# Patient Record
Sex: Female | Born: 1940 | Race: White | Hispanic: No | Smoking: Former smoker
Health system: Southern US, Community
[De-identification: ages and names within clinical notes are randomized; demographics above are authoritative.]

## PROBLEM LIST (undated history)

## (undated) DIAGNOSIS — H8113 Benign paroxysmal vertigo, bilateral: Secondary | ICD-10-CM

## (undated) DIAGNOSIS — R519 Headache, unspecified: Secondary | ICD-10-CM

## (undated) DIAGNOSIS — F32A Depression, unspecified: Secondary | ICD-10-CM

## (undated) DIAGNOSIS — I639 Cerebral infarction, unspecified: Secondary | ICD-10-CM

## (undated) DIAGNOSIS — J45909 Unspecified asthma, uncomplicated: Secondary | ICD-10-CM

## (undated) DIAGNOSIS — E039 Hypothyroidism, unspecified: Secondary | ICD-10-CM

## (undated) DIAGNOSIS — N189 Chronic kidney disease, unspecified: Secondary | ICD-10-CM

## (undated) DIAGNOSIS — G4700 Insomnia, unspecified: Secondary | ICD-10-CM

## (undated) DIAGNOSIS — F329 Major depressive disorder, single episode, unspecified: Secondary | ICD-10-CM

## (undated) DIAGNOSIS — M1990 Unspecified osteoarthritis, unspecified site: Secondary | ICD-10-CM

## (undated) DIAGNOSIS — K259 Gastric ulcer, unspecified as acute or chronic, without hemorrhage or perforation: Secondary | ICD-10-CM

## (undated) DIAGNOSIS — M722 Plantar fascial fibromatosis: Secondary | ICD-10-CM

## (undated) DIAGNOSIS — M549 Dorsalgia, unspecified: Secondary | ICD-10-CM

## (undated) DIAGNOSIS — E559 Vitamin D deficiency, unspecified: Secondary | ICD-10-CM

## (undated) HISTORY — DX: Insomnia, unspecified: G47.00

## (undated) HISTORY — PX: BACK SURGERY: SHX140

## (undated) HISTORY — DX: Major depressive disorder, single episode, unspecified: F32.9

## (undated) HISTORY — DX: Plantar fascial fibromatosis: M72.2

## (undated) HISTORY — DX: Hypothyroidism, unspecified: E03.9

## (undated) HISTORY — DX: Depression, unspecified: F32.A

## (undated) HISTORY — DX: Cerebral infarction, unspecified: I63.9

## (undated) HISTORY — DX: Vitamin D deficiency, unspecified: E55.9

## (undated) HISTORY — PX: APPENDECTOMY: SHX54

---
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
## 1972-12-10 HISTORY — PX: ABDOMINAL HYSTERECTOMY: SHX81

## 1999-01-23 ENCOUNTER — Other Ambulatory Visit: Admission: RE | Admit: 1999-01-23 | Discharge: 1999-01-23 | Payer: Self-pay | Admitting: *Deleted

## 1999-12-16 ENCOUNTER — Encounter (INDEPENDENT_AMBULATORY_CARE_PROVIDER_SITE_OTHER): Payer: Self-pay | Admitting: *Deleted

## 1999-12-17 ENCOUNTER — Inpatient Hospital Stay (HOSPITAL_COMMUNITY): Admission: EM | Admit: 1999-12-17 | Discharge: 1999-12-20 | Payer: Self-pay | Admitting: Podiatry

## 2000-06-25 ENCOUNTER — Other Ambulatory Visit: Admission: RE | Admit: 2000-06-25 | Discharge: 2000-06-25 | Payer: Self-pay | Admitting: *Deleted

## 2001-12-10 HISTORY — PX: HAND SURGERY: SHX662

## 2005-06-11 ENCOUNTER — Inpatient Hospital Stay (HOSPITAL_COMMUNITY): Admission: EM | Admit: 2005-06-11 | Discharge: 2005-06-13 | Payer: Self-pay | Admitting: Emergency Medicine

## 2005-12-10 HISTORY — PX: SHOULDER ARTHROSCOPY: SHX128

## 2006-11-13 ENCOUNTER — Emergency Department (HOSPITAL_COMMUNITY): Admission: EM | Admit: 2006-11-13 | Discharge: 2006-11-13 | Payer: Self-pay | Admitting: Emergency Medicine

## 2007-10-20 ENCOUNTER — Other Ambulatory Visit: Admission: RE | Admit: 2007-10-20 | Discharge: 2007-10-20 | Payer: Self-pay | Admitting: *Deleted

## 2007-12-11 HISTORY — PX: PLANTAR FASCIA SURGERY: SHX746

## 2008-07-29 ENCOUNTER — Encounter: Admission: RE | Admit: 2008-07-29 | Discharge: 2008-07-29 | Payer: Self-pay | Admitting: Orthopedic Surgery

## 2010-12-17 ENCOUNTER — Emergency Department (HOSPITAL_COMMUNITY)
Admission: EM | Admit: 2010-12-17 | Discharge: 2010-12-18 | Payer: Self-pay | Source: Home / Self Care | Admitting: Emergency Medicine

## 2011-02-06 ENCOUNTER — Encounter (HOSPITAL_BASED_OUTPATIENT_CLINIC_OR_DEPARTMENT_OTHER)
Admission: RE | Admit: 2011-02-06 | Discharge: 2011-02-06 | Disposition: A | Discharge status: Home / Self Care | Payer: Medicare Other | Source: Ambulatory Visit | Attending: Orthopedic Surgery | Admitting: Orthopedic Surgery

## 2011-02-07 ENCOUNTER — Ambulatory Visit (HOSPITAL_BASED_OUTPATIENT_CLINIC_OR_DEPARTMENT_OTHER)
Admission: RE | Admit: 2011-02-07 | Discharge: 2011-02-07 | Disposition: A | Discharge status: Home / Self Care | Payer: Medicare Other | Source: Ambulatory Visit | Attending: Orthopedic Surgery | Admitting: Orthopedic Surgery

## 2011-02-07 DIAGNOSIS — Z472 Encounter for removal of internal fixation device: Secondary | ICD-10-CM | POA: Insufficient documentation

## 2011-02-07 DIAGNOSIS — Z01812 Encounter for preprocedural laboratory examination: Secondary | ICD-10-CM | POA: Insufficient documentation

## 2011-02-07 DIAGNOSIS — Z01818 Encounter for other preprocedural examination: Secondary | ICD-10-CM | POA: Insufficient documentation

## 2011-02-07 LAB — POCT HEMOGLOBIN-HEMACUE: Hemoglobin: 13.5 g/dL (ref 12.0–15.0)

## 2011-03-07 NOTE — Op Note (Signed)
  Leslie [name], STOHR             ACCOUNT NO.:  [id number]  MEDICAL RECORD NO.:  [id number]          PATIENT TYPE:  LOCATION:                                 FACILITY:  PHYSICIAN:  Artist Pais. Fortunata [name], M.D.DATE OF BIRTH:  1941-11-02  DATE OF PROCEDURE:  02/07/2011 DATE OF DISCHARGE:                              OPERATIVE REPORT   PREOPERATIVE DIAGNOSIS:  Chronic pain, status post fall with retained hardware left fifth metacarpal base.  POSTOPERATIVE DIAGNOSIS:  Chronic pain, status post fall with retained hardware left fifth metacarpal base.  PROCEDURE:  Hardware removal left fifth metacarpal.  SURGEON:  Tanisa Lagace A. Mina [name], MD  ASSISTANT:  None.  ANESTHESIA:  General.  TOURNIQUET TIME:  36 minutes.  No complications.  No drains.  The patient was taken to the operating suite after induction of adequate general anesthesia, left upper extremity was prepped and draped in usual sterile fashion.  An Esmarch was used to exsanguinate the limb. Tourniquet was then inflated to 250 mmHg.  At this point time, incision was made longitudinally over the fifth metacarpal base of the [facility] joint left side, the skin was incised.  The fifth dorsal compartment tendons were carefully retracted with a small leather sheath incised. Dissection was carried down to the base of fifth metacarpal. Fluoroscopy was used to identify the insertion point of the IM rod, it was removed.  The wound was irrigated and loosely closed in layers of 4- 0 Vicryl for the retinaculum over the fifth dorsal compartment and the capsule, and 5-0 nylon suture for the skin.  Xeroform, 4x4s, and compressive dressing were applied.  The patient tolerated the procedure well and went to recovery room in stable condition.     Artist Pais Mina [name], [name]     MAW/MEDQ  D:  02/07/2011  T:  02/07/2011  Job:  [id number]  Electronically Signed by Dairl Ponder [name] on 03/07/2011 11:27:37 AM

## 2011-04-27 NOTE — Op Note (Signed)
NAMEAYLENE, ACOFF             ACCOUNT NO.:  [id number]   MEDICAL RECORD NO.:  [id number]          PATIENT TYPE:  INP   LOCATION:  5025                         FACILITY:  [facility]   PHYSICIAN:  Artist Pais. Weingold, M.D.DATE OF BIRTH:  07-17-1941   DATE OF PROCEDURE:  06/11/2005  DATE OF DISCHARGE:  06/13/2005                                 OPERATIVE REPORT   PREOPERATIVE DIAGNOSIS:  Left hand fifth metacarpal open fracture.   POSTOPERATIVE DIAGNOSIS:  Left hand fifth metacarpal open fracture.   PROCEDURE:  I&D and IM rodding above with 1.6 mm IM rod.   SURGEON:  Artist Pais. Mina [name], MD.   ASSISTANT:  None.   ANESTHESIA:  General.   TOURNIQUET TIME:  20 minutes.   CULTURES:  Cultures x2 were sent.   BLOOD LOSS:  None.   DESCRIPTION OF PROCEDURE:  Patient was taken to the operating room.  After  the induction of adequate general anesthesia, the left upper extremity was  prepped and draped in the usual sterile fashion.  An Esmarch was used to  exsanguinate the limb.  The tourniquet was inflated to 250 mmHg.  At this  point in time, the left metacarpophalangeal joint area was irrigated and  debrided secondary to a dog bite and open fracture.  Cultures were taken.  The wound was thoroughly irrigated.  A second incision was made at the base  of the fifth metacarpal.  A small cortical window was opened.  The IM rod  was then driven from proximal to distal across the fracture site under  direct and fluoroscopic guidance.  The wire was cut below the skin and bent  upon itself.  Both wound were thoroughly irrigated and then loosely closed  with 5-0 nylon.  A sterile dressing of Xeroform, 4x4s, and an ulnar-volar  splint.  The patient tolerated the procedure well.      Artist Pais Mina [name], [name]  Electronically Signed     MAW/MEDQ  D:  11/20/2005  T:  11/20/2005  Job:  [id number]

## 2011-04-27 NOTE — Discharge Summary (Signed)
[place]  Patient:    Leslie [name]                   MRN: [id number] Adm. Date:  [id number] Disc. Date: [id number] Attending:  Anastasio Auerbach Dictator:   Anastasio Auerbach, [name] CC:         Roosvelt Harps, [name]             Miguel Aschoff, [name]                           Discharge Summary  DATE OF BIRTH:  1941/03/26  DISCHARGE DIAGNOSES: 1.  Prepyloric ulcer. 2.  Acute GI bleed secondary to #1. 3.  Anemia secondary to #1.     a.  Discharge hemoglobin 9.3. 4.  Hypothyroidism. 5.  Depression. 6.  Postmenopausal.     a.  Hysterectomy secondary to endometriosis. 7.  Status post appendectomy. 8.  History of transfusion with back surgeries. 9.  History of fall from a ladder with back surgeries and fusion. 10. History of CVA/TIA, 1997.     a.  Residual right hand clumsy, ? 11. Macular degeneration. 12. Contact dermatitis secondary to tape. 13. Allergy:  Ultram (disorientation).  DISCHARGE MEDICATIONS: 1.  Protinex 40 mg q.d. x 30 days. 2.  Phenergan 12.5 mg 1/2 tablet q.6h. p.r.n. nausea; dispensed #5 with no     refill. 3.  Continue old medications as follows:     a.  Synthroid 0.088 mg qd     b.  Premarin 0.625 mg q.d.     c.  Zoloft 50 mg q.d.     d.  Tylenol as-needed for pain.  * Do not use aspirin or ibuprofen like products as they can irritate the ulcer.  DISCHARGE CONDITION:  Stable.  Orthostatics lying 123/56 94, sitting 116/84 95,  standing 116/53 96 (asymptomatic).  ACTIVITY:  Recommended activity as tolerated, rest when tired.  DIET:  Recommended diet as tolerated.  Soft foods for the next couple of days.  SPECIAL INSTRUCTIONS:  The patient is to contact her physicians if she has any similar symptoms to this presentation.  That included severe nausea, dizziness ith standing, and black tarry stools.  DISPOSITION:   The patient has an appointment to see Dr. Miguel Aschoff on Monday, December 25, 1999, at 2:00 p.m.  At that visit  she will need her hemoglobin rechecked.  She will also need to be started on iron therapy.  Her stroke prophylaxis medication can be addressed as she will not be allowed to take aspirin at this time.  CONSULTANTS:  Roosvelt Harps, [name]  PROCEDURES: 1.  EGD (December 17, 1999):  Non-bleeding prepyloric ulcer.  HOSPITAL COURSE: #1 - GI BLEED:  This 70 year old Caucasian woman presents with a two to three week history of fatigue and a one day history of nausea and vomiting and associated orthostasis.  She subsequently developed black tarry stools.  She presented to he emergency department and indeed was noted to have guaiac positive black stools.  Her hemoglobin on presentation was 11.3.  She denied any significant abdominal pain.  Gastroenterology was consulted.  Endoscopy was performed, which demonstrated a non-bleeding prepyloric ulcer.  Her aspirin was discontinued and she was started on IV Pepcid, which was subsequently changed to Protinex.  Her course was somewhat rocky in that she remained significantly orthostatic and did have her hemoglobin drop to a low of 8.9.  On the  day of discharge it had stabilized at 9.3.  She did not require transfusion this admission.  A clo test was done which showed no evidence of urea and pathology did not demonstrate any Helicobacter pylori. She will contact Dr. Luther Parody if she has any signs of further bleeding.  He instructed her to avoid aspirin.  #2 - HISTORY OF STROKE VERSUS TIA:  This occurred four years ago.  She apparently had some right hand clumsiness.  At this point she cannot be anticoagulated with aspirin.  I do not know the details of her - imaging that was done.  I will defer further investigation into this to her primary care physician, Dr. Miguel Aschoff. DD:  12/20/99 TD:  12/20/99 Job: [id number]

## 2011-04-27 NOTE — Consult Note (Signed)
NAMENAILEA, WHITEHORN             ACCOUNT NO.:  [id number]   MEDICAL RECORD NO.:  [id number]          PATIENT TYPE:  INP   LOCATION:  1828                         FACILITY:  [facility]   PHYSICIAN:  Artist Pais. Weingold, M.D.DATE OF BIRTH:  1941-10-30   DATE OF CONSULTATION:  06/11/2005  DATE OF DISCHARGE:                                   CONSULTATION   REASON FOR CONSULTATION:  Ms. Jacqulyne Gladue is a 70 year old right hand  dominant female who was involved in breaking up her dogs in a dog fight on  this past Friday and presents today with pain and swelling.  An x-ray of  this shows a fracture of the head and neck junction of the fifth metacarpal  drainage and despite being on Augmentin for the past 24 hours.  She is an,  otherwise, healthy 70 year old female with an allergy to AcipHex as well as  having had surgery in the past x 4 on her back and foot surgery as a child.  She also has an allergy to Ultram.  She is currently on Synthroid, Premarin,  and Zoloft.  No significant past medical or surgical history is, otherwise,  noted or family history, etc.   PHYSICAL EXAMINATION:  Well developed, well nourished female, pleasant,  alert and oriented x 3.  Examination of her hand on the left shows pain and  swelling over the metacarpal phalangeal joint of the index, long, ring, and  small fingers, and mostly over the little finger where she has an entrance  wound with some serous type drainage.  She can make a fist.  She does have  pain and swelling which has gotten worse over the 24 hours despite  Augmentin.  She denies numbness and tingling.  Right hand is normal by  comparison.   X-rays show a fracture of the fifth metacarpal head and neck junction with  apex dorsal angulation.   ASSESSMENT:  70 year old female with a dog bite wound to her left hand with  an open fracture of the fifth metacarpal head and neck junction now with  increased swelling and pain despite p.o. antibiotics.   RECOMMENDATIONS:  We had a thorough discussion regarding treatment options.  I recommended we take her to the operating room and we do an incision and  drainage, irrigate the fracture site out completely, affix it with an IM rod  from proximal to distal, and admit her for IV antibiotics for the next 24-48  hours.  The patient understands the risks and benefits of the procedure and  wishes to proceed and will be taken to the operating room emergently for I&D  and IM rodding of fifth metacarpal fracture, left hand.       MAW/MEDQ  D:  06/11/2005  T:  06/11/2005  Job:  [id number]

## 2011-04-27 NOTE — Procedures (Signed)
[place]  Patient:    Leslie [name]                   MRN: [id number] Proc. Date: 12/17/99 Adm. Date:  [id number] Attending:  Lanell Persons CC:         Vikki Ports, [name]                           Procedure Report  PROCEDURE:  Video upper endoscopy.  INDICATIONS:  A 70 year old female with melena and vomiting and vague epigastric discomfort.  PREPARATION:  She was n.p.o.  PREPROCEDURE SEDATION:  She received a total of 40 mg of Demerol and 6 mg of Versed intravenously.  [name], she was on 2 L of nasal cannula O2, and her throat was anesthetized with Hurricaine spray.  DESCRIPTION OF PROCEDURE:  The Olympus video upper endoscope was inserted via the mouth and through the upper esophageal sphincter with ease.  Intubation was then carried out well into the descending duodenum.  On withdrawal, the mucosa was carefully evaluated.  The duodenum and bulb appeared normal.  Within the prepyloric area was a 1 cm non-bleeding ulcer with surrounding gastritis.  The area was biopsied for CLO and histology.  The remainder of the stomach appeared normal.  Retroflexed view of the GE junction was unremarkable, and the distal esophagus appeared normal.  The patient tolerated the procedure well.  Pulse, blood pressure, and oximetry testing were stable throughout.  She was observed in recovery for 5 minutes and returned to the emergency room.  IMPRESSION:  Currently non-bleeding prepyloric gastric ulcer.  PLAN:  Check the CLO test and histology and treat accordingly.  I am placing her on p.o. Protonix.  She may start clear liquids.  Serial hemoglobins will be checked, and she should be observed in the hospital until stable. DD:  12/17/99 TD:  12/17/99 Job: [id number]

## 2011-04-27 NOTE — Consult Note (Signed)
[place]  Patient:    Leslie [name]                   MRN: [id number] Proc. Date: 12/17/99 Adm. Date:  [id number] Attending:  Lanell Persons Dictator:   Roosvelt Harps, [name] CC:         Vikki Ports, [name]                          Consultation Report  HISTORY OF PRESENT ILLNESS:  [name] is a 70 year old female in generally  good health who has been feeling somewhat run down in the last few weeks. Yesterday on December 16, 1999 she began having nausea and vomiting with sensation of extreme diaphoresis, nausea, and weakness on sitting or standing upright. She subsequently had a few episodes of melena.  She has had some vague epigastric discomfort but has not been paying much attention to it.  She has never previously had any gastrointestinal bleeding or history of ulcer disease.  She takes one aspirin a day, generally a baby aspirin but recently an adult aspirin as prophylaxis for a TIA that she had a few years ago.  [name], when she has sinus problems she takes an adult aspirin, which she has been doing recently. he does not drink any alcohol or use any other non-steroidals.  She denies heartburn, dysphagia, weight loss, or lower abdominal pain.  PAST MEDICAL HISTORY:  Pertinent for an appendectomy and a hysterectomy for endometriosis.  She also has a history of a fall from a ladder in the 1980s resulting in fractured spine and four spinal fusions.  As noted, she had a TIA r small CVA with minimal right hand clumsiness resulting.  She also has a diagnosis of macular degeneration.  CURRENT MEDICATIONS:  Include Premarin, Zoloft, Synthroid, and aspirin.  ALLERGIES:  Ultram effects her cognitive abilities.  FAMILY HISTORY:  Negative for ulcers, gallstones, inflammatory bowel disease, or gastrointestinal neoplasia.  She says that one brother has had pancreatic problems and a sister has had irritable  bowel.  SOCIAL HISTORY:  She is married.  She does not drink alcohol or smoke.  She is n disability secondary to her spinal problem.  REVIEW OF SYSTEMS:  GENERAL:  No weight loss or night sweats.  ENDOCRINE:  Positive for hypothyroidism.  No history of diabetes.  SKIN:  No rash or pruritus.  EYES:  No icterus or change in vision.  ENT:  No aphthous ulcers or chronic sore throat.  RESPIRATORY:  No shortness of breath, cough, or wheezing.  CARDIAC:  No chest pain, palpitations, or history of valvular heart disease.  GI:  As above.  GU:  Reported past history of microscopic hematuria.  The remainder of the Review Of Systems is negative.  PHYSICAL EXAMINATION:  GENERAL:  She is a well-developed, well-nourished, mildly overweight adult female, in no acute distress.  VITAL SIGNS:  Afebrile, with a blood pressure of 126/80.  Pulse is 80.  SKIN:  Normal.  HEENT:  Eyes anicteric.  Oropharynx unremarkable without aphthous ulcerations.  NECK:  Supple without thyromegaly.  No cervical or inguinal adenopathy.  CHEST:  Clear to auscultation and percussion.  HEART:  Heart sounds are regular rate and rhythm without murmurs, rubs, or gallops.  ABDOMEN:  Mildly obese without mass, tenderness, or organomegaly.  Normal bowel  sounds.  No rebound or hernia.  RECTAL:  Not performed but reported by the ER physician  as showing melena.  EXTREMITIES:  Without edema.  Dorsalis pedis pulses are 2+ bilaterally.  LABORATORY DATA:  Current hemoglobin 10.7, dropping from 11.3 with hydration. BC 14.1, platelet count 352,000.  PT normal at 13.8.  BUN mildly elevated at 33 with a creatinine of 0.9.  Liver function tests are normal.  IMPRESSION: 23.  A 70 year old female with acute mild upper GI bleed, possibly related to ulcer     disease induced by aspirin or a Mallory-Weiss tear from vomiting.  PLAN:  Upper endoscopy is in order and will be performed later this morning. Further  recommendations will follow the conclusion of that procedure. DD:  12/17/99 TD:  12/17/99 Job: [id number]

## 2012-10-15 DIAGNOSIS — Z23 Encounter for immunization: Secondary | ICD-10-CM | POA: Diagnosis not present

## 2012-11-03 DIAGNOSIS — J45909 Unspecified asthma, uncomplicated: Secondary | ICD-10-CM | POA: Diagnosis not present

## 2012-11-25 DIAGNOSIS — E039 Hypothyroidism, unspecified: Secondary | ICD-10-CM | POA: Diagnosis not present

## 2012-11-25 DIAGNOSIS — Z13820 Encounter for screening for osteoporosis: Secondary | ICD-10-CM | POA: Diagnosis not present

## 2012-11-25 DIAGNOSIS — Z1331 Encounter for screening for depression: Secondary | ICD-10-CM | POA: Diagnosis not present

## 2012-11-25 DIAGNOSIS — Z79899 Other long term (current) drug therapy: Secondary | ICD-10-CM | POA: Diagnosis not present

## 2012-11-25 DIAGNOSIS — Z23 Encounter for immunization: Secondary | ICD-10-CM | POA: Diagnosis not present

## 2012-12-18 DIAGNOSIS — Z13820 Encounter for screening for osteoporosis: Secondary | ICD-10-CM | POA: Diagnosis not present

## 2012-12-19 DIAGNOSIS — J45909 Unspecified asthma, uncomplicated: Secondary | ICD-10-CM | POA: Diagnosis not present

## 2013-01-12 DIAGNOSIS — J069 Acute upper respiratory infection, unspecified: Secondary | ICD-10-CM | POA: Diagnosis not present

## 2013-09-21 DIAGNOSIS — M7750 Other enthesopathy of unspecified foot: Secondary | ICD-10-CM | POA: Diagnosis not present

## 2013-09-29 DIAGNOSIS — M7750 Other enthesopathy of unspecified foot: Secondary | ICD-10-CM | POA: Diagnosis not present

## 2013-10-01 DIAGNOSIS — M7750 Other enthesopathy of unspecified foot: Secondary | ICD-10-CM | POA: Diagnosis not present

## 2013-10-06 DIAGNOSIS — M7750 Other enthesopathy of unspecified foot: Secondary | ICD-10-CM | POA: Diagnosis not present

## 2013-10-08 DIAGNOSIS — M7750 Other enthesopathy of unspecified foot: Secondary | ICD-10-CM | POA: Diagnosis not present

## 2013-10-09 DIAGNOSIS — M7750 Other enthesopathy of unspecified foot: Secondary | ICD-10-CM | POA: Diagnosis not present

## 2013-10-12 DIAGNOSIS — M7750 Other enthesopathy of unspecified foot: Secondary | ICD-10-CM | POA: Diagnosis not present

## 2013-10-13 DIAGNOSIS — Z23 Encounter for immunization: Secondary | ICD-10-CM | POA: Diagnosis not present

## 2013-10-14 DIAGNOSIS — M7750 Other enthesopathy of unspecified foot: Secondary | ICD-10-CM | POA: Diagnosis not present

## 2013-10-16 DIAGNOSIS — M7750 Other enthesopathy of unspecified foot: Secondary | ICD-10-CM | POA: Diagnosis not present

## 2013-11-09 DIAGNOSIS — Z1331 Encounter for screening for depression: Secondary | ICD-10-CM | POA: Diagnosis not present

## 2013-11-09 DIAGNOSIS — F329 Major depressive disorder, single episode, unspecified: Secondary | ICD-10-CM | POA: Diagnosis not present

## 2013-11-09 DIAGNOSIS — E039 Hypothyroidism, unspecified: Secondary | ICD-10-CM | POA: Diagnosis not present

## 2013-11-09 DIAGNOSIS — Z0000 Encounter for general adult medical examination without abnormal findings: Secondary | ICD-10-CM | POA: Diagnosis not present

## 2013-11-09 DIAGNOSIS — Z79899 Other long term (current) drug therapy: Secondary | ICD-10-CM | POA: Diagnosis not present

## 2013-11-09 DIAGNOSIS — F3289 Other specified depressive episodes: Secondary | ICD-10-CM | POA: Diagnosis not present

## 2013-11-09 DIAGNOSIS — E785 Hyperlipidemia, unspecified: Secondary | ICD-10-CM | POA: Diagnosis not present

## 2013-11-09 DIAGNOSIS — G479 Sleep disorder, unspecified: Secondary | ICD-10-CM | POA: Diagnosis not present

## 2013-11-11 DIAGNOSIS — J309 Allergic rhinitis, unspecified: Secondary | ICD-10-CM | POA: Diagnosis not present

## 2013-11-11 DIAGNOSIS — J45909 Unspecified asthma, uncomplicated: Secondary | ICD-10-CM | POA: Diagnosis not present

## 2013-12-10 HISTORY — PX: CATARACT EXTRACTION, BILATERAL: SHX1313

## 2013-12-10 HISTORY — PX: OTHER SURGICAL HISTORY: SHX169

## 2013-12-25 DIAGNOSIS — F329 Major depressive disorder, single episode, unspecified: Secondary | ICD-10-CM | POA: Diagnosis not present

## 2013-12-25 DIAGNOSIS — G479 Sleep disorder, unspecified: Secondary | ICD-10-CM | POA: Diagnosis not present

## 2013-12-25 DIAGNOSIS — F3289 Other specified depressive episodes: Secondary | ICD-10-CM | POA: Diagnosis not present

## 2014-01-03 DIAGNOSIS — J209 Acute bronchitis, unspecified: Secondary | ICD-10-CM | POA: Diagnosis not present

## 2014-01-18 DIAGNOSIS — H25019 Cortical age-related cataract, unspecified eye: Secondary | ICD-10-CM | POA: Diagnosis not present

## 2014-01-18 DIAGNOSIS — H2510 Age-related nuclear cataract, unspecified eye: Secondary | ICD-10-CM | POA: Diagnosis not present

## 2014-01-18 DIAGNOSIS — H25049 Posterior subcapsular polar age-related cataract, unspecified eye: Secondary | ICD-10-CM | POA: Diagnosis not present

## 2014-01-18 DIAGNOSIS — H18419 Arcus senilis, unspecified eye: Secondary | ICD-10-CM | POA: Diagnosis not present

## 2014-01-18 DIAGNOSIS — H00029 Hordeolum internum unspecified eye, unspecified eyelid: Secondary | ICD-10-CM | POA: Diagnosis not present

## 2014-01-29 DIAGNOSIS — M7750 Other enthesopathy of unspecified foot: Secondary | ICD-10-CM | POA: Diagnosis not present

## 2014-02-05 DIAGNOSIS — M7750 Other enthesopathy of unspecified foot: Secondary | ICD-10-CM | POA: Diagnosis not present

## 2014-02-24 DIAGNOSIS — S86819A Strain of other muscle(s) and tendon(s) at lower leg level, unspecified leg, initial encounter: Secondary | ICD-10-CM | POA: Diagnosis not present

## 2014-02-24 DIAGNOSIS — M7750 Other enthesopathy of unspecified foot: Secondary | ICD-10-CM | POA: Diagnosis not present

## 2014-02-24 DIAGNOSIS — S838X9A Sprain of other specified parts of unspecified knee, initial encounter: Secondary | ICD-10-CM | POA: Diagnosis not present

## 2014-03-02 DIAGNOSIS — H269 Unspecified cataract: Secondary | ICD-10-CM | POA: Diagnosis not present

## 2014-03-02 DIAGNOSIS — Z961 Presence of intraocular lens: Secondary | ICD-10-CM | POA: Diagnosis not present

## 2014-03-02 DIAGNOSIS — H2510 Age-related nuclear cataract, unspecified eye: Secondary | ICD-10-CM | POA: Diagnosis not present

## 2014-03-09 DIAGNOSIS — H269 Unspecified cataract: Secondary | ICD-10-CM | POA: Diagnosis not present

## 2014-03-09 DIAGNOSIS — H2510 Age-related nuclear cataract, unspecified eye: Secondary | ICD-10-CM | POA: Diagnosis not present

## 2014-03-24 DIAGNOSIS — Z1231 Encounter for screening mammogram for malignant neoplasm of breast: Secondary | ICD-10-CM | POA: Diagnosis not present

## 2014-03-26 DIAGNOSIS — Z1231 Encounter for screening mammogram for malignant neoplasm of breast: Secondary | ICD-10-CM | POA: Diagnosis not present

## 2014-03-26 DIAGNOSIS — N6009 Solitary cyst of unspecified breast: Secondary | ICD-10-CM | POA: Diagnosis not present

## 2014-04-20 DIAGNOSIS — S96819A Strain of other specified muscles and tendons at ankle and foot level, unspecified foot, initial encounter: Secondary | ICD-10-CM | POA: Diagnosis not present

## 2014-04-20 DIAGNOSIS — M66879 Spontaneous rupture of other tendons, unspecified ankle and foot: Secondary | ICD-10-CM | POA: Diagnosis not present

## 2014-04-20 DIAGNOSIS — S93499A Sprain of other ligament of unspecified ankle, initial encounter: Secondary | ICD-10-CM | POA: Diagnosis not present

## 2014-04-20 DIAGNOSIS — M948X9 Other specified disorders of cartilage, unspecified sites: Secondary | ICD-10-CM | POA: Diagnosis not present

## 2014-04-20 DIAGNOSIS — M659 Synovitis and tenosynovitis, unspecified: Secondary | ICD-10-CM | POA: Diagnosis not present

## 2014-06-15 DIAGNOSIS — M25579 Pain in unspecified ankle and joints of unspecified foot: Secondary | ICD-10-CM | POA: Diagnosis not present

## 2014-06-17 DIAGNOSIS — M25579 Pain in unspecified ankle and joints of unspecified foot: Secondary | ICD-10-CM | POA: Diagnosis not present

## 2014-06-22 DIAGNOSIS — M25579 Pain in unspecified ankle and joints of unspecified foot: Secondary | ICD-10-CM | POA: Diagnosis not present

## 2014-06-28 DIAGNOSIS — M7150 Other bursitis, not elsewhere classified, unspecified site: Secondary | ICD-10-CM | POA: Diagnosis not present

## 2014-06-29 DIAGNOSIS — M25579 Pain in unspecified ankle and joints of unspecified foot: Secondary | ICD-10-CM | POA: Diagnosis not present

## 2014-07-01 DIAGNOSIS — M25579 Pain in unspecified ankle and joints of unspecified foot: Secondary | ICD-10-CM | POA: Diagnosis not present

## 2014-07-06 DIAGNOSIS — M25579 Pain in unspecified ankle and joints of unspecified foot: Secondary | ICD-10-CM | POA: Diagnosis not present

## 2014-07-08 DIAGNOSIS — M25579 Pain in unspecified ankle and joints of unspecified foot: Secondary | ICD-10-CM | POA: Diagnosis not present

## 2014-07-13 DIAGNOSIS — M25579 Pain in unspecified ankle and joints of unspecified foot: Secondary | ICD-10-CM | POA: Diagnosis not present

## 2014-07-15 DIAGNOSIS — M25579 Pain in unspecified ankle and joints of unspecified foot: Secondary | ICD-10-CM | POA: Diagnosis not present

## 2014-07-20 DIAGNOSIS — M25579 Pain in unspecified ankle and joints of unspecified foot: Secondary | ICD-10-CM | POA: Diagnosis not present

## 2014-07-22 DIAGNOSIS — M25579 Pain in unspecified ankle and joints of unspecified foot: Secondary | ICD-10-CM | POA: Diagnosis not present

## 2014-11-10 DIAGNOSIS — R5383 Other fatigue: Secondary | ICD-10-CM | POA: Diagnosis not present

## 2014-11-10 DIAGNOSIS — E039 Hypothyroidism, unspecified: Secondary | ICD-10-CM | POA: Diagnosis not present

## 2014-11-10 DIAGNOSIS — G4700 Insomnia, unspecified: Secondary | ICD-10-CM | POA: Diagnosis not present

## 2014-11-10 DIAGNOSIS — E785 Hyperlipidemia, unspecified: Secondary | ICD-10-CM | POA: Diagnosis not present

## 2014-11-10 DIAGNOSIS — R55 Syncope and collapse: Secondary | ICD-10-CM | POA: Diagnosis not present

## 2014-11-10 DIAGNOSIS — Z0000 Encounter for general adult medical examination without abnormal findings: Secondary | ICD-10-CM | POA: Diagnosis not present

## 2014-11-10 DIAGNOSIS — Z23 Encounter for immunization: Secondary | ICD-10-CM | POA: Diagnosis not present

## 2014-11-10 DIAGNOSIS — F411 Generalized anxiety disorder: Secondary | ICD-10-CM | POA: Diagnosis not present

## 2014-11-10 DIAGNOSIS — M8580 Other specified disorders of bone density and structure, unspecified site: Secondary | ICD-10-CM | POA: Diagnosis not present

## 2015-01-03 DIAGNOSIS — D124 Benign neoplasm of descending colon: Secondary | ICD-10-CM | POA: Diagnosis not present

## 2015-01-03 DIAGNOSIS — D126 Benign neoplasm of colon, unspecified: Secondary | ICD-10-CM | POA: Diagnosis not present

## 2015-01-03 DIAGNOSIS — K648 Other hemorrhoids: Secondary | ICD-10-CM | POA: Diagnosis not present

## 2015-01-03 DIAGNOSIS — Z1211 Encounter for screening for malignant neoplasm of colon: Secondary | ICD-10-CM | POA: Diagnosis not present

## 2015-01-03 DIAGNOSIS — D122 Benign neoplasm of ascending colon: Secondary | ICD-10-CM | POA: Diagnosis not present

## 2015-01-20 DIAGNOSIS — M8580 Other specified disorders of bone density and structure, unspecified site: Secondary | ICD-10-CM | POA: Diagnosis not present

## 2015-02-07 DIAGNOSIS — J4540 Moderate persistent asthma, uncomplicated: Secondary | ICD-10-CM | POA: Diagnosis not present

## 2015-02-07 DIAGNOSIS — J309 Allergic rhinitis, unspecified: Secondary | ICD-10-CM | POA: Diagnosis not present

## 2015-02-07 DIAGNOSIS — E559 Vitamin D deficiency, unspecified: Secondary | ICD-10-CM | POA: Diagnosis not present

## 2015-03-11 DIAGNOSIS — Z23 Encounter for immunization: Secondary | ICD-10-CM | POA: Diagnosis not present

## 2015-03-11 DIAGNOSIS — M722 Plantar fascial fibromatosis: Secondary | ICD-10-CM | POA: Diagnosis not present

## 2015-04-25 DIAGNOSIS — M76811 Anterior tibial syndrome, right leg: Secondary | ICD-10-CM | POA: Diagnosis not present

## 2015-04-25 DIAGNOSIS — M722 Plantar fascial fibromatosis: Secondary | ICD-10-CM | POA: Diagnosis not present

## 2015-04-27 DIAGNOSIS — Z1231 Encounter for screening mammogram for malignant neoplasm of breast: Secondary | ICD-10-CM | POA: Diagnosis not present

## 2015-09-19 DIAGNOSIS — H524 Presbyopia: Secondary | ICD-10-CM | POA: Diagnosis not present

## 2015-09-19 DIAGNOSIS — Z961 Presence of intraocular lens: Secondary | ICD-10-CM | POA: Diagnosis not present

## 2015-09-19 DIAGNOSIS — H5211 Myopia, right eye: Secondary | ICD-10-CM | POA: Diagnosis not present

## 2015-09-19 DIAGNOSIS — H04123 Dry eye syndrome of bilateral lacrimal glands: Secondary | ICD-10-CM | POA: Diagnosis not present

## 2015-09-19 DIAGNOSIS — H52223 Regular astigmatism, bilateral: Secondary | ICD-10-CM | POA: Diagnosis not present

## 2015-09-19 DIAGNOSIS — H179 Unspecified corneal scar and opacity: Secondary | ICD-10-CM | POA: Diagnosis not present

## 2015-11-14 DIAGNOSIS — J309 Allergic rhinitis, unspecified: Secondary | ICD-10-CM | POA: Diagnosis not present

## 2015-11-14 DIAGNOSIS — Z0000 Encounter for general adult medical examination without abnormal findings: Secondary | ICD-10-CM | POA: Diagnosis not present

## 2015-11-14 DIAGNOSIS — R42 Dizziness and giddiness: Secondary | ICD-10-CM | POA: Diagnosis not present

## 2015-11-14 DIAGNOSIS — Z23 Encounter for immunization: Secondary | ICD-10-CM | POA: Diagnosis not present

## 2015-11-14 DIAGNOSIS — E559 Vitamin D deficiency, unspecified: Secondary | ICD-10-CM | POA: Diagnosis not present

## 2015-11-14 DIAGNOSIS — F411 Generalized anxiety disorder: Secondary | ICD-10-CM | POA: Diagnosis not present

## 2015-11-14 DIAGNOSIS — Z131 Encounter for screening for diabetes mellitus: Secondary | ICD-10-CM | POA: Diagnosis not present

## 2015-11-14 DIAGNOSIS — E785 Hyperlipidemia, unspecified: Secondary | ICD-10-CM | POA: Diagnosis not present

## 2015-11-14 DIAGNOSIS — J45909 Unspecified asthma, uncomplicated: Secondary | ICD-10-CM | POA: Diagnosis not present

## 2015-11-14 DIAGNOSIS — E039 Hypothyroidism, unspecified: Secondary | ICD-10-CM | POA: Diagnosis not present

## 2015-12-23 DIAGNOSIS — E039 Hypothyroidism, unspecified: Secondary | ICD-10-CM | POA: Diagnosis not present

## 2015-12-23 DIAGNOSIS — G459 Transient cerebral ischemic attack, unspecified: Secondary | ICD-10-CM | POA: Diagnosis not present

## 2015-12-28 ENCOUNTER — Encounter: Payer: Self-pay | Admitting: Neurology

## 2015-12-28 ENCOUNTER — Ambulatory Visit (INDEPENDENT_AMBULATORY_CARE_PROVIDER_SITE_OTHER): Payer: Medicare Other | Admitting: Neurology

## 2015-12-28 VITALS — BP 152/80 | HR 70 | Resp 16 | Ht 67.5 in | Wt 205.0 lb

## 2015-12-28 DIAGNOSIS — R002 Palpitations: Secondary | ICD-10-CM | POA: Diagnosis not present

## 2015-12-28 DIAGNOSIS — R4702 Dysphasia: Secondary | ICD-10-CM | POA: Diagnosis not present

## 2015-12-28 DIAGNOSIS — R079 Chest pain, unspecified: Secondary | ICD-10-CM

## 2015-12-28 DIAGNOSIS — Z8673 Personal history of transient ischemic attack (TIA), and cerebral infarction without residual deficits: Secondary | ICD-10-CM | POA: Diagnosis not present

## 2015-12-28 NOTE — Progress Notes (Signed)
Subjective:    Leslie [name] ID: Leslie Leslie [name] is a 75 y.o. female.  HPI     Leslie Age, MD, PhD [facility] [address], [address] [place], [place] [address]  Dear Leslie Leslie [name] and Dr. Megan [name],   I saw your Leslie [name], Leslie Leslie [name], upon your kind request in my neurologic clinic today for initial consultation of a concern for TIA. Leslie Leslie [name] is unaccompanied today. As you know, Leslie Leslie [name] is a 75 year old right-handed woman with an underlying medical history of asthma, allergies, hypothyroidism, prior TIA in 2001, macular degeneration, vitamin D deficiency, obesity, neuropathy, plantar fasciitis, and reflux disease, who reports an episode of dizziness and slurring of speech on 12/22/2015. Leslie Leslie [name] was at Leslie grocery store and also had some confusion.  Her speech difficulties are described as knowing what Leslie Leslie [name] wants to say but it would not come out. This lasted about 40 minutes. Leslie Leslie [name] did not seek immediate medical attention by going to Leslie emergency room. Leslie Leslie [name] had some left arm pain [name] and some left arm weakness. Leslie Leslie [name] was seen in your office Leslie next day on 12/23/2015. I reviewed your office note. Leslie Leslie [name] had blood work, including CBC with differential, CMP, PT, PTT, TSH, and EKG. You mentioned ordering carotid Doppler studies as well as a brain scan. Leslie Leslie [name] is currently on generic Zoloft 50 mg daily, generic Flonase twice daily, Zantac 150 mg once daily, Proventil, meclizine as needed twice a day, Astepro, levothyroxine 100 g daily, vitamin D 2000 units once a day. Leslie Leslie [name] is currently not on any aspirin or cholesterol medication. Leslie Leslie [name] had a bleeding ulcer and was told not to take ASA. Her lipids are  Usually borderline Leslie Leslie [name] says.  Leslie Leslie [name] has had some reflux symptoms but had an allergic reaction to PPIs.  Leslie Leslie [name] tried smoking many years ago but quit. Leslie Leslie [name] does not drink alcohol. Leslie Leslie [name] tries to hydrate well. Leslie Leslie [name] has heel pain bilaterally. Leslie Leslie [name] had tendinitis and also some surgeries to  both feet. This limits her exercise abilities. Leslie Leslie [name] tried swimming but Leslie Leslie [name] still felt that her foot pain exacerbated with swimming. Leslie Leslie [name] has had prior multiple back surgeries. This also limits her exercise ability and range of motion. Her vascular risk factors are prior TIA in 2001 and obesity and Leslie Leslie [name] was at [facility] [name], weakness on Leslie RUE. Leslie Leslie [name] was at home [name], husband called EMS. He died in 03-12-23.  I reviewed your office note from 12/23/2015, which you kindly included. Leslie Leslie [name] has had intermittent chest pains and some palpitations at times.  Her Past Medical History Is Significant For: Past Medical History  Diagnosis Date  . Stroke ([place])   . Plantar fasciitis   . Hypothyroidism   . Depression   . Insomnia   . Macular degeneration   . Vitamin D deficiency     Her Past Surgical History Is Significant For: Past Surgical History  Procedure Laterality Date  . L heel surgery for torn tendon    . Plantar fascia surgery Right   . Hysterotomy    . Hand surgery Left     x2  . Shoulder arthroscopy Right   . Appendectomy    . Back surgery      x4    Her Family History Is Significant For: Family History  Problem Relation [name] of Onset  . Hypertension Mother   . Lung cancer Mother   . Lung cancer Father   . Diabetes Sister   . Hypertension Sister   .  Congestive Heart Failure Sister   . Arthritis Brother     Her Social History Is Significant For: Social History   Social History  . Marital Status: Widowed    Spouse Name: N/A  . Number of Children: 1  . Years of Education: HS+   Occupational History  . Retired     Social History Main Topics  . Smoking status: Former Research scientist (life sciences)  . Smokeless tobacco: Not on file     Comment: Only smoked a few years  . Alcohol Use: No  . Drug Use: No  . Sexual Activity: Not on file   Other Topics Concern  . Not on file   Social History Narrative   Drinks about 3 cups of coffee a day     Her Allergies Are:  Allergies   Allergen Reactions  . Voltaren [Diclofenac Sodium] Shortness Of Breath  . Aspirin Other (See Comments)    ulcers  . Gabapentin Itching  . Ultram [Tramadol Hcl] Other (See Comments)    Effects cognitive abilities   :   Her Current Medications Are:  Outpatient Encounter Prescriptions as of 12/28/2015  Medication Sig  . albuterol (PROVENTIL HFA;VENTOLIN HFA) 108 (90 Base) MCG/ACT inhaler Inhale into Leslie lungs every 6 (six) hours as needed for wheezing or shortness of breath.  . Cholecalciferol (VITAMIN D) 2000 units CAPS Take by mouth. Reported on 12/28/2015  . fluticasone (FLONASE) 50 MCG/ACT nasal spray   . levothyroxine (SYNTHROID, LEVOTHROID) 100 MCG tablet Take 112 mcg by mouth daily before breakfast.   . meclizine (ANTIVERT) 25 MG tablet Take 25 mg by mouth 3 (three) times daily as needed for dizziness.  . ranitidine (ZANTAC) 150 MG capsule Take 150 mg by mouth 2 (two) times daily. Reported on 12/28/2015  . sertraline (ZOLOFT) 50 MG tablet Take 50 mg by mouth daily.   No facility-administered encounter medications on file as of 12/28/2015.  :   Review of Systems:  Out of a complete 14 point review of systems, all are reviewed and negative with Leslie exception of these symptoms as listed below:   Review of Systems  Neurological:       Leslie [name] had TIA in 2001. States that this past Thursday had a headache. States that Leslie Leslie [name] tried to talk but could not get Leslie words out.     Objective:  Neurologic Exam  Physical Exam Physical Examination:   Filed Vitals:   12/28/15 1428  BP: 152/80  Pulse: 70  Resp: 16    General Examination: Leslie Leslie [name] is a very pleasant 75 y.o. female in no acute distress. Leslie Leslie [name] appears well-developed and well-nourished and very well groomed.   HEENT: Normocephalic, atraumatic, pupils are equal, round and reactive to light and accommodation. Funduscopic exam is normal with sharp disc margins noted.  Leslie Leslie [name] is status post bilateral cataract repairs. Extraocular  tracking is good without limitation to gaze excursion or nystagmus noted. Normal smooth pursuit is noted. Hearing is grossly intact. Tympanic membranes are clear bilaterally. Face is symmetric with normal facial animation and normal facial sensation. Speech is clear with no dysarthria noted. There is no hypophonia. There is no lip, neck/head, jaw or voice tremor. Neck is supple with full range of passive and active motion. There are no carotid bruits on auscultation. Oropharynx exam reveals: mild mouth dryness, adequate dental hygiene and mild airway crowding. Mallampati is class II. Tongue protrudes centrally and palate elevates symmetrically.  Chest: Clear to auscultation without wheezing, rhonchi or crackles noted.  Heart: S1+S2+0, regular  and normal without murmurs, rubs or gallops noted.   Abdomen: Soft, non-tender and non-distended with normal bowel sounds appreciated on auscultation.  Extremities: There is no pitting edema in Leslie distal lower extremities bilaterally. Pedal pulses are intact.  Skin: Warm and dry without trophic changes noted. There are  mild varicose veins , right more than left.  Musculoskeletal: exam reveals no obvious joint deformities, tenderness or joint swelling or erythema.   Neurologically:  Mental status: Leslie Leslie [name] is awake, alert and oriented in all 4 spheres. Her immediate and remote memory, attention, language skills and fund of knowledge are appropriate. There is no evidence of aphasia, agnosia, apraxia or anomia. Speech is clear with normal prosody and enunciation. Thought process is linear. Mood is normal and affect is normal.  Cranial nerves II - XII are as described above under HEENT exam. [name]: shoulder shrug is normal with equal shoulder height noted. Motor exam: Normal bulk, strength and tone is noted. There is no drift, tremor or rebound. Romberg is negative. Reflexes are 2+ throughout. Babinski: Toes are flexor bilaterally. Fine motor skills and  coordination: intact with normal finger taps, normal hand movements, normal rapid alternating patting, normal foot taps and normal foot agility.  Cerebellar testing: No dysmetria or intention tremor on finger to nose testing. Heel to shin is unremarkable bilaterally. There is no truncal or gait ataxia.  Sensory exam: intact to light touch, pinprick, vibration, temperature sense in Leslie upper and lower extremities , but Leslie Leslie [name] is sensitive to touch in her right heel.  Gait, station and balance: Leslie Leslie [name] stands easily. No veering to one side is noted. No leaning to one side is noted. Posture is [name]-appropriate and stance is narrow based. Gait shows normal stride length and normal pace. No problems turning are noted. Leslie Leslie [name] turns en bloc. Tandem walk is unremarkable.   Assessment and Plan:   In summary, Leslie Leslie [name] is a very pleasant 75 y.o.-year old female with an underlying medical history of asthma, allergies, hypothyroidism, prior TIA in 2001, macular degeneration, vitamin D deficiency, obesity, neuropathy, plantar fasciitis, and reflux disease,  Status post foot surgeries and several back surgeries, who had an episode of speech difficulty , described as expressive dysphasia. Differential diagnosis does include TIA. Leslie Leslie [name] has not very many risk factors for vascular disease. Her exam is nonfocal and Leslie Leslie [name] is reassured in that regard. Nevertheless, we talked about secondary stroke prevention and TIA prevention at length today. I would like to proceed with a brain MRI without contrast, carotid duplex studies to both carotid arteries, and echocardiogram. We will call her with her test results. Unfortunately, Leslie Leslie [name] cannot take aspirin. Leslie Leslie [name] may not have had a recent lipid profile. Please add a lipid profile to her blood tests for next week. Leslie Leslie [name] had a recent blood tests which showed an elevated TSH and her Synthroid was increased to 112 g. This was at 100 g for quite some time. I will see her back routinely for a checkup  in about 3 months, sooner if Leslie need arises. Again, we will keep her posted as far as her test results in Leslie interim via phone call. I answered all her questions today and Leslie Leslie [name] was in agreement.  Thank you very much for allowing me to participate in Leslie care of this nice Leslie [name]. If I can be of any further assistance to you please do not hesitate to call me at [phone].  Sincerely,   Leslie Age, MD, PhD

## 2015-12-28 NOTE — Patient Instructions (Addendum)
Continue exercising regularly and take your medications as directed. As discussed, secondary prevention is key after a possible TIA or after a stroke. This means: taking care of blood sugar values or diabetes management, good blood pressure (hypertension) control and optimizing cholesterol management, exercising daily or regularly within your own mobility limitations of course, and overall cardiovascular risk factor reduction, which includes screening for and treatment of obstructive sleep apnea (OSA) and weight management.   Please make sure you have your cholesterol checked with your primary care physician, goal for your LDL ("bad cholesterol") is less than 70.   We will do a brain scan, called MRI and call you with the test results. We will have to schedule you for this on a separate date. This test requires authorization from your insurance, and we will take care of the insurance process.  We will arrange for an ultrasound neck arteries and of your heart.   Please drink more water.   Your exam looks good.

## 2015-12-30 ENCOUNTER — Ambulatory Visit
Admission: RE | Admit: 2015-12-30 | Discharge: 2015-12-30 | Disposition: A | Discharge status: Home / Self Care | Payer: Medicare Other | Source: Ambulatory Visit | Attending: Neurology | Admitting: Neurology

## 2015-12-30 DIAGNOSIS — Z8673 Personal history of transient ischemic attack (TIA), and cerebral infarction without residual deficits: Secondary | ICD-10-CM

## 2015-12-30 DIAGNOSIS — R4702 Dysphasia: Secondary | ICD-10-CM | POA: Diagnosis not present

## 2015-12-30 DIAGNOSIS — R799 Abnormal finding of blood chemistry, unspecified: Secondary | ICD-10-CM | POA: Diagnosis not present

## 2015-12-30 DIAGNOSIS — R002 Palpitations: Secondary | ICD-10-CM | POA: Diagnosis not present

## 2015-12-30 DIAGNOSIS — R079 Chest pain, unspecified: Secondary | ICD-10-CM

## 2015-12-30 DIAGNOSIS — E039 Hypothyroidism, unspecified: Secondary | ICD-10-CM | POA: Diagnosis not present

## 2016-01-02 ENCOUNTER — Telehealth: Payer: Self-pay

## 2016-01-02 NOTE — Telephone Encounter (Signed)
-----   Message from Star Age, MD sent at 01/02/2016  9:06 AM EST ----- Please call patient regarding the recent brain MRI: The brain scan showed a normal structure of the brain and moderate volume loss which we call atrophy. There were changes in the deeper structures of the brain, which we call white matter changes or microvascular changes. These were reported as severe in Her case. These are tiny white spots, that occur with time and are seen in a variety of conditions, including with normal aging, chronic hypertension, chronic headaches, especially migraine HAs, chronic diabetes, chronic hyperlipidemia. These are not strokes and no mass or lesion were seen which is reassuring. Again, there were no acute findings, such as a stroke, or mass or blood products. No further action is required on this test at this time, other than re-enforcing the HIGH importance of good blood pressure control, good cholesterol control, good blood sugar control, and weight management - all of these are overseen by her PCP. Please remind patient to keep any upcoming appointments or tests and to call us with any interim questions, concerns, problems or updates. Thanks,  Star Age, MD, PhD

## 2016-01-02 NOTE — Progress Notes (Signed)
Quick Note:  Please call patient regarding the recent brain MRI: The brain scan showed a normal structure of the brain and moderate volume loss which we call atrophy. There were changes in the deeper structures of the brain, which we call white matter changes or microvascular changes. These were reported as severe in Her case. These are tiny white spots, that occur with time and are seen in a variety of conditions, including with normal aging, chronic hypertension, chronic headaches, especially migraine HAs, chronic diabetes, chronic hyperlipidemia. These are not strokes and no mass or lesion were seen which is reassuring. Again, there were no acute findings, such as a stroke, or mass or blood products. No further action is required on this test at this time, other than re-enforcing the HIGH importance of good blood pressure control, good cholesterol control, good blood sugar control, and weight management - all of these are overseen by her PCP. Please remind patient to keep any upcoming appointments or tests and to call us with any interim questions, concerns, problems or updates. Thanks,  Star Age, MD, PhD    ______

## 2016-01-02 NOTE — Telephone Encounter (Signed)
I spoke to patient and she is aware of results and recommendations. She voiced understanding.  

## 2016-01-04 ENCOUNTER — Ambulatory Visit (HOSPITAL_COMMUNITY)
Admission: RE | Admit: 2016-01-04 | Discharge: 2016-01-04 | Disposition: A | Discharge status: Home / Self Care | Payer: Medicare Other | Source: Ambulatory Visit | Attending: Family Medicine | Admitting: Family Medicine

## 2016-01-04 ENCOUNTER — Other Ambulatory Visit: Payer: Self-pay | Admitting: Neurology

## 2016-01-04 DIAGNOSIS — G459 Transient cerebral ischemic attack, unspecified: Secondary | ICD-10-CM

## 2016-01-04 DIAGNOSIS — Z8673 Personal history of transient ischemic attack (TIA), and cerebral infarction without residual deficits: Secondary | ICD-10-CM | POA: Insufficient documentation

## 2016-01-04 NOTE — Progress Notes (Signed)
VASCULAR LAB PRELIMINARY  PRELIMINARY  PRELIMINARY  PRELIMINARY  Carotid duplex completed.    Preliminary report:  Bilateral - No evidence of ICA stenosis. Vertebral artery flow is antegrade.  Salia Cangemi, RVS 01/04/2016, 3:49 PM

## 2016-01-09 NOTE — Progress Notes (Signed)
Quick Note:  Please advise patient that the recent carotid Doppler study which is the ultrasound of the main neck arteries, was negative for any significant stenosis, that is tightness of the arteries. No further action is required at this time of this test. Star Age, MD, PhD [facility] ([facility])    ______

## 2016-01-10 ENCOUNTER — Telehealth: Payer: Self-pay

## 2016-01-10 ENCOUNTER — Other Ambulatory Visit: Payer: Self-pay

## 2016-01-10 ENCOUNTER — Ambulatory Visit (HOSPITAL_COMMUNITY): Payer: Medicare Other | Attending: Cardiology

## 2016-01-10 DIAGNOSIS — R4702 Dysphasia: Secondary | ICD-10-CM | POA: Insufficient documentation

## 2016-01-10 DIAGNOSIS — R079 Chest pain, unspecified: Secondary | ICD-10-CM | POA: Diagnosis not present

## 2016-01-10 DIAGNOSIS — I517 Cardiomegaly: Secondary | ICD-10-CM | POA: Diagnosis not present

## 2016-01-10 DIAGNOSIS — I340 Nonrheumatic mitral (valve) insufficiency: Secondary | ICD-10-CM | POA: Diagnosis not present

## 2016-01-10 DIAGNOSIS — Z8673 Personal history of transient ischemic attack (TIA), and cerebral infarction without residual deficits: Secondary | ICD-10-CM | POA: Diagnosis not present

## 2016-01-10 DIAGNOSIS — R002 Palpitations: Secondary | ICD-10-CM | POA: Insufficient documentation

## 2016-01-10 DIAGNOSIS — G459 Transient cerebral ischemic attack, unspecified: Secondary | ICD-10-CM | POA: Diagnosis present

## 2016-01-10 NOTE — Telephone Encounter (Signed)
I spoke to patient and she is aware of results and recommendations. Voiced understanding.  

## 2016-01-10 NOTE — Telephone Encounter (Signed)
-----   Message from Star Age, MD sent at 01/09/2016  6:07 PM EST ----- Please advise patient that the recent carotid Doppler study which is the ultrasound of the main neck arteries, was negative for any significant stenosis, that is tightness of the arteries. No further action is required at this time of this test. Star Age, MD, PhD [facility] ([place])

## 2016-01-11 ENCOUNTER — Telehealth: Payer: Self-pay

## 2016-01-11 NOTE — Progress Notes (Signed)
Quick Note:  Echocardiogram results were reviewed; overall good pump function and no sinister findings, reassuring, some thickening of the heart muscle overall, usually seen with high blood pressure, no further action required, please call patient to notify.  Star Age, MD, PhD [facility] ([facility])   ______

## 2016-01-11 NOTE — Telephone Encounter (Signed)
I spoke to patient and she is aware of results and recommendations.  

## 2016-01-11 NOTE — Telephone Encounter (Signed)
-----   Message from Star Age, MD sent at 01/11/2016  7:40 AM EST ----- Echocardiogram results were reviewed; overall good pump function and no sinister findings, reassuring, some thickening of the heart muscle overall, usually seen with high blood pressure, no further action required, please call patient to notify.  Star Age, MD, PhD [facility])

## 2016-01-12 DIAGNOSIS — M25519 Pain in unspecified shoulder: Secondary | ICD-10-CM | POA: Diagnosis not present

## 2016-01-12 DIAGNOSIS — R1012 Left upper quadrant pain: Secondary | ICD-10-CM | POA: Diagnosis not present

## 2016-01-12 DIAGNOSIS — Z8711 Personal history of peptic ulcer disease: Secondary | ICD-10-CM | POA: Diagnosis not present

## 2016-01-12 DIAGNOSIS — N183 Chronic kidney disease, stage 3 (moderate): Secondary | ICD-10-CM | POA: Diagnosis not present

## 2016-03-27 ENCOUNTER — Ambulatory Visit (INDEPENDENT_AMBULATORY_CARE_PROVIDER_SITE_OTHER): Payer: Medicare Other | Admitting: Neurology

## 2016-03-27 ENCOUNTER — Encounter: Payer: Self-pay | Admitting: Neurology

## 2016-03-27 VITALS — BP 148/88 | HR 80 | Resp 16 | Ht 67.5 in | Wt 204.0 lb

## 2016-03-27 DIAGNOSIS — Z8673 Personal history of transient ischemic attack (TIA), and cerebral infarction without residual deficits: Secondary | ICD-10-CM

## 2016-03-27 DIAGNOSIS — R4702 Dysphasia: Secondary | ICD-10-CM | POA: Diagnosis not present

## 2016-03-27 NOTE — Patient Instructions (Addendum)
Please try taking a baby aspirin, 81 mg, enteric coated, once daily if tolerated. If not, at least every other day.  Please make sure your thyroid function and your cholesterol values are good, and your LDL is less than 100.  Continue exercising regularly and take your medications as directed. As discussed, secondary prevention is key after a TIA or stroke. This means: taking care of blood sugar values or diabetes management, good blood pressure (hypertension) control and optimizing cholesterol management, exercising daily or regularly within your own mobility limitations of course, and overall cardiovascular risk factor reduction, which includes screening for and treatment of obstructive sleep apnea (OSA) and weight management.   Your echocardiogram was good, and carotid doppler test was good too, brain MRI showed changes in keeping with hardening of the arteries.

## 2016-03-27 NOTE — Progress Notes (Signed)
Subjective:    Patient ID: Leslie [name] is a 75 y.o. female.  HPI     Interim history:   Leslie [name] is a 75 year old right-handed woman with an underlying medical history of asthma, allergies, hypothyroidism, prior TIA in 2001, macular degeneration, vitamin D deficiency, obesity, neuropathy, plantar fasciitis, and reflux disease, who presents for follow-up consultation of an episode of dizziness and slurring of speech that occurred in January 2017. The patient is unaccompanied today. I first met her on 12/28/2015 at the request of her primary care physician, at which time she reported an episode of dizziness and slurring of speech which occurred on 12/22/2015 at a grocery store accompanied by some confusion, symptoms lasting for 40 minutes altogether. She had laboratory workup at her primary care physician's office. She had an adjustment in her thyroid medication. We talked about secondary TIA and stroke prevention at the time. I suggested further workup with a brain MRI without contrast, echocardiogram, and carotid Doppler studies.   She had a brain MRI without contrast on 12/30/2015: IMPRESSION:  This noncontrasted MRI of the brain shows the following: 1.   Moderate overall cortical atrophy that is more pronounced in the mesial temporal lobes. 2.   Severe white matter changes most consistent with advanced chronic microvascular ischemic change. None of the foci appear acute.  [name], I personally reviewed the images through the PACS syst we called her with her test results. She had a carotid Doppler test on 01/04/2016 which showed no significant ICA stenosis bilaterally. We called her with her test results.   She had an echocardiogram on 01/10/2016 which showed no significant cardiac issues. We called her with her test results.   Today, 03/27/2016: She reports having issues with allergies, has been seeing an allergy specialist for years, takes flonase, but had nosebleed. She then  tried Afrin but is aware that she is not supposed to take this chronically. She had no other episode of speech impairment. She does have intermittent headaches which she attributes to sinus issues. She tries to stay hydrated. She is limited in her physical exercise secondary to foot pain and back pain.  Previously:   12/28/2015: She reports an episode of dizziness and slurring of speech on 12/22/2015. She was at the grocery store and also had some confusion.  Her speech difficulties are described as knowing what she wants to say but it would not come out. This lasted about 40 minutes. She did not seek immediate medical attention by going to the emergency room. She had some left arm pain at the time and some left arm weakness. She was seen in your office the next day on 12/23/2015. I reviewed your office note. She had blood work, including CBC with differential, CMP, PT, PTT, TSH, and EKG. You mentioned ordering carotid Doppler studies as well as a brain scan. She is currently on generic Zoloft 50 mg daily, generic Flonase twice daily, Zantac 150 mg once daily, Proventil, meclizine as needed twice a day, Astepro, levothyroxine 100 g daily, vitamin D 2000 units once a day. She is currently not on any aspirin or cholesterol medication. She had a bleeding ulcer and was told not to take ASA. Her lipids are  Usually borderline she says.  She has had some reflux symptoms but had an allergic reaction to PPIs.  she tried smoking many years ago but quit. She does not drink alcohol. She tries to hydrate well. She has heel pain bilaterally. She had tendinitis and also some  surgeries to both feet. This limits her exercise abilities. She tried swimming but she still felt that her foot pain exacerbated with swimming. She has had prior multiple back surgeries. This also limits her exercise ability and range of motion. Her vascular risk factors are prior TIA in 2001 and obesity and she was at [facility] at the time,  weakness on the RUE. She was at home at the time, husband called EMS. He died in 03-20-2023.   I reviewed your office note from 12/23/2015, which you kindly included. She has had intermittent chest pains and some palpitations at times.  Her Past Medical History Is Significant For: Past Medical History  Diagnosis Date  . Stroke ([place])   . Plantar fasciitis   . Hypothyroidism   . Depression   . Insomnia   . Macular degeneration   . Vitamin D deficiency     Her Past Surgical History Is Significant For: Past Surgical History  Procedure Laterality Date  . L heel surgery for torn tendon    . Plantar fascia surgery Right   . Hysterotomy    . Hand surgery Left     x2  . Shoulder arthroscopy Right   . Appendectomy    . Back surgery      x4    Her Family History Is Significant For: Family History  Problem Relation Age of Onset  . Hypertension Mother   . Lung cancer Mother   . Lung cancer Father   . Diabetes Sister   . Hypertension Sister   . Congestive Heart Failure Sister   . Arthritis Brother     Her Social History Is Significant For: Social History   Social History  . Marital Status: Widowed    Spouse Name: N/A  . Number of Children: 1  . Years of Education: HS+   Occupational History  . Retired     Social History Main Topics  . Smoking status: Former Research scientist (life sciences)  . Smokeless tobacco: None     Comment: Only smoked a few years  . Alcohol Use: No  . Drug Use: No  . Sexual Activity: Not Asked   Other Topics Concern  . None   Social History Narrative   Drinks about 3 cups of coffee a day     Her Allergies Are:  Allergies  Allergen Reactions  . Voltaren [Diclofenac Sodium] Shortness Of Breath  . Aspirin Other (See Comments)    ulcers  . Gabapentin Itching  . Ultram [Tramadol Hcl] Other (See Comments)    Effects cognitive abilities   :   Her Current Medications Are:  Outpatient Encounter Prescriptions as of 03/27/2016  Medication Sig  . albuterol (PROVENTIL  HFA;VENTOLIN HFA) 108 (90 Base) MCG/ACT inhaler Inhale into the lungs every 6 (six) hours as needed for wheezing or shortness of breath.  . Cholecalciferol (VITAMIN D) 2000 units CAPS Take by mouth. Reported on 12/28/2015  . fluticasone (FLOVENT HFA) 110 MCG/ACT inhaler Inhale into the lungs 2 (two) times daily.  Marland Kitchen levothyroxine (SYNTHROID, LEVOTHROID) 112 MCG tablet   . meclizine (ANTIVERT) 25 MG tablet Take 25 mg by mouth 3 (three) times daily as needed for dizziness.  . ranitidine (ZANTAC) 150 MG capsule Take 150 mg by mouth 2 (two) times daily. Reported on 12/28/2015  . sertraline (ZOLOFT) 50 MG tablet Take 50 mg by mouth daily.  . [DISCONTINUED] fluticasone (FLONASE) 50 MCG/ACT nasal spray   . [DISCONTINUED] levothyroxine (SYNTHROID, LEVOTHROID) 100 MCG tablet Take 112 mcg by mouth daily  before breakfast.    No facility-administered encounter medications on file as of 03/27/2016.  :  Review of Systems:  Out of a complete 14 point review of systems, all are reviewed and negative with the exception of these symptoms as listed below:  Review of Systems  Neurological:       No new concerns per patient.     Objective:  Neurologic Exam  Physical Exam Physical Examination:   Filed Vitals:   03/27/16 1408  BP: 148/88  Pulse: 80  Resp: 16   General Examination: The patient is a very pleasant 75 y.o. female in no acute distress. She appears well-developed and well-nourished and very well groomed. She is in good spirits today.  HEENT: Normocephalic, atraumatic, pupils are equal, round and reactive to light and accommodation.  She is status post bilateral cataract repairs. Extraocular tracking is good without limitation to gaze excursion or nystagmus noted. Normal smooth pursuit is noted. Hearing is grossly intact. Face is symmetric with normal facial animation and normal facial sensation. Speech is clear with no dysarthria noted. There is no hypophonia. There is no lip, neck/head, jaw or  voice tremor. Neck is supple with full range of passive and active motion. There are no carotid bruits on auscultation. Oropharynx exam reveals: mild mouth dryness, adequate dental hygiene and mild airway crowding. Mallampati is class II. Tongue protrudes centrally and palate elevates symmetrically.  Chest: Clear to auscultation without wheezing, rhonchi or crackles noted.  Heart: S1+S2+0, regular and normal without murmurs, rubs or gallops noted.   Abdomen: Soft, non-tender and non-distended with normal bowel sounds appreciated on auscultation.  Extremities: There is no pitting edema in the distal lower extremities bilaterally. Pedal pulses are intact.  Skin: Warm and dry without trophic changes noted. There are  mild varicose veins , right more than left.  Musculoskeletal: exam reveals no obvious joint deformities, tenderness or joint swelling or erythema, with the exception of foot pain.   Neurologically:  Mental status: The patient is awake, alert and oriented in all 4 spheres. Her immediate and remote memory, attention, language skills and fund of knowledge are appropriate. There is no evidence of aphasia, agnosia, apraxia or anomia. Speech is clear with normal prosody and enunciation. Thought process is linear. Mood is normal and affect is normal.  Cranial nerves II - XII are as described above under HEENT exam. [name]: shoulder shrug is normal with equal shoulder height noted. Motor exam: Normal bulk, strength and tone is noted. There is no drift, tremor or rebound. Romberg is negative. Reflexes are 2+ throughout. Fine motor skills and coordination: intact with normal finger taps, normal hand movements, normal rapid alternating patting, normal foot taps and normal foot agility. and Cerebellar testing: No dysmetria or intention tremor on finger to nose testing. Heel to shin is unremarkable bilaterally. There is no truncal or gait ataxia.  Sensory exam: intact to light touch in the upper  and lower extremities, but she is sensitive to touch in her right heel.  Gait, station and balance: She stands easily. No veering to one side is noted. No leaning to one side is noted. Posture is age-appropriate and stance is narrow based. Gait shows normal stride length and normal pace. No problems turning are noted. Tandem walk is not possible.  Assessment and Plan:   In summary, Shailynn Fong is a very pleasant 75 year old female with an underlying medical history of asthma, allergies, hypothyroidism, prior TIA in 2001, macular degeneration, vitamin D deficiency, obesity,  neuropathy, plantar fasciitis, and reflux disease, status post foot surgeries and several back surgeries, who had an episode of speech difficulty, expressive dysphasia type. Differential diagnosis included TIA. She has not very many risk factors for vascular disease, but did have a prior TIA. Her exam continues to be nonfocal and she is reassured in that regard. Nevertheless, we talked about secondary stroke prevention and TIA prevention again today. We talked about her workup which included carotid Doppler studies, echocardiogram and brain MRI results. Her echocardiogram and carotid Doppler studies were unremarkable, brain MRI showed findings in keeping with white matter changes and atrophy. We will continue to monitor her exam.she is advised to make sure her blood pressure, her cholesterol values and her thyroid function are checked regularly, at least once a year basis, through primary care physician. Unfortunately, she cannot take larger dose of aspirin, but may be able to get away with once daily or qod baby ASA, coated, she is encouraged to try this for prevention. I will see her back routinely for a checkup in about 6 months, sooner if the need arises. I answered all her questions today and the patient was in agreement.  I spent 25 minutes in total face-to-face time with the patient, more than 50% of which was spent in  counseling and coordination of care, reviewing test results, reviewing medication and discussing or reviewing the diagnosis of TIA, its prognosis and treatment options.

## 2016-04-03 DIAGNOSIS — E039 Hypothyroidism, unspecified: Secondary | ICD-10-CM | POA: Diagnosis not present

## 2016-04-03 DIAGNOSIS — R799 Abnormal finding of blood chemistry, unspecified: Secondary | ICD-10-CM | POA: Diagnosis not present

## 2016-04-03 DIAGNOSIS — E785 Hyperlipidemia, unspecified: Secondary | ICD-10-CM | POA: Diagnosis not present

## 2016-04-13 DIAGNOSIS — M79671 Pain in right foot: Secondary | ICD-10-CM | POA: Diagnosis not present

## 2016-04-13 DIAGNOSIS — M722 Plantar fascial fibromatosis: Secondary | ICD-10-CM | POA: Diagnosis not present

## 2016-04-26 DIAGNOSIS — M722 Plantar fascial fibromatosis: Secondary | ICD-10-CM | POA: Diagnosis not present

## 2016-05-04 DIAGNOSIS — M722 Plantar fascial fibromatosis: Secondary | ICD-10-CM | POA: Diagnosis not present

## 2016-05-04 DIAGNOSIS — M6701 Short Achilles tendon (acquired), right ankle: Secondary | ICD-10-CM | POA: Diagnosis not present

## 2016-05-04 DIAGNOSIS — M19071 Primary osteoarthritis, right ankle and foot: Secondary | ICD-10-CM | POA: Diagnosis not present

## 2016-05-17 DIAGNOSIS — Z1231 Encounter for screening mammogram for malignant neoplasm of breast: Secondary | ICD-10-CM | POA: Diagnosis not present

## 2016-05-21 DIAGNOSIS — Z01818 Encounter for other preprocedural examination: Secondary | ICD-10-CM | POA: Diagnosis not present

## 2016-05-21 DIAGNOSIS — M722 Plantar fascial fibromatosis: Secondary | ICD-10-CM | POA: Diagnosis not present

## 2016-05-23 DIAGNOSIS — D72829 Elevated white blood cell count, unspecified: Secondary | ICD-10-CM | POA: Diagnosis not present

## 2016-05-30 ENCOUNTER — Other Ambulatory Visit: Payer: Self-pay | Admitting: Orthopedic Surgery

## 2016-06-04 ENCOUNTER — Encounter (HOSPITAL_BASED_OUTPATIENT_CLINIC_OR_DEPARTMENT_OTHER): Payer: Self-pay | Admitting: *Deleted

## 2016-06-04 NOTE — Pre-Procedure Instructions (Signed)
Pt states she had labs and EKG done at Dr. Raliegh Ip Little's office. Requested info from [facility].

## 2016-06-07 ENCOUNTER — Ambulatory Visit (HOSPITAL_BASED_OUTPATIENT_CLINIC_OR_DEPARTMENT_OTHER)
Admission: RE | Admit: 2016-06-07 | Discharge: 2016-06-07 | Disposition: A | Discharge status: Home / Self Care | Payer: Medicare Other | Source: Ambulatory Visit | Attending: Orthopedic Surgery | Admitting: Orthopedic Surgery

## 2016-06-07 ENCOUNTER — Ambulatory Visit (HOSPITAL_BASED_OUTPATIENT_CLINIC_OR_DEPARTMENT_OTHER): Payer: Medicare Other | Admitting: Anesthesiology

## 2016-06-07 ENCOUNTER — Encounter (HOSPITAL_BASED_OUTPATIENT_CLINIC_OR_DEPARTMENT_OTHER)
Admission: RE | Disposition: A | Discharge status: Home / Self Care | Payer: Self-pay | Source: Ambulatory Visit | Attending: Orthopedic Surgery

## 2016-06-07 ENCOUNTER — Encounter (HOSPITAL_BASED_OUTPATIENT_CLINIC_OR_DEPARTMENT_OTHER): Payer: Self-pay | Admitting: *Deleted

## 2016-06-07 DIAGNOSIS — M13871 Other specified arthritis, right ankle and foot: Secondary | ICD-10-CM | POA: Diagnosis not present

## 2016-06-07 DIAGNOSIS — M79661 Pain in right lower leg: Secondary | ICD-10-CM | POA: Diagnosis not present

## 2016-06-07 DIAGNOSIS — Z7982 Long term (current) use of aspirin: Secondary | ICD-10-CM | POA: Insufficient documentation

## 2016-06-07 DIAGNOSIS — Z87891 Personal history of nicotine dependence: Secondary | ICD-10-CM | POA: Diagnosis not present

## 2016-06-07 DIAGNOSIS — N183 Chronic kidney disease, stage 3 (moderate): Secondary | ICD-10-CM | POA: Insufficient documentation

## 2016-06-07 DIAGNOSIS — E039 Hypothyroidism, unspecified: Secondary | ICD-10-CM | POA: Diagnosis not present

## 2016-06-07 DIAGNOSIS — M19071 Primary osteoarthritis, right ankle and foot: Secondary | ICD-10-CM | POA: Diagnosis not present

## 2016-06-07 DIAGNOSIS — Z8673 Personal history of transient ischemic attack (TIA), and cerebral infarction without residual deficits: Secondary | ICD-10-CM | POA: Insufficient documentation

## 2016-06-07 DIAGNOSIS — J45909 Unspecified asthma, uncomplicated: Secondary | ICD-10-CM | POA: Diagnosis not present

## 2016-06-07 DIAGNOSIS — G8918 Other acute postprocedural pain: Secondary | ICD-10-CM | POA: Diagnosis not present

## 2016-06-07 DIAGNOSIS — M6701 Short Achilles tendon (acquired), right ankle: Secondary | ICD-10-CM | POA: Diagnosis not present

## 2016-06-07 HISTORY — DX: Unspecified asthma, uncomplicated: J45.909

## 2016-06-07 HISTORY — DX: Chronic kidney disease, unspecified: N18.9

## 2016-06-07 HISTORY — PX: GASTROCNEMIUS RECESSION: SHX863

## 2016-06-07 HISTORY — DX: Gastric ulcer, unspecified as acute or chronic, without hemorrhage or perforation: K25.9

## 2016-06-07 HISTORY — PX: ANKLE FUSION: SHX881

## 2016-06-07 SURGERY — ARTHRODESIS ANKLE
Anesthesia: General | Site: Leg Lower | Laterality: Right

## 2016-06-07 MED ORDER — BUPIVACAINE-EPINEPHRINE (PF) 0.5% -1:200000 IJ SOLN
INTRAMUSCULAR | Status: DC | PRN
  Administered 2016-06-07 (×2): 10 mL via PERINEURAL

## 2016-06-07 MED ORDER — PROPOFOL 10 MG/ML IV BOLUS
INTRAVENOUS | Status: DC | PRN
  Administered 2016-06-07: 150 mg via INTRAVENOUS

## 2016-06-07 MED ORDER — LACTATED RINGERS IV SOLN
INTRAVENOUS | Status: DC
  Administered 2016-06-07 (×2): via INTRAVENOUS

## 2016-06-07 MED ORDER — ONDANSETRON HCL 4 MG/2ML IJ SOLN
INTRAMUSCULAR | Status: DC | PRN
  Administered 2016-06-07 (×2): 4 mg via INTRAVENOUS

## 2016-06-07 MED ORDER — ROPIVACAINE HCL 5 MG/ML IJ SOLN
INTRAMUSCULAR | Status: DC | PRN
  Administered 2016-06-07 (×2): 10 mL via PERINEURAL

## 2016-06-07 MED ORDER — MIDAZOLAM HCL 2 MG/2ML IJ SOLN
INTRAMUSCULAR | Status: AC
  Filled 2016-06-07: qty 2

## 2016-06-07 MED ORDER — LIDOCAINE 2% (20 MG/ML) 5 ML SYRINGE
INTRAMUSCULAR | Status: DC | PRN
  Administered 2016-06-07: 60 mg via INTRAVENOUS

## 2016-06-07 MED ORDER — DEXAMETHASONE SODIUM PHOSPHATE 10 MG/ML IJ SOLN
INTRAMUSCULAR | Status: DC | PRN
  Administered 2016-06-07: 10 mg via INTRAVENOUS

## 2016-06-07 MED ORDER — CEFAZOLIN SODIUM-DEXTROSE 2-4 GM/100ML-% IV SOLN
2.0000 g | INTRAVENOUS | Status: AC
  Administered 2016-06-07: 2 g via INTRAVENOUS

## 2016-06-07 MED ORDER — FENTANYL CITRATE (PF) 100 MCG/2ML IJ SOLN
INTRAMUSCULAR | Status: AC
  Filled 2016-06-07: qty 2

## 2016-06-07 MED ORDER — CHLORHEXIDINE GLUCONATE 4 % EX LIQD: 60.0000 mL | Freq: Once | CUTANEOUS | Status: DC

## 2016-06-07 MED ORDER — OXYCODONE HCL 5 MG PO TABS: 5.0000 mg | ORAL_TABLET | ORAL | Status: DC | PRN

## 2016-06-07 MED ORDER — FENTANYL CITRATE (PF) 100 MCG/2ML IJ SOLN
50.0000 ug | INTRAMUSCULAR | Status: DC | PRN
  Administered 2016-06-07: 100 ug via INTRAVENOUS

## 2016-06-07 MED ORDER — 0.9 % SODIUM CHLORIDE (POUR BTL) OPTIME
TOPICAL | Status: DC | PRN
  Administered 2016-06-07: 250 mL

## 2016-06-07 MED ORDER — CEFAZOLIN SODIUM-DEXTROSE 2-4 GM/100ML-% IV SOLN
INTRAVENOUS | Status: AC
  Filled 2016-06-07: qty 100

## 2016-06-07 MED ORDER — GLYCOPYRROLATE 0.2 MG/ML IJ SOLN: 0.2000 mg | Freq: Once | INTRAMUSCULAR | Status: DC | PRN

## 2016-06-07 MED ORDER — MIDAZOLAM HCL 2 MG/2ML IJ SOLN
1.0000 mg | INTRAMUSCULAR | Status: DC | PRN
  Administered 2016-06-07: 2 mg via INTRAVENOUS

## 2016-06-07 MED ORDER — SODIUM CHLORIDE 0.9 % IV SOLN: INTRAVENOUS | Status: DC

## 2016-06-07 SURGICAL SUPPLY — 82 items
BANDAGE ESMARK 6X9 LF (GAUZE/BANDAGES/DRESSINGS) ×2 IMPLANT
BIT DRILL 2.5X2.75 QC CALB (BIT) ×3 IMPLANT
BLADE AVERAGE 25X9 (BLADE) IMPLANT
BLADE MICRO SAGITTAL (BLADE) IMPLANT
BLADE SURG 15 STRL LF DISP TIS (BLADE) ×6 IMPLANT
BLADE SURG 15 STRL SS (BLADE) ×3
BNDG COHESIVE 4X5 TAN STRL (GAUZE/BANDAGES/DRESSINGS) ×3 IMPLANT
BNDG COHESIVE 6X5 TAN STRL LF (GAUZE/BANDAGES/DRESSINGS) ×3 IMPLANT
BNDG ESMARK 6X9 LF (GAUZE/BANDAGES/DRESSINGS) ×3
BONE GRAFT AUGMENT 3CC (Orthopedic Implant) ×3 IMPLANT
BOOT STEPPER DURA LG (SOFTGOODS) IMPLANT
BOOT STEPPER DURA MED (SOFTGOODS) IMPLANT
BOOT STEPPER DURA XLG (SOFTGOODS) IMPLANT
CHLORAPREP W/TINT 26ML (MISCELLANEOUS) ×3 IMPLANT
COVER BACK TABLE 60X90IN (DRAPES) ×3 IMPLANT
CUFF TOURNIQUET SINGLE 34IN LL (TOURNIQUET CUFF) ×3 IMPLANT
DECANTER SPIKE VIAL GLASS SM (MISCELLANEOUS) IMPLANT
DRAPE EXTREMITY T 121X128X90 (DRAPE) ×3 IMPLANT
DRAPE OEC MINIVIEW 54X84 (DRAPES) ×3 IMPLANT
DRAPE U-SHAPE 47X51 STRL (DRAPES) ×3 IMPLANT
DRAPE UTILITY XL STRL (DRAPES) IMPLANT
DRSG MEPITEL 4X7.2 (GAUZE/BANDAGES/DRESSINGS) ×3 IMPLANT
DRSG PAD ABDOMINAL 8X10 ST (GAUZE/BANDAGES/DRESSINGS) ×6 IMPLANT
ELECT REM PT RETURN 9FT ADLT (ELECTROSURGICAL) ×3
ELECTRODE REM PT RTRN 9FT ADLT (ELECTROSURGICAL) ×2 IMPLANT
GAUZE SPONGE 4X4 12PLY STRL (GAUZE/BANDAGES/DRESSINGS) ×3 IMPLANT
GLOVE BIO SURGEON STRL SZ8 (GLOVE) ×3 IMPLANT
GLOVE BIOGEL PI IND STRL 7.0 (GLOVE) ×6 IMPLANT
GLOVE BIOGEL PI IND STRL 8 (GLOVE) ×4 IMPLANT
GLOVE BIOGEL PI INDICATOR 7.0 (GLOVE) ×3
GLOVE BIOGEL PI INDICATOR 8 (GLOVE) ×2
GLOVE ECLIPSE 6.5 STRL STRAW (GLOVE) ×6 IMPLANT
GLOVE ECLIPSE 7.5 STRL STRAW (GLOVE) ×3 IMPLANT
GLOVE EXAM NITRILE MD LF STRL (GLOVE) IMPLANT
GOWN STRL REUS W/ TWL LRG LVL3 (GOWN DISPOSABLE) ×4 IMPLANT
GOWN STRL REUS W/ TWL XL LVL3 (GOWN DISPOSABLE) ×4 IMPLANT
GOWN STRL REUS W/TWL LRG LVL3 (GOWN DISPOSABLE) ×2
GOWN STRL REUS W/TWL XL LVL3 (GOWN DISPOSABLE) ×2
K-WIRE 1.8 (WIRE) ×2
K-WIRE FX200X1.8XTROC TIP (WIRE) ×4
KWIRE FX200X1.8XTROC TIP (WIRE) ×4 IMPLANT
NEEDLE HYPO 22GX1.5 SAFETY (NEEDLE) IMPLANT
NEEDLE HYPO 25X1 1.5 SAFETY (NEEDLE) IMPLANT
NS IRRIG 1000ML POUR BTL (IV SOLUTION) ×3 IMPLANT
PACK BASIN DAY SURGERY FS (CUSTOM PROCEDURE TRAY) ×3 IMPLANT
PAD CAST 4YDX4 CTTN HI CHSV (CAST SUPPLIES) ×2 IMPLANT
PADDING CAST ABS 4INX4YD NS (CAST SUPPLIES)
PADDING CAST ABS COTTON 4X4 ST (CAST SUPPLIES) IMPLANT
PADDING CAST COTTON 4X4 STRL (CAST SUPPLIES) ×1
PADDING CAST COTTON 6X4 STRL (CAST SUPPLIES) ×3 IMPLANT
PENCIL BUTTON HOLSTER BLD 10FT (ELECTRODE) ×3 IMPLANT
PLATE LOCK LG 3.5 (Plate) ×3 IMPLANT
SANITIZER HAND PURELL 535ML FO (MISCELLANEOUS) ×3 IMPLANT
SCREW CANN 5.0X40MM (Screw) ×3 IMPLANT
SCREW CANNULATED 5.0X44MM (Screw) ×3 IMPLANT
SCREW CORT 3.5X32 (Screw) ×1 IMPLANT
SCREW CORT T15 30X3.5XST LCK (Screw) ×2 IMPLANT
SCREW CORT T15 32X3.5XST LCK (Screw) ×2 IMPLANT
SCREW CORTICAL 3.5X30MM (Screw) ×1 IMPLANT
SCREW LOCK CORT STAR 3.5X30 (Screw) ×6 IMPLANT
SHEET MEDIUM DRAPE 40X70 STRL (DRAPES) ×6 IMPLANT
SLEEVE SCD COMPRESS KNEE MED (MISCELLANEOUS) ×3 IMPLANT
SPLINT FAST PLASTER 5X30 (CAST SUPPLIES) ×20
SPLINT PLASTER CAST FAST 5X30 (CAST SUPPLIES) ×40 IMPLANT
SPONGE LAP 18X18 X RAY DECT (DISPOSABLE) ×3 IMPLANT
SPONGE SURGIFOAM ABS GEL 12-7 (HEMOSTASIS) IMPLANT
STOCKINETTE 6  STRL (DRAPES) ×1
STOCKINETTE 6 STRL (DRAPES) ×2 IMPLANT
STRIP CLOSURE SKIN 1/2X4 (GAUZE/BANDAGES/DRESSINGS) IMPLANT
SUCTION FRAZIER HANDLE 10FR (MISCELLANEOUS) ×1
SUCTION TUBE FRAZIER 10FR DISP (MISCELLANEOUS) ×2 IMPLANT
SUT ETHILON 3 0 PS 1 (SUTURE) ×6 IMPLANT
SUT MNCRL AB 3-0 PS2 18 (SUTURE) ×3 IMPLANT
SUT VIC AB 0 SH 27 (SUTURE) IMPLANT
SUT VIC AB 2-0 SH 27 (SUTURE) ×1
SUT VIC AB 2-0 SH 27XBRD (SUTURE) ×2 IMPLANT
SYR BULB 3OZ (MISCELLANEOUS) ×3 IMPLANT
SYR CONTROL 10ML LL (SYRINGE) IMPLANT
TOWEL OR 17X24 6PK STRL BLUE (TOWEL DISPOSABLE) ×6 IMPLANT
TUBE CONNECTING 20X1/4 (TUBING) ×3 IMPLANT
UNDERPAD 30X30 (UNDERPADS AND DIAPERS) ×3 IMPLANT
YANKAUER SUCT BULB TIP NO VENT (SUCTIONS) IMPLANT

## 2016-06-07 NOTE — Anesthesia Preprocedure Evaluation (Addendum)
Anesthesia Evaluation  Patient identified by MRN, date of birth, ID band Patient awake    Reviewed: Allergy & Precautions, NPO status , Patient's Chart, lab work & pertinent test results  Airway Mallampati: I  TM Distance: >3 FB Neck ROM: Full    Dental  (+) Teeth Intact, Dental Advisory Given   Pulmonary asthma , former smoker,    breath sounds clear to auscultation       Cardiovascular  Rhythm:Regular Rate:Normal     Neuro/Psych    GI/Hepatic PUD,   Endo/Other  Hypothyroidism   Renal/GU      Musculoskeletal   Abdominal   Peds  Hematology   Anesthesia Other Findings   Reproductive/Obstetrics                            Anesthesia Physical Anesthesia Plan  ASA: II  Anesthesia Plan: General   Post-op Pain Management: GA combined w/ Regional for post-op pain   Induction: Intravenous  Airway Management Planned: LMA  Additional Equipment:   Intra-op Plan:   Post-operative Plan: Extubation in OR  Informed Consent: I have reviewed the patients History and Physical, chart, labs and discussed the procedure including the risks, benefits and alternatives for the proposed anesthesia with the patient or authorized representative who has indicated his/her understanding and acceptance.   Dental advisory given  Plan Discussed with: CRNA, Anesthesiologist and Surgeon  Anesthesia Plan Comments:         Anesthesia Quick Evaluation

## 2016-06-07 NOTE — Discharge Instructions (Addendum)
Leslie Simmer, MD [place]  Please read the following information regarding your care after surgery.  Medications  You only need a prescription for the narcotic pain medicine (ex. oxycodone, Percocet, Norco).  All of the other medicines listed below are available over the counter. X acetominophen (Tylenol) 650 mg every 4-6 hours as you need for minor pain X oxycodone as prescribed for moderate to severe pain ?   Narcotic pain medicine (ex. oxycodone, Percocet, Vicodin) will cause constipation.  To prevent this problem, take the following medicines while you are taking any pain medicine. X docusate sodium (Colace) 100 mg twice a day X senna (Senokot) 2 tablets twice a day  X To help prevent blood clots, take a baby aspirin (81 mg) twice a day for two weeks after surgery.  You should also get up every hour while you are awake to move around.    Weight Bearing ? Bear weight when you are able on your operated leg or foot. ? Bear weight only on the heel of your operated foot in the post-op shoe. X Do not bear any weight on the operated leg or foot.  Cast / Splint / Dressing X Keep your splint or cast clean and dry.  Dont put anything (coat hanger, pencil, etc) down inside of it.  If it gets damp, use a hair dryer on the cool setting to dry it.  If it gets soaked, call the office to schedule an appointment for a cast change. ? Remove your dressing 3 days after surgery and cover the incisions with dry dressings.    After your dressing, cast or splint is removed; you may shower, but do not soak or scrub the wound.  Allow the water to run over it, and then gently pat it dry.  Swelling It is normal for you to have swelling where you had surgery.  To reduce swelling and pain, keep your toes above your nose for at least 3 days after surgery.  It may be necessary to keep your foot or leg elevated for several weeks.  If it hurts, it should be elevated.  Follow Up Call my office at  [phone] when you are discharged from the hospital or surgery center to schedule an appointment to be seen two weeks after surgery.  Call my office at [phone] if you develop a fever >101.5 F, nausea, vomiting, bleeding from the surgical site or severe pain.    Regional Anesthesia Blocks  1. Numbness or the inability to move the "blocked" extremity may last from 3-48 hours after placement. The length of time depends on the medication injected and your individual response to the medication. If the numbness is not going away after 48 hours, call your surgeon.  2. The extremity that is blocked will need to be protected until the numbness is gone and the  Strength has returned. Because you cannot feel it, you will need to take extra care to avoid injury. Because it may be weak, you may have difficulty moving it or using it. You may not know what position it is in without looking at it while the block is in effect.  3. For blocks in the legs and feet, returning to weight bearing and walking needs to be done carefully. You will need to wait until the numbness is entirely gone and the strength has returned. You should be able to move your leg and foot normally before you try and bear weight or walk. You will need someone to be  with you when you first try to ensure you do not fall and possibly risk injury.  4. Bruising and tenderness at the needle site are common side effects and will resolve in a few days.  5. Persistent numbness or new problems with movement should be communicated to the surgeon or the [place] [phone] [place] [phone]).  Post Anesthesia Home Care Instructions  Activity: Get plenty of rest for the remainder of the day. A responsible adult should stay with you for 24 hours following the procedure.  For the next 24 hours, DO NOT: -Drive a car -Paediatric nurse -Drink alcoholic beverages -Take any medication unless instructed by  your physician -Make any legal decisions or sign important papers.  Meals: Start with liquid foods such as gelatin or soup. Progress to regular foods as tolerated. Avoid greasy, spicy, heavy foods. If nausea and/or vomiting occur, drink only clear liquids until the nausea and/or vomiting subsides. Call your physician if vomiting continues.  Special Instructions/Symptoms: Your throat may feel dry or sore from the anesthesia or the breathing tube placed in your throat during surgery. If this causes discomfort, gargle with warm salt water. The discomfort should disappear within 24 hours.  If you had a scopolamine patch placed behind your ear for the management of post- operative nausea and/or vomiting:  1. The medication in the patch is effective for 72 hours, after which it should be removed.  Wrap patch in a tissue and discard in the trash. Wash hands thoroughly with soap and water. 2. You may remove the patch earlier than 72 hours if you experience unpleasant side effects which may include dry mouth, dizziness or visual disturbances. 3. Avoid touching the patch. Wash your hands with soap and water after contact with the patch.

## 2016-06-07 NOTE — Transfer of Care (Signed)
Immediate Anesthesia Transfer of Care Note  Patient: Leslie [name]  Procedure(s) Performed: Procedure(s): RIGHT TALONAVICULAR ARTHRODESIS  (Right) RIGHT GASTROC RECESSION  (Right)  Patient Location: PACU  Anesthesia Type:GA combined with regional for post-op pain  Level of Consciousness: sedated  Airway & Oxygen Therapy: Patient Spontanous Breathing and Patient connected to face mask oxygen  Post-op Assessment: Report given to RN and Post -op Vital signs reviewed and stable  Post vital signs: Reviewed and stable  Last Vitals:  Filed Vitals:   06/07/16 0945 06/07/16 0950  BP: 119/54 127/60  Pulse: 69 70  Temp:    Resp: 12 13    Last Pain: There were no vitals filed for this visit.       Complications: No apparent anesthesia complications

## 2016-06-07 NOTE — Progress Notes (Signed)
Assisted Dr. Crews with right, ultrasound guided, popliteal/saphenous block. Side rails up, monitors on throughout procedure. See vital signs in flow sheet. Tolerated Procedure well. 

## 2016-06-07 NOTE — Anesthesia Postprocedure Evaluation (Signed)
Anesthesia Post Note  Patient: Rosaly Merkley  Procedure(s) Performed: Procedure(s) (LRB): RIGHT TALONAVICULAR ARTHRODESIS  (Right) RIGHT GASTROC RECESSION  (Right)  Patient location during evaluation: PACU Anesthesia Type: General Level of consciousness: awake and alert Pain management: pain level controlled Vital Signs Assessment: post-procedure vital signs reviewed and stable Respiratory status: spontaneous breathing, nonlabored ventilation and respiratory function stable Cardiovascular status: blood pressure returned to baseline and stable Postop Assessment: no signs of nausea or vomiting Anesthetic complications: no    Last Vitals:  Filed Vitals:   06/07/16 1318 06/07/16 1319  BP:  119/69  Pulse: 69 80  Temp:  36.9 C  Resp:  19    Last Pain:  Filed Vitals:   06/07/16 1328  PainSc: 0-No pain        RLE Motor Response: Non-purposeful movement (06/07/16 1319) RLE Sensation: No sensation (absent) (06/07/16 1319)      Crew Goren A

## 2016-06-07 NOTE — Anesthesia Procedure Notes (Addendum)
Anesthesia Regional Block:  Popliteal block  Pre-Anesthetic Checklist: ,, timeout performed, Correct Patient, Correct Site, Correct Laterality, Correct Procedure, Correct Position, site marked, Risks and benefits discussed,  Surgical consent,  Pre-op evaluation,  At surgeon's request and post-op pain management  Laterality: Lower and Right  Prep: chloraprep       Needles:  Injection technique: Single-shot  Needle Type: Echogenic Needle     Needle Length: 9cm 9 cm Needle Gauge: 21 and 21 G    Additional Needles:  Procedures: ultrasound guided (picture in chart) Popliteal block Narrative:  Start time: 06/07/2016 9:19 AM End time: 06/07/2016 9:24 AM Injection made incrementally with aspirations every 5 mL.  Performed by: Personally  Anesthesiologist: CREWS, DAVID   Anesthesia Regional Block:  Adductor canal block  Pre-Anesthetic Checklist: ,, timeout performed, Correct Patient, Correct Site, Correct Laterality, Correct Procedure, Correct Position, site marked, Risks and benefits discussed,  Surgical consent,  Pre-op evaluation,  At surgeon's request and post-op pain management  Laterality: Right and Lower  Prep: chloraprep       Needles:  Injection technique: Single-shot  Needle Type: Echogenic Needle     Needle Length: 9cm 9 cm Needle Gauge: 21 and 21 G    Additional Needles:  Procedures: ultrasound guided (picture in chart) Adductor canal block Narrative:  Start time: 06/07/2016 9:25 AM End time: 06/07/2016 9:29 AM Injection made incrementally with aspirations every 5 mL.  Performed by: Personally  Anesthesiologist: CREWS, DAVID   Procedure Name: LMA Insertion Date/Time: 06/07/2016 11:49 AM Performed by: Maryella Shivers Pre-anesthesia Checklist: Patient identified, Emergency Drugs available, Suction available and Patient being monitored Patient Re-evaluated:Patient Re-evaluated prior to inductionOxygen Delivery Method: Circle system  utilized Preoxygenation: Pre-oxygenation with 100% oxygen Intubation Type: IV induction Ventilation: Mask ventilation without difficulty LMA: LMA inserted LMA Size: 4.0 Number of attempts: 1 Airway Equipment and Method: Bite block Placement Confirmation: positive ETCO2 Tube secured with: Tape Dental Injury: Teeth and Oropharynx as per pre-operative assessment      Right saphenous block image      Right Popliteal block image

## 2016-06-07 NOTE — H&P (Signed)
Leslie [name] is an 75 y.o. female.   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  She presents today for right talonavicular arthrodesis.  Past Medical History  Diagnosis Date  . Plantar fasciitis   . Hypothyroidism   . Depression   . Insomnia   . Vitamin D deficiency   . Stroke [facility])     TIA X2   2001, jan 2017  . Asthma   . Stomach ulcer ~2000  . Chronic kidney disease     "stage 3 kidney failure" improving per pt    Past Surgical History  Procedure Laterality Date  . L heel surgery for torn tendon Left 2015  . Plantar fascia surgery Right 2009    surgery at [facility]  . Hand surgery Left 2003    x2  . Shoulder arthroscopy Right 2007  . Appendectomy    . Back surgery      x4  fusion T12 thru sacrum; U rod in sacrum  . Abdominal hysterectomy  1974  . Cataract extraction, bilateral Bilateral 2015  . Tendon repair Right 1949    foot- lacerated tendon in arch of foot    Family History  Problem Relation Age of Onset  . Hypertension Mother   . Lung cancer Mother   . Lung cancer Father   . Diabetes Sister   . Hypertension Sister   . Congestive Heart Failure Sister   . Arthritis Brother    Social History:  reports that she quit smoking about 30 years ago. She has never used smokeless tobacco. She reports that she does not drink alcohol or use illicit drugs.  Allergies:  Allergies  Allergen Reactions  . Voltaren [Diclofenac Sodium] Shortness Of Breath  . Aspirin Other (See Comments)    ulcers  . Gabapentin Itching  . Ultram [Tramadol Hcl] Other (See Comments)    Effects cognitive abilities   . Proton Pump Inhibitors Rash    Itching, rash    Medications Prior to Admission  Medication Sig Dispense Refill  . aspirin EC 81 MG tablet Take 81 mg by mouth daily.    Marland Kitchen azelastine (ASTELIN) 0.1 % nasal spray Place 1 spray into both nostrils 2 (two) times daily. Use in each nostril as directed     . Cholecalciferol (VITAMIN D) 2000 units CAPS Take by mouth. Reported on 12/28/2015    . fluticasone (FLOVENT HFA) 110 MCG/ACT inhaler Inhale into the lungs 2 (two) times daily.    Marland Kitchen levothyroxine (SYNTHROID, LEVOTHROID) 112 MCG tablet     . ranitidine (ZANTAC) 150 MG capsule Take 150 mg by mouth 2 (two) times daily. Reported on 12/28/2015    . sertraline (ZOLOFT) 50 MG tablet Take 50 mg by mouth daily.    Marland Kitchen albuterol (PROVENTIL HFA;VENTOLIN HFA) 108 (90 Base) MCG/ACT inhaler Inhale into the lungs every 6 (six) hours as needed for wheezing or shortness of breath.    . meclizine (ANTIVERT) 25 MG tablet Take 25 mg by mouth 3 (three) times daily as needed for dizziness.      No results found for this or any previous visit (from the past 48 hour(s)). No results found.  ROS  No recent f/c/n/v/wt loss  Blood pressure 127/60, pulse 70, temperature 97.8 F (36.6 C), temperature source Oral, resp. rate 13, height 5' 7.5" (1.715 m), weight 87.998 kg (194 lb), SpO2 100 %. Physical Exam  wn wd woman in nad.  A and  o x 4.  Mood and affect normal.  EOMI.  Resp unlabored.  R foot with heatlh yskin.  No lymphdaenopthy.  5/5 strength in PF and DF of the toes.  sens to LT intact at the foot.  Assessment/Plan R talonavicular arthritis and tight heelcord.  To OR for gastroc recession and TN arthrodesis.  The risks and benefits of the alternative treatment options have been discussed in detail.  The patient wishes to proceed with surgery and specifically understands risks of bleeding, infection, nerve damage, blood clots, need for additional surgery, amputation and death.   Wylene Simmer, MD 06-09-16, 11:20 AM

## 2016-06-07 NOTE — Brief Op Note (Signed)
06/07/2016  1:33 PM  PATIENT:  Leslie [name]  75 y.o. female  PRE-OPERATIVE DIAGNOSIS:  right talonavicular arthritis; tight right heel cord   POST-OPERATIVE DIAGNOSIS:  right talonavicular arthritis; tight right heel cord   Procedure(s): RIGHT TALONAVICULAR ARTHRODESIS  RIGHT GASTROC RECESSION (separate incision) AP and latearl xrays of the right foot  SURGEON:  Wylene Simmer, MD  ASSISTANT:  Mechele Claude, PA-C  ANESTHESIA:   General, regional  EBL:  minimal   TOURNIQUET:   Total Tourniquet Time Documented: Thigh (Right) - 66 minutes Total: Thigh (Right) - 66 minutes  COMPLICATIONS:  None apparent  DISPOSITION:  Extubated, awake and stable to recovery.  DICTATION ID:  [id number]

## 2016-06-08 ENCOUNTER — Encounter (HOSPITAL_BASED_OUTPATIENT_CLINIC_OR_DEPARTMENT_OTHER): Payer: Self-pay | Admitting: Orthopedic Surgery

## 2016-06-14 DIAGNOSIS — L97519 Non-pressure chronic ulcer of other part of right foot with unspecified severity: Secondary | ICD-10-CM | POA: Diagnosis not present

## 2016-06-14 NOTE — Op Note (Signed)
NAMELAQUAYA, KIRSCHBAUM             ACCOUNT NO.:  [id number]  MEDICAL RECORD NO.:  [id number]  LOCATION:                                FACILITY:  [facility]  PHYSICIAN:  Wylene Simmer, MD        DATE OF BIRTH:  February 16, 1941  DATE OF PROCEDURE:  06/07/2016 DATE OF DISCHARGE:                              OPERATIVE REPORT   PREOPERATIVE DIAGNOSES: 1. Right talonavicular joint arthritis. 2. Right tight heel cord.  POSTOPERATIVE DIAGNOSES: 1. Right talonavicular joint arthritis. 2. Right tight heel cord.  PROCEDURE: 1. Right talonavicular joint arthrodesis. 2. Right gastrocnemius recession through a separate incision. 3. AP and lateral radiographs of the right foot.  SURGEON:  Wylene Simmer, MD  ASSISTANT:  Mechele Claude, PA-C  ANESTHESIA:  General, regional.  ESTIMATED BLOOD LOSS:  Minimal.  TOURNIQUET TIME:  66 minutes at 250 mmHg.  COMPLICATIONS:  None apparent.  DISPOSITION:  Extubated, awake, and stable to recovery.  INDICATIONS FOR PROCEDURE:  The patient is a 75 year old woman with a long history of right foot pain.  Radiographs revealed end-stage arthritis of the talonavicular joint of the right foot.  She also has a tight right heel cord.  She has failed nonoperative treatment to date including activity modification, oral anti-inflammatories, custom orthotics, steroid injections, and immobilization.  She presents now for operative treatment of this painful condition.  She understands the risks and benefits, the alternative treatment options, and elects surgical treatment.  She specifically understands risks of bleeding, infection, nerve damage, blood clots, need for additional surgery, continued pain, nonunion, amputation, and death.  PROCEDURE IN DETAIL:  After preoperative consent was obtained and the correct operative site was identified, the patient was brought to the operating room and placed supine on the operating table.  General anesthesia was induced.   Preoperative antibiotics were administered. Surgical time-out was taken.  The right lower extremity was prepped and draped in a standard sterile fashion with tourniquet around the thigh. The extremity was exsanguinated, and tourniquet was inflated to 250 mmHg.  A longitudinal incision was then made over the medial calf. Sharp dissection was carried down through the skin and subcutaneous tissue.  The gastrocnemius tendon was identified.  The plantaris tendon was divided under direct vision.  The gastrocnemius was then divided from medial to lateral under direct vision.  Care was taken to protect the sural nerve.  The wound was irrigated and closed with Monocryl and nylon.  Attention was then turned to the dorsum of the hindfoot where a longitudinal incision was made.  The interval between the extensor hallucis longus and tibialis anterior tendon was then developed.  The neurovascular bundle was protected.  The talonavicular joint was identified.  The joint capsule was elevated medially and laterally. Severe arthritic changes were noted within the joint.  The remaining articular cartilage was removed.  The wound was irrigated copiously. Subchondral bone was then perforated with a 3.5-mm drill bit on both sides of the joint leaving the resultant bone graft in place.  The subchondral bone was further broken up with a quarter-inch curved osteotome.  Approximately, 2 mL of augment graft from [facility] was then packed into the joint, and the  joint was reduced.  The joint was then fixed with 5 mm, partially threaded, cannulated screws from the [employer] screw set.  The lateral half of the joint was secured with an in- line fusion plate from the [employer] ALPS set.  This was secured distally with a locking and nonlocking screw and proximally with a compression screw and a locking screw.  Final AP and lateral radiographs confirmed appropriate reduction of the talonavicular joint and  appropriate position and length of all hardware.  The wound was irrigated copiously. The rest of bone graft was packed on the top of the joint.  The joint capsule was repaired with 2-0 Vicryl simple sutures.  The extensor retinaculum was repaired with 2-0 Vicryl simple sutures.  Subcutaneous tissues were approximated with 3-0 Monocryl, and the skin incision was closed with a running 3-0 nylon.  Sterile dressings were applied followed by a well-padded short-leg cast.  Tourniquet was released after application of the dressings at 66 minutes.  The patient was awakened from anesthesia and transported to the recovery room in stable condition.  FOLLOWUP PLAN:  The patient will be nonweightbearing on the right lower extremity.  She will follow up with me in the office in 2 weeks for suture removal.  RADIOGRAPHS:  AP and lateral radiographs of the right foot were obtained intraoperatively.  These show interval arthrodesis of the talonavicular joint and appropriately positioned hardware.  Mechele Claude, PA-C was present and scrubbed for the duration of the case.  His assistance was essential in positioning the patient, prepping and draping, gaining and maintaining exposure, performing the operation, closing and dressing the wounds.     Wylene Simmer, MD   ______________________________ Wylene Simmer, MD    JH/MEDQ  D:  06/07/2016  T:  06/08/2016  Job:  [id number]

## 2016-06-21 DIAGNOSIS — M19071 Primary osteoarthritis, right ankle and foot: Secondary | ICD-10-CM | POA: Diagnosis not present

## 2016-06-21 DIAGNOSIS — Z4789 Encounter for other orthopedic aftercare: Secondary | ICD-10-CM | POA: Diagnosis not present

## 2016-07-20 DIAGNOSIS — Z4789 Encounter for other orthopedic aftercare: Secondary | ICD-10-CM | POA: Diagnosis not present

## 2016-08-14 DIAGNOSIS — J309 Allergic rhinitis, unspecified: Secondary | ICD-10-CM | POA: Diagnosis not present

## 2016-08-14 DIAGNOSIS — M79671 Pain in right foot: Secondary | ICD-10-CM | POA: Diagnosis not present

## 2016-08-20 DIAGNOSIS — Z4789 Encounter for other orthopedic aftercare: Secondary | ICD-10-CM | POA: Diagnosis not present

## 2016-08-20 DIAGNOSIS — M6701 Short Achilles tendon (acquired), right ankle: Secondary | ICD-10-CM | POA: Diagnosis not present

## 2016-08-30 DIAGNOSIS — M19071 Primary osteoarthritis, right ankle and foot: Secondary | ICD-10-CM | POA: Diagnosis not present

## 2016-09-03 DIAGNOSIS — M19071 Primary osteoarthritis, right ankle and foot: Secondary | ICD-10-CM | POA: Diagnosis not present

## 2016-09-06 DIAGNOSIS — M19071 Primary osteoarthritis, right ankle and foot: Secondary | ICD-10-CM | POA: Diagnosis not present

## 2016-09-10 DIAGNOSIS — M19071 Primary osteoarthritis, right ankle and foot: Secondary | ICD-10-CM | POA: Diagnosis not present

## 2016-09-13 DIAGNOSIS — M19071 Primary osteoarthritis, right ankle and foot: Secondary | ICD-10-CM | POA: Diagnosis not present

## 2016-09-17 DIAGNOSIS — M19071 Primary osteoarthritis, right ankle and foot: Secondary | ICD-10-CM | POA: Diagnosis not present

## 2016-09-17 DIAGNOSIS — Z4789 Encounter for other orthopedic aftercare: Secondary | ICD-10-CM | POA: Diagnosis not present

## 2016-09-18 DIAGNOSIS — M19071 Primary osteoarthritis, right ankle and foot: Secondary | ICD-10-CM | POA: Diagnosis not present

## 2016-09-20 DIAGNOSIS — M19071 Primary osteoarthritis, right ankle and foot: Secondary | ICD-10-CM | POA: Diagnosis not present

## 2016-09-24 DIAGNOSIS — M19071 Primary osteoarthritis, right ankle and foot: Secondary | ICD-10-CM | POA: Diagnosis not present

## 2016-09-26 ENCOUNTER — Ambulatory Visit (INDEPENDENT_AMBULATORY_CARE_PROVIDER_SITE_OTHER): Payer: Medicare Other | Admitting: Neurology

## 2016-09-26 ENCOUNTER — Encounter: Payer: Self-pay | Admitting: Neurology

## 2016-09-26 VITALS — BP 130/68 | HR 74 | Ht 67.5 in | Wt 193.0 lb

## 2016-09-26 DIAGNOSIS — Z8673 Personal history of transient ischemic attack (TIA), and cerebral infarction without residual deficits: Secondary | ICD-10-CM

## 2016-09-26 NOTE — Patient Instructions (Addendum)
You look well, we can see you back in one year!  Keep up the good work with your weight loss and exercise.

## 2016-09-26 NOTE — Progress Notes (Signed)
Subjective:    [name] is a 75 y.o. female.  HPI     Interim history:   Leslie [name] is a 75 year old right-handed woman with an underlying medical history of asthma, allergies, hypothyroidism, prior TIA in 2001, macular degeneration, vitamin D deficiency, obesity, neuropathy, plantar fasciitis, and reflux disease, who presents for follow-up consultation of Leslie [name] TIA, manifested as an episode of dizziness and slurring of speech that occurred in January 2017. Leslie [name] is unaccompanied today. I last saw Leslie [name] on 03/27/2016, at which time she reported issues with allergies. She was seeing an allergy specialist and tried Flonase but had nosebleeds from it. From Leslie neuro standpoint she had no new symptoms or recurrence of symptoms. She had some problems with intermittent headaches but attributed these to sinus issues. She was trying to stay well-hydrated. We talked about secondary stroke prevention again [name]. She was advised to take a coated baby aspirin every day if tolerated, or at least every other day if possible. We talked about Leslie [name] test results including echocardiogram, carotid Doppler test results and brain MRI results. Leslie [name] exam was stable and nonfocal.  Today, 09/26/2016: She reports doing well, no recent neurological symptoms. Had foot surgery, ankle fusion in June 2017, still in physical therapy, making progress but had some difficulty with pain medication and a reaction to Ultram. No new speech issues, no other TIA type symptoms. Is able to tolerate low-dose aspirin.   Previously:   I first met Leslie [name] on 12/28/2015 at Leslie request of Leslie [name] primary care physician, at which time she reported an episode of dizziness and slurring of speech which occurred on 12/22/2015 at a grocery store accompanied by some confusion, symptoms lasting for 40 minutes altogether. She had laboratory workup at Leslie [name] primary care physician's office. She had an adjustment in Leslie [name] thyroid  medication. We talked about secondary TIA and stroke prevention [name]. I suggested further workup with a brain MRI without contrast, echocardiogram, and carotid Doppler studies.    She had a brain MRI without contrast on 12/30/2015: IMPRESSION:  This noncontrasted MRI of Leslie brain shows Leslie following: 1.   Moderate overall cortical atrophy that is more pronounced in Leslie mesial temporal lobes. 2.   Severe white matter changes most consistent with advanced chronic microvascular ischemic change. None of Leslie foci appear acute.   [name], [name] She had a carotid Doppler test on 01/04/2016 which showed no significant ICA stenosis bilaterally. [name] with Leslie [name] test results.    She had an echocardiogram on 01/10/2016 which showed no significant cardiac issues. [name] with Leslie [name] test results.    12/28/2015: She reports an episode of dizziness and slurring of speech on 12/22/2015. [name] and also had some confusion.  Leslie [name] speech difficulties are described as knowing what she wants to say but it would not come out. This lasted about 40 minutes. She did not seek immediate medical attention by going to Leslie emergency room. She had some left arm pain [name] and some left arm weakness. She was seen in your office Leslie next day on 12/23/2015. I reviewed your office note. She had blood work, including CBC with differential, CMP, PT, PTT, TSH, and EKG. You mentioned ordering carotid Doppler studies as well as a brain scan. She is currently on generic Zoloft 50 mg daily, generic Flonase twice  daily, Zantac 150 mg once daily, Proventil, meclizine as needed twice a day, Astepro, levothyroxine 100 g daily, vitamin D 2000 units once a day. She is currently not on any aspirin or cholesterol medication. She had a bleeding ulcer and was told not to take ASA. Leslie [name] lipids are  Usually borderline she  says.  She has had some reflux symptoms but had an allergic reaction to PPIs.  she tried smoking many years ago but quit. She does not drink alcohol. She tries to hydrate well. She has heel pain bilaterally. She had tendinitis and also some surgeries to both feet. This limits Leslie [name] exercise abilities. She tried swimming but she still felt that Leslie [name] foot pain exacerbated with swimming. She has had prior multiple back surgeries. This also limits Leslie [name] exercise ability and range of motion. Leslie [name] vascular risk factors are prior TIA in 2001 and obesity and she was at [facility] [name], weakness on Leslie RUE. She was at home [name], husband called EMS. He died in 03/31/2023.   I reviewed your office note from 12/23/2015, which you kindly included. She has had intermittent chest pains and some palpitations at times.   Leslie [name] Past Medical History Is Significant For: Past Medical History:  Diagnosis Date  . Asthma   . Chronic kidney disease    "stage 3 kidney failure" improving per pt  . Depression   . Hypothyroidism   . Insomnia   . Plantar fasciitis   . Stomach ulcer ~2000  . Stroke [facility])    TIA X2   2001, jan 2017  . Vitamin D deficiency     Leslie [name] Past Surgical History Is Significant For: Past Surgical History:  Procedure Laterality Date  . ABDOMINAL HYSTERECTOMY  1974  . ANKLE FUSION Right 06/07/2016   Procedure: RIGHT TALONAVICULAR ARTHRODESIS ;  Surgeon: Wylene Simmer, MD;  Location: [place];  Service: Orthopedics;  Laterality: Right;  . APPENDECTOMY    . BACK SURGERY     x4  fusion T12 thru sacrum; U rod in sacrum  . CATARACT EXTRACTION, BILATERAL Bilateral 2015  . GASTROCNEMIUS RECESSION Right 06/07/2016   Procedure: RIGHT GASTROC RECESSION ;  Surgeon: Wylene Simmer, MD;  Location: [place];  Service: Orthopedics;  Laterality: Right;  . HAND SURGERY Left 2003   x2  . L heel surgery for torn tendon Left 2015  . PLANTAR FASCIA SURGERY Right 2009   surgery at  [facility]  . SHOULDER ARTHROSCOPY Right 2007  . TENDON REPAIR Right 1949   foot- lacerated tendon in arch of foot    Leslie [name] Family History Is Significant For: Family History  Problem Relation Age of Onset  . Hypertension Mother   . Lung cancer Mother   . Lung cancer Father   . Diabetes Sister   . Hypertension Sister   . Congestive Heart Failure Sister   . Arthritis Brother     Leslie [name] Social History Is Significant For: Social History   Social History  . Marital status: Widowed    Spouse name: N/A  . Number of children: 1  . Years of education: HS+   Occupational History  . Retired     Social History Main Topics  . Smoking status: Former Smoker    Quit date: 06/04/1986  . Smokeless tobacco: Never Used     Comment: Only smoked a few years  . Alcohol use No  . Drug use: No  . Sexual activity: Not on file  Other Topics Concern  . Not on file   Social History Narrative   Drinks about 3 cups of coffee a day     Leslie [name] Allergies Are:  Allergies  Allergen Reactions  . Voltaren [Diclofenac Sodium] Shortness Of Breath  . Aspirin Other (See Comments)    ulcers  . Gabapentin Itching  . Ultram [Tramadol Hcl] Other (See Comments)    Effects cognitive abilities   . Proton Pump Inhibitors Rash    Itching, rash  :   Leslie [name] Current Medications Are:  Outpatient Encounter Prescriptions as of 09/26/2016  Medication Sig  . aspirin EC 81 MG tablet Take 81 mg by mouth daily.  Marland Kitchen azelastine (ASTELIN) 0.1 % nasal spray Place 1 spray into both nostrils 2 (two) times daily. Use in each nostril as directed  . Calcium Carbonate-Vit D-Min (CALCIUM 1200 PO) Take by mouth.  . Cholecalciferol (VITAMIN D) 2000 units CAPS Take by mouth. Reported on 12/28/2015  . levothyroxine (SYNTHROID, LEVOTHROID) 100 MCG tablet   . meclizine (ANTIVERT) 25 MG tablet Take 25 mg by mouth 3 (three) times daily as needed for dizziness.  . ranitidine (ZANTAC) 150 MG capsule Take 150 mg by mouth 2 (two) times daily. Reported  on 12/28/2015  . sertraline (ZOLOFT) 100 MG tablet   . albuterol (PROVENTIL HFA;VENTOLIN HFA) 108 (90 Base) MCG/ACT inhaler Inhale into Leslie lungs every 6 (six) hours as needed for wheezing or shortness of breath.  . [DISCONTINUED] fluticasone (FLOVENT HFA) 110 MCG/ACT inhaler Inhale into Leslie lungs 2 (two) times daily.  . [DISCONTINUED] levothyroxine (SYNTHROID, LEVOTHROID) 112 MCG tablet   . [DISCONTINUED] oxyCODONE (ROXICODONE) 5 MG immediate release tablet Take 1-2 tablets (5-10 mg total) by mouth every 4 (four) hours as needed for moderate pain or severe pain.  . [DISCONTINUED] sertraline (ZOLOFT) 50 MG tablet Take 50 mg by mouth daily.   No facility-administered encounter medications on file as of 09/26/2016.   :  Review of Systems:  Out of a complete 14 point review of systems, all are reviewed and negative with Leslie exception of these symptoms as listed below:  Review of Systems  Neurological:       [name] is taking ASA 28m everyday. No new complaints.  Had ankle fusion in June.     Objective:  Neurologic Exam  Physical Exam Physical Examination:   Vitals:   09/26/16 1514  BP: 130/68  Pulse: 74   General Examination: Leslie [name] is a very pleasant 75y.o. female in no acute distress. She appears well-developed and well-nourished and very well groomed. She is in good spirits today.  HEENT: Normocephalic, atraumatic, pupils are equal, round and reactive to light and accommodation.  She is status post bilateral cataract repairs. Extraocular tracking is good without limitation to gaze excursion or nystagmus noted. Normal smooth pursuit is noted. Hearing is grossly intact. Face is symmetric with normal facial animation and normal facial sensation. Speech is clear with no dysarthria noted, no language difficulty. There is no hypophonia. There is no lip, neck/head, jaw or voice tremor. Neck is supple with full range of passive and active motion. There are no carotid bruits on  auscultation. Oropharynx exam reveals: mild mouth dryness, adequate dental hygiene and mild airway crowding. Mallampati is class II. Tongue protrudes centrally and palate elevates symmetrically.  Chest: Clear to auscultation without wheezing, rhonchi or crackles noted.  Heart: S1+S2+0, regular and normal without murmurs, rubs or gallops noted.   Abdomen: Soft, non-tender and non-distended with normal bowel sounds appreciated  on auscultation.  Extremities: There is no pitting edema in Leslie distal lower extremities bilaterally. Pedal pulses are intact.  Skin: Warm and dry without trophic changes noted. There are  mild varicose veins, right more than left, stable.  Musculoskeletal: exam reveals no obvious joint deformities, tenderness or joint swelling or erythema, with Leslie exception of foot pain on Leslie R, s/p ankle surgery.   Neurologically:  Mental status: Leslie [name] is awake, alert and oriented in all 4 spheres. Leslie [name] immediate and remote memory, attention, language skills and fund of knowledge are appropriate. There is no evidence of aphasia, agnosia, apraxia or anomia. Speech is clear with normal prosody and enunciation. Thought process is linear. Mood is normal and affect is normal.  Cranial nerves II - XII are as described above under HEENT exam. [name]: shoulder shrug is normal with equal shoulder height noted. Motor exam: Normal bulk, strength and tone is noted. There is no drift, tremor or rebound. Romberg is negative. Reflexes are 2+ throughout, ankle on Leslie R not tested. Fine motor skills and coordination: intact with normal finger taps, normal hand movements, normal rapid alternating patting, normal foot taps and normal foot agility. and Cerebellar testing: No dysmetria or intention tremor on finger to nose testing. Heel to shin is unremarkable bilaterally. Sensory exam: intact to light touch in Leslie upper and lower extremities, still sensitive to touch in Leslie [name] right heel.  Gait,  station and balance: She stands easily. No veering to one side is noted. No leaning to one side is noted. Posture is age-appropriate and stance is narrow based. Gait shows normal stride length and normal pace. No problems turning are noted. Tandem walk is not possible.  Assessment and Plan:   In summary, Kaytelynn Scripter is a very pleasant 75 year old female with an underlying medical history of asthma, allergies, hypothyroidism, prior TIA in 2001, macular degeneration, vitamin D deficiency, obesity, neuropathy, plantar fasciitis, and reflux disease, status multiple surgeries including foot surgeries and several back surgeries, whoPresents for follow-up consultation of an episode of speech difficulty, in keeping with expressive dysphasia. Differential diagnosis included TIA. She has done rather well, no recurrence of speech difficulty, no new neurological symptoms. She has a nonfocal neurological exam. Had ankle surgery in June 2017 with residual pain reported, she continues to be in outpatient physical therapy and is making slow progress. She was not able to tolerate pain medication very well. She had a reaction to tramadol which caused Leslie [name] to go to Leslie emergency room even. She does have a history of multiple medication intolerances and reactions. Thankfully, she is able to tolerate low-dose coated aspirin on a daily basis. She has been able to lose weight, nearly 10 pounds since I last saw Leslie [name]. She is commended for this. She knows to hydrate well and is encouraged to continue to work on weight loss. She has few risk factors for vascular disease, but did have a prior TIA. Leslie [name] exam continues to be nonfocal and she is reassured in that regard. We again talked about secondary stroke prevention and TIA prevention again today. We talked about Leslie [name] workup which included carotid Doppler studies, echocardiogram and brain MRI results. Leslie [name] echocardiogram and carotid Doppler studies were unremarkable, brain MRI showed  findings in keeping with white matter changes and atrophy. I will see Leslie [name] back routinely for a checkup in one year, sooner if needed. I answered all Leslie [name] questions today and Leslie [name] was in agreement.  I spent 25 minutes in total face-to-face  time with Leslie [name], more than 50% of which was spent in counseling and coordination of care, reviewing test results, reviewing medication and discussing or reviewing Leslie diagnosis of TIA, its prognosis and treatment options.

## 2016-09-27 DIAGNOSIS — M19071 Primary osteoarthritis, right ankle and foot: Secondary | ICD-10-CM | POA: Diagnosis not present

## 2016-10-02 DIAGNOSIS — M19071 Primary osteoarthritis, right ankle and foot: Secondary | ICD-10-CM | POA: Diagnosis not present

## 2016-10-04 DIAGNOSIS — M19071 Primary osteoarthritis, right ankle and foot: Secondary | ICD-10-CM | POA: Diagnosis not present

## 2016-10-09 DIAGNOSIS — M19071 Primary osteoarthritis, right ankle and foot: Secondary | ICD-10-CM | POA: Diagnosis not present

## 2016-10-11 DIAGNOSIS — M19071 Primary osteoarthritis, right ankle and foot: Secondary | ICD-10-CM | POA: Diagnosis not present

## 2016-10-29 DIAGNOSIS — M19071 Primary osteoarthritis, right ankle and foot: Secondary | ICD-10-CM | POA: Diagnosis not present

## 2016-10-29 DIAGNOSIS — Z4789 Encounter for other orthopedic aftercare: Secondary | ICD-10-CM | POA: Diagnosis not present

## 2016-11-15 DIAGNOSIS — E785 Hyperlipidemia, unspecified: Secondary | ICD-10-CM | POA: Diagnosis not present

## 2016-11-15 DIAGNOSIS — J309 Allergic rhinitis, unspecified: Secondary | ICD-10-CM | POA: Diagnosis not present

## 2016-11-15 DIAGNOSIS — Z0000 Encounter for general adult medical examination without abnormal findings: Secondary | ICD-10-CM | POA: Diagnosis not present

## 2016-11-15 DIAGNOSIS — E559 Vitamin D deficiency, unspecified: Secondary | ICD-10-CM | POA: Diagnosis not present

## 2016-11-15 DIAGNOSIS — Z23 Encounter for immunization: Secondary | ICD-10-CM | POA: Diagnosis not present

## 2016-11-15 DIAGNOSIS — M8580 Other specified disorders of bone density and structure, unspecified site: Secondary | ICD-10-CM | POA: Diagnosis not present

## 2016-11-15 DIAGNOSIS — J45909 Unspecified asthma, uncomplicated: Secondary | ICD-10-CM | POA: Diagnosis not present

## 2016-11-15 DIAGNOSIS — F411 Generalized anxiety disorder: Secondary | ICD-10-CM | POA: Diagnosis not present

## 2016-11-15 DIAGNOSIS — N183 Chronic kidney disease, stage 3 (moderate): Secondary | ICD-10-CM | POA: Diagnosis not present

## 2016-11-15 DIAGNOSIS — E039 Hypothyroidism, unspecified: Secondary | ICD-10-CM | POA: Diagnosis not present

## 2017-01-23 DIAGNOSIS — J209 Acute bronchitis, unspecified: Secondary | ICD-10-CM | POA: Diagnosis not present

## 2017-10-01 ENCOUNTER — Ambulatory Visit (INDEPENDENT_AMBULATORY_CARE_PROVIDER_SITE_OTHER): Payer: Medicare Other | Admitting: Neurology

## 2017-10-01 ENCOUNTER — Encounter: Payer: Self-pay | Admitting: Neurology

## 2017-10-01 VITALS — BP 147/80 | HR 83 | Ht 67.0 in | Wt 201.0 lb

## 2017-10-01 DIAGNOSIS — Z8673 Personal history of transient ischemic attack (TIA), and cerebral infarction without residual deficits: Secondary | ICD-10-CM | POA: Diagnosis not present

## 2017-10-01 NOTE — Progress Notes (Signed)
Subjective:    Patient ID: Leslie [name] is a 76 y.o. female.  HPI     Interim history:   Leslie [name] is a 76 year old right-handed woman with an underlying medical history of asthma, allergies, hypothyroidism, prior TIA in 2001, macular degeneration, vitamin D deficiency, obesity, neuropathy, plantar fasciitis, and reflux disease, who presents for follow-up consultation of her TIA, manifested as an episode of dizziness and slurring of speech that occurred in January 2017. The patient is unaccompanied today. I last saw her on 09/26/2016, at which time she reported doing well, had no recent neurological symptoms, was in physical therapy after ankle fusion in June 2017. I suggested a one-year recheck, she was able to tolerate low-dose aspirin. She was encouraged to monitor her weight and exercise regularly. She had lost some weight.   Today, 10/01/2017: She reports doing okay, no new Sx, but has some stress, feels stable from the neuro standpoint. She had R foot surgery in June 2017. Cannot take tramadol. She on a smaller dose of Zoloft, 50 mg nightly. She feels like she has mild memory loss, no recent falls, some issues with balance secondary to not being able to roll her right foot. She also has joint pain and aching.  The patient's allergies, current medications, family history, past medical history, past social history, past surgical history and problem list were reviewed and updated as appropriate.   Previously (copied from previous notes for reference):   I saw her on 03/27/2016, at which time she reported issues with allergies. She was seeing an allergy specialist and tried Flonase but had nosebleeds from it. From the neuro standpoint she had no new symptoms or recurrence of symptoms. She had some problems with intermittent headaches but attributed these to sinus issues. She was trying to stay well-hydrated. We talked about secondary stroke prevention again at the time. She was advised  to take a coated baby aspirin every day if tolerated, or at least every other day if possible. We talked about her test results including echocardiogram, carotid Doppler test results and brain MRI results. Her exam was stable and nonfocal.   I first met her on 12/28/2015 at the request of her primary care physician, at which time she reported an episode of dizziness and slurring of speech which occurred on 12/22/2015 at a grocery store accompanied by some confusion, symptoms lasting for 40 minutes altogether. She had laboratory workup at her primary care physician's office. She had an adjustment in her thyroid medication. We talked about secondary TIA and stroke prevention at the time. I suggested further workup with a brain MRI without contrast, echocardiogram, and carotid Doppler studies.    She had a brain MRI without contrast on 12/30/2015: IMPRESSION:  This noncontrasted MRI of the brain shows the following: 1.   Moderate overall cortical atrophy that is more pronounced in the mesial temporal lobes. 2.   Severe white matter changes most consistent with advanced chronic microvascular ischemic change. None of the foci appear acute.   [name], I personally reviewed the images through the PACS syst we called her with her test results. She had a carotid Doppler test on 01/04/2016 which showed no significant ICA stenosis bilaterally. We called her with her test results.    She had an echocardiogram on 01/10/2016 which showed no significant cardiac issues. We called her with her test results.    12/28/2015: She reports an episode of dizziness and slurring of speech on 12/22/2015. She was at the grocery store  and also had some confusion.  Her speech difficulties are described as knowing what she wants to say but it would not come out. This lasted about 40 minutes. She did not seek immediate medical attention by going to the emergency room. She had some left arm pain at the time and some left arm  weakness. She was seen in your office the next day on 12/23/2015. I reviewed your office note. She had blood work, including CBC with differential, CMP, PT, PTT, TSH, and EKG. You mentioned ordering carotid Doppler studies as well as a brain scan. She is currently on generic Zoloft 50 mg daily, generic Flonase twice daily, Zantac 150 mg once daily, Proventil, meclizine as needed twice a day, Astepro, levothyroxine 100 g daily, vitamin D 2000 units once a day. She is currently not on any aspirin or cholesterol medication. She had a bleeding ulcer and was told not to take ASA. Her lipids are  Usually borderline she says.  She has had some reflux symptoms but had an allergic reaction to PPIs.  she tried smoking many years ago but quit. She does not drink alcohol. She tries to hydrate well. She has heel pain bilaterally. She had tendinitis and also some surgeries to both feet. This limits her exercise abilities. She tried swimming but she still felt that her foot pain exacerbated with swimming. She has had prior multiple back surgeries. This also limits her exercise ability and range of motion. Her vascular risk factors are prior TIA in 2001 and obesity and she was at [facility] at the time, weakness on the RUE. She was at home at the time, husband called EMS. He died in March 13, 2023.   I reviewed your office note from 12/23/2015, which you kindly included. She has had intermittent chest pains and some palpitations at times.  Her Past Medical History Is Significant For: Past Medical History:  Diagnosis Date  . Asthma   . Chronic kidney disease    "stage 3 kidney failure" improving per pt  . Depression   . Hypothyroidism   . Insomnia   . Plantar fasciitis   . Stomach ulcer ~2000  . Stroke [facility])    TIA X2   2001, jan 2017  . Vitamin D deficiency     Her Past Surgical History Is Significant For: Past Surgical History:  Procedure Laterality Date  . ABDOMINAL HYSTERECTOMY  1974  . ANKLE FUSION Right  06/07/2016   Procedure: RIGHT TALONAVICULAR ARTHRODESIS ;  Surgeon: Wylene Simmer, MD;  Location: [place];  Service: Orthopedics;  Laterality: Right;  . APPENDECTOMY    . BACK SURGERY     x4  fusion T12 thru sacrum; U rod in sacrum  . CATARACT EXTRACTION, BILATERAL Bilateral 2015  . GASTROCNEMIUS RECESSION Right 06/07/2016   Procedure: RIGHT GASTROC RECESSION ;  Surgeon: Wylene Simmer, MD;  Location: [place];  Service: Orthopedics;  Laterality: Right;  . HAND SURGERY Left 2003   x2  . L heel surgery for torn tendon Left 2015  . PLANTAR FASCIA SURGERY Right 2009   surgery at [facility]  . SHOULDER ARTHROSCOPY Right 2007  . TENDON REPAIR Right 1949   foot- lacerated tendon in arch of foot    Her Family History Is Significant For: Family History  Problem Relation Age of Onset  . Hypertension Mother   . Lung cancer Mother   . Lung cancer Father   . Diabetes Sister   . Hypertension Sister   . Congestive  Heart Failure Sister   . Arthritis Brother     Her Social History Is Significant For: Social History   Social History  . Marital status: Widowed    Spouse name: N/A  . Number of children: 1  . Years of education: HS+   Occupational History  . Retired     Social History Main Topics  . Smoking status: Former Smoker    Quit date: 06/04/1986  . Smokeless tobacco: Never Used     Comment: Only smoked a few years  . Alcohol use No  . Drug use: No  . Sexual activity: Not Asked   Other Topics Concern  . None   Social History Narrative   Drinks about 3 cups of coffee a day     Her Allergies Are:  Allergies  Allergen Reactions  . Voltaren [Diclofenac Sodium] Shortness Of Breath  . Aspirin Other (See Comments)    ulcers  . Gabapentin Itching  . Ultram [Tramadol Hcl] Other (See Comments)    Effects cognitive abilities   . Proton Pump Inhibitors Rash    Itching, rash  :   Her Current Medications Are:  Outpatient Encounter Prescriptions as of  10/01/2017  Medication Sig  . albuterol (PROVENTIL HFA;VENTOLIN HFA) 108 (90 Base) MCG/ACT inhaler Inhale into the lungs every 6 (six) hours as needed for wheezing or shortness of breath.  Marland Kitchen aspirin EC 81 MG tablet Take 81 mg by mouth daily.  Marland Kitchen azelastine (ASTELIN) 0.1 % nasal spray Place 1 spray into both nostrils 2 (two) times daily. Use in each nostril as directed  . Calcium Carbonate-Vit D-Min (CALCIUM 1200 PO) Take by mouth.  . Cholecalciferol (VITAMIN D3) 3000 units TABS Take 3,000 Units by mouth.  . levothyroxine (SYNTHROID, LEVOTHROID) 100 MCG tablet   . meclizine (ANTIVERT) 25 MG tablet Take 25 mg by mouth 3 (three) times daily as needed for dizziness.  . ranitidine (ZANTAC) 150 MG capsule Take 150 mg by mouth 2 (two) times daily. Reported on 12/28/2015  . sertraline (ZOLOFT) 50 MG tablet Take 50 mg by mouth daily.  . [DISCONTINUED] Cholecalciferol (VITAMIN D) 2000 units CAPS Take by mouth. Reported on 12/28/2015  . [DISCONTINUED] sertraline (ZOLOFT) 100 MG tablet    No facility-administered encounter medications on file as of 10/01/2017.   : Review of Systems:  Out of a complete 14 point review of systems, all are reviewed and negative with the exception of these symptoms as listed below:  Review of Systems  Neurological:       Pt presents today to follow up on her TIAs. Pt reports that things are going well.    Objective:  Neurological Exam  Physical Exam Physical Examination:   Vitals:   10/01/17 1500  BP: (!) 147/80  Pulse: 83    General Examination: The patient is a very pleasant 76 y.o. female in no acute distress. She appears well-developed and well-nourished and well groomed. Good spirits.   HEENT: Normocephalic, atraumatic, pupils are equal, round and reactive to light and accommodation.  She is status post bilateral cataract repairs. Extraocular tracking is good without limitation to gaze excursion or nystagmus noted. Normal smooth pursuit is noted. Hearing is  grossly intact. Face is symmetric with normal facial animation and normal facial sensation. Speech is clear with no dysarthria noted, no language difficulty. There is no hypophonia. There is no lip, neck/head, jaw or voice tremor. Neck is supple with full range of passive and active motion. There are no carotid bruits on  auscultation. Oropharynx exam reveals: mild to moderate mouth dryness, adequate dental hygiene. Tongue protrudes centrally and palate elevates symmetrically.  Chest: Clear to auscultation without wheezing, rhonchi or crackles noted.  Heart: S1+S2+0, regular and normal without murmurs, rubs or gallops noted.   Abdomen: Soft, non-tender and non-distended with normal bowel sounds appreciated on auscultation.  Extremities: There is no pitting edema in the distal lower extremities bilaterally. Pedal pulses are intact.  Skin: Warm and dry without trophic changes noted. There are mild varicose veins, right more than left, stable.  Musculoskeletal: exam reveals no obvious joint deformities, tenderness or joint swelling or erythema, with the exception of foot pain on the R, s/p ankle surgery.   Neurologically:  Mental status: The patient is awake, alert and oriented in all 4 spheres. Her immediate and remote memory, attention, language skills and fund of knowledge are appropriate. There is no evidence of aphasia, agnosia, apraxia or anomia. Speech is clear with normal prosody and enunciation. Thought process is linear. Mood is normal and affect is normal.  Cranial nerves II - XII are as described above under HEENT exam. [name]: shoulder shrug is normal with equal shoulder height noted. Motor exam: Normal bulk, strength and tone is noted. There is no drift, tremor or rebound. Romberg is negative. Reflexes are 1-2+ throughout, ankle on the R not tested. Fine motor skills and coordination: intact with normal finger taps, normal hand movements, normal rapid alternating patting,  normal foot taps and normal foot agility.  Cerebellar testing: No dysmetria or intention tremor. Heel to shin is unremarkable bilaterally. Sensory exam: intact to light touch in the upper and lower extremities, not as  sensitive to touch in her right heel.  Gait, station and balance: She stands easily. No veering to one side is noted. No leaning to one side is noted. Posture is age-appropriate and stance is narrow based. Gait shows normal stride length and normal pace. No problems turning are noted. Tandem walk is not possible.  Assessment and Plan:   In summary, Romayne Ticas is a very pleasant 76 year old female with a complex medical history of asthma, allergies, hypothyroidism, prior TIA in 2001, macular degeneration, vitamin D deficiency, obesity, neuropathy, plantar fasciitis, and reflux disease, fall with injury in the distant past, status multiple surgeries including foot surgeries and several back surgeries, who presents for follow-up consultation of an episode of speech difficulty, in keeping with expressive dysphasia with differential diagnosis including TIA. She continues to do well, no recurrence of speech difficulty, no new neurological symptoms. She has a non-focal neurological exam. She had ankle surgery in June 2017 with residual pain, but improved with time. She had a reaction to tramadol which caused her to go to the emergency room. She is able to tolerate baby aspirin. We talked about her most recent brain MRI and I explained the finding of the arachnoid cyst to her as well. I shared some images on the computer with her. She knows to hydrate well and is encouraged to continue to eat healthy and exercise regularly. She has few risk factors for vascular disease, but did have a prior TIA. Her exam continues to be nonfocal and she is reassured in that regard. We again talked about secondary stroke prevention and TIA prevention again today. We previously talked about her workup which  included carotid Doppler studies, echocardiogram and brain MRI. Her echocardiogram and carotid Doppler studies were unremarkable, brain MRI showed findings in keeping with white matter changes and atrophy. At this  juncture, I can see her back as needed. I answered all her questions today and the patient was in agreement.  I spent 25 minutes in total face-to-face time with the patient, more than 50% of which was spent in counseling and coordination of care, reviewing test results, reviewing medication and discussing or reviewing the diagnosis of TIA, its prognosis and treatment options. Pertinent laboratory and imaging test results that were available during this visit with the patient were reviewed by me and considered in my medical decision making (see chart for details).

## 2017-10-01 NOTE — Patient Instructions (Addendum)
You look well today!  Your exam is stable from my standpoint.  Try to exercise on a regular basis.  Continue to maintain good nutrition and good hydration.

## 2017-10-29 DIAGNOSIS — Z23 Encounter for immunization: Secondary | ICD-10-CM | POA: Diagnosis not present

## 2017-11-14 DIAGNOSIS — Z1231 Encounter for screening mammogram for malignant neoplasm of breast: Secondary | ICD-10-CM | POA: Diagnosis not present

## 2018-08-29 DIAGNOSIS — J309 Allergic rhinitis, unspecified: Secondary | ICD-10-CM | POA: Diagnosis not present

## 2018-08-29 DIAGNOSIS — R05 Cough: Secondary | ICD-10-CM | POA: Diagnosis not present

## 2018-08-29 DIAGNOSIS — J3089 Other allergic rhinitis: Secondary | ICD-10-CM | POA: Diagnosis not present

## 2018-09-06 DIAGNOSIS — I10 Essential (primary) hypertension: Secondary | ICD-10-CM | POA: Diagnosis not present

## 2018-09-06 DIAGNOSIS — E669 Obesity, unspecified: Secondary | ICD-10-CM | POA: Diagnosis not present

## 2018-09-06 DIAGNOSIS — M79671 Pain in right foot: Secondary | ICD-10-CM | POA: Diagnosis not present

## 2018-09-06 DIAGNOSIS — J309 Allergic rhinitis, unspecified: Secondary | ICD-10-CM | POA: Diagnosis not present

## 2018-09-06 DIAGNOSIS — M549 Dorsalgia, unspecified: Secondary | ICD-10-CM | POA: Diagnosis not present

## 2018-09-06 DIAGNOSIS — E559 Vitamin D deficiency, unspecified: Secondary | ICD-10-CM | POA: Diagnosis not present

## 2018-09-06 DIAGNOSIS — E039 Hypothyroidism, unspecified: Secondary | ICD-10-CM | POA: Diagnosis not present

## 2018-09-06 DIAGNOSIS — E785 Hyperlipidemia, unspecified: Secondary | ICD-10-CM | POA: Diagnosis not present

## 2018-09-06 DIAGNOSIS — F3340 Major depressive disorder, recurrent, in remission, unspecified: Secondary | ICD-10-CM | POA: Diagnosis not present

## 2018-10-09 DIAGNOSIS — M25511 Pain in right shoulder: Secondary | ICD-10-CM | POA: Diagnosis not present

## 2018-10-29 DIAGNOSIS — M25511 Pain in right shoulder: Secondary | ICD-10-CM | POA: Diagnosis not present

## 2018-12-30 DIAGNOSIS — M25511 Pain in right shoulder: Secondary | ICD-10-CM | POA: Diagnosis not present

## 2019-02-03 DIAGNOSIS — F411 Generalized anxiety disorder: Secondary | ICD-10-CM | POA: Diagnosis not present

## 2019-02-03 DIAGNOSIS — M545 Low back pain: Secondary | ICD-10-CM | POA: Diagnosis not present

## 2019-02-03 DIAGNOSIS — E039 Hypothyroidism, unspecified: Secondary | ICD-10-CM | POA: Diagnosis not present

## 2019-02-03 DIAGNOSIS — E785 Hyperlipidemia, unspecified: Secondary | ICD-10-CM | POA: Diagnosis not present

## 2019-02-03 DIAGNOSIS — R42 Dizziness and giddiness: Secondary | ICD-10-CM | POA: Diagnosis not present

## 2019-02-03 DIAGNOSIS — N183 Chronic kidney disease, stage 3 (moderate): Secondary | ICD-10-CM | POA: Diagnosis not present

## 2019-02-03 DIAGNOSIS — G4700 Insomnia, unspecified: Secondary | ICD-10-CM | POA: Diagnosis not present

## 2019-02-03 DIAGNOSIS — E559 Vitamin D deficiency, unspecified: Secondary | ICD-10-CM | POA: Diagnosis not present

## 2019-04-21 ENCOUNTER — Other Ambulatory Visit: Payer: Self-pay | Admitting: Family Medicine

## 2019-04-21 ENCOUNTER — Ambulatory Visit
Admission: RE | Admit: 2019-04-21 | Discharge: 2019-04-21 | Disposition: A | Discharge status: Home / Self Care | Payer: Medicare Other | Source: Ambulatory Visit | Attending: Family Medicine | Admitting: Family Medicine

## 2019-04-21 ENCOUNTER — Other Ambulatory Visit: Payer: Self-pay

## 2019-04-21 DIAGNOSIS — W19XXXA Unspecified fall, initial encounter: Secondary | ICD-10-CM | POA: Diagnosis not present

## 2019-04-21 DIAGNOSIS — S66191A Other injury of flexor muscle, fascia and tendon of left index finger at wrist and hand level, initial encounter: Secondary | ICD-10-CM | POA: Diagnosis not present

## 2019-04-21 DIAGNOSIS — S6991XA Unspecified injury of right wrist, hand and finger(s), initial encounter: Secondary | ICD-10-CM | POA: Diagnosis not present

## 2019-05-18 DIAGNOSIS — R42 Dizziness and giddiness: Secondary | ICD-10-CM | POA: Diagnosis not present

## 2019-05-18 DIAGNOSIS — H8110 Benign paroxysmal vertigo, unspecified ear: Secondary | ICD-10-CM | POA: Diagnosis not present

## 2019-05-26 DIAGNOSIS — H903 Sensorineural hearing loss, bilateral: Secondary | ICD-10-CM | POA: Diagnosis not present

## 2019-05-26 DIAGNOSIS — R42 Dizziness and giddiness: Secondary | ICD-10-CM | POA: Diagnosis not present

## 2019-06-08 DIAGNOSIS — H8113 Benign paroxysmal vertigo, bilateral: Secondary | ICD-10-CM | POA: Diagnosis not present

## 2019-06-15 DIAGNOSIS — H8113 Benign paroxysmal vertigo, bilateral: Secondary | ICD-10-CM | POA: Diagnosis not present

## 2019-06-22 DIAGNOSIS — H8113 Benign paroxysmal vertigo, bilateral: Secondary | ICD-10-CM | POA: Diagnosis not present

## 2020-09-29 ENCOUNTER — Other Ambulatory Visit: Payer: Self-pay | Admitting: Gastroenterology

## 2020-09-29 DIAGNOSIS — K5792 Diverticulitis of intestine, part unspecified, without perforation or abscess without bleeding: Secondary | ICD-10-CM

## 2020-09-29 DIAGNOSIS — Z86010 Personal history of colonic polyps: Secondary | ICD-10-CM

## 2020-10-31 ENCOUNTER — Ambulatory Visit: Payer: Medicare Other

## 2020-11-23 ENCOUNTER — Ambulatory Visit: Payer: Medicare Other

## 2021-04-17 ENCOUNTER — Other Ambulatory Visit: Payer: Self-pay | Admitting: Family Medicine

## 2021-04-17 ENCOUNTER — Ambulatory Visit
Admission: RE | Admit: 2021-04-17 | Discharge: 2021-04-17 | Disposition: A | Discharge status: Home / Self Care | Payer: Medicare HMO | Source: Ambulatory Visit | Attending: Family Medicine | Admitting: Family Medicine

## 2021-04-17 DIAGNOSIS — M25552 Pain in left hip: Secondary | ICD-10-CM

## 2021-04-17 DIAGNOSIS — M62830 Muscle spasm of back: Secondary | ICD-10-CM

## 2021-05-29 ENCOUNTER — Other Ambulatory Visit: Payer: Self-pay | Admitting: Family

## 2021-05-29 DIAGNOSIS — M7989 Other specified soft tissue disorders: Secondary | ICD-10-CM

## 2021-05-30 ENCOUNTER — Ambulatory Visit
Admission: RE | Admit: 2021-05-30 | Discharge: 2021-05-30 | Disposition: A | Discharge status: Home / Self Care | Payer: Medicare HMO | Source: Ambulatory Visit | Attending: Family | Admitting: Family

## 2021-05-30 DIAGNOSIS — M7989 Other specified soft tissue disorders: Secondary | ICD-10-CM

## 2021-06-01 ENCOUNTER — Other Ambulatory Visit: Payer: Medicare HMO

## 2021-08-07 ENCOUNTER — Other Ambulatory Visit: Payer: Self-pay | Admitting: Student

## 2021-08-07 DIAGNOSIS — M25552 Pain in left hip: Secondary | ICD-10-CM

## 2021-08-11 ENCOUNTER — Other Ambulatory Visit: Payer: Self-pay

## 2021-08-11 ENCOUNTER — Ambulatory Visit
Admission: RE | Admit: 2021-08-11 | Discharge: 2021-08-11 | Disposition: A | Discharge status: Home / Self Care | Payer: Medicare HMO | Source: Ambulatory Visit | Attending: Student | Admitting: Student

## 2021-08-11 DIAGNOSIS — M25552 Pain in left hip: Secondary | ICD-10-CM

## 2021-09-05 ENCOUNTER — Ambulatory Visit: Payer: Self-pay | Admitting: Student

## 2021-09-19 ENCOUNTER — Ambulatory Visit: Payer: Self-pay | Admitting: Student

## 2021-09-19 NOTE — H&P (Signed)
TOTAL HIP ADMISSION H&P  Patient is admitted for left total hip arthroplasty.  Subjective:  Chief Complaint: left hip pain  HPI: Leslie [name], 80 y.o. female, has a history of pain and functional disability in the left hip(s) due to arthritis and patient has failed non-surgical conservative treatments for greater than 12 weeks to include corticosteriod injections and activity modification.  Onset of symptoms was gradual starting 1 years ago with gradually worsening course since that time.The patient noted no past surgery on the left hip(s).  Patient currently rates pain in the left hip at 9 out of 10 with activity. Patient has worsening of pain with activity and weight bearing, pain that interfers with activities of daily living, and pain with passive range of motion. Patient has evidence of subchondral cysts, subchondral sclerosis, and joint space narrowing by imaging studies. This condition presents safety issues increasing the risk of falls. There is no current active infection.  There are no problems to display for this patient.  Past Medical History:  Diagnosis Date   Asthma    Chronic kidney disease    "stage 3 kidney failure" improving per pt   Depression    Hypothyroidism    Insomnia    Plantar fasciitis    Stomach ulcer ~2000   Stroke [facility])    TIA X2   2001, jan 2017   Vitamin D deficiency     Past Surgical History:  Procedure Laterality Date   ABDOMINAL HYSTERECTOMY  1974   ANKLE FUSION Right 06/07/2016   Procedure: RIGHT TALONAVICULAR ARTHRODESIS ;  Surgeon: Wylene Simmer, MD;  Location: [place];  Service: Orthopedics;  Laterality: Right;   APPENDECTOMY     BACK SURGERY     x4  fusion T12 thru sacrum; U rod in sacrum   CATARACT EXTRACTION, BILATERAL Bilateral 2015   GASTROCNEMIUS RECESSION Right 06/07/2016   Procedure: RIGHT GASTROC RECESSION ;  Surgeon: Wylene Simmer, MD;  Location: [place];  Service: Orthopedics;  Laterality:  Right;   HAND SURGERY Left 2003   x2   L heel surgery for torn tendon Left 2015   PLANTAR FASCIA SURGERY Right 2009   surgery at [place] Right 2007   TENDON REPAIR Right 1949   foot- lacerated tendon in arch of foot    Current Outpatient Medications  Medication Sig Dispense Refill Last Dose   albuterol (PROVENTIL HFA;VENTOLIN HFA) 108 (90 Base) MCG/ACT inhaler Inhale into the lungs every 6 (six) hours as needed for wheezing or shortness of breath.      aspirin EC 81 MG tablet Take 81 mg by mouth daily.      azelastine (ASTELIN) 0.1 % nasal spray Place 1 spray into both nostrils 2 (two) times daily. Use in each nostril as directed      Calcium Carbonate-Vit D-Min (CALCIUM 1200 PO) Take by mouth.      Cholecalciferol (VITAMIN D3) 3000 units TABS Take 3,000 Units by mouth.      levothyroxine (SYNTHROID, LEVOTHROID) 100 MCG tablet       meclizine (ANTIVERT) 25 MG tablet Take 25 mg by mouth 3 (three) times daily as needed for dizziness.      ranitidine (ZANTAC) 150 MG capsule Take 150 mg by mouth 2 (two) times daily. Reported on 12/28/2015      sertraline (ZOLOFT) 50 MG tablet Take 50 mg by mouth daily.      No current facility-administered medications for this visit.   Allergies  Allergen Reactions   Voltaren [Diclofenac Sodium] Shortness Of Breath   Aspirin Other (See Comments)    ulcers   Gabapentin Itching   Ultram [Tramadol Hcl] Other (See Comments)    Effects cognitive abilities    Proton Pump Inhibitors Rash    Itching, rash    Social History   Tobacco Use   Smoking status: Former    Types: Cigarettes    Quit date: 06/04/1986    Years since quitting: 35.3   Smokeless tobacco: Never   Tobacco comments:    Only smoked a few years  Substance Use Topics   Alcohol use: No    Alcohol/week: 0.0 standard drinks    Family History  Problem Relation Age of Onset   Hypertension Mother    Lung cancer Mother    Lung cancer Father    Diabetes Sister     Hypertension Sister    Congestive Heart Failure Sister    Arthritis Brother      Review of Systems  Musculoskeletal:  Positive for arthralgias and back pain.  Neurological:        Intermittent bouts of vertigo  All other systems reviewed and are negative.  Objective:  Physical Exam HENT:     Head: Normocephalic.  Eyes:     Pupils: Pupils are equal, round, and reactive to light.  Cardiovascular:     Rate and Rhythm: Normal rate.     Pulses: Normal pulses.  Pulmonary:     Effort: Pulmonary effort is normal.  Abdominal:     Palpations: Abdomen is soft.  Genitourinary:    Comments: Deferred Musculoskeletal:     Cervical back: Neck supple.     Comments: Painful ROM left hip  Skin:    General: Skin is warm and dry.  Neurological:     Mental Status: She is alert and oriented to person, place, and time.  Psychiatric:        Behavior: Behavior normal.    Vital signs in last 24 hours: @VSRANGES @  Labs:   Estimated body mass index is 31.48 kg/m as calculated from the following:   Height as of 10/01/17: 5\' 7"  (1.702 m).   Weight as of 10/01/17: 91.2 kg.   Imaging Review Plain radiographs demonstrate severe degenerative joint disease of the left hip(s). The bone quality appears to be adequate for age and reported activity level.      Assessment/Plan:  End stage arthritis, left hip(s)  The patient history, physical examination, clinical judgement of the provider and imaging studies are consistent with end stage degenerative joint disease of the left hip(s) and total hip arthroplasty is deemed medically necessary. The treatment options including medical management, injection therapy, arthroscopy and arthroplasty were discussed at length. The risks and benefits of total hip arthroplasty were presented and reviewed. The risks due to aseptic loosening, infection, stiffness, dislocation/subluxation,  thromboembolic complications and other imponderables were discussed.  The  patient acknowledged the explanation, agreed to proceed with the plan and consent was signed. Patient is being admitted for inpatient treatment for surgery, pain control, PT, OT, prophylactic antibiotics, VTE prophylaxis, progressive ambulation and ADL's and discharge planning.The patient is planning to be discharged  home after overnight observation

## 2021-09-19 NOTE — H&P (View-Only) (Signed)
TOTAL HIP ADMISSION H&P  Patient is admitted for left total hip arthroplasty.  Subjective:  Chief Complaint: left hip pain  HPI: Leslie [name], 80 y.o. female, has a history of pain and functional disability in the left hip(s) due to arthritis and patient has failed non-surgical conservative treatments for greater than 12 weeks to include corticosteriod injections and activity modification.  Onset of symptoms was gradual starting 1 years ago with gradually worsening course since that time.The patient noted no past surgery on the left hip(s).  Patient currently rates pain in the left hip at 9 out of 10 with activity. Patient has worsening of pain with activity and weight bearing, pain that interfers with activities of daily living, and pain with passive range of motion. Patient has evidence of subchondral cysts, subchondral sclerosis, and joint space narrowing by imaging studies. This condition presents safety issues increasing the risk of falls. There is no current active infection.  There are no problems to display for this patient.  Past Medical History:  Diagnosis Date   Asthma    Chronic kidney disease    "stage 3 kidney failure" improving per pt   Depression    Hypothyroidism    Insomnia    Plantar fasciitis    Stomach ulcer ~2000   Stroke [facility])    TIA X2   2001, jan 2017   Vitamin D deficiency     Past Surgical History:  Procedure Laterality Date   ABDOMINAL HYSTERECTOMY  1974   ANKLE FUSION Right 06/07/2016   Procedure: RIGHT TALONAVICULAR ARTHRODESIS ;  Surgeon: Wylene Simmer, MD;  Location: [place];  Service: Orthopedics;  Laterality: Right;   APPENDECTOMY     BACK SURGERY     x4  fusion T12 thru sacrum; U rod in sacrum   CATARACT EXTRACTION, BILATERAL Bilateral 2015   GASTROCNEMIUS RECESSION Right 06/07/2016   Procedure: RIGHT GASTROC RECESSION ;  Surgeon: Wylene Simmer, MD;  Location: [place];  Service: Orthopedics;  Laterality:  Right;   HAND SURGERY Left 2003   x2   L heel surgery for torn tendon Left 2015   PLANTAR FASCIA SURGERY Right 2009   surgery at [facility] Right 2007   TENDON REPAIR Right 1949   foot- lacerated tendon in arch of foot    Current Outpatient Medications  Medication Sig Dispense Refill Last Dose   albuterol (PROVENTIL HFA;VENTOLIN HFA) 108 (90 Base) MCG/ACT inhaler Inhale into the lungs every 6 (six) hours as needed for wheezing or shortness of breath.      aspirin EC 81 MG tablet Take 81 mg by mouth daily.      azelastine (ASTELIN) 0.1 % nasal spray Place 1 spray into both nostrils 2 (two) times daily. Use in each nostril as directed      Calcium Carbonate-Vit D-Min (CALCIUM 1200 PO) Take by mouth.      Cholecalciferol (VITAMIN D3) 3000 units TABS Take 3,000 Units by mouth.      levothyroxine (SYNTHROID, LEVOTHROID) 100 MCG tablet       meclizine (ANTIVERT) 25 MG tablet Take 25 mg by mouth 3 (three) times daily as needed for dizziness.      ranitidine (ZANTAC) 150 MG capsule Take 150 mg by mouth 2 (two) times daily. Reported on 12/28/2015      sertraline (ZOLOFT) 50 MG tablet Take 50 mg by mouth daily.      No current facility-administered medications for this visit.   Allergies  Allergen Reactions   Voltaren [Diclofenac Sodium] Shortness Of Breath   Aspirin Other (See Comments)    ulcers   Gabapentin Itching   Ultram [Tramadol Hcl] Other (See Comments)    Effects cognitive abilities    Proton Pump Inhibitors Rash    Itching, rash    Social History   Tobacco Use   Smoking status: Former    Types: Cigarettes    Quit date: 06/04/1986    Years since quitting: 35.3   Smokeless tobacco: Never   Tobacco comments:    Only smoked a few years  Substance Use Topics   Alcohol use: No    Alcohol/week: 0.0 standard drinks    Family History  Problem Relation Age of Onset   Hypertension Mother    Lung cancer Mother    Lung cancer Father    Diabetes Sister     Hypertension Sister    Congestive Heart Failure Sister    Arthritis Brother      Review of Systems  Musculoskeletal:  Positive for arthralgias and back pain.  Neurological:        Intermittent bouts of vertigo  All other systems reviewed and are negative.  Objective:  Physical Exam HENT:     Head: Normocephalic.  Eyes:     Pupils: Pupils are equal, round, and reactive to light.  Cardiovascular:     Rate and Rhythm: Normal rate.     Pulses: Normal pulses.  Pulmonary:     Effort: Pulmonary effort is normal.  Abdominal:     Palpations: Abdomen is soft.  Genitourinary:    Comments: Deferred Musculoskeletal:     Cervical back: Neck supple.     Comments: Painful ROM left hip  Skin:    General: Skin is warm and dry.  Neurological:     Mental Status: She is alert and oriented to person, place, and time.  Psychiatric:        Behavior: Behavior normal.    Vital signs in last 24 hours: @VSRANGES @  Labs:   Estimated body mass index is 31.48 kg/m as calculated from the following:   Height as of 10/01/17: 5\' 7"  (1.702 m).   Weight as of 10/01/17: 91.2 kg.   Imaging Review Plain radiographs demonstrate severe degenerative joint disease of the left hip(s). The bone quality appears to be adequate for age and reported activity level.      Assessment/Plan:  End stage arthritis, left hip(s)  The patient history, physical examination, clinical judgement of the provider and imaging studies are consistent with end stage degenerative joint disease of the left hip(s) and total hip arthroplasty is deemed medically necessary. The treatment options including medical management, injection therapy, arthroscopy and arthroplasty were discussed at length. The risks and benefits of total hip arthroplasty were presented and reviewed. The risks due to aseptic loosening, infection, stiffness, dislocation/subluxation,  thromboembolic complications and other imponderables were discussed.  The  patient acknowledged the explanation, agreed to proceed with the plan and consent was signed. Patient is being admitted for inpatient treatment for surgery, pain control, PT, OT, prophylactic antibiotics, VTE prophylaxis, progressive ambulation and ADL's and discharge planning.The patient is planning to be discharged  home after overnight observation

## 2021-09-21 NOTE — Progress Notes (Signed)
DUE TO COVID-19 ONLY ONE VISITOR IS ALLOWED TO COME WITH YOU AND STAY IN THE WAITING ROOM ONLY DURING PRE OP AND PROCEDURE DAY OF SURGERY.  2 VISITOR  MAY VISIT WITH YOU AFTER SURGERY IN YOUR PRIVATE ROOM DURING VISITING HOURS ONLY!  YOU NEED TO HAVE A COVID 19 TEST ON__10/24/22 @_  @_from  8am-3pm _____, THIS TEST MUST BE DONE BEFORE SURGERY,  Covid test is done at [place], [place] [address].  This is a drive thru.  No appt required. Please see map.                 Your procedure is scheduled on:  10/05/2021   Report to [facility] Main  Entrance   Report to admitting at    [phone]     Call this number if you have problems the morning of surgery [phone]    REMEMBER: NO  SOLID FOOD CANDY OR GUM AFTER MIDNIGHT. CLEAR LIQUIDS UNTIL   0730am         . NOTHING BY MOUTH EXCEPT CLEAR LIQUIDS UNTIL 0730am    . PLEASE FINISH ENSURE DRINK PER SURGEON ORDER  WHICH NEEDS TO BE COMPLETED AT    0730am   .      CLEAR LIQUID DIET   Foods Allowed                                                                    Coffee and tea, regular and decaf                            Fruit ices (not with fruit pulp)                                      Iced Popsicles                                    Carbonated beverages, regular and diet                                    Cranberry, grape and apple juices Sports drinks like Gatorade Lightly seasoned clear broth or consume(fat free) Sugar, honey syrup ___________________________________________________________________      BRUSH YOUR TEETH MORNING OF SURGERY AND RINSE YOUR MOUTH OUT, NO CHEWING GUM CANDY OR MINTS.     Take these medicines the morning of surgery with A SIP OF WATER:   synthroid, pepcid   DO NOT TAKE ANY DIABETIC MEDICATIONS DAY OF YOUR SURGERY                               You may not have any metal on your body including hair pins and              piercings  Do not wear jewelry, make-up, lotions,  powders or perfumes, deodorant  Do not wear nail polish on your fingernails.  Do not shave  48 hours prior to surgery.              Men may shave face and neck.   Do not bring valuables to the hospital. [place].  Contacts, dentures or bridgework may not be worn into surgery.  Leave suitcase in the car. After surgery it may be brought to your room.     Patients discharged the day of surgery will not be allowed to drive home. IF YOU ARE HAVING SURGERY AND GOING HOME THE SAME DAY, YOU MUST HAVE AN ADULT TO DRIVE YOU HOME AND BE WITH YOU FOR 24 HOURS. YOU MAY GO HOME BY TAXI OR UBER OR ORTHERWISE, BUT AN ADULT MUST ACCOMPANY YOU HOME AND STAY WITH YOU FOR 24 HOURS.  Name and phone number of your driver:  Special Instructions: N/A              Please read over the following fact sheets you were given: _____________________________________________________________________  [facility] - Preparing for Surgery Before surgery, you can play an important role.  Because skin is not sterile, your skin needs to be as free of germs as possible.  You can reduce the number of germs on your skin by washing with CHG (chlorahexidine gluconate) soap before surgery.  CHG is an antiseptic cleaner which kills germs and bonds with the skin to continue killing germs even after washing. Please DO NOT use if you have an allergy to CHG or antibacterial soaps.  If your skin becomes reddened/irritated stop using the CHG and inform your nurse when you arrive at Short Stay. Do not shave (including legs and underarms) for at least 48 hours prior to the first CHG shower.  You may shave your face/neck. Please follow these instructions carefully:  1.  Shower with CHG Soap the night before surgery and the  morning of Surgery.  2.  If you choose to wash your hair, wash your hair first as usual with your  normal  shampoo.  3.  After you shampoo, rinse your hair and body  thoroughly to remove the  shampoo.                           4.  Use CHG as you would any other liquid soap.  You can apply chg directly  to the skin and wash                       Gently with a scrungie or clean washcloth.  5.  Apply the CHG Soap to your body ONLY FROM THE NECK DOWN.   Do not use on face/ open                           Wound or open sores. Avoid contact with eyes, ears mouth and genitals (private parts).                       Wash face,  Genitals (private parts) with your normal soap.             6.  Wash thoroughly, paying special attention to the area where your surgery  will be performed.  7.  Thoroughly rinse your body with  warm water from the neck down.  8.  DO NOT shower/wash with your normal soap after using and rinsing off  the CHG Soap.                9.  Pat yourself dry with a clean towel.            10.  Wear clean pajamas.            11.  Place clean sheets on your bed the night of your first shower and do not  sleep with pets. Day of Surgery : Do not apply any lotions/deodorants the morning of surgery.  Please wear clean clothes to the hospital/surgery center.  FAILURE TO FOLLOW THESE INSTRUCTIONS MAY RESULT IN THE CANCELLATION OF YOUR SURGERY PATIENT SIGNATURE_________________________________  NURSE SIGNATURE__________________________________  ________________________________________________________________________

## 2021-09-21 NOTE — Progress Notes (Addendum)
Anesthesia Review:  PCP: DR Dustin Folks at [place] dated 08/28/21 on chart along with OV note .  Cardiologist : Chest x-ray : EKG : 08/28/2021 on chart  Echo : Stress test: Cardiac Cath :  Activity level: can do a flight of stairs without difficulty  Sleep Study/ CPAP : none  Fasting Blood Sugar :      / Checks Blood Sugar -- times a day:   Blood Thinner/ Instructions /Last Dose: ASA / Instructions/ Last Dose :   Covid test- 10/02/21

## 2021-09-25 ENCOUNTER — Encounter (HOSPITAL_COMMUNITY): Payer: Self-pay

## 2021-09-25 ENCOUNTER — Encounter (HOSPITAL_COMMUNITY)
Admission: RE | Admit: 2021-09-25 | Discharge: 2021-09-25 | Disposition: A | Discharge status: Home / Self Care | Payer: Medicare HMO | Source: Ambulatory Visit | Attending: Orthopedic Surgery | Admitting: Orthopedic Surgery

## 2021-09-25 ENCOUNTER — Other Ambulatory Visit: Payer: Self-pay

## 2021-09-25 DIAGNOSIS — Z79899 Other long term (current) drug therapy: Secondary | ICD-10-CM | POA: Insufficient documentation

## 2021-09-25 DIAGNOSIS — Z01812 Encounter for preprocedural laboratory examination: Secondary | ICD-10-CM | POA: Insufficient documentation

## 2021-09-25 DIAGNOSIS — Z79890 Hormone replacement therapy (postmenopausal): Secondary | ICD-10-CM | POA: Insufficient documentation

## 2021-09-25 DIAGNOSIS — E039 Hypothyroidism, unspecified: Secondary | ICD-10-CM | POA: Diagnosis not present

## 2021-09-25 DIAGNOSIS — M1612 Unilateral primary osteoarthritis, left hip: Secondary | ICD-10-CM | POA: Diagnosis not present

## 2021-09-25 DIAGNOSIS — Z8673 Personal history of transient ischemic attack (TIA), and cerebral infarction without residual deficits: Secondary | ICD-10-CM | POA: Diagnosis not present

## 2021-09-25 DIAGNOSIS — N183 Chronic kidney disease, stage 3 unspecified: Secondary | ICD-10-CM | POA: Diagnosis not present

## 2021-09-25 DIAGNOSIS — Z87891 Personal history of nicotine dependence: Secondary | ICD-10-CM | POA: Diagnosis not present

## 2021-09-25 HISTORY — DX: Unspecified osteoarthritis, unspecified site: M19.90

## 2021-09-25 HISTORY — DX: Headache, unspecified: R51.9

## 2021-09-25 LAB — SURGICAL PCR SCREEN
MRSA, PCR: NEGATIVE
Staphylococcus aureus: POSITIVE — AB

## 2021-09-25 LAB — CBC
HCT: 41.2 % (ref 36.0–46.0)
Hemoglobin: 13 g/dL (ref 12.0–15.0)
MCH: 28.6 pg (ref 26.0–34.0)
MCHC: 31.6 g/dL (ref 30.0–36.0)
MCV: 90.5 fL (ref 80.0–100.0)
Platelets: 329 10*3/uL (ref 150–400)
RBC: 4.55 MIL/uL (ref 3.87–5.11)
RDW: 14.1 % (ref 11.5–15.5)
WBC: 9.6 10*3/uL (ref 4.0–10.5)
nRBC: 0 % (ref 0.0–0.2)

## 2021-09-25 LAB — COMPREHENSIVE METABOLIC PANEL
ALT: 15 U/L (ref 0–44)
AST: 20 U/L (ref 15–41)
Albumin: 4.1 g/dL (ref 3.5–5.0)
Alkaline Phosphatase: 73 U/L (ref 38–126)
Anion gap: 7 (ref 5–15)
BUN: 18 mg/dL (ref 8–23)
CO2: 27 mmol/L (ref 22–32)
Calcium: 9.8 mg/dL (ref 8.9–10.3)
Chloride: 107 mmol/L (ref 98–111)
Creatinine, Ser: 0.9 mg/dL (ref 0.44–1.00)
GFR, Estimated: >60 mL/min (ref >60)
Glucose, Bld: 104 mg/dL — ABNORMAL HIGH (ref 70–99)
Potassium: 3.8 mmol/L (ref 3.5–5.1)
Sodium: 141 mmol/L (ref 135–145)
Total Bilirubin: 0.8 mg/dL (ref 0.3–1.2)
Total Protein: 7.9 g/dL (ref 6.5–8.1)

## 2021-09-25 LAB — PROTIME-INR
INR: 1 (ref 0.8–1.2)
Prothrombin Time: 12.8 seconds (ref 11.4–15.2)

## 2021-09-26 NOTE — Progress Notes (Signed)
Anesthesia Chart Review   Case: [id number] Date/Time: 10/05/21 1015   Procedure: TOTAL HIP ARTHROPLASTY ANTERIOR APPROACH (Left: Hip)   Anesthesia type: Spinal   Pre-op diagnosis: Left hip osteoarthritis   [facility]: [facility] ROOM 08 / [facility]   Surgeons: Rod Can, MD       DISCUSSION:80 y.o. former smoker with h/o CKD Stage III, hypothyroidism, left hip OA scheduled for above procedure 10/05/2021 with Dr. Rod Can.   Clearance received from PCP which states pt is low risk for planned procedure.   Anticipate pt can proceed with planned procedure barring acute status change.   VS: BP 126/84   Pulse 90   Temp 36.7 C (Oral)   Resp 16   Ht 5\' 7"  (1.702 m)   Wt 78 kg   SpO2 99%   BMI 26.94 kg/m   PROVIDERS: Sonia Side., FNP is PCP    LABS: Labs reviewed: Acceptable for surgery. (all labs ordered are listed, but only abnormal results are displayed)  Labs Reviewed  SURGICAL PCR SCREEN - Abnormal; Notable for the following components:      Result Value   Staphylococcus aureus POSITIVE (*)    All other components within normal limits  COMPREHENSIVE METABOLIC PANEL - Abnormal; Notable for the following components:   Glucose, Bld 104 (*)    All other components within normal limits  CBC  PROTIME-INR  TYPE AND SCREEN     IMAGES:   EKG:   CV: Echo 01/10/2016 - Left ventricle: The cavity size was normal. Wall thickness was    increased in a pattern of moderate LVH. The estimated ejection    fraction was 55%. Wall motion was normal; there were no regional    wall motion abnormalities. Doppler parameters are consistent with    abnormal left ventricular relaxation (grade 1 diastolic    dysfunction).  - Aortic valve: There was no stenosis.  - Mitral valve: There was trivial regurgitation.  - Right ventricle: The cavity size was normal. Systolic function    was normal.  - Tricuspid valve: Peak RV-RA gradient (S): 24 mm Hg.  - Pulmonary arteries: PA peak  pressure: 27 mm Hg (S).  - Inferior vena cava: The vessel was normal in size. The    respirophasic diameter changes were in the normal range (>= 50%),    consistent with normal central venous pressure.  Past Medical History:  Diagnosis Date   Arthritis    Chronic kidney disease    "stage 3 kidney failure" improving per pt   Headache    Hypothyroidism    Insomnia    Plantar fasciitis    Stomach ulcer ~2000   Stroke [facility])    TIA X2   2001, jan 2017   Vitamin D deficiency     Past Surgical History:  Procedure Laterality Date   ABDOMINAL HYSTERECTOMY  1974   ANKLE FUSION Right 06/07/2016   Procedure: RIGHT TALONAVICULAR ARTHRODESIS ;  Surgeon: Wylene Simmer, MD;  Location: [place];  Service: Orthopedics;  Laterality: Right;   APPENDECTOMY     BACK SURGERY     x4  fusion T12 thru sacrum; U rod in sacrum   CATARACT EXTRACTION, BILATERAL Bilateral 2015   GASTROCNEMIUS RECESSION Right 06/07/2016   Procedure: RIGHT GASTROC RECESSION ;  Surgeon: Wylene Simmer, MD;  Location: [place];  Service: Orthopedics;  Laterality: Right;   HAND SURGERY Left 2003   x2   L heel surgery for torn tendon Left  2015   PLANTAR FASCIA SURGERY Right 2009   surgery at [place] Right 2007   TENDON REPAIR Right 1949   foot- lacerated tendon in arch of foot    MEDICATIONS:  acetaminophen (TYLENOL) 500 MG tablet   alendronate (FOSAMAX) 70 MG tablet   Cholecalciferol (VITAMIN D) 50 MCG (2000 UT) tablet   COLLAGEN PO   famotidine (PEPCID) 20 MG tablet   Humidifier/Vaporizer Supplies (VICKS VAPORIZER SCENT) PADS   levothyroxine (SYNTHROID) 88 MCG tablet   Polyethyl Glycol-Propyl Glycol (SYSTANE OP)   No current facility-administered medications for this encounter.     Konrad Felix Ward, PA-C [facility] Pre-Surgical Testing [phone]

## 2021-10-02 ENCOUNTER — Other Ambulatory Visit: Payer: Self-pay | Admitting: Orthopedic Surgery

## 2021-10-02 LAB — SARS CORONAVIRUS 2 (TAT 6-24 HRS): SARS Coronavirus 2: NEGATIVE

## 2021-10-05 ENCOUNTER — Encounter (HOSPITAL_COMMUNITY)
Admission: RE | Disposition: A | Discharge status: Home / Self Care | Payer: Self-pay | Source: Ambulatory Visit | Attending: Orthopedic Surgery

## 2021-10-05 ENCOUNTER — Encounter (HOSPITAL_COMMUNITY): Payer: Self-pay | Admitting: Orthopedic Surgery

## 2021-10-05 ENCOUNTER — Ambulatory Visit (HOSPITAL_COMMUNITY): Payer: Medicare HMO | Admitting: Physician Assistant

## 2021-10-05 ENCOUNTER — Other Ambulatory Visit: Payer: Self-pay

## 2021-10-05 ENCOUNTER — Ambulatory Visit (HOSPITAL_COMMUNITY): Payer: Medicare HMO

## 2021-10-05 ENCOUNTER — Ambulatory Visit (HOSPITAL_COMMUNITY): Payer: Medicare HMO | Admitting: Certified Registered Nurse Anesthetist

## 2021-10-05 ENCOUNTER — Ambulatory Visit (HOSPITAL_COMMUNITY)
Admission: RE | Admit: 2021-10-05 | Discharge: 2021-10-06 | Disposition: A | Discharge status: Home / Self Care | Payer: Medicare HMO | Source: Ambulatory Visit | Attending: Orthopedic Surgery | Admitting: Orthopedic Surgery

## 2021-10-05 DIAGNOSIS — Z886 Allergy status to analgesic agent status: Secondary | ICD-10-CM | POA: Insufficient documentation

## 2021-10-05 DIAGNOSIS — Z79899 Other long term (current) drug therapy: Secondary | ICD-10-CM | POA: Diagnosis not present

## 2021-10-05 DIAGNOSIS — M1612 Unilateral primary osteoarthritis, left hip: Secondary | ICD-10-CM | POA: Insufficient documentation

## 2021-10-05 DIAGNOSIS — Z419 Encounter for procedure for purposes other than remedying health state, unspecified: Secondary | ICD-10-CM

## 2021-10-05 DIAGNOSIS — Z888 Allergy status to other drugs, medicaments and biological substances status: Secondary | ICD-10-CM | POA: Diagnosis not present

## 2021-10-05 DIAGNOSIS — Z8261 Family history of arthritis: Secondary | ICD-10-CM | POA: Insufficient documentation

## 2021-10-05 DIAGNOSIS — Z8249 Family history of ischemic heart disease and other diseases of the circulatory system: Secondary | ICD-10-CM | POA: Diagnosis not present

## 2021-10-05 DIAGNOSIS — Z7901 Long term (current) use of anticoagulants: Secondary | ICD-10-CM | POA: Diagnosis not present

## 2021-10-05 DIAGNOSIS — Z87891 Personal history of nicotine dependence: Secondary | ICD-10-CM | POA: Insufficient documentation

## 2021-10-05 DIAGNOSIS — Z801 Family history of malignant neoplasm of trachea, bronchus and lung: Secondary | ICD-10-CM | POA: Insufficient documentation

## 2021-10-05 DIAGNOSIS — Z09 Encounter for follow-up examination after completed treatment for conditions other than malignant neoplasm: Secondary | ICD-10-CM

## 2021-10-05 DIAGNOSIS — Z7982 Long term (current) use of aspirin: Secondary | ICD-10-CM | POA: Insufficient documentation

## 2021-10-05 DIAGNOSIS — Z833 Family history of diabetes mellitus: Secondary | ICD-10-CM | POA: Diagnosis not present

## 2021-10-05 DIAGNOSIS — N183 Chronic kidney disease, stage 3 unspecified: Secondary | ICD-10-CM | POA: Diagnosis not present

## 2021-10-05 HISTORY — PX: TOTAL HIP ARTHROPLASTY: SHX124

## 2021-10-05 LAB — ABO/RH: ABO/RH(D): O NEG

## 2021-10-05 LAB — TYPE AND SCREEN
ABO/RH(D): O NEG
Antibody Screen: NEGATIVE

## 2021-10-05 SURGERY — ARTHROPLASTY, HIP, TOTAL, ANTERIOR APPROACH
Anesthesia: Monitor Anesthesia Care | Site: Hip | Laterality: Left

## 2021-10-05 MED ORDER — MORPHINE SULFATE (PF) 2 MG/ML IV SOLN: 0.5000 mg | INTRAVENOUS | Status: DC | PRN

## 2021-10-05 MED ORDER — FAMOTIDINE 20 MG PO TABS
20.0000 mg | ORAL_TABLET | Freq: Two times a day (BID) | ORAL | Status: DC
  Administered 2021-10-05 – 2021-10-06 (×2): 20 mg via ORAL
  Filled 2021-10-05 (×2): qty 1

## 2021-10-05 MED ORDER — FENTANYL CITRATE (PF) 100 MCG/2ML IJ SOLN
INTRAMUSCULAR | Status: AC
  Filled 2021-10-05: qty 2

## 2021-10-05 MED ORDER — DEXAMETHASONE SODIUM PHOSPHATE 10 MG/ML IJ SOLN
10.0000 mg | Freq: Once | INTRAMUSCULAR | Status: AC
  Administered 2021-10-06: 10 mg via INTRAVENOUS
  Filled 2021-10-05: qty 1

## 2021-10-05 MED ORDER — ONDANSETRON HCL 4 MG/2ML IJ SOLN
INTRAMUSCULAR | Status: DC | PRN
  Administered 2021-10-05: 4 mg via INTRAVENOUS

## 2021-10-05 MED ORDER — ACETAMINOPHEN 10 MG/ML IV SOLN: 1000.0000 mg | Freq: Once | INTRAVENOUS | Status: DC | PRN

## 2021-10-05 MED ORDER — VICKS VAPORIZER SCENT PADS: 1.0000 | MEDICATED_PAD | Freq: Every day | Status: DC | PRN

## 2021-10-05 MED ORDER — METHOCARBAMOL 500 MG IVPB - SIMPLE MED
500.0000 mg | Freq: Four times a day (QID) | INTRAVENOUS | Status: DC | PRN
  Filled 2021-10-05: qty 50

## 2021-10-05 MED ORDER — CEFAZOLIN SODIUM-DEXTROSE 2-4 GM/100ML-% IV SOLN
2.0000 g | Freq: Four times a day (QID) | INTRAVENOUS | Status: AC
  Administered 2021-10-05 (×2): 2 g via INTRAVENOUS
  Filled 2021-10-05 (×2): qty 100

## 2021-10-05 MED ORDER — HYDROMORPHONE HCL 1 MG/ML IJ SOLN
INTRAMUSCULAR | Status: DC | PRN
  Administered 2021-10-05 (×2): .5 mg via INTRAVENOUS

## 2021-10-05 MED ORDER — TRANEXAMIC ACID-NACL 1000-0.7 MG/100ML-% IV SOLN
1000.0000 mg | INTRAVENOUS | Status: AC
  Administered 2021-10-05: 1000 mg via INTRAVENOUS
  Filled 2021-10-05: qty 100

## 2021-10-05 MED ORDER — DEXAMETHASONE SODIUM PHOSPHATE 10 MG/ML IJ SOLN
INTRAMUSCULAR | Status: DC | PRN
  Administered 2021-10-05: 10 mg via INTRAVENOUS

## 2021-10-05 MED ORDER — FLEET ENEMA 7-19 GM/118ML RE ENEM: 1.0000 | ENEMA | Freq: Once | RECTAL | Status: DC | PRN

## 2021-10-05 MED ORDER — PHENYLEPHRINE HCL (PRESSORS) 10 MG/ML IV SOLN
INTRAVENOUS | Status: DC | PRN
  Administered 2021-10-05: 120 ug via INTRAVENOUS
  Administered 2021-10-05: 160 ug via INTRAVENOUS

## 2021-10-05 MED ORDER — CEFAZOLIN SODIUM-DEXTROSE 2-4 GM/100ML-% IV SOLN
2.0000 g | INTRAVENOUS | Status: AC
  Administered 2021-10-05: 2 g via INTRAVENOUS
  Filled 2021-10-05: qty 100

## 2021-10-05 MED ORDER — SODIUM CHLORIDE 0.9 % IR SOLN
Status: DC | PRN
  Administered 2021-10-05: 1000 mL

## 2021-10-05 MED ORDER — BUPIVACAINE-EPINEPHRINE (PF) 0.25% -1:200000 IJ SOLN
INTRAMUSCULAR | Status: AC
  Filled 2021-10-05: qty 30

## 2021-10-05 MED ORDER — METOCLOPRAMIDE HCL 5 MG/ML IJ SOLN
5.0000 mg | Freq: Three times a day (TID) | INTRAMUSCULAR | Status: DC | PRN
Start: 2021-10-05 — End: 2021-10-06
  Administered 2021-10-05: 10 mg via INTRAVENOUS
  Filled 2021-10-05: qty 2

## 2021-10-05 MED ORDER — ONDANSETRON HCL 4 MG PO TABS: 4.0000 mg | ORAL_TABLET | Freq: Four times a day (QID) | ORAL | Status: DC | PRN

## 2021-10-05 MED ORDER — POLYETHYLENE GLYCOL 3350 17 G PO PACK: 17.0000 g | PACK | Freq: Every day | ORAL | Status: DC | PRN

## 2021-10-05 MED ORDER — SUGAMMADEX SODIUM 200 MG/2ML IV SOLN
INTRAVENOUS | Status: DC | PRN
  Administered 2021-10-05: 200 mg via INTRAVENOUS

## 2021-10-05 MED ORDER — SODIUM CHLORIDE 0.9 % IV SOLN: INTRAVENOUS | Status: DC

## 2021-10-05 MED ORDER — POLYETHYL GLYCOL-PROPYL GLYCOL 0.4-0.3 % OP GEL
Freq: Every day | OPHTHALMIC | Status: DC | PRN
  Filled 2021-10-05: qty 10

## 2021-10-05 MED ORDER — LEVOTHYROXINE SODIUM 88 MCG PO TABS
88.0000 ug | ORAL_TABLET | Freq: Every day | ORAL | Status: DC
  Administered 2021-10-06: 88 ug via ORAL
  Filled 2021-10-05: qty 1

## 2021-10-05 MED ORDER — GLYCOPYRROLATE PF 0.2 MG/ML IJ SOSY
PREFILLED_SYRINGE | INTRAMUSCULAR | Status: DC | PRN
  Administered 2021-10-05: .2 mg via INTRAVENOUS

## 2021-10-05 MED ORDER — ISOPROPYL ALCOHOL 70 % SOLN
Status: DC | PRN
  Administered 2021-10-05: 1 via TOPICAL

## 2021-10-05 MED ORDER — ACETAMINOPHEN 10 MG/ML IV SOLN
1000.0000 mg | Freq: Once | INTRAVENOUS | Status: AC
  Administered 2021-10-05: 1000 mg via INTRAVENOUS
  Filled 2021-10-05: qty 100

## 2021-10-05 MED ORDER — SORBITOL 70 % SOLN
30.0000 mL | Freq: Every day | Status: DC | PRN
  Filled 2021-10-05: qty 30

## 2021-10-05 MED ORDER — HYDROCODONE-ACETAMINOPHEN 5-325 MG PO TABS
1.0000 | ORAL_TABLET | ORAL | Status: DC | PRN
  Administered 2021-10-06: 1 via ORAL
  Administered 2021-10-06 (×2): 2 via ORAL
  Filled 2021-10-05 (×2): qty 2
  Filled 2021-10-05: qty 1

## 2021-10-05 MED ORDER — VITAMIN D3 25 MCG (1000 UNIT) PO TABS
2000.0000 [IU] | ORAL_TABLET | Freq: Every day | ORAL | Status: DC
  Administered 2021-10-06: 2000 [IU] via ORAL
  Filled 2021-10-05 (×2): qty 2

## 2021-10-05 MED ORDER — POVIDONE-IODINE 10 % EX SWAB
2.0000 "application " | Freq: Once | CUTANEOUS | Status: AC
  Administered 2021-10-05: 2 via TOPICAL

## 2021-10-05 MED ORDER — WATER FOR IRRIGATION, STERILE IR SOLN
Status: DC | PRN
  Administered 2021-10-05: 2000 mL

## 2021-10-05 MED ORDER — CHLORHEXIDINE GLUCONATE 0.12 % MT SOLN
15.0000 mL | Freq: Once | OROMUCOSAL | Status: AC
  Administered 2021-10-05: 15 mL via OROMUCOSAL

## 2021-10-05 MED ORDER — ACETAMINOPHEN 325 MG PO TABS: 325.0000 mg | ORAL_TABLET | Freq: Four times a day (QID) | ORAL | Status: DC | PRN

## 2021-10-05 MED ORDER — SODIUM CHLORIDE (PF) 0.9 % IJ SOLN
INTRAMUSCULAR | Status: DC | PRN
  Administered 2021-10-05: 30 mL

## 2021-10-05 MED ORDER — HYDROMORPHONE HCL 2 MG/ML IJ SOLN
INTRAMUSCULAR | Status: AC
  Filled 2021-10-05: qty 1

## 2021-10-05 MED ORDER — TRANEXAMIC ACID-NACL 1000-0.7 MG/100ML-% IV SOLN
1000.0000 mg | Freq: Once | INTRAVENOUS | Status: AC
  Administered 2021-10-05: 1000 mg via INTRAVENOUS
  Filled 2021-10-05: qty 100

## 2021-10-05 MED ORDER — FENTANYL CITRATE PF 50 MCG/ML IJ SOSY
PREFILLED_SYRINGE | INTRAMUSCULAR | Status: AC
  Filled 2021-10-05: qty 1

## 2021-10-05 MED ORDER — LACTATED RINGERS IV SOLN
INTRAVENOUS | Status: DC
Start: 2021-10-05 — End: 2021-10-05

## 2021-10-05 MED ORDER — METOCLOPRAMIDE HCL 5 MG PO TABS: 5.0000 mg | ORAL_TABLET | Freq: Three times a day (TID) | ORAL | Status: DC | PRN

## 2021-10-05 MED ORDER — DOCUSATE SODIUM 100 MG PO CAPS
100.0000 mg | ORAL_CAPSULE | Freq: Two times a day (BID) | ORAL | Status: DC
  Administered 2021-10-05 – 2021-10-06 (×2): 100 mg via ORAL
  Filled 2021-10-05 (×2): qty 1

## 2021-10-05 MED ORDER — APIXABAN 2.5 MG PO TABS
2.5000 mg | ORAL_TABLET | Freq: Two times a day (BID) | ORAL | Status: DC
  Administered 2021-10-06: 2.5 mg via ORAL
  Filled 2021-10-05: qty 1

## 2021-10-05 MED ORDER — ONDANSETRON HCL 4 MG/2ML IJ SOLN
4.0000 mg | Freq: Four times a day (QID) | INTRAMUSCULAR | Status: DC | PRN
  Administered 2021-10-05 – 2021-10-06 (×2): 4 mg via INTRAVENOUS
  Filled 2021-10-05 (×2): qty 2

## 2021-10-05 MED ORDER — FENTANYL CITRATE PF 50 MCG/ML IJ SOSY
25.0000 ug | PREFILLED_SYRINGE | INTRAMUSCULAR | Status: DC | PRN
  Administered 2021-10-05: 25 ug via INTRAVENOUS

## 2021-10-05 MED ORDER — METHOCARBAMOL 500 MG PO TABS
500.0000 mg | ORAL_TABLET | Freq: Four times a day (QID) | ORAL | Status: DC | PRN
  Administered 2021-10-06: 500 mg via ORAL
  Filled 2021-10-05: qty 1

## 2021-10-05 MED ORDER — ALUM & MAG HYDROXIDE-SIMETH 200-200-20 MG/5ML PO SUSP: 30.0000 mL | ORAL | Status: DC | PRN

## 2021-10-05 MED ORDER — ROCURONIUM BROMIDE 100 MG/10ML IV SOLN
INTRAVENOUS | Status: DC | PRN
  Administered 2021-10-05: 50 mg via INTRAVENOUS

## 2021-10-05 MED ORDER — PROPOFOL 10 MG/ML IV BOLUS
INTRAVENOUS | Status: DC | PRN
  Administered 2021-10-05: 30 mg via INTRAVENOUS
  Administered 2021-10-05: 120 mg via INTRAVENOUS
  Administered 2021-10-05: 20 mg via INTRAVENOUS

## 2021-10-05 MED ORDER — POVIDONE-IODINE 10 % EX SWAB: 2.0000 "application " | Freq: Once | CUTANEOUS | Status: AC

## 2021-10-05 MED ORDER — PHENOL 1.4 % MT LIQD: 1.0000 | OROMUCOSAL | Status: DC | PRN

## 2021-10-05 MED ORDER — MENTHOL 3 MG MT LOZG: 1.0000 | LOZENGE | OROMUCOSAL | Status: DC | PRN

## 2021-10-05 MED ORDER — HYDROCODONE-ACETAMINOPHEN 7.5-325 MG PO TABS: 1.0000 | ORAL_TABLET | ORAL | Status: DC | PRN

## 2021-10-05 MED ORDER — BUPIVACAINE-EPINEPHRINE 0.25% -1:200000 IJ SOLN
INTRAMUSCULAR | Status: DC | PRN
  Administered 2021-10-05: 30 mL

## 2021-10-05 MED ORDER — FENTANYL CITRATE (PF) 100 MCG/2ML IJ SOLN
INTRAMUSCULAR | Status: DC | PRN
  Administered 2021-10-05 (×4): 50 ug via INTRAVENOUS

## 2021-10-05 MED ORDER — ORAL CARE MOUTH RINSE: 15.0000 mL | Freq: Once | OROMUCOSAL | Status: AC

## 2021-10-05 MED ORDER — KETOROLAC TROMETHAMINE 30 MG/ML IJ SOLN
INTRAMUSCULAR | Status: AC
  Filled 2021-10-05: qty 1

## 2021-10-05 MED ORDER — DIPHENHYDRAMINE HCL 12.5 MG/5ML PO ELIX: 12.5000 mg | ORAL_SOLUTION | ORAL | Status: DC | PRN

## 2021-10-05 MED ORDER — SENNA 8.6 MG PO TABS
1.0000 | ORAL_TABLET | Freq: Two times a day (BID) | ORAL | Status: DC
  Administered 2021-10-05 – 2021-10-06 (×2): 8.6 mg via ORAL
  Filled 2021-10-05 (×2): qty 1

## 2021-10-05 SURGICAL SUPPLY — 58 items
ACETAB CUP W/GRIPTION 54 (Plate) ×2 IMPLANT
BAG COUNTER SPONGE SURGICOUNT (BAG) IMPLANT
BAG DECANTER FOR FLEXI CONT (MISCELLANEOUS) IMPLANT
BAG ZIPLOCK 12X15 (MISCELLANEOUS) IMPLANT
CHLORAPREP W/TINT 26 (MISCELLANEOUS) ×2 IMPLANT
COVER PERINEAL POST (MISCELLANEOUS) ×2 IMPLANT
COVER SURGICAL LIGHT HANDLE (MISCELLANEOUS) ×2 IMPLANT
CUP ACETAB W/GRIPTION 54 (Plate) ×1 IMPLANT
DECANTER SPIKE VIAL GLASS SM (MISCELLANEOUS) ×2 IMPLANT
DERMABOND ADVANCED (GAUZE/BANDAGES/DRESSINGS) ×1
DERMABOND ADVANCED .7 DNX12 (GAUZE/BANDAGES/DRESSINGS) ×1 IMPLANT
DRAPE IMP U-DRAPE 54X76 (DRAPES) ×2 IMPLANT
DRAPE SHEET LG 3/4 BI-LAMINATE (DRAPES) ×6 IMPLANT
DRAPE STERI IOBAN 125X83 (DRAPES) ×2 IMPLANT
DRAPE U-SHAPE 47X51 STRL (DRAPES) ×4 IMPLANT
DRSG AQUACEL AG ADV 3.5X10 (GAUZE/BANDAGES/DRESSINGS) ×2 IMPLANT
ELECT REM PT RETURN 15FT ADLT (MISCELLANEOUS) ×2 IMPLANT
GAUZE SPONGE 4X4 12PLY STRL (GAUZE/BANDAGES/DRESSINGS) ×2 IMPLANT
GLOVE SRG 8 PF TXTR STRL LF DI (GLOVE) ×1 IMPLANT
GLOVE SURG ENC MOIS LTX SZ8.5 (GLOVE) ×4 IMPLANT
GLOVE SURG ENC TEXT LTX SZ7.5 (GLOVE) ×4 IMPLANT
GLOVE SURG UNDER POLY LF SZ8 (GLOVE) ×2
GLOVE SURG UNDER POLY LF SZ8.5 (GLOVE) ×2 IMPLANT
GOWN SPEC L3 XXLG W/TWL (GOWN DISPOSABLE) ×2 IMPLANT
GOWN STRL REUS W/TWL XL LVL3 (GOWN DISPOSABLE) ×2 IMPLANT
HANDPIECE INTERPULSE COAX TIP (DISPOSABLE) ×2
HEAD M SROM 36MM PLUS 1.5 (Hips) ×1 IMPLANT
HOLDER FOLEY CATH W/STRAP (MISCELLANEOUS) ×2 IMPLANT
HOOD PEEL AWAY FLYTE STAYCOOL (MISCELLANEOUS) ×8 IMPLANT
JET LAVAGE IRRISEPT WOUND (IRRIGATION / IRRIGATOR) ×2
KIT TURNOVER KIT A (KITS) IMPLANT
LAVAGE JET IRRISEPT WOUND (IRRIGATION / IRRIGATOR) ×1 IMPLANT
LINER NEUTRAL 54X36MM PLUS 4 (Hips) ×2 IMPLANT
MANIFOLD NEPTUNE II (INSTRUMENTS) ×2 IMPLANT
MARKER SKIN DUAL TIP RULER LAB (MISCELLANEOUS) ×2 IMPLANT
NDL SAFETY ECLIPSE 18X1.5 (NEEDLE) ×1 IMPLANT
NEEDLE HYPO 18GX1.5 SHARP (NEEDLE) ×2
NEEDLE SPNL 18GX3.5 QUINCKE PK (NEEDLE) ×2 IMPLANT
PACK ANTERIOR HIP CUSTOM (KITS) ×2 IMPLANT
PENCIL SMOKE EVACUATOR (MISCELLANEOUS) IMPLANT
SAW OSC TIP CART 19.5X105X1.3 (SAW) ×2 IMPLANT
SEALER BIPOLAR AQUA 6.0 (INSTRUMENTS) ×2 IMPLANT
SET HNDPC FAN SPRY TIP SCT (DISPOSABLE) ×1 IMPLANT
SROM M HEAD 36MM PLUS 1.5 (Hips) ×2 IMPLANT
STEM TRI LOC BPS GRIP SZ9 OFFS ×1 IMPLANT
SUT MNCRL AB 3-0 PS2 18 (SUTURE) ×2 IMPLANT
SUT MNCRL AB 4-0 PS2 18 (SUTURE) ×2 IMPLANT
SUT MON AB 2-0 CT1 36 (SUTURE) ×4 IMPLANT
SUT STRATAFIX PDO 1 14 VIOLET (SUTURE) ×2
SUT STRATFX PDO 1 14 VIOLET (SUTURE) ×1
SUT VIC AB 2-0 CT1 27 (SUTURE) ×2
SUT VIC AB 2-0 CT1 TAPERPNT 27 (SUTURE) ×1 IMPLANT
SUTURE STRATFX PDO 1 14 VIOLET (SUTURE) ×1 IMPLANT
SYR 3ML LL SCALE MARK (SYRINGE) ×2 IMPLANT
TRAY FOLEY MTR SLVR 16FR STAT (SET/KITS/TRAYS/PACK) IMPLANT
TRI LOC BPS W/GRIP SZ 9 OFFS ×2 IMPLANT
TUBE SUCTION HIGH CAP CLEAR NV (SUCTIONS) ×2 IMPLANT
WATER STERILE IRR 1000ML POUR (IV SOLUTION) ×2 IMPLANT

## 2021-10-05 NOTE — Anesthesia Procedure Notes (Addendum)
Spinal  Patient location during procedure: OR Start time: 10/05/2021 10:42 AM End time: 10/05/2021 11:00 AM Staffing Performed: anesthesiologist  Anesthesiologist: Darral Dash, DO Preanesthetic Checklist Completed: patient identified, IV checked, site marked, risks and benefits discussed, surgical consent, monitors and equipment checked, pre-op evaluation and timeout performed Spinal Block Patient position: sitting Prep: DuraPrep Patient monitoring: heart rate, cardiac monitor, continuous pulse ox and blood pressure Approach: midline Location: L3-4 Injection technique: single-shot Needle Needle type: Pencan  Needle gauge: 24 G Needle length: 10 cm Assessment Events: other event and second provider Additional Notes - Unsuccessful attempts by CRNA and MDA team given extensive prior back surgery with scar tissue and hardware.   Patient identified. Risks/Benefits/Options discussed with patient including but not limited to bleeding, infection, nerve damage, paralysis, failed block, incomplete pain control, headache, blood pressure changes, nausea, vomiting, reactions to medications, itching and postpartum back pain. Confirmed with bedside nurse the patient's most recent platelet count. Confirmed with patient that they are not currently taking any anticoagulation, have any bleeding history or any family history of bleeding disorders. Patient expressed understanding and wished to proceed. All questions were answered. Sterile technique was used throughout the entire procedure. Please see nursing notes for vital signs. Warning signs of high block given to the patient including shortness of breath, tingling/numbness in hands, complete motor block, or any concerning symptoms with instructions to call for help. Patient was given instructions on fall risk and not to get out of bed. All questions and concerns addressed with instructions to call with any issues or inadequate analgesia.

## 2021-10-05 NOTE — Anesthesia Postprocedure Evaluation (Signed)
Anesthesia Post Note  Patient: Leslie [name]  Procedure(s) Performed: TOTAL HIP ARTHROPLASTY ANTERIOR APPROACH (Left: Hip)     Patient location during evaluation: PACU Level of consciousness: awake and alert Pain management: pain level controlled Vital Signs Assessment: post-procedure vital signs reviewed and stable Respiratory status: spontaneous breathing, nonlabored ventilation, respiratory function stable and patient connected to nasal cannula oxygen Cardiovascular status: blood pressure returned to baseline and stable Postop Assessment: no apparent nausea or vomiting Anesthetic complications: no Comments: Unable to place spinal, converted to GA   No notable events documented.  Last Vitals:  Vitals:   10/05/21 1400 10/05/21 1439  BP: (!) 145/89 (!) 158/86  Pulse: 80 77  Resp: 15 16  Temp:    SpO2: 98% 100%    Last Pain:  Vitals:   10/05/21 1400  TempSrc:   PainSc: 0-No pain                 Belenda Cruise P Milos Milligan

## 2021-10-05 NOTE — Anesthesia Procedure Notes (Addendum)
Procedure Name: Intubation Date/Time: 10/05/2021 11:02 AM Performed by: Darral Dash, DO Pre-anesthesia Checklist: Patient identified, Emergency Drugs available, Suction available and Patient being monitored Patient Re-evaluated:Patient Re-evaluated prior to induction Oxygen Delivery Method: Circle system utilized Preoxygenation: Pre-oxygenation with 100% oxygen Induction Type: IV induction Ventilation: Mask ventilation without difficulty Laryngoscope Size: Miller and 2 Tube type: Oral Tube size: 7.5 mm Number of attempts: 1 Airway Equipment and Method: Stylet and Oral airway Placement Confirmation: ETT inserted through vocal cords under direct vision, positive ETCO2 and breath sounds checked- equal and bilateral Secured at: 22 cm Tube secured with: Tape Dental Injury: Teeth and Oropharynx as per pre-operative assessment

## 2021-10-05 NOTE — Anesthesia Preprocedure Evaluation (Addendum)
Anesthesia Evaluation  Patient identified by MRN, date of birth, ID band Patient awake    Reviewed: Allergy & Precautions, NPO status , Patient's Chart, lab work & pertinent test results  Airway Mallampati: II  TM Distance: >3 FB Neck ROM: Full    Dental  (+) Teeth Intact   Pulmonary neg pulmonary ROS, former smoker,    Pulmonary exam normal        Cardiovascular negative cardio ROS   Rhythm:Regular Rate:Normal     Neuro/Psych  Headaches, CVA (2001, 2017) negative psych ROS   GI/Hepatic Neg liver ROS, PUD,   Endo/Other  Hypothyroidism   Renal/GU CRFRenal disease  negative genitourinary   Musculoskeletal  (+) Arthritis , Osteoarthritis,  Multiple back surgeries 1980s, 1990s with hardware   Abdominal (+)  Abdomen: soft.    Peds  Hematology negative hematology ROS (+)   Anesthesia Other Findings   Reproductive/Obstetrics                            Anesthesia Physical Anesthesia Plan  ASA: 3  Anesthesia Plan: MAC and Spinal   Post-op Pain Management:    Induction:   PONV Risk Score and Plan: 2 and Ondansetron, Dexamethasone and Treatment may vary due to age or medical condition  Airway Management Planned: Simple Face Mask, Natural Airway and Nasal Cannula  Additional Equipment: None  Intra-op Plan:   Post-operative Plan:   Informed Consent: I have reviewed the patients History and Physical, chart, labs and discussed the procedure including the risks, benefits and alternatives for the proposed anesthesia with the patient or authorized representative who has indicated his/her understanding and acceptance.     Dental advisory given  Plan Discussed with:   Anesthesia Plan Comments: (Lab Results      Component                Value               Date                      WBC                      9.6                 09/25/2021                HGB                      13.0                 09/25/2021                HCT                      41.2                09/25/2021                MCV                      90.5                09/25/2021                PLT  329                 09/25/2021           Lab Results      Component                Value               Date                      NA                       141                 09/25/2021                K                        3.8                 09/25/2021                CO2                      27                  09/25/2021                GLUCOSE                  104 (H)             09/25/2021                BUN                      18                  09/25/2021                CREATININE               0.90                09/25/2021                CALCIUM                  9.8                 09/25/2021                GFRNONAA                 >60                 09/25/2021          )        Anesthesia Quick Evaluation

## 2021-10-05 NOTE — Plan of Care (Signed)
Plan of care reviewed and discussed with the patient. 

## 2021-10-05 NOTE — Discharge Instructions (Addendum)
 Dr. Brian Swinteck Joint Replacement Specialist [facility] [address] [place], [place] [address] [phone]   TOTAL HIP REPLACEMENT POSTOPERATIVE DIRECTIONS    Hip Rehabilitation, Guidelines Following Surgery   WEIGHT BEARING Weight bearing as tolerated with assist device (walker, cane, etc) as directed, use it as long as suggested by your surgeon or therapist, typically at least 4-6 weeks.  The results of a hip operation are greatly improved after range of motion and muscle strengthening exercises. Follow all safety measures which are given to protect your hip. If any of these exercises cause increased pain or swelling in your joint, decrease the amount until you are comfortable again. Then slowly increase the exercises. Call your caregiver if you have problems or questions.   HOME CARE INSTRUCTIONS  Most of the following instructions are designed to prevent the dislocation of your new hip.  Remove items at home which could result in a fall. This includes throw rugs or furniture in walking pathways.  Continue medications as instructed at time of discharge. You may have some home medications which will be placed on hold until you complete the course of blood thinner medication. You may start showering once you are discharged home. Do not remove your dressing. Do not put on socks or shoes without following the instructions of your caregivers.   Sit on chairs with arms. Use the chair arms to help push yourself up when arising.  Arrange for the use of a toilet seat elevator so you are not sitting low.  Walk with walker as instructed.  You may resume a sexual relationship in one month or when given the OK by your caregiver.  Use walker as long as suggested by your caregivers.  You may put full weight on your legs and walk as much as is comfortable. Avoid periods of inactivity such as sitting longer than an hour when not asleep. This helps prevent blood  clots.  You may return to work once you are cleared by your surgeon.  Do not drive a car for 6 weeks or until released by your surgeon.  Do not drive while taking narcotics.  Wear elastic stockings for two weeks following surgery during the day but you may remove then at night.  Make sure you keep all of your appointments after your operation with all of your doctors and caregivers. You should call the office at the above phone number and make an appointment for approximately two weeks after the date of your surgery. Please pick up a stool softener and laxative for home use as long as you are requiring pain medications. ICE to the affected hip every three hours for 30 minutes at a time and then as needed for pain and swelling. Continue to use ice on the hip for pain and swelling from surgery. You may notice swelling that will progress down to the foot and ankle.  This is normal after surgery.  Elevate the leg when you are not up walking on it.   It is important for you to complete the blood thinner medication as prescribed by your doctor. Continue to use the breathing machine which will help keep your temperature down.  It is common for your temperature to cycle up and down following surgery, especially at night when you are not up moving around and exerting yourself.  The breathing machine keeps your lungs expanded and your temperature down.  RANGE OF MOTION AND STRENGTHENING EXERCISES  These exercises are designed to help you   keep full movement of your hip joint. Follow your caregiver's or physical therapist's instructions. Perform all exercises about fifteen times, three times per day or as directed. Exercise both hips, even if you have had only one joint replacement. These exercises can be done on a training (exercise) mat, on the floor, on a table or on a bed. Use whatever works the best and is most comfortable for you. Use music or television while you are exercising so that the exercises are a  pleasant break in your day. This will make your life better with the exercises acting as a break in routine you can look forward to.  ?Lying on your back, slowly slide your foot toward your buttocks, raising your knee up off the floor. Then slowly slide your foot back down until your leg is straight again.  ?Lying on your back spread your legs as far apart as you can without causing discomfort.  ?Lying on your side, raise your upper leg and foot straight up from the floor as far as is comfortable. Slowly lower the leg and repeat.  ?Lying on your back, tighten up the muscle in the front of your thigh (quadriceps muscles). You can do this by keeping your leg straight and trying to raise your heel off the floor. This helps strengthen the largest muscle supporting your knee.  ?Lying on your back, tighten up the muscles of your buttocks both with the legs straight and with the knee bent at a comfortable angle while keeping your heel on the floor.  ? ?SKILLED REHAB INSTRUCTIONS: ?If the patient is transferred to a skilled rehab facility following release from the hospital, a list of the current medications will be sent to the facility for the patient to continue.  When discharged from the skilled rehab facility, please have the facility set up the patient's Home Health Physical Therapy prior to being released. Also, the skilled facility will be responsible for providing the patient with their medications at time of release from the facility to include their pain medication and their blood thinner medication. If the patient is still at the rehab facility at time of the two week follow up appointment, the skilled rehab facility will also need to assist the patient in arranging follow up appointment in our office and any transportation needs. ? ?POST-OPERATIVE OPIOID TAPER INSTRUCTIONS: ?It is important to wean off of your opioid medication as soon as possible. If you do not need pain medication after your surgery it is ok  to stop day one. ?Opioids include: ?Codeine, Hydrocodone(Norco, Vicodin), Oxycodone(Percocet, oxycontin) and hydromorphone amongst others.  ?Long term and even short term use of opiods can cause: ?Increased pain response ?Dependence ?Constipation ?Depression ?Respiratory depression ?And more.  ?Withdrawal symptoms can include ?Flu like symptoms ?Nausea, vomiting ?And more ?Techniques to manage these symptoms ?Hydrate well ?Eat regular healthy meals ?Stay active ?Use relaxation techniques(deep breathing, meditating, yoga) ?Do Not substitute Alcohol to help with tapering ?If you have been on opioids for less than two weeks and do not have pain than it is ok to stop all together.  ?Plan to wean off of opioids ?This plan should start within one week post op of your joint replacement. ?Maintain the same interval or time between taking each dose and first decrease the dose.  ?Cut the total daily intake of opioids by one tablet each day ?Next start to increase the time between doses. ?The last dose that should be eliminated is the evening dose.  ? ? ?MAKE   SURE YOU:  Understand these instructions.  Will watch your condition.  Will get help right away if you are not doing well or get worse.  Pick up stool softner and laxative for home use following surgery while on pain medications. Do not remove your dressing. The dressing is waterproof--it is OK to take showers. Continue to use ice for pain and swelling after surgery. Do not use any lotions or creams on the incision until instructed by your surgeon. Total Hip Protocol.   Information on my medicine - ELIQUIS (apixaban)  Why was Eliquis prescribed for you? Eliquis was prescribed for you to reduce the risk of blood clots forming after orthopedic surgery.    What do You need to know about Eliquis? Take your Eliquis TWICE DAILY - one tablet in the morning and one tablet in the evening with or without food.  It would be best to take the dose about the  same time each day.  If you have difficulty swallowing the tablet whole please discuss with your pharmacist how to take the medication safely.  Take Eliquis exactly as prescribed by your doctor and DO NOT stop taking Eliquis without talking to the doctor who prescribed the medication.  Stopping without other medication to take the place of Eliquis may increase your risk of developing a clot.  After discharge, you should have regular check-up appointments with your healthcare provider that is prescribing your Eliquis.  What do you do if you miss a dose? If a dose of ELIQUIS is not taken at the scheduled time, take it as soon as possible on the same day and twice-daily administration should be resumed.  The dose should not be doubled to make up for a missed dose.  Do not take more than one tablet of ELIQUIS at the same time.  Important Safety Information A possible side effect of Eliquis is bleeding. You should call your healthcare provider right away if you experience any of the following: Bleeding from an injury or your nose that does not stop. Unusual colored urine (red or dark brown) or unusual colored stools (red or black). Unusual bruising for unknown reasons. A serious fall or if you hit your head (even if there is no bleeding).  Some medicines may interact with Eliquis and might increase your risk of bleeding or clotting while on Eliquis. To help avoid this, consult your healthcare provider or pharmacist prior to using any new prescription or non-prescription medications, including herbals, vitamins, non-steroidal anti-inflammatory drugs (NSAIDs) and supplements.  This website has more information on Eliquis (apixaban): http://www.eliquis.com/eliquis/home   

## 2021-10-05 NOTE — Interval H&P Note (Signed)
History and Physical Interval Note:  10/05/2021 9:32 AM  Leslie [name]  has presented today for surgery, with the diagnosis of Left hip osteoarthritis.  The various methods of treatment have been discussed with the patient and family. After consideration of risks, benefits and other options for treatment, the patient has consented to  Procedure(s): TOTAL HIP ARTHROPLASTY ANTERIOR APPROACH (Left) as a surgical intervention.  The patient's history has been reviewed, patient examined, no change in status, stable for surgery.  I have reviewed the patient's chart and labs.  Questions were answered to the patient's satisfaction.     Hilton Cork Shada Nienaber

## 2021-10-05 NOTE — Transfer of Care (Signed)
Immediate Anesthesia Transfer of Care Note  Patient: Patti Shorb  Procedure(s) Performed: TOTAL HIP ARTHROPLASTY ANTERIOR APPROACH (Left: Hip)  Patient Location: PACU  Anesthesia Type:General  Level of Consciousness: awake, alert  and oriented  Airway & Oxygen Therapy: Patient Spontanous Breathing and Patient connected to face mask  Post-op Assessment: Report given to RN and Post -op Vital signs reviewed and stable  Post vital signs: Reviewed and stable  Last Vitals:  Vitals Value Taken Time  BP 102/58 10/05/21 1245  Temp    Pulse 81 10/05/21 1249  Resp    SpO2 100 % 10/05/21 1249  Vitals shown include unvalidated device data.  Last Pain:  Vitals:   10/05/21 0800  TempSrc:   PainSc: 3       Patients Stated Pain Goal: 3 (15/83/09 4076)  Complications: No notable events documented.

## 2021-10-05 NOTE — Op Note (Signed)
OPERATIVE REPORT  SURGEON: Rod Can, MD   ASSISTANT: Cherlynn June, PA-C.  PREOPERATIVE DIAGNOSIS: Left hip arthritis.   POSTOPERATIVE DIAGNOSIS: Left hip arthritis.   PROCEDURE: Left total hip arthroplasty, anterior approach.   IMPLANTS: DePuy Tri Lock stem, size 9, hi offset. DePuy Pinnacle Cup, size 54 mm. DePuy Altrx liner, size 36 by 54 mm, +4 neutral. DePuy Biolox ceramic head ball, size 36 + 1.5 mm.  ANESTHESIA:  General  ESTIMATED BLOOD LOSS:-300 mL    ANTIBIOTICS: 2g Ancef.  DRAINS: None.  COMPLICATIONS: None.   CONDITION: PACU - hemodynamically stable.   BRIEF CLINICAL NOTE: Leslie [name] is a 81 y.o. female with a long-standing history of Left hip arthritis. After failing conservative management, the patient was indicated for total hip arthroplasty. The risks, benefits, and alternatives to the procedure were explained, and the patient elected to proceed.  PROCEDURE IN DETAIL: Surgical site was marked by myself in the pre-op holding area. Once inside the operating room, spinal anesthesia was attempted unsuccessfully. General anesthesia was obtained, and a foley catheter was inserted. The patient was then positioned on the Hana table.  All bony prominences were well padded.  The hip was prepped and draped in the normal sterile surgical fashion.  A time-out was called verifying side and site of surgery. The patient received IV antibiotics within 60 minutes of beginning the procedure.   Bikini incision was created three finger breadths distal to the ASIS, taking care to stay lateral to the medial border of the ASIS. The direct anterior approach to the hip was performed through the Hueter interval.  Lateral femoral circumflex vessels were treated with the Auqumantys. The anterior capsule was exposed and an inverted T capsulotomy was made. The femoral neck cut was made to the level of the templated cut.  A corkscrew was placed into the head and the head was removed.   The femoral head was found to have eburnated bone. The head was passed to the back table and was measured. Pubofemoral ligament was released off of the calcar, taking care to stay on bone. Superior capsule was released from the greater trochanter, taking care to stay lateral to the posterior border of the femoral neck in order to preserve the short external rotators.   Acetabular exposure was achieved, and the pulvinar and labrum were excised. Sequential reaming of the acetabulum was then performed up to a size 53 mm reamer under direct visulization. A 54 mm cup was then opened and impacted into place at approximately 40 degrees of abduction and 20 degrees of anteversion. The final polyethylene liner was impacted into place and acetabular osteophytes were removed.    I then gained femoral exposure taking care to protect the abductors and greater trochanter.  This was performed using standard external rotation, extension, and adduction.  A cookie cutter was used to enter the femoral canal, and then the femoral canal finder was placed.  Sequential broaching was performed up to a size 9.  Calcar planer was used on the femoral neck remnant.  I placed a hi offset neck and a trial head ball.  The hip was reduced.  Leg lengths and offset were checked fluoroscopically.  The hip was dislocated and trial components were removed.  The final implants were placed, and the hip was reduced.  Fluoroscopy was used to confirm component position and leg lengths.  At 90 degrees of external rotation and full extension, the hip was stable to an anterior directed force.   The wound  was copiously irrigated with Irrisept solution and normal saline using pule lavage.  Marcaine solution was injected into the periarticular soft tissue.  The wound was closed in layers using #1 Stratafix for the fascia, 2-0 Vicryl for the subcutaneous fat, 2-0 Monocryl for the deep dermal layer, 3-0 running Monocryl subcuticular stitch, and Dermabond for  the skin.  Once the glue was fully dried, an Aquacell Ag dressing was applied.  The patient was transported to the recovery room in stable condition.  Sponge, needle, and instrument counts were correct at the end of the case x2.  The patient tolerated the procedure well and there were no known complications.  Please note that a surgical assistant was a medical necessity for this procedure to perform it in a safe and expeditious manner. Assistant was necessary to provide appropriate retraction of vital neurovascular structures, to prevent femoral fracture, and to allow for anatomic placement of the prosthesis.

## 2021-10-05 NOTE — Evaluation (Signed)
Physical Therapy Evaluation Patient Details Name: Leslie [name] MRN: [id number] DOB: 1941-02-07 Today's Date: 10/05/2021  History of Present Illness  Patient is 80 y.o. female s/p Lt THA anterior approach on 10/05/21 with PMH significant for OA, CKD, hypothyroidism, TIAx2, back surgery T12-Sacrum fusion and U-rod in sacrum.    Clinical Impression  Leslie [name] is a 80 y.o. female POD 0 s/p Lt THA. Patient reports independence with mobility at baseline. Patient is now limited by functional impairments (see PT problem list below) and requires min assist for bed mobility and transfers with RW. Patient was able to take small steps bed>chair with RW and min assist. Patient instructed in exercise to facilitate circulation to manage edema and reduce DVT. Mobility limited today due to dizziness, pt has history of vertigo. Patient will benefit from continued skilled PT interventions to address impairments and progress towards PLOF. Acute PT will follow to progress mobility and stair training in preparation for safe discharge home.        Recommendations for follow up therapy are one component of a multi-disciplinary discharge planning process, led by the attending physician.  Recommendations may be updated based on patient status, additional functional criteria and insurance authorization.  Follow Up Recommendations Follow physician's recommendations for discharge plan and follow up therapies    Assistance Recommended at Discharge Frequent or constant Supervision/Assistance  Functional Status Assessment Patient has had a recent decline in their functional status and demonstrates the ability to make significant improvements in function in a reasonable and predictable amount of time.  Equipment Recommendations  Rolling walker (2 wheels)    Recommendations for Other Services       Precautions / Restrictions Precautions Precautions: Fall Restrictions Weight Bearing Restrictions:  No Other Position/Activity Restrictions: WBAT      Mobility  Bed Mobility Overal bed mobility: Needs Assistance Bed Mobility: Supine to Sit;Rolling Rolling: Min guard   Supine to sit: Min assist;HOB elevated     General bed mobility comments: cues to initiate log roll technique to maintain neutral spine as pt reports ongoing trouble with back. min assist to fully raise trunk up from EOB. pt limited by vertigo.    Transfers Overall transfer level: Needs assistance Equipment used: Rolling walker (2 wheels) Transfers: Sit to/from [facility] Sit to Stand: Min assist Stand pivot transfers: Min assist         General transfer comment: cues for hand placement with power up and assist to rise from EOB, min assist to manage walker and turn to step to recliner. pt limited by nausea to stand step transfer. BP 140/82 mmHg.    Ambulation/Gait                Stairs            Wheelchair Mobility    Modified Rankin (Stroke Patients Only)       Balance Overall balance assessment: Needs assistance Sitting-balance support: Feet supported Sitting balance-Leahy Scale: Good     Standing balance support: Reliant on assistive device for balance;Bilateral upper extremity supported;During functional activity Standing balance-Leahy Scale: Poor                               Pertinent Vitals/Pain Pain Assessment: 0-10    Home Living Family/patient expects to be discharged to:: Private residence Living Arrangements: Alone Available Help at Discharge: Family Type of Home: House Home Access: Stairs to enter Entrance Stairs-Rails:  (has a  rail) Technical brewer of Steps: 2   Home Layout: Two level;Full bath on main level;Able to live on main level with bedroom/bathroom        Prior Function Prior Level of Function : Independent/Modified Independent                     Hand Dominance        Extremity/Trunk Assessment    Upper Extremity Assessment Upper Extremity Assessment: Overall WFL for tasks assessed    Lower Extremity Assessment Lower Extremity Assessment: Generalized weakness    Cervical / Trunk Assessment Cervical / Trunk Assessment: Back Surgery  Communication   Communication: No difficulties  Cognition Arousal/Alertness: Awake/alert Behavior During Therapy: WFL for tasks assessed/performed Overall Cognitive Status: Within Functional Limits for tasks assessed                                          General Comments      Exercises Total Joint Exercises Ankle Circles/Pumps: AROM;Both;20 reps;Seated   Assessment/Plan    PT Assessment Patient needs continued PT services  PT Problem List Decreased strength;Decreased range of motion;Decreased activity tolerance;Decreased balance;Decreased mobility;Decreased knowledge of use of DME;Decreased knowledge of precautions       PT Treatment Interventions DME instruction;Gait training;Stair training;Functional mobility training;Therapeutic activities;Therapeutic exercise;Balance training;Patient/family education    PT Goals (Current goals can be found in the Care Plan section)  Acute Rehab PT Goals Patient Stated Goal: get recovered, stop being dizzy PT Goal Formulation: With patient Time For Goal Achievement: 10/12/21 Potential to Achieve Goals: Good    Frequency 7X/week   Barriers to discharge        Co-evaluation               AM-PAC PT "6 Clicks" Mobility  Outcome Measure Help needed turning from your back to your side while in a flat bed without using bedrails?: A Little Help needed moving from lying on your back to sitting on the side of a flat bed without using bedrails?: A Little Help needed moving to and from a bed to a chair (including a wheelchair)?: A Little Help needed standing up from a chair using your arms (e.g., wheelchair or bedside chair)?: A Little Help needed to walk in hospital room?: A  Little Help needed climbing 3-5 steps with a railing? : A Lot 6 Click Score: 17    End of Session Equipment Utilized During Treatment: Gait belt Activity Tolerance: Patient tolerated treatment well;Treatment limited secondary to medical complications (Comment) (nausea) Patient left: in chair;with call bell/phone within reach;with chair alarm set Nurse Communication: Mobility status PT Visit Diagnosis: Muscle weakness (generalized) (M62.81);Unsteadiness on feet (R26.81);Difficulty in walking, not elsewhere classified (R26.2)    Time: 9741-6384 PT Time Calculation (min) (ACUTE ONLY): 24 min   Charges:   PT Evaluation $PT Eval Low Complexity: 1 Low PT Treatments $Therapeutic Activity: 8-22 mins        Verner Mould, DPT Acute Rehabilitation Services Office [phone] Pager [phone]   Jacques Navy 10/05/2021, 4:26 PM

## 2021-10-06 ENCOUNTER — Encounter (HOSPITAL_COMMUNITY): Payer: Self-pay | Admitting: Orthopedic Surgery

## 2021-10-06 DIAGNOSIS — M1612 Unilateral primary osteoarthritis, left hip: Secondary | ICD-10-CM | POA: Diagnosis not present

## 2021-10-06 LAB — BASIC METABOLIC PANEL
Anion gap: 8 (ref 5–15)
BUN: 20 mg/dL (ref 8–23)
CO2: 26 mmol/L (ref 22–32)
Calcium: 9.2 mg/dL (ref 8.9–10.3)
Chloride: 107 mmol/L (ref 98–111)
Creatinine, Ser: 0.8 mg/dL (ref 0.44–1.00)
GFR, Estimated: >60 mL/min (ref >60)
Glucose, Bld: 126 mg/dL — ABNORMAL HIGH (ref 70–99)
Potassium: 4.7 mmol/L (ref 3.5–5.1)
Sodium: 141 mmol/L (ref 135–145)

## 2021-10-06 LAB — CBC
HCT: 33.9 % — ABNORMAL LOW (ref 36.0–46.0)
Hemoglobin: 10.7 g/dL — ABNORMAL LOW (ref 12.0–15.0)
MCH: 28.8 pg (ref 26.0–34.0)
MCHC: 31.6 g/dL (ref 30.0–36.0)
MCV: 91.1 fL (ref 80.0–100.0)
Platelets: 250 10*3/uL (ref 150–400)
RBC: 3.72 MIL/uL — ABNORMAL LOW (ref 3.87–5.11)
RDW: 14 % (ref 11.5–15.5)
WBC: 16.7 10*3/uL — ABNORMAL HIGH (ref 4.0–10.5)
nRBC: 0 % (ref 0.0–0.2)

## 2021-10-06 MED ORDER — ACETAMINOPHEN 325 MG PO TABS: 325.0000 mg | ORAL_TABLET | Freq: Four times a day (QID) | ORAL | Status: DC | PRN

## 2021-10-06 MED ORDER — ONDANSETRON HCL 4 MG PO TABS: 4.0000 mg | ORAL_TABLET | Freq: Four times a day (QID) | ORAL | 0 refills | Status: DC | PRN

## 2021-10-06 MED ORDER — APIXABAN 2.5 MG PO TABS: 2.5000 mg | ORAL_TABLET | Freq: Two times a day (BID) | ORAL | 0 refills | Status: DC

## 2021-10-06 MED ORDER — SENNA 8.6 MG PO TABS: 1.0000 | ORAL_TABLET | Freq: Two times a day (BID) | ORAL | 0 refills | Status: DC

## 2021-10-06 MED ORDER — DOCUSATE SODIUM 100 MG PO CAPS: 100.0000 mg | ORAL_CAPSULE | Freq: Two times a day (BID) | ORAL | 0 refills | Status: DC

## 2021-10-06 MED ORDER — HYDROCODONE-ACETAMINOPHEN 7.5-325 MG PO TABS: 1.0000 | ORAL_TABLET | ORAL | 0 refills | Status: DC | PRN

## 2021-10-06 NOTE — Progress Notes (Signed)
Physical Therapy Treatment Patient Details Name: Leslie [name] MRN: [id number] DOB: Jun 22, 1941 Today's Date: 10/06/2021   History of Present Illness Patient is 80 y.o. female s/p Lt THA anterior approach on 10/05/21 with PMH significant for OA, CKD, hypothyroidism, TIAx2, back surgery T12-Sacrum fusion and U-rod in sacrum.    PT Comments    Pt reports she's feeling better, less nausea/vertigo. She tolerated increased activity level, including ambulating 90', completing stair training, and demonstrates good understanding of HEP. She is ready to DC home from a PT standpoint.     Recommendations for follow up therapy are one component of a multi-disciplinary discharge planning process, led by the attending physician.  Recommendations may be updated based on patient status, additional functional criteria and insurance authorization.  Follow Up Recommendations  Follow physician's recommendations for discharge plan and follow up therapies     Assistance Recommended at Discharge Frequent or constant Supervision/Assistance  Equipment Recommendations  Rolling walker (2 wheels)    Recommendations for Other Services       Precautions / Restrictions Precautions Precautions: Fall Restrictions Weight Bearing Restrictions: No Other Position/Activity Restrictions: WBAT     Mobility  Bed Mobility   Bed Mobility: Sit to Supine       Sit to supine: Supervision        Transfers Overall transfer level: Needs assistance Equipment used: Rolling walker (2 wheels) Transfers: Sit to/from Stand Sit to Stand: Supervision           General transfer comment: cues for hand placement with power up    Ambulation/Gait Ambulation/Gait assistance: Supervision Gait Distance (Feet): 90 Feet Assistive device: Rolling walker (2 wheels) Gait Pattern/deviations: Step-to pattern;Decreased step length - right;Decreased step length - left Gait velocity: decr   General Gait Details: VCs  sequencing, steady with no loss of balance   Stairs Stairs: Yes Stairs assistance: Min guard Stair Management: Two rails;Step to pattern;Forwards Number of Stairs: 2 General stair comments: VCs sequencing.   Wheelchair Mobility    Modified Rankin (Stroke Patients Only)       Balance Overall balance assessment: Needs assistance Sitting-balance support: Feet supported Sitting balance-Leahy Scale: Good     Standing balance support: Bilateral upper extremity supported;During functional activity Standing balance-Leahy Scale: Fair                              Cognition Arousal/Alertness: Awake/alert Behavior During Therapy: WFL for tasks assessed/performed Overall Cognitive Status: Within Functional Limits for tasks assessed                                          Exercises Total Joint Exercises Ankle Circles/Pumps: AROM;Both;15 reps;Supine Quad Sets: AROM;Both;5 reps;Supine Short Arc Quad: AROM;Left;5 reps;Supine Heel Slides: AAROM;Left;Supine;15 reps Hip ABduction/ADduction: AAROM;Left;Supine;10 reps Long Arc Quad: AROM;Left;Seated;5 reps    General Comments        Pertinent Vitals/Pain Pain Score: 6  Pain Location: L hip Pain Descriptors / Indicators: Operative site guarding;Sore Pain Intervention(s): Limited activity within patient's tolerance;Monitored during session;Ice applied;Repositioned    Home Living                          Prior Function            PT Goals (current goals can now be found in the care plan section) Acute  Rehab PT Goals Patient Stated Goal: get recovered, stop being dizzy PT Goal Formulation: With patient Time For Goal Achievement: 10/12/21 Potential to Achieve Goals: Good Progress towards PT goals: Progressing toward goals    Frequency    7X/week      PT Plan Current plan remains appropriate    Co-evaluation              AM-PAC PT "6 Clicks" Mobility   Outcome  Measure  Help needed turning from your back to your side while in a flat bed without using bedrails?: A Little Help needed moving from lying on your back to sitting on the side of a flat bed without using bedrails?: A Little Help needed moving to and from a bed to a chair (including a wheelchair)?: None Help needed standing up from a chair using your arms (e.g., wheelchair or bedside chair)?: A Little Help needed to walk in hospital room?: A Little Help needed climbing 3-5 steps with a railing? : A Little 6 Click Score: 19    End of Session Equipment Utilized During Treatment: Gait belt Activity Tolerance: Patient tolerated treatment well (vertigo) Patient left: with call bell/phone within reach;in bed;with bed alarm set Nurse Communication: Mobility status PT Visit Diagnosis: Muscle weakness (generalized) (M62.81);Unsteadiness on feet (R26.81);Difficulty in walking, not elsewhere classified (R26.2)     Time: 8546-2703 PT Time Calculation (min) (ACUTE ONLY): 34 min  Charges:  $Gait Training: 8-22 mins $Therapeutic Exercise: 8-22 mins                     Blondell Reveal Kistler PT 10/06/2021  Acute Rehabilitation Services Pager [phone] Office [phone]

## 2021-10-06 NOTE — Progress Notes (Signed)
    Subjective:  Patient reports pain as mild to moderate.  Denies N/V/CP/SOB.   Objective:   VITALS:   Vitals:   10/05/21 1808 10/05/21 2104 10/06/21 0128 10/06/21 0543  BP: 132/87 (!) 155/90 131/74 (!) 94/54  Pulse: 67 73 85 94  Resp: 16 16 15 16   Temp: 97.7 F (36.5 C) 98 F (36.7 C) 98.6 F (37 C) 98.9 F (37.2 C)  TempSrc:  Oral Oral Oral  SpO2: 100% 99% 100% 96%  Weight:      Height:        NAD ABD soft Neurovascular intact Sensation intact distally Intact pulses distally Dorsiflexion/Plantar flexion intact Incision: dressing C/D/I   Lab Results  Component Value Date   WBC 16.7 (H) 10/06/2021   HGB 10.7 (L) 10/06/2021   HCT 33.9 (L) 10/06/2021   MCV 91.1 10/06/2021   PLT 250 10/06/2021   BMET    Component Value Date/Time   NA 141 10/06/2021 0307   K 4.7 10/06/2021 0307   CL 107 10/06/2021 0307   CO2 26 10/06/2021 0307   GLUCOSE 126 (H) 10/06/2021 0307   BUN 20 10/06/2021 0307   CREATININE 0.80 10/06/2021 0307   CALCIUM 9.2 10/06/2021 0307   GFRNONAA >60 10/06/2021 8676     Assessment/Plan: 1 Day Post-Op   Principal Problem:   Osteoarthritis of left hip   WBAT with walker DVT ppx:  Apixaban , SCDs, TEDS PO pain control PT/OT Dispo: D/C home once cleared by therapy     Leslie [name] 10/06/2021, 7:48 AM  [facility] is now [address], [place], [place] [address] Phone: [phone] [email or web address] Facebook  [employer]

## 2021-10-06 NOTE — Progress Notes (Signed)
Physical Therapy Treatment Patient Details Name: Leslie [name] MRN: [id number] DOB: Nov 25, 1941 Today's Date: 10/06/2021   History of Present Illness Patient is 80 y.o. female s/p Lt THA anterior approach on 10/05/21 with PMH significant for OA, CKD, hypothyroidism, TIAx2, back surgery T12-Sacrum fusion and U-rod in sacrum.    PT Comments    Min A for log roll to R, Min A to raise trunk. Pt reported vertigo in sitting and standing. She took a few pivotal steps to recliner with RW, tolerance limited by pain and vertigo. Instructed pt in HEP. Will plan to see her again this afternoon to attempt a longer walk.     Recommendations for follow up therapy are one component of a multi-disciplinary discharge planning process, led by the attending physician.  Recommendations may be updated based on patient status, additional functional criteria and insurance authorization.  Follow Up Recommendations  Follow physician's recommendations for discharge plan and follow up therapies     Assistance Recommended at Discharge Frequent or constant Supervision/Assistance  Equipment Recommendations  Rolling walker (2 wheels)    Recommendations for Other Services       Precautions / Restrictions Precautions Precautions: Fall Restrictions Weight Bearing Restrictions: No Other Position/Activity Restrictions: WBAT     Mobility  Bed Mobility Overal bed mobility: Needs Assistance Bed Mobility: Supine to Sit;Rolling;Sidelying to Sit Rolling: Min assist Sidelying to sit: Min assist       General bed mobility comments: cues to initiate log roll technique to maintain neutral spine as pt reports ongoing trouble with back. min assist to fully raise trunk up from EOB. pt limited by vertigo. H/o extensive back surgery.    Transfers Overall transfer level: Needs assistance Equipment used: Rolling walker (2 wheels) Transfers: Sit to/from [facility] Sit to Stand: Min assist;From  elevated surface Stand pivot transfers: Min assist         General transfer comment: cues for hand placement with power up and assist to rise from EOB, min assist to manage walker and turn to step to recliner. pt limited by vertigo and pain in L hip    Ambulation/Gait Ambulation/Gait assistance: Min guard Gait Distance (Feet): 3 Feet Assistive device: Rolling walker (2 wheels) Gait Pattern/deviations: Step-to pattern;Decreased step length - right;Decreased step length - left Gait velocity: decr   General Gait Details: VCs sequencing, pt took a few pivotal steps to recliner, distance limited by pain and vertigo/nausea   Stairs             Wheelchair Mobility    Modified Rankin (Stroke Patients Only)       Balance Overall balance assessment: Needs assistance Sitting-balance support: Feet supported Sitting balance-Leahy Scale: Good     Standing balance support: Reliant on assistive device for balance;Bilateral upper extremity supported;During functional activity Standing balance-Leahy Scale: Poor                              Cognition Arousal/Alertness: Awake/alert Behavior During Therapy: WFL for tasks assessed/performed Overall Cognitive Status: Within Functional Limits for tasks assessed                                          Exercises Total Joint Exercises Ankle Circles/Pumps: AROM;Both;15 reps;Supine Quad Sets: AROM;Both;5 reps;Supine Short Arc Quad: AROM;Left;5 reps;Supine Heel Slides: AAROM;Left;5 reps;Supine Hip ABduction/ADduction: AAROM;Left;5 reps;Supine  General Comments        Pertinent Vitals/Pain Pain Assessment: 0-10 Pain Score: 8  Pain Location: L hip Pain Descriptors / Indicators: Operative site guarding;Sore Pain Intervention(s): Limited activity within patient's tolerance;Monitored during session;Premedicated before session;Ice applied    Home Living                          Prior  Function            PT Goals (current goals can now be found in the care plan section) Acute Rehab PT Goals Patient Stated Goal: get recovered, stop being dizzy PT Goal Formulation: With patient Time For Goal Achievement: 10/12/21 Potential to Achieve Goals: Good Progress towards PT goals: Progressing toward goals    Frequency    7X/week      PT Plan Current plan remains appropriate    Co-evaluation              AM-PAC PT "6 Clicks" Mobility   Outcome Measure  Help needed turning from your back to your side while in a flat bed without using bedrails?: A Little Help needed moving from lying on your back to sitting on the side of a flat bed without using bedrails?: A Little Help needed moving to and from a bed to a chair (including a wheelchair)?: A Little Help needed standing up from a chair using your arms (e.g., wheelchair or bedside chair)?: A Little Help needed to walk in hospital room?: A Lot Help needed climbing 3-5 steps with a railing? : A Lot 6 Click Score: 16    End of Session Equipment Utilized During Treatment: Gait belt Activity Tolerance: Treatment limited secondary to medical complications (Comment);Patient limited by pain (vertigo) Patient left: in chair;with call bell/phone within reach;with chair alarm set Nurse Communication: Mobility status PT Visit Diagnosis: Muscle weakness (generalized) (M62.81);Unsteadiness on feet (R26.81);Difficulty in walking, not elsewhere classified (R26.2)     Time: 8421-0312 PT Time Calculation (min) (ACUTE ONLY): 37 min  Charges:  $Gait Training: 8-22 mins $Therapeutic Exercise: 8-22 mins                    Blondell Reveal Kistler PT 10/06/2021  Acute Rehabilitation Services Pager [phone] Office [phone]

## 2021-10-06 NOTE — Discharge Summary (Signed)
Physician Discharge Summary  Patient ID: Leslie [name] MRN: [id number] DOB/AGE: 05/29/1941 80 y.o.  Admit date: 10/05/2021 Discharge date: 10/06/2021  Admission Diagnoses:  Osteoarthritis of left hip  Discharge Diagnoses:  Principal Problem:   Osteoarthritis of left hip   Past Medical History:  Diagnosis Date   Arthritis    Chronic kidney disease    "stage 3 kidney failure" improving per pt   Headache    Hypothyroidism    Insomnia    Plantar fasciitis    Stomach ulcer ~2000   Stroke [facility])    TIA X2   2001, jan 2017   Vitamin D deficiency     Surgeries: Procedure(s): TOTAL HIP ARTHROPLASTY ANTERIOR APPROACH on 10/05/2021   Consultants (if any):   Discharged Condition: Improved  Hospital Course: Leslie [name] is an 80 y.o. female who was admitted 10/05/2021 with a diagnosis of Osteoarthritis of left hip and went to the operating room on 10/05/2021 and underwent the above named procedures.    She was given perioperative antibiotics:  Anti-infectives (From admission, onward)    Start     Dose/Rate Route Frequency Ordered Stop   10/05/21 1600  ceFAZolin (ANCEF) IVPB 2g/100 mL premix        2 g 200 mL/hr over 30 Minutes Intravenous Every 6 hours 10/05/21 1434 10/05/21 2245   10/05/21 0800  ceFAZolin (ANCEF) IVPB 2g/100 mL premix        2 g 200 mL/hr over 30 Minutes Intravenous On call to O.R. 10/05/21 3151 10/05/21 1035     .  She was given sequential compression devices, early ambulation, and apixaban for DVT prophylaxis.  She benefited maximally from the hospital stay and there were no complications.    Recent vital signs:  Vitals:   10/06/21 0128 10/06/21 0543  BP: 131/74 (!) 94/54  Pulse: 85 94  Resp: 15 16  Temp: 98.6 F (37 C) 98.9 F (37.2 C)  SpO2: 100% 96%    Recent laboratory studies:  Lab Results  Component Value Date   HGB 10.7 (L) 10/06/2021   HGB 13.0 09/25/2021   HGB 13.5 02/07/2011   Lab Results  Component Value Date    WBC 16.7 (H) 10/06/2021   PLT 250 10/06/2021   Lab Results  Component Value Date   INR 1.0 09/25/2021   Lab Results  Component Value Date   NA 141 10/06/2021   K 4.7 10/06/2021   CL 107 10/06/2021   CO2 26 10/06/2021   BUN 20 10/06/2021   CREATININE 0.80 10/06/2021   GLUCOSE 126 (H) 10/06/2021     WEIGHT BEARING   Weight bearing as tolerated with assist device (walker, cane, etc) as directed, use it as long as suggested by your surgeon or therapist, typically at least 4-6 weeks.   EXERCISES  Results after joint replacement surgery are often greatly improved when you follow the exercise, range of motion and muscle strengthening exercises prescribed by your doctor. Safety measures are also important to protect the joint from further injury. Any time any of these exercises cause you to have increased pain or swelling, decrease what you are doing until you are comfortable again and then slowly increase them. If you have problems or questions, call your caregiver or physical therapist for advice.   Rehabilitation is important following a joint replacement. After just a few days of immobilization, the muscles of the leg can become weakened and shrink (atrophy).  These exercises are designed to build up the tone and  strength of the thigh and leg muscles and to improve motion. Often times heat used for twenty to thirty minutes before working out will loosen up your tissues and help with improving the range of motion but do not use heat for the first two weeks following surgery (sometimes heat can increase post-operative swelling).   These exercises can be done on a training (exercise) mat, on the floor, on a table or on a bed. Use whatever works the best and is most comfortable for you.    Use music or television while you are exercising so that the exercises are a pleasant break in your day. This will make your life better with the exercises acting as a break in your routine that you can  look forward to.   Perform all exercises about fifteen times, three times per day or as directed.  You should exercise both the operative leg and the other leg as well.  Exercises include:   Quad Sets - Tighten up the muscle on the front of the thigh (Quad) and hold for 5-10 seconds.   Straight Leg Raises - With your knee straight (if you were given a brace, keep it on), lift the leg to 60 degrees, hold for 3 seconds, and slowly lower the leg.  Perform this exercise against resistance later as your leg gets stronger.  Leg Slides: Lying on your back, slowly slide your foot toward your buttocks, bending your knee up off the floor (only go as far as is comfortable). Then slowly slide your foot back down until your leg is flat on the floor again.  Angel Wings: Lying on your back spread your legs to the side as far apart as you can without causing discomfort.  Hamstring Strength:  Lying on your back, push your heel against the floor with your leg straight by tightening up the muscles of your buttocks.  Repeat, but this time bend your knee to a comfortable angle, and push your heel against the floor.  You may put a pillow under the heel to make it more comfortable if necessary.   A rehabilitation program following joint replacement surgery can speed recovery and prevent re-injury in the future due to weakened muscles. Contact your doctor or a physical therapist for more information on knee rehabilitation.    CONSTIPATION  Constipation is defined medically as fewer than three stools per week and severe constipation as less than one stool per week.  Even if you have a regular bowel pattern at home, your normal regimen is likely to be disrupted due to multiple reasons following surgery.  Combination of anesthesia, postoperative narcotics, change in appetite and fluid intake all can affect your bowels.   YOU MUST use at least one of the following options; they are listed in order of increasing strength to get  the job done.  They are all available over the counter, and you may need to use some, POSSIBLY even all of these options:    Drink plenty of fluids (prune juice may be helpful) and high fiber foods Colace 100 mg by mouth twice a day  Senokot for constipation as directed and as needed Dulcolax (bisacodyl), take with full glass of water  Miralax (polyethylene glycol) once or twice a day as needed.  If you have tried all these things and are unable to have a bowel movement in the first 3-4 days after surgery call either your surgeon or your primary doctor.    If you experience loose stools or  diarrhea, hold the medications until you stool forms back up.  If your symptoms do not get better within 1 week or if they get worse, check with your doctor.  If you experience "the worst abdominal pain ever" or develop nausea or vomiting, please contact the office immediately for further recommendations for treatment.  Dental Antibiotics:  In most cases prophylactic antibiotics for Dental procdeures after total joint surgery are not necessary.  Exceptions are as follows:  1. History of prior total joint infection  2. Severely immunocompromised (Organ Transplant, cancer chemotherapy, Rheumatoid biologic meds such as [place])  3. Poorly controlled diabetes (A1C &gt; 8.0, blood glucose over 200)  If you have one of these conditions, contact your surgeon for an antibiotic prescription, prior to your dental procedure.   POST-OPERATIVE OPIOID TAPER INSTRUCTIONS: It is important to wean off of your opioid medication as soon as possible. If you do not need pain medication after your surgery it is ok to stop day one. Opioids include: Codeine, Hydrocodone(Norco, Vicodin), Oxycodone(Percocet, oxycontin) and hydromorphone amongst others.  Long term and even short term use of opiods can cause: Increased pain response Dependence Constipation Depression Respiratory depression And more.  Withdrawal symptoms  can include Flu like symptoms Nausea, vomiting And more Techniques to manage these symptoms Hydrate well Eat regular healthy meals Stay active Use relaxation techniques(deep breathing, meditating, yoga) Do Not substitute Alcohol to help with tapering If you have been on opioids for less than two weeks and do not have pain than it is ok to stop all together.  Plan to wean off of opioids This plan should start within one week post op of your joint replacement. Maintain the same interval or time between taking each dose and first decrease the dose.  Cut the total daily intake of opioids by one tablet each day Next start to increase the time between doses. The last dose that should be eliminated is the evening dose.   ITCHING:  If you experience itching with your medications, try taking only a single pain pill, or even half a pain pill at a time.  You can also use Benadryl over the counter for itching or also to help with sleep.   TED HOSE STOCKINGS:  Use stockings on both legs until for at least 2 weeks or as directed by physician office. They may be removed at night for sleeping.  MEDICATIONS:  See your medication summary on the "After Visit Summary" that nursing will review with you.  You may have some home medications which will be placed on hold until you complete the course of blood thinner medication.  It is important for you to complete the blood thinner medication as prescribed.  PRECAUTIONS:  If you experience chest pain or shortness of breath - call 911 immediately for transfer to the hospital emergency department.   If you develop a fever greater that 101 F, purulent drainage from wound, increased redness or drainage from wound, foul odor from the wound/dressing, or calf pain - CONTACT YOUR SURGEON.                                                   FOLLOW-UP APPOINTMENTS:  If you do not already have a post-op appointment, please call the office for an appointment to be seen by your  surgeon.  Guidelines for how soon to  be seen are listed in your "After Visit Summary", but are typically between 1-4 weeks after surgery.  OTHER INSTRUCTIONS:   Knee Replacement:  Do not place pillow under knee, focus on keeping the knee straight while resting. CPM instructions: 0-90 degrees, 2 hours in the morning, 2 hours in the afternoon, and 2 hours in the evening. Place foam block, curve side up under heel at all times except when in CPM or when walking.  DO NOT modify, tear, cut, or change the foam block in any way.   MAKE SURE YOU:  Understand these instructions.  Get help right away if you are not doing well or get worse.    Thank you for letting us be a part of your medical care team.  It is a privilege we respect greatly.  We hope these instructions will help you stay on track for a fast and full recovery!   Diagnostic Studies: DG Pelvis Portable  Result Date: 10/05/2021 CLINICAL DATA:  Left total hip replacement EXAM: PORTABLE PELVIS 1-2 VIEWS COMPARISON:  None. FINDINGS: There is a left hip arthroplasty in normal alignment evidence of loosening or periprosthetic fracture. Expected soft tissue changes. Unchanged postoperative change of the lower lumbar spine, partially visualized. Mild right hip degenerative change. Mild bilateral SI joint and pubic symphysis degenerative change. IMPRESSION: Left hip arthroplasty without evidence of immediate hardware complication. Electronically Signed   By: Maurine Simmering [name]   On: 10/05/2021 14:24   DG C-Arm 1-60 Min-No Report  Result Date: 10/05/2021 Fluoroscopy was utilized by the requesting physician.  No radiographic interpretation.   DG HIP OPERATIVE UNILAT W OR W/O PELVIS LEFT  Result Date: 10/05/2021 CLINICAL DATA:  Anterior approach left total hip arthroplasty EXAM: OPERATIVE LEFT HIP WITH PELVIS COMPARISON:  04/17/2021 left hip radiographs FLUOROSCOPY TIME:  Fluoroscopy Time:  0 minutes 12 seconds Number of Acquired Spot Images: 1  FINDINGS: Nondiagnostic spot fluoroscopic intraoperative radiograph demonstrates partially visualized postsurgical changes from left total hip arthroplasty with no evidence of hip dislocation on this frontal view. IMPRESSION: Intraoperative fluoroscopic guidance for left total hip arthroplasty. Electronically Signed   By: Ilona Sorrel [name]   On: 10/05/2021 14:17    Disposition: Discharge disposition: 01-Home or Self Care       Discharge Instructions     Call MD / Call 911   Complete by: As directed    If you experience chest pain or shortness of breath, CALL 911 and be transported to the hospital emergency room.  If you develope a fever above 101 F, pus (white drainage) or increased drainage or redness at the wound, or calf pain, call your surgeon's office.   Constipation Prevention   Complete by: As directed    Drink plenty of fluids.  Prune juice may be helpful.  You may use a stool softener, such as Colace (over the counter) 100 mg twice a day.  Use MiraLax (over the counter) for constipation as needed.   Diet - low sodium heart healthy   Complete by: As directed    Driving restrictions   Complete by: As directed    No driving for 6 weeks   Increase activity slowly as tolerated   Complete by: As directed    Lifting restrictions   Complete by: As directed    No lifting for 6 weeks   Post-operative opioid taper instructions:   Complete by: As directed    POST-OPERATIVE OPIOID TAPER INSTRUCTIONS: It is important to wean off of your opioid medication  as soon as possible. If you do not need pain medication after your surgery it is ok to stop day one. Opioids include: Codeine, Hydrocodone(Norco, Vicodin), Oxycodone(Percocet, oxycontin) and hydromorphone amongst others.  Long term and even short term use of opiods can cause: Increased pain response Dependence Constipation Depression Respiratory depression And more.  Withdrawal symptoms can include Flu like symptoms Nausea,  vomiting And more Techniques to manage these symptoms Hydrate well Eat regular healthy meals Stay active Use relaxation techniques(deep breathing, meditating, yoga) Do Not substitute Alcohol to help with tapering If you have been on opioids for less than two weeks and do not have pain than it is ok to stop all together.  Plan to wean off of opioids This plan should start within one week post op of your joint replacement. Maintain the same interval or time between taking each dose and first decrease the dose.  Cut the total daily intake of opioids by one tablet each day Next start to increase the time between doses. The last dose that should be eliminated is the evening dose.      TED hose   Complete by: As directed    Use stockings (TED hose) for 2 weeks on both leg(s).  You may remove them at night for sleeping.        Follow-up Information     Swinteck, Aaron Edelman, MD. Schedule an appointment as soon as possible for a visit in 2 week(s).   Specialty: Orthopedic Surgery Why: For wound re-check Contact information: [address] [place] [address] [phone]                  Signed: Dorothyann Peng 10/06/2021, 9:36 AM

## 2021-10-06 NOTE — TOC Transition Note (Signed)
Transition of Care [facility]) - CM/SW Discharge Note   Patient Details  Name: Leslie [name] MRN: [id number] Date of Birth: 24-Feb-1941  [facility] CM/SW Contact:  Lennart Pall, LCSW Phone Number: 10/06/2021, 1:29 PM   Clinical Narrative:    Met with pt and confirming she has received rw to room.  Plan for HEP.  No further TOC needs.   Final next level of care: Home/Self Care Barriers to Discharge: No Barriers Identified   Patient Goals and CMS Choice Patient states their goals for this hospitalization and ongoing recovery are:: return home      Discharge Placement                       Discharge Plan and Services                DME Arranged: Walker rolling DME Agency: [employer]     Representative spoke with at DME Agency: [facility] (SDOH) Interventions     Readmission Risk Interventions No flowsheet data found.

## 2021-10-12 ENCOUNTER — Other Ambulatory Visit: Payer: Self-pay

## 2021-10-12 ENCOUNTER — Ambulatory Visit (HOSPITAL_COMMUNITY)
Admission: RE | Admit: 2021-10-12 | Discharge: 2021-10-12 | Disposition: A | Discharge status: Home / Self Care | Payer: Medicare HMO | Source: Ambulatory Visit | Attending: Cardiovascular Disease | Admitting: Cardiovascular Disease

## 2021-10-12 ENCOUNTER — Other Ambulatory Visit (HOSPITAL_COMMUNITY): Payer: Self-pay | Admitting: Physician Assistant

## 2021-10-12 DIAGNOSIS — M79605 Pain in left leg: Secondary | ICD-10-CM | POA: Diagnosis not present

## 2022-03-05 ENCOUNTER — Other Ambulatory Visit: Payer: Self-pay

## 2022-03-05 ENCOUNTER — Other Ambulatory Visit: Payer: Self-pay | Admitting: Family

## 2022-03-05 ENCOUNTER — Ambulatory Visit
Admission: RE | Admit: 2022-03-05 | Discharge: 2022-03-05 | Disposition: A | Discharge status: Home / Self Care | Payer: Medicare HMO | Source: Ambulatory Visit | Attending: Family | Admitting: Family

## 2022-03-05 DIAGNOSIS — M25552 Pain in left hip: Secondary | ICD-10-CM

## 2022-03-05 DIAGNOSIS — R2242 Localized swelling, mass and lump, left lower limb: Secondary | ICD-10-CM

## 2022-10-05 ENCOUNTER — Ambulatory Visit
Admission: RE | Admit: 2022-10-05 | Discharge: 2022-10-05 | Disposition: A | Discharge status: Home / Self Care | Payer: Medicare HMO | Source: Ambulatory Visit | Attending: Family | Admitting: Family

## 2022-10-05 ENCOUNTER — Other Ambulatory Visit: Payer: Self-pay | Admitting: Family

## 2022-10-05 DIAGNOSIS — J219 Acute bronchiolitis, unspecified: Secondary | ICD-10-CM

## 2022-10-23 ENCOUNTER — Ambulatory Visit
Admission: RE | Admit: 2022-10-23 | Discharge: 2022-10-23 | Disposition: A | Discharge status: Home / Self Care | Payer: Medicare HMO | Source: Ambulatory Visit | Attending: Family | Admitting: Family

## 2022-10-23 ENCOUNTER — Other Ambulatory Visit: Payer: Self-pay | Admitting: Family

## 2022-10-23 DIAGNOSIS — Z8701 Personal history of pneumonia (recurrent): Secondary | ICD-10-CM

## 2022-10-29 ENCOUNTER — Encounter (HOSPITAL_COMMUNITY): Payer: Self-pay

## 2022-10-29 ENCOUNTER — Other Ambulatory Visit: Payer: Self-pay

## 2022-10-29 ENCOUNTER — Emergency Department (HOSPITAL_BASED_OUTPATIENT_CLINIC_OR_DEPARTMENT_OTHER): Payer: Medicare HMO

## 2022-10-29 ENCOUNTER — Inpatient Hospital Stay (HOSPITAL_BASED_OUTPATIENT_CLINIC_OR_DEPARTMENT_OTHER)
Admission: EM | Admit: 2022-10-29 | Discharge: 2022-11-08 | DRG: 252 | Disposition: A | Discharge status: Rehabilitation Facility | Payer: Medicare HMO | Attending: Internal Medicine | Admitting: Internal Medicine

## 2022-10-29 ENCOUNTER — Encounter (HOSPITAL_BASED_OUTPATIENT_CLINIC_OR_DEPARTMENT_OTHER): Payer: Self-pay

## 2022-10-29 DIAGNOSIS — N179 Acute kidney failure, unspecified: Secondary | ICD-10-CM | POA: Diagnosis not present

## 2022-10-29 DIAGNOSIS — Z886 Allergy status to analgesic agent status: Secondary | ICD-10-CM

## 2022-10-29 DIAGNOSIS — I340 Nonrheumatic mitral (valve) insufficiency: Secondary | ICD-10-CM | POA: Diagnosis present

## 2022-10-29 DIAGNOSIS — J439 Emphysema, unspecified: Secondary | ICD-10-CM | POA: Diagnosis present

## 2022-10-29 DIAGNOSIS — R29719 NIHSS score 19: Secondary | ICD-10-CM | POA: Diagnosis not present

## 2022-10-29 DIAGNOSIS — K219 Gastro-esophageal reflux disease without esophagitis: Secondary | ICD-10-CM | POA: Diagnosis present

## 2022-10-29 DIAGNOSIS — J440 Chronic obstructive pulmonary disease with acute lower respiratory infection: Secondary | ICD-10-CM | POA: Diagnosis present

## 2022-10-29 DIAGNOSIS — D6869 Other thrombophilia: Secondary | ICD-10-CM | POA: Diagnosis not present

## 2022-10-29 DIAGNOSIS — Z79899 Other long term (current) drug therapy: Secondary | ICD-10-CM

## 2022-10-29 DIAGNOSIS — J449 Chronic obstructive pulmonary disease, unspecified: Secondary | ICD-10-CM | POA: Diagnosis present

## 2022-10-29 DIAGNOSIS — G8194 Hemiplegia, unspecified affecting left nondominant side: Secondary | ICD-10-CM | POA: Diagnosis not present

## 2022-10-29 DIAGNOSIS — I6601 Occlusion and stenosis of right middle cerebral artery: Secondary | ICD-10-CM | POA: Diagnosis present

## 2022-10-29 DIAGNOSIS — Z96642 Presence of left artificial hip joint: Secondary | ICD-10-CM | POA: Diagnosis present

## 2022-10-29 DIAGNOSIS — I4819 Other persistent atrial fibrillation: Secondary | ICD-10-CM | POA: Diagnosis not present

## 2022-10-29 DIAGNOSIS — Z8261 Family history of arthritis: Secondary | ICD-10-CM

## 2022-10-29 DIAGNOSIS — I5023 Acute on chronic systolic (congestive) heart failure: Secondary | ICD-10-CM | POA: Diagnosis not present

## 2022-10-29 DIAGNOSIS — Z8673 Personal history of transient ischemic attack (TIA), and cerebral infarction without residual deficits: Secondary | ICD-10-CM

## 2022-10-29 DIAGNOSIS — E1122 Type 2 diabetes mellitus with diabetic chronic kidney disease: Secondary | ICD-10-CM | POA: Diagnosis present

## 2022-10-29 DIAGNOSIS — J90 Pleural effusion, not elsewhere classified: Secondary | ICD-10-CM

## 2022-10-29 DIAGNOSIS — R471 Dysarthria and anarthria: Secondary | ICD-10-CM | POA: Diagnosis present

## 2022-10-29 DIAGNOSIS — Z888 Allergy status to other drugs, medicaments and biological substances status: Secondary | ICD-10-CM

## 2022-10-29 DIAGNOSIS — I700 Atherosclerosis of aorta: Secondary | ICD-10-CM | POA: Diagnosis present

## 2022-10-29 DIAGNOSIS — E785 Hyperlipidemia, unspecified: Secondary | ICD-10-CM | POA: Diagnosis present

## 2022-10-29 DIAGNOSIS — I2510 Atherosclerotic heart disease of native coronary artery without angina pectoris: Secondary | ICD-10-CM | POA: Diagnosis present

## 2022-10-29 DIAGNOSIS — J189 Pneumonia, unspecified organism: Secondary | ICD-10-CM | POA: Diagnosis present

## 2022-10-29 DIAGNOSIS — I4891 Unspecified atrial fibrillation: Secondary | ICD-10-CM | POA: Diagnosis not present

## 2022-10-29 DIAGNOSIS — I130 Hypertensive heart and chronic kidney disease with heart failure and stage 1 through stage 4 chronic kidney disease, or unspecified chronic kidney disease: Secondary | ICD-10-CM | POA: Diagnosis present

## 2022-10-29 DIAGNOSIS — D631 Anemia in chronic kidney disease: Secondary | ICD-10-CM | POA: Diagnosis present

## 2022-10-29 DIAGNOSIS — Z833 Family history of diabetes mellitus: Secondary | ICD-10-CM

## 2022-10-29 DIAGNOSIS — Z1152 Encounter for screening for COVID-19: Secondary | ICD-10-CM

## 2022-10-29 DIAGNOSIS — I4892 Unspecified atrial flutter: Secondary | ICD-10-CM | POA: Diagnosis not present

## 2022-10-29 DIAGNOSIS — E559 Vitamin D deficiency, unspecified: Secondary | ICD-10-CM | POA: Diagnosis present

## 2022-10-29 DIAGNOSIS — Z8249 Family history of ischemic heart disease and other diseases of the circulatory system: Secondary | ICD-10-CM

## 2022-10-29 DIAGNOSIS — M1990 Unspecified osteoarthritis, unspecified site: Secondary | ICD-10-CM | POA: Diagnosis present

## 2022-10-29 DIAGNOSIS — N182 Chronic kidney disease, stage 2 (mild): Secondary | ICD-10-CM | POA: Diagnosis present

## 2022-10-29 DIAGNOSIS — Z87891 Personal history of nicotine dependence: Secondary | ICD-10-CM

## 2022-10-29 DIAGNOSIS — Z79890 Hormone replacement therapy (postmenopausal): Secondary | ICD-10-CM

## 2022-10-29 DIAGNOSIS — Z7901 Long term (current) use of anticoagulants: Secondary | ICD-10-CM

## 2022-10-29 DIAGNOSIS — Z801 Family history of malignant neoplasm of trachea, bronchus and lung: Secondary | ICD-10-CM

## 2022-10-29 DIAGNOSIS — I63411 Cerebral infarction due to embolism of right middle cerebral artery: Secondary | ICD-10-CM | POA: Diagnosis not present

## 2022-10-29 DIAGNOSIS — E039 Hypothyroidism, unspecified: Secondary | ICD-10-CM | POA: Diagnosis present

## 2022-10-29 HISTORY — DX: Dorsalgia, unspecified: M54.9

## 2022-10-29 HISTORY — DX: Benign paroxysmal vertigo, bilateral: H81.13

## 2022-10-29 LAB — BASIC METABOLIC PANEL
Anion gap: 11 (ref 5–15)
BUN: 24 mg/dL — ABNORMAL HIGH (ref 8–23)
CO2: 20 mmol/L — ABNORMAL LOW (ref 22–32)
Calcium: 9.1 mg/dL (ref 8.9–10.3)
Chloride: 110 mmol/L (ref 98–111)
Creatinine, Ser: 1.03 mg/dL — ABNORMAL HIGH (ref 0.44–1.00)
GFR, Estimated: 55 mL/min — ABNORMAL LOW (ref >60)
Glucose, Bld: 123 mg/dL — ABNORMAL HIGH (ref 70–99)
Potassium: 4 mmol/L (ref 3.5–5.1)
Sodium: 141 mmol/L (ref 135–145)

## 2022-10-29 LAB — CBC WITH DIFFERENTIAL/PLATELET
Abs Immature Granulocytes: 0.06 10*3/uL (ref 0.00–0.07)
Basophils Absolute: 0.1 10*3/uL (ref 0.0–0.1)
Basophils Relative: 1 %
Eosinophils Absolute: 0 10*3/uL (ref 0.0–0.5)
Eosinophils Relative: 0 %
HCT: 33.9 % — ABNORMAL LOW (ref 36.0–46.0)
Hemoglobin: 10.5 g/dL — ABNORMAL LOW (ref 12.0–15.0)
Immature Granulocytes: 1 %
Lymphocytes Relative: 25 %
Lymphs Abs: 3.3 10*3/uL (ref 0.7–4.0)
MCH: 27.9 pg (ref 26.0–34.0)
MCHC: 31 g/dL (ref 30.0–36.0)
MCV: 89.9 fL (ref 80.0–100.0)
Monocytes Absolute: 1.1 10*3/uL — ABNORMAL HIGH (ref 0.1–1.0)
Monocytes Relative: 8 %
Neutro Abs: 8.8 10*3/uL — ABNORMAL HIGH (ref 1.7–7.7)
Neutrophils Relative %: 65 %
Platelets: 333 10*3/uL (ref 150–400)
RBC: 3.77 MIL/uL — ABNORMAL LOW (ref 3.87–5.11)
RDW: 15.2 % (ref 11.5–15.5)
WBC: 13.3 10*3/uL — ABNORMAL HIGH (ref 4.0–10.5)
nRBC: 0 % (ref 0.0–0.2)

## 2022-10-29 LAB — D-DIMER, QUANTITATIVE: D-Dimer, Quant: 2.37 ug/mL-FEU — ABNORMAL HIGH (ref 0.00–0.50)

## 2022-10-29 LAB — RESP PANEL BY RT-PCR (FLU A&B, COVID) ARPGX2
Influenza A by PCR: NEGATIVE
Influenza B by PCR: NEGATIVE
SARS Coronavirus 2 by RT PCR: NEGATIVE

## 2022-10-29 LAB — T4, FREE: Free T4: 1.02 ng/dL (ref 0.61–1.12)

## 2022-10-29 LAB — TSH: TSH: 2.462 u[IU]/mL (ref 0.350–4.500)

## 2022-10-29 LAB — TROPONIN I (HIGH SENSITIVITY)
Troponin I (High Sensitivity): 13 ng/L (ref <18)
Troponin I (High Sensitivity): 11 ng/L (ref <18)

## 2022-10-29 LAB — MAGNESIUM: Magnesium: 2.2 mg/dL (ref 1.7–2.4)

## 2022-10-29 MED ORDER — DILTIAZEM HCL 25 MG/5ML IV SOLN
10.0000 mg | Freq: Once | INTRAVENOUS | Status: AC
  Administered 2022-10-29: 10 mg via INTRAVENOUS
  Filled 2022-10-29: qty 5

## 2022-10-29 MED ORDER — SODIUM CHLORIDE 0.9 % IV SOLN
1.0000 g | Freq: Once | INTRAVENOUS | Status: AC
  Administered 2022-10-29: 1 g via INTRAVENOUS
  Filled 2022-10-29: qty 10

## 2022-10-29 MED ORDER — HEPARIN (PORCINE) 25000 UT/250ML-% IV SOLN
1000.0000 [IU]/h | INTRAVENOUS | Status: DC
  Administered 2022-10-29 – 2022-10-31 (×3): 1000 [IU]/h via INTRAVENOUS
  Filled 2022-10-29 (×2): qty 250

## 2022-10-29 MED ORDER — SODIUM CHLORIDE 0.9 % IV SOLN
500.0000 mg | Freq: Once | INTRAVENOUS | Status: AC
  Administered 2022-10-29: 500 mg via INTRAVENOUS
  Filled 2022-10-29: qty 5

## 2022-10-29 MED ORDER — ONDANSETRON HCL 4 MG/2ML IJ SOLN
4.0000 mg | Freq: Once | INTRAMUSCULAR | Status: AC
  Administered 2022-10-29: 4 mg via INTRAVENOUS
  Filled 2022-10-29: qty 2

## 2022-10-29 MED ORDER — HEPARIN BOLUS VIA INFUSION
3800.0000 [IU] | Freq: Once | INTRAVENOUS | Status: AC
  Administered 2022-10-29: 3800 [IU] via INTRAVENOUS

## 2022-10-29 MED ORDER — IOHEXOL 350 MG/ML SOLN
100.0000 mL | Freq: Once | INTRAVENOUS | Status: AC | PRN
  Administered 2022-10-29: 75 mL via INTRAVENOUS

## 2022-10-29 MED ORDER — METOPROLOL TARTRATE 5 MG/5ML IV SOLN
5.0000 mg | Freq: Once | INTRAVENOUS | Status: AC
  Administered 2022-10-29: 5 mg via INTRAVENOUS
  Filled 2022-10-29: qty 5

## 2022-10-29 MED ORDER — DILTIAZEM HCL-DEXTROSE 125-5 MG/125ML-% IV SOLN (PREMIX)
5.0000 mg/h | INTRAVENOUS | Status: DC
  Administered 2022-10-29: 5 mg/h via INTRAVENOUS
  Administered 2022-10-30 (×3): 15 mg/h via INTRAVENOUS
  Filled 2022-10-29 (×3): qty 125

## 2022-10-29 NOTE — Progress Notes (Signed)
ANTICOAGULATION CONSULT NOTE - Initial Consult  Pharmacy Consult for heparin infusion  Indication: atrial fibrillation  Allergies  Allergen Reactions   Duloxetine Nausea Only   Voltaren [Diclofenac Sodium] Shortness Of Breath and Palpitations   Aspirin Other (See Comments)    ulcers   Gabapentin Itching   Ultram [Tramadol Hcl] Other (See Comments)    Effects cognitive abilities    Proton Pump Inhibitors Rash    Itching, rash    Patient Measurements: Height: '5\' 7"'$  (170.2 cm) Weight: 78 kg (172 lb) IBW/kg (Calculated) : 61.6 Heparin Dosing Weight: 77.3kg   Vital Signs: Temp: 97.8 F (36.6 C) (11/20 1542) Temp Source: Tympanic (11/20 1542) BP: 136/98 (11/20 1630) Pulse Rate: 114 (11/20 1630)  Labs: Recent Labs    10/29/22 1552  HGB 10.5*  HCT 33.9*  PLT 333    CrCl cannot be calculated (Patient's most recent lab result is older than the maximum 21 days allowed.).   Medical History: Past Medical History:  Diagnosis Date   Arthritis    Chronic kidney disease    "stage 3 kidney failure" improving per pt   Headache    Hypothyroidism    Insomnia    Plantar fasciitis    Stomach ulcer ~2000   Stroke [facility])    TIA X2   2001, jan 2017   Vitamin D deficiency     Assessment: 81 yo F with new afib with RVR  -  no anticoagulants on recent fills. Was previously on eliquis 2.5 mg BID in 2022.   Hgb 10.5/Plt 333 - stable  Goal of Therapy:  Heparin level 0.3-0.7 units/ml Monitor platelets by anticoagulation protocol: Yes   Plan:  Heparin 3800 units IV x1 followed by heparin 1000 units/hr  8hr HL at 0100 Daily HL, CBC F/u s/sx bleeding F/u transition to enteral therapies as able/appropriate   Wilson Singer, PharmD Clinical Pharmacist 10/29/2022 4:42 PM

## 2022-10-29 NOTE — Plan of Care (Signed)
Leslie [name] date of birth 05/06/1941 being [facility] at the request of Dr. Regan Lemming for new onset A-fib with RVR.  Patient was recently being treated for a pneumonia in the outpatient setting and was prescribed Zithromax and cefdinir as well as steroids.  Completed steroids and course however has been having palpitations for last 3 weeks.  Not been on any anticoagulation and was found to be in new onset A-fib with RVR and sent to the ED via PCP.  Was started on a heparin drip as well as diltiazem drip with improvement of her rates.  Repeat chest x-ray today shows improvement of her right lung consolidation however she has a new left-sided infiltrate so she has been initiated on antibiotics again.  Cardiology is aware and will consult once patient gets here but will need to be notified.  She has been accepted to a cardiac telemetry floor and will need to follow-up on her COVID testing as this was still pending.

## 2022-10-29 NOTE — ED Notes (Signed)
Pt to and from CT dept via stretcher on continuous cardiac monitoring with Hep Drip and Cardizem drip infusing; pt escorted by this nurse who remains with patient during CT and for transport.  Upon return back to room [facility] staff standing by ready for transport to [facility]

## 2022-10-29 NOTE — ED Provider Notes (Incomplete Revision)
[place] EMERGENCY DEPT Provider Note   CSN: [id number] Arrival date & time: 10/29/22  1527     History  Chief Complaint  Patient presents with   new onset atrial fib    Rosey Eide is a 81 y.o. female.  HPI   81 year old female with medical history significant for hypothyroidism, vitamin D deficiency reported history of TIA in 2001 in 2017, CKD which has since resolved on recheck, chronic arthritis who presents to the emergency department with concern for palpitations, found at her PCP to be in atrial fibrillation with RVR that is new onset.  The patient states that she was treated for pneumonia with Zithromax, cefdinir and steroids 1 month ago.  She states that she is finishing out a course of steroids currently.  She has had palpitations for the past 2 to 3 weeks which she thinks.  She denies any chest pain or shortness of breath.  She denies any other symptoms.  Home Medications Prior to Admission medications   Medication Sig Start Date End Date Taking? Authorizing Provider  acetaminophen (TYLENOL) 325 MG tablet Take 1-2 tablets (325-650 mg total) by mouth every 6 (six) hours as needed for mild pain (pain score 1-3 or temp > 100.5). 10/06/21   Dorothyann Peng, PA-C  apixaban (ELIQUIS) 2.5 MG TABS tablet Take 1 tablet (2.5 mg total) by mouth 2 (two) times daily. 10/06/21 11/05/21  Dorothyann Peng, PA-C  Cholecalciferol (VITAMIN D) 50 MCG (2000 UT) tablet Take 2,000 Units by mouth daily.    [provider]  COLLAGEN PO Take 12 g by mouth daily.    [provider]  docusate sodium (COLACE) 100 MG capsule Take 1 capsule (100 mg total) by mouth 2 (two) times daily. 10/06/21   Dorothyann Peng, PA-C  famotidine (PEPCID) 20 MG tablet Take 20 mg by mouth 2 (two) times daily.    [provider]  Humidifier/Vaporizer Supplies [facility] VAPORIZER SCENT) PADS Inhale 1 Dose into the lungs daily as needed (congestion).    [provider]  HYDROcodone-acetaminophen (NORCO) 7.5-325 MG tablet Take 1-2 tablets by mouth every 4 (four) hours as needed for severe pain (pain score 7-10). 10/06/21   Dorothyann Peng, PA-C  levothyroxine (SYNTHROID) 88 MCG tablet Take 88 mcg by mouth daily before breakfast.    [provider]  ondansetron (ZOFRAN) 4 MG tablet Take 1 tablet (4 mg total) by mouth every 6 (six) hours as needed for nausea. 10/06/21   Dorothyann Peng, PA-C  Polyethyl Glycol-Propyl Glycol (SYSTANE OP) Place 1 drop into both eyes daily as needed (dry eyes).    [provider]  senna (SENOKOT) 8.6 MG TABS tablet Take 1 tablet (8.6 mg total) by mouth 2 (two) times daily. 10/06/21   Dorothyann Peng, PA-C      Allergies    Duloxetine, Voltaren [diclofenac sodium], Aspirin, Gabapentin, Ultram [tramadol hcl], and Proton pump inhibitors    Review of Systems   Review of Systems  All other systems reviewed and are negative.   Physical Exam Updated Vital Signs BP 123/80   Pulse (!) 135   Temp 97.8 F (36.6 C) (Tympanic)   Resp 15   Ht '5\' 7"'$  (1.702 m)   Wt 78 kg   SpO2 94%   BMI 26.94 kg/m  Physical Exam Vitals and nursing note reviewed.  Constitutional:      General: She is not in acute distress.    Appearance: She is well-developed.  HENT:  Head: Normocephalic and atraumatic.  Eyes:     Conjunctiva/sclera: Conjunctivae normal.  Cardiovascular:     Rate and Rhythm: Tachycardia present. Rhythm irregular.  Pulmonary:     Effort: Pulmonary effort is normal. No respiratory distress.     Breath sounds: Normal breath sounds.  Abdominal:     Palpations: Abdomen is soft.     Tenderness: There is no abdominal tenderness.  Musculoskeletal:        General: No swelling.     Cervical back: Neck supple.     Right lower leg: No edema.     Left lower leg: No edema.  Skin:    General: Skin is warm and dry.     Capillary Refill: Capillary refill takes less than 2 seconds.   Neurological:     General: No focal deficit present.     Mental Status: She is alert and oriented to person, place, and time. Mental status is at baseline.  Psychiatric:        Mood and Affect: Mood normal.     ED Results / Procedures / Treatments   Labs (all labs ordered are listed, but only abnormal results are displayed) Labs Reviewed  CBC WITH DIFFERENTIAL/PLATELET - Abnormal; Notable for the following components:      Result Value   WBC 13.3 (*)    RBC 3.77 (*)    Hemoglobin 10.5 (*)    HCT 33.9 (*)    Neutro Abs 8.8 (*)    Monocytes Absolute 1.1 (*)    All other components within normal limits  BASIC METABOLIC PANEL - Abnormal; Notable for the following components:   CO2 20 (*)    Glucose, Bld 123 (*)    BUN 24 (*)    Creatinine, Ser 1.03 (*)    GFR, Estimated 55 (*)    All other components within normal limits  RESP PANEL BY RT-PCR (FLU A&B, COVID) ARPGX2  CULTURE, BLOOD (ROUTINE X 2)  CULTURE, BLOOD (ROUTINE X 2)  MAGNESIUM  TSH  T4, FREE  HEPARIN LEVEL (UNFRACTIONATED)  HEPARIN LEVEL (UNFRACTIONATED)  CBC  TROPONIN I (HIGH SENSITIVITY)  TROPONIN I (HIGH SENSITIVITY)    EKG EKG Interpretation  Date/Time:  Monday October 29 2022 15:49:49 EST Ventricular Rate:  153 PR Interval:    QRS Duration: 64 QT Interval:  248 QTC Calculation: 395 R Axis:   46 Text Interpretation: Atrial fibrillation with rapid ventricular response Low voltage QRS Abnormal ECG When compared with ECG of 06-Feb-2011 16:00, Atrial fibrillation has replaced Sinus rhythm Vent. rate has increased BY  77 BPM Confirmed by Regan Lemming ([id number]) on 10/29/2022 3:53:12 PM  Radiology DG Chest Portable 1 View  Result Date: 10/29/2022 CLINICAL DATA:  Atrial fibrillation, palpitations EXAM: PORTABLE CHEST 1 VIEW COMPARISON:  10/23/2022 FINDINGS: Transverse diameter of heart is slightly increased. There are no signs of alveolar pulmonary edema. There is interval decrease in patchy infiltrates in  right parahilar region and right lower lung field. New small patchy infiltrate is seen in the lateral aspect of left lower lung field. There is no pleural effusion or pneumothorax. IMPRESSION: There is interval decrease in patchy infiltrates in right mid and right lower lung fields suggesting resolving atelectasis/pneumonia. There is new small patchy infiltrate in left lower lung fields suggesting multifocal pneumonia. Electronically Signed   By: Elmer Picker [name]   On: 10/29/2022 16:37    Procedures .Critical Care  Performed by: Regan Lemming, MD Authorized by: Regan Lemming, MD   Critical care provider statement:  Critical care time (minutes):  30   Critical care was time spent personally by me on the following activities:  Development of treatment plan with patient or surrogate, discussions with consultants, evaluation of patient's response to treatment, examination of patient, ordering and review of laboratory studies, ordering and review of radiographic studies, ordering and performing treatments and interventions, pulse oximetry, re-evaluation of patient's condition and review of old charts   Care discussed with: admitting provider       Medications Ordered in ED Medications  diltiazem (CARDIZEM) 125 mg in dextrose 5% 125 mL (1 mg/mL) infusion (5 mg/hr Intravenous New Bag/Given 10/29/22 1705)  heparin ADULT infusion 100 units/mL (25000 units/245m) (has no administration in time range)  heparin bolus via infusion 3,800 Units (has no administration in time range)  cefTRIAXone (ROCEPHIN) 1 g in sodium chloride 0.9 % 100 mL IVPB (has no administration in time range)  azithromycin (ZITHROMAX) 500 mg in sodium chloride 0.9 % 250 mL IVPB (has no administration in time range)  diltiazem (CARDIZEM) injection 10 mg (10 mg Intravenous Given 10/29/22 1619)    ED Course/ Medical Decision Making/ A&P                           Medical Decision Making Amount and/or Complexity of Data  Reviewed Labs: ordered. Radiology: ordered.  Risk Prescription drug management. Decision regarding hospitalization.   81year old female with medical history significant for hypothyroidism, vitamin D deficiency reported history of TIA in 2001 in 2017, CKD which has since resolved on recheck, chronic arthritis who presents to the emergency department with concern for palpitations, found at her PCP to be in atrial fibrillation with RVR that is new onset.  The patient states that she was treated for pneumonia with Zithromax, cefdinir and steroids 1 month ago.  She states that she is finishing out a course of steroids currently.  She has had palpitations for the past 2 to 3 weeks which she thinks.  She denies any chest pain or shortness of breath.  She denies any other symptoms.  Arrival, the patient was afebrile, with an irregular irregular rhythm noted on cardiac telemetry, noted to be in atrial fibrillation with RVR with a rate of 154, normotensive to hypertensive BP 138/110, saturating 97% on room air.  Patient presenting with palpitations for the last 2 to 3 weeks, concern for new onset atrial fibrillation with RVR.  She has no history of heart failure on chart review.  IV access was obtained and the patient was administered an IV 10 mg diltiazem bolus with subsequent improvement.   Initial EKG revealed atrial fibrillation with RVR, ventricular rate 153, normal intervals, no evidence of STEMI.  A chest x-ray was performed which revealed an interval decrease in patchy infiltrates in the right mid and lower lung suggesting a resolving atelectasis/pneumonia with a new small patchy infiltrate in the left lower lung field suggesting a multifocal pneumonia.  Laboratory evaluation significant for leukocytosis 13.3, anemia to 10.5, magnesium 2.2, BMP with mildly elevated BUN to 24, normal potassium, normal renal function, troponin initial at 13, TSH and T4 pending, COVID-19 and influenza PCR testing  pending, blood cultures x2 collected and pending.  The patient was administered IV Rocephin and azithromycin as she was tachycardic, with a leukocytosis and findings of pneumonia on x-ray.  Cardiology was consulted due to new onset atrial fibrillation with RVR.  The patient was started on a diltiazem infusion and a heparin infusion.  Hospitalist  medicine was consulted for admission. Dr. Sallyanne Kuster was consulted and will see the patient in admission. Dr. Alfredia Ferguson accepted the patient to the hospitalist service in admission.  Note: Dimer collected and resulted positive. Will obtain CTA PE.   Final Clinical Impression(s) / ED Diagnoses Final diagnoses:  Atrial fibrillation with rapid ventricular response ([place])  Community acquired pneumonia of left lower lobe of lung    Rx / DC Orders ED Discharge Orders     None         Regan Lemming, MD 10/29/22 1724

## 2022-10-29 NOTE — ED Notes (Signed)
Only able to get blue top on 2nd set of cultures. 2 Rn's 3 Ivs accessed, multiple sticks

## 2022-10-29 NOTE — ED Provider Notes (Addendum)
[facility] EMERGENCY DEPT Provider Note   CSN: [id number] Arrival date & time: 10/29/22  1527     History  Chief Complaint  Patient presents with   new onset atrial fib    Leslie [name] is a 81 y.o. female.  HPI   81 year old female with medical history significant for hypothyroidism, vitamin D deficiency reported history of TIA in 2001 in 2017, CKD which has since resolved on recheck, chronic arthritis who presents to the emergency department with concern for palpitations, found at her PCP to be in atrial fibrillation with RVR that is new onset.  The patient states that she was treated for pneumonia with Zithromax, cefdinir and steroids 1 month ago.  She states that she is finishing out a course of steroids currently.  She has had palpitations for the past 2 to 3 weeks which she thinks.  She denies any chest pain or shortness of breath.  She denies any other symptoms.  Home Medications Prior to Admission medications   Medication Sig Start Date End Date Taking? Authorizing Provider  acetaminophen (TYLENOL) 325 MG tablet Take 1-2 tablets (325-650 mg total) by mouth every 6 (six) hours as needed for mild pain (pain score 1-3 or temp > 100.5). 10/06/21   Darrick Grinder, PA-C  apixaban (ELIQUIS) 2.5 MG TABS tablet Take 1 tablet (2.5 mg total) by mouth 2 (two) times daily. 10/06/21 11/05/21  Darrick Grinder, PA-C  Cholecalciferol (VITAMIN D) 50 MCG (2000 UT) tablet Take 2,000 Units by mouth daily.    [provider]  COLLAGEN PO Take 12 g by mouth daily.    [provider]  docusate sodium (COLACE) 100 MG capsule Take 1 capsule (100 mg total) by mouth 2 (two) times daily. 10/06/21   Darrick Grinder, PA-C  famotidine (PEPCID) 20 MG tablet Take 20 mg by mouth 2 (two) times daily.    [provider]  Humidifier/Vaporizer Supplies [facility] VAPORIZER SCENT) PADS Inhale 1 Dose into the lungs daily as needed (congestion).    [provider]  HYDROcodone-acetaminophen (NORCO) 7.5-325 MG tablet Take 1-2 tablets by mouth every 4 (four) hours as needed for severe pain (pain score 7-10). 10/06/21   Darrick Grinder, PA-C  levothyroxine (SYNTHROID) 88 MCG tablet Take 88 mcg by mouth daily before breakfast.    [provider]  ondansetron (ZOFRAN) 4 MG tablet Take 1 tablet (4 mg total) by mouth every 6 (six) hours as needed for nausea. 10/06/21   Darrick Grinder, PA-C  Polyethyl Glycol-Propyl Glycol (SYSTANE OP) Place 1 drop into both eyes daily as needed (dry eyes).    [provider]  senna (SENOKOT) 8.6 MG TABS tablet Take 1 tablet (8.6 mg total) by mouth 2 (two) times daily. 10/06/21   Darrick Grinder, PA-C      Allergies    Duloxetine, Voltaren [diclofenac sodium], Aspirin, Gabapentin, Ultram [tramadol hcl], and Proton pump inhibitors    Review of Systems   Review of Systems  All other systems reviewed and are negative.   Physical Exam Updated Vital Signs BP 123/80   Pulse (!) 135   Temp 97.8 F (36.6 C) (Tympanic)   Resp 15   Ht 5\' 7"  (1.702 m)   Wt 78 kg   SpO2 94%   BMI 26.94 kg/m  Physical Exam Vitals and nursing note reviewed.  Constitutional:      General: She is not in acute distress.    Appearance: She is well-developed.  HENT:  Head: Normocephalic and atraumatic.  Eyes:     Conjunctiva/sclera: Conjunctivae normal.  Cardiovascular:     Rate and Rhythm: Tachycardia present. Rhythm irregular.  Pulmonary:     Effort: Pulmonary effort is normal. No respiratory distress.     Breath sounds: Normal breath sounds.  Abdominal:     Palpations: Abdomen is soft.     Tenderness: There is no abdominal tenderness.  Musculoskeletal:        General: No swelling.     Cervical back: Neck supple.     Right lower leg: No edema.     Left lower leg: No edema.  Skin:    General: Skin is warm and dry.     Capillary Refill: Capillary refill takes less than 2 seconds.   Neurological:     General: No focal deficit present.     Mental Status: She is alert and oriented to person, place, and time. Mental status is at baseline.  Psychiatric:        Mood and Affect: Mood normal.     ED Results / Procedures / Treatments   Labs (all labs ordered are listed, but only abnormal results are displayed) Labs Reviewed  CBC WITH DIFFERENTIAL/PLATELET - Abnormal; Notable for the following components:      Result Value   WBC 13.3 (*)    RBC 3.77 (*)    Hemoglobin 10.5 (*)    HCT 33.9 (*)    Neutro Abs 8.8 (*)    Monocytes Absolute 1.1 (*)    All other components within normal limits  BASIC METABOLIC PANEL - Abnormal; Notable for the following components:   CO2 20 (*)    Glucose, Bld 123 (*)    BUN 24 (*)    Creatinine, Ser 1.03 (*)    GFR, Estimated 55 (*)    All other components within normal limits  RESP PANEL BY RT-PCR (FLU A&B, COVID) ARPGX2  CULTURE, BLOOD (ROUTINE X 2)  CULTURE, BLOOD (ROUTINE X 2)  MAGNESIUM  TSH  T4, FREE  HEPARIN LEVEL (UNFRACTIONATED)  HEPARIN LEVEL (UNFRACTIONATED)  CBC  TROPONIN I (HIGH SENSITIVITY)  TROPONIN I (HIGH SENSITIVITY)    EKG EKG Interpretation  Date/Time:  Monday October 29 2022 15:49:49 EST Ventricular Rate:  153 PR Interval:    QRS Duration: 64 QT Interval:  248 QTC Calculation: 395 R Axis:   46 Text Interpretation: Atrial fibrillation with rapid ventricular response Low voltage QRS Abnormal ECG When compared with ECG of 06-Feb-2011 16:00, Atrial fibrillation has replaced Sinus rhythm Vent. rate has increased BY  77 BPM Confirmed by Ernie Avena ([id number]) on 10/29/2022 3:53:12 PM  Radiology DG Chest Portable 1 View  Result Date: 10/29/2022 CLINICAL DATA:  Atrial fibrillation, palpitations EXAM: PORTABLE CHEST 1 VIEW COMPARISON:  10/23/2022 FINDINGS: Transverse diameter of heart is slightly increased. There are no signs of alveolar pulmonary edema. There is interval decrease in patchy infiltrates in  right parahilar region and right lower lung field. New small patchy infiltrate is seen in the lateral aspect of left lower lung field. There is no pleural effusion or pneumothorax. IMPRESSION: There is interval decrease in patchy infiltrates in right mid and right lower lung fields suggesting resolving atelectasis/pneumonia. There is new small patchy infiltrate in left lower lung fields suggesting multifocal pneumonia. Electronically Signed   By: Ernie Avena [name]   On: 10/29/2022 16:37    Procedures .Critical Care  Performed by: Ernie Avena, MD Authorized by: Ernie Avena, MD   Critical care provider statement:  Critical care time (minutes):  30   Critical care was time spent personally by me on the following activities:  Development of treatment plan with patient or surrogate, discussions with consultants, evaluation of patient's response to treatment, examination of patient, ordering and review of laboratory studies, ordering and review of radiographic studies, ordering and performing treatments and interventions, pulse oximetry, re-evaluation of patient's condition and review of old charts   Care discussed with: admitting provider       Medications Ordered in ED Medications  diltiazem (CARDIZEM) 125 mg in dextrose 5% 125 mL (1 mg/mL) infusion (5 mg/hr Intravenous New Bag/Given 10/29/22 1705)  heparin ADULT infusion 100 units/mL (25000 units/262mL) (has no administration in time range)  heparin bolus via infusion 3,800 Units (has no administration in time range)  cefTRIAXone (ROCEPHIN) 1 g in sodium chloride 0.9 % 100 mL IVPB (has no administration in time range)  azithromycin (ZITHROMAX) 500 mg in sodium chloride 0.9 % 250 mL IVPB (has no administration in time range)  diltiazem (CARDIZEM) injection 10 mg (10 mg Intravenous Given 10/29/22 1619)    ED Course/ Medical Decision Making/ A&P                           Medical Decision Making Amount and/or Complexity of Data  Reviewed Labs: ordered. Radiology: ordered.  Risk Prescription drug management. Decision regarding hospitalization.   81 year old female with medical history significant for hypothyroidism, vitamin D deficiency reported history of TIA in 2001 in 2017, CKD which has since resolved on recheck, chronic arthritis who presents to the emergency department with concern for palpitations, found at her PCP to be in atrial fibrillation with RVR that is new onset.  The patient states that she was treated for pneumonia with Zithromax, cefdinir and steroids 1 month ago.  She states that she is finishing out a course of steroids currently.  She has had palpitations for the past 2 to 3 weeks which she thinks.  She denies any chest pain or shortness of breath.  She denies any other symptoms.  Arrival, the patient was afebrile, with an irregular irregular rhythm noted on cardiac telemetry, noted to be in atrial fibrillation with RVR with a rate of 154, normotensive to hypertensive BP 138/110, saturating 97% on room air.  Patient presenting with palpitations for the last 2 to 3 weeks, concern for new onset atrial fibrillation with RVR.  She has no history of heart failure on chart review.  IV access was obtained and the patient was administered an IV 10 mg diltiazem bolus with subsequent improvement.   Initial EKG revealed atrial fibrillation with RVR, ventricular rate 153, normal intervals, no evidence of STEMI.  A chest x-ray was performed which revealed an interval decrease in patchy infiltrates in the right mid and lower lung suggesting a resolving atelectasis/pneumonia with a new small patchy infiltrate in the left lower lung field suggesting a multifocal pneumonia.  Laboratory evaluation significant for leukocytosis 13.3, anemia to 10.5, magnesium 2.2, BMP with mildly elevated BUN to 24, normal potassium, normal renal function, troponin initial at 13, TSH and T4 pending, COVID-19 and influenza PCR testing  pending, blood cultures x2 collected and pending.  The patient was administered IV Rocephin and azithromycin as she was tachycardic, with a leukocytosis and findings of pneumonia on x-ray.  Cardiology was consulted due to new onset atrial fibrillation with RVR.  The patient was started on a diltiazem infusion and a heparin infusion.  Hospitalist  medicine was consulted for admission. Dr. Royann Shivers was consulted and will see the patient in admission. Dr. Marland Mcalpine accepted the patient to the hospitalist service in admission.  Note: Dimer collected and resulted positive. Will obtain CTA PE.  CTA Resulted: IMPRESSION:  Right lower lobe consolidation with moderate right pleural effusion.  Tree-in-bud nodular densities in the right upper lobe. Findings  compatible with pneumonia.    No evidence of pulmonary embolus.    Coronary artery disease.    Aortic Atherosclerosis (ICD10-I70.0).   Pt already covered with ABX, stable at time of admission.   Final Clinical Impression(s) / ED Diagnoses Final diagnoses:  Atrial fibrillation with rapid ventricular response ([name])  Community acquired pneumonia of left lower lobe of lung    Rx / DC Orders ED Discharge Orders     None         Ernie Avena, MD 10/29/22 1724    Ernie Avena, MD 10/30/22 0022

## 2022-10-29 NOTE — ED Triage Notes (Signed)
Pt was just seen by PCP and dx with new onset of Atrial fibrillation with RVR. Also states she has pneumonia has been treated with Zithromax, Cefdinir and steroids.  PCP sent here for further evaluation.   States she has had palpitations

## 2022-10-29 NOTE — ED Notes (Addendum)
This nurse was called to bedside d/t pt c/o new onset sob -- RT also to bedside.  RT reports lung sounds CTA.  Pt reports sudden onset sob while self-positioning to R lateral side.  Found pt slumped down slightly in bed on her back upon entering; pt then repositioned to high fowlers.  O2 sats 93-96% on RA.  Provider, Dr. Armandina Gemma, notified via secure chat and aware of  elevated D-Dimer 2.37 ; CTA PE protocol now pending.  Pt remains on cardiac monitoring; controlled A-Fib high HR 85 at this time.  Cardizem drip continues to infuse '@15mg'$ /hr to posterior R FA 20G and hep drip infusing '@10ml'$ /hr to 20G L AC

## 2022-10-29 NOTE — ED Notes (Signed)
Pt reported some pressure in chest. This tech took EKG. Notified nurse. When pt was repositioned and said that the pressure was not really there anymore.

## 2022-10-30 ENCOUNTER — Encounter (HOSPITAL_COMMUNITY)
Admission: EM | Disposition: A | Discharge status: Rehabilitation Facility | Payer: Self-pay | Source: Home / Self Care | Attending: Family Medicine

## 2022-10-30 ENCOUNTER — Inpatient Hospital Stay (HOSPITAL_COMMUNITY): Payer: Medicare HMO

## 2022-10-30 ENCOUNTER — Encounter (HOSPITAL_COMMUNITY): Payer: Self-pay | Admitting: Family Medicine

## 2022-10-30 DIAGNOSIS — I5023 Acute on chronic systolic (congestive) heart failure: Secondary | ICD-10-CM | POA: Diagnosis not present

## 2022-10-30 DIAGNOSIS — Z79899 Other long term (current) drug therapy: Secondary | ICD-10-CM | POA: Diagnosis not present

## 2022-10-30 DIAGNOSIS — N183 Chronic kidney disease, stage 3 unspecified: Secondary | ICD-10-CM | POA: Diagnosis not present

## 2022-10-30 DIAGNOSIS — J189 Pneumonia, unspecified organism: Secondary | ICD-10-CM

## 2022-10-30 DIAGNOSIS — I700 Atherosclerosis of aorta: Secondary | ICD-10-CM | POA: Diagnosis present

## 2022-10-30 DIAGNOSIS — I4819 Other persistent atrial fibrillation: Secondary | ICD-10-CM | POA: Diagnosis present

## 2022-10-30 DIAGNOSIS — R0609 Other forms of dyspnea: Secondary | ICD-10-CM

## 2022-10-30 DIAGNOSIS — I6601 Occlusion and stenosis of right middle cerebral artery: Secondary | ICD-10-CM | POA: Diagnosis not present

## 2022-10-30 DIAGNOSIS — I340 Nonrheumatic mitral (valve) insufficiency: Secondary | ICD-10-CM | POA: Diagnosis present

## 2022-10-30 DIAGNOSIS — I4891 Unspecified atrial fibrillation: Secondary | ICD-10-CM | POA: Diagnosis present

## 2022-10-30 DIAGNOSIS — I63511 Cerebral infarction due to unspecified occlusion or stenosis of right middle cerebral artery: Secondary | ICD-10-CM | POA: Diagnosis not present

## 2022-10-30 DIAGNOSIS — I4892 Unspecified atrial flutter: Secondary | ICD-10-CM | POA: Diagnosis not present

## 2022-10-30 DIAGNOSIS — R471 Dysarthria and anarthria: Secondary | ICD-10-CM | POA: Diagnosis present

## 2022-10-30 DIAGNOSIS — Z1152 Encounter for screening for COVID-19: Secondary | ICD-10-CM | POA: Diagnosis not present

## 2022-10-30 DIAGNOSIS — N179 Acute kidney failure, unspecified: Secondary | ICD-10-CM | POA: Diagnosis not present

## 2022-10-30 DIAGNOSIS — K219 Gastro-esophageal reflux disease without esophagitis: Secondary | ICD-10-CM | POA: Diagnosis present

## 2022-10-30 DIAGNOSIS — R29719 NIHSS score 19: Secondary | ICD-10-CM | POA: Diagnosis not present

## 2022-10-30 DIAGNOSIS — I130 Hypertensive heart and chronic kidney disease with heart failure and stage 1 through stage 4 chronic kidney disease, or unspecified chronic kidney disease: Secondary | ICD-10-CM | POA: Diagnosis present

## 2022-10-30 DIAGNOSIS — E785 Hyperlipidemia, unspecified: Secondary | ICD-10-CM | POA: Diagnosis present

## 2022-10-30 DIAGNOSIS — M1990 Unspecified osteoarthritis, unspecified site: Secondary | ICD-10-CM | POA: Diagnosis present

## 2022-10-30 DIAGNOSIS — I951 Orthostatic hypotension: Secondary | ICD-10-CM | POA: Diagnosis not present

## 2022-10-30 DIAGNOSIS — G8194 Hemiplegia, unspecified affecting left nondominant side: Secondary | ICD-10-CM | POA: Diagnosis not present

## 2022-10-30 DIAGNOSIS — I69354 Hemiplegia and hemiparesis following cerebral infarction affecting left non-dominant side: Secondary | ICD-10-CM | POA: Diagnosis not present

## 2022-10-30 DIAGNOSIS — J440 Chronic obstructive pulmonary disease with acute lower respiratory infection: Secondary | ICD-10-CM | POA: Diagnosis present

## 2022-10-30 DIAGNOSIS — Z87891 Personal history of nicotine dependence: Secondary | ICD-10-CM | POA: Diagnosis not present

## 2022-10-30 DIAGNOSIS — E43 Unspecified severe protein-calorie malnutrition: Secondary | ICD-10-CM | POA: Diagnosis not present

## 2022-10-30 DIAGNOSIS — E039 Hypothyroidism, unspecified: Secondary | ICD-10-CM | POA: Diagnosis present

## 2022-10-30 DIAGNOSIS — I5030 Unspecified diastolic (congestive) heart failure: Secondary | ICD-10-CM | POA: Diagnosis not present

## 2022-10-30 DIAGNOSIS — I63411 Cerebral infarction due to embolism of right middle cerebral artery: Secondary | ICD-10-CM | POA: Diagnosis not present

## 2022-10-30 DIAGNOSIS — J439 Emphysema, unspecified: Secondary | ICD-10-CM | POA: Diagnosis present

## 2022-10-30 DIAGNOSIS — I2510 Atherosclerotic heart disease of native coronary artery without angina pectoris: Secondary | ICD-10-CM | POA: Diagnosis present

## 2022-10-30 DIAGNOSIS — D6869 Other thrombophilia: Secondary | ICD-10-CM | POA: Diagnosis not present

## 2022-10-30 DIAGNOSIS — J90 Pleural effusion, not elsewhere classified: Secondary | ICD-10-CM

## 2022-10-30 DIAGNOSIS — J9811 Atelectasis: Secondary | ICD-10-CM | POA: Diagnosis not present

## 2022-10-30 DIAGNOSIS — R414 Neurologic neglect syndrome: Secondary | ICD-10-CM | POA: Diagnosis not present

## 2022-10-30 DIAGNOSIS — D631 Anemia in chronic kidney disease: Secondary | ICD-10-CM | POA: Diagnosis present

## 2022-10-30 DIAGNOSIS — I69311 Memory deficit following cerebral infarction: Secondary | ICD-10-CM | POA: Diagnosis not present

## 2022-10-30 DIAGNOSIS — I5022 Chronic systolic (congestive) heart failure: Secondary | ICD-10-CM | POA: Diagnosis not present

## 2022-10-30 DIAGNOSIS — E1122 Type 2 diabetes mellitus with diabetic chronic kidney disease: Secondary | ICD-10-CM | POA: Diagnosis present

## 2022-10-30 HISTORY — PX: THORACENTESIS: SHX235

## 2022-10-30 LAB — ECHOCARDIOGRAM COMPLETE
Height: 67.5 in
MV M vel: 4.6 m/s
MV Peak grad: 84.6 mmHg
Radius: 0.8 cm
S' Lateral: 3.5 cm
Weight: 2744.29 oz

## 2022-10-30 LAB — LACTATE DEHYDROGENASE: LDH: 185 U/L (ref 98–192)

## 2022-10-30 LAB — HEPARIN LEVEL (UNFRACTIONATED): Heparin Unfractionated: 0.39 IU/mL (ref 0.30–0.70)

## 2022-10-30 LAB — BASIC METABOLIC PANEL
Anion gap: 11 (ref 5–15)
BUN: 22 mg/dL (ref 8–23)
CO2: 18 mmol/L — ABNORMAL LOW (ref 22–32)
Calcium: 9 mg/dL (ref 8.9–10.3)
Chloride: 111 mmol/L (ref 98–111)
Creatinine, Ser: 0.98 mg/dL (ref 0.44–1.00)
GFR, Estimated: 58 mL/min — ABNORMAL LOW (ref >60)
Glucose, Bld: 179 mg/dL — ABNORMAL HIGH (ref 70–99)
Potassium: 5.1 mmol/L (ref 3.5–5.1)
Sodium: 140 mmol/L (ref 135–145)

## 2022-10-30 LAB — HIV ANTIBODY (ROUTINE TESTING W REFLEX): HIV Screen 4th Generation wRfx: NONREACTIVE

## 2022-10-30 LAB — CBC
HCT: 33 % — ABNORMAL LOW (ref 36.0–46.0)
Hemoglobin: 10.6 g/dL — ABNORMAL LOW (ref 12.0–15.0)
MCH: 28.2 pg (ref 26.0–34.0)
MCHC: 32.1 g/dL (ref 30.0–36.0)
MCV: 87.8 fL (ref 80.0–100.0)
Platelets: 298 10*3/uL (ref 150–400)
RBC: 3.76 MIL/uL — ABNORMAL LOW (ref 3.87–5.11)
RDW: 15.1 % (ref 11.5–15.5)
WBC: 10.8 10*3/uL — ABNORMAL HIGH (ref 4.0–10.5)
nRBC: 0 % (ref 0.0–0.2)

## 2022-10-30 LAB — BODY FLUID CELL COUNT WITH DIFFERENTIAL
Eos, Fluid: 0 %
Lymphs, Fluid: 81 %
Monocyte-Macrophage-Serous Fluid: 17 % — ABNORMAL LOW (ref 50–90)
Neutrophil Count, Fluid: 2 % (ref 0–25)
Total Nucleated Cell Count, Fluid: 279 cu mm (ref 0–1000)

## 2022-10-30 LAB — GLUCOSE, PLEURAL OR PERITONEAL FLUID: Glucose, Fluid: 175 mg/dL

## 2022-10-30 LAB — HEMOGLOBIN A1C
Hgb A1c MFr Bld: 6.2 % — ABNORMAL HIGH (ref 4.8–5.6)
Mean Plasma Glucose: 131.24 mg/dL

## 2022-10-30 LAB — LACTATE DEHYDROGENASE, PLEURAL OR PERITONEAL FLUID: LD, Fluid: 55 U/L — ABNORMAL HIGH (ref 3–23)

## 2022-10-30 LAB — STREP PNEUMONIAE URINARY ANTIGEN: Strep Pneumo Urinary Antigen: NEGATIVE

## 2022-10-30 LAB — PROTEIN, PLEURAL OR PERITONEAL FLUID: Total protein, fluid: <3 g/dL

## 2022-10-30 LAB — MRSA NEXT GEN BY PCR, NASAL: MRSA by PCR Next Gen: NOT DETECTED

## 2022-10-30 SURGERY — THORACENTESIS
Anesthesia: LOCAL

## 2022-10-30 MED ORDER — SODIUM CHLORIDE 0.9 % IV SOLN
2.0000 g | INTRAVENOUS | Status: AC
  Administered 2022-10-30 – 2022-11-05 (×7): 2 g via INTRAVENOUS
  Filled 2022-10-30 (×7): qty 20

## 2022-10-30 MED ORDER — GUAIFENESIN 100 MG/5ML PO LIQD: 5.0000 mL | ORAL | Status: DC | PRN

## 2022-10-30 MED ORDER — ONDANSETRON HCL 4 MG PO TABS
4.0000 mg | ORAL_TABLET | Freq: Three times a day (TID) | ORAL | Status: DC | PRN
  Administered 2022-11-02 – 2022-11-06 (×3): 4 mg via ORAL
  Filled 2022-10-30 (×3): qty 1

## 2022-10-30 MED ORDER — METOPROLOL TARTRATE 50 MG PO TABS
50.0000 mg | ORAL_TABLET | Freq: Two times a day (BID) | ORAL | Status: DC
  Administered 2022-10-30 (×2): 50 mg via ORAL
  Filled 2022-10-30 (×2): qty 1

## 2022-10-30 MED ORDER — IPRATROPIUM-ALBUTEROL 0.5-2.5 (3) MG/3ML IN SOLN: 3.0000 mL | RESPIRATORY_TRACT | Status: DC | PRN

## 2022-10-30 MED ORDER — AZITHROMYCIN 250 MG PO TABS
500.0000 mg | ORAL_TABLET | Freq: Every day | ORAL | Status: AC
  Administered 2022-10-30 – 2022-11-03 (×5): 500 mg via ORAL
  Filled 2022-10-30 (×5): qty 2

## 2022-10-30 NOTE — H&P (Signed)
PCP:   Sonia Side., FNP   Chief Complaint: Atrial fibrillation with RVR  HPI: This is a 81 year old female with past medical history of hypothyroidism and GERD.  In October, the patient had a series of unfortunate events.  Her bathroom blocked up, [employer] came and tore her floors up.  The smells irritates her respiratory passages and caused nausea and vomiting.  She went on to develop a hacking nonproductive cough.  She saw her PCP, he started on 2 antibiotics and p.o. steroids.  She states she did not get better.  Additionally, she developed palpitations that were worse at night.  She was short of breath, and had a mild wheeze.  She denies fever, chills.  She endorse some lightheadedness and dizziness and was very weak.  She had no appetite..  She returned to her PCP today where EKG was done revealing A-fib with RVR.  The patient was directed to the ER.  Patient has no prior history of atrial fibrillation.  Patient transferred from freestanding ER.  History provided by the patient  Review of Systems:  The patient denies anorexia, fever, weight loss,, vision loss, decreased hearing, hoarseness, chest pain, syncope, dyspnea on exertion, peripheral edema, balance deficits, hemoptysis, abdominal pain, melena, hematochezia, severe indigestion/heartburn, hematuria, incontinence, genital sores, muscle weakness, suspicious skin lesions, transient blindness, difficulty walking, depression, unusual weight change, abnormal bleeding, enlarged lymph nodes, angioedema, and breast masses. Positive: Cough, palpitation, shortness of breath, lightheadedness, dizziness, nausea, vomiting, wheeze, weakness  Past Medical History: Past Medical History:  Diagnosis Date   Arthritis    Chronic kidney disease    "stage 3 kidney failure" improving per pt   Headache    Hypothyroidism    Insomnia    Plantar fasciitis    Stomach ulcer ~2000   Stroke [facility])    TIA X2   2001, jan 2017   Vitamin D deficiency     Past Surgical History:  Procedure Laterality Date   ABDOMINAL HYSTERECTOMY  1974   ANKLE FUSION Right 06/07/2016   Procedure: RIGHT TALONAVICULAR ARTHRODESIS ;  Surgeon: Wylene Simmer, MD;  Location: [place];  Service: Orthopedics;  Laterality: Right;   APPENDECTOMY     BACK SURGERY     x4  fusion T12 thru sacrum; U rod in sacrum   CATARACT EXTRACTION, BILATERAL Bilateral 2015   GASTROCNEMIUS RECESSION Right 06/07/2016   Procedure: RIGHT GASTROC RECESSION ;  Surgeon: Wylene Simmer, MD;  Location: [place];  Service: Orthopedics;  Laterality: Right;   HAND SURGERY Left 2003   x2   L heel surgery for torn tendon Left 2015   PLANTAR FASCIA SURGERY Right 2009   surgery at [place] Right 2007   TENDON REPAIR Right 1949   foot- lacerated tendon in arch of foot   TOTAL HIP ARTHROPLASTY Left 10/05/2021   Procedure: TOTAL HIP ARTHROPLASTY ANTERIOR APPROACH;  Surgeon: Rod Can, MD;  Location: [facility];  Service: Orthopedics;  Laterality: Left;    Medications: Prior to Admission medications   Medication Sig Start Date End Date Taking? Authorizing Provider  acetaminophen (TYLENOL) 325 MG tablet Take 1-2 tablets (325-650 mg total) by mouth every 6 (six) hours as needed for mild pain (pain score 1-3 or temp > 100.5). 10/06/21   Dorothyann Peng, PA-C  apixaban (ELIQUIS) 2.5 MG TABS tablet Take 1 tablet (2.5 mg total) by mouth 2 (two) times daily. 10/06/21 11/05/21  Dorothyann Peng, PA-C  Cholecalciferol (VITAMIN D)  50 MCG (2000 UT) tablet Take 2,000 Units by mouth daily.    [provider]  COLLAGEN PO Take 12 g by mouth daily.    [provider]  docusate sodium (COLACE) 100 MG capsule Take 1 capsule (100 mg total) by mouth 2 (two) times daily. 10/06/21   Dorothyann Peng, PA-C  famotidine (PEPCID) 20 MG tablet Take 20 mg by mouth 2 (two) times daily.    [provider]  Humidifier/Vaporizer Supplies [facility]  VAPORIZER SCENT) PADS Inhale 1 Dose into the lungs daily as needed (congestion).    [provider]  HYDROcodone-acetaminophen (NORCO) 7.5-325 MG tablet Take 1-2 tablets by mouth every 4 (four) hours as needed for severe pain (pain score 7-10). 10/06/21   Dorothyann Peng, PA-C  levothyroxine (SYNTHROID) 88 MCG tablet Take 88 mcg by mouth daily before breakfast.    [provider]  ondansetron (ZOFRAN) 4 MG tablet Take 1 tablet (4 mg total) by mouth every 6 (six) hours as needed for nausea. 10/06/21   Dorothyann Peng, PA-C  Polyethyl Glycol-Propyl Glycol (SYSTANE OP) Place 1 drop into both eyes daily as needed (dry eyes).    [provider]  senna (SENOKOT) 8.6 MG TABS tablet Take 1 tablet (8.6 mg total) by mouth 2 (two) times daily. 10/06/21   Dorothyann Peng, PA-C    Allergies:   Allergies  Allergen Reactions   Duloxetine Nausea Only   Voltaren [Diclofenac Sodium] Shortness Of Breath and Palpitations   Aspirin Other (See Comments)    ulcers   Gabapentin Itching   Ultram [Tramadol Hcl] Other (See Comments)    Effects cognitive abilities    Proton Pump Inhibitors Rash    Itching, rash    Social History:  reports that she quit smoking about 36 years ago. Her smoking use included cigarettes. She has never used smokeless tobacco. She reports that she does not drink alcohol and does not use drugs.  Family History: Family History  Problem Relation Age of Onset   Hypertension Mother    Lung cancer Mother    Lung cancer Father    Diabetes Sister    Hypertension Sister    Congestive Heart Failure Sister    Arthritis Brother     Physical Exam: Vitals:   10/29/22 2245 10/29/22 2300 10/30/22 0016 10/30/22 0028  BP: (!) 124/90 (!) 128/101  (!) 120/90  Pulse: (!) 106 82  73  Resp: '18 19  20  '$ Temp:    (!) 97.5 F (36.4 C)  TempSrc:    Oral  SpO2: 94% 95%  96%  Weight:   77.8 kg   Height:   5' 7.5" (1.715 m)     General:  Alert and oriented times  three, well developed and nourished, no acute distress Eyes: PERRLA, pink conjunctiva, no scleral icterus ENT: Moist oral mucosa, neck supple, no thyromegaly Lungs: clear to ascultation, no wheeze, no crackles, no use of accessory muscles Cardiovascular: Tachycardia, irregular rate and rhythm, no regurgitation, no gallops, no murmurs. No carotid bruits, no JVD Abdomen: soft, positive BS, non-tender, non-distended, no organomegaly, not an acute abdomen GU: not examined Neuro: CN II - XII grossly intact, sensation intact Musculoskeletal: strength 5/5 all extremities, no clubbing, cyanosis or edema Skin: no rash, no subcutaneous crepitation, no decubitus Psych: appropriate patient   Labs on Admission:  Recent Labs    10/29/22 1552  NA 141  K 4.0  CL 110  CO2 20*  GLUCOSE 123*  BUN 24*  CREATININE 1.03*  CALCIUM 9.1  MG 2.2    Recent Labs    10/29/22 1552 10/30/22 0036  WBC 13.3* 10.8*  NEUTROABS 8.8*  --   HGB 10.5* 10.6*  HCT 33.9* 33.0*  MCV 89.9 87.8  PLT 333 298    Recent Labs    10/29/22 2057  DDIMER 2.37*    Recent Labs    10/29/22 1552  TSH 2.462    Micro Results: Recent Results (from the past 240 hour(s))  Resp Panel by RT-PCR (Flu A&B, Covid) Anterior Nasal Swab     Status: None   Collection Time: 10/29/22  4:36 PM   Specimen: Anterior Nasal Swab  Result Value Ref Range Status   SARS Coronavirus 2 by RT PCR NEGATIVE NEGATIVE Final    Comment: (NOTE) SARS-CoV-2 target nucleic acids are NOT DETECTED.  The SARS-CoV-2 RNA is generally detectable in upper respiratory specimens during the acute phase of infection. The lowest concentration of SARS-CoV-2 viral copies this assay can detect is 138 copies/mL. A negative result does not preclude SARS-Cov-2 infection and should not be used as the sole basis for treatment or other patient management decisions. A negative result may occur with  improper specimen collection/handling, submission of specimen  other than nasopharyngeal swab, presence of viral mutation(s) within the areas targeted by this assay, and inadequate number of viral copies(<138 copies/mL). A negative result must be combined with clinical observations, patient history, and epidemiological information. The expected result is Negative.  Fact Sheet for Patients:  EntrepreneurPulse.com.au  Fact Sheet for Healthcare Providers:  IncredibleEmployment.be  This test is no t yet approved or cleared by the [place] FDA and  has been authorized for detection and/or diagnosis of SARS-CoV-2 by FDA under an Emergency Use Authorization (EUA). This EUA will remain  in effect (meaning this test can be used) for the duration of the COVID-19 declaration under Section 564(b)(1) of the Act, 21 U.S.C.section 360bbb-3(b)(1), unless the authorization is terminated  or revoked sooner.       Influenza A by PCR NEGATIVE NEGATIVE Final   Influenza B by PCR NEGATIVE NEGATIVE Final    Comment: (NOTE) The Xpert Xpress SARS-CoV-2/FLU/RSV plus assay is intended as an aid in the diagnosis of influenza from Nasopharyngeal swab specimens and should not be used as a sole basis for treatment. Nasal washings and aspirates are unacceptable for Xpert Xpress SARS-CoV-2/FLU/RSV testing.  Fact Sheet for Patients: EntrepreneurPulse.com.au  Fact Sheet for Healthcare Providers: IncredibleEmployment.be  This test is not yet approved or cleared by the [place] FDA and has been authorized for detection and/or diagnosis of SARS-CoV-2 by FDA under an Emergency Use Authorization (EUA). This EUA will remain in effect (meaning this test can be used) for the duration of the COVID-19 declaration under Section 564(b)(1) of the Act, 21 U.S.C. section 360bbb-3(b)(1), unless the authorization is terminated or revoked.  Performed at [facility], [address], [place], [place] [address]      Radiological Exams on Admission: CT Angio Chest PE W and/or Wo Contrast  Result Date: 10/29/2022 CLINICAL DATA:  Pulmonary embolism (PE) suspected, low to intermediate prob, positive D-dimer EXAM: CT ANGIOGRAPHY CHEST WITH CONTRAST TECHNIQUE: Multidetector CT imaging of the chest was performed using the standard protocol during bolus administration of intravenous contrast. Multiplanar CT image reconstructions and MIPs were obtained to evaluate the vascular anatomy. RADIATION DOSE REDUCTION: This exam was performed according to the departmental dose-optimization program which includes automated exposure control, adjustment of the mA and/or  kV according to patient size and/or use of iterative reconstruction technique. CONTRAST:  61m OMNIPAQUE IOHEXOL 350 MG/ML SOLN COMPARISON:  None Available. FINDINGS: Cardiovascular: Heart is normal size. Aorta is normal caliber. Scattered coronary artery and aortic calcifications. No filling defects in the pulmonary arteries to suggest pulmonary emboli. Mediastinum/Nodes: Mildly prominent mediastinal and bilateral hilar lymph nodes. No axillary adenopathy. Trachea and esophagus are unremarkable. Lungs/Pleura: Moderate right pleural effusion. Airspace disease in the right lower lobe compatible with pneumonia. Clustered tree-in-bud nodular densities in the right upper lobe also likely infectious. No confluent opacity on the left. Upper Abdomen: No acute findings Musculoskeletal: Chest wall soft tissues are unremarkable. No acute bony abnormality. Review of the MIP images confirms the above findings. IMPRESSION: Right lower lobe consolidation with moderate right pleural effusion. Tree-in-bud nodular densities in the right upper lobe. Findings compatible with pneumonia. No evidence of pulmonary embolus. Coronary artery disease. Aortic Atherosclerosis (ICD10-I70.0). Electronically Signed   By: KRolm BaptiseM.D.   On: 10/29/2022 23:43   DG  Chest Portable 1 View  Result Date: 10/29/2022 CLINICAL DATA:  Atrial fibrillation, palpitations EXAM: PORTABLE CHEST 1 VIEW COMPARISON:  10/23/2022 FINDINGS: Transverse diameter of heart is slightly increased. There are no signs of alveolar pulmonary edema. There is interval decrease in patchy infiltrates in right parahilar region and right lower lung field. New small patchy infiltrate is seen in the lateral aspect of left lower lung field. There is no pleural effusion or pneumothorax. IMPRESSION: There is interval decrease in patchy infiltrates in right mid and right lower lung fields suggesting resolving atelectasis/pneumonia. There is new small patchy infiltrate in left lower lung fields suggesting multifocal pneumonia. Electronically Signed   By: PElmer PickerM.D.   On: 10/29/2022 16:37    Assessment/Plan Present on Admission:  Atrial fibrillation with RVR (HNulato -Admit to med telemetry -CHA2DS2-VASc 3, patient placed on IV heparin, continued -Patient is on IV Cardizem, continued -2D echo ordered, TSH ordered -Cardiology consult placed  Community-acquired pneumonia -Pneumonia order set initiated -Urine Legionella and Streptococcus ordered -IV Rocephin and azithromycin ordered -Robitussin as needed -Oxygen if needed -Nebulizers as needed  Hypothyroidism -Synthroid resumed  GERD -   Dehlia Kilner 10/30/2022, 2:23 AM

## 2022-10-30 NOTE — Progress Notes (Signed)
Echocardiogram 2D Echocardiogram has been performed.  Oneal Deputy Eliane Hammersmith RDCS 10/30/2022, 10:04 AM

## 2022-10-30 NOTE — Progress Notes (Addendum)
PROGRESS NOTE    Leslie [name]  JEH:[id number] DOB: 1941-06-02 DOA: 10/29/2022 PCP: Sonia Side., FNP  Chief Complaint  Patient presents with   new onset atrial fib    Brief Narrative:  This is Leslie [name] 81 year old female with past medical history of hypothyroidism and GERD.  In October, the patient had Maurice Fotheringham series of unfortunate events.  Her bathroom blocked up, [employer] came and tore her floors up.  The smells irritates her respiratory passages and caused nausea and vomiting.  She went on to develop Dorsel Flinn hacking nonproductive cough.  She saw her PCP, he started on 2 antibiotics and p.o. steroids.  She states she did not get better.  Additionally, she developed palpitations that were worse at night.  She was short of breath, and had Annie Roseboom mild wheeze.  She denies fever, chills.  She endorse some lightheadedness and dizziness and was very weak.  She had no appetite..  She returned to her PCP today where EKG was done revealing Pierrette Scheu-fib with RVR.  The patient was directed to the ER.  Patient has no prior history of atrial fibrillation.   Patient transferred from freestanding ER.  History provided by the patient  Assessment & Plan:   Principal Problem:   Atrial fibrillation with RVR ([name])  Atrial fibrillation with RVR New onset in the setting of pneumonia Chadsvasc 4 Currently on heparin gtt metoprolol per cards TSH wnl Echo with mildly decreased EF 40-45%, moderate asymmetric LVH, moderate to severe MVR Appreciate cardiology assistance  Community Acquired Pneumonia  Right Sided Pleural Effusion She was treated outpatient with courses of cefdinir and azithromycin CT showing R lower lobe consolidation with Meleah Demeyer moderate right pleural effusion, tree in bud nodular densities in the right upper lobe Will consult pulmonology to eval for thoracentesis Currently on azithromycin and ceftriaxone Follow MRSA PCR, sputum cx if able, urine strep, urine legionella Blood cultures pending  HFmrEF   Mitral Valve Regurgitation Appears euvolemic Appreciate cardiology recommendations  Prediabetes Outpatient follow up  Hypothyroidism Synthroid  GERD Continue pepcid    DVT prophylaxis: heparin gtt Code Status: full Family Communication: none Disposition:   Status is: Inpatient Remains inpatient appropriate because: awaiting cardiology eval, pulmonology c/s for thora   Consultants:  Cardiology pulmonology  Procedures:  Echo IMPRESSIONS     1. Left ventricular ejection fraction, by estimation, is 40 to 45%. The  left ventricle has mildly decreased function. The left ventricle  demonstrates global hypokinesis. There is moderate asymmetric left  ventricular hypertrophy of the basal-septal  segment. Left ventricular diastolic parameters are indeterminate.   2. Right ventricular systolic function is normal. The right ventricular  size is normal. There is normal pulmonary artery systolic pressure.   3. Left atrial size was mildly dilated.   4. The mitral valve is abnormal. Moderate to severe mitral valve  regurgitation. No evidence of mitral stenosis.   5. The aortic valve was not well visualized. Aortic valve regurgitation  is trivial. No aortic stenosis is present.   6. The inferior vena cava is dilated in size with >50% respiratory  variability, suggesting right atrial pressure of 8 mmHg.   Comparison(s): MR is worse from prior report.   Antimicrobials:  Anti-infectives (From admission, onward)    Start     Dose/Rate Route Frequency Ordered Stop   10/30/22 1000  cefTRIAXone (ROCEPHIN) 2 g in sodium chloride 0.9 % 100 mL IVPB        2 g 200 mL/hr over 30 Minutes Intravenous Every 24  hours 10/30/22 0222 11/04/22 0959   10/30/22 1000  azithromycin (ZITHROMAX) tablet 500 mg        500 mg Oral Daily 10/30/22 0222 11/04/22 0959   10/29/22 1715  cefTRIAXone (ROCEPHIN) 1 g in sodium chloride 0.9 % 100 mL IVPB        1 g 200 mL/hr over 30 Minutes Intravenous  Once  10/29/22 1705 10/29/22 1910   10/29/22 1715  azithromycin (ZITHROMAX) 500 mg in sodium chloride 0.9 % 250 mL IVPB        500 mg 250 mL/hr over 60 Minutes Intravenous  Once 10/29/22 1705 10/29/22 2022       Subjective: No new complaints  Objective: Vitals:   10/30/22 0028 10/30/22 0505 10/30/22 0735 10/30/22 1156  BP: (!) 120/90 110/78 (!) 119/92 (!) 142/99  Pulse: 73 99 98 (!) 105  Resp: 20 (!) '21 15 18  '$ Temp: (!) 97.5 F (36.4 C) 97.6 F (36.4 C) 97.8 F (36.6 C)   TempSrc: Oral Oral Oral   SpO2: 96% 96% 97% 97%  Weight:      Height:        Intake/Output Summary (Last 24 hours) at 10/30/2022 1400 Last data filed at 10/30/2022 1300 Gross per 24 hour  Intake 120 ml  Output --  Net 120 ml   Filed Weights   10/29/22 1547 10/30/22 0016  Weight: 78 kg 77.8 kg    Examination:  General exam: Appears calm and comfortable  Respiratory system: diminished to R base, unlabored Cardiovascular system: irregularly irregular, tachycardic Gastrointestinal system: Abdomen is nondistended, soft and nontender Central nervous system: Alert and oriented. No focal neurological deficits. Extremities: no LEE   Data Reviewed: I have personally reviewed following labs and imaging studies  CBC: Recent Labs  Lab 10/29/22 1552 10/30/22 0036  WBC 13.3* 10.8*  NEUTROABS 8.8*  --   HGB 10.5* 10.6*  HCT 33.9* 33.0*  MCV 89.9 87.8  PLT 333 884    Basic Metabolic Panel: Recent Labs  Lab 10/29/22 1552 10/30/22 0354  NA 141 140  K 4.0 5.1  CL 110 111  CO2 20* 18*  GLUCOSE 123* 179*  BUN 24* 22  CREATININE 1.03* 0.98  CALCIUM 9.1 9.0  MG 2.2  --     GFR: Estimated Creatinine Clearance: 49.7 mL/min (by C-G formula based on SCr of 0.98 mg/dL).  Liver Function Tests: No results for input(s): "AST", "ALT", "ALKPHOS", "BILITOT", "PROT", "ALBUMIN" in the last 168 hours.  CBG: No results for input(s): "GLUCAP" in the last 168 hours.   Recent Results (from the past 240  hour(s))  Resp Panel by RT-PCR (Flu Paisleigh Maroney&B, Covid) Anterior Nasal Swab     Status: None   Collection Time: 10/29/22  4:36 PM   Specimen: Anterior Nasal Swab  Result Value Ref Range Status   SARS Coronavirus 2 by RT PCR NEGATIVE NEGATIVE Final    Comment: (NOTE) SARS-CoV-2 target nucleic acids are NOT DETECTED.  The SARS-CoV-2 RNA is generally detectable in upper respiratory specimens during the acute phase of infection. The lowest concentration of SARS-CoV-2 viral copies this assay can detect is 138 copies/mL. Ashwini Jago negative result does not preclude SARS-Cov-2 infection and should not be used as the sole basis for treatment or other patient management decisions. Kaynen Minner negative result may occur with  improper specimen collection/handling, submission of specimen other than nasopharyngeal swab, presence of viral mutation(s) within the areas targeted by this assay, and inadequate number of viral copies(<138 copies/mL). Bridie Colquhoun negative result must  be combined with clinical observations, patient history, and epidemiological information. The expected result is Negative.  Fact Sheet for Patients:  EntrepreneurPulse.com.au  Fact Sheet for Healthcare Providers:  IncredibleEmployment.be  This test is no t yet approved or cleared by the [place] FDA and  has been authorized for detection and/or diagnosis of SARS-CoV-2 by FDA under an Emergency Use Authorization (EUA). This EUA will remain  in effect (meaning this test can be used) for the duration of the COVID-19 declaration under Section 564(b)(1) of the Act, 21 U.S.C.section 360bbb-3(b)(1), unless the authorization is terminated  or revoked sooner.       Influenza Abel Ra by PCR NEGATIVE NEGATIVE Final   Influenza B by PCR NEGATIVE NEGATIVE Final    Comment: (NOTE) The Xpert Xpress SARS-CoV-2/FLU/RSV plus assay is intended as an aid in the diagnosis of influenza from Nasopharyngeal swab specimens and should not be  used as Nickalus Thornsberry sole basis for treatment. Nasal washings and aspirates are unacceptable for Xpert Xpress SARS-CoV-2/FLU/RSV testing.  Fact Sheet for Patients: EntrepreneurPulse.com.au  Fact Sheet for Healthcare Providers: IncredibleEmployment.be  This test is not yet approved or cleared by the [place] FDA and has been authorized for detection and/or diagnosis of SARS-CoV-2 by FDA under an Emergency Use Authorization (EUA). This EUA will remain in effect (meaning this test can be used) for the duration of the COVID-19 declaration under Section 564(b)(1) of the Act, 21 U.S.C. section 360bbb-3(b)(1), unless the authorization is terminated or revoked.  Performed at [facility], [address], [place], [place] [address]   Blood culture (routine x 2)     Status: None (Preliminary result)   Collection Time: 10/29/22  5:30 PM   Specimen: BLOOD  Result Value Ref Range Status   Specimen Description   Final    BLOOD RIGHT ANTECUBITAL Performed at [facility], [address] [place], [place] [address]    Special Requests   Final    Blood Culture adequate volume BOTTLES DRAWN AEROBIC AND ANAEROBIC Performed at [facility], [address] [place], [place] [address]    Culture   Final    NO GROWTH < 12 HOURS Performed at [facility], [place] [address], [place], [place] [address]    Report Status PENDING  Incomplete  Blood culture (routine x 2)     Status: None (Preliminary result)   Collection Time: 10/29/22  6:10 PM   Specimen: BLOOD  Result Value Ref Range Status   Specimen Description   Final    BLOOD BLOOD RIGHT FOREARM Performed at [facility], [address], [place], [place] [address]    Special Requests   Final    Blood Culture adequate volume BOTTLES DRAWN AEROBIC ONLY Performed at [facility], [address], [place], [place]  [address]    Culture   Final    NO GROWTH < 12 HOURS Performed at [facility] [address] [place], [place] [address]    Report Status PENDING  Incomplete         Radiology Studies: ECHOCARDIOGRAM COMPLETE  Result Date: 10/30/2022    ECHOCARDIOGRAM REPORT   Patient Name:   [name] Date of Exam: 10/30/2022 Medical Rec #:  [id number]           Height:       67.5 in Accession #:    [id number]          Weight:       171.5 lb  Date of Birth:  [birth year]           BSA:          1.905 m Patient Age:    27 years            BP:           119/92 mmHg Patient Gender: F                   HR:           103 bpm. Exam Location:  Inpatient Procedure: 2D Echo, Color Doppler and Cardiac Doppler Indications:    I48.91* Unspecified atrial fibrillation  History:        Patient has prior history of Echocardiogram examinations, most                 recent 01/10/2016. Arrythmias:Atrial Fibrillation.  Sonographer:    Raquel Sarna Senior RDCS Referring Phys: [phone] Jhonatan Lomeli CALDWELL [place]  1. Left ventricular ejection fraction, by estimation, is 40 to 45%. The left ventricle has mildly decreased function. The left ventricle demonstrates global hypokinesis. There is moderate asymmetric left ventricular hypertrophy of the basal-septal segment. Left ventricular diastolic parameters are indeterminate.  2. Right ventricular systolic function is normal. The right ventricular size is normal. There is normal pulmonary artery systolic pressure.  3. Left atrial size was mildly dilated.  4. The mitral valve is abnormal. 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 No evidence of mitral stenosis.  5. The aortic valve was not well visualized. Aortic valve regurgitation is trivial. No aortic stenosis is present.  6. The inferior vena cava is dilated in size with >50% respiratory variability, suggesting right atrial pressure of 8 mmHg. Comparison(s): MR is worse from prior report. FINDINGS  Left Ventricle: Left  ventricular ejection fraction, by estimation, is 40 to 45%. The left ventricle has mildly decreased function. The left ventricle demonstrates global hypokinesis. The left ventricular internal cavity size was normal in size. There is  moderate asymmetric left ventricular hypertrophy of the basal-septal segment. Left ventricular diastolic parameters are indeterminate. Right Ventricle: The right ventricular size is normal. No increase in right ventricular wall thickness. Right ventricular systolic function is normal. There is normal pulmonary artery systolic pressure. The tricuspid regurgitant velocity is 2.72 m/s, and  with an assumed right atrial pressure of 3 mmHg, the estimated right ventricular systolic pressure is 38.2 mmHg. Left Atrium: Left atrial size was mildly dilated. Right Atrium: Right atrial size was normal in size. Pericardium: The pericardium was not well visualized. Mitral Valve: MR volume 52. The mitral valve is abnormal. Mild mitral annular calcification. 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 No evidence of mitral valve stenosis. Tricuspid Valve: The tricuspid valve is not well visualized. Tricuspid valve regurgitation is not demonstrated. No evidence of tricuspid stenosis. Aortic Valve: The aortic valve was not well visualized. Aortic valve regurgitation is trivial. No aortic stenosis is present. Pulmonic Valve: The pulmonic valve was not well visualized. Pulmonic valve regurgitation is not visualized. No evidence of pulmonic stenosis. Aorta: The aortic root, ascending aorta and aortic arch are all structurally normal, with no evidence of dilitation or obstruction. Venous: The inferior vena cava is dilated in size with greater than 50% respiratory variability, suggesting right atrial pressure of 8 mmHg. IAS/Shunts: No atrial level shunt detected by color flow Doppler.  LEFT VENTRICLE PLAX 2D LVIDd:         4.20 cm LVIDs:         3.50 cm  LV PW:         0.90 cm LV IVS:        1.25 cm LVOT  diam:     2.10 cm LV SV:         40 LV SV Index:   21 LVOT Area:     3.46 cm  RIGHT VENTRICLE RV S prime:     10.30 cm/s TAPSE (M-mode): 1.3 cm LEFT ATRIUM             Index        RIGHT ATRIUM           Index LA diam:        3.80 cm 1.99 cm/m   RA Area:     13.50 cm LA Vol (A2C):   65.3 ml 34.28 ml/m  RA Volume:   31.30 ml  16.43 ml/m LA Vol (A4C):   76.4 ml 40.11 ml/m LA Biplane Vol: 75.7 ml 39.74 ml/m  AORTIC VALVE LVOT Vmax:   74.00 cm/s LVOT Vmean:  48.700 cm/s LVOT VTI:    0.116 m  AORTA Ao Root diam: 3.30 cm Ao Asc diam:  3.10 cm MR Peak grad:    84.6 mmHg    TRICUSPID VALVE MR Mean grad:    60.0 mmHg    TR Peak grad:   29.6 mmHg MR Vmax:         460.00 cm/s  TR Vmax:        272.00 cm/s MR Vmean:        370.5 cm/s MR PISA:         4.02 cm     SHUNTS MR PISA Eff ROA: 28 mm       Systemic VTI:  0.12 m MR PISA Radius:  0.80 cm      Systemic Diam: 2.10 cm Rudean Haskell MD Electronically signed by Rudean Haskell MD Signature Date/Time: 10/30/2022/10:54:26 AM    Final    CT Angio Chest PE W and/or Wo Contrast  Result Date: 10/29/2022 CLINICAL DATA:  Pulmonary embolism (PE) suspected, low to intermediate prob, positive D-dimer EXAM: CT ANGIOGRAPHY CHEST WITH CONTRAST TECHNIQUE: Multidetector CT imaging of the chest was performed using the standard protocol during bolus administration of intravenous contrast. Multiplanar CT image reconstructions and MIPs were obtained to evaluate the vascular anatomy. RADIATION DOSE REDUCTION: This exam was performed according to the departmental dose-optimization program which includes automated exposure control, adjustment of the mA and/or kV according to patient size and/or use of iterative reconstruction technique. CONTRAST:  3m OMNIPAQUE IOHEXOL 350 MG/ML SOLN COMPARISON:  None Available. FINDINGS: Cardiovascular: Heart is normal size. Aorta is normal caliber. Scattered coronary artery and aortic calcifications. No filling defects in the pulmonary  arteries to suggest pulmonary emboli. Mediastinum/Nodes: Mildly prominent mediastinal and bilateral hilar lymph nodes. No axillary adenopathy. Trachea and esophagus are unremarkable. Lungs/Pleura: Moderate right pleural effusion. Airspace disease in the right lower lobe compatible with pneumonia. Clustered tree-in-bud nodular densities in the right upper lobe also likely infectious. No confluent opacity on the left. Upper Abdomen: No acute findings Musculoskeletal: Chest wall soft tissues are unremarkable. No acute bony abnormality. Review of the MIP images confirms the above findings. IMPRESSION: Right lower lobe consolidation with moderate right pleural effusion. Tree-in-bud nodular densities in the right upper lobe. Findings compatible with pneumonia. No evidence of pulmonary embolus. Coronary artery disease. Aortic Atherosclerosis (ICD10-I70.0). Electronically Signed   By: KRolm BaptiseM.D.   On: 10/29/2022 23:43   DG Chest Portable 1  View  Result Date: 10/29/2022 CLINICAL DATA:  Atrial fibrillation, palpitations EXAM: PORTABLE CHEST 1 VIEW COMPARISON:  10/23/2022 FINDINGS: Transverse diameter of heart is slightly increased. There are no signs of alveolar pulmonary edema. There is interval decrease in patchy infiltrates in right parahilar region and right lower lung field. New small patchy infiltrate is seen in the lateral aspect of left lower lung field. There is no pleural effusion or pneumothorax. IMPRESSION: There is interval decrease in patchy infiltrates in right mid and right lower lung fields suggesting resolving atelectasis/pneumonia. There is new small patchy infiltrate in left lower lung fields suggesting multifocal pneumonia. Electronically Signed   By: Elmer Picker [name]   On: 10/29/2022 16:37        Scheduled Meds:  azithromycin  500 mg Oral Daily   Continuous Infusions:  cefTRIAXone (ROCEPHIN)  IV 2 g (10/30/22 0904)   diltiazem (CARDIZEM) infusion 15 mg/hr (10/30/22 1038)    heparin Stopped (10/30/22 1144)     LOS: 0 days    Time spent: over 30 min    Fayrene Helper, MD [facility]   To contact the attending provider between 7A-7P or the covering provider during after hours 7P-7A, please log into the web site [email or web address] and access using universal [place] password for that web site. If you do not have the password, please call the hospital operator.  10/30/2022, 2:00 PM

## 2022-10-30 NOTE — Consult Note (Signed)
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  She noted she had vomiting following the sewage backup approximately 3 days after the blockage occurred in late October 2023.  She also noted night sweats.  She was evaluated by her primary care physician found to have a right-sided pneumonia was treated with oral antibiotics.  She went back to her primary care and was found to be in atrial fibrillation ventricular response and was admitted to the hospital for heparin and diltiazem drip.  CT scan of chest revealed right pleural effusion pulmonary critical care was asked to evaluate.  On ultrasound evaluation she was found to have a free-flowing right pleural effusion.  Plan is for thoracentesis today.  Pertinent  Medical History   Past Medical History:  Diagnosis Date   Arthritis    Chronic kidney disease    "stage 3 kidney failure" improving per pt   Headache    Hypothyroidism    Insomnia    Plantar fasciitis    Stomach ulcer ~2000   Stroke [facility])    TIA X2   2001, jan 2017   Vitamin D deficiency      Significant Hospital Events: Including procedures, antibiotic start and stop dates in addition to other pertinent events    Plan thoracentesis 3 PM 10/30/2022  Interim History / Subjective:  81 year old female with refractory pneumonia now with right-sided pleural effusion.   Objective   Blood pressure (!) 119/92, pulse 98, temperature 97.8 F (36.6 C), temperature source Oral, resp. rate 15, height 5' 7.5" (1.715 m), weight 77.8 kg, SpO2 97 %.       No intake or output data in the 24 hours ending 10/30/22 1159 Filed Weights   10/29/22 1547 10/30/22 0016  Weight: 78 kg 77.8 kg    Examination: General: Awake  alert very bright 81 year old HENT: No JVD or lymphadenopathy is appreciated Lungs: Decreased breath sounds on percussion right side Cardiovascular: Heart sounds are irregular with controlled ventricular rate of 90 Abdomen: Soft nontender positive bowel sounds Extremities: Warm and dry Neuro: Grossly intact without focal defect GU: Voids    Resolved Hospital Problem list     Assessment & Plan:  Refractory pneumonia with right pleural effusion free-flowing. Plan for thoracentesis 3 PM 10/30/2022 Heparin drip was stopped at 1130 10/30/2022 Endoscopy aware Continue antimicrobial therapy May need expanded coverage due to refractory nature of pneumonia Cytology of pleural fluid Culture of pleural pleural fluid  New onset atrial fibrillation ventricular response Currently on heparin and diltiazem the heparin was stopped at 11:30 AM Per primary  Best Practice (right click and "Reselect all SmartList Selections" daily)   Diet/type: Regular consistency (see orders) DVT prophylaxis: systemic heparin GI prophylaxis: PPI Lines: N/A Foley:  N/A Code Status:  full code Last date of multidisciplinary goals of care discussion [tbd]  Labs   CBC: Recent Labs  Lab 10/29/22 1552 10/30/22 0036  WBC 13.3* 10.8*  NEUTROABS 8.8*  --   HGB 10.5* 10.6*  HCT 33.9* 33.0*  MCV 89.9 87.8  PLT 333 962    Basic Metabolic Panel: Recent Labs  Lab 10/29/22 1552 10/30/22 0354  NA 141 140  K 4.0 5.1  CL 110 111  CO2 20* 18*  GLUCOSE 123* 179*  BUN  24* 22  CREATININE 1.03* 0.98  CALCIUM 9.1 9.0  MG 2.2  --    GFR: Estimated Creatinine Clearance: 49.7 mL/min (by C-G formula based on SCr of 0.98 mg/dL). Recent Labs  Lab 10/29/22 1552 10/30/22 0036  WBC 13.3* 10.8*    Liver Function Tests: No results for input(s): "AST", "ALT", "ALKPHOS", "BILITOT", "PROT", "ALBUMIN" in the last 168 hours. No results for input(s): "LIPASE", "AMYLASE" in the last 168 hours. No results for  input(s): "AMMONIA" in the last 168 hours.  ABG No results found for: "PHART", "PCO2ART", "PO2ART", "HCO3", "TCO2", "ACIDBASEDEF", "O2SAT"   Coagulation Profile: No results for input(s): "INR", "PROTIME" in the last 168 hours.  Cardiac Enzymes: No results for input(s): "CKTOTAL", "CKMB", "CKMBINDEX", "TROPONINI" in the last 168 hours.  HbA1C: Hgb A1c MFr Bld  Date/Time Value Ref Range Status  10/30/2022 12:36 AM 6.2 (H) 4.8 - 5.6 % Final    Comment:    (NOTE) Pre diabetes:          5.7%-6.4%  Diabetes:              >6.4%  Glycemic control for   <7.0% adults with diabetes     CBG: No results for input(s): "GLUCAP" in the last 168 hours.  Review of Systems:   10 point review of system taken, please see HPI for positives and negatives. Positive for night sweats Positive for allergic reactions to mold and mildew  Past Medical History:  She,  has a past medical history of Arthritis, Chronic kidney disease, Headache, Hypothyroidism, Insomnia, Plantar fasciitis, Stomach ulcer (~2000), Stroke ([place]), and Vitamin D deficiency.   Surgical History:   Past Surgical History:  Procedure Laterality Date   ABDOMINAL HYSTERECTOMY  1974   ANKLE FUSION Right 06/07/2016   Procedure: RIGHT TALONAVICULAR ARTHRODESIS ;  Surgeon: Wylene Simmer, MD;  Location: [place];  Service: Orthopedics;  Laterality: Right;   APPENDECTOMY     BACK SURGERY     x4  fusion T12 thru sacrum; U rod in sacrum   CATARACT EXTRACTION, BILATERAL Bilateral 2015   GASTROCNEMIUS RECESSION Right 06/07/2016   Procedure: RIGHT GASTROC RECESSION ;  Surgeon: Wylene Simmer, MD;  Location: [place];  Service: Orthopedics;  Laterality: Right;   HAND SURGERY Left 2003   x2   L heel surgery for torn tendon Left 2015   PLANTAR FASCIA SURGERY Right 2009   surgery at [place] Right 2007   TENDON REPAIR Right 1949   foot- lacerated tendon in arch of foot   TOTAL HIP  ARTHROPLASTY Left 10/05/2021   Procedure: TOTAL HIP ARTHROPLASTY ANTERIOR APPROACH;  Surgeon: Rod Can, MD;  Location: [facility];  Service: Orthopedics;  Laterality: Left;     Social History:   reports that she quit smoking about 36 years ago. Her smoking use included cigarettes. She has never used smokeless tobacco. She reports that she does not drink alcohol and does not use drugs.   Family History:  Her family history includes Arthritis in her brother; Congestive Heart Failure in her sister; Diabetes in her sister; Hypertension in her mother and sister; Lung cancer in her father and mother.   Allergies Allergies  Allergen Reactions   Duloxetine Nausea Only   Voltaren [Diclofenac Sodium] Shortness Of Breath and Palpitations   Aspirin Other (See Comments)    ulcers   Gabapentin Itching   Ultram [Tramadol Hcl] Other (See Comments)    Effects cognitive abilities  Proton Pump Inhibitors Rash    Itching, rash     Home Medications  Prior to Admission medications   Medication Sig Start Date End Date Taking? Authorizing Provider  acetaminophen (TYLENOL) 325 MG tablet Take 1-2 tablets (325-650 mg total) by mouth every 6 (six) hours as needed for mild pain (pain score 1-3 or temp > 100.5). 10/06/21   Dorothyann Peng, PA-C  apixaban (ELIQUIS) 2.5 MG TABS tablet Take 1 tablet (2.5 mg total) by mouth 2 (two) times daily. 10/06/21 11/05/21  Dorothyann Peng, PA-C  Cholecalciferol (VITAMIN D) 50 MCG (2000 UT) tablet Take 2,000 Units by mouth daily.    [provider]  COLLAGEN PO Take 12 g by mouth daily.    [provider]  docusate sodium (COLACE) 100 MG capsule Take 1 capsule (100 mg total) by mouth 2 (two) times daily. 10/06/21   Dorothyann Peng, PA-C  famotidine (PEPCID) 20 MG tablet Take 20 mg by mouth 2 (two) times daily.    [provider]  Humidifier/Vaporizer Supplies [facility] VAPORIZER SCENT) PADS Inhale 1 Dose into the lungs daily as needed  (congestion).    [provider]  HYDROcodone-acetaminophen (NORCO) 7.5-325 MG tablet Take 1-2 tablets by mouth every 4 (four) hours as needed for severe pain (pain score 7-10). 10/06/21   Dorothyann Peng, PA-C  levothyroxine (SYNTHROID) 88 MCG tablet Take 88 mcg by mouth daily before breakfast.    [provider]  ondansetron (ZOFRAN) 4 MG tablet Take 1 tablet (4 mg total) by mouth every 6 (six) hours as needed for nausea. 10/06/21   Dorothyann Peng, PA-C  Polyethyl Glycol-Propyl Glycol (SYSTANE OP) Place 1 drop into both eyes daily as needed (dry eyes).    [provider]  senna (SENOKOT) 8.6 MG TABS tablet Take 1 tablet (8.6 mg total) by mouth 2 (two) times daily. 10/06/21   Dorothyann Peng, PA-C     Critical care time: Ferol Luz Shonice Wrisley ACNP Acute Care Nurse Practitioner [facility] Please consult Amion 10/30/2022, 11:59 AM

## 2022-10-30 NOTE — Progress Notes (Signed)
ANTICOAGULATION CONSULT NOTE  Pharmacy Consult for heparin infusion  Indication: atrial fibrillation  Allergies  Allergen Reactions   Duloxetine Nausea Only   Voltaren [Diclofenac Sodium] Shortness Of Breath and Palpitations   Aspirin Other (See Comments)    ulcers   Gabapentin Itching   Ultram [Tramadol Hcl] Other (See Comments)    Effects cognitive abilities    Proton Pump Inhibitors Rash    Itching, rash    Patient Measurements: Height: 5' 7.5" (171.5 cm) Weight: 77.8 kg (171 lb 8.3 oz) IBW/kg (Calculated) : 62.75 Heparin Dosing Weight: 77.3kg   Vital Signs: Temp: 97.5 F (36.4 C) (11/21 0028) Temp Source: Oral (11/21 0028) BP: 120/90 (11/21 0028) Pulse Rate: 73 (11/21 0028)  Labs: Recent Labs    10/29/22 1552 10/29/22 2018 10/30/22 0036  HGB 10.5*  --  10.6*  HCT 33.9*  --  33.0*  PLT 333  --  298  HEPARINUNFRC  --   --  0.39  CREATININE 1.03*  --   --   TROPONINIHS 13 11  --      Estimated Creatinine Clearance: 47.3 mL/min (A) (by C-G formula based on SCr of 1.03 mg/dL (H)).   Medical History: Past Medical History:  Diagnosis Date   Arthritis    Chronic kidney disease    "stage 3 kidney failure" improving per pt   Headache    Hypothyroidism    Insomnia    Plantar fasciitis    Stomach ulcer ~2000   Stroke [facility])    TIA X2   2001, jan 2017   Vitamin D deficiency     Assessment: 81 yo F with new afib with RVR  -  no anticoagulants on recent fills. Was previously on eliquis 2.5 mg BID in 2022.   Hgb 10.5/Plt 333 - stable   11/21 AM update:  Heparin level therapeutic   Goal of Therapy:  Heparin level 0.3-0.7 units/ml Monitor platelets by anticoagulation protocol: Yes   Plan:  Cont heparin 1000 units/hr 1000 heparin level  Narda Bonds, PharmD, BCPS Clinical Pharmacist Phone: [phone]

## 2022-10-30 NOTE — Plan of Care (Signed)

## 2022-10-30 NOTE — Procedures (Signed)
Thoracentesis  Procedure Note  Nahomy Limburg  [id number]  May 09, 1941  Date:10/30/22  Time:3:29 PM   Provider Performing:Shantale Holtmeyer Naomie Dean   Procedure: Thoracentesis with imaging guidance ([id number])  Indication(s) Pleural Effusion  Consent Risks of the procedure as well as the alternatives and risks of each were explained to the patient and/or caregiver.  Consent for the procedure was obtained and is signed in the bedside chart  Anesthesia Topical only with 1% lidocaine    Time Out Verified patient identification, verified procedure, site/side was marked, verified correct patient position, special equipment/implants available, medications/allergies/relevant history reviewed, required imaging and test results available.   Sterile Technique Maximal sterile technique including full sterile barrier drape, hand hygiene, sterile gown, sterile gloves, mask, hair covering, sterile ultrasound probe cover (if used).  Procedure Description Ultrasound was used to identify appropriate pleural anatomy for placement and overlying skin marked.  Area of drainage cleaned and draped in sterile fashion. Lidocaine was used to anesthetize the skin and subcutaneous tissue.  500 cc's of yellow clear appearing fluid was drained from the right pleural space. Catheter then removed and bandaid applied to site.   Complications/Tolerance None; patient tolerated the procedure well. Chest X-ray is ordered to confirm no post-procedural complication.   EBL Minimal   Specimen(s) Pleural fluid- cyto, culture, cell count with diff, protein, LDH, glucose   Julian Hy, DO 10/30/22 3:30 PM [facility]

## 2022-10-30 NOTE — Consult Note (Signed)
Cardiology Consultation   Patient ID: Leslie [name] MRN: [id number]; DOB: 01-17-1941  Admit date: 10/29/2022 Date of Consult: 10/30/2022  PCP:  Sonia Side[facility] [place] Providers Cardiologist:  [facility] (Dr. Martinique)  Patient Profile:   Leslie [name] is a 81 y.o. female with a hx of hypothyroidism, GERD, TIA who is being seen 10/30/2022 for the evaluation of new onset afib at the request of Dr. Florene Glen.  History of Present Illness:   Leslie [name] is an 81 year old female with past medical history noted above.  She has never been evaluated by cardiology in the past.  Reports having a back up in her septic system and water damage in her house several weeks ago. Developed a cough with nausea/vomiting shortly after that time. She was seen in her PCP office with complaints of cough, found to have PNA and treated with Zithromax, cefdinir and steroids.  Unfortunately did not have significant improvement and return to her PCP office on 11/20 and found to have new onset atrial fibrillation with RVR and directed to the ED for further evaluation.  In the ED her labs showed sodium 141, potassium 4, creatinine 1.03, high-sensitivity troponin 13, WBC 13.3, hemoglobin 10.5.  Chest x-ray with right mid and lower lung field infiltrate suggestive of atelectasis versus pneumonia with patchy infiltrates in left lower lung field suggestive of multifocal pneumonia.  EKG showed atrial fibrillation with RVR, 153 bpm.  She was admitted to internal medicine service and cardiology asked to evaluate.  Placed on IV Rocephin as well as azithromycin.  Also started on diltiazem drip as well as heparin infusion.  [facility]red to [facility].  D-dimer was noted to be elevated at 2.37.  CT angio was negative for PE but revealed right lower lobe consolidation with moderate right pleural effusion compatible with pneumonia.  The CCM was asked to evaluate.  On bedside  ultrasound she was found to have a free-flowing right pleural effusion with plan for thoracentesis.  Echocardiogram 11/21 showed LVEF of 40 to 45%, global hypokinesis, moderate asymmetric LVH of the basal-septal segment, normal RV size and function, mildly dilated left atrium, moderate to severe MR.   Past Medical History:  Diagnosis Date   Arthritis    Chronic kidney disease    "stage 3 kidney failure" improving per pt   Headache    Hypothyroidism    Insomnia    Plantar fasciitis    Stomach ulcer ~2000   Stroke [facility])    TIA X2   2001, jan 2017   Vitamin D deficiency     Past Surgical History:  Procedure Laterality Date   ABDOMINAL HYSTERECTOMY  1974   ANKLE FUSION Right 06/07/2016   Procedure: RIGHT TALONAVICULAR ARTHRODESIS ;  Surgeon: Wylene Simmer, MD;  Location: [place];  Service: Orthopedics;  Laterality: Right;   APPENDECTOMY     BACK SURGERY     x4  fusion T12 thru sacrum; U rod in sacrum   CATARACT EXTRACTION, BILATERAL Bilateral 2015   GASTROCNEMIUS RECESSION Right 06/07/2016   Procedure: RIGHT GASTROC RECESSION ;  Surgeon: Wylene Simmer, MD;  Location: [place];  Service: Orthopedics;  Laterality: Right;   HAND SURGERY Left 2003   x2   L heel surgery for torn tendon Left 2015   PLANTAR FASCIA SURGERY Right 2009   surgery at [place] Right 2007   [place] Right 1949   foot-  lacerated tendon in arch of foot   TOTAL HIP ARTHROPLASTY Left 10/05/2021   Procedure: TOTAL HIP ARTHROPLASTY ANTERIOR APPROACH;  Surgeon: Rod Can, MD;  Location: [facility];  Service: Orthopedics;  Laterality: Left;     Home Medications:  Prior to Admission medications   Medication Sig Start Date End Date Taking? Authorizing Provider  acetaminophen (TYLENOL) 325 MG tablet Take 1-2 tablets (325-650 mg total) by mouth every 6 (six) hours as needed for mild pain (pain score 1-3 or temp > 100.5). 10/06/21   Dorothyann Peng, PA-C   apixaban (ELIQUIS) 2.5 MG TABS tablet Take 1 tablet (2.5 mg total) by mouth 2 (two) times daily. 10/06/21 11/05/21  Dorothyann Peng, PA-C  Cholecalciferol (VITAMIN D) 50 MCG (2000 UT) tablet Take 2,000 Units by mouth daily.    [provider]  COLLAGEN PO Take 12 g by mouth daily.    [provider]  docusate sodium (COLACE) 100 MG capsule Take 1 capsule (100 mg total) by mouth 2 (two) times daily. 10/06/21   Dorothyann Peng, PA-C  famotidine (PEPCID) 20 MG tablet Take 20 mg by mouth 2 (two) times daily.    [provider]  Humidifier/Vaporizer Supplies [facility] VAPORIZER SCENT) PADS Inhale 1 Dose into the lungs daily as needed (congestion).    [provider]  HYDROcodone-acetaminophen (NORCO) 7.5-325 MG tablet Take 1-2 tablets by mouth every 4 (four) hours as needed for severe pain (pain score 7-10). 10/06/21   Dorothyann Peng, PA-C  levothyroxine (SYNTHROID) 88 MCG tablet Take 88 mcg by mouth daily before breakfast.    [provider]  ondansetron (ZOFRAN) 4 MG tablet Take 1 tablet (4 mg total) by mouth every 6 (six) hours as needed for nausea. 10/06/21   Dorothyann Peng, PA-C  Polyethyl Glycol-Propyl Glycol (SYSTANE OP) Place 1 drop into both eyes daily as needed (dry eyes).    [provider]  senna (SENOKOT) 8.6 MG TABS tablet Take 1 tablet (8.6 mg total) by mouth 2 (two) times daily. 10/06/21   Dorothyann Peng, PA-C    Inpatient Medications: Scheduled Meds:  azithromycin  500 mg Oral Daily   Continuous Infusions:  cefTRIAXone (ROCEPHIN)  IV 2 g (10/30/22 0904)   diltiazem (CARDIZEM) infusion 15 mg/hr (10/30/22 1038)   heparin Stopped (10/30/22 1144)   PRN Meds: guaiFENesin, ipratropium-albuterol  Allergies:    Allergies  Allergen Reactions   Duloxetine Nausea Only   Voltaren [Diclofenac Sodium] Shortness Of Breath and Palpitations   Aspirin Other (See Comments)    ulcers   Gabapentin Itching   Ultram [Tramadol  Hcl] Other (See Comments)    Effects cognitive abilities    Proton Pump Inhibitors Rash    Itching, rash    Social History:   Social History   Socioeconomic History   Marital status: Widowed    Spouse name: Not on file   Number of children: 1   Years of education: HS+   Highest education level: Not on file  Occupational History   Occupation: Retired   Tobacco Use   Smoking status: Former    Types: Cigarettes    Quit date: 06/04/1986    Years since quitting: 36.4   Smokeless tobacco: Never   Tobacco comments:    Only smoked a few years  Vaping Use   Vaping Use: Never used  Substance and Sexual Activity   Alcohol use: No    Alcohol/week: 0.0 standard drinks of alcohol   Drug use: No  Sexual activity: Not on file  Other Topics Concern   Not on file  Social History Narrative   Drinks about 3 cups of coffee a day    Social Determinants of Health   Financial Resource Strain: Not on file  Food Insecurity: Not on file  Transportation Needs: Not on file  Physical Activity: Not on file  Stress: Not on file  Social Connections: Not on file  Intimate Partner Violence: Not on file    Family History:    Family History  Problem Relation Age of Onset   Hypertension Mother    Lung cancer Mother    Lung cancer Father    Diabetes Sister    Hypertension Sister    Congestive Heart Failure Sister    Arthritis Brother      ROS:  Please see the history of present illness.   All other ROS reviewed and negative.     Physical Exam/Data:   Vitals:   10/30/22 0028 10/30/22 0505 10/30/22 0735 10/30/22 1156  BP: (!) 120/90 110/78 (!) 119/92 (!) 142/99  Pulse: 73 99 98 (!) 105  Resp: 20 (!) '21 15 18  '$ Temp: (!) 97.5 F (36.4 C) 97.6 F (36.4 C) 97.8 F (36.6 C)   TempSrc: Oral Oral Oral   SpO2: 96% 96% 97% 97%  Weight:      Height:        Intake/Output Summary (Last 24 hours) at 10/30/2022 1413 Last data filed at 10/30/2022 1300 Gross per 24 hour  Intake 120 ml   Output --  Net 120 ml      10/30/2022   12:16 AM 10/29/2022    3:47 PM 10/05/2021    8:00 AM  Last 3 Weights  Weight (lbs) 171 lb 8.3 oz 172 lb 172 lb  Weight (kg) 77.8 kg 78.019 kg 78.019 kg     Body mass index is 26.47 kg/m.  General:  Well nourished, well developed, in no acute distress HEENT: normal Neck: no JVD Vascular: No carotid bruits; Distal pulses 2+ bilaterally Cardiac:  normal S1, S2; Irreg Irreg; + systolic murmur  Lungs:  Diminished in RLL Abd: soft, nontender, no hepatomegaly  Ext: no edema Musculoskeletal:  No deformities, BUE and BLE strength normal and equal Skin: warm and dry  Neuro:  CNs 2-12 intact, no focal abnormalities noted Psych:  Normal affect   EKG:  The EKG was personally reviewed and demonstrates:  Atrial fibrillation with RVR, 153 bpm  Relevant CV Studies:  Echo: 10/30/22  IMPRESSIONS     1. Left ventricular ejection fraction, by estimation, is 40 to 45%. The  left ventricle has mildly decreased function. The left ventricle  demonstrates global hypokinesis. There is moderate asymmetric left  ventricular hypertrophy of the basal-septal  segment. Left ventricular diastolic parameters are indeterminate.   2. Right ventricular systolic function is normal. The right ventricular  size is normal. There is normal pulmonary artery systolic pressure.   3. Left atrial size was mildly dilated.   4. The mitral valve is abnormal. Moderate to severe mitral valve  regurgitation. No evidence of mitral stenosis.   5. The aortic valve was not well visualized. Aortic valve regurgitation  is trivial. No aortic stenosis is present.   6. The inferior vena cava is dilated in size with >50% respiratory  variability, suggesting right atrial pressure of 8 mmHg.   Comparison(s): MR is worse from prior report.   FINDINGS   Left Ventricle: Left ventricular ejection fraction, by estimation, is 40  to 45%. The left ventricle has mildly decreased function. The  left  ventricle demonstrates global hypokinesis. The left ventricular internal  cavity size was normal in size. There is   moderate asymmetric left ventricular hypertrophy of the basal-septal  segment. Left ventricular diastolic parameters are indeterminate.   Right Ventricle: The right ventricular size is normal. No increase in  right ventricular wall thickness. Right ventricular systolic function is  normal. There is normal pulmonary artery systolic pressure. The tricuspid  regurgitant velocity is 2.72 m/s, and   with an assumed right atrial pressure of 3 mmHg, the estimated right  ventricular systolic pressure is 58.5 mmHg.   Left Atrium: Left atrial size was mildly dilated.   Right Atrium: Right atrial size was normal in size.   Pericardium: The pericardium was not well visualized.   Mitral Valve: MR volume 52. The mitral valve is abnormal. Mild mitral  annular calcification. 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 No  evidence of mitral valve stenosis.   Tricuspid Valve: The tricuspid valve is not well visualized. Tricuspid  valve regurgitation is not demonstrated. No evidence of tricuspid  stenosis.   Aortic Valve: The aortic valve was not well visualized. Aortic valve  regurgitation is trivial. No aortic stenosis is present.   Pulmonic Valve: The pulmonic valve was not well visualized. Pulmonic valve  regurgitation is not visualized. No evidence of pulmonic stenosis.   Aorta: The aortic root, ascending aorta and aortic arch are all  structurally normal, with no evidence of dilitation or obstruction.   Venous: The inferior vena cava is dilated in size with greater than 50%  respiratory variability, suggesting right atrial pressure of 8 mmHg.   IAS/Shunts: No atrial level shunt detected by color flow Doppler.    Laboratory Data:  High Sensitivity Troponin:   Recent Labs  Lab 10/29/22 1552 10/29/22 2018  TROPONINIHS 13 11     Chemistry Recent Labs  Lab  10/29/22 1552 10/30/22 0354  NA 141 140  K 4.0 5.1  CL 110 111  CO2 20* 18*  GLUCOSE 123* 179*  BUN 24* 22  CREATININE 1.03* 0.98  CALCIUM 9.1 9.0  MG 2.2  --   GFRNONAA 55* 58*  ANIONGAP 11 11    No results for input(s): "PROT", "ALBUMIN", "AST", "ALT", "ALKPHOS", "BILITOT" in the last 168 hours. Lipids No results for input(s): "CHOL", "TRIG", "HDL", "LABVLDL", "LDLCALC", "CHOLHDL" in the last 168 hours.  Hematology Recent Labs  Lab 10/29/22 1552 10/30/22 0036  WBC 13.3* 10.8*  RBC 3.77* 3.76*  HGB 10.5* 10.6*  HCT 33.9* 33.0*  MCV 89.9 87.8  MCH 27.9 28.2  MCHC 31.0 32.1  RDW 15.2 15.1  PLT 333 298   Thyroid  Recent Labs  Lab 10/29/22 1552  TSH 2.462  FREET4 1.02    BNPNo results for input(s): "BNP", "PROBNP" in the last 168 hours.  DDimer  Recent Labs  Lab 10/29/22 2057  DDIMER 2.37*     Radiology/Studies:  ECHOCARDIOGRAM COMPLETE  Result Date: 10/30/2022    ECHOCARDIOGRAM REPORT   Patient Name:   Leslie [name] Date of Exam: 10/30/2022 Medical Rec #:  [id number]           Height:       67.5 in Accession #:    [id number]          Weight:       171.5 lb Date of Birth:  06/19/1941           BSA:  1.905 m Patient Age:    61 years            BP:           119/92 mmHg Patient Gender: F                   HR:           103 bpm. Exam Location:  Inpatient Procedure: 2D Echo, Color Doppler and Cardiac Doppler Indications:    I48.91* Unspecified atrial fibrillation  History:        Patient has prior history of Echocardiogram examinations, most                 recent 01/10/2016. Arrythmias:Atrial Fibrillation.  Sonographer:    Raquel Sarna Senior RDCS Referring Phys: [phone] A CALDWELL [place]  1. Left ventricular ejection fraction, by estimation, is 40 to 45%. The left ventricle has mildly decreased function. The left ventricle demonstrates global hypokinesis. There is moderate asymmetric left ventricular hypertrophy of the basal-septal segment. Left  ventricular diastolic parameters are indeterminate.  2. Right ventricular systolic function is normal. The right ventricular size is normal. There is normal pulmonary artery systolic pressure.  3. Left atrial size was mildly dilated.  4. The mitral valve is abnormal. 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 No evidence of mitral stenosis.  5. The aortic valve was not well visualized. Aortic valve regurgitation is trivial. No aortic stenosis is present.  6. The inferior vena cava is dilated in size with >50% respiratory variability, suggesting right atrial pressure of 8 mmHg. Comparison(s): MR is worse from prior report. FINDINGS  Left Ventricle: Left ventricular ejection fraction, by estimation, is 40 to 45%. The left ventricle has mildly decreased function. The left ventricle demonstrates global hypokinesis. The left ventricular internal cavity size was normal in size. There is  moderate asymmetric left ventricular hypertrophy of the basal-septal segment. Left ventricular diastolic parameters are indeterminate. Right Ventricle: The right ventricular size is normal. No increase in right ventricular wall thickness. Right ventricular systolic function is normal. There is normal pulmonary artery systolic pressure. The tricuspid regurgitant velocity is 2.72 m/s, and  with an assumed right atrial pressure of 3 mmHg, the estimated right ventricular systolic pressure is 35.4 mmHg. Left Atrium: Left atrial size was mildly dilated. Right Atrium: Right atrial size was normal in size. Pericardium: The pericardium was not well visualized. Mitral Valve: MR volume 52. The mitral valve is abnormal. Mild mitral annular calcification. 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 No evidence of mitral valve stenosis. Tricuspid Valve: The tricuspid valve is not well visualized. Tricuspid valve regurgitation is not demonstrated. No evidence of tricuspid stenosis. Aortic Valve: The aortic valve was not well visualized. Aortic  valve regurgitation is trivial. No aortic stenosis is present. Pulmonic Valve: The pulmonic valve was not well visualized. Pulmonic valve regurgitation is not visualized. No evidence of pulmonic stenosis. Aorta: The aortic root, ascending aorta and aortic arch are all structurally normal, with no evidence of dilitation or obstruction. Venous: The inferior vena cava is dilated in size with greater than 50% respiratory variability, suggesting right atrial pressure of 8 mmHg. IAS/Shunts: No atrial level shunt detected by color flow Doppler.  LEFT VENTRICLE PLAX 2D LVIDd:         4.20 cm LVIDs:         3.50 cm LV PW:         0.90 cm LV IVS:        1.25 cm LVOT  diam:     2.10 cm LV SV:         40 LV SV Index:   21 LVOT Area:     3.46 cm  RIGHT VENTRICLE RV S prime:     10.30 cm/s TAPSE (M-mode): 1.3 cm LEFT ATRIUM             Index        RIGHT ATRIUM           Index LA diam:        3.80 cm 1.99 cm/m   RA Area:     13.50 cm LA Vol (A2C):   65.3 ml 34.28 ml/m  RA Volume:   31.30 ml  16.43 ml/m LA Vol (A4C):   76.4 ml 40.11 ml/m LA Biplane Vol: 75.7 ml 39.74 ml/m  AORTIC VALVE LVOT Vmax:   74.00 cm/s LVOT Vmean:  48.700 cm/s LVOT VTI:    0.116 m  AORTA Ao Root diam: 3.30 cm Ao Asc diam:  3.10 cm MR Peak grad:    84.6 mmHg    TRICUSPID VALVE MR Mean grad:    60.0 mmHg    TR Peak grad:   29.6 mmHg MR Vmax:         460.00 cm/s  TR Vmax:        272.00 cm/s MR Vmean:        370.5 cm/s MR PISA:         4.02 cm     SHUNTS MR PISA Eff ROA: 28 mm       Systemic VTI:  0.12 m MR PISA Radius:  0.80 cm      Systemic Diam: 2.10 cm Rudean Haskell MD Electronically signed by Rudean Haskell MD Signature Date/Time: 10/30/2022/10:54:26 AM    Final    CT Angio Chest PE W and/or Wo Contrast  Result Date: 10/29/2022 CLINICAL DATA:  Pulmonary embolism (PE) suspected, low to intermediate prob, positive D-dimer EXAM: CT ANGIOGRAPHY CHEST WITH CONTRAST TECHNIQUE: Multidetector CT imaging of the chest was performed using  the standard protocol during bolus administration of intravenous contrast. Multiplanar CT image reconstructions and MIPs were obtained to evaluate the vascular anatomy. RADIATION DOSE REDUCTION: This exam was performed according to the departmental dose-optimization program which includes automated exposure control, adjustment of the mA and/or kV according to patient size and/or use of iterative reconstruction technique. CONTRAST:  57m OMNIPAQUE IOHEXOL 350 MG/ML SOLN COMPARISON:  None Available. FINDINGS: Cardiovascular: Heart is normal size. Aorta is normal caliber. Scattered coronary artery and aortic calcifications. No filling defects in the pulmonary arteries to suggest pulmonary emboli. Mediastinum/Nodes: Mildly prominent mediastinal and bilateral hilar lymph nodes. No axillary adenopathy. Trachea and esophagus are unremarkable. Lungs/Pleura: Moderate right pleural effusion. Airspace disease in the right lower lobe compatible with pneumonia. Clustered tree-in-bud nodular densities in the right upper lobe also likely infectious. No confluent opacity on the left. Upper Abdomen: No acute findings Musculoskeletal: Chest wall soft tissues are unremarkable. No acute bony abnormality. Review of the MIP images confirms the above findings. IMPRESSION: Right lower lobe consolidation with moderate right pleural effusion. Tree-in-bud nodular densities in the right upper lobe. Findings compatible with pneumonia. No evidence of pulmonary embolus. Coronary artery disease. Aortic Atherosclerosis (ICD10-I70.0). Electronically Signed   By: KRolm BaptiseM.D.   On: 10/29/2022 23:43   DG Chest Portable 1 View  Result Date: 10/29/2022 CLINICAL DATA:  Atrial fibrillation, palpitations EXAM: PORTABLE CHEST 1 VIEW COMPARISON:  10/23/2022 FINDINGS: Transverse diameter of heart is  slightly increased. There are no signs of alveolar pulmonary edema. There is interval decrease in patchy infiltrates in right parahilar region and right  lower lung field. New small patchy infiltrate is seen in the lateral aspect of left lower lung field. There is no pleural effusion or pneumothorax. IMPRESSION: There is interval decrease in patchy infiltrates in right mid and right lower lung fields suggesting resolving atelectasis/pneumonia. There is new small patchy infiltrate in left lower lung fields suggesting multifocal pneumonia. Electronically Signed   By: Elmer Picker [name]   On: 10/29/2022 16:37     Assessment and Plan:   Leslie [name] is a 81 y.o. female with a hx of hypothyroidism, GERD who is being seen 10/30/2022 for the evaluation of new onset afib at the request of Dr. Florene Glen.  New onset Afib RVR -- denies any palpitations prior to admission, has been short of breath with recent PNA dx -- started on IV Diltiazem with improved rates, currently '15mg'$ /hr with stable blood pressures -- on IV heparin, planned for thoracentesis today -- discussed the need for [facility] once invasive procedures completed. Reports having been on Eliquis briefly in the past after hip replacement. Cost check. -- Echo reads LVEF of 40-45% with global hypokinesis, will transition from IV Diltiazem to metoprolol '50mg'$  BID  PNA Right pleural effusion -- has been on outpatient antibiotics per PCP -- CT angio with right sided pleural effusion/consolidation  -- planned for thoracentesis today -- antibiotics per primary  Moderate to Severe MR -- new finding on echo this admission, does have + systolic murmur on exam though not very prominent.  -- given her going afib RVR, would ideally repeat echo once back in [place] to determine true degree of MR along with LV function.    Hypothyroidism -- TSH 2.462, T4 1.02 -- PTA synthroid  Pre DM -- Hgb A1c 6.2 -- check lipids  Risk Assessment/Risk Scores:   CHA2DS2-VASc Score = 5  This indicates a 7.2% annual risk of stroke. The patient's score is based upon: CHF History: 0 HTN History: 0 Diabetes History:  0 Stroke History: 2 Vascular Disease History: 0 Age Score: 2 Gender Score: 1   For questions or updates, please contact [facility] Please consult [email or web address] for contact info under    Signed, Reino Bellis, NP  10/30/2022 2:13 PM

## 2022-10-30 NOTE — Evaluation (Signed)
Physical Therapy Evaluation Patient Details Name: Leslie [name] MRN: [id number] DOB: 01-Jul-1941 Today's Date: 10/30/2022  History of Present Illness  Pt is an 81 y.o. female admitted 10/29/22 after being directed by PCP to ED for afib with RVR; workup also revealed CAP. PMH includes CKD, stroke, arthritis, insomnia, plantar fasciitis, back sx, L THA (2022).   Clinical Impression  Pt presents with an overall decrease in functional mobility secondary to above. PTA, pt mod indep with intermittent use of SPC, lives alone, enjoys staying as active as her health will let her. Today, pt moving well with intermittent guard for balance, asymptomatic; pt happy to get to be OOB. Pt's daughter present and supportive. Pt would benefit from continued acute PT services to maximize functional mobility and independence prior to d/c home.    HR 80s-120s    Recommendations for follow up therapy are one component of a multi-disciplinary discharge planning process, led by the attending physician.  Recommendations may be updated based on patient status, additional functional criteria and insurance authorization.  Follow Up Recommendations No PT follow up      Assistance Recommended at Discharge Intermittent Supervision/Assistance  Patient can return home with the following  Assist for transportation    Equipment Recommendations None recommended by PT  Recommendations for Other Services   Mobility Specialist  Functional Status Assessment Patient has had a recent decline in their functional status and demonstrates the ability to make significant improvements in function in a reasonable and predictable amount of time.     Precautions / Restrictions Precautions Precautions: Fall Restrictions Weight Bearing Restrictions: No      Mobility  Bed Mobility Overal bed mobility: Modified Independent                  Transfers Overall transfer level: Needs assistance Equipment used:  None Transfers: Sit to/from Stand Sit to Stand: Min guard           General transfer comment: min guard for balance; 1x bout self-corrected instability    Ambulation/Gait Ambulation/Gait assistance: Min guard Gait Distance (Feet): 200 Feet Assistive device: None Gait Pattern/deviations: Step-through pattern, Decreased stride length Gait velocity: Decreased     General Gait Details: slow, guarded gait without DME, min guard for balance, 1x self-corrected LOB; stability improving with distances  Stairs            Wheelchair Mobility    Modified Rankin (Stroke Patients Only)       Balance Overall balance assessment: Needs assistance   Sitting balance-Leahy Scale: Good Sitting balance - Comments: indep to adjust bilateral socks sitting EOB; indep with pericare   Standing balance support: No upper extremity supported, During functional activity Standing balance-Leahy Scale: Fair Standing balance comment: guarded static standing and gait without UE support                             Pertinent Vitals/Pain Pain Assessment Pain Assessment: (P) No/denies pain    Home Living Family/patient expects to be discharged to:: Private residence Living Arrangements: Alone Available Help at Discharge: Neighbor;Available PRN/intermittently Type of Home: House Home Access: Stairs to enter Entrance Stairs-Rails: Right;Left Entrance Stairs-Number of Steps: 2 Alternate Level Stairs-Number of Steps: 4 steps down to main living room w/ rail Home Layout: Two level;Able to live on main level with bedroom/bathroom Home Equipment: Rolling Walker (2 wheels);Cane - single point;Shower seat Additional Comments: "I can call my neighbor if I just have to have  help with something." Daughter lives in [place]    Prior Function Prior Level of Function : Independent/Modified Independent;Driving             Mobility Comments: Mod indep with intermittent use of SPC ADLs  Comments: stands in walk-in shower     Hand Dominance        Extremity/Trunk Assessment   Upper Extremity Assessment Upper Extremity Assessment: Overall WFL for tasks assessed    Lower Extremity Assessment Lower Extremity Assessment: Overall WFL for tasks assessed       Communication   Communication: No difficulties  Cognition Arousal/Alertness: Awake/alert Behavior During Therapy: WFL for tasks assessed/performed Overall Cognitive Status: Within Functional Limits for tasks assessed                                 General Comments: WFL for simple tasks, verbose at times requiring some redirection, very friendly and aware of deficits; suspect baseline cognition        General Comments General comments (skin integrity, edema, etc.): pt's daughter present and supportive. HR up to 120s with mobility; pt denies SOB or dizziness    Exercises     Assessment/Plan    PT Assessment Patient needs continued PT services  PT Problem List Decreased activity tolerance;Decreased balance;Decreased mobility;Cardiopulmonary status limiting activity       PT Treatment Interventions DME instruction;Gait training;Stair training;Functional mobility training;Therapeutic activities;Therapeutic exercise;Patient/family education;Balance training    PT Goals (Current goals can be found in the Care Plan section)  Acute Rehab PT Goals Patient Stated Goal: "I'm happy to be moving" PT Goal Formulation: With patient Time For Goal Achievement: 11/13/22 Potential to Achieve Goals: Good    Frequency Min 3X/week     Co-evaluation               AM-PAC PT "6 Clicks" Mobility  Outcome Measure Help needed turning from your back to your side while in a flat bed without using bedrails?: None Help needed moving from lying on your back to sitting on the side of a flat bed without using bedrails?: None Help needed moving to and from a bed to a chair (including a wheelchair)?: A  Little Help needed standing up from a chair using your arms (e.g., wheelchair or bedside chair)?: A Little Help needed to walk in hospital room?: A Little Help needed climbing 3-5 steps with a railing? : A Little 6 Click Score: 20    End of Session Equipment Utilized During Treatment: Gait belt Activity Tolerance: Patient tolerated treatment well Patient left: in chair;with call bell/phone within reach;with family/visitor present Nurse Communication: Mobility status PT Visit Diagnosis: Other abnormalities of gait and mobility (R26.89)    Time: 3785-8850 PT Time Calculation (min) (ACUTE ONLY): 21 min   Charges:   PT Evaluation $PT Eval Moderate Complexity: Parsons, PT, DPT Acute Rehabilitation Services  Personal: [facility] Office: [place] 10/30/2022, 1:27 PM

## 2022-10-30 NOTE — TOC Progression Note (Addendum)
Transition of Care [facility]) - Progression Note    Patient Details  Name: Leslie [name] MRN: [id number] Date of Birth: 04/16/1941  [facility] CM/SW Contact  Zenon Mayo, RN Phone Number: 10/30/2022, 11:08 AM  Clinical Narrative:    from home, indep, afib RVR, PNA  hep and cardizem drip, iv abx, cx pending.   Benefit check for eliquis is in process.TOC following.        Expected Discharge Plan and Services                                                 Social Determinants of Health (SDOH) Interventions    Readmission Risk Interventions     No data to display

## 2022-10-30 NOTE — Progress Notes (Signed)
Seen and examined in pre-op. CT reviewed. Needs thora for fluid analysis, drainage to relieve dyspnea.  Discussed procedure including indications, risks, benefits. She consents to proceed.   Julian Hy, DO 10/30/22 3:01 PM [facility]

## 2022-10-30 NOTE — Plan of Care (Signed)
PT alert and oriented, able to make needs known. Heparin drip stopped when Thoracentesis ordered. Diltiazem drip stopped when PT returned from procedure and Metoprolol was given. PT's heart rate is very sensitive to activity and PT reports some nausea  when up too long also noted to be in controled Afib with occasional sinus beats and appears to be skipping beats and having occasional PVCs. Dr. Marcelline Deist notified via secure chat, awaiting response.  VSS otherwise stable. Aspiration site on right back covered with Band-Aid with small amount of shadowing.  PT denies pain. Able to make needs known.  Daughter at bedside. Call bell in reach.  Problem: Education: Goal: Knowledge of General Education information will improve Description: Including pain rating scale, medication(s)/side effects and non-pharmacologic comfort measures Outcome: Progressing   Problem: Health Behavior/Discharge Planning: Goal: Ability to manage health-related needs will improve Outcome: Progressing   Problem: Clinical Measurements: Goal: Ability to maintain clinical measurements within normal limits will improve Outcome: Progressing Goal: Will remain free from infection Outcome: Progressing Goal: Diagnostic test results will improve Outcome: Progressing Goal: Respiratory complications will improve Outcome: Progressing Goal: Cardiovascular complication will be avoided Outcome: Progressing   Problem: Activity: Goal: Risk for activity intolerance will decrease Outcome: Progressing   Problem: Nutrition: Goal: Adequate nutrition will be maintained Outcome: Progressing   Problem: Coping: Goal: Level of anxiety will decrease Outcome: Progressing   Problem: Elimination: Goal: Will not experience complications related to bowel motility Outcome: Progressing Goal: Will not experience complications related to urinary retention Outcome: Progressing   Problem: Pain Managment: Goal: General experience of comfort will  improve Outcome: Progressing   Problem: Safety: Goal: Ability to remain free from injury will improve Outcome: Progressing   Problem: Skin Integrity: Goal: Risk for impaired skin integrity will decrease Outcome: Progressing

## 2022-10-31 ENCOUNTER — Inpatient Hospital Stay (HOSPITAL_COMMUNITY): Payer: Medicare HMO

## 2022-10-31 ENCOUNTER — Other Ambulatory Visit (HOSPITAL_COMMUNITY): Payer: Self-pay

## 2022-10-31 DIAGNOSIS — J90 Pleural effusion, not elsewhere classified: Secondary | ICD-10-CM | POA: Diagnosis not present

## 2022-10-31 DIAGNOSIS — I4891 Unspecified atrial fibrillation: Secondary | ICD-10-CM | POA: Diagnosis not present

## 2022-10-31 DIAGNOSIS — J189 Pneumonia, unspecified organism: Secondary | ICD-10-CM | POA: Diagnosis not present

## 2022-10-31 LAB — CBC WITH DIFFERENTIAL/PLATELET
Abs Immature Granulocytes: 0.08 10*3/uL — ABNORMAL HIGH (ref 0.00–0.07)
Basophils Absolute: 0 10*3/uL (ref 0.0–0.1)
Basophils Relative: 0 %
Eosinophils Absolute: 0 10*3/uL (ref 0.0–0.5)
Eosinophils Relative: 0 %
HCT: 32.9 % — ABNORMAL LOW (ref 36.0–46.0)
Hemoglobin: 9.9 g/dL — ABNORMAL LOW (ref 12.0–15.0)
Immature Granulocytes: 1 %
Lymphocytes Relative: 14 %
Lymphs Abs: 2 10*3/uL (ref 0.7–4.0)
MCH: 27.5 pg (ref 26.0–34.0)
MCHC: 30.1 g/dL (ref 30.0–36.0)
MCV: 91.4 fL (ref 80.0–100.0)
Monocytes Absolute: 0.9 10*3/uL (ref 0.1–1.0)
Monocytes Relative: 7 %
Neutro Abs: 11.4 10*3/uL — ABNORMAL HIGH (ref 1.7–7.7)
Neutrophils Relative %: 78 %
Platelets: 327 10*3/uL (ref 150–400)
RBC: 3.6 MIL/uL — ABNORMAL LOW (ref 3.87–5.11)
RDW: 15.6 % — ABNORMAL HIGH (ref 11.5–15.5)
WBC: 14.4 10*3/uL — ABNORMAL HIGH (ref 4.0–10.5)
nRBC: 0 % (ref 0.0–0.2)

## 2022-10-31 LAB — LIPID PANEL
Cholesterol: 151 mg/dL (ref 0–200)
HDL: 53 mg/dL (ref >40)
LDL Cholesterol: 86 mg/dL (ref 0–99)
Total CHOL/HDL Ratio: 2.8 RATIO
Triglycerides: 61 mg/dL (ref <150)
VLDL: 12 mg/dL (ref 0–40)

## 2022-10-31 LAB — COMPREHENSIVE METABOLIC PANEL
ALT: 64 U/L — ABNORMAL HIGH (ref 0–44)
AST: 47 U/L — ABNORMAL HIGH (ref 15–41)
Albumin: 3.1 g/dL — ABNORMAL LOW (ref 3.5–5.0)
Alkaline Phosphatase: 89 U/L (ref 38–126)
Anion gap: 13 (ref 5–15)
BUN: 29 mg/dL — ABNORMAL HIGH (ref 8–23)
CO2: 17 mmol/L — ABNORMAL LOW (ref 22–32)
Calcium: 9 mg/dL (ref 8.9–10.3)
Chloride: 109 mmol/L (ref 98–111)
Creatinine, Ser: 1.07 mg/dL — ABNORMAL HIGH (ref 0.44–1.00)
GFR, Estimated: 53 mL/min — ABNORMAL LOW (ref >60)
Glucose, Bld: 134 mg/dL — ABNORMAL HIGH (ref 70–99)
Potassium: 4.3 mmol/L (ref 3.5–5.1)
Sodium: 139 mmol/L (ref 135–145)
Total Bilirubin: 0.4 mg/dL (ref 0.3–1.2)
Total Protein: 6.6 g/dL (ref 6.5–8.1)

## 2022-10-31 LAB — VITAMIN B12: Vitamin B-12: 257 pg/mL (ref 180–914)

## 2022-10-31 LAB — IRON AND TIBC
Iron: 45 ug/dL (ref 28–170)
Saturation Ratios: 13 % (ref 10.4–31.8)
TIBC: 350 ug/dL (ref 250–450)
UIBC: 305 ug/dL

## 2022-10-31 LAB — HEPARIN LEVEL (UNFRACTIONATED): Heparin Unfractionated: <0.1 IU/mL — ABNORMAL LOW (ref 0.30–0.70)

## 2022-10-31 LAB — BRAIN NATRIURETIC PEPTIDE: B Natriuretic Peptide: 733.7 pg/mL — ABNORMAL HIGH (ref 0.0–100.0)

## 2022-10-31 LAB — FOLATE: Folate: 10.9 ng/mL (ref >5.9)

## 2022-10-31 LAB — MAGNESIUM: Magnesium: 2.2 mg/dL (ref 1.7–2.4)

## 2022-10-31 LAB — TROPONIN I (HIGH SENSITIVITY)
Troponin I (High Sensitivity): 12 ng/L (ref <18)
Troponin I (High Sensitivity): 14 ng/L (ref <18)

## 2022-10-31 LAB — PHOSPHORUS: Phosphorus: 3.3 mg/dL (ref 2.5–4.6)

## 2022-10-31 LAB — FERRITIN: Ferritin: 78 ng/mL (ref 11–307)

## 2022-10-31 MED ORDER — FUROSEMIDE 10 MG/ML IJ SOLN: 40.0000 mg | Freq: Once | INTRAMUSCULAR | Status: DC

## 2022-10-31 MED ORDER — METOPROLOL TARTRATE 50 MG PO TABS
75.0000 mg | ORAL_TABLET | Freq: Two times a day (BID) | ORAL | Status: DC
  Administered 2022-10-31: 75 mg via ORAL
  Filled 2022-10-31: qty 1

## 2022-10-31 MED ORDER — MELATONIN 5 MG PO TABS
5.0000 mg | ORAL_TABLET | Freq: Every evening | ORAL | Status: DC | PRN
  Administered 2022-10-31 – 2022-11-06 (×3): 5 mg via ORAL
  Filled 2022-10-31 (×3): qty 1

## 2022-10-31 MED ORDER — METOPROLOL TARTRATE 5 MG/5ML IV SOLN
5.0000 mg | INTRAVENOUS | Status: AC | PRN
  Administered 2022-10-31 (×3): 5 mg via INTRAVENOUS
  Filled 2022-10-31 (×3): qty 5

## 2022-10-31 MED ORDER — APIXABAN 5 MG PO TABS
5.0000 mg | ORAL_TABLET | Freq: Two times a day (BID) | ORAL | Status: DC
  Administered 2022-10-31 – 2022-11-03 (×7): 5 mg via ORAL
  Filled 2022-10-31 (×7): qty 1

## 2022-10-31 MED ORDER — FUROSEMIDE 10 MG/ML IJ SOLN
40.0000 mg | Freq: Every day | INTRAMUSCULAR | Status: DC
  Administered 2022-11-01 – 2022-11-03 (×3): 40 mg via INTRAVENOUS
  Filled 2022-10-31 (×3): qty 4

## 2022-10-31 MED ORDER — FAMOTIDINE 20 MG PO TABS
20.0000 mg | ORAL_TABLET | Freq: Every day | ORAL | Status: DC | PRN
  Administered 2022-10-31 – 2022-11-02 (×3): 20 mg via ORAL
  Filled 2022-10-31 (×3): qty 1

## 2022-10-31 MED ORDER — METOPROLOL TARTRATE 100 MG PO TABS
100.0000 mg | ORAL_TABLET | Freq: Two times a day (BID) | ORAL | Status: DC
  Administered 2022-10-31: 100 mg via ORAL
  Filled 2022-10-31: qty 1

## 2022-10-31 MED ORDER — LEVOTHYROXINE SODIUM 88 MCG PO TABS
88.0000 ug | ORAL_TABLET | Freq: Every day | ORAL | Status: DC
  Administered 2022-11-01 – 2022-11-08 (×8): 88 ug via ORAL
  Filled 2022-10-31 (×8): qty 1

## 2022-10-31 MED ORDER — AMIODARONE HCL IN DEXTROSE 360-4.14 MG/200ML-% IV SOLN
30.0000 mg/h | INTRAVENOUS | Status: DC
  Administered 2022-11-01: 30 mg/h via INTRAVENOUS
  Filled 2022-10-31: qty 200

## 2022-10-31 MED ORDER — AMIODARONE HCL IN DEXTROSE 360-4.14 MG/200ML-% IV SOLN
60.0000 mg/h | INTRAVENOUS | Status: DC
  Administered 2022-10-31 (×2): 60 mg/h via INTRAVENOUS
  Filled 2022-10-31 (×2): qty 200

## 2022-10-31 MED ORDER — FUROSEMIDE 10 MG/ML IJ SOLN
40.0000 mg | INTRAMUSCULAR | Status: AC
  Administered 2022-10-31: 40 mg via INTRAVENOUS
  Filled 2022-10-31: qty 4

## 2022-10-31 MED ORDER — VITAMIN B-12 1000 MCG PO TABS
1000.0000 ug | ORAL_TABLET | Freq: Every day | ORAL | Status: DC
  Administered 2022-10-31 – 2022-11-08 (×9): 1000 ug via ORAL
  Filled 2022-10-31 (×9): qty 1

## 2022-10-31 MED ORDER — AMIODARONE LOAD VIA INFUSION
150.0000 mg | Freq: Once | INTRAVENOUS | Status: AC
  Administered 2022-10-31: 150 mg via INTRAVENOUS
  Filled 2022-10-31: qty 83.34

## 2022-10-31 NOTE — Progress Notes (Addendum)
PROGRESS NOTE    Leslie [name]  HKV:[id number] DOB: 03-Oct-1941 DOA: 10/29/2022 PCP: Sonia Side., FNP  Chief Complaint  Patient presents with   new onset atrial fib    Brief Narrative:  This is Leslie [name] 81 year old female with past medical history of hypothyroidism and GERD.  In October, the patient had Leslie [name] series of unfortunate events.  Her bathroom blocked up, [employer] came and tore her floors up.  The smells irritates her respiratory passages and caused nausea and vomiting.  She went on to develop Leslie [name] hacking nonproductive cough.  She saw her PCP, he started on 2 antibiotics and p.o. steroids.  She states she did not get better.  Additionally, she developed palpitations that were worse at night.  She was short of breath, and had Leslie [name] mild wheeze.  She denies fever, chills.  She endorse some lightheadedness and dizziness and was very weak.  She had no appetite..  She returned to her PCP today where EKG was done revealing Leslie [name] with RVR.  The patient was directed to the ER.  Patient has no prior history of atrial fibrillation.   Patient transferred from freestanding ER.  History provided by the patient  Assessment & Plan:   Principal Problem:   Atrial fibrillation with RVR ([place]) Active Problems:   Community acquired pneumonia of left lower lobe of lung   Pleural effusion  Addendum Called for SOB, assessed patient at bedside, she appears more uncomfortable at this point in time, c/o SOB.  Denies any CP.  Feels more SOB lying flat.  Satting in the high 90's on 2 L, rate irregular, between 110's-130's on telemetry, normotensive with SBP 110's when I was in room  Appears uncomfortable, lying still, appears to be avoiding too much movement Irregularly irregular, tachycardic Diminished breath sounds, but no clear adventitious sounds on exam No LEE, only trace dependent edema   Leslie [name]/P Shortness of breath Suspect related to RVR.  Getting metoprolol 5 mg q5 min x3.  If she doesn't respond  with that, will plan to start amiodarone (avoiding diltiazem given decreased EF).  Additional pending w/u for SOB with CXR, EKG (afib with RVR), troponins, BNP.  Given orthopnea, ? Need for some lasix though not impressive exam, will follow CXR, correct rate and follow from there.  Transfer to progressive.  40 min critical care time.  Atrial fibrillation with RVR New onset in the setting of pneumonia Chadsvasc 23 (age, sex, HF, hx TIA) Currently on heparin gtt -> can transition to doac now that thora is complete, will confirm with pulm metoprolol per cards - uptitrate as needed for RVR TSH wnl Echo with mildly decreased EF 40-45%, moderate asymmetric LVH, moderate to severe MVR Appreciate cardiology assistance  Community Acquired Pneumonia  Right Sided Pleural Effusion She was treated outpatient with courses of cefdinir and azithromycin CT showing R lower lobe consolidation with Hurschel Paynter moderate right pleural effusion, tree in bud nodular densities in the right upper lobe CXR 11/22 with bibasilar atelectasis and probable right basilar infiltrate Currently on azithromycin and ceftriaxone, will continue Follow MRSA PCR (negative), sputum cx if able, urine strep (negative), urine legionella (pending) Blood cultures NGTD x2 S/p thoracentesis on 11/21 -> post procedure had questionable apical pneumothorax, resolved on 11/22 CXR Effusion transudative per lights criteria -> gram stain without WBC or organisms, follow culture.  Follow cytology.  HFmrEF  Mitral Valve Regurgitation Appears euvolemic Appreciate cardiology recommendations  Emphysema Needs outpatient PFTs if residual SOB  Bronchomalacia Outpatient follow up if  she develops chronic pulm symptoms  Normocytic Anemia Labs suggestive of AOCD, low normal B12 Follow methylmalonic acid, start b12 supplementation  Prediabetes Outpatient follow up  Hypothyroidism Synthroid  GERD Continue pepcid    DVT prophylaxis: heparin gtt Code  Status: full Family Communication: none Disposition:   Status is: Inpatient Remains inpatient appropriate because: awaiting cardiology eval, pulmonology c/s for thora   Consultants:  Cardiology pulmonology  Procedures:  Echo IMPRESSIONS     1. Left ventricular ejection fraction, by estimation, is 40 to 45%. The  left ventricle has mildly decreased function. The left ventricle  demonstrates global hypokinesis. There is moderate asymmetric left  ventricular hypertrophy of the basal-septal  segment. Left ventricular diastolic parameters are indeterminate.   2. Right ventricular systolic function is normal. The right ventricular  size is normal. There is normal pulmonary artery systolic pressure.   3. Left atrial size was mildly dilated.   4. The mitral valve is abnormal. Moderate to severe mitral valve  regurgitation. No evidence of mitral stenosis.   5. The aortic valve was not well visualized. Aortic valve regurgitation  is trivial. No aortic stenosis is present.   6. The inferior vena cava is dilated in size with >50% respiratory  variability, suggesting right atrial pressure of 8 mmHg.   Comparison(s): MR is worse from prior report.   Antimicrobials:  Anti-infectives (From admission, onward)    Start     Dose/Rate Route Frequency Ordered Stop   10/30/22 1000  cefTRIAXone (ROCEPHIN) 2 g in sodium chloride 0.9 % 100 mL IVPB        2 g 200 mL/hr over 30 Minutes Intravenous Every 24 hours 10/30/22 0222 11/04/22 0959   10/30/22 1000  azithromycin (ZITHROMAX) tablet 500 mg        500 mg Oral Daily 10/30/22 0222 11/04/22 0959   10/29/22 1715  cefTRIAXone (ROCEPHIN) 1 g in sodium chloride 0.9 % 100 mL IVPB        1 g 200 mL/hr over 30 Minutes Intravenous  Once 10/29/22 1705 10/29/22 1910   10/29/22 1715  azithromycin (ZITHROMAX) 500 mg in sodium chloride 0.9 % 250 mL IVPB        500 mg 250 mL/hr over 60 Minutes Intravenous  Once 10/29/22 1705 10/29/22 2022        Subjective: Note issues sleeping overnight Otherwise no CP, SOB, palpitations  Objective: Vitals:   10/31/22 0100 10/31/22 0200 10/31/22 0300 10/31/22 0756  BP: 127/73   (!) 119/97  Pulse: 98 100 (!) 152 96  Resp: '18 16 19 18  '$ Temp: (!) 97.4 F (36.3 C)     TempSrc: Oral     SpO2: 97% 99% 98% 97%  Weight:      Height:        Intake/Output Summary (Last 24 hours) at 10/31/2022 0834 Last data filed at 10/31/2022 0831 Gross per 24 hour  Intake 1210.05 ml  Output 200 ml  Net 1010.05 ml   Filed Weights   10/29/22 1547 10/30/22 0016  Weight: 78 kg 77.8 kg    Examination:  General: No acute distress. Cardiovascular: irregularly irregular, tachycardic Lungs: CTAB, unlabored, comfortable Abdomen: Soft, nontender, nondistended Neurological: Alert and oriented 3. Moves all extremities 4 with equal strength. Cranial nerves II through XII grossly intact. Extremities: No clubbing or cyanosis. No edema.   Data Reviewed: I have personally reviewed following labs and imaging studies  CBC: Recent Labs  Lab 10/29/22 1552 10/30/22 0036 10/31/22 0108  WBC 13.3* 10.8* 14.4*  NEUTROABS 8.8*  --  11.4*  HGB 10.5* 10.6* 9.9*  HCT 33.9* 33.0* 32.9*  MCV 89.9 87.8 91.4  PLT 333 298 102    Basic Metabolic Panel: Recent Labs  Lab 10/29/22 1552 10/30/22 0354 10/31/22 0108  NA 141 140 139  K 4.0 5.1 4.3  CL 110 111 109  CO2 20* 18* 17*  GLUCOSE 123* 179* 134*  BUN 24* 22 29*  CREATININE 1.03* 0.98 1.07*  CALCIUM 9.1 9.0 9.0  MG 2.2  --  2.2  PHOS  --   --  3.3    GFR: Estimated Creatinine Clearance: 45.5 mL/min (Declin Rajan) (by C-G formula based on SCr of 1.07 mg/dL (H)).  Liver Function Tests: Recent Labs  Lab 10/31/22 0108  AST 47*  ALT 64*  ALKPHOS 89  BILITOT 0.4  PROT 6.6  ALBUMIN 3.1*    CBG: No results for input(s): "GLUCAP" in the last 168 hours.   Recent Results (from the past 240 hour(s))  Resp Panel by RT-PCR (Flu Dalante Minus&B, Covid) Anterior Nasal Swab      Status: None   Collection Time: 10/29/22  4:36 PM   Specimen: Anterior Nasal Swab  Result Value Ref Range Status   SARS Coronavirus 2 by RT PCR NEGATIVE NEGATIVE Final    Comment: (NOTE) SARS-CoV-2 target nucleic acids are NOT DETECTED.  The SARS-CoV-2 RNA is generally detectable in upper respiratory specimens during the acute phase of infection. The lowest concentration of SARS-CoV-2 viral copies this assay can detect is 138 copies/mL. Terrisha Lopata negative result does not preclude SARS-Cov-2 infection and should not be used as the sole basis for treatment or other patient management decisions. Reznor Ferrando negative result may occur with  improper specimen collection/handling, submission of specimen other than nasopharyngeal swab, presence of viral mutation(s) within the areas targeted by this assay, and inadequate number of viral copies(<138 copies/mL). Myrle Dues negative result must be combined with clinical observations, patient history, and epidemiological information. The expected result is Negative.  Fact Sheet for Patients:  EntrepreneurPulse.com.au  Fact Sheet for Healthcare Providers:  IncredibleEmployment.be  This test is no t yet approved or cleared by the [place] FDA and  has been authorized for detection and/or diagnosis of SARS-CoV-2 by FDA under an Emergency Use Authorization (EUA). This EUA will remain  in effect (meaning this test can be used) for the duration of the COVID-19 declaration under Section 564(b)(1) of the Act, 21 U.S.C.section 360bbb-3(b)(1), unless the authorization is terminated  or revoked sooner.       Influenza Maria Gallicchio by PCR NEGATIVE NEGATIVE Final   Influenza B by PCR NEGATIVE NEGATIVE Final    Comment: (NOTE) The Xpert Xpress SARS-CoV-2/FLU/RSV plus assay is intended as an aid in the diagnosis of influenza from Nasopharyngeal swab specimens and should not be used as Alec Mcphee sole basis for treatment. Nasal washings and aspirates are  unacceptable for Xpert Xpress SARS-CoV-2/FLU/RSV testing.  Fact Sheet for Patients: EntrepreneurPulse.com.au  Fact Sheet for Healthcare Providers: IncredibleEmployment.be  This test is not yet approved or cleared by the [place] FDA and has been authorized for detection and/or diagnosis of SARS-CoV-2 by FDA under an Emergency Use Authorization (EUA). This EUA will remain in effect (meaning this test can be used) for the duration of the COVID-19 declaration under Section 564(b)(1) of the Act, 21 U.S.C. section 360bbb-3(b)(1), unless the authorization is terminated or revoked.  Performed at [facility], [address] [place], [place] [address]   Blood culture (routine x 2)  Status: None (Preliminary result)   Collection Time: 10/29/22  5:30 PM   Specimen: BLOOD  Result Value Ref Range Status   Specimen Description   Final    BLOOD RIGHT ANTECUBITAL Performed at [facility], [address] [place], [place] [address]    Special Requests   Final    Blood Culture adequate volume BOTTLES DRAWN AEROBIC AND ANAEROBIC Performed at [facility], [address], [place], [place] [address]    Culture   Final    NO GROWTH 2 DAYS Performed at [facility], [place] [address], [place], [place] [address]    Report Status PENDING  Incomplete  Blood culture (routine x 2)     Status: None (Preliminary result)   Collection Time: 10/29/22  6:10 PM   Specimen: BLOOD  Result Value Ref Range Status   Specimen Description   Final    BLOOD BLOOD RIGHT FOREARM Performed at [facility], [address] [place], [place] [address]    Special Requests   Final    Blood Culture adequate volume BOTTLES DRAWN AEROBIC ONLY Performed at [facility], [address] [place], [place] [address]    Culture   Final    NO GROWTH 2 DAYS Performed at [facility], [place] [address], [place], [place] [address]    Report Status PENDING  Incomplete  MRSA Next Gen by PCR, Nasal     Status: None   Collection Time: 10/30/22  7:40 AM   Specimen: Nasal Mucosa; Nasal Swab  Result Value Ref Range Status   MRSA by PCR Next Gen NOT DETECTED NOT DETECTED Final    Comment: (NOTE) The GeneXpert MRSA Assay (FDA approved for NASAL specimens only), is one component of Roman Sandall comprehensive MRSA colonization surveillance program. It is not intended to diagnose MRSA infection nor to guide or monitor treatment for MRSA infections. Test performance is not FDA approved in patients less than 35 years old. Performed at [facility], [place] [address], [place], [place] [address]   Body fluid culture w Gram Stain     Status: None (Preliminary result)   Collection Time: 10/30/22  3:51 PM   Specimen: Pleural Fluid  Result Value Ref Range Status   Specimen Description PLEURAL  Final   Special Requests RIGHT LUNG  Final   Gram Stain   Final    NO WBC SEEN NO ORGANISMS SEEN CYTOSPIN SMEAR Performed at [facility], [place] [address], [place],  [address]    Culture PENDING  Incomplete   Report Status PENDING  Incomplete         Radiology Studies: DG CHEST PORT 1 VIEW  Result Date: 10/31/2022 CLINICAL DATA:  Post thoracentesis yesterday, shortness of breath, atrial fibrillation EXAM: PORTABLE CHEST 1 VIEW COMPARISON:  Portable exam 0606 hours compared to 10/30/2022 FINDINGS: Upper normal heart size. Mediastinal contours and pulmonary vascularity normal. Bibasilar atelectasis. Patchy opacity RIGHT base question infiltrate, little changed. Upper lungs clear. No pleural effusion or pneumothorax. 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 IMPRESSION: Bibasilar atelectasis and probable RIGHT basilar infiltrate. Electronically Signed   By: Lavonia Dana [name]   On: 10/31/2022 08:30   DG CHEST PORT 1 VIEW  Result Date:  10/30/2022 CLINICAL DATA:  Congestive heart failure status post thoracentesis EXAM: PORTABLE CHEST 1 VIEW COMPARISON:  Chest radiograph dated 10/29/2022, CTA chest dated 10/29/2022 FINDINGS: Normal lung volumes. Persistent confluence right lower lung opacity. Patchy left  basilar opacity. No definite pleural effusion questionable trace right apical pneumothorax. The heart size and mediastinal contours are within normal limits. The visualized skeletal structures are unremarkable. IMPRESSION: 1. Persistent confluence right lower lung opacity, suspicious for pneumonia. 2. Patchy left basilar opacity, likely atelectasis. 3. Questionable trace right apical pneumothorax. No definite pleural effusion. Recommend attention on follow-up. Electronically Signed   By: Darrin Nipper [name]   On: 10/30/2022 15:58   ECHOCARDIOGRAM COMPLETE  Result Date: 10/30/2022    ECHOCARDIOGRAM REPORT   Patient Name:   [name] Date of Exam: 10/30/2022 Medical Rec #:  [id number]           Height:       67.5 in Accession #:    [id number]          Weight:       171.5 lb Date of Birth:  1941-05-28           BSA:          1.905 m Patient Age:    50 years            BP:           119/92 mmHg Patient Gender: F                   HR:           103 bpm. Exam Location:  Inpatient Procedure: 2D Echo, Color Doppler and Cardiac Doppler Indications:    I48.91* Unspecified atrial fibrillation  History:        Patient has prior history of Echocardiogram examinations, most                 recent 01/10/2016. Arrythmias:Atrial Fibrillation.  Sonographer:    Raquel Sarna Senior RDCS Referring Phys: [phone] Omer Monter CALDWELL [place]  1. Left ventricular ejection fraction, by estimation, is 40 to 45%. The left ventricle has mildly decreased function. The left ventricle demonstrates global hypokinesis. There is moderate asymmetric left ventricular hypertrophy of the basal-septal segment. Left ventricular diastolic parameters are indeterminate.  2. Right  ventricular systolic function is normal. The right ventricular size is normal. There is normal pulmonary artery systolic pressure.  3. Left atrial size was mildly dilated.  4. The mitral valve is abnormal. 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 No evidence of mitral stenosis.  5. The aortic valve was not well visualized. Aortic valve regurgitation is trivial. No aortic stenosis is present.  6. The inferior vena cava is dilated in size with >50% respiratory variability, suggesting right atrial pressure of 8 mmHg. Comparison(s): MR is worse from prior report. FINDINGS  Left Ventricle: Left ventricular ejection fraction, by estimation, is 40 to 45%. The left ventricle has mildly decreased function. The left ventricle demonstrates global hypokinesis. The left ventricular internal cavity size was normal in size. There is  moderate asymmetric left ventricular hypertrophy of the basal-septal segment. Left ventricular diastolic parameters are indeterminate. Right Ventricle: The right ventricular size is normal. No increase in right ventricular wall thickness. Right ventricular systolic function is normal. There is normal pulmonary artery systolic pressure. The tricuspid regurgitant velocity is 2.72 m/s, and  with an assumed right atrial pressure of 3 mmHg, the estimated right ventricular systolic pressure is 00.1 mmHg. Left Atrium: Left atrial size was mildly dilated. Right Atrium: Right atrial size was normal in size. Pericardium: The pericardium was not well visualized. Mitral Valve: MR volume 52. The mitral valve is abnormal. Mild mitral annular calcification. Moderate to severe  mitral valve regurgitation. No evidence of mitral valve stenosis. Tricuspid Valve: The tricuspid valve is not well visualized. Tricuspid valve regurgitation is not demonstrated. No evidence of tricuspid stenosis. Aortic Valve: The aortic valve was not well visualized. Aortic valve regurgitation is trivial. No aortic stenosis is present.  Pulmonic Valve: The pulmonic valve was not well visualized. Pulmonic valve regurgitation is not visualized. No evidence of pulmonic stenosis. Aorta: The aortic root, ascending aorta and aortic arch are all structurally normal, with no evidence of dilitation or obstruction. Venous: The inferior vena cava is dilated in size with greater than 50% respiratory variability, suggesting right atrial pressure of 8 mmHg. IAS/Shunts: No atrial level shunt detected by color flow Doppler.  LEFT VENTRICLE PLAX 2D LVIDd:         4.20 cm LVIDs:         3.50 cm LV PW:         0.90 cm LV IVS:        1.25 cm LVOT diam:     2.10 cm LV SV:         40 LV SV Index:   21 LVOT Area:     3.46 cm  RIGHT VENTRICLE RV S prime:     10.30 cm/s TAPSE (M-mode): 1.3 cm LEFT ATRIUM             Index        RIGHT ATRIUM           Index LA diam:        3.80 cm 1.99 cm/m   RA Area:     13.50 cm LA Vol (A2C):   65.3 ml 34.28 ml/m  RA Volume:   31.30 ml  16.43 ml/m LA Vol (A4C):   76.4 ml 40.11 ml/m LA Biplane Vol: 75.7 ml 39.74 ml/m  AORTIC VALVE LVOT Vmax:   74.00 cm/s LVOT Vmean:  48.700 cm/s LVOT VTI:    0.116 m  AORTA Ao Root diam: 3.30 cm Ao Asc diam:  3.10 cm MR Peak grad:    84.6 mmHg    TRICUSPID VALVE MR Mean grad:    60.0 mmHg    TR Peak grad:   29.6 mmHg MR Vmax:         460.00 cm/s  TR Vmax:        272.00 cm/s MR Vmean:        370.5 cm/s MR PISA:         4.02 cm     SHUNTS MR PISA Eff ROA: 28 mm       Systemic VTI:  0.12 m MR PISA Radius:  0.80 cm      Systemic Diam: 2.10 cm Rudean Haskell MD Electronically signed by Rudean Haskell MD Signature Date/Time: 10/30/2022/10:54:26 AM    Final    CT Angio Chest PE W and/or Wo Contrast  Result Date: 10/29/2022 CLINICAL DATA:  Pulmonary embolism (PE) suspected, low to intermediate prob, positive D-dimer EXAM: CT ANGIOGRAPHY CHEST WITH CONTRAST TECHNIQUE: Multidetector CT imaging of the chest was performed using the standard protocol during bolus administration of intravenous  contrast. Multiplanar CT image reconstructions and MIPs were obtained to evaluate the vascular anatomy. RADIATION DOSE REDUCTION: This exam was performed according to the departmental dose-optimization program which includes automated exposure control, adjustment of the mA and/or kV according to patient size and/or use of iterative reconstruction technique. CONTRAST:  35m OMNIPAQUE IOHEXOL 350 MG/ML SOLN COMPARISON:  None Available. FINDINGS: Cardiovascular: Heart is normal size. Aorta is  normal caliber. Scattered coronary artery and aortic calcifications. No filling defects in the pulmonary arteries to suggest pulmonary emboli. Mediastinum/Nodes: Mildly prominent mediastinal and bilateral hilar lymph nodes. No axillary adenopathy. Trachea and esophagus are unremarkable. Lungs/Pleura: Moderate right pleural effusion. Airspace disease in the right lower lobe compatible with pneumonia. Clustered tree-in-bud nodular densities in the right upper lobe also likely infectious. No confluent opacity on the left. Upper Abdomen: No acute findings Musculoskeletal: Chest wall soft tissues are unremarkable. No acute bony abnormality. Review of the MIP images confirms the above findings. IMPRESSION: Right lower lobe consolidation with moderate right pleural effusion. Tree-in-bud nodular densities in the right upper lobe. Findings compatible with pneumonia. No evidence of pulmonary embolus. Coronary artery disease. Aortic Atherosclerosis (ICD10-I70.0). Electronically Signed   By: Rolm Baptise [name]   On: 10/29/2022 23:43   DG Chest Portable 1 View  Result Date: 10/29/2022 CLINICAL DATA:  Atrial fibrillation, palpitations EXAM: PORTABLE CHEST 1 VIEW COMPARISON:  10/23/2022 FINDINGS: Transverse diameter of heart is slightly increased. There are no signs of alveolar pulmonary edema. There is interval decrease in patchy infiltrates in right parahilar region and right lower lung field. New small patchy infiltrate is seen in the  lateral aspect of left lower lung field. There is no pleural effusion or pneumothorax. IMPRESSION: There is interval decrease in patchy infiltrates in right mid and right lower lung fields suggesting resolving atelectasis/pneumonia. There is new small patchy infiltrate in left lower lung fields suggesting multifocal pneumonia. Electronically Signed   By: Elmer Picker [name]   On: 10/29/2022 16:37        Scheduled Meds:  azithromycin  500 mg Oral Daily   [START ON 11/01/2022] levothyroxine  88 mcg Oral Q0600   metoprolol tartrate  50 mg Oral BID   Continuous Infusions:  cefTRIAXone (ROCEPHIN)  IV Stopped (10/30/22 0934)   heparin 1,000 Units/hr (10/31/22 0317)     LOS: 1 day    Time spent: over 30 min    Fayrene Helper, MD [facility]   To contact the attending provider between 7A-7P or the covering provider during after hours 7P-7A, please log into the web site [email or web address] and access using universal [place] password for that web site. If you do not have the password, please call the hospital operator.  10/31/2022, 8:34 AM

## 2022-10-31 NOTE — TOC Benefit Eligibility Note (Signed)
Patient Teacher, English as a foreign language completed.    The patient is currently admitted and upon discharge could be taking Eliquis.  The current 30 day co-pay is $45.00.   The patient is insured through [employer]

## 2022-10-31 NOTE — Progress Notes (Signed)
PT Cancellation Note  Patient Details Name: Leslie [name] MRN: [id number] DOB: 1941/03/01   Cancelled Treatment:    Reason Eval/Treat Not Completed: Medical issues which prohibited therapy - pt c/o significant nausea (which she attributes to vertigo), noted HR jumping from 118-136 at rest. Will follow up for PT treatment as schedule permits.  Mabeline Caras, PT, DPT Acute Rehabilitation Services  Personal: [facility] Office: [place] 10/31/2022, 1:08 PM

## 2022-10-31 NOTE — Progress Notes (Signed)
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  She noted she had vomiting following the sewage backup approximately 3 days after the blockage occurred in late October 2023.  She also noted night sweats.  She was evaluated by her primary care physician found to have a right-sided pneumonia was treated with oral antibiotics.  She went back to her primary care and was found to be in atrial fibrillation ventricular response and was admitted to the hospital for heparin and diltiazem drip.  CT scan of chest revealed right pleural effusion pulmonary critical care was asked to evaluate.  On ultrasound evaluation she was found to have a free-flowing right pleural effusion.  Plan is for thoracentesis today.  Pertinent  Medical History   Past Medical History:  Diagnosis Date   Arthritis    Chronic kidney disease    "stage 3 kidney failure" improving per pt   Headache    Hypothyroidism    Insomnia    Plantar fasciitis    Stomach ulcer ~2000   Stroke [facility])    TIA X2   2001, jan 2017   Vitamin D deficiency      Significant Hospital Events: Including procedures, antibiotic start and stop dates in addition to other pertinent events     thoracentesis 3 PM 10/30/2022 500 cc of yellow fluid  Interim History / Subjective:  No acute distress thoracentesis  Objective   Blood pressure 103/71, pulse (!) 117, temperature (!) 97.4 F (36.3 C), temperature source Oral, resp. rate 18, height 5' 7.5" (1.715 m), weight 77.8 kg, SpO2 97 %.        Intake/Output Summary (Last 24 hours) at 10/31/2022 8127 Last data filed at 10/31/2022 0831 Gross per 24 hour  Intake 1210.05 ml  Output 200 ml  Net 1010.05 ml   Filed Weights   10/29/22  1547 10/30/22 0016  Weight: 78 kg 77.8 kg    Examination: General: Well-nourished well-developed female no acute distress somewhat depressed today HEENT: MM pink/moist no JVD  Neuro: Grossly intact focal defect CV: Atrial fibrillation with continued rapid trickle response in the 120s PULM: Diminished in the bases GI: soft, bsx4 active  GU: Extremities: warm/dry, voids 1+ edema  Skin: no rashes or lesions Chest x-ray 10/31/2022 no significant change  Latest Reference Range & Units Most Recent  Color, Fluid YELLOW  YELLOW 10/30/22 15:25  Total Nucleated Cell Count, Fluid 0 - 1,000 cu mm 279 10/30/22 15:25  Fluid Type-FCT  PLEURAL R 10/30/22 15:25  Lymphs, Fluid % 81 10/30/22 15:25  Eos, Fluid % 0 10/30/22 15:25  Appearance, Fluid CLEAR  HAZY ! 10/30/22 15:25  Other Cells, Fluid % OTHER CELLS IDENTIFIED AS MESOTHELIAL CELLS 10/30/22 15:25  Neutrophil Count, Fluid 0 - 25 % 2 10/30/22 15:25  Monocyte-Macrophage-Serous Fluid 50 - 90 % 17 (L) 10/30/22 15:25   Resolved Hospital Problem list     Assessment & Plan:  Refractory pneumonia with right pleural effusion free-flowing. 10/30/2022 thoracentesis performed with 500 cc of yellow fluid obtained. Monitor fluid culture and pathology results Heparin drip was remedicated transition to oral agent Continue antimicrobial therapy Repeat chest x-ray 10/31/2022 no significant change Consider repeat CT scan   New onset atrial fibrillation ventricular response Currently on heparin and transition to  oral agent per primary Best Practice (right click and "Reselect all SmartList Selections" daily)   Diet/type: Regular consistency (see orders) DVT prophylaxis: systemic heparin GI prophylaxis: PPI Lines: N/A Foley:  N/A Code Status:  full code Last date of multidisciplinary goals of care discussion [tbd]  Labs   CBC: Recent Labs  Lab 10/29/22 1552 10/30/22 0036 10/31/22 0108  WBC 13.3* 10.8* 14.4*  NEUTROABS 8.8*  --   11.4*  HGB 10.5* 10.6* 9.9*  HCT 33.9* 33.0* 32.9*  MCV 89.9 87.8 91.4  PLT 333 298 427    Basic Metabolic Panel: Recent Labs  Lab 10/29/22 1552 10/30/22 0354 10/31/22 0108  NA 141 140 139  K 4.0 5.1 4.3  CL 110 111 109  CO2 20* 18* 17*  GLUCOSE 123* 179* 134*  BUN 24* 22 29*  CREATININE 1.03* 0.98 1.07*  CALCIUM 9.1 9.0 9.0  MG 2.2  --  2.2  PHOS  --   --  3.3   GFR: Estimated Creatinine Clearance: 45.5 mL/min (A) (by C-G formula based on SCr of 1.07 mg/dL (H)). Recent Labs  Lab 10/29/22 1552 10/30/22 0036 10/31/22 0108  WBC 13.3* 10.8* 14.4*    Liver Function Tests: Recent Labs  Lab 10/31/22 0108  AST 47*  ALT 64*  ALKPHOS 89  BILITOT 0.4  PROT 6.6  ALBUMIN 3.1*   No results for input(s): "LIPASE", "AMYLASE" in the last 168 hours. No results for input(s): "AMMONIA" in the last 168 hours.  ABG No results found for: "PHART", "PCO2ART", "PO2ART", "HCO3", "TCO2", "ACIDBASEDEF", "O2SAT"   Coagulation Profile: No results for input(s): "INR", "PROTIME" in the last 168 hours.  Cardiac Enzymes: No results for input(s): "CKTOTAL", "CKMB", "CKMBINDEX", "TROPONINI" in the last 168 hours.  HbA1C: Hgb A1c MFr Bld  Date/Time Value Ref Range Status  10/30/2022 12:36 AM 6.2 (H) 4.8 - 5.6 % Final    Comment:    (NOTE) Pre diabetes:          5.7%-6.4%  Diabetes:              >6.4%  Glycemic control for   <7.0% adults with diabetes     CBG: No results for input(s): "GLUCAP" in the last 168 hours.   Critical care time: Ferol Luz Keeva Reisen ACNP Acute Care Nurse Practitioner [place] Please consult Amion 10/31/2022, 9:18 AM

## 2022-10-31 NOTE — Discharge Instructions (Addendum)

## 2022-10-31 NOTE — Progress Notes (Signed)
Rounding Note    Patient Name: Leslie [name] Date of Encounter: 10/31/2022  [place] Cardiologist: None   Subjective   Sitting up in bed eating breakfast, family at bedside. Breathing is improved.   Inpatient Medications    Scheduled Meds:  azithromycin  500 mg Oral Daily   vitamin B-12  1,000 mcg Oral Daily   [START ON 11/01/2022] levothyroxine  88 mcg Oral Q0600   metoprolol tartrate  75 mg Oral BID   Continuous Infusions:  cefTRIAXone (ROCEPHIN)  IV Stopped (10/30/22 0934)   heparin 1,000 Units/hr (10/31/22 0317)   PRN Meds: famotidine, guaiFENesin, ipratropium-albuterol, metoprolol tartrate, ondansetron   Vital Signs    Vitals:   10/31/22 0100 10/31/22 0200 10/31/22 0300 10/31/22 0756  BP: 127/73   (!) 119/97  Pulse: 98 100 (!) 152 96  Resp: '18 16 19 18  '$ Temp: (!) 97.4 F (36.3 C)     TempSrc: Oral     SpO2: 97% 99% 98% 97%  Weight:      Height:        Intake/Output Summary (Last 24 hours) at 10/31/2022 0851 Last data filed at 10/31/2022 0831 Gross per 24 hour  Intake 1210.05 ml  Output 200 ml  Net 1010.05 ml      10/30/2022   12:16 AM 10/29/2022    3:47 PM 10/05/2021    8:00 AM  Last 3 Weights  Weight (lbs) 171 lb 8.3 oz 172 lb 172 lb  Weight (kg) 77.8 kg 78.019 kg 78.019 kg      Telemetry    Afib rates in the 100-120s range, some spikes in the 140s - Personally Reviewed  ECG    No new tracing this morning  Physical Exam   GEN: No acute distress.   Neck: No JVD Cardiac: Irreg Irreg, soft systolic murmur LLSB,  no rubs, or gallops.  Respiratory: Diminished RLL GI: Soft, nontender, non-distended  MS: No edema; No deformity. Neuro:  Nonfocal  Psych: Normal affect   Labs    High Sensitivity Troponin:   Recent Labs  Lab 10/29/22 1552 10/29/22 2018  TROPONINIHS 13 11     Chemistry Recent Labs  Lab 10/29/22 1552 10/30/22 0354 10/31/22 0108  NA 141 140 139  K 4.0 5.1 4.3  CL 110 111 109  CO2 20* 18*  17*  GLUCOSE 123* 179* 134*  BUN 24* 22 29*  CREATININE 1.03* 0.98 1.07*  CALCIUM 9.1 9.0 9.0  MG 2.2  --  2.2  PROT  --   --  6.6  ALBUMIN  --   --  3.1*  AST  --   --  47*  ALT  --   --  64*  ALKPHOS  --   --  89  BILITOT  --   --  0.4  GFRNONAA 55* 58* 53*  ANIONGAP '11 11 13    '$ Lipids  Recent Labs  Lab 10/31/22 0108  CHOL 151  TRIG 61  HDL 53  LDLCALC 86  CHOLHDL 2.8    Hematology Recent Labs  Lab 10/29/22 1552 10/30/22 0036 10/31/22 0108  WBC 13.3* 10.8* 14.4*  RBC 3.77* 3.76* 3.60*  HGB 10.5* 10.6* 9.9*  HCT 33.9* 33.0* 32.9*  MCV 89.9 87.8 91.4  MCH 27.9 28.2 27.5  MCHC 31.0 32.1 30.1  RDW 15.2 15.1 15.6*  PLT 333 298 327   Thyroid  Recent Labs  Lab 10/29/22 1552  TSH 2.462  FREET4 1.02    BNPNo results for  input(s): "BNP", "PROBNP" in the last 168 hours.  DDimer  Recent Labs  Lab 10/29/22 2057  DDIMER 2.37*     Radiology    DG CHEST PORT 1 VIEW  Result Date: 10/31/2022 CLINICAL DATA:  Post thoracentesis yesterday, shortness of breath, atrial fibrillation EXAM: PORTABLE CHEST 1 VIEW COMPARISON:  Portable exam 0606 hours compared to 10/30/2022 FINDINGS: Upper normal heart size. Mediastinal contours and pulmonary vascularity normal. Bibasilar atelectasis. Patchy opacity RIGHT base question infiltrate, little changed. Upper lungs clear. No pleural effusion or pneumothorax. 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 IMPRESSION: Bibasilar atelectasis and probable RIGHT basilar infiltrate. Electronically Signed   By: Lavonia Dana [name]   On: 10/31/2022 08:30   DG CHEST PORT 1 VIEW  Result Date: 10/30/2022 CLINICAL DATA:  Congestive heart failure status post thoracentesis EXAM: PORTABLE CHEST 1 VIEW COMPARISON:  Chest radiograph dated 10/29/2022, CTA chest dated 10/29/2022 FINDINGS: Normal lung volumes. Persistent confluence right lower lung opacity. Patchy left basilar opacity. No definite pleural effusion questionable trace  right apical pneumothorax. The heart size and mediastinal contours are within normal limits. The visualized skeletal structures are unremarkable. IMPRESSION: 1. Persistent confluence right lower lung opacity, suspicious for pneumonia. 2. Patchy left basilar opacity, likely atelectasis. 3. Questionable trace right apical pneumothorax. No definite pleural effusion. Recommend attention on follow-up. Electronically Signed   By: Darrin Nipper [name]   On: 10/30/2022 15:58   ECHOCARDIOGRAM COMPLETE  Result Date: 10/30/2022    ECHOCARDIOGRAM REPORT   Patient Name:   Leslie [name] Date of Exam: 10/30/2022 Medical Rec #:  [id number]           Height:       67.5 in Accession #:    [id number]          Weight:       171.5 lb Date of Birth:  October 23, 1941           BSA:          1.905 m Patient Age:    12 years            BP:           119/92 mmHg Patient Gender: F                   HR:           103 bpm. Exam Location:  Inpatient Procedure: 2D Echo, Color Doppler and Cardiac Doppler Indications:    I48.91* Unspecified atrial fibrillation  History:        Patient has prior history of Echocardiogram examinations, most                 recent 01/10/2016. Arrythmias:Atrial Fibrillation.  Sonographer:    Raquel Sarna Senior RDCS Referring Phys: [phone] A CALDWELL [place]  1. Left ventricular ejection fraction, by estimation, is 40 to 45%. The left ventricle has mildly decreased function. The left ventricle demonstrates global hypokinesis. There is moderate asymmetric left ventricular hypertrophy of the basal-septal segment. Left ventricular diastolic parameters are indeterminate.  2. Right ventricular systolic function is normal. The right ventricular size is normal. There is normal pulmonary artery systolic pressure.  3. Left atrial size was mildly dilated.  4. The mitral valve is abnormal. 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 No evidence of mitral stenosis.  5. The aortic valve was not well visualized. Aortic valve  regurgitation is trivial. No aortic stenosis is present.  6. The inferior vena cava is dilated in  size with >50% respiratory variability, suggesting right atrial pressure of 8 mmHg. Comparison(s): MR is worse from prior report. FINDINGS  Left Ventricle: Left ventricular ejection fraction, by estimation, is 40 to 45%. The left ventricle has mildly decreased function. The left ventricle demonstrates global hypokinesis. The left ventricular internal cavity size was normal in size. There is  moderate asymmetric left ventricular hypertrophy of the basal-septal segment. Left ventricular diastolic parameters are indeterminate. Right Ventricle: The right ventricular size is normal. No increase in right ventricular wall thickness. Right ventricular systolic function is normal. There is normal pulmonary artery systolic pressure. The tricuspid regurgitant velocity is 2.72 m/s, and  with an assumed right atrial pressure of 3 mmHg, the estimated right ventricular systolic pressure is 46.5 mmHg. Left Atrium: Left atrial size was mildly dilated. Right Atrium: Right atrial size was normal in size. Pericardium: The pericardium was not well visualized. Mitral Valve: MR volume 52. The mitral valve is abnormal. Mild mitral annular calcification. 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 No evidence of mitral valve stenosis. Tricuspid Valve: The tricuspid valve is not well visualized. Tricuspid valve regurgitation is not demonstrated. No evidence of tricuspid stenosis. Aortic Valve: The aortic valve was not well visualized. Aortic valve regurgitation is trivial. No aortic stenosis is present. Pulmonic Valve: The pulmonic valve was not well visualized. Pulmonic valve regurgitation is not visualized. No evidence of pulmonic stenosis. Aorta: The aortic root, ascending aorta and aortic arch are all structurally normal, with no evidence of dilitation or obstruction. Venous: The inferior vena cava is dilated in size with greater than 50%  respiratory variability, suggesting right atrial pressure of 8 mmHg. IAS/Shunts: No atrial level shunt detected by color flow Doppler.  LEFT VENTRICLE PLAX 2D LVIDd:         4.20 cm LVIDs:         3.50 cm LV PW:         0.90 cm LV IVS:        1.25 cm LVOT diam:     2.10 cm LV SV:         40 LV SV Index:   21 LVOT Area:     3.46 cm  RIGHT VENTRICLE RV S prime:     10.30 cm/s TAPSE (M-mode): 1.3 cm LEFT ATRIUM             Index        RIGHT ATRIUM           Index LA diam:        3.80 cm 1.99 cm/m   RA Area:     13.50 cm LA Vol (A2C):   65.3 ml 34.28 ml/m  RA Volume:   31.30 ml  16.43 ml/m LA Vol (A4C):   76.4 ml 40.11 ml/m LA Biplane Vol: 75.7 ml 39.74 ml/m  AORTIC VALVE LVOT Vmax:   74.00 cm/s LVOT Vmean:  48.700 cm/s LVOT VTI:    0.116 m  AORTA Ao Root diam: 3.30 cm Ao Asc diam:  3.10 cm MR Peak grad:    84.6 mmHg    TRICUSPID VALVE MR Mean grad:    60.0 mmHg    TR Peak grad:   29.6 mmHg MR Vmax:         460.00 cm/s  TR Vmax:        272.00 cm/s MR Vmean:        370.5 cm/s MR PISA:         4.02 cm     SHUNTS  MR PISA Eff ROA: 28 mm       Systemic VTI:  0.12 m MR PISA Radius:  0.80 cm      Systemic Diam: 2.10 cm Rudean Haskell MD Electronically signed by Rudean Haskell MD Signature Date/Time: 10/30/2022/10:54:26 AM    Final    CT Angio Chest PE W and/or Wo Contrast  Result Date: 10/29/2022 CLINICAL DATA:  Pulmonary embolism (PE) suspected, low to intermediate prob, positive D-dimer EXAM: CT ANGIOGRAPHY CHEST WITH CONTRAST TECHNIQUE: Multidetector CT imaging of the chest was performed using the standard protocol during bolus administration of intravenous contrast. Multiplanar CT image reconstructions and MIPs were obtained to evaluate the vascular anatomy. RADIATION DOSE REDUCTION: This exam was performed according to the departmental dose-optimization program which includes automated exposure control, adjustment of the mA and/or kV according to patient size and/or use of iterative  reconstruction technique. CONTRAST:  41m OMNIPAQUE IOHEXOL 350 MG/ML SOLN COMPARISON:  None Available. FINDINGS: Cardiovascular: Heart is normal size. Aorta is normal caliber. Scattered coronary artery and aortic calcifications. No filling defects in the pulmonary arteries to suggest pulmonary emboli. Mediastinum/Nodes: Mildly prominent mediastinal and bilateral hilar lymph nodes. No axillary adenopathy. Trachea and esophagus are unremarkable. Lungs/Pleura: Moderate right pleural effusion. Airspace disease in the right lower lobe compatible with pneumonia. Clustered tree-in-bud nodular densities in the right upper lobe also likely infectious. No confluent opacity on the left. Upper Abdomen: No acute findings Musculoskeletal: Chest wall soft tissues are unremarkable. No acute bony abnormality. Review of the MIP images confirms the above findings. IMPRESSION: Right lower lobe consolidation with moderate right pleural effusion. Tree-in-bud nodular densities in the right upper lobe. Findings compatible with pneumonia. No evidence of pulmonary embolus. Coronary artery disease. Aortic Atherosclerosis (ICD10-I70.0). Electronically Signed   By: KRolm BaptiseM.D.   On: 10/29/2022 23:43   DG Chest Portable 1 View  Result Date: 10/29/2022 CLINICAL DATA:  Atrial fibrillation, palpitations EXAM: PORTABLE CHEST 1 VIEW COMPARISON:  10/23/2022 FINDINGS: Transverse diameter of heart is slightly increased. There are no signs of alveolar pulmonary edema. There is interval decrease in patchy infiltrates in right parahilar region and right lower lung field. New small patchy infiltrate is seen in the lateral aspect of left lower lung field. There is no pleural effusion or pneumothorax. IMPRESSION: There is interval decrease in patchy infiltrates in right mid and right lower lung fields suggesting resolving atelectasis/pneumonia. There is new small patchy infiltrate in left lower lung fields suggesting multifocal pneumonia.  Electronically Signed   By: PElmer PickerM.D.   On: 10/29/2022 16:37    Cardiac Studies   Echo: 10/30/22   IMPRESSIONS     1. Left ventricular ejection fraction, by estimation, is 40 to 45%. The  left ventricle has mildly decreased function. The left ventricle  demonstrates global hypokinesis. There is moderate asymmetric left  ventricular hypertrophy of the basal-septal  segment. Left ventricular diastolic parameters are indeterminate.   2. Right ventricular systolic function is normal. The right ventricular  size is normal. There is normal pulmonary artery systolic pressure.   3. Left atrial size was mildly dilated.   4. The mitral valve is abnormal. Moderate to severe mitral valve  regurgitation. No evidence of mitral stenosis.   5. The aortic valve was not well visualized. Aortic valve regurgitation  is trivial. No aortic stenosis is present.   6. The inferior vena cava is dilated in size with >50% respiratory  variability, suggesting right atrial pressure of 8 mmHg.   Comparison(s): MR is  worse from prior report.   FINDINGS   Left Ventricle: Left ventricular ejection fraction, by estimation, is 40  to 45%. The left ventricle has mildly decreased function. The left  ventricle demonstrates global hypokinesis. The left ventricular internal  cavity size was normal in size. There is   moderate asymmetric left ventricular hypertrophy of the basal-septal  segment. Left ventricular diastolic parameters are indeterminate.   Right Ventricle: The right ventricular size is normal. No increase in  right ventricular wall thickness. Right ventricular systolic function is  normal. There is normal pulmonary artery systolic pressure. The tricuspid  regurgitant velocity is 2.72 m/s, and   with an assumed right atrial pressure of 3 mmHg, the estimated right  ventricular systolic pressure is 01.7 mmHg.   Left Atrium: Left atrial size was mildly dilated.   Right Atrium: Right atrial  size was normal in size.   Pericardium: The pericardium was not well visualized.   Mitral Valve: MR volume 52. The mitral valve is abnormal. Mild mitral  annular calcification. 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 No  evidence of mitral valve stenosis.   Tricuspid Valve: The tricuspid valve is not well visualized. Tricuspid  valve regurgitation is not demonstrated. No evidence of tricuspid  stenosis.   Aortic Valve: The aortic valve was not well visualized. Aortic valve  regurgitation is trivial. No aortic stenosis is present.   Pulmonic Valve: The pulmonic valve was not well visualized. Pulmonic valve  regurgitation is not visualized. No evidence of pulmonic stenosis.   Aorta: The aortic root, ascending aorta and aortic arch are all  structurally normal, with no evidence of dilitation or obstruction.   Venous: The inferior vena cava is dilated in size with greater than 50%  respiratory variability, suggesting right atrial pressure of 8 mmHg.   IAS/Shunts: No atrial level shunt detected by color flow Doppler.     Patient Profile     81 y.o. female with a hx of hypothyroidism, GERD, TIA who is being seen 10/30/2022 for the evaluation of new onset afib at the request of Dr. Florene Glen.   Assessment & Plan    New onset Afib RVR -- denies any palpitations prior to admission, has been short of breath with recent PNA dx -- started on IV Diltiazem with improved rates, transitioned to metoprolol '50mg'$  BID. Rates still elevated, metoprolol further increased '75mg'$  BID  -- on IV heparin, if no further invasive procedures planned would transition to Eliquis '5mg'$  BID   PNA Right pleural effusion -- has been on outpatient antibiotics per PCP -- CT angio with right sided pleural effusion/consolidation  -- s/p thoracentesis, 500 cc's out. 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 LVEF reads at 40-45% -- given her going afib RVR, would ideally repeat echo once back in SR to determine true degree of MR along with LV function.     Hypothyroidism -- TSH 2.462, T4 1.02 -- PTA synthroid  Elevated LFTs -- AST 47, ALT 64   Pre DM -- Hgb A1c 6.2   For questions or updates, please contact [place] Please consult [email or web address] for contact info under        Signed, Reino Bellis, NP  10/31/2022, 8:51 AM

## 2022-10-31 NOTE — Progress Notes (Signed)
ANTICOAGULATION CONSULT NOTE  Pharmacy Consult for heparin infusion  Indication: atrial fibrillation  Allergies  Allergen Reactions   Duloxetine Nausea Only   Voltaren [Diclofenac Sodium] Shortness Of Breath and Palpitations   Lisinopril Other (See Comments)   Aspirin Other (See Comments)    ulcers   Diphenhydramine Other (See Comments)   Gabapentin Itching   Ultram [Tramadol Hcl] Other (See Comments)    Effects cognitive abilities    Proton Pump Inhibitors Rash    Itching, rash    Patient Measurements: Height: 5' 7.5" (171.5 cm) Weight: 77.8 kg (171 lb 8.3 oz) IBW/kg (Calculated) : 62.75 Heparin Dosing Weight: 77.3kg   Vital Signs: Temp: 97.4 F (36.3 C) (11/22 0100) Temp Source: Oral (11/22 0100) BP: 127/73 (11/22 0100) Pulse Rate: 98 (11/22 0100)  Labs: Recent Labs    10/29/22 1552 10/29/22 2018 10/30/22 0036 10/30/22 0354 10/31/22 0108  HGB 10.5*  --  10.6*  --  9.9*  HCT 33.9*  --  33.0*  --  32.9*  PLT 333  --  298  --  327  HEPARINUNFRC  --   --  0.39  --  <0.10*  CREATININE 1.03*  --   --  0.98 1.07*  TROPONINIHS 13 11  --   --   --      Estimated Creatinine Clearance: 45.5 mL/min (A) (by C-G formula based on SCr of 1.07 mg/dL (H)).   Medical History: Past Medical History:  Diagnosis Date   Arthritis    Chronic kidney disease    "stage 3 kidney failure" improving per pt   Headache    Hypothyroidism    Insomnia    Plantar fasciitis    Stomach ulcer ~2000   Stroke [facility])    TIA X2   2001, jan 2017   Vitamin D deficiency     Assessment: 81 yo F with new afib with RVR  -  no anticoagulants on recent fills. Was previously on eliquis 2.5 mg BID in 2022.   Hgb 10.5/Plt 333 - stable   11/22 AM update:  Heparin was held yesterday for thoracentesis Ok to re-start now per Dr. Oletta Darter  Goal of Therapy:  Heparin level 0.3-0.7 units/ml Monitor platelets by anticoagulation protocol: Yes   Plan:  Re-start heparin drip at 1000 units/hr 1200  heparin level  Narda Bonds, PharmD, BCPS Clinical Pharmacist Phone: [phone]

## 2022-11-01 DIAGNOSIS — I5022 Chronic systolic (congestive) heart failure: Secondary | ICD-10-CM

## 2022-11-01 DIAGNOSIS — I4891 Unspecified atrial fibrillation: Secondary | ICD-10-CM | POA: Diagnosis not present

## 2022-11-01 DIAGNOSIS — I340 Nonrheumatic mitral (valve) insufficiency: Secondary | ICD-10-CM | POA: Diagnosis not present

## 2022-11-01 LAB — BRAIN NATRIURETIC PEPTIDE: B Natriuretic Peptide: 791.8 pg/mL — ABNORMAL HIGH (ref 0.0–100.0)

## 2022-11-01 LAB — COMPREHENSIVE METABOLIC PANEL
ALT: 63 U/L — ABNORMAL HIGH (ref 0–44)
AST: 36 U/L (ref 15–41)
Albumin: 3.1 g/dL — ABNORMAL LOW (ref 3.5–5.0)
Alkaline Phosphatase: 84 U/L (ref 38–126)
Anion gap: 11 (ref 5–15)
BUN: 32 mg/dL — ABNORMAL HIGH (ref 8–23)
CO2: 25 mmol/L (ref 22–32)
Calcium: 8.8 mg/dL — ABNORMAL LOW (ref 8.9–10.3)
Chloride: 101 mmol/L (ref 98–111)
Creatinine, Ser: 1.26 mg/dL — ABNORMAL HIGH (ref 0.44–1.00)
GFR, Estimated: 43 mL/min — ABNORMAL LOW (ref >60)
Glucose, Bld: 151 mg/dL — ABNORMAL HIGH (ref 70–99)
Potassium: 3.7 mmol/L (ref 3.5–5.1)
Sodium: 137 mmol/L (ref 135–145)
Total Bilirubin: 0.4 mg/dL (ref 0.3–1.2)
Total Protein: 6.7 g/dL (ref 6.5–8.1)

## 2022-11-01 LAB — CBC WITH DIFFERENTIAL/PLATELET
Abs Immature Granulocytes: 0.07 10*3/uL (ref 0.00–0.07)
Basophils Absolute: 0 10*3/uL (ref 0.0–0.1)
Basophils Relative: 0 %
Eosinophils Absolute: 0.1 10*3/uL (ref 0.0–0.5)
Eosinophils Relative: 1 %
HCT: 36.2 % (ref 36.0–46.0)
Hemoglobin: 11.3 g/dL — ABNORMAL LOW (ref 12.0–15.0)
Immature Granulocytes: 0 %
Lymphocytes Relative: 22 %
Lymphs Abs: 3.7 10*3/uL (ref 0.7–4.0)
MCH: 27.7 pg (ref 26.0–34.0)
MCHC: 31.2 g/dL (ref 30.0–36.0)
MCV: 88.7 fL (ref 80.0–100.0)
Monocytes Absolute: 1.5 10*3/uL — ABNORMAL HIGH (ref 0.1–1.0)
Monocytes Relative: 9 %
Neutro Abs: 11.7 10*3/uL — ABNORMAL HIGH (ref 1.7–7.7)
Neutrophils Relative %: 68 %
Platelets: 328 10*3/uL (ref 150–400)
RBC: 4.08 MIL/uL (ref 3.87–5.11)
RDW: 15.6 % — ABNORMAL HIGH (ref 11.5–15.5)
WBC: 17 10*3/uL — ABNORMAL HIGH (ref 4.0–10.5)
nRBC: 0 % (ref 0.0–0.2)

## 2022-11-01 LAB — MAGNESIUM: Magnesium: 2.3 mg/dL (ref 1.7–2.4)

## 2022-11-01 LAB — PHOSPHORUS: Phosphorus: 3.5 mg/dL (ref 2.5–4.6)

## 2022-11-01 LAB — PATHOLOGIST SMEAR REVIEW

## 2022-11-01 MED ORDER — DILTIAZEM HCL 60 MG PO TABS
60.0000 mg | ORAL_TABLET | Freq: Four times a day (QID) | ORAL | Status: DC
  Administered 2022-11-01 – 2022-11-02 (×4): 60 mg via ORAL
  Filled 2022-11-01 (×4): qty 1

## 2022-11-01 NOTE — Progress Notes (Signed)
PROGRESS NOTE    Leslie [name]  QQI:[id number] DOB: 1941-08-27 DOA: 10/29/2022 PCP: Sonia Side., FNP  Chief Complaint  Patient presents with   new onset atrial fib    Brief Narrative:  This is Leslie [name] 81 year old female with past medical history of hypothyroidism and GERD.  In October, the patient had [name] series of unfortunate events.  Leslie [name] bathroom blocked up, [employer] came and tore Leslie [name] floors up.  The smells irritates Leslie [name] respiratory passages and caused nausea and vomiting.  She went on to develop [name] hacking nonproductive cough.  She saw Leslie [name] PCP, he started on 2 antibiotics and p.o. steroids.  She states she did not get better.  Additionally, she developed palpitations that were worse at night.  She was short of breath, and had Leslie [name] mild wheeze.  She denies fever, chills.  She endorse some lightheadedness and dizziness and was very weak.  She had no appetite..  She returned to Leslie [name] PCP today where EKG was done revealing Leslie [name]-fib with RVR.  The patient was directed to the ER.  Patient has no prior history of atrial fibrillation.   Patient transferred from freestanding ER.  History provided by the patient  Assessment & Plan:   Principal Problem:   Atrial fibrillation with rapid ventricular response ([name]) Active Problems:   Community acquired pneumonia of left lower lobe of lung   Pleural effusion  Acute Pulmonary Edema  HFmrEF with Exacberbation  Mitral Valve Regurgitation Suspect related to RVR Clinically doesn't look that overloaded, but does have bibasilar crackles.  CXR 11/22 with increased bilateral interstitial opacities, new bilateral effusions.  Stable R basilar opacity. Had acute SOB 11/22 and orthopnea related to this, improved with lasix Continue lasix 40 mg daily IV Strict I/O, daily weights  Appreciate cardiology recommendations  Atrial fibrillation with RVR New onset in the setting of pneumonia Continued RVR today Chadsvasc 6 (age, sex, HF, hx TIA) eliquis TSH  wnl Echo with mildly decreased EF 40-45%, moderate asymmetric LVH, moderate to severe MVR Appreciate cardiology assistance -> they're stopping amiodarone and metoprolol, planning to transition to diltiazem PO.  Consider outpatient cardioversion.  Community Acquired Pneumonia  Right Sided Pleural Effusion  Leukocytosis She was treated outpatient with courses of cefdinir and azithromycin WBC count rising today, clinically improving, follow closely CT showing R lower lobe consolidation with Leslie [name] moderate right pleural effusion, tree in bud nodular densities in the right upper lobe CXR 11/22 with bibasilar atelectasis and probable right basilar infiltrate Currently on azithromycin and ceftriaxone, will continue Follow MRSA PCR (negative), sputum cx if able, urine strep (negative), urine legionella (pending) Blood cultures NGTD x2 S/p thoracentesis on 11/21 -> post procedure had questionable apical pneumothorax, resolved on 11/22 CXR Effusion transudative per lights criteria -> gram stain without WBC or organisms, follow culture.  Follow cytology.  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 Left ventricular ejection fraction, by estimation, is 40 to 45%. The  left ventricle has mildly decreased function. The left ventricle  demonstrates global hypokinesis. There is moderate asymmetric left  ventricular  hypertrophy of the basal-septal  segment. Left ventricular diastolic parameters are indeterminate.   2. Right ventricular systolic function is normal. The right ventricular  size is normal. There is normal pulmonary artery systolic pressure.   3. Left atrial size was mildly dilated.   4. The mitral valve is abnormal. Moderate to severe mitral valve  regurgitation. No evidence of mitral stenosis.   5. The aortic valve was not well visualized. Aortic valve regurgitation  is trivial. No aortic stenosis is present.   6. The inferior vena cava is dilated in size with >50% respiratory  variability, suggesting right atrial pressure of 8 mmHg.   Comparison(s): MR is worse from prior report.   Antimicrobials:  Anti-infectives (From admission, onward)    Start     Dose/Rate Route Frequency Ordered Stop   10/30/22 1000  cefTRIAXone (ROCEPHIN) 2 g in sodium chloride 0.9 % 100 mL IVPB        2 g 200 mL/hr over 30 Minutes Intravenous Every 24 hours 10/30/22 0222 11/04/22 0959   10/30/22 1000  azithromycin (ZITHROMAX) tablet 500 mg        500 mg Oral Daily 10/30/22 0222 11/04/22 0959   10/29/22 1715  cefTRIAXone (ROCEPHIN) 1 g in sodium chloride 0.9 % 100 mL IVPB        1 g 200 mL/hr over 30 Minutes Intravenous  Once 10/29/22 1705 10/29/22 1910   10/29/22 1715  azithromycin (ZITHROMAX) 500 mg in sodium chloride 0.9 % 250 mL IVPB        500 mg 250 mL/hr over 60 Minutes Intravenous  Once 10/29/22 1705 10/29/22 2022       Subjective: Feels better this morning No new complaints  Objective: Vitals:   10/31/22 2000 10/31/22 2132 11/01/22 0050 11/01/22 0749  BP: 99/71 111/85 109/84 (!) 116/94  Pulse:  (!) 111 99 80  Resp: '20  19 20  '$ Temp: 98 F (36.7 C)  98 F (36.7 C) (!) 97.4 F (36.3 C)  TempSrc: Oral  Oral Oral  SpO2: 100%  100% 97%  Weight:   75.6 kg   Height:        Intake/Output Summary (Last 24 hours) at 11/01/2022 0842 Last data filed at 11/01/2022 0058 Gross per 24 hour   Intake 100 ml  Output 2650 ml  Net -2550 ml   Filed Weights   10/29/22 1547 10/30/22 0016 11/01/22 0050  Weight: 78 kg 77.8 kg 75.6 kg    Examination:  General: No acute distress. Cardiovascular: irregularly irregular, tachycardic Lungs: bibasilar crackles Abdomen: Soft, nontender, nondistended Neurological: Alert and oriented 3. Moves all extremities 4 with equal strength. Cranial nerves II through XII grossly intact. Extremities: no LEE  Data Reviewed: I have personally reviewed following labs and imaging studies  CBC: Recent Labs  Lab 10/29/22 1552 10/30/22 0036 10/31/22 0108 11/01/22 0038  WBC 13.3* 10.8* 14.4* 17.0*  NEUTROABS 8.8*  --  11.4* 11.7*  HGB 10.5* 10.6* 9.9* 11.3*  HCT 33.9* 33.0* 32.9* 36.2  MCV 89.9 87.8 91.4 88.7  PLT 333 298 327 176    Basic Metabolic Panel: Recent Labs  Lab 10/29/22 1552 10/30/22 0354 10/31/22 0108 11/01/22 0038  NA 141 140 139 137  K 4.0 5.1 4.3 3.7  CL 110 111 109 101  CO2 20* 18* 17* 25  GLUCOSE 123* 179* 134* 151*  BUN 24* 22 29* 32*  CREATININE 1.03* 0.98 1.07* 1.26*  CALCIUM 9.1  9.0 9.0 8.8*  MG 2.2  --  2.2 2.3  PHOS  --   --  3.3 3.5    GFR: Estimated Creatinine Clearance: 38.2 mL/min (Gianlucca Szymborski) (by C-G formula based on SCr of 1.26 mg/dL (H)).  Liver Function Tests: Recent Labs  Lab 10/31/22 0108 11/01/22 0038  AST 47* 36  ALT 64* 63*  ALKPHOS 89 84  BILITOT 0.4 0.4  PROT 6.6 6.7  ALBUMIN 3.1* 3.1*    CBG: No results for input(s): "GLUCAP" in the last 168 hours.   Recent Results (from the past 240 hour(s))  Resp Panel by RT-PCR (Flu Darryle Dennie&B, Covid) Anterior Nasal Swab     Status: None   Collection Time: 10/29/22  4:36 PM   Specimen: Anterior Nasal Swab  Result Value Ref Range Status   SARS Coronavirus 2 by RT PCR NEGATIVE NEGATIVE Final    Comment: (NOTE) SARS-CoV-2 target nucleic acids are NOT DETECTED.  The SARS-CoV-2 RNA is generally detectable in upper respiratory specimens during the acute  phase of infection. The lowest concentration of SARS-CoV-2 viral copies this assay can detect is 138 copies/mL. Tiffay Pinette negative result does not preclude SARS-Cov-2 infection and should not be used as the sole basis for treatment or other patient management decisions. Stasia Somero negative result may occur with  improper specimen collection/handling, submission of specimen other than nasopharyngeal swab, presence of viral mutation(s) within the areas targeted by this assay, and inadequate number of viral copies(<138 copies/mL). Shantara Goosby negative result must be combined with clinical observations, patient history, and epidemiological information. The expected result is Negative.  Fact Sheet for Patients:  EntrepreneurPulse.com.au  Fact Sheet for Healthcare Providers:  IncredibleEmployment.be  This test is no t yet approved or cleared by the [place] FDA and  has been authorized for detection and/or diagnosis of SARS-CoV-2 by FDA under an Emergency Use Authorization (EUA). This EUA will remain  in effect (meaning this test can be used) for the duration of the COVID-19 declaration under Section 564(b)(1) of the Act, 21 U.S.C.section 360bbb-3(b)(1), unless the authorization is terminated  or revoked sooner.       Influenza Nallely Yost by PCR NEGATIVE NEGATIVE Final   Influenza B by PCR NEGATIVE NEGATIVE Final    Comment: (NOTE) The Xpert Xpress SARS-CoV-2/FLU/RSV plus assay is intended as an aid in the diagnosis of influenza from Nasopharyngeal swab specimens and should not be used as Malcolm Quast sole basis for treatment. Nasal washings and aspirates are unacceptable for Xpert Xpress SARS-CoV-2/FLU/RSV testing.  Fact Sheet for Patients: EntrepreneurPulse.com.au  Fact Sheet for Healthcare Providers: IncredibleEmployment.be  This test is not yet approved or cleared by the [place] FDA and has been authorized for detection and/or diagnosis of  SARS-CoV-2 by FDA under an Emergency Use Authorization (EUA). This EUA will remain in effect (meaning this test can be used) for the duration of the COVID-19 declaration under Section 564(b)(1) of the Act, 21 U.S.C. section 360bbb-3(b)(1), unless the authorization is terminated or revoked.  Performed at [facility], [address] [place], [place] [address]   Blood culture (routine x 2)     Status: None (Preliminary result)   Collection Time: 10/29/22  5:30 PM   Specimen: BLOOD  Result Value Ref Range Status   Specimen Description   Final    BLOOD RIGHT ANTECUBITAL Performed at [facility], [address] [place], [place] [address]    Special Requests   Final    Blood Culture adequate volume BOTTLES DRAWN AEROBIC AND ANAEROBIC Performed  at [facility], [address] [place], [place] [address]    Culture   Final    NO GROWTH 2 DAYS Performed at [facility], [place] [address], [place], [place] [address]    Report Status PENDING  Incomplete  Blood culture (routine x 2)     Status: None (Preliminary result)   Collection Time: 10/29/22  6:10 PM   Specimen: BLOOD  Result Value Ref Range Status   Specimen Description   Final    BLOOD BLOOD RIGHT FOREARM Performed at [facility], [address] [place], [place] [address]    Special Requests   Final    Blood Culture adequate volume BOTTLES DRAWN AEROBIC ONLY Performed at [facility], [address] [place], [place] [address]    Culture   Final    NO GROWTH 2 DAYS Performed at [facility], [place] [address], [place], [place] [address]    Report Status PENDING  Incomplete  MRSA Next Gen by PCR, Nasal     Status: None   Collection Time: 10/30/22  7:40 AM   Specimen: Nasal Mucosa; Nasal Swab  Result Value Ref Range Status   MRSA by PCR Next Gen NOT DETECTED NOT DETECTED Final    Comment: (NOTE) The GeneXpert MRSA  Assay (FDA approved for NASAL specimens only), is one component of [facility] [place] comprehensive MRSA colonization surveillance program. It is not intended to diagnose MRSA infection nor to guide or monitor treatment for MRSA infections. Test performance is not FDA approved in patients less than 29 years old. Performed at [facility], [place] [address] [place], [place] [address]   Body fluid culture w Gram Stain     Status: None (Preliminary result)   Collection Time: 10/30/22  3:51 PM   Specimen: Pleural Fluid  Result Value Ref Range Status   Specimen Description PLEURAL  Final   Special Requests RIGHT LUNG  Final   Gram Stain NO WBC SEEN NO ORGANISMS SEEN CYTOSPIN SMEAR   Final   Culture   Final    NO GROWTH < 24 HOURS Performed at [facility], [place] [address] [place], [place] [address]    Report Status PENDING  Incomplete         Radiology Studies: DG CHEST PORT 1 VIEW  Result Date: 10/31/2022 CLINICAL DATA:  Shortness of breath EXAM: PORTABLE CHEST 1 VIEW COMPARISON:  Chest x-ray dated October 31, 2022 FINDINGS: Cardiac and mediastinal contours are unchanged. Increased bilateral interstitial opacities. Similar right basilar opacity. Probable new small bilateral pleural effusions. No evidence of pneumothorax IMPRESSION: 1. Slightly increased bilateral interstitial opacities, likely due to worsened pulmonary edema. 2. Similar right basilar opacity which may be due to infection or aspiration. 3. Probable new small bilateral pleural effusions. Electronically Signed   By: Yetta Glassman [name]   On: 10/31/2022 15:57   DG CHEST PORT 1 VIEW  Result Date: 10/31/2022 CLINICAL DATA:  Post thoracentesis yesterday, shortness of breath, atrial fibrillation EXAM: PORTABLE CHEST 1 VIEW COMPARISON:  Portable exam 0606 hours compared to 10/30/2022 FINDINGS: Upper normal heart size. Mediastinal contours and pulmonary vascularity normal. Bibasilar atelectasis. Patchy opacity RIGHT base question  infiltrate, little changed. Upper lungs clear. No pleural effusion or pneumothorax. 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 IMPRESSION: Bibasilar atelectasis and probable RIGHT basilar infiltrate. Electronically Signed   By: Lavonia Dana [name]   On: 10/31/2022 08:30   DG CHEST PORT 1 VIEW  Result Date: 10/30/2022 CLINICAL DATA:  Congestive heart failure status post thoracentesis EXAM: PORTABLE CHEST 1 VIEW COMPARISON:  Chest radiograph dated 10/29/2022, CTA chest dated 10/29/2022 FINDINGS: Normal lung volumes. Persistent confluence right lower lung opacity. Patchy left basilar opacity. No definite pleural effusion questionable trace right apical pneumothorax. The heart size and mediastinal contours are within normal limits. The visualized skeletal structures are unremarkable. IMPRESSION: 1. Persistent confluence right lower lung opacity, suspicious for pneumonia. 2. Patchy left basilar opacity, likely atelectasis. 3. Questionable trace right apical pneumothorax. No definite pleural effusion. Recommend attention on follow-up. Electronically Signed   By: Darrin Nipper [name]   On: 10/30/2022 15:58   ECHOCARDIOGRAM COMPLETE  Result Date: 10/30/2022    ECHOCARDIOGRAM REPORT   Patient Name:   [name] Date of Exam: 10/30/2022 Medical Rec #:  [id number]           Height:       67.5 in Accession #:    [id number]          Weight:       171.5 lb Date of Birth:  Jan 22, 1941           BSA:          1.905 m Patient Age:    69 years            BP:           119/92 mmHg Patient Gender: F                   HR:           103 bpm. Exam Location:  Inpatient Procedure: 2D Echo, Color Doppler and Cardiac Doppler Indications:    I48.91* Unspecified atrial fibrillation  History:        Patient has prior history of Echocardiogram examinations, most                 recent 01/10/2016. Arrythmias:Atrial Fibrillation.  Sonographer:    Raquel Sarna Senior RDCS Referring Phys: [phone] Meridee Branum CALDWELL [place]  1. Left ventricular ejection fraction, by estimation, is 40 to 45%. The left ventricle has mildly decreased function. The left ventricle demonstrates global hypokinesis. There is moderate asymmetric left ventricular hypertrophy of the basal-septal segment. Left ventricular diastolic parameters are indeterminate.  2. Right ventricular systolic function is normal. The right ventricular size is normal. There is normal pulmonary artery systolic pressure.  3. Left atrial size was mildly dilated.  4. The mitral valve is abnormal. 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 No evidence of mitral stenosis.  5. The aortic valve was not well visualized. Aortic valve regurgitation is trivial. No aortic stenosis is present.  6. The inferior vena cava is dilated in size with >50% respiratory variability, suggesting right atrial pressure of 8 mmHg. Comparison(s): MR is worse from prior report. FINDINGS  Left Ventricle: Left ventricular ejection fraction, by estimation, is 40 to 45%. The left ventricle has mildly decreased function. The left ventricle demonstrates global hypokinesis. The left ventricular internal cavity size was normal in size. There is  moderate asymmetric left ventricular hypertrophy of the basal-septal segment. Left ventricular diastolic parameters are indeterminate. Right Ventricle: The right ventricular size is normal. No increase in right ventricular wall thickness. Right ventricular systolic function is normal. There is normal pulmonary artery systolic pressure. The tricuspid regurgitant velocity is 2.72 m/s, and  with an assumed right atrial pressure of 3 mmHg, the estimated right ventricular systolic pressure is 07.6 mmHg. Left Atrium:  Left atrial size was mildly dilated. Right Atrium: Right atrial size was normal in size. Pericardium: The pericardium was not well visualized. Mitral Valve: MR volume 52. The mitral valve is abnormal. Mild mitral annular calcification. Moderate to severe  mitral valve regurgitation. No evidence of mitral valve stenosis. Tricuspid Valve: The tricuspid valve is not well visualized. Tricuspid valve regurgitation is not demonstrated. No evidence of tricuspid stenosis. Aortic Valve: The aortic valve was not well visualized. Aortic valve regurgitation is trivial. No aortic stenosis is present. Pulmonic Valve: The pulmonic valve was not well visualized. Pulmonic valve regurgitation is not visualized. No evidence of pulmonic stenosis. Aorta: The aortic root, ascending aorta and aortic arch are all structurally normal, with no evidence of dilitation or obstruction. Venous: The inferior vena cava is dilated in size with greater than 50% respiratory variability, suggesting right atrial pressure of 8 mmHg. IAS/Shunts: No atrial level shunt detected by color flow Doppler.  LEFT VENTRICLE PLAX 2D LVIDd:         4.20 cm LVIDs:         3.50 cm LV PW:         0.90 cm LV IVS:        1.25 cm LVOT diam:     2.10 cm LV SV:         40 LV SV Index:   21 LVOT Area:     3.46 cm  RIGHT VENTRICLE RV S prime:     10.30 cm/s TAPSE (M-mode): 1.3 cm LEFT ATRIUM             Index        RIGHT ATRIUM           Index LA diam:        3.80 cm 1.99 cm/m   RA Area:     13.50 cm LA Vol (A2C):   65.3 ml 34.28 ml/m  RA Volume:   31.30 ml  16.43 ml/m LA Vol (A4C):   76.4 ml 40.11 ml/m LA Biplane Vol: 75.7 ml 39.74 ml/m  AORTIC VALVE LVOT Vmax:   74.00 cm/s LVOT Vmean:  48.700 cm/s LVOT VTI:    0.116 m  AORTA Ao Root diam: 3.30 cm Ao Asc diam:  3.10 cm MR Peak grad:    84.6 mmHg    TRICUSPID VALVE MR Mean grad:    60.0 mmHg    TR Peak grad:   29.6 mmHg MR Vmax:         460.00 cm/s  TR Vmax:        272.00 cm/s MR Vmean:        370.5 cm/s MR PISA:         4.02 cm     SHUNTS MR PISA Eff ROA: 28 mm       Systemic VTI:  0.12 m MR PISA Radius:  0.80 cm      Systemic Diam: 2.10 cm Rudean Haskell MD Electronically signed by Rudean Haskell MD Signature Date/Time: 10/30/2022/10:54:26 AM    Final          Scheduled Meds:  apixaban  5 mg Oral BID   azithromycin  500 mg Oral Daily   vitamin B-12  1,000 mcg Oral Daily   diltiazem  60 mg Oral Q6H   furosemide  40 mg Intravenous Daily   levothyroxine  88 mcg Oral Q0600   Continuous Infusions:  cefTRIAXone (ROCEPHIN)  IV Stopped (10/31/22 0949)     LOS: 2 days    Time  spent: over 30 min    Fayrene Helper, MD [facility]   To contact the attending provider between 7A-7P or the covering provider during after hours 7P-7A, please log into the web site [email or web address] and access using universal [place] password for that web site. If you do not have the password, please call the hospital operator.  11/01/2022, 8:42 AM

## 2022-11-01 NOTE — Progress Notes (Signed)
Progress Note  Patient Name: Leslie [name] Date of Encounter: 11/01/2022  Primary Cardiologist: Peter Martinique, MD  Subjective   Patient reports less sense of palpitations, but has had intermittent shortness of breath and is concerned about possible side effects from metoprolol she relates some of her symptoms to an hour after taking the dose.  No chest pain.  Inpatient Medications    Scheduled Meds:  apixaban  5 mg Oral BID   azithromycin  500 mg Oral Daily   vitamin B-12  1,000 mcg Oral Daily   diltiazem  60 mg Oral Q6H   furosemide  40 mg Intravenous Daily   levothyroxine  88 mcg Oral Q0600   Continuous Infusions:  cefTRIAXone (ROCEPHIN)  IV Stopped (10/31/22 0949)   PRN Meds: famotidine, guaiFENesin, ipratropium-albuterol, melatonin, ondansetron   Vital Signs    Vitals:   10/31/22 2000 10/31/22 2132 11/01/22 0050 11/01/22 0749  BP: 99/71 111/85 109/84 (!) 116/94  Pulse:  (!) 111 99 80  Resp: '20  19 20  '$ Temp: 98 F (36.7 C)  98 F (36.7 C) (!) 97.4 F (36.3 C)  TempSrc: Oral  Oral Oral  SpO2: 100%  100% 97%  Weight:   75.6 kg   Height:        Intake/Output Summary (Last 24 hours) at 11/01/2022 0807 Last data filed at 11/01/2022 0058 Gross per 24 hour  Intake 220 ml  Output 2650 ml  Net -2430 ml   Filed Weights   10/29/22 1547 10/30/22 0016 11/01/22 0050  Weight: 78 kg 77.8 kg 75.6 kg    Telemetry    Atrial fibrillation at 110 bpm.  Personally reviewed.  ECG    An ECG dated 10/29/2022 was personally reviewed today and demonstrated:  Atrial fibrillation with RVR, low voltage.  Physical Exam   GEN: No acute distress.   Neck: No JVD. Cardiac: Irregularly irregular, soft apical systolic murmur, no gallop.  Respiratory: Nonlabored.  Decreased breath sounds at bases. GI: Soft, nontender, bowel sounds present. MS: No edema; No deformity. Neuro:  Nonfocal. Psych: Alert and oriented x 3. Normal affect.  Labs    Chemistry Recent Labs   Lab 10/30/22 0354 10/31/22 0108 11/01/22 0038  NA 140 139 137  K 5.1 4.3 3.7  CL 111 109 101  CO2 18* 17* 25  GLUCOSE 179* 134* 151*  BUN 22 29* 32*  CREATININE 0.98 1.07* 1.26*  CALCIUM 9.0 9.0 8.8*  PROT  --  6.6 6.7  ALBUMIN  --  3.1* 3.1*  AST  --  47* 36  ALT  --  64* 63*  ALKPHOS  --  89 84  BILITOT  --  0.4 0.4  GFRNONAA 58* 53* 43*  ANIONGAP '11 13 11     '$ Hematology Recent Labs  Lab 10/30/22 0036 10/31/22 0108 11/01/22 0038  WBC 10.8* 14.4* 17.0*  RBC 3.76* 3.60* 4.08  HGB 10.6* 9.9* 11.3*  HCT 33.0* 32.9* 36.2  MCV 87.8 91.4 88.7  MCH 28.2 27.5 27.7  MCHC 32.1 30.1 31.2  RDW 15.1 15.6* 15.6*  PLT 298 327 328    Cardiac Enzymes Recent Labs  Lab 10/29/22 1552 10/29/22 2018 10/31/22 1540 10/31/22 1736  TROPONINIHS '13 11 12 14    '$ BNP Recent Labs  Lab 10/31/22 1540 11/01/22 0038  BNP 733.7* 791.8*     DDimer Recent Labs  Lab 10/29/22 2057  DDIMER 2.37*     Radiology    DG CHEST PORT 1 VIEW  Result Date:  10/31/2022 CLINICAL DATA:  Shortness of breath EXAM: PORTABLE CHEST 1 VIEW COMPARISON:  Chest x-ray dated October 31, 2022 FINDINGS: Cardiac and mediastinal contours are unchanged. Increased bilateral interstitial opacities. Similar right basilar opacity. Probable new small bilateral pleural effusions. No evidence of pneumothorax IMPRESSION: 1. Slightly increased bilateral interstitial opacities, likely due to worsened pulmonary edema. 2. Similar right basilar opacity which may be due to infection or aspiration. 3. Probable new small bilateral pleural effusions. Electronically Signed   By: Yetta Glassman [name]   On: 10/31/2022 15:57   DG CHEST PORT 1 VIEW  Result Date: 10/31/2022 CLINICAL DATA:  Post thoracentesis yesterday, shortness of breath, atrial fibrillation EXAM: PORTABLE CHEST 1 VIEW COMPARISON:  Portable exam 0606 hours compared to 10/30/2022 FINDINGS: Upper normal heart size. Mediastinal contours and pulmonary vascularity  normal. Bibasilar atelectasis. Patchy opacity RIGHT base question infiltrate, little changed. Upper lungs clear. No pleural effusion or pneumothorax. 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 IMPRESSION: Bibasilar atelectasis and probable RIGHT basilar infiltrate. Electronically Signed   By: Lavonia Dana [name]   On: 10/31/2022 08:30   DG CHEST PORT 1 VIEW  Result Date: 10/30/2022 CLINICAL DATA:  Congestive heart failure status post thoracentesis EXAM: PORTABLE CHEST 1 VIEW COMPARISON:  Chest radiograph dated 10/29/2022, CTA chest dated 10/29/2022 FINDINGS: Normal lung volumes. Persistent confluence right lower lung opacity. Patchy left basilar opacity. No definite pleural effusion questionable trace right apical pneumothorax. The heart size and mediastinal contours are within normal limits. The visualized skeletal structures are unremarkable. IMPRESSION: 1. Persistent confluence right lower lung opacity, suspicious for pneumonia. 2. Patchy left basilar opacity, likely atelectasis. 3. Questionable trace right apical pneumothorax. No definite pleural effusion. Recommend attention on follow-up. Electronically Signed   By: Darrin Nipper [name]   On: 10/30/2022 15:58   ECHOCARDIOGRAM COMPLETE  Result Date: 10/30/2022    ECHOCARDIOGRAM REPORT   Patient Name:   Leslie [name] Date of Exam: 10/30/2022 Medical Rec #:  [id number]           Height:       67.5 in Accession #:    [id number]          Weight:       171.5 lb Date of Birth:  [birth year]           BSA:          1.905 m Patient Age:    81 years            BP:           119/92 mmHg Patient Gender: F                   HR:           103 bpm. Exam Location:  Inpatient Procedure: 2D Echo, Color Doppler and Cardiac Doppler Indications:    I48.91* Unspecified atrial fibrillation  History:        Patient has prior history of Echocardiogram examinations, most                 recent 01/10/2016. Arrythmias:Atrial Fibrillation.  Sonographer:    Raquel Sarna  Senior RDCS Referring Phys: [phone] A CALDWELL Everson  1. Left ventricular ejection fraction, by estimation, is 40 to 45%. The left ventricle has mildly decreased function. The left ventricle demonstrates global hypokinesis. There is moderate asymmetric left ventricular hypertrophy of the basal-septal segment. Left ventricular diastolic parameters are indeterminate.  2. Right ventricular systolic function is normal. The  right ventricular size is normal. There is normal pulmonary artery systolic pressure.  3. Left atrial size was mildly dilated.  4. The mitral valve is abnormal. 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 No evidence of mitral stenosis.  5. The aortic valve was not well visualized. Aortic valve regurgitation is trivial. No aortic stenosis is present.  6. The inferior vena cava is dilated in size with >50% respiratory variability, suggesting right atrial pressure of 8 mmHg. Comparison(s): MR is worse from prior report. FINDINGS  Left Ventricle: Left ventricular ejection fraction, by estimation, is 40 to 45%. The left ventricle has mildly decreased function. The left ventricle demonstrates global hypokinesis. The left ventricular internal cavity size was normal in size. There is  moderate asymmetric left ventricular hypertrophy of the basal-septal segment. Left ventricular diastolic parameters are indeterminate. Right Ventricle: The right ventricular size is normal. No increase in right ventricular wall thickness. Right ventricular systolic function is normal. There is normal pulmonary artery systolic pressure. The tricuspid regurgitant velocity is 2.72 m/s, and  with an assumed right atrial pressure of 3 mmHg, the estimated right ventricular systolic pressure is 02.5 mmHg. Left Atrium: Left atrial size was mildly dilated. Right Atrium: Right atrial size was normal in size. Pericardium: The pericardium was not well visualized. Mitral Valve: MR volume 52. The mitral valve is abnormal.  Mild mitral annular calcification. 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 No evidence of mitral valve stenosis. Tricuspid Valve: The tricuspid valve is not well visualized. Tricuspid valve regurgitation is not demonstrated. No evidence of tricuspid stenosis. Aortic Valve: The aortic valve was not well visualized. Aortic valve regurgitation is trivial. No aortic stenosis is present. Pulmonic Valve: The pulmonic valve was not well visualized. Pulmonic valve regurgitation is not visualized. No evidence of pulmonic stenosis. Aorta: The aortic root, ascending aorta and aortic arch are all structurally normal, with no evidence of dilitation or obstruction. Venous: The inferior vena cava is dilated in size with greater than 50% respiratory variability, suggesting right atrial pressure of 8 mmHg. IAS/Shunts: No atrial level shunt detected by color flow Doppler.  LEFT VENTRICLE PLAX 2D LVIDd:         4.20 cm LVIDs:         3.50 cm LV PW:         0.90 cm LV IVS:        1.25 cm LVOT diam:     2.10 cm LV SV:         40 LV SV Index:   21 LVOT Area:     3.46 cm  RIGHT VENTRICLE RV S prime:     10.30 cm/s TAPSE (M-mode): 1.3 cm LEFT ATRIUM             Index        RIGHT ATRIUM           Index LA diam:        3.80 cm 1.99 cm/m   RA Area:     13.50 cm LA Vol (A2C):   65.3 ml 34.28 ml/m  RA Volume:   31.30 ml  16.43 ml/m LA Vol (A4C):   76.4 ml 40.11 ml/m LA Biplane Vol: 75.7 ml 39.74 ml/m  AORTIC VALVE LVOT Vmax:   74.00 cm/s LVOT Vmean:  48.700 cm/s LVOT VTI:    0.116 m  AORTA Ao Root diam: 3.30 cm Ao Asc diam:  3.10 cm MR Peak grad:    84.6 mmHg    TRICUSPID VALVE MR Mean grad:  60.0 mmHg    TR Peak grad:   29.6 mmHg MR Vmax:         460.00 cm/s  TR Vmax:        272.00 cm/s MR Vmean:        370.5 cm/s MR PISA:         4.02 cm     SHUNTS MR PISA Eff ROA: 28 mm       Systemic VTI:  0.12 m MR PISA Radius:  0.80 cm      Systemic Diam: 2.10 cm Rudean Haskell MD Electronically signed by Rudean Haskell MD  Signature Date/Time: 10/30/2022/10:54:26 AM    Final      Assessment & Plan    1.  Persistent atrial fibrillation, newly documented but of uncertain duration.  CHA2DS2-VASc score is 5.  Heart rate control still not optimal and she has concerns about possible side effects related to metoprolol.  She was placed on IV amiodarone in the interim presumably for heart rate control.  Currently on Eliquis and was to receive metoprolol 100 mg twice daily today.  2.  Moderate to severe mitral regurgitation.  Left atrium mildly dilated.  Leaflet motion overall preserved, suspect Carpentier class I although atrial functional mitral regurgitation would be less likely unless arrhythmia has been more chronic.  Plan is to reassess after getting her back in sinus rhythm.  3.  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  4.  Hypothyroidism on Synthroid, TSH 2.462.  5.  Community ommunity-acquired pneumonia with right pleural effusion status postthoracentesis and on antibiotics.  Chart reviewed.  Case discussed with patient and daughter.  Plan to stop metoprolol and transition to Cardizem 60 mg p.o. every 6 hours, if she tolerates this can switch to Cardizem CD 240 mg daily tomorrow presuming heart rate is better controlled.  Stopping IV amiodarone.  Continue Eliquis for stroke prophylaxis.  Ultimately, outpatient cardioversion can be considered in 3 weeks presuming interval heart rate control is obtained.  LVEF and degree of mitral regurgitation can then be reassessed in sinus rhythm.  Signed, Rozann Lesches, MD  11/01/2022, 8:07 AM

## 2022-11-02 ENCOUNTER — Other Ambulatory Visit (HOSPITAL_COMMUNITY): Payer: Self-pay

## 2022-11-02 ENCOUNTER — Encounter (HOSPITAL_COMMUNITY): Payer: Self-pay | Admitting: Critical Care Medicine

## 2022-11-02 DIAGNOSIS — J189 Pneumonia, unspecified organism: Secondary | ICD-10-CM | POA: Diagnosis not present

## 2022-11-02 DIAGNOSIS — I4891 Unspecified atrial fibrillation: Secondary | ICD-10-CM | POA: Diagnosis not present

## 2022-11-02 LAB — CBC
HCT: 37.3 % (ref 36.0–46.0)
Hemoglobin: 12.1 g/dL (ref 12.0–15.0)
MCH: 28.3 pg (ref 26.0–34.0)
MCHC: 32.4 g/dL (ref 30.0–36.0)
MCV: 87.4 fL (ref 80.0–100.0)
Platelets: 282 10*3/uL (ref 150–400)
RBC: 4.27 MIL/uL (ref 3.87–5.11)
RDW: 15.7 % — ABNORMAL HIGH (ref 11.5–15.5)
WBC: 13.9 10*3/uL — ABNORMAL HIGH (ref 4.0–10.5)
nRBC: 0 % (ref 0.0–0.2)

## 2022-11-02 LAB — BASIC METABOLIC PANEL
Anion gap: 12 (ref 5–15)
BUN: 27 mg/dL — ABNORMAL HIGH (ref 8–23)
CO2: 29 mmol/L (ref 22–32)
Calcium: 9 mg/dL (ref 8.9–10.3)
Chloride: 101 mmol/L (ref 98–111)
Creatinine, Ser: 1.25 mg/dL — ABNORMAL HIGH (ref 0.44–1.00)
GFR, Estimated: 44 mL/min — ABNORMAL LOW (ref >60)
Glucose, Bld: 109 mg/dL — ABNORMAL HIGH (ref 70–99)
Potassium: 3.2 mmol/L — ABNORMAL LOW (ref 3.5–5.1)
Sodium: 142 mmol/L (ref 135–145)

## 2022-11-02 LAB — BODY FLUID CULTURE W GRAM STAIN
Culture: NO GROWTH
Gram Stain: NONE SEEN

## 2022-11-02 LAB — LEGIONELLA PNEUMOPHILA SEROGP 1 UR AG: L. pneumophila Serogp 1 Ur Ag: NEGATIVE

## 2022-11-02 LAB — MAGNESIUM: Magnesium: 2.2 mg/dL (ref 1.7–2.4)

## 2022-11-02 LAB — PHOSPHORUS: Phosphorus: 3.9 mg/dL (ref 2.5–4.6)

## 2022-11-02 LAB — CYTOLOGY - NON PAP

## 2022-11-02 MED ORDER — POTASSIUM CHLORIDE CRYS ER 20 MEQ PO TBCR
40.0000 meq | EXTENDED_RELEASE_TABLET | ORAL | Status: AC
  Administered 2022-11-02 (×2): 40 meq via ORAL
  Filled 2022-11-02 (×2): qty 2

## 2022-11-02 MED ORDER — DILTIAZEM HCL 30 MG PO TABS: 30.0000 mg | ORAL_TABLET | Freq: Once | ORAL | Status: DC

## 2022-11-02 MED ORDER — DILTIAZEM HCL 60 MG PO TABS: 90.0000 mg | ORAL_TABLET | Freq: Four times a day (QID) | ORAL | Status: DC

## 2022-11-02 MED ORDER — DILTIAZEM HCL ER COATED BEADS 180 MG PO CP24
180.0000 mg | ORAL_CAPSULE | Freq: Every day | ORAL | Status: DC
  Administered 2022-11-02 – 2022-11-03 (×2): 180 mg via ORAL
  Filled 2022-11-02 (×2): qty 1

## 2022-11-02 MED ORDER — METOPROLOL TARTRATE 5 MG/5ML IV SOLN
5.0000 mg | INTRAVENOUS | Status: AC
  Administered 2022-11-02: 5 mg via INTRAVENOUS
  Filled 2022-11-02: qty 5

## 2022-11-02 NOTE — Progress Notes (Signed)
PT Cancellation Note  Patient Details Name: Leslie [name] MRN: [id number] DOB: Feb 23, 1941   Cancelled Treatment:    Reason Eval/Treat Not Completed: Medical issues which prohibited therapy remain this afternoon. Pt with HR ranging from 105-141bpm at rest, and RN reports that with transfer to [facility] earlier in the day, HR elevated to 160s and SBP dropped to 80s. PT will continue to follow and progress mobility as appropriate.   West Carbo, PT, DPT   Acute Rehabilitation Department   Sandra Cockayne 11/02/2022, 2:42 PM

## 2022-11-02 NOTE — Progress Notes (Signed)
PROGRESS NOTE    Leslie [name]  PYP:[id number] DOB: [birth year] DOA: 10/29/2022 PCP: Sonia Side., FNP  Chief Complaint  Patient presents with   new onset atrial fib    Brief Narrative:  This is Leslie [name] 81 year old female with past medical history of hypothyroidism and GERD.  In October, the patient had Leslie [name] series of unfortunate events.  Her bathroom blocked up, [employer] came and tore her floors up.  The smells irritates her respiratory passages and caused nausea and vomiting.  She went on to develop Leslie [name] hacking nonproductive cough.  She saw her PCP, he started on 2 antibiotics and p.o. steroids.  She states she did not get better.  Additionally, she developed palpitations that were worse at night.  She was short of breath, and had Leslie [name] mild wheeze.  She denies fever, chills.  She endorse some lightheadedness and dizziness and was very weak.  She had no appetite..  She returned to her PCP today where EKG was done revealing Leslie [name]-fib with RVR.  The patient was directed to the ER.  Patient has no prior history of atrial fibrillation.   Patient transferred from freestanding ER.  History provided by the patient  Assessment & Plan:   Principal Problem:   Atrial fibrillation with rapid ventricular response ([name]) Active Problems:   Community acquired pneumonia of left lower lobe of lung   Pleural effusion  Acute Pulmonary Edema  HFmrEF with Exacberbation  Mitral Valve Regurgitation Suspect related to RVR Clinically doesn't look that overloaded, but does have bibasilar crackles.  CXR 11/22 with increased bilateral interstitial opacities, new bilateral effusions.  Stable R basilar opacity. Had acute SOB 11/22 and orthopnea related to this, improved with lasix Continue lasix 40 mg daily IV Strict I/O, daily weights  Appreciate cardiology recommendations  Atrial fibrillation with RVR New onset in the setting of pneumonia Continued RVR today - uptitrate dilt as tolerated Chadsvasc 6 (age, sex,  HF, hx TIA) eliquis TSH wnl Echo with mildly decreased EF 40-45%, moderate asymmetric LVH, moderate to severe MVR Appreciate cardiology assistance -> they're stopping amiodarone and metoprolol, planning to transition to diltiazem PO.  Awaiting recs from 11/24. Consider outpatient cardioversion.  Community Acquired Pneumonia  Right Sided Pleural Effusion  Leukocytosis She was treated outpatient with courses of cefdinir and azithromycin clinically improving, WBC improved today - follow closely CT showing R lower lobe consolidation with Leslie [name] moderate right pleural effusion, tree in bud nodular densities in the right upper lobe CXR 11/22 with bibasilar atelectasis and probable right basilar infiltrate Currently on azithromycin and ceftriaxone, will continue for 7 day course Follow MRSA PCR (negative), sputum cx if able, urine strep (negative), urine legionella (pending) Blood cultures NGTD x2 S/p thoracentesis on 11/21 -> post procedure had questionable apical pneumothorax, resolved on 11/22 CXR Effusion transudative per lights criteria -> gram stain without WBC or organisms, follow culture (Ngx2).  Follow cytology (pending).  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 Left ventricular ejection fraction, by estimation, is  40 to 45%. The  left ventricle has mildly decreased function. The left ventricle   demonstrates global hypokinesis. There is moderate asymmetric left  ventricular hypertrophy of the basal-septal  segment. Left ventricular diastolic parameters are indeterminate.   2. Right ventricular systolic function is normal. The right ventricular  size is normal. There is normal pulmonary artery systolic pressure.   3. Left atrial size was mildly dilated.   4. The mitral valve is abnormal. Moderate to severe mitral valve  regurgitation. No evidence of mitral stenosis.   5. The aortic valve was not well visualized. Aortic valve regurgitation  is trivial. No aortic stenosis is present.   6. The inferior vena cava is dilated in size with >50% respiratory  variability, suggesting right atrial pressure of 8 mmHg.   Comparison(s): MR is worse from prior report.   Antimicrobials:  Anti-infectives (From admission, onward)    Start     Dose/Rate Route Frequency Ordered Stop   10/30/22 1000  cefTRIAXone (ROCEPHIN) 2 g in sodium chloride 0.9 % 100 mL IVPB        2 g 200 mL/hr over 30 Minutes Intravenous Every 24 hours 10/30/22 0222 11/04/22 0959   10/30/22 1000  azithromycin (ZITHROMAX) tablet 500 mg        500 mg Oral Daily 10/30/22 0222 11/04/22 0959   10/29/22 1715  cefTRIAXone (ROCEPHIN) 1 g in sodium chloride 0.9 % 100 mL IVPB        1 g 200 mL/hr over 30 Minutes Intravenous  Once 10/29/22 1705 10/29/22 1910   10/29/22 1715  azithromycin (ZITHROMAX) 500 mg in sodium chloride 0.9 % 250 mL IVPB        500 mg 250 mL/hr over 60 Minutes Intravenous  Once 10/29/22 1705 10/29/22 2022       Subjective: No complaints, feeling better  Objective: Vitals:   11/01/22 1600 11/01/22 1635 11/01/22 1958 11/01/22 2351  BP:  (!) 115/94 103/71 119/68  Pulse:   89 80  Resp: '17 15 17 17  '$ Temp:  (!) 97.4 F (36.3 C) 98 F (36.7 C) 98 F (36.7 C)  TempSrc:  Oral Oral Oral  SpO2:   94% 93%  Weight:    75.5 kg  Height:        Intake/Output Summary (Last 24 hours) at 11/02/2022 0903 Last  data filed at 11/01/2022 2354 Gross per 24 hour  Intake 1028.23 ml  Output 2750 ml  Net -1721.77 ml   Filed Weights   10/30/22 0016 11/01/22 0050 11/01/22 2351  Weight: 77.8 kg 75.6 kg 75.5 kg    Examination:  General: No acute distress. Cardiovascular: irregularly irregular, rates range from 100's-110's mostly into the 120's and 130's intermittently on tele during our conversation Lungs: fine bibasilar crackles Abdomen: Soft, nontender, nondistended Neurological: Alert and oriented 3. Moves all extremities 4. Cranial nerves II through XII grossly intact. Skin: Warm and dry. No rashes or lesions. Extremities: no LEE  Data Reviewed: I have personally reviewed following labs and imaging studies  CBC: Recent Labs  Lab 10/29/22 1552 10/30/22 0036 10/31/22 0108 11/01/22 0038 11/02/22 0030  WBC 13.3* 10.8* 14.4* 17.0* 13.9*  NEUTROABS 8.8*  --  11.4* 11.7*  --   HGB 10.5* 10.6* 9.9* 11.3* 12.1  HCT 33.9* 33.0* 32.9* 36.2 37.3  MCV 89.9 87.8 91.4 88.7 87.4  PLT 333 298 327 328 810    Basic Metabolic Panel: Recent Labs  Lab 10/29/22 1552 10/30/22 0354 10/31/22 0108 11/01/22 0038 11/02/22 0030  NA 141 140 139 137 142  K 4.0 5.1 4.3 3.7 3.2*  CL 110 111 109 101 101  CO2 20* 18* 17* 25 29  GLUCOSE 123* 179* 134* 151* 109*  BUN 24* 22 29* 32* 27*  CREATININE 1.03* 0.98 1.07* 1.26* 1.25*  CALCIUM 9.1 9.0 9.0 8.8* 9.0  MG 2.2  --  2.2 2.3 2.2  PHOS  --   --  3.3 3.5 3.9    GFR: Estimated Creatinine Clearance: 38.5 mL/min (Talib Headley) (by C-G formula based on SCr of 1.25 mg/dL (H)).  Liver Function Tests: Recent Labs  Lab 10/31/22 0108 11/01/22 0038  AST 47* 36  ALT 64* 63*  ALKPHOS 89 84  BILITOT 0.4 0.4  PROT 6.6 6.7  ALBUMIN 3.1* 3.1*    CBG: No results for input(s): "GLUCAP" in the last 168 hours.   Recent Results (from the past 240 hour(s))  Resp Panel by RT-PCR (Flu Tiawana Forgy&B, Covid) Anterior Nasal Swab     Status: None   Collection Time: 10/29/22  4:36 PM    Specimen: Anterior Nasal Swab  Result Value Ref Range Status   SARS Coronavirus 2 by RT PCR NEGATIVE NEGATIVE Final    Comment: (NOTE) SARS-CoV-2 target nucleic acids are NOT DETECTED.  The SARS-CoV-2 RNA is generally detectable in upper respiratory specimens during the acute phase of infection. The lowest concentration of SARS-CoV-2 viral copies this assay can detect is 138 copies/mL. Alexxis Mackert negative result does not preclude SARS-Cov-2 infection and should not be used as the sole basis for treatment or other patient management decisions. Rosemond Lyttle negative result may occur with  improper specimen collection/handling, submission of specimen other than nasopharyngeal swab, presence of viral mutation(s) within the areas targeted by this assay, and inadequate number of viral copies(<138 copies/mL). Alon Mazor negative result must be combined with clinical observations, patient history, and epidemiological information. The expected result is Negative.  Fact Sheet for Patients:  EntrepreneurPulse.com.au  Fact Sheet for Healthcare Providers:  IncredibleEmployment.be  This test is no t yet approved or cleared by the [place] FDA and  has been authorized for detection and/or diagnosis of SARS-CoV-2 by FDA under an Emergency Use Authorization (EUA). This EUA will remain  in effect (meaning this test can be used) for the duration of the COVID-19 declaration under Section 564(b)(1) of the Act, 21 U.S.C.section 360bbb-3(b)(1), unless the authorization is terminated  or revoked sooner.       Influenza Catarino Vold by PCR NEGATIVE NEGATIVE Final   Influenza B by PCR NEGATIVE NEGATIVE Final    Comment: (NOTE) The Xpert Xpress SARS-CoV-2/FLU/RSV plus assay is intended as an aid in the diagnosis of influenza from Nasopharyngeal swab specimens and should not be used as Azora Bonzo sole basis for treatment. Nasal washings and aspirates are unacceptable for Xpert Xpress  SARS-CoV-2/FLU/RSV testing.  Fact Sheet for Patients: EntrepreneurPulse.com.au  Fact Sheet for Healthcare Providers: IncredibleEmployment.be  This test is not yet approved or cleared by the [place] FDA and has been authorized for detection and/or diagnosis of SARS-CoV-2 by FDA under an Emergency Use Authorization (EUA). This EUA will remain in effect (meaning this test can be used) for the duration of the COVID-19 declaration under Section 564(b)(1) of the Act, 21 U.S.C. section 360bbb-3(b)(1), unless the authorization is terminated or revoked.  Performed at [facility], [address] [place], [place] [address]   Blood culture (routine x 2)     Status: None (Preliminary result)   Collection Time: 10/29/22  5:30 PM  Specimen: BLOOD  Result Value Ref Range Status   Specimen Description   Final    BLOOD RIGHT ANTECUBITAL Performed at [facility], [address] [place], [place] [address]    Special Requests   Final    Blood Culture adequate volume BOTTLES DRAWN AEROBIC AND ANAEROBIC Performed at [facility], [address] [place], [place] [address]    Culture   Final    NO GROWTH 4 DAYS Performed at [facility], [place] [address], [place], [place] [address]    Report Status PENDING  Incomplete  Blood culture (routine x 2)     Status: None (Preliminary result)   Collection Time: 10/29/22  6:10 PM   Specimen: BLOOD  Result Value Ref Range Status   Specimen Description   Final    BLOOD BLOOD RIGHT FOREARM Performed at [facility], [address] [place], [place] [address]    Special Requests   Final    Blood Culture adequate volume BOTTLES DRAWN AEROBIC ONLY Performed at [facility], [address] [place], [place] [address]    Culture   Final    NO GROWTH 4 DAYS Performed at [facility], [place] [address]  [place], [place] [address]    Report Status PENDING  Incomplete  MRSA Next Gen by PCR, Nasal     Status: None   Collection Time: 10/30/22  7:40 AM   Specimen: Nasal Mucosa; Nasal Swab  Result Value Ref Range Status   MRSA by PCR Next Gen NOT DETECTED NOT DETECTED Final    Comment: (NOTE) The GeneXpert MRSA Assay (FDA approved for NASAL specimens only), is one component of Evaline Waltman comprehensive MRSA colonization surveillance program. It is not intended to diagnose MRSA infection nor to guide or monitor treatment for MRSA infections. Test performance is not FDA approved in patients less than 24 years old. Performed at [facility], [place] [address] [place], [place] [address]   Body fluid culture w Gram Stain     Status: None (Preliminary result)   Collection Time: 10/30/22  3:51 PM   Specimen: Pleural Fluid  Result Value Ref Range Status   Specimen Description PLEURAL  Final   Special Requests RIGHT LUNG  Final   Gram Stain NO WBC SEEN NO ORGANISMS SEEN CYTOSPIN SMEAR   Final   Culture   Final    NO GROWTH 2 DAYS Performed at [facility], [place] [address], [place],  [address]    Report Status PENDING  Incomplete         Radiology Studies: DG CHEST PORT 1 VIEW  Result Date: 10/31/2022 CLINICAL DATA:  Shortness of breath EXAM: PORTABLE CHEST 1 VIEW COMPARISON:  Chest x-ray dated October 31, 2022 FINDINGS: Cardiac and mediastinal contours are unchanged. Increased bilateral interstitial opacities. Similar right basilar opacity. Probable new small bilateral pleural effusions. No evidence of pneumothorax IMPRESSION: 1. Slightly increased bilateral interstitial opacities, likely due to worsened pulmonary edema. 2. Similar right basilar opacity which may be due to infection or aspiration. 3. Probable new small bilateral pleural effusions. Electronically Signed   By: Yetta Glassman [name]   On: 10/31/2022 15:57        Scheduled Meds:  apixaban  5 mg Oral BID    azithromycin  500 mg Oral Daily   vitamin B-12  1,000 mcg Oral Daily   diltiazem  30 mg Oral Once   diltiazem  90 mg Oral Q6H  furosemide  40 mg Intravenous Daily   levothyroxine  88 mcg Oral Q0600   potassium chloride  40 mEq Oral Q4H   Continuous Infusions:  cefTRIAXone (ROCEPHIN)  IV 2 g (11/02/22 0830)     LOS: 3 days    Time spent: over 30 min    Fayrene Helper, MD [facility]   To contact the attending provider between 7A-7P or the covering provider during after hours 7P-7A, please log into the web site [email or web address] and access using universal [place] password for that web site. If you do not have the password, please call the hospital operator.  11/02/2022, 9:03 AM

## 2022-11-02 NOTE — TOC Benefit Eligibility Note (Signed)
Patient Research scientist (life sciences) completed.     The patient is currently admitted and upon discharge could be taking FARXIGA.   The current 30 day co-pay is, $95.   The patient is currently admitted and upon discharge could be taking JARDIANCE.   The current 30 day co-pay is, $45.  The patient is insured through [employer].

## 2022-11-02 NOTE — Progress Notes (Addendum)
Rounding Note    Patient Name: Leslie [name] Date of Encounter: 11/02/2022  [place] Cardiologist: Peter Martinique, MD   Subjective   Patient sitting up in bed this morning, in no acute distress. She reports ongoing intermittent sensation of palpitations (though decreased frequency/severity) and is without chest pain or focal dyspnea. Denies any lightheadedness/dizziness.  Inpatient Medications    Scheduled Meds:  apixaban  5 mg Oral BID   azithromycin  500 mg Oral Daily   vitamin B-12  1,000 mcg Oral Daily   diltiazem  180 mg Oral Daily   furosemide  40 mg Intravenous Daily   levothyroxine  88 mcg Oral Q0600   potassium chloride  40 mEq Oral Q4H   Continuous Infusions:  cefTRIAXone (ROCEPHIN)  IV 2 g (11/02/22 0830)   PRN Meds: famotidine, guaiFENesin, ipratropium-albuterol, melatonin, ondansetron   Vital Signs    Vitals:   11/01/22 1600 11/01/22 1635 11/01/22 1958 11/01/22 2351  BP:  (!) 115/94 103/71 119/68  Pulse:   89 80  Resp: '17 15 17 17  '$ Temp:  (!) 97.4 F (36.3 C) 98 F (36.7 C) 98 F (36.7 C)  TempSrc:  Oral Oral Oral  SpO2:   94% 93%  Weight:    75.5 kg  Height:        Intake/Output Summary (Last 24 hours) at 11/02/2022 1012 Last data filed at 11/01/2022 2354 Gross per 24 hour  Intake 1028.23 ml  Output 2750 ml  Net -1721.77 ml      11/01/2022   11:51 PM 11/01/2022   12:50 AM 10/30/2022   12:16 AM  Last 3 Weights  Weight (lbs) 166 lb 7.2 oz 166 lb 10.7 oz 171 lb 8.3 oz  Weight (kg) 75.5 kg 75.6 kg 77.8 kg      Telemetry    Atrial fibrillation with rates typically 80s-low 100s - Personally Reviewed  ECG    No new tracing  Physical Exam   GEN: No acute distress.   Neck: No JVD Cardiac: Irregularly irregular, no murmurs, rubs, or gallops.  Respiratory: Right lower lobe diminished. Quiet bibasilar crackes. No wheezing or rhonchi. GI: Soft, nontender, non-distended  MS: No edema; No deformity. Neuro:  Nonfocal   Psych: Normal affect   Labs    High Sensitivity Troponin:   Recent Labs  Lab 10/29/22 1552 10/29/22 2018 10/31/22 1540 10/31/22 1736  TROPONINIHS '13 11 12 14     '$ Chemistry Recent Labs  Lab 10/31/22 0108 11/01/22 0038 11/02/22 0030  NA 139 137 142  K 4.3 3.7 3.2*  CL 109 101 101  CO2 17* 25 29  GLUCOSE 134* 151* 109*  BUN 29* 32* 27*  CREATININE 1.07* 1.26* 1.25*  CALCIUM 9.0 8.8* 9.0  MG 2.2 2.3 2.2  PROT 6.6 6.7  --   ALBUMIN 3.1* 3.1*  --   AST 47* 36  --   ALT 64* 63*  --   ALKPHOS 89 84  --   BILITOT 0.4 0.4  --   GFRNONAA 53* 43* 44*  ANIONGAP '13 11 12    '$ Lipids  Recent Labs  Lab 10/31/22 0108  CHOL 151  TRIG 61  HDL 53  LDLCALC 86  CHOLHDL 2.8    Hematology Recent Labs  Lab 10/31/22 0108 11/01/22 0038 11/02/22 0030  WBC 14.4* 17.0* 13.9*  RBC 3.60* 4.08 4.27  HGB 9.9* 11.3* 12.1  HCT 32.9* 36.2 37.3  MCV 91.4 88.7 87.4  MCH 27.5 27.7 28.3  MCHC 30.1  31.2 32.4  RDW 15.6* 15.6* 15.7*  PLT 327 328 282   Thyroid  Recent Labs  Lab 10/29/22 1552  TSH 2.462  FREET4 1.02    BNP Recent Labs  Lab 10/31/22 1540 11/01/22 0038  BNP 733.7* 791.8*    DDimer  Recent Labs  Lab 10/29/22 2057  DDIMER 2.37*     Radiology    DG CHEST PORT 1 VIEW  Result Date: 10/31/2022 CLINICAL DATA:  Shortness of breath EXAM: PORTABLE CHEST 1 VIEW COMPARISON:  Chest x-ray dated October 31, 2022 FINDINGS: Cardiac and mediastinal contours are unchanged. Increased bilateral interstitial opacities. Similar right basilar opacity. Probable new small bilateral pleural effusions. No evidence of pneumothorax IMPRESSION: 1. Slightly increased bilateral interstitial opacities, likely due to worsened pulmonary edema. 2. Similar right basilar opacity which may be due to infection or aspiration. 3. Probable new small bilateral pleural effusions. Electronically Signed   By: Yetta Glassman [name]   On: 10/31/2022 15:57    Cardiac Studies   10/30/22 TTE  IMPRESSIONS      1. Left ventricular ejection fraction, by estimation, is 40 to 45%. The  left ventricle has mildly decreased function. The left ventricle  demonstrates global hypokinesis. There is moderate asymmetric left  ventricular hypertrophy of the basal-septal  segment. Left ventricular diastolic parameters are indeterminate.   2. Right ventricular systolic function is normal. The right ventricular  size is normal. There is normal pulmonary artery systolic pressure.   3. Left atrial size was mildly dilated.   4. The mitral valve is abnormal. Moderate to severe mitral valve  regurgitation. No evidence of mitral stenosis.   5. The aortic valve was not well visualized. Aortic valve regurgitation  is trivial. No aortic stenosis is present.   6. The inferior vena cava is dilated in size with >50% respiratory  variability, suggesting right atrial pressure of 8 mmHg.   Comparison(s): MR is worse from prior report.   FINDINGS   Left Ventricle: Left ventricular ejection fraction, by estimation, is 40  to 45%. The left ventricle has mildly decreased function. The left  ventricle demonstrates global hypokinesis. The left ventricular internal  cavity size was normal in size. There is   moderate asymmetric left ventricular hypertrophy of the basal-septal  segment. Left ventricular diastolic parameters are indeterminate.   Right Ventricle: The right ventricular size is normal. No increase in  right ventricular wall thickness. Right ventricular systolic function is  normal. There is normal pulmonary artery systolic pressure. The tricuspid  regurgitant velocity is 2.72 m/s, and   with an assumed right atrial pressure of 3 mmHg, the estimated right  ventricular systolic pressure is 96.7 mmHg.   Left Atrium: Left atrial size was mildly dilated.   Right Atrium: Right atrial size was normal in size.   Pericardium: The pericardium was not well visualized.   Mitral Valve: MR volume 52. The mitral valve  is abnormal. Mild mitral  annular calcification. 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 No  evidence of mitral valve stenosis.   Tricuspid Valve: The tricuspid valve is not well visualized. Tricuspid  valve regurgitation is not demonstrated. No evidence of tricuspid  stenosis.   Aortic Valve: The aortic valve was not well visualized. Aortic valve  regurgitation is trivial. No aortic stenosis is present.   Pulmonic Valve: The pulmonic valve was not well visualized. Pulmonic valve  regurgitation is not visualized. No evidence of pulmonic stenosis.   Aorta: The aortic root, ascending aorta and aortic arch are all  structurally normal, with no evidence of dilitation or obstruction.   Venous: The inferior vena cava is dilated in size with greater than 50%  respiratory variability, suggesting right atrial pressure of 8 mmHg.   IAS/Shunts: No atrial level shunt detected by color flow Doppler.   Patient Profile     81 y.o. female with a hx of hypothyroidism, GERD, TIA who is being seen 10/30/2022 for the evaluation of new onset afib at the request of Dr. Florene Glen.   Assessment & Plan    Atrial fibrillation with rapid ventricular response Secondary hypercoagulable state  Patient with apparently new onset afib this admission. No reports of palpitations, irregular HR prior to admission. Patient initially managed with IV diltiazem, transitioned to Metoprolol. Apparently patient had concerns about Metoprolol side effects and she was temporarily placed on Amiodarone. Shared decision made between patient, her daughter, and Dr. Domenic Polite yesterday to transition to oral Diltiazem.   Rates generally stable on Diltiazem. Will convert to long acting today, '180mg'$  given lower BP. Would prefer '240mg'$  daily if tolerated at discharge Continue Eliquis BID Plan for outpatient cardioversion following 3 weeks of uninterrupted [place]   Acute HFmrEF  Patient with LVEF decreased to 40-45% on TTE this  admission. Possibly 2/2 afib with RVR. Appears euvolemic on exam though has very mild bibasilar crackles. BNP up to 791.8 on 11/23, though not a reliable indicator of volume status in the setting of afib with RVR.   Patient doesn't appear to require further diuresis at this time. Potentially will need PRN PO dosing at discharge.  Consider addition of SGLT2 Plan to reassess TTE after outpatient cardioversion.   Moderate to Severe Mitral Regurgitation   Patient with moderate to severe mitral regurg noted on TTE this admission. Unclear if this is rhythm related, plan to reassess upon restoration of NSR.    Hypothyroidism  Continue home Synthroid  Lab Results  Component Value Date   TSH 2.462 10/29/2022    Management per primary team:  Community Acquired pneumonia s/p thoracentesis. Continues with abx.  Emphysema Anemia GERD      For questions or updates, please contact [place] Please consult [email or web address] for contact info under        Signed, Lily Kocher, PA-C  11/02/2022, 10:12 AM

## 2022-11-03 ENCOUNTER — Inpatient Hospital Stay (HOSPITAL_COMMUNITY): Payer: Medicare HMO | Admitting: Certified Registered Nurse Anesthetist

## 2022-11-03 ENCOUNTER — Inpatient Hospital Stay (HOSPITAL_COMMUNITY): Payer: Medicare HMO

## 2022-11-03 ENCOUNTER — Encounter (HOSPITAL_COMMUNITY)
Admission: EM | Disposition: A | Discharge status: Rehabilitation Facility | Payer: Self-pay | Source: Home / Self Care | Attending: Family Medicine

## 2022-11-03 DIAGNOSIS — I63511 Cerebral infarction due to unspecified occlusion or stenosis of right middle cerebral artery: Secondary | ICD-10-CM | POA: Diagnosis not present

## 2022-11-03 DIAGNOSIS — E039 Hypothyroidism, unspecified: Secondary | ICD-10-CM | POA: Diagnosis not present

## 2022-11-03 DIAGNOSIS — Z87891 Personal history of nicotine dependence: Secondary | ICD-10-CM

## 2022-11-03 DIAGNOSIS — I4891 Unspecified atrial fibrillation: Secondary | ICD-10-CM

## 2022-11-03 DIAGNOSIS — I63411 Cerebral infarction due to embolism of right middle cerebral artery: Secondary | ICD-10-CM | POA: Diagnosis not present

## 2022-11-03 DIAGNOSIS — I6601 Occlusion and stenosis of right middle cerebral artery: Secondary | ICD-10-CM | POA: Diagnosis present

## 2022-11-03 HISTORY — PX: IR PERCUTANEOUS ART THROMBECTOMY/INFUSION INTRACRANIAL INC DIAG ANGIO: IMG6087

## 2022-11-03 HISTORY — PX: IR ANGIO EXTRACRAN SEL COM CAROTID INNOMINATE UNI L MOD SED: IMG5355

## 2022-11-03 HISTORY — PX: IR CT HEAD LTD: IMG2386

## 2022-11-03 HISTORY — PX: RADIOLOGY WITH ANESTHESIA: SHX6223

## 2022-11-03 LAB — CULTURE, BLOOD (ROUTINE X 2)
Culture: NO GROWTH
Culture: NO GROWTH
Special Requests: ADEQUATE
Special Requests: ADEQUATE

## 2022-11-03 LAB — BASIC METABOLIC PANEL
Anion gap: 10 (ref 5–15)
BUN: 23 mg/dL (ref 8–23)
CO2: 29 mmol/L (ref 22–32)
Calcium: 9.3 mg/dL (ref 8.9–10.3)
Chloride: 101 mmol/L (ref 98–111)
Creatinine, Ser: 1.25 mg/dL — ABNORMAL HIGH (ref 0.44–1.00)
GFR, Estimated: 44 mL/min — ABNORMAL LOW (ref >60)
Glucose, Bld: 140 mg/dL — ABNORMAL HIGH (ref 70–99)
Potassium: 4.3 mmol/L (ref 3.5–5.1)
Sodium: 140 mmol/L (ref 135–145)

## 2022-11-03 LAB — CBC
HCT: 39.3 % (ref 36.0–46.0)
Hemoglobin: 12.5 g/dL (ref 12.0–15.0)
MCH: 28.1 pg (ref 26.0–34.0)
MCHC: 31.8 g/dL (ref 30.0–36.0)
MCV: 88.3 fL (ref 80.0–100.0)
Platelets: 348 10*3/uL (ref 150–400)
RBC: 4.45 MIL/uL (ref 3.87–5.11)
RDW: 15.8 % — ABNORMAL HIGH (ref 11.5–15.5)
WBC: 14.3 10*3/uL — ABNORMAL HIGH (ref 4.0–10.5)
nRBC: 0 % (ref 0.0–0.2)

## 2022-11-03 LAB — GLUCOSE, CAPILLARY: Glucose-Capillary: 149 mg/dL — ABNORMAL HIGH (ref 70–99)

## 2022-11-03 LAB — MAGNESIUM: Magnesium: 2.5 mg/dL — ABNORMAL HIGH (ref 1.7–2.4)

## 2022-11-03 LAB — PHOSPHORUS: Phosphorus: 4 mg/dL (ref 2.5–4.6)

## 2022-11-03 SURGERY — IR WITH ANESTHESIA
Anesthesia: General

## 2022-11-03 MED ORDER — AMIODARONE IV BOLUS ONLY 150 MG/100ML
150.0000 mg | Freq: Once | INTRAVENOUS | Status: AC
  Administered 2022-11-03: 150 mg via INTRAVENOUS
  Filled 2022-11-03: qty 100

## 2022-11-03 MED ORDER — CEFAZOLIN SODIUM-DEXTROSE 2-3 GM-%(50ML) IV SOLR
INTRAVENOUS | Status: DC | PRN
  Administered 2022-11-03: 2 g via INTRAVENOUS

## 2022-11-03 MED ORDER — ONDANSETRON HCL 4 MG/2ML IJ SOLN
INTRAMUSCULAR | Status: DC | PRN
  Administered 2022-11-03: 4 mg via INTRAVENOUS

## 2022-11-03 MED ORDER — AMIODARONE HCL IN DEXTROSE 360-4.14 MG/200ML-% IV SOLN
60.0000 mg/h | INTRAVENOUS | Status: DC
  Administered 2022-11-03 (×2): 60 mg/h via INTRAVENOUS
  Filled 2022-11-03 (×2): qty 200

## 2022-11-03 MED ORDER — FAMOTIDINE IN NACL 20-0.9 MG/50ML-% IV SOLN
20.0000 mg | INTRAVENOUS | Status: DC
  Administered 2022-11-03: 20 mg via INTRAVENOUS
  Filled 2022-11-03 (×2): qty 50

## 2022-11-03 MED ORDER — STROKE: EARLY STAGES OF RECOVERY BOOK
Freq: Once | Status: AC
  Filled 2022-11-03: qty 1

## 2022-11-03 MED ORDER — LACTATED RINGERS IV SOLN: INTRAVENOUS | Status: DC | PRN

## 2022-11-03 MED ORDER — SODIUM CHLORIDE 0.9 % IV SOLN: INTRAVENOUS | Status: DC | PRN

## 2022-11-03 MED ORDER — DILTIAZEM HCL ER COATED BEADS 180 MG PO CP24
360.0000 mg | ORAL_CAPSULE | Freq: Every day | ORAL | Status: DC
  Administered 2022-11-04 – 2022-11-08 (×5): 360 mg via ORAL
  Filled 2022-11-03 (×3): qty 1
  Filled 2022-11-03: qty 2
  Filled 2022-11-03: qty 1
  Filled 2022-11-03: qty 2

## 2022-11-03 MED ORDER — SODIUM CHLORIDE 0.9 % IV SOLN: INTRAVENOUS | Status: DC

## 2022-11-03 MED ORDER — SUCCINYLCHOLINE CHLORIDE 200 MG/10ML IV SOSY
PREFILLED_SYRINGE | INTRAVENOUS | Status: DC | PRN
  Administered 2022-11-03: 120 mg via INTRAVENOUS

## 2022-11-03 MED ORDER — DEXAMETHASONE SODIUM PHOSPHATE 10 MG/ML IJ SOLN
INTRAMUSCULAR | Status: DC | PRN
  Administered 2022-11-03: 5 mg via INTRAVENOUS

## 2022-11-03 MED ORDER — FENTANYL CITRATE (PF) 100 MCG/2ML IJ SOLN
INTRAMUSCULAR | Status: AC
  Filled 2022-11-03: qty 2

## 2022-11-03 MED ORDER — PHENYLEPHRINE HCL-NACL 20-0.9 MG/250ML-% IV SOLN
INTRAVENOUS | Status: DC | PRN
  Administered 2022-11-03: 40 ug/min via INTRAVENOUS

## 2022-11-03 MED ORDER — ROCURONIUM BROMIDE 10 MG/ML (PF) SYRINGE
PREFILLED_SYRINGE | INTRAVENOUS | Status: DC | PRN
  Administered 2022-11-03: 50 mg via INTRAVENOUS

## 2022-11-03 MED ORDER — METOPROLOL TARTRATE 5 MG/5ML IV SOLN
5.0000 mg | INTRAVENOUS | Status: AC | PRN
  Administered 2022-11-03: 2.5 mg via INTRAVENOUS
  Administered 2022-11-04 – 2022-11-06 (×2): 5 mg via INTRAVENOUS
  Filled 2022-11-03 (×3): qty 5

## 2022-11-03 MED ORDER — AMIODARONE LOAD VIA INFUSION
150.0000 mg | Freq: Once | INTRAVENOUS | Status: AC
  Administered 2022-11-03: 150 mg via INTRAVENOUS
  Filled 2022-11-03 (×3): qty 83.34

## 2022-11-03 MED ORDER — CEFAZOLIN SODIUM-DEXTROSE 2-4 GM/100ML-% IV SOLN
INTRAVENOUS | Status: AC
  Filled 2022-11-03: qty 100

## 2022-11-03 MED ORDER — DILTIAZEM HCL ER COATED BEADS 180 MG PO CP24
180.0000 mg | ORAL_CAPSULE | Freq: Once | ORAL | Status: AC
  Administered 2022-11-03: 180 mg via ORAL
  Filled 2022-11-03: qty 1

## 2022-11-03 MED ORDER — FENTANYL CITRATE (PF) 100 MCG/2ML IJ SOLN
INTRAMUSCULAR | Status: DC | PRN
  Administered 2022-11-03: 100 ug via INTRAVENOUS

## 2022-11-03 MED ORDER — PROPOFOL 10 MG/ML IV BOLUS
INTRAVENOUS | Status: DC | PRN
  Administered 2022-11-03: 100 mg via INTRAVENOUS

## 2022-11-03 MED ORDER — PHENYLEPHRINE 80 MCG/ML (10ML) SYRINGE FOR IV PUSH (FOR BLOOD PRESSURE SUPPORT)
PREFILLED_SYRINGE | INTRAVENOUS | Status: DC | PRN
  Administered 2022-11-03: 160 ug via INTRAVENOUS

## 2022-11-03 MED ORDER — AMIODARONE HCL IN DEXTROSE 360-4.14 MG/200ML-% IV SOLN
60.0000 mg/h | INTRAVENOUS | Status: DC
  Administered 2022-11-03: 30 mg/h via INTRAVENOUS
  Administered 2022-11-04: 60 mg/h via INTRAVENOUS
  Filled 2022-11-03 (×3): qty 200

## 2022-11-03 MED ORDER — NITROGLYCERIN 1 MG/10 ML FOR IR/CATH LAB
INTRA_ARTERIAL | Status: AC
  Filled 2022-11-03: qty 10

## 2022-11-03 MED ORDER — IOHEXOL 350 MG/ML SOLN
150.0000 mL | Freq: Once | INTRAVENOUS | Status: AC | PRN
  Administered 2022-11-03: 100 mL via INTRA_ARTERIAL

## 2022-11-03 MED ORDER — CHLORHEXIDINE GLUCONATE CLOTH 2 % EX PADS
6.0000 | MEDICATED_PAD | Freq: Every day | CUTANEOUS | Status: DC
  Administered 2022-11-03 – 2022-11-06 (×4): 6 via TOPICAL

## 2022-11-03 MED ORDER — SUGAMMADEX SODIUM 200 MG/2ML IV SOLN
INTRAVENOUS | Status: DC | PRN
  Administered 2022-11-03: 200 mg via INTRAVENOUS

## 2022-11-03 MED ORDER — ORAL CARE MOUTH RINSE: 15.0000 mL | OROMUCOSAL | Status: DC | PRN

## 2022-11-03 MED ORDER — LIDOCAINE 2% (20 MG/ML) 5 ML SYRINGE
INTRAMUSCULAR | Status: DC | PRN
  Administered 2022-11-03: 50 mg via INTRAVENOUS

## 2022-11-03 MED ORDER — ESMOLOL HCL 100 MG/10ML IV SOLN
INTRAVENOUS | Status: DC | PRN
  Administered 2022-11-03 (×2): 20 mg via INTRAVENOUS

## 2022-11-03 MED ORDER — CLEVIDIPINE BUTYRATE 0.5 MG/ML IV EMUL
0.0000 mg/h | INTRAVENOUS | Status: AC
  Administered 2022-11-03: 1 mg/h via INTRAVENOUS

## 2022-11-03 MED ORDER — IOHEXOL 350 MG/ML SOLN
60.0000 mL | Freq: Once | INTRAVENOUS | Status: AC | PRN
  Administered 2022-11-03: 60 mL via INTRAVENOUS

## 2022-11-03 MED ORDER — FUROSEMIDE 40 MG PO TABS
40.0000 mg | ORAL_TABLET | Freq: Every day | ORAL | Status: DC
  Administered 2022-11-04 – 2022-11-08 (×5): 40 mg via ORAL
  Filled 2022-11-03 (×6): qty 1

## 2022-11-03 NOTE — Transfer of Care (Signed)
Immediate Anesthesia Transfer of Care Note  Patient: Leslie [name]  Procedure(s) Performed: IR WITH ANESTHESIA  Patient Location: ICU  Anesthesia Type:General  Level of Consciousness: awake and alert   Airway & Oxygen Therapy: Patient Spontanous Breathing and Patient connected to face mask oxygen  Post-op Assessment: Report given to RN and Post -op Vital signs reviewed and stable  Post vital signs: Reviewed and stable  Last Vitals:  Vitals Value Taken Time  BP 135/72 11/03/22 1657  Temp    Pulse 135 11/03/22 1659  Resp 22 11/03/22 1659  SpO2 94 % 11/03/22 1659  Vitals shown include unvalidated device data.  Last Pain:  Vitals:   11/03/22 1401  TempSrc: Oral  PainSc:       Patients Stated Pain Goal: 4 (45/99/77 4142)  Complications: No notable events documented.

## 2022-11-03 NOTE — Progress Notes (Signed)
Rounding Note    Patient Name: Leslie [name] Date of Encounter: 11/03/2022  [place] Cardiologist: Peter Martinique, MD   Subjective   Anxious this morning but otherwise well.  Remains in atrial fibrillation with rapid rates.  Does feel palpitations.  Inpatient Medications    Scheduled Meds:  apixaban  5 mg Oral BID   vitamin B-12  1,000 mcg Oral Daily   diltiazem  180 mg Oral Once   [START ON 11/04/2022] diltiazem  360 mg Oral Daily   furosemide  40 mg Intravenous Daily   levothyroxine  88 mcg Oral Q0600   Continuous Infusions:  cefTRIAXone (ROCEPHIN)  IV 2 g (11/03/22 0822)   PRN Meds: famotidine, guaiFENesin, ipratropium-albuterol, melatonin, metoprolol tartrate, ondansetron   Vital Signs    Vitals:   11/02/22 2343 11/02/22 2344 11/03/22 0504 11/03/22 0727  BP:  (!) 116/58 98/70 116/71  Pulse:  (!) 104 70 78  Resp:  '20 20 20  '$ Temp:  (!) 97.4 F (36.3 C) (!) 97.4 F (36.3 C) (!) 97.3 F (36.3 C)  TempSrc:  Oral Oral Oral  SpO2: 100% 100% 98% 100%  Weight:   74.7 kg   Height:        Intake/Output Summary (Last 24 hours) at 11/03/2022 0900 Last data filed at 11/02/2022 2100 Gross per 24 hour  Intake 800 ml  Output 1000 ml  Net -200 ml       11/03/2022    5:04 AM 11/01/2022   11:51 PM 11/01/2022   12:50 AM  Last 3 Weights  Weight (lbs) 164 lb 10.9 oz 166 lb 7.2 oz 166 lb 10.7 oz  Weight (kg) 74.7 kg 75.5 kg 75.6 kg      Telemetry    Atrial fibrillation with rapid rates-personally reviewed  ECG    No new tracing  Physical Exam   GEN: Well nourished, well developed, in no acute distress  HEENT: normal  Neck: no JVD, carotid bruits, or masses Cardiac: tachycardic, irregular; no murmurs, rubs, or gallops,no edema  Respiratory:  clear to auscultation bilaterally, normal work of breathing GI: soft, nontender, nondistended, + BS MS: no deformity or atrophy  Skin: warm and dry Neuro:  Strength and sensation are intact Psych:  euthymic mood, full affect   Labs    High Sensitivity Troponin:   Recent Labs  Lab 10/29/22 1552 10/29/22 2018 10/31/22 1540 10/31/22 1736  TROPONINIHS '13 11 12 14      '$ Chemistry Recent Labs  Lab 10/31/22 0108 11/01/22 0038 11/02/22 0030 11/03/22 0047  NA 139 137 142 140  K 4.3 3.7 3.2* 4.3  CL 109 101 101 101  CO2 17* '25 29 29  '$ GLUCOSE 134* 151* 109* 140*  BUN 29* 32* 27* 23  CREATININE 1.07* 1.26* 1.25* 1.25*  CALCIUM 9.0 8.8* 9.0 9.3  MG 2.2 2.3 2.2 2.5*  PROT 6.6 6.7  --   --   ALBUMIN 3.1* 3.1*  --   --   AST 47* 36  --   --   ALT 64* 63*  --   --   ALKPHOS 89 84  --   --   BILITOT 0.4 0.4  --   --   GFRNONAA 53* 43* 44* 44*  ANIONGAP '13 11 12 10     '$ Lipids  Recent Labs  Lab 10/31/22 0108  CHOL 151  TRIG 61  HDL 53  LDLCALC 86  CHOLHDL 2.8     Hematology Recent Labs  Lab 11/01/22  0038 11/02/22 0030 11/03/22 0047  WBC 17.0* 13.9* 14.3*  RBC 4.08 4.27 4.45  HGB 11.3* 12.1 12.5  HCT 36.2 37.3 39.3  MCV 88.7 87.4 88.3  MCH 27.7 28.3 28.1  MCHC 31.2 32.4 31.8  RDW 15.6* 15.7* 15.8*  PLT 328 282 348    Thyroid  Recent Labs  Lab 10/29/22 1552  TSH 2.462  FREET4 1.02     BNP Recent Labs  Lab 10/31/22 1540 11/01/22 0038  BNP 733.7* 791.8*     DDimer  Recent Labs  Lab 10/29/22 2057  DDIMER 2.37*      Radiology    No results found.  Cardiac Studies   10/30/22 TTE  IMPRESSIONS     1. Left ventricular ejection fraction, by estimation, is 40 to 45%. The  left ventricle has mildly decreased function. The left ventricle  demonstrates global hypokinesis. There is moderate asymmetric left  ventricular hypertrophy of the basal-septal  segment. Left ventricular diastolic parameters are indeterminate.   2. Right ventricular systolic function is normal. The right ventricular  size is normal. There is normal pulmonary artery systolic pressure.   3. Left atrial size was mildly dilated.   4. The mitral valve is abnormal.  Moderate to severe mitral valve  regurgitation. No evidence of mitral stenosis.   5. The aortic valve was not well visualized. Aortic valve regurgitation  is trivial. No aortic stenosis is present.   6. The inferior vena cava is dilated in size with >50% respiratory  variability, suggesting right atrial pressure of 8 mmHg.   Comparison(s): MR is worse from prior report.   FINDINGS   Left Ventricle: Left ventricular ejection fraction, by estimation, is 40  to 45%. The left ventricle has mildly decreased function. The left  ventricle demonstrates global hypokinesis. The left ventricular internal  cavity size was normal in size. There is   moderate asymmetric left ventricular hypertrophy of the basal-septal  segment. Left ventricular diastolic parameters are indeterminate.   Right Ventricle: The right ventricular size is normal. No increase in  right ventricular wall thickness. Right ventricular systolic function is  normal. There is normal pulmonary artery systolic pressure. The tricuspid  regurgitant velocity is 2.72 m/s, and   with an assumed right atrial pressure of 3 mmHg, the estimated right  ventricular systolic pressure is 73.5 mmHg.   Left Atrium: Left atrial size was mildly dilated.   Right Atrium: Right atrial size was normal in size.   Pericardium: The pericardium was not well visualized.   Mitral Valve: MR volume 52. The mitral valve is abnormal. Mild mitral  annular calcification. 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 No  evidence of mitral valve stenosis.   Tricuspid Valve: The tricuspid valve is not well visualized. Tricuspid  valve regurgitation is not demonstrated. No evidence of tricuspid  stenosis.   Aortic Valve: The aortic valve was not well visualized. Aortic valve  regurgitation is trivial. No aortic stenosis is present.   Pulmonic Valve: The pulmonic valve was not well visualized. Pulmonic valve  regurgitation is not visualized. No evidence of  pulmonic stenosis.   Aorta: The aortic root, ascending aorta and aortic arch are all  structurally normal, with no evidence of dilitation or obstruction.   Venous: The inferior vena cava is dilated in size with greater than 50%  respiratory variability, suggesting right atrial pressure of 8 mmHg.   IAS/Shunts: No atrial level shunt detected by color flow Doppler.   Patient Profile     81 y.o.  female with a hx of hypothyroidism, GERD, TIA who is being seen 10/30/2022 for the evaluation of new onset afib at the request of Dr. Florene Glen.   Assessment & Plan    1.  Atrial fibrillation with rapid response: Currently on diltiazem 180 mg daily.  Her rates have gone up overnight last night.  Ronee Ranganathan increase diltiazem to 360 mg daily.  If she becomes better rate controlled, would be okay to discharge with plans for outpatient cardioversion.  Continue Eliquis.  2.  Acute heart failure with mildly reduced ejection fraction: Ejection fraction 40 to 45% this admission.  Potentially due to rapid atrial fibrillation.  Nathon Stefanski need a echo post cardioversion.  No obvious volume overload.  3.  Moderate to severe mitral regurgitation: Potentially related to rapid atrial fibrillation.  We Domonic Kimball need to reassess once sinus rhythm has been restored.      For questions or updates, please contact [place] Please consult [email or web address] for contact info under        Signed, Alaiah Lundy Meredith Leeds, MD  11/03/2022, 9:00 AM

## 2022-11-03 NOTE — Progress Notes (Signed)
Nurse called to room at 1355 by the Aide. Patient was found to be slurring words with noticeable left side facial droop. Patient was also exhibiting left side neglect with no ability to move left extremity. Charge nurse notified and code stroke called immediately. Patient was taken to CT scan for futher evaluation.

## 2022-11-03 NOTE — Progress Notes (Signed)
Pt having rapid afib with rate between 120-150s. Pt also c/o diaphoresis and nausea. On call cardiology called and order given for '5mg'$  metoprolol. Rate down to 100s-116. Pt states that she feels better.

## 2022-11-03 NOTE — Progress Notes (Signed)
OT Cancellation Note  Patient Details Name: Leslie [name] MRN: [id number] DOB: 12-Nov-1941   Cancelled Treatment:    Reason Eval/Treat Not Completed: Patient at procedure or test/ unavailable (IR for thrombectomy)  Elliot Cousin 11/03/2022, 3:18 PM

## 2022-11-03 NOTE — Code Documentation (Signed)
Stroke Response Nurse Documentation Code Documentation  Leslie [name] is a 81 y.o. female who was on 3E-24 with past medical hx of atrial fibrillation with RVR. On Eliquis (apixaban) daily. Code stroke was activated by RN   Patient from 3E-24 where she was LKW at 1350 and now complaining of right gaze deviation, left-sided hemiparesis and left-sided neglect . (  Stroke team at the bedside on patient arrival. . Patient to CT with team. NIHSS 19, see documentation for details and code stroke times. Patient with disoriented, right gaze preference , left hemianopia, left facial droop, left arm weakness, left leg weakness, left decreased sensation, Expressive aphasia , dysarthria , and Sensory  neglect on exam. The following imaging was completed:  CT Head and CTA. Patient is a candidate for IV Thrombolytic due to receiving her eliquis today. 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   Care Plan: Patient taken to IR for thrombectomy.   Bedside handoff with ED RN Beverlee Nims.    Leshara  Stroke Response RN

## 2022-11-03 NOTE — Anesthesia Procedure Notes (Signed)
Arterial Line Insertion Start/End11/25/2023 2:55 PM, 11/03/2022 3:00 PM Performed by: Reece Agar, CRNA  Patient location: OOR procedure area. Preanesthetic checklist: patient identified and IV checked Emergency situation Lidocaine 1% used for infiltration Left, radial was placed Catheter size: 20 G Hand hygiene performed , maximum sterile barriers used  and Seldinger technique used Allen's test indicative of satisfactory collateral circulation Attempts: 1 Procedure performed without using ultrasound guided technique. Following insertion, dressing applied and Biopatch. Post procedure assessment: normal and unchanged  Patient tolerated the procedure well with no immediate complications.

## 2022-11-03 NOTE — Progress Notes (Signed)
PROGRESS NOTE    Leslie [name]  MMH:[id number] DOB: [birth year] DOA: 10/29/2022 PCP: Sonia Side., FNP  Chief Complaint  Patient presents with   new onset atrial fib    Brief Narrative:  This is Leslie [name] 81 year old female with past medical history of hypothyroidism and GERD.  In October, the patient had Leslie [name] series of unfortunate events.  Her bathroom blocked up, [employer] came and tore her floors up.  The smells irritates her respiratory passages and caused nausea and vomiting.  She went on to develop Leslie [name] hacking nonproductive cough.  She saw her PCP, he started on 2 antibiotics and p.o. steroids.  She states she did not get better.  Additionally, she developed palpitations that were worse at night.  She was short of breath, and had Leslie [name] mild wheeze.  She denies fever, chills.  She endorse some lightheadedness and dizziness and was very weak.  She had no appetite..  She returned to her PCP today where EKG was done revealing Leslie [name]-fib with RVR.  The patient was directed to the ER.  Patient has no prior history of atrial fibrillation.   She was admitted with new onset atrial fibrillation in the setting of CAP.  She's gradually improved with IV abx.  Required thoracentesis for R sided effusion.  Rates have been difficult to control.  Adjusting diltiazem per cardiology.  Possible discharge 11/26 pending improvement in rate control.  Assessment & Plan:   Principal Problem:   Atrial fibrillation with rapid ventricular response ([name]) Active Problems:   Community acquired pneumonia of left lower lobe of lung   Pleural effusion  Acute Pulmonary Edema  HFmrEF with Exacberbation  Mitral Valve Regurgitation Suspect related to RVR Clinically doesn't look that overloaded, but does have bibasilar crackles.  CXR 11/22 with increased bilateral interstitial opacities, new bilateral effusions.  Stable R basilar opacity. Had acute SOB 11/22 and orthopnea related to this, improved with lasix Transition to PO  lasix 11/26 Strict I/O, daily weights  Appreciate cardiology recommendations  Atrial fibrillation with RVR New onset in the setting of pneumonia Continued RVR today - uptitrate dilt as tolerated Chadsvasc 6 (age, sex, HF, hx TIA) eliquis TSH wnl Echo with mildly decreased EF 40-45%, moderate asymmetric LVH, moderate to severe MVR Appreciate cardiology assistance -> Consider outpatient cardioversion. Diltiazem per cardiology.  Community Acquired Pneumonia  Right Sided Pleural Effusion  Leukocytosis She was treated outpatient with courses of cefdinir and azithromycin clinically improving, WBC fluctuating CT showing R lower lobe consolidation with Leslie [name] moderate right pleural effusion, tree in bud nodular densities in the right upper lobe CXR 11/22 with bibasilar atelectasis and probable right basilar infiltrate Currently on azithromycin and ceftriaxone, will continue for 7 day course Follow MRSA PCR (negative), sputum cx if able, urine strep (negative), urine legionella (negative) Blood cultures NGTD x2 S/p thoracentesis on 11/21 -> post procedure had questionable apical pneumothorax, resolved on 11/22 CXR Effusion transudative per lights criteria -> gram stain without WBC or organisms, follow culture (Ngx3).  Follow cytology (no malignant cells identified, reactive mesothelial cells present).  Emphysema Needs outpatient PFTs if residual SOB  Bronchomalacia Outpatient follow up if she develops chronic pulm symptoms  Normocytic Anemia Labs suggestive of AOCD, low normal B12 Follow methylmalonic acid, start b12 supplementation  Prediabetes Outpatient follow up  Hypothyroidism Synthroid  GERD Continue pepcid    DVT prophylaxis: heparin gtt -> eliquis Code Status: full Family Communication: none Disposition:   Status is: Inpatient Remains inpatient appropriate because: awaiting cardiology eval, pulmonology  c/s for thora   Consultants:   Cardiology pulmonology  Procedures:  Echo IMPRESSIONS     1. Left ventricular ejection fraction, by estimation, is 40 to 45%. The  left ventricle has mildly decreased function. The left ventricle  demonstrates global hypokinesis. There is moderate asymmetric left  ventricular hypertrophy of the basal-septal  segment. Left ventricular diastolic parameters are indeterminate.   2. Right ventricular systolic function is normal. The right ventricular  size is normal. There is normal pulmonary artery systolic pressure.   3. Left atrial size was mildly dilated.   4. The mitral valve is abnormal. Moderate to severe mitral valve  regurgitation. No evidence of mitral stenosis.   5. The aortic valve was not well visualized. Aortic valve regurgitation  is trivial. No aortic stenosis is present.   6. The inferior vena cava is dilated in size with >50% respiratory  variability, suggesting right atrial pressure of 8 mmHg.   Comparison(s): MR is worse from prior report.   Antimicrobials:  Anti-infectives (From admission, onward)    Start     Dose/Rate Route Frequency Ordered Stop   10/30/22 1000  cefTRIAXone (ROCEPHIN) 2 g in sodium chloride 0.9 % 100 mL IVPB        2 g 200 mL/hr over 30 Minutes Intravenous Every 24 hours 10/30/22 0222 11/06/22 0959   10/30/22 1000  azithromycin (ZITHROMAX) tablet 500 mg        500 mg Oral Daily 10/30/22 0222 11/03/22 0824   10/29/22 1715  cefTRIAXone (ROCEPHIN) 1 g in sodium chloride 0.9 % 100 mL IVPB        1 g 200 mL/hr over 30 Minutes Intravenous  Once 10/29/22 1705 10/29/22 1910   10/29/22 1715  azithromycin (ZITHROMAX) 500 mg in sodium chloride 0.9 % 250 mL IVPB        500 mg 250 mL/hr over 60 Minutes Intravenous  Once 10/29/22 1705 10/29/22 2022       Subjective: No new complaints  Objective: Vitals:   11/02/22 2344 11/03/22 0504 11/03/22 0727 11/03/22 1109  BP: (!) 116/58 98/70 116/71 100/65  Pulse: (!) 104 70 78 60  Resp: '20 20 20 16   '$ Temp: (!) 97.4 F (36.3 C) (!) 97.4 F (36.3 C) (!) 97.3 F (36.3 C) 97.6 F (36.4 C)  TempSrc: Oral Oral Oral Oral  SpO2: 100% 98% 100% 99%  Weight:  74.7 kg    Height:        Intake/Output Summary (Last 24 hours) at 11/03/2022 1338 Last data filed at 11/03/2022 1300 Gross per 24 hour  Intake 1160 ml  Output 1350 ml  Net -190 ml   Filed Weights   11/01/22 0050 11/01/22 2351 11/03/22 0504  Weight: 75.6 kg 75.5 kg 74.7 kg    Examination:  General: No acute distress. Cardiovascular: RRR Lungs: continued bibasilar crackles Abdomen: Soft, nontender, nondistended  Neurological: Alert and oriented 3. Moves all extremities 4. Cranial nerves II through XII grossly intact. Extremities: No clubbing or cyanosis. No edema.   Data Reviewed: I have personally reviewed following labs and imaging studies  CBC: Recent Labs  Lab 10/29/22 1552 10/30/22 0036 10/31/22 0108 11/01/22 0038 11/02/22 0030 11/03/22 0047  WBC 13.3* 10.8* 14.4* 17.0* 13.9* 14.3*  NEUTROABS 8.8*  --  11.4* 11.7*  --   --   HGB 10.5* 10.6* 9.9* 11.3* 12.1 12.5  HCT 33.9* 33.0* 32.9* 36.2 37.3 39.3  MCV 89.9 87.8 91.4 88.7 87.4 88.3  PLT 333 298 327 328 282  017    Basic Metabolic Panel: Recent Labs  Lab 10/29/22 1552 10/30/22 0354 10/31/22 0108 11/01/22 0038 11/02/22 0030 11/03/22 0047  NA 141 140 139 137 142 140  K 4.0 5.1 4.3 3.7 3.2* 4.3  CL 110 111 109 101 101 101  CO2 20* 18* 17* '25 29 29  '$ GLUCOSE 123* 179* 134* 151* 109* 140*  BUN 24* 22 29* 32* 27* 23  CREATININE 1.03* 0.98 1.07* 1.26* 1.25* 1.25*  CALCIUM 9.1 9.0 9.0 8.8* 9.0 9.3  MG 2.2  --  2.2 2.3 2.2 2.5*  PHOS  --   --  3.3 3.5 3.9 4.0    GFR: Estimated Creatinine Clearance: 35.6 mL/min (Leslie [name]) (by C-G formula based on SCr of 1.25 mg/dL (H)).  Liver Function Tests: Recent Labs  Lab 10/31/22 0108 11/01/22 0038  AST 47* 36  ALT 64* 63*  ALKPHOS 89 84  BILITOT 0.4 0.4  PROT 6.6 6.7  ALBUMIN 3.1* 3.1*    CBG: No  results for input(s): "GLUCAP" in the last 168 hours.   Recent Results (from the past 240 hour(s))  Resp Panel by RT-PCR (Flu Leslie [name]&B, Covid) Anterior Nasal Swab     Status: None   Collection Time: 10/29/22  4:36 PM   Specimen: Anterior Nasal Swab  Result Value Ref Range Status   SARS Coronavirus 2 by RT PCR NEGATIVE NEGATIVE Final    Comment: (NOTE) SARS-CoV-2 target nucleic acids are NOT DETECTED.  The SARS-CoV-2 RNA is generally detectable in upper respiratory specimens during the acute phase of infection. The lowest concentration of SARS-CoV-2 viral copies this assay can detect is 138 copies/mL. Leslie [name] negative result does not preclude SARS-Cov-2 infection and should not be used as the sole basis for treatment or other patient management decisions. Leslie [name] negative result may occur with  improper specimen collection/handling, submission of specimen other than nasopharyngeal swab, presence of viral mutation(s) within the areas targeted by this assay, and inadequate number of viral copies(<138 copies/mL). Leslie [name] negative result must be combined with clinical observations, patient history, and epidemiological information. The expected result is Negative.  Fact Sheet for Patients:  EntrepreneurPulse.com.au  Fact Sheet for Healthcare Providers:  IncredibleEmployment.be  This test is no t yet approved or cleared by the [place] FDA and  has been authorized for detection and/or diagnosis of SARS-CoV-2 by FDA under an Emergency Use Authorization (EUA). This EUA will remain  in effect (meaning this test can be used) for the duration of the COVID-19 declaration under Section 564(b)(1) of the Act, 21 U.S.C.section 360bbb-3(b)(1), unless the authorization is terminated  or revoked sooner.       Influenza Leslie [name] by PCR NEGATIVE NEGATIVE Final   Influenza B by PCR NEGATIVE NEGATIVE Final    Comment: (NOTE) The Xpert Xpress SARS-CoV-2/FLU/RSV plus assay is intended as  an aid in the diagnosis of influenza from Nasopharyngeal swab specimens and should not be used as Leslie [name] sole basis for treatment. Nasal washings and aspirates are unacceptable for Xpert Xpress SARS-CoV-2/FLU/RSV testing.  Fact Sheet for Patients: EntrepreneurPulse.com.au  Fact Sheet for Healthcare Providers: IncredibleEmployment.be  This test is not yet approved or cleared by the [place] FDA and has been authorized for detection and/or diagnosis of SARS-CoV-2 by FDA under an Emergency Use Authorization (EUA). This EUA will remain in effect (meaning this test can be used) for the duration of the COVID-19 declaration under Section 564(b)(1) of the Act, 21 U.S.C. section 360bbb-3(b)(1), unless the authorization is terminated or revoked.  Performed at [facility], [address] [place], [place] [address]   Blood culture (routine x 2)     Status: None   Collection Time: 10/29/22  5:30 PM   Specimen: BLOOD  Result Value Ref Range Status   Specimen Description   Final    BLOOD RIGHT ANTECUBITAL Performed at [facility], [address] [place], [place] [address]    Special Requests   Final    Blood Culture adequate volume BOTTLES DRAWN AEROBIC AND ANAEROBIC Performed at [facility], [address] [place], [place] [address]    Culture   Final    NO GROWTH 5 DAYS Performed at [facility], [place] [address] [place], [place] [address]    Report Status 11/03/2022 FINAL  Final  Blood culture (routine x 2)     Status: None   Collection Time: 10/29/22  6:10 PM   Specimen: BLOOD  Result Value Ref Range Status   Specimen Description   Final    BLOOD BLOOD RIGHT FOREARM Performed at [facility], [address], [place], [place] [address]    Special Requests   Final    Blood Culture adequate volume BOTTLES DRAWN AEROBIC ONLY Performed at [facility], [address] [place], [place] [address]    Culture   Final    NO GROWTH 5 DAYS Performed at [facility], [place] [address], [place], [place] [address]    Report Status 11/03/2022 FINAL  Final  MRSA Next Gen by PCR, Nasal     Status: None   Collection Time: 10/30/22  7:40 AM   Specimen: Nasal Mucosa; Nasal Swab  Result Value Ref Range Status   MRSA by PCR Next Gen NOT DETECTED NOT DETECTED Final    Comment: (NOTE) The GeneXpert MRSA Assay (FDA approved for NASAL specimens only), is one component of Leslie [name] comprehensive MRSA colonization surveillance program. It is not intended to diagnose MRSA infection nor to guide or monitor treatment for MRSA infections. Test performance is not FDA approved in patients less than 31 years old. Performed at [facility], [place] [address], [place], [place] [address]   Body fluid culture w Gram Stain     Status: None   Collection Time: 10/30/22  3:51 PM   Specimen: Pleural Fluid  Result Value Ref Range Status   Specimen Description PLEURAL  Final   Special Requests RIGHT LUNG  Final   Gram Stain NO WBC SEEN NO ORGANISMS SEEN CYTOSPIN SMEAR   Final   Culture   Final    NO GROWTH 3 DAYS Performed at [facility], [place] [address] [place], [place] [address]    Report Status 11/02/2022 FINAL  Final         Radiology Studies: No results found.      Scheduled Meds:  apixaban  5 mg Oral BID   vitamin B-12  1,000 mcg Oral Daily   [START ON 11/04/2022] diltiazem  360 mg Oral Daily   furosemide  40 mg Oral Daily   levothyroxine  88 mcg Oral Q0600   Continuous Infusions:  cefTRIAXone (ROCEPHIN)  IV 2 g (11/03/22 0822)     LOS: 4 days    Time spent: over 30 min    Fayrene Helper, MD [facility]   To contact the attending provider between 7A-7P or the covering provider during after hours 7P-7A, please log into the web site [email or web address] and access using universal [place] password for  that  web site. If you do not have the password, please call the hospital operator.  11/03/2022, 1:38 PM

## 2022-11-03 NOTE — Anesthesia Procedure Notes (Signed)
Procedure Name: Intubation Date/Time: 11/03/2022 3:06 PM  Performed by: Reece Agar, CRNAPre-anesthesia Checklist: Patient identified, Emergency Drugs available, Suction available and Patient being monitored Patient Re-evaluated:Patient Re-evaluated prior to induction Oxygen Delivery Method: Circle System Utilized Preoxygenation: Pre-oxygenation with 100% oxygen Induction Type: IV induction, Rapid sequence and Cricoid Pressure applied Laryngoscope Size: Mac and 3 Grade View: Grade I Tube type: Oral Tube size: 7.0 mm Number of attempts: 1 Airway Equipment and Method: Stylet Placement Confirmation: ETT inserted through vocal cords under direct vision, positive ETCO2 and breath sounds checked- equal and bilateral Secured at: 20 cm Tube secured with: Tape Dental Injury: Teeth and Oropharynx as per pre-operative assessment

## 2022-11-03 NOTE — Progress Notes (Signed)
PT Cancellation Note  Patient Details Name: Leslie [name] MRN: [id number] DOB: [birth year]   Cancelled Treatment:    Reason Eval/Treat Not Completed: Medical issues which prohibited therapy this afternoon, pt with acute onset R-sided gaze deviation, L-sided hemiparesis and L-sided neglect. Pt taken for IR thrombectomy, PT will continue to follow and re-evaluate as appropriate.   West Carbo, PT, DPT   Acute Rehabilitation Department   Sandra Cockayne 11/03/2022, 3:43 PM

## 2022-11-03 NOTE — Consult Note (Addendum)
Stroke Neurology Consultation Note  Consult Requested by: Dr. Marcelline Deist  Reason for Consult: Right MCA stroke  Consult Date:  11/03/22  The history was obtained from the patient, RN and chart.  During history and examination, all items were able to obtain unless otherwise noted.  History of Present Illness:  Leslie [name] is an 81 y.o. Caucasian female with PMH of hypothyroidism and GERD who was originally admitted for community-acquired pneumonia and atrial fibrillation with RVR.  Patient was recently started on Eliquis, and pneumonia was improving with antibiotics.  Today, after she went to the restroom, she developed sudden onset right gaze deviation, left-sided hemiparesis and left-sided neglect.  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  Patient was taken to IR for mechanical thrombectomy  Date last known well: Date: 11/03/2022 Time last known well: Time: 13:50 TNK Given: No: Patient fully anticoagulated with Eliquis MRS:  0 NIHSS:   1a Level of Conscious.: 0 1b LOC Questions: 1 1c LOC Commands: 0 2 Best Gaze: 1 3 Visual: 2 4 Facial Palsy: 2 5a Motor Arm - left: 4 5b Motor Arm - Right: 0 6a Motor Leg - Left: 2 6b Motor Leg - Right: 0 7 Limb Ataxia: 0 8 Sensory: 2 9 Best Language: 1 10 Dysarthria: 2 11 Extinct and Inattention.: 2 TOTAL: 19     Past Medical History:  Diagnosis Date   Arthritis    Chronic kidney disease    "stage 3 kidney failure" improving per pt   Headache    Hypothyroidism    Insomnia    Plantar fasciitis    Stomach ulcer ~2000   Stroke [facility])    TIA X2   2001, jan 2017   Vitamin D deficiency      Past Surgical History:  Procedure Laterality Date   ABDOMINAL HYSTERECTOMY  1974   ANKLE FUSION Right 06/07/2016   Procedure: RIGHT TALONAVICULAR ARTHRODESIS ;  Surgeon: Wylene Simmer, MD;  Location: [place];  Service: Orthopedics;  Laterality: Right;   APPENDECTOMY     BACK SURGERY     x4   fusion T12 thru sacrum; U rod in sacrum   CATARACT EXTRACTION, BILATERAL Bilateral 2015   GASTROCNEMIUS RECESSION Right 06/07/2016   Procedure: RIGHT GASTROC RECESSION ;  Surgeon: Wylene Simmer, MD;  Location: [place];  Service: Orthopedics;  Laterality: Right;   HAND SURGERY Left 2003   x2   L heel surgery for torn tendon Left 2015   PLANTAR FASCIA SURGERY Right 2009   surgery at [place] Right 2007   TENDON REPAIR Right 1949   foot- lacerated tendon in arch of foot   THORACENTESIS N/A 10/30/2022   Procedure: Mathews Robinsons;  Surgeon: Julian Hy, DO;  Location: [place];  Service: Cardiopulmonary;  Laterality: N/A;   TOTAL HIP ARTHROPLASTY Left 10/05/2021   Procedure: TOTAL HIP ARTHROPLASTY ANTERIOR APPROACH;  Surgeon: Rod Can, MD;  Location: [facility];  Service: Orthopedics;  Laterality: Left;    Family History  Problem Relation Age of Onset   Hypertension Mother    Lung cancer Mother    Lung cancer Father    Diabetes Sister    Hypertension Sister    Congestive Heart Failure Sister    Arthritis Brother      Social History:  reports that she quit smoking about 36 years ago. Her smoking use included cigarettes. She has never used smokeless tobacco. She reports that she  does not drink alcohol and does not use drugs.  Review of Systems: A full ROS was attempted today and was not able to be performed due to altered mental status.    Allergies:  Allergies  Allergen Reactions   Duloxetine Nausea Only   Voltaren [Diclofenac Sodium] Shortness Of Breath and Palpitations   Lisinopril Other (See Comments)   Aspirin Other (See Comments)    ulcers   Diphenhydramine Other (See Comments)   Gabapentin Itching   Ultram [Tramadol Hcl] Other (See Comments)    Effects cognitive abilities    Proton Pump Inhibitors Rash    Itching, rash     Medications: Scheduled:  [START ON 11/04/2022]  stroke: early stages of recovery book   Does not apply  Once   amiodarone  150 mg Intravenous Once   vitamin B-12  1,000 mcg Oral Daily   [START ON 11/04/2022] diltiazem  360 mg Oral Daily   fentaNYL       furosemide  40 mg Oral Daily   levothyroxine  88 mcg Oral Q0600    Test Results: CBC:  Recent Labs  Lab 10/31/22 0108 11/01/22 0038 11/02/22 0030 11/03/22 0047  WBC 14.4* 17.0* 13.9* 14.3*  NEUTROABS 11.4* 11.7*  --   --   HGB 9.9* 11.3* 12.1 12.5  HCT 32.9* 36.2 37.3 39.3  MCV 91.4 88.7 87.4 88.3  PLT 327 328 282 604   Basic Metabolic Panel:  Recent Labs  Lab 11/02/22 0030 11/03/22 0047  NA 142 140  K 3.2* 4.3  CL 101 101  CO2 29 29  GLUCOSE 109* 140*  BUN 27* 23  CREATININE 1.25* 1.25*  CALCIUM 9.0 9.3  MG 2.2 2.5*  PHOS 3.9 4.0   Liver Function Tests: Recent Labs  Lab 10/31/22 0108 11/01/22 0038  AST 47* 36  ALT 64* 63*  ALKPHOS 89 84  BILITOT 0.4 0.4  PROT 6.6 6.7  ALBUMIN 3.1* 3.1*   No results for input(s): "LIPASE", "AMYLASE" in the last 168 hours. No results for input(s): "AMMONIA" in the last 168 hours. Coagulation Studies: No results for input(s): "LABPROT", "INR" in the last 72 hours. Cardiac Enzymes: No results for input(s): "CKTOTAL", "CKMB", "CKMBINDEX", "TROPONINI" in the last 168 hours. BNP: Invalid input(s): "POCBNP" CBG:  Recent Labs  Lab 11/03/22 1357  GLUCAP 149*   Urinalysis: No results for input(s): "COLORURINE", "LABSPEC", "PHURINE", "GLUCOSEU", "HGBUR", "BILIRUBINUR", "KETONESUR", "PROTEINUR", "UROBILINOGEN", "NITRITE", "LEUKOCYTESUR" in the last 168 hours.  Invalid input(s): "APPERANCEUR" Microbiology:  Results for orders placed or performed during the hospital encounter of 10/29/22  Resp Panel by RT-PCR (Flu A&B, Covid) Anterior Nasal Swab     Status: None   Collection Time: 10/29/22  4:36 PM   Specimen: Anterior Nasal Swab  Result Value Ref Range Status   SARS Coronavirus 2 by RT PCR NEGATIVE NEGATIVE Final    Comment: (NOTE) SARS-CoV-2 target nucleic acids are NOT  DETECTED.  The SARS-CoV-2 RNA is generally detectable in upper respiratory specimens during the acute phase of infection. The lowest concentration of SARS-CoV-2 viral copies this assay can detect is 138 copies/mL. A negative result does not preclude SARS-Cov-2 infection and should not be used as the sole basis for treatment or other patient management decisions. A negative result may occur with  improper specimen collection/handling, submission of specimen other than nasopharyngeal swab, presence of viral mutation(s) within the areas targeted by this assay, and inadequate number of viral copies(<138 copies/mL). A negative result must be combined with clinical observations, patient  history, and epidemiological information. The expected result is Negative.  Fact Sheet for Patients:  EntrepreneurPulse.com.au  Fact Sheet for Healthcare Providers:  IncredibleEmployment.be  This test is no t yet approved or cleared by the [place] FDA and  has been authorized for detection and/or diagnosis of SARS-CoV-2 by FDA under an Emergency Use Authorization (EUA). This EUA will remain  in effect (meaning this test can be used) for the duration of the COVID-19 declaration under Section 564(b)(1) of the Act, 21 U.S.C.section 360bbb-3(b)(1), unless the authorization is terminated  or revoked sooner.       Influenza A by PCR NEGATIVE NEGATIVE Final   Influenza B by PCR NEGATIVE NEGATIVE Final    Comment: (NOTE) The Xpert Xpress SARS-CoV-2/FLU/RSV plus assay is intended as an aid in the diagnosis of influenza from Nasopharyngeal swab specimens and should not be used as a sole basis for treatment. Nasal washings and aspirates are unacceptable for Xpert Xpress SARS-CoV-2/FLU/RSV testing.  Fact Sheet for Patients: EntrepreneurPulse.com.au  Fact Sheet for Healthcare Providers: IncredibleEmployment.be  This test is not yet  approved or cleared by the [place] FDA and has been authorized for detection and/or diagnosis of SARS-CoV-2 by FDA under an Emergency Use Authorization (EUA). This EUA will remain in effect (meaning this test can be used) for the duration of the COVID-19 declaration under Section 564(b)(1) of the Act, 21 U.S.C. section 360bbb-3(b)(1), unless the authorization is terminated or revoked.  Performed at [facility], [address] [place], [place] [address]   Blood culture (routine x 2)     Status: None   Collection Time: 10/29/22  5:30 PM   Specimen: BLOOD  Result Value Ref Range Status   Specimen Description   Final    BLOOD RIGHT ANTECUBITAL Performed at [facility], [address], [place], [place] [address]    Special Requests   Final    Blood Culture adequate volume BOTTLES DRAWN AEROBIC AND ANAEROBIC Performed at [facility], [address] [place], [place] [address]    Culture   Final    NO GROWTH 5 DAYS Performed at [facility], [place] [address], [place], [place] [address]    Report Status 11/03/2022 FINAL  Final  Blood culture (routine x 2)     Status: None   Collection Time: 10/29/22  6:10 PM   Specimen: BLOOD  Result Value Ref Range Status   Specimen Description   Final    BLOOD BLOOD RIGHT FOREARM Performed at [facility], [address] [place], [place] [address]    Special Requests   Final    Blood Culture adequate volume BOTTLES DRAWN AEROBIC ONLY Performed at [facility], [address], [place], [place] [address]    Culture   Final    NO GROWTH 5 DAYS Performed at [facility], [place] [address], [place], [place] [address]    Report Status 11/03/2022 FINAL  Final  MRSA Next Gen by PCR, Nasal     Status: None   Collection Time: 10/30/22  7:40 AM   Specimen: Nasal Mucosa; Nasal Swab  Result Value Ref Range Status   MRSA by PCR Next Gen  NOT DETECTED NOT DETECTED Final    Comment: (NOTE) The GeneXpert MRSA Assay (FDA approved for NASAL specimens only), is one component of a comprehensive MRSA colonization surveillance program. It is not intended to diagnose MRSA infection nor to guide or monitor treatment for MRSA infections. Test performance is not  FDA approved in patients less than 31 years old. Performed at [facility], [place] [address] [place], [place] [address]   Body fluid culture w Gram Stain     Status: None   Collection Time: 10/30/22  3:51 PM   Specimen: Pleural Fluid  Result Value Ref Range Status   Specimen Description PLEURAL  Final   Special Requests RIGHT LUNG  Final   Gram Stain NO WBC SEEN NO ORGANISMS SEEN CYTOSPIN SMEAR   Final   Culture   Final    NO GROWTH 3 DAYS Performed at [facility], [place] [address], [place],  [address]    Report Status 11/02/2022 FINAL  Final   Lipid Panel:     Component Value Date/Time   CHOL 151 10/31/2022 0108   TRIG 61 10/31/2022 0108   HDL 53 10/31/2022 0108   CHOLHDL 2.8 10/31/2022 0108   VLDL 12 10/31/2022 0108   LDLCALC 86 10/31/2022 0108   HgbA1c:  Lab Results  Component Value Date   HGBA1C 6.2 (H) 10/30/2022   Urine Drug Screen: No results found for: "LABOPIA", "COCAINSCRNUR", "LABBENZ", "AMPHETMU", "THCU", "LABBARB"  Alcohol Level: No results for input(s): "ETH" in the last 168 hours.  CT HEAD CODE STROKE WO CONTRAST  Result Date: 11/03/2022 CLINICAL DATA:  Left-sided deficit and facial droop. EXAM: CT ANGIOGRAPHY HEAD AND NECK TECHNIQUE: Multidetector CT imaging of the head and neck was performed using the standard protocol during bolus administration of intravenous contrast. Multiplanar CT image reconstructions and MIPs were obtained to evaluate the vascular anatomy. Carotid stenosis measurements (when applicable) are obtained utilizing NASCET criteria, using the distal internal carotid diameter as the denominator. RADIATION  DOSE REDUCTION: This exam was performed according to the departmental dose-optimization program which includes automated exposure control, adjustment of the mA and/or kV according to patient size and/or use of iterative reconstruction technique. CONTRAST:  61m OMNIPAQUE IOHEXOL 350 MG/ML SOLN COMPARISON:  Brain MRI 12/30/2015 FINDINGS: CT HEAD FINDINGS Brain: There is no evidence of acute intracranial hemorrhage, extra-axial fluid collection, or acute territorial infarct. ASPECTS is 10. There is moderate background parenchymal volume loss with prominence of the ventricular system and extra-axial CSF spaces. The ventricles are similar in size to 2017. Extensive confluent hypodensity throughout the supratentorial white matter likely reflects sequela of underlying chronic small-vessel ischemic change There is no mass lesion.  There is no mass effect or midline shift. Vascular: See below. Skull: Normal. Negative for fracture or focal lesion. Sinuses/Orbits: The paranasal sinuses are clear. Bilateral lens implants are in place. The globes and orbits are otherwise unremarkable. Other: None. Review of the MIP images confirms the above findings CTA NECK FINDINGS Aortic arch: The imaged aortic arch is normal. The origins of the left common carotid and subclavian arteries are patent. The brachiocephalic artery origin is not imaged. The subclavian arteries are patent to the level Right carotid system: Imaged. The right common, internal, and external carotid arteries are patent, without hemodynamically significant stenosis or occlusion. There is no dissection or aneurysm. Left carotid system: The left common, internal, and external carotid arteries are patent, without hemodynamically significant stenosis or occlusion. There is no dissection or aneurysm. Vertebral arteries: The vertebral arteries are patent, without hemodynamically significant stenosis or occlusion. There is no dissection or aneurysm. Skeleton: There is no  acute osseous abnormality or suspicious osseous lesion. There is no visible canal hematoma. Other neck: The soft tissues of the neck are unremarkable. Upper chest: The imaged lung apices are  clear. Review of the MIP images confirms the above findings CTA HEAD FINDINGS Anterior circulation: The intracranial ICAs are patent. There is a right M1 occlusion. An inferior right M2 branch reconstitutes in the sylvian fissure common but there is overall poor collateral flow in the MCA distribution. The left MCA branches are patent, without proximal stenosis or occlusion. The bilateral ACAs are patent, without proximal stenosis or occlusion. The anterior communicating artery is normal. There is no aneurysm or AVM. Posterior circulation: The bilateral V4 segments are patent. The basilar artery is patent. The major cerebellar arteries appear patent. The bilateral PCAs are patent, without proximal stenosis or occlusion. Posterior communicating arteries are not definitely seen. There is no aneurysm or AVM. Venous sinuses: As permitted by contrast timing, patent. Anatomic variants: None. Review of the MIP images confirms the above findings IMPRESSION: 1. No acute intracranial pathology on initial noncontrast head CT. ASPECTS is 10. 2. 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 3. Otherwise patent vasculature of the head and neck without other hemodynamically significant stenosis or occlusion. Results of the initial noncontrast head CT were paged via Amion to Dr. Erlinda Hong at 2:35 pm. CTA results were discussed at 2:38 pm. Electronically Signed   By: Valetta Mole [name]   On: 11/03/2022 14:44   CT ANGIO HEAD NECK W WO CM (CODE STROKE)  Result Date: 11/03/2022 CLINICAL DATA:  Left-sided deficit and facial droop. EXAM: CT ANGIOGRAPHY HEAD AND NECK TECHNIQUE: Multidetector CT imaging of the head and neck was performed using the standard protocol during bolus administration of intravenous contrast. Multiplanar CT  image reconstructions and MIPs were obtained to evaluate the vascular anatomy. Carotid stenosis measurements (when applicable) are obtained utilizing NASCET criteria, using the distal internal carotid diameter as the denominator. RADIATION DOSE REDUCTION: This exam was performed according to the departmental dose-optimization program which includes automated exposure control, adjustment of the mA and/or kV according to patient size and/or use of iterative reconstruction technique. CONTRAST:  70m OMNIPAQUE IOHEXOL 350 MG/ML SOLN COMPARISON:  Brain MRI 12/30/2015 FINDINGS: CT HEAD FINDINGS Brain: There is no evidence of acute intracranial hemorrhage, extra-axial fluid collection, or acute territorial infarct. ASPECTS is 10. There is moderate background parenchymal volume loss with prominence of the ventricular system and extra-axial CSF spaces. The ventricles are similar in size to 2017. Extensive confluent hypodensity throughout the supratentorial white matter likely reflects sequela of underlying chronic small-vessel ischemic change There is no mass lesion.  There is no mass effect or midline shift. Vascular: See below. Skull: Normal. Negative for fracture or focal lesion. Sinuses/Orbits: The paranasal sinuses are clear. Bilateral lens implants are in place. The globes and orbits are otherwise unremarkable. Other: None. Review of the MIP images confirms the above findings CTA NECK FINDINGS Aortic arch: The imaged aortic arch is normal. The origins of the left common carotid and subclavian arteries are patent. The brachiocephalic artery origin is not imaged. The subclavian arteries are patent to the level Right carotid system: Imaged. The right common, internal, and external carotid arteries are patent, without hemodynamically significant stenosis or occlusion. There is no dissection or aneurysm. Left carotid system: The left common, internal, and external carotid arteries are patent, without hemodynamically  significant stenosis or occlusion. There is no dissection or aneurysm. Vertebral arteries: The vertebral arteries are patent, without hemodynamically significant stenosis or occlusion. There is no dissection or aneurysm. Skeleton: There is no acute osseous abnormality or suspicious osseous lesion. There is no visible canal hematoma. Other  neck: The soft tissues of the neck are unremarkable. Upper chest: The imaged lung apices are clear. Review of the MIP images confirms the above findings CTA HEAD FINDINGS Anterior circulation: The intracranial ICAs are patent. There is a right M1 occlusion. An inferior right M2 branch reconstitutes in the sylvian fissure common but there is overall poor collateral flow in the MCA distribution. The left MCA branches are patent, without proximal stenosis or occlusion. The bilateral ACAs are patent, without proximal stenosis or occlusion. The anterior communicating artery is normal. There is no aneurysm or AVM. Posterior circulation: The bilateral V4 segments are patent. The basilar artery is patent. The major cerebellar arteries appear patent. The bilateral PCAs are patent, without proximal stenosis or occlusion. Posterior communicating arteries are not definitely seen. There is no aneurysm or AVM. Venous sinuses: As permitted by contrast timing, patent. Anatomic variants: None. Review of the MIP images confirms the above findings IMPRESSION: 1. No acute intracranial pathology on initial noncontrast head CT. ASPECTS is 10. 2. 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 3. Otherwise patent vasculature of the head and neck without other hemodynamically significant stenosis or occlusion. Results of the initial noncontrast head CT were paged via Amion to Dr. Erlinda Hong at 2:35 pm. CTA results were discussed at 2:38 pm. Electronically Signed   By: Valetta Mole [name]   On: 11/03/2022 14:44   DG CHEST PORT 1 VIEW  Result Date: 10/31/2022 CLINICAL DATA:  Shortness of  breath EXAM: PORTABLE CHEST 1 VIEW COMPARISON:  Chest x-ray dated October 31, 2022 FINDINGS: Cardiac and mediastinal contours are unchanged. Increased bilateral interstitial opacities. Similar right basilar opacity. Probable new small bilateral pleural effusions. No evidence of pneumothorax IMPRESSION: 1. Slightly increased bilateral interstitial opacities, likely due to worsened pulmonary edema. 2. Similar right basilar opacity which may be due to infection or aspiration. 3. Probable new small bilateral pleural effusions. Electronically Signed   By: Yetta Glassman [name]   On: 10/31/2022 15:57   DG CHEST PORT 1 VIEW  Result Date: 10/31/2022 CLINICAL DATA:  Post thoracentesis yesterday, shortness of breath, atrial fibrillation EXAM: PORTABLE CHEST 1 VIEW COMPARISON:  Portable exam 0606 hours compared to 10/30/2022 FINDINGS: Upper normal heart size. Mediastinal contours and pulmonary vascularity normal. Bibasilar atelectasis. Patchy opacity RIGHT base question infiltrate, little changed. Upper lungs clear. No pleural effusion or pneumothorax. 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 IMPRESSION: Bibasilar atelectasis and probable RIGHT basilar infiltrate. Electronically Signed   By: Lavonia Dana [name]   On: 10/31/2022 08:30   DG CHEST PORT 1 VIEW  Result Date: 10/30/2022 CLINICAL DATA:  Congestive heart failure status post thoracentesis EXAM: PORTABLE CHEST 1 VIEW COMPARISON:  Chest radiograph dated 10/29/2022, CTA chest dated 10/29/2022 FINDINGS: Normal lung volumes. Persistent confluence right lower lung opacity. Patchy left basilar opacity. No definite pleural effusion questionable trace right apical pneumothorax. The heart size and mediastinal contours are within normal limits. The visualized skeletal structures are unremarkable. IMPRESSION: 1. Persistent confluence right lower lung opacity, suspicious for pneumonia. 2. Patchy left basilar opacity, likely atelectasis. 3.  Questionable trace right apical pneumothorax. No definite pleural effusion. Recommend attention on follow-up. Electronically Signed   By: Darrin Nipper [name]   On: 10/30/2022 15:58   ECHOCARDIOGRAM COMPLETE  Result Date: 10/30/2022    ECHOCARDIOGRAM REPORT   Patient Name:   Leslie [name] Date of Exam: 10/30/2022 Medical Rec #:  [id number]  Height:       67.5 in Accession #:    [id number]          Weight:       171.5 lb Date of Birth:  [birth year]           BSA:          1.905 m Patient Age:    45 years            BP:           119/92 mmHg Patient Gender: F                   HR:           103 bpm. Exam Location:  Inpatient Procedure: 2D Echo, Color Doppler and Cardiac Doppler Indications:    I48.91* Unspecified atrial fibrillation  History:        Patient has prior history of Echocardiogram examinations, most                 recent 01/10/2016. Arrythmias:Atrial Fibrillation.  Sonographer:    Raquel Sarna Senior RDCS Referring Phys: [phone] A CALDWELL [place]  1. Left ventricular ejection fraction, by estimation, is 40 to 45%. The left ventricle has mildly decreased function. The left ventricle demonstrates global hypokinesis. There is moderate asymmetric left ventricular hypertrophy of the basal-septal segment. Left ventricular diastolic parameters are indeterminate.  2. Right ventricular systolic function is normal. The right ventricular size is normal. There is normal pulmonary artery systolic pressure.  3. Left atrial size was mildly dilated.  4. The mitral valve is abnormal. 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 No evidence of mitral stenosis.  5. The aortic valve was not well visualized. Aortic valve regurgitation is trivial. No aortic stenosis is present.  6. The inferior vena cava is dilated in size with >50% respiratory variability, suggesting right atrial pressure of 8 mmHg. Comparison(s): MR is worse from prior report. FINDINGS  Left Ventricle: Left ventricular ejection fraction,  by estimation, is 40 to 45%. The left ventricle has mildly decreased function. The left ventricle demonstrates global hypokinesis. The left ventricular internal cavity size was normal in size. There is  moderate asymmetric left ventricular hypertrophy of the basal-septal segment. Left ventricular diastolic parameters are indeterminate. Right Ventricle: The right ventricular size is normal. No increase in right ventricular wall thickness. Right ventricular systolic function is normal. There is normal pulmonary artery systolic pressure. The tricuspid regurgitant velocity is 2.72 m/s, and  with an assumed right atrial pressure of 3 mmHg, the estimated right ventricular systolic pressure is 66.2 mmHg. Left Atrium: Left atrial size was mildly dilated. Right Atrium: Right atrial size was normal in size. Pericardium: The pericardium was not well visualized. Mitral Valve: MR volume 52. The mitral valve is abnormal. Mild mitral annular calcification. 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 No evidence of mitral valve stenosis. Tricuspid Valve: The tricuspid valve is not well visualized. Tricuspid valve regurgitation is not demonstrated. No evidence of tricuspid stenosis. Aortic Valve: The aortic valve was not well visualized. Aortic valve regurgitation is trivial. No aortic stenosis is present. Pulmonic Valve: The pulmonic valve was not well visualized. Pulmonic valve regurgitation is not visualized. No evidence of pulmonic stenosis. Aorta: The aortic root, ascending aorta and aortic arch are all structurally normal, with no evidence of dilitation or obstruction. Venous: The inferior vena cava is dilated in size with greater than 50% respiratory variability, suggesting right atrial pressure of 8 mmHg. IAS/Shunts: No atrial level  shunt detected by color flow Doppler.  LEFT VENTRICLE PLAX 2D LVIDd:         4.20 cm LVIDs:         3.50 cm LV PW:         0.90 cm LV IVS:        1.25 cm LVOT diam:     2.10 cm LV SV:          40 LV SV Index:   21 LVOT Area:     3.46 cm  RIGHT VENTRICLE RV S prime:     10.30 cm/s TAPSE (M-mode): 1.3 cm LEFT ATRIUM             Index        RIGHT ATRIUM           Index LA diam:        3.80 cm 1.99 cm/m   RA Area:     13.50 cm LA Vol (A2C):   65.3 ml 34.28 ml/m  RA Volume:   31.30 ml  16.43 ml/m LA Vol (A4C):   76.4 ml 40.11 ml/m LA Biplane Vol: 75.7 ml 39.74 ml/m  AORTIC VALVE LVOT Vmax:   74.00 cm/s LVOT Vmean:  48.700 cm/s LVOT VTI:    0.116 m  AORTA Ao Root diam: 3.30 cm Ao Asc diam:  3.10 cm MR Peak grad:    84.6 mmHg    TRICUSPID VALVE MR Mean grad:    60.0 mmHg    TR Peak grad:   29.6 mmHg MR Vmax:         460.00 cm/s  TR Vmax:        272.00 cm/s MR Vmean:        370.5 cm/s MR PISA:         4.02 cm     SHUNTS MR PISA Eff ROA: 28 mm       Systemic VTI:  0.12 m MR PISA Radius:  0.80 cm      Systemic Diam: 2.10 cm Rudean Haskell MD Electronically signed by Rudean Haskell MD Signature Date/Time: 10/30/2022/10:54:26 AM    Final    CT Angio Chest PE W and/or Wo Contrast  Result Date: 10/29/2022 CLINICAL DATA:  Pulmonary embolism (PE) suspected, low to intermediate prob, positive D-dimer EXAM: CT ANGIOGRAPHY CHEST WITH CONTRAST TECHNIQUE: Multidetector CT imaging of the chest was performed using the standard protocol during bolus administration of intravenous contrast. Multiplanar CT image reconstructions and MIPs were obtained to evaluate the vascular anatomy. RADIATION DOSE REDUCTION: This exam was performed according to the departmental dose-optimization program which includes automated exposure control, adjustment of the mA and/or kV according to patient size and/or use of iterative reconstruction technique. CONTRAST:  60m OMNIPAQUE IOHEXOL 350 MG/ML SOLN COMPARISON:  None Available. FINDINGS: Cardiovascular: Heart is normal size. Aorta is normal caliber. Scattered coronary artery and aortic calcifications. No filling defects in the pulmonary arteries to suggest pulmonary  emboli. Mediastinum/Nodes: Mildly prominent mediastinal and bilateral hilar lymph nodes. No axillary adenopathy. Trachea and esophagus are unremarkable. Lungs/Pleura: Moderate right pleural effusion. Airspace disease in the right lower lobe compatible with pneumonia. Clustered tree-in-bud nodular densities in the right upper lobe also likely infectious. No confluent opacity on the left. Upper Abdomen: No acute findings Musculoskeletal: Chest wall soft tissues are unremarkable. No acute bony abnormality. Review of the MIP images confirms the above findings. IMPRESSION: Right lower lobe consolidation with moderate right pleural effusion. Tree-in-bud nodular densities in the right upper lobe. Findings compatible  with pneumonia. No evidence of pulmonary embolus. Coronary artery disease. Aortic Atherosclerosis (ICD10-I70.0). Electronically Signed   By: Rolm Baptise [name]   On: 10/29/2022 23:43   DG Chest Portable 1 View  Result Date: 10/29/2022 CLINICAL DATA:  Atrial fibrillation, palpitations EXAM: PORTABLE CHEST 1 VIEW COMPARISON:  10/23/2022 FINDINGS: Transverse diameter of heart is slightly increased. There are no signs of alveolar pulmonary edema. There is interval decrease in patchy infiltrates in right parahilar region and right lower lung field. New small patchy infiltrate is seen in the lateral aspect of left lower lung field. There is no pleural effusion or pneumothorax. IMPRESSION: There is interval decrease in patchy infiltrates in right mid and right lower lung fields suggesting resolving atelectasis/pneumonia. There is new small patchy infiltrate in left lower lung fields suggesting multifocal pneumonia. Electronically Signed   By: Elmer Picker [name]   On: 10/29/2022 16:37   DG Chest 2 View  Result Date: 10/25/2022 CLINICAL DATA:  Cough and shortness of breath. Recent diagnosis of pneumonia. EXAM: CHEST - 2 VIEW COMPARISON:  PA and lateral chest 10/05/2022. FINDINGS: Patchy airspace disease  in the right chest, worst lung base has progressed since the prior study. The left lung remains clear. Heart size is normal. No pneumothorax or pleural fluid. No acute bony abnormality. IMPRESSION: Worsened airspace disease in the right chest most consistent with progressive pneumonia. Electronically Signed   By: Inge Rise [name]   On: 10/25/2022 07:50   DG Chest 2 View  Result Date: 10/09/2022 CLINICAL DATA:  81 year old female with acute bronchitis EXAM: CHEST - 2 VIEW COMPARISON:  06/11/2005 FINDINGS: Cardiomediastinal silhouette unchanged in size and contour. No evidence of central vascular congestion. No interlobular septal thickening. Reticulonodular opacities at the right lung base new from the prior. No pneumothorax or pleural effusion. No acute displaced fracture. Degenerative changes of the spine. Surgical changes the lumbar region incompletely imaged IMPRESSION: Right-sided pneumonia. Followup PA and lateral chest X-ray is recommended in 3-4 weeks following therapy to assure resolution Electronically Signed   By: Corrie Mckusick D.O.   On: 10/09/2022 08:58     EKG: atrial fibrillation with RVR.   Physical Examination: Temp:  [97.3 F (36.3 C)-98.6 F (37 C)] 98.6 F (37 C) (11/25 1401) Pulse Rate:  [43-104] 61 (11/25 1401) Resp:  [14-20] 20 (11/25 1401) BP: (98-143)/(58-114) 143/114 (11/25 1401) SpO2:  [97 %-100 %] 99 % (11/25 1401) Weight:  [74.7 kg] 74.7 kg (11/25 0504)  General - Well nourished, well developed, in no apparent distress.  Cardiovascular -A-fib with RVR on monitor  Mental Status -  Patient noted to be alert and oriented to self, disoriented to time and situation.  Cranial Nerves II - XII - II -left hemianopia III, IV, VI -right gaze deviation, unable to cross midline VII -left-sided facial droop VIII - Hearing & vestibular intact bilaterally. X -voice somewhat dysarthric  Motor Strength -5 out of 5 strength in right upper extremity and right lower  extremity, left upper extremity flaccid, 2 out of 5 strength to left lower extremity  Sensory -sensory deficit noted on the left with extinction, sensation intact on right side of body  Coordination -finger-to-nose intact on the right  Gait and Station - deferred.   Assessment:  Leslie [name] is a 81 y.o. female with history of hypothyroidism and GERD who was originally admitted with community-acquired pneumonia and atrial fibrillation with RVR, recently placed on Eliquis.  This afternoon, she developed sudden onset left-sided weakness, right gaze  deviation, right facial droop and left-sided neglect.  Head CT revealed no bleed.  CTA head and neck revealed right M1 occlusion, and patient was taken to IR for mechanical thrombectomy.  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    CTA head & neck right MCA M1 occlusion MRI pending 2D Echo pending LDL 86 HgbA1c 6.2  Fully anticoagulated with Eliquis for atrial fibrillation, no VTE prophylaxis needed Diet Order             Diet NPO time specified  Diet effective now                   No antithrombotic prior to admission, now on Eliquis (apixaban) daily.  Therapy recommendations: Pending Disposition: Pending  Atrial fibrillation Patient was admitted with A-fib with RVR She has been fully anticoagulated with Eliquis Currently on IV amiodarone and p.o. Cardizem Cardiology following, appreciate input  Hypertension Stable Keep systolic blood pressure [phone] post thrombectomy for 24 hours Use Cleviprex if needed Long-term BP goal normotensive  Hyperlipidemia Home meds: None LDL 86, goal < 70 Add rosuvastatin 20 mg daily Continue statin at discharge  Diabetes type II, Controlled HgbA1c 6.2, goal < 7.0 CBGs SSI  Other Stroke Risk Factors Advanced age Former cigarette  smoker  Other Active Problems Community-acquired pneumonia-continue Rocephin 2 g daily  Hospital day # 4   Thank you for this consultation and allowing [place] to participate in the care of this patient.  [place] , MSN, AGACNP-BC [facility] See Amion for schedule and pager information 11/03/2022 3:27 PM   ATTENDING NOTE: I reviewed above note and agree with the assessment and plan. Pt was seen and examined.   88 F with no significant cardiovascular history found to have afib RVR recently and admitted for afib management. She is on eliquis during admission and last dose was this morning. Around 1:50pm she developed acute onset left facial droop, left sided weakness, left neglect and right gaze preference with moderate dysarthria. Code stroke activated. NIHSS = 19. CT head no acute finding. She is not TNK candidate given on eliquis. Her CTA head and neck showed right M1 occlusion. Discussed with daughter and consented for EVT. Code IR activated and pt was sent to IR for thrombectomy. She will be admitted to ICU post IR. I had long discussion with daughter at bedside, updated pt current condition, treatment plan and potential prognosis, and answered all the questions. She expressed understanding and appreciation.   For detailed assessment and plan, please refer to above/below as I have made changes wherever appropriate.   Rosalin Hawking, MD PhD Stroke Neurology 11/03/2022 5:00 PM  This patient is critically ill due to acute stroke needing thrombectomy, code stroke activation, afib RVR and at significant risk of neurological worsening, death form recurrent stroke, large stroke, hemorrhagic transformation, heart failure. This patient's care requires constant monitoring of vital signs, hemodynamics, respiratory and cardiac monitoring, review of multiple databases, neurological assessment, discussion with family, other specialists and medical decision making of high complexity. I  spent 60 minutes of neurocritical care time in the care of this patient.    To contact Stroke Continuity provider, please refer to [email or web address] After hours, contact General Neurology

## 2022-11-03 NOTE — Procedures (Signed)
INR  Status post bilateral common carotid arteriograms.  Right CFA approach.  Findings. 1. 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 Status post complete revascularization of occluded right middle cerebral artery M1 segment with one pass  with contact aspiration, and 4 mm x 40 mm Solitaire X retrieval device achieving a TICI 3 revascularization. Post CT of the head no evidence of hemorrhagic complications. 7 French exoseal closure device with manual compression using quick clot for 25 minutes applied for hemostasis of the right groin puncture site.  Distal pulses all dopplerable. Patient extubated.  Following simple commands appropriately.  Pupils to 3 mm bilaterally sluggish.  Able to bend her left leg.  No significant motor movement in the left upper extremity.  Arlean Hopping MD.   .

## 2022-11-03 NOTE — Anesthesia Preprocedure Evaluation (Signed)
Anesthesia Evaluation  Patient identified by MRN, date of birth, ID band Patient confused  Preop documentation limited or incomplete due to emergent nature of procedure.  Airway Mallampati: II   Neck ROM: full    Dental   Pulmonary former smoker   breath sounds clear to auscultation       Cardiovascular + dysrhythmias Atrial Fibrillation  Rhythm:irregular Rate:Tachycardia  Pt currently with AF and RVR   Neuro/Psych  Headaches CVA    GI/Hepatic   Endo/Other  Hypothyroidism    Renal/GU Renal InsufficiencyRenal disease     Musculoskeletal  (+) Arthritis ,    Abdominal   Peds  Hematology   Anesthesia Other Findings   Reproductive/Obstetrics                             Anesthesia Physical Anesthesia Plan  ASA: 4 and emergent  Anesthesia Plan: General   Post-op Pain Management:    Induction: Intravenous  PONV Risk Score and Plan: 3 and Ondansetron, Dexamethasone and Treatment may vary due to age or medical condition  Airway Management Planned: Oral ETT  Additional Equipment: Arterial line  Intra-op Plan:   Post-operative Plan: Possible Post-op intubation/ventilation  Informed Consent: I have reviewed the patients History and Physical, chart, labs and discussed the procedure including the risks, benefits and alternatives for the proposed anesthesia with the patient or authorized representative who has indicated his/her understanding and acceptance.     Dental advisory given  Plan Discussed with: CRNA, Anesthesiologist and Surgeon  Anesthesia Plan Comments:        Anesthesia Quick Evaluation

## 2022-11-03 NOTE — Progress Notes (Signed)
Patient unfortunately had Leslie [name] stroke.  Went to neuro IR for intervention.  Now on stroke team as primary.  Will sign off for now.  We'll be available to assume care when she's ready to come out of ICU.  Please let us know when we can be of further assistance.  TRH Fayrene Helper, MD

## 2022-11-04 DIAGNOSIS — I63411 Cerebral infarction due to embolism of right middle cerebral artery: Secondary | ICD-10-CM | POA: Diagnosis not present

## 2022-11-04 DIAGNOSIS — I4891 Unspecified atrial fibrillation: Secondary | ICD-10-CM | POA: Diagnosis not present

## 2022-11-04 LAB — BASIC METABOLIC PANEL
Anion gap: 9 (ref 5–15)
BUN: 16 mg/dL (ref 8–23)
CO2: 24 mmol/L (ref 22–32)
Calcium: 8.5 mg/dL — ABNORMAL LOW (ref 8.9–10.3)
Chloride: 103 mmol/L (ref 98–111)
Creatinine, Ser: 0.86 mg/dL (ref 0.44–1.00)
GFR, Estimated: >60 mL/min (ref >60)
Glucose, Bld: 173 mg/dL — ABNORMAL HIGH (ref 70–99)
Potassium: 4.2 mmol/L (ref 3.5–5.1)
Sodium: 136 mmol/L (ref 135–145)

## 2022-11-04 LAB — CBC WITH DIFFERENTIAL/PLATELET
Abs Immature Granulocytes: 0.06 10*3/uL (ref 0.00–0.07)
Basophils Absolute: 0 10*3/uL (ref 0.0–0.1)
Basophils Relative: 0 %
Eosinophils Absolute: 0 10*3/uL (ref 0.0–0.5)
Eosinophils Relative: 0 %
HCT: 36.4 % (ref 36.0–46.0)
Hemoglobin: 11.3 g/dL — ABNORMAL LOW (ref 12.0–15.0)
Immature Granulocytes: 1 %
Lymphocytes Relative: 10 %
Lymphs Abs: 1.1 10*3/uL (ref 0.7–4.0)
MCH: 27.6 pg (ref 26.0–34.0)
MCHC: 31 g/dL (ref 30.0–36.0)
MCV: 88.8 fL (ref 80.0–100.0)
Monocytes Absolute: 0.4 10*3/uL (ref 0.1–1.0)
Monocytes Relative: 3 %
Neutro Abs: 9.5 10*3/uL — ABNORMAL HIGH (ref 1.7–7.7)
Neutrophils Relative %: 86 %
Platelets: 338 10*3/uL (ref 150–400)
RBC: 4.1 MIL/uL (ref 3.87–5.11)
RDW: 15.7 % — ABNORMAL HIGH (ref 11.5–15.5)
WBC: 11 10*3/uL — ABNORMAL HIGH (ref 4.0–10.5)
nRBC: 0 % (ref 0.0–0.2)

## 2022-11-04 LAB — METHYLMALONIC ACID, SERUM: Methylmalonic Acid, Quantitative: 421 nmol/L — ABNORMAL HIGH (ref 0–378)

## 2022-11-04 MED ORDER — ROSUVASTATIN CALCIUM 5 MG PO TABS
10.0000 mg | ORAL_TABLET | Freq: Every day | ORAL | Status: DC
  Administered 2022-11-04 – 2022-11-08 (×5): 10 mg via ORAL
  Filled 2022-11-04 (×5): qty 2

## 2022-11-04 MED ORDER — ORAL CARE MOUTH RINSE
15.0000 mL | OROMUCOSAL | Status: DC
  Administered 2022-11-05 – 2022-11-08 (×13): 15 mL via OROMUCOSAL

## 2022-11-04 MED ORDER — AMIODARONE LOAD VIA INFUSION
150.0000 mg | Freq: Once | INTRAVENOUS | Status: DC
  Filled 2022-11-04: qty 83.34

## 2022-11-04 MED ORDER — APIXABAN 5 MG PO TABS
5.0000 mg | ORAL_TABLET | Freq: Two times a day (BID) | ORAL | Status: DC
  Administered 2022-11-04 – 2022-11-08 (×9): 5 mg via ORAL
  Filled 2022-11-04 (×9): qty 1

## 2022-11-04 MED ORDER — AMIODARONE IV BOLUS ONLY 150 MG/100ML: 150.0000 mg | Freq: Once | INTRAVENOUS | Status: DC

## 2022-11-04 MED ORDER — FAMOTIDINE 20 MG PO TABS
20.0000 mg | ORAL_TABLET | Freq: Every day | ORAL | Status: DC
  Administered 2022-11-04 – 2022-11-08 (×5): 20 mg via ORAL
  Filled 2022-11-04 (×5): qty 1

## 2022-11-04 MED ORDER — ORAL CARE MOUTH RINSE: 15.0000 mL | OROMUCOSAL | Status: DC | PRN

## 2022-11-04 MED ORDER — CALCIUM CARBONATE ANTACID 500 MG PO CHEW
1.0000 | CHEWABLE_TABLET | Freq: Three times a day (TID) | ORAL | Status: DC | PRN
  Administered 2022-11-04: 200 mg via ORAL
  Filled 2022-11-04: qty 1

## 2022-11-04 NOTE — Progress Notes (Signed)
STROKE TEAM PROGRESS NOTE   SUBJECTIVE (INTERVAL HISTORY) Her son in law is at the bedside.  Overall her condition is rapidly improving. She still has left facial droop and left sensory deficit but LLE strength near normal and LUE strength at least 4/5, much improved from yesterday.    OBJECTIVE Temp:  [97.8 F (36.6 C)-98.6 F (37 C)] 97.9 F (36.6 C) (11/26 0800) Pulse Rate:  [61-153] 103 (11/26 1100) Cardiac Rhythm: Normal sinus rhythm (11/26 0900) Resp:  [8-25] 19 (11/26 1100) BP: (90-143)/(41-114) 92/80 (11/26 1100) SpO2:  [93 %-100 %] 98 % (11/26 1100) Weight:  [64.1 kg] 64.1 kg (11/26 0400)  Recent Labs  Lab 11/03/22 1357  GLUCAP 149*   Recent Labs  Lab 10/29/22 1552 10/30/22 0354 10/31/22 0108 11/01/22 0038 11/02/22 0030 11/03/22 0047 11/04/22 0422  NA 141   < > 139 137 142 140 136  K 4.0   < > 4.3 3.7 3.2* 4.3 4.2  CL 110   < > 109 101 101 101 103  CO2 20*   < > 17* '25 29 29 24  '$ GLUCOSE 123*   < > 134* 151* 109* 140* 173*  BUN 24*   < > 29* 32* 27* 23 16  CREATININE 1.03*   < > 1.07* 1.26* 1.25* 1.25* 0.86  CALCIUM 9.1   < > 9.0 8.8* 9.0 9.3 8.5*  MG 2.2  --  2.2 2.3 2.2 2.5*  --   PHOS  --   --  3.3 3.5 3.9 4.0  --    < > = values in this interval not displayed.   Recent Labs  Lab 10/31/22 0108 11/01/22 0038  AST 47* 36  ALT 64* 63*  ALKPHOS 89 84  BILITOT 0.4 0.4  PROT 6.6 6.7  ALBUMIN 3.1* 3.1*   Recent Labs  Lab 10/29/22 1552 10/30/22 0036 10/31/22 0108 11/01/22 0038 11/02/22 0030 11/03/22 0047 11/04/22 0422  WBC 13.3*   < > 14.4* 17.0* 13.9* 14.3* 11.0*  NEUTROABS 8.8*  --  11.4* 11.7*  --   --  9.5*  HGB 10.5*   < > 9.9* 11.3* 12.1 12.5 11.3*  HCT 33.9*   < > 32.9* 36.2 37.3 39.3 36.4  MCV 89.9   < > 91.4 88.7 87.4 88.3 88.8  PLT 333   < > 327 328 282 348 338   < > = values in this interval not displayed.   No results for input(s): "CKTOTAL", "CKMB", "CKMBINDEX", "TROPONINI" in the last 168 hours. No results for input(s):  "LABPROT", "INR" in the last 72 hours. No results for input(s): "COLORURINE", "LABSPEC", "PHURINE", "GLUCOSEU", "HGBUR", "BILIRUBINUR", "KETONESUR", "PROTEINUR", "UROBILINOGEN", "NITRITE", "LEUKOCYTESUR" in the last 72 hours.  Invalid input(s): "APPERANCEUR"     Component Value Date/Time   CHOL 151 10/31/2022 0108   TRIG 61 10/31/2022 0108   HDL 53 10/31/2022 0108   CHOLHDL 2.8 10/31/2022 0108   VLDL 12 10/31/2022 0108   LDLCALC 86 10/31/2022 0108   Lab Results  Component Value Date   HGBA1C 6.2 (H) 10/30/2022   No results found for: "LABOPIA", "COCAINSCRNUR", "LABBENZ", "AMPHETMU", "THCU", "LABBARB"  No results for input(s): "ETH" in the last 168 hours.  I have personally reviewed the radiological images below and agree with the radiology interpretations.  MR BRAIN WO CONTRAST  Result Date: 11/03/2022 CLINICAL DATA:  Stroke follow-up.  Right M1 occlusion. EXAM: MRI HEAD WITHOUT CONTRAST TECHNIQUE: Multiplanar, multiecho pulse sequences of the brain and surrounding structures were obtained without intravenous contrast.  COMPARISON:  Same-day CT head FINDINGS: Incomplete study due to patient tolerance. Motion degraded axial FLAIR, axial SWI, and axial and coronal DWI sequences were obtained. 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 There is no associated hemorrhage or mass effect. There is no acute intracranial hemorrhage, extra-axial fluid collection, or acute infarct. There is unchanged background parenchymal volume loss with prominence of the ventricular system and extra-axial CSF spaces. The ventricles are similar in size to 2017. There is extensive confluent FLAIR signal abnormality in the supratentorial white matter consistent with advanced underlying chronic small-vessel ischemic change. 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 There is no mass lesion.  There is no mass effect or midline shift. Vascular: Not well assessed on the current study. Skull and upper cervical spine: Grossly unremarkable on the provided sequences. Sinuses/Orbits: The paranasal sinuses are clear. The globes and orbits are grossly unremarkable. Other: None. IMPRESSION: 1. Incomplete, motion degraded study. 2. Small areas of acute infarct in the right frontal and temporal lobe cortex and caudate body without hemorrhage or mass effect. 3. 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 Electronically Signed   By: Valetta Mole [name]   On: 11/03/2022 19:16   CT HEAD CODE STROKE WO CONTRAST  Result Date: 11/03/2022 CLINICAL DATA:  Left-sided deficit and facial droop. EXAM: CT ANGIOGRAPHY HEAD AND NECK TECHNIQUE: Multidetector CT imaging of the head and neck was performed using the standard protocol during bolus administration of intravenous contrast. Multiplanar CT image reconstructions and MIPs were obtained to evaluate the vascular anatomy. Carotid stenosis measurements (when applicable) are obtained utilizing NASCET criteria, using the distal internal carotid diameter as the denominator. RADIATION DOSE REDUCTION: This exam was performed according to the departmental dose-optimization program which includes automated exposure control, adjustment of the mA and/or kV according to patient size and/or use of iterative reconstruction technique. CONTRAST:  85m OMNIPAQUE IOHEXOL 350 MG/ML SOLN COMPARISON:  Brain MRI 12/30/2015 FINDINGS: CT HEAD FINDINGS Brain: There is no evidence of acute intracranial hemorrhage, extra-axial fluid collection, or acute territorial infarct. ASPECTS is 10. There is moderate background parenchymal volume loss with prominence of the ventricular system and  extra-axial CSF spaces. The ventricles are similar in size to 2017. Extensive confluent hypodensity throughout the supratentorial white matter likely reflects sequela of underlying chronic small-vessel ischemic change There is no mass lesion.  There is no mass effect or midline shift. Vascular: See below. Skull: Normal. Negative for fracture or focal lesion. Sinuses/Orbits: The paranasal sinuses are clear. Bilateral lens implants are in place. The globes and orbits are otherwise unremarkable. Other: None. Review of the MIP images confirms the above findings CTA NECK FINDINGS Aortic arch: The imaged aortic arch is normal. The origins of the left common carotid and subclavian arteries are patent. The brachiocephalic artery origin is not imaged. The subclavian arteries are patent to the level Right carotid system: Imaged. The right common, internal, and external carotid arteries are patent, without hemodynamically significant stenosis or occlusion. There is no dissection or aneurysm. Left carotid system: The left common, internal, and external carotid arteries are patent, without hemodynamically significant stenosis or occlusion. There is no dissection or aneurysm. Vertebral arteries: The vertebral arteries are patent, without  hemodynamically significant stenosis or occlusion. There is no dissection or aneurysm. Skeleton: There is no acute osseous abnormality or suspicious osseous lesion. There is no visible canal hematoma. Other neck: The soft tissues of the neck are unremarkable. Upper chest: The imaged lung apices are clear. Review of the MIP images confirms the above findings CTA HEAD FINDINGS Anterior circulation: The intracranial ICAs are patent. There is a right M1 occlusion. An inferior right M2 branch reconstitutes in the sylvian fissure common but there is overall poor collateral flow in the MCA distribution. The left MCA branches are patent, without proximal stenosis or occlusion. The bilateral ACAs are  patent, without proximal stenosis or occlusion. The anterior communicating artery is normal. There is no aneurysm or AVM. Posterior circulation: The bilateral V4 segments are patent. The basilar artery is patent. The major cerebellar arteries appear patent. The bilateral PCAs are patent, without proximal stenosis or occlusion. Posterior communicating arteries are not definitely seen. There is no aneurysm or AVM. Venous sinuses: As permitted by contrast timing, patent. Anatomic variants: None. Review of the MIP images confirms the above findings IMPRESSION: 1. No acute intracranial pathology on initial noncontrast head CT. ASPECTS is 10. 2. 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 3. Otherwise patent vasculature of the head and neck without other hemodynamically significant stenosis or occlusion. Results of the initial noncontrast head CT were paged via Amion to Dr. Erlinda Hong at 2:35 pm. CTA results were discussed at 2:38 pm. Electronically Signed   By: Valetta Mole [name]   On: 11/03/2022 14:44   CT ANGIO HEAD NECK W WO CM (CODE STROKE)  Result Date: 11/03/2022 CLINICAL DATA:  Left-sided deficit and facial droop. EXAM: CT ANGIOGRAPHY HEAD AND NECK TECHNIQUE: Multidetector CT imaging of the head and neck was performed using the standard protocol during bolus administration of intravenous contrast. Multiplanar CT image reconstructions and MIPs were obtained to evaluate the vascular anatomy. Carotid stenosis measurements (when applicable) are obtained utilizing NASCET criteria, using the distal internal carotid diameter as the denominator. RADIATION DOSE REDUCTION: This exam was performed according to the departmental dose-optimization program which includes automated exposure control, adjustment of the mA and/or kV according to patient size and/or use of iterative reconstruction technique. CONTRAST:  14m OMNIPAQUE IOHEXOL 350 MG/ML SOLN COMPARISON:  Brain MRI 12/30/2015 FINDINGS: CT HEAD  FINDINGS Brain: There is no evidence of acute intracranial hemorrhage, extra-axial fluid collection, or acute territorial infarct. ASPECTS is 10. There is moderate background parenchymal volume loss with prominence of the ventricular system and extra-axial CSF spaces. The ventricles are similar in size to 2017. Extensive confluent hypodensity throughout the supratentorial white matter likely reflects sequela of underlying chronic small-vessel ischemic change There is no mass lesion.  There is no mass effect or midline shift. Vascular: See below. Skull: Normal. Negative for fracture or focal lesion. Sinuses/Orbits: The paranasal sinuses are clear. Bilateral lens implants are in place. The globes and orbits are otherwise unremarkable. Other: None. Review of the MIP images confirms the above findings CTA NECK FINDINGS Aortic arch: The imaged aortic arch is normal. The origins of the left common carotid and subclavian arteries are patent. The brachiocephalic artery origin is not imaged. The subclavian arteries are patent to the level Right carotid system: Imaged. The right common, internal, and external carotid arteries are patent, without hemodynamically significant stenosis or occlusion. There is no dissection or aneurysm. Left carotid system: The left common, internal, and external carotid arteries are patent, without hemodynamically significant stenosis  or occlusion. There is no dissection or aneurysm. Vertebral arteries: The vertebral arteries are patent, without hemodynamically significant stenosis or occlusion. There is no dissection or aneurysm. Skeleton: There is no acute osseous abnormality or suspicious osseous lesion. There is no visible canal hematoma. Other neck: The soft tissues of the neck are unremarkable. Upper chest: The imaged lung apices are clear. Review of the MIP images confirms the above findings CTA HEAD FINDINGS Anterior circulation: The intracranial ICAs are patent. There is a right M1  occlusion. An inferior right M2 branch reconstitutes in the sylvian fissure common but there is overall poor collateral flow in the MCA distribution. The left MCA branches are patent, without proximal stenosis or occlusion. The bilateral ACAs are patent, without proximal stenosis or occlusion. The anterior communicating artery is normal. There is no aneurysm or AVM. Posterior circulation: The bilateral V4 segments are patent. The basilar artery is patent. The major cerebellar arteries appear patent. The bilateral PCAs are patent, without proximal stenosis or occlusion. Posterior communicating arteries are not definitely seen. There is no aneurysm or AVM. Venous sinuses: As permitted by contrast timing, patent. Anatomic variants: None. Review of the MIP images confirms the above findings IMPRESSION: 1. No acute intracranial pathology on initial noncontrast head CT. ASPECTS is 10. 2. 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 3. Otherwise patent vasculature of the head and neck without other hemodynamically significant stenosis or occlusion. Results of the initial noncontrast head CT were paged via Amion to Dr. Erlinda Hong at 2:35 pm. CTA results were discussed at 2:38 pm. Electronically Signed   By: Valetta Mole [name]   On: 11/03/2022 14:44   DG CHEST PORT 1 VIEW  Result Date: 10/31/2022 CLINICAL DATA:  Shortness of breath EXAM: PORTABLE CHEST 1 VIEW COMPARISON:  Chest x-ray dated October 31, 2022 FINDINGS: Cardiac and mediastinal contours are unchanged. Increased bilateral interstitial opacities. Similar right basilar opacity. Probable new small bilateral pleural effusions. No evidence of pneumothorax IMPRESSION: 1. Slightly increased bilateral interstitial opacities, likely due to worsened pulmonary edema. 2. Similar right basilar opacity which may be due to infection or aspiration. 3. Probable new small bilateral pleural effusions. Electronically Signed   By: Yetta Glassman [name]   On:  10/31/2022 15:57   DG CHEST PORT 1 VIEW  Result Date: 10/31/2022 CLINICAL DATA:  Post thoracentesis yesterday, shortness of breath, atrial fibrillation EXAM: PORTABLE CHEST 1 VIEW COMPARISON:  Portable exam 0606 hours compared to 10/30/2022 FINDINGS: Upper normal heart size. Mediastinal contours and pulmonary vascularity normal. Bibasilar atelectasis. Patchy opacity RIGHT base question infiltrate, little changed. Upper lungs clear. No pleural effusion or pneumothorax. 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 IMPRESSION: Bibasilar atelectasis and probable RIGHT basilar infiltrate. Electronically Signed   By: Lavonia Dana [name]   On: 10/31/2022 08:30   DG CHEST PORT 1 VIEW  Result Date: 10/30/2022 CLINICAL DATA:  Congestive heart failure status post thoracentesis EXAM: PORTABLE CHEST 1 VIEW COMPARISON:  Chest radiograph dated 10/29/2022, CTA chest dated 10/29/2022 FINDINGS: Normal lung volumes. Persistent confluence right lower lung opacity. Patchy left basilar opacity. No definite pleural effusion questionable trace right apical pneumothorax. The heart size and mediastinal contours are within normal limits. The visualized skeletal structures are unremarkable. IMPRESSION: 1. Persistent confluence right lower lung opacity, suspicious for pneumonia. 2. Patchy left basilar opacity, likely atelectasis. 3. Questionable trace right apical pneumothorax. No definite pleural effusion. Recommend attention on follow-up. Electronically Signed   By: Shawn Route.D.  On: 10/30/2022 15:58   ECHOCARDIOGRAM COMPLETE  Result Date: 10/30/2022    ECHOCARDIOGRAM REPORT   Patient Name:   [name] Date of Exam: 10/30/2022 Medical Rec #:  [id number]           Height:       67.5 in Accession #:    [id number]          Weight:       171.5 lb Date of Birth:  22-Apr-1941           BSA:          1.905 m Patient Age:    80 years            BP:           119/92 mmHg Patient Gender: F                    HR:           103 bpm. Exam Location:  Inpatient Procedure: 2D Echo, Color Doppler and Cardiac Doppler Indications:    I48.91* Unspecified atrial fibrillation  History:        Patient has prior history of Echocardiogram examinations, most                 recent 01/10/2016. Arrythmias:Atrial Fibrillation.  Sonographer:    Raquel Sarna Senior RDCS Referring Phys: [phone] A [place]  1. Left ventricular ejection fraction, by estimation, is 40 to 45%. The left ventricle has mildly decreased function. The left ventricle demonstrates global hypokinesis. There is moderate asymmetric left ventricular hypertrophy of the basal-septal segment. Left ventricular diastolic parameters are indeterminate.  2. Right ventricular systolic function is normal. The right ventricular size is normal. There is normal pulmonary artery systolic pressure.  3. Left atrial size was mildly dilated.  4. The mitral valve is abnormal. 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 No evidence of mitral stenosis.  5. The aortic valve was not well visualized. Aortic valve regurgitation is trivial. No aortic stenosis is present.  6. The inferior vena cava is dilated in size with >50% respiratory variability, suggesting right atrial pressure of 8 mmHg. Comparison(s): MR is worse from prior report. FINDINGS  Left Ventricle: Left ventricular ejection fraction, by estimation, is 40 to 45%. The left ventricle has mildly decreased function. The left ventricle demonstrates global hypokinesis. The left ventricular internal cavity size was normal in size. There is  moderate asymmetric left ventricular hypertrophy of the basal-septal segment. Left ventricular diastolic parameters are indeterminate. Right Ventricle: The right ventricular size is normal. No increase in right ventricular wall thickness. Right ventricular systolic function is normal. There is normal pulmonary artery systolic pressure. The tricuspid regurgitant velocity is 2.72 m/s, and   with an assumed right atrial pressure of 3 mmHg, the estimated right ventricular systolic pressure is 00.9 mmHg. Left Atrium: Left atrial size was mildly dilated. Right Atrium: Right atrial size was normal in size. Pericardium: The pericardium was not well visualized. Mitral Valve: MR volume 52. The mitral valve is abnormal. Mild mitral annular calcification. 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 No evidence of mitral valve stenosis. Tricuspid Valve: The tricuspid valve is not well visualized. Tricuspid valve regurgitation is not demonstrated. No evidence of tricuspid stenosis. Aortic Valve: The aortic valve was not well visualized. Aortic valve regurgitation is trivial. No aortic stenosis is present. Pulmonic Valve: The pulmonic valve was not well visualized. Pulmonic valve regurgitation is not visualized. No evidence of pulmonic stenosis. Aorta:  The aortic root, ascending aorta and aortic arch are all structurally normal, with no evidence of dilitation or obstruction. Venous: The inferior vena cava is dilated in size with greater than 50% respiratory variability, suggesting right atrial pressure of 8 mmHg. IAS/Shunts: No atrial level shunt detected by color flow Doppler.  LEFT VENTRICLE PLAX 2D LVIDd:         4.20 cm LVIDs:         3.50 cm LV PW:         0.90 cm LV IVS:        1.25 cm LVOT diam:     2.10 cm LV SV:         40 LV SV Index:   21 LVOT Area:     3.46 cm  RIGHT VENTRICLE RV S prime:     10.30 cm/s TAPSE (M-mode): 1.3 cm LEFT ATRIUM             Index        RIGHT ATRIUM           Index LA diam:        3.80 cm 1.99 cm/m   RA Area:     13.50 cm LA Vol (A2C):   65.3 ml 34.28 ml/m  RA Volume:   31.30 ml  16.43 ml/m LA Vol (A4C):   76.4 ml 40.11 ml/m LA Biplane Vol: 75.7 ml 39.74 ml/m  AORTIC VALVE LVOT Vmax:   74.00 cm/s LVOT Vmean:  48.700 cm/s LVOT VTI:    0.116 m  AORTA Ao Root diam: 3.30 cm Ao Asc diam:  3.10 cm MR Peak grad:    84.6 mmHg    TRICUSPID VALVE MR Mean grad:    60.0 mmHg     TR Peak grad:   29.6 mmHg MR Vmax:         460.00 cm/s  TR Vmax:        272.00 cm/s MR Vmean:        370.5 cm/s MR PISA:         4.02 cm     SHUNTS MR PISA Eff ROA: 28 mm       Systemic VTI:  0.12 m MR PISA Radius:  0.80 cm      Systemic Diam: 2.10 cm Rudean Haskell MD Electronically signed by Rudean Haskell MD Signature Date/Time: 10/30/2022/10:54:26 AM    Final    CT Angio Chest PE W and/or Wo Contrast  Result Date: 10/29/2022 CLINICAL DATA:  Pulmonary embolism (PE) suspected, low to intermediate prob, positive D-dimer EXAM: CT ANGIOGRAPHY CHEST WITH CONTRAST TECHNIQUE: Multidetector CT imaging of the chest was performed using the standard protocol during bolus administration of intravenous contrast. Multiplanar CT image reconstructions and MIPs were obtained to evaluate the vascular anatomy. RADIATION DOSE REDUCTION: This exam was performed according to the departmental dose-optimization program which includes automated exposure control, adjustment of the mA and/or kV according to patient size and/or use of iterative reconstruction technique. CONTRAST:  71m OMNIPAQUE IOHEXOL 350 MG/ML SOLN COMPARISON:  None Available. FINDINGS: Cardiovascular: Heart is normal size. Aorta is normal caliber. Scattered coronary artery and aortic calcifications. No filling defects in the pulmonary arteries to suggest pulmonary emboli. Mediastinum/Nodes: Mildly prominent mediastinal and bilateral hilar lymph nodes. No axillary adenopathy. Trachea and esophagus are unremarkable. Lungs/Pleura: Moderate right pleural effusion. Airspace disease in the right lower lobe compatible with pneumonia. Clustered tree-in-bud nodular densities in the right upper lobe also likely infectious. No confluent opacity on the left. Upper  Abdomen: No acute findings Musculoskeletal: Chest wall soft tissues are unremarkable. No acute bony abnormality. Review of the MIP images confirms the above findings. IMPRESSION: Right lower lobe  consolidation with moderate right pleural effusion. Tree-in-bud nodular densities in the right upper lobe. Findings compatible with pneumonia. No evidence of pulmonary embolus. Coronary artery disease. Aortic Atherosclerosis (ICD10-I70.0). Electronically Signed   By: Rolm Baptise [name]   On: 10/29/2022 23:43   DG Chest Portable 1 View  Result Date: 10/29/2022 CLINICAL DATA:  Atrial fibrillation, palpitations EXAM: PORTABLE CHEST 1 VIEW COMPARISON:  10/23/2022 FINDINGS: Transverse diameter of heart is slightly increased. There are no signs of alveolar pulmonary edema. There is interval decrease in patchy infiltrates in right parahilar region and right lower lung field. New small patchy infiltrate is seen in the lateral aspect of left lower lung field. There is no pleural effusion or pneumothorax. IMPRESSION: There is interval decrease in patchy infiltrates in right mid and right lower lung fields suggesting resolving atelectasis/pneumonia. There is new small patchy infiltrate in left lower lung fields suggesting multifocal pneumonia. Electronically Signed   By: Elmer Picker [name]   On: 10/29/2022 16:37   DG Chest 2 View  Result Date: 10/25/2022 CLINICAL DATA:  Cough and shortness of breath. Recent diagnosis of pneumonia. EXAM: CHEST - 2 VIEW COMPARISON:  PA and lateral chest 10/05/2022. FINDINGS: Patchy airspace disease in the right chest, worst lung base has progressed since the prior study. The left lung remains clear. Heart size is normal. No pneumothorax or pleural fluid. No acute bony abnormality. IMPRESSION: Worsened airspace disease in the right chest most consistent with progressive pneumonia. Electronically Signed   By: Inge Rise [name]   On: 10/25/2022 07:50   DG Chest 2 View  Result Date: 10/09/2022 CLINICAL DATA:  81 year old female with acute bronchitis EXAM: CHEST - 2 VIEW COMPARISON:  06/11/2005 FINDINGS: Cardiomediastinal silhouette unchanged in size and contour. No evidence  of central vascular congestion. No interlobular septal thickening. Reticulonodular opacities at the right lung base new from the prior. No pneumothorax or pleural effusion. No acute displaced fracture. Degenerative changes of the spine. Surgical changes the lumbar region incompletely imaged IMPRESSION: Right-sided pneumonia. Followup PA and lateral chest X-ray is recommended in 3-4 weeks following therapy to assure resolution Electronically Signed   By: Corrie Mckusick D.O.   On: 10/09/2022 08:58     PHYSICAL EXAM  Temp:  [97.8 F (36.6 C)-98.6 F (37 C)] 97.9 F (36.6 C) (11/26 0800) Pulse Rate:  [61-153] 103 (11/26 1100) Resp:  [8-25] 19 (11/26 1100) BP: (90-143)/(41-114) 92/80 (11/26 1100) SpO2:  [93 %-100 %] 98 % (11/26 1100) Weight:  [64.1 kg] 64.1 kg (11/26 0400)  General - Well nourished, well developed, in no apparent distress.  Ophthalmologic - fundi not visualized due to noncooperation.  Cardiovascular - irregularly irregular heart rate and rhythm.  Neuro - awake, alert, eyes open, orientated to age, place, time and people. No aphasia, fluent language, following all simple commands, moderate dysarthria. Able to name and repeat. 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 Left facial droop, left palpebral fissure wider and left eye closure weaker than right. Tongue midline. RUE and RLE 5/5. LLE 5-/5, LUE 4+/5 proximal and bicep, 4/5 tricep and finger grip. Sensation decreased on the left face and UE and diminished on the LLE, b/l FTN intact, gait not tested.     ASSESSMENT/PLAN Ms. Chrystie Hagwood is a 81 y.o. female with history of recently  diagnosed afib RVR admitted for afib management. Put on eliquis but developed acute onset left facial droop, left sided weakness, left neglect and right gaze preference with moderate dysarthria. No tPA given due to on eliquis.    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 MRI  Small areas of acute infarct in the right frontal and temporal lobe cortex and caudate body without hemorrhage or mass effect. S/p IR with TICI3 reperfusion 2D Echo  EF 40-45% LDL 86 HgbA1c 6.2 eliquis for VTE prophylaxis Eliquis (apixaban) daily prior to admission, now on Eliquis (apixaban) daily given small size of infarcts Patient counseled to be compliant with her antithrombotic medications Ongoing aggressive stroke risk factor management Therapy recommendations:  pending Disposition:  pending  Afib RVR Recent diagnosis of Afib  Rate still not fully controlled Cardiology on board On amiodarone and cardizem Resume eliquis today  BP management Stable BP goal 120-140 first 24h post IR Long term BP goal normotensive  Hyperlipidemia Home meds:  none  LDL 86, goal < 70 Now on crestor 10 No high intensity statin given LDL near goal and advanced age Continue statin at discharge  Other Stroke Risk Factors Advanced age  Other Active Problems Leukocytosis WBC 14.4->17.0->14.3->11.0  Hospital day # 5  This patient is critically ill due to afib RVR, acute stroke needing thrombectomy and at significant risk of neurological worsening, death form recurrent stroke, hemorrhagic conversion, heart failure. This patient's care requires constant monitoring of vital signs, hemodynamics, respiratory and cardiac monitoring, review of multiple databases, neurological assessment, discussion with family, other specialists and medical decision making of high complexity. I spent 40 minutes of neurocritical care time in the care of this patient. I had long discussion with pt and son in law at bedside, updated pt current condition, treatment plan and potential prognosis, and answered all the questions. They expressed  understanding and appreciation.    Rosalin Hawking, MD PhD Stroke Neurology 11/04/2022 11:40 AM    To contact Stroke Continuity provider, please refer to [email or web address] After hours, contact General Neurology

## 2022-11-04 NOTE — Evaluation (Signed)
Occupational Therapy Evaluation Patient Details Name: Leslie [name] MRN: [id number] DOB: [birth year] Today's Date: 11/04/2022   History of Present Illness Pt is an 81 y.o. female admitted 10/29/22 after being directed by PCP to ED for afib with RVR; workup also revealed CAP. 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 PMH includes CKD, stroke, arthritis, insomnia, plantar fasciitis, back sx, L THA (2022).   Clinical Impression   Leslie [name] was evaluated s/p the above admission list. She lives alone and is indep at baseline. Upon evaluation pt had functional limitation due to new L hemibody sensory motor deficits, impaired cognition, L inattention, and decreased activity tolerance. Overall she require min A for all transfers, mobility and ADLs. Pt benefited from cues throughout for safety and attention. OT to continue to follow acutely. Recommend d/c to AIR for maximal functional progress towards indep baseline.      Recommendations for follow up therapy are one component of a multi-disciplinary discharge planning process, led by the attending physician.  Recommendations may be updated based on patient status, additional functional criteria and insurance authorization.   Follow Up Recommendations  Acute inpatient rehab (3hours/day)     Assistance Recommended at Discharge Frequent or constant Supervision/Assistance  Patient can return home with the following A little help with walking and/or transfers;A little help with bathing/dressing/bathroom;Assistance with cooking/housework;Assistance with feeding;Direct supervision/assist for medications management;Direct supervision/assist for financial management;Assist for transportation;Help with stairs or ramp for entrance    Functional Status Assessment  Patient has had a recent decline in their functional status and demonstrates the ability to make significant improvements in function in a  reasonable and predictable amount of time.  Equipment Recommendations  BSC/3in1;Other (comment) (rollator)    Recommendations for Other Services Rehab consult     Precautions / Restrictions Precautions Precautions: Fall Restrictions Weight Bearing Restrictions: No      Mobility Bed Mobility Overal bed mobility: Needs Assistance Bed Mobility: Supine to Sit     Supine to sit: Min assist          Transfers Overall transfer level: Needs assistance Equipment used: Rolling walker (2 wheels), Rollator (4 wheels) Transfers: Sit to/from Stand Sit to Stand: Min assist, +2 safety/equipment           General transfer comment: min A +2 for safety initally, progressed to +1.      Balance Overall balance assessment: Needs assistance Sitting-balance support: Feet supported Sitting balance-Leahy Scale: Fair     Standing balance support: Bilateral upper extremity supported, During functional activity Standing balance-Leahy Scale: Poor                             ADL either performed or assessed with clinical judgement   ADL Overall ADL's : Needs assistance/impaired Eating/Feeding: NPO   Grooming: Set up;Sitting   Upper Body Bathing: Minimal assistance;Sitting   Lower Body Bathing: Minimal assistance;Sit to/from stand   Upper Body Dressing : Minimal assistance;Sitting   Lower Body Dressing: Minimal assistance;Sit to/from stand   Toilet Transfer: Minimal assistance;Ambulation;Rolling walker (2 wheels);BSC/3in1   Toileting- Clothing Manipulation and Hygiene: Minimal assistance;Sitting/lateral lean       Functional mobility during ADLs: Minimal assistance;Rollator (4 wheels);Rolling walker (2 wheels) General ADL Comments: limited by L hemiparesis     Vision Baseline Vision/History: 1 Wears glasses Vision Assessment?: Vision impaired- to be further tested in functional context Additional Comments: L inattention, will benefit from  further visual  assessments     Perception Perception Perception Tested?: No   Praxis Praxis Praxis tested?: Not tested    Pertinent Vitals/Pain Pain Assessment Pain Assessment: Faces Faces Pain Scale: No hurt     Hand Dominance     Extremity/Trunk Assessment Upper Extremity Assessment Upper Extremity Assessment: LUE deficits/detail LUE Deficits / Details: 3+/5, full ROM, impiared sensory motor coordination. Paraesthesias throughout extremeity LUE Sensation: decreased light touch;decreased proprioception LUE Coordination: decreased gross motor;decreased fine motor   Lower Extremity Assessment Lower Extremity Assessment: Defer to PT evaluation   Cervical / Trunk Assessment Cervical / Trunk Assessment: Kyphotic   Communication Communication Communication: No difficulties   Cognition Arousal/Alertness: Awake/alert Behavior During Therapy: WFL for tasks assessed/performed Overall Cognitive Status: Impaired/Different from baseline Area of Impairment: Attention, Awareness, Problem solving                   Current Attention Level: Selective       Awareness: Emergent Problem Solving: Slow processing, Requires verbal cues General Comments: L inattention, difficulty with dual tasking. Verbose at times, easily re-directed. Motivated!     General Comments  VSS on RA, son in law present    Exercises     Shoulder Instructions      Home Living Family/patient expects to be discharged to:: Private residence Living Arrangements: Alone Available Help at Discharge: Neighbor;Available PRN/intermittently Type of Home: House Home Access: Stairs to enter [employer] of Steps: 2 Entrance Stairs-Rails: Right;Left Home Layout: Two level;Able to live on main level with bedroom/bathroom Alternate Level Stairs-Number of Steps: 4 steps down to main living room w/ rail   Bathroom Shower/Tub: Occupational psychologist: Standard Bathroom Accessibility: Yes   Home  Equipment: Conservation officer, nature (2 wheels);Cane - single point;Shower seat   Additional Comments: "I can call my neighbor if I just have to have help with something." Daughter lives in [place]      Prior Functioning/Environment Prior Level of Function : Independent/Modified Independent;Driving             Mobility Comments: Mod indep with intermittent use of SPC ADLs Comments: stands in walk-in shower        OT Problem List: Decreased strength;Decreased range of motion;Decreased activity tolerance;Impaired balance (sitting and/or standing);Decreased safety awareness;Decreased knowledge of use of DME or AE;Decreased knowledge of precautions;Decreased coordination;Impaired UE functional use;Impaired sensation      OT Treatment/Interventions: Self-care/ADL training;Therapeutic exercise;Neuromuscular education;DME and/or AE instruction;Therapeutic activities;Patient/family education;Balance training    OT Goals(Current goals can be found in the care plan section) Acute Rehab OT Goals Patient Stated Goal: to get stronger OT Goal Formulation: With patient Time For Goal Achievement: 11/18/22 Potential to Achieve Goals: Good ADL Goals Pt Will Perform Grooming: with modified independence;standing Pt Will Perform Upper Body Dressing: with modified independence;sitting Pt Will Perform Lower Body Dressing: with modified independence;sit to/from stand Pt Will Transfer to Toilet: with modified independence;ambulating Additional ADL Goal #1: pt will indep complete IADL medication management task  OT Frequency: Min 2X/week    Co-evaluation PT/OT/SLP Co-Evaluation/Treatment: Yes Reason for Co-Treatment: Complexity of the patient's impairments (multi-system involvement);For patient/therapist safety;To address functional/ADL transfers   OT goals addressed during session: ADL's and self-care      AM-PAC OT "6 Clicks" Daily Activity     Outcome Measure Help from another person eating meals?:  Total Help from another person taking care of personal grooming?: A Little Help from another person toileting, which includes using toliet, bedpan, or urinal?: A Little Help  from another person bathing (including washing, rinsing, drying)?: A Little Help from another person to put on and taking off regular upper body clothing?: A Little Help from another person to put on and taking off regular lower body clothing?: A Little 6 Click Score: 16   End of Session Equipment Utilized During Treatment: Gait belt;Rolling walker (2 wheels);Rollator (4 wheels) Nurse Communication: Mobility status  Activity Tolerance: Patient tolerated treatment well Patient left: in chair;with call bell/phone within reach;with chair alarm set;with family/visitor present  OT Visit Diagnosis: Unsteadiness on feet (R26.81);Other abnormalities of gait and mobility (R26.89);Muscle weakness (generalized) (M62.81);Hemiplegia and hemiparesis Hemiplegia - Right/Left: Left Hemiplegia - dominant/non-dominant: Non-Dominant Hemiplegia - caused by: Cerebral infarction                Time: 1400-1430 OT Time Calculation (min): 30 min Charges:  OT General Charges $OT Visit: 1 Visit OT Evaluation $OT Eval Moderate Complexity: 1 Mod    Yumna Ebers D Causey 11/04/2022, 4:00 PM

## 2022-11-04 NOTE — Progress Notes (Signed)
[place] for Eliquis Indication: atrial fibrillation  Allergies  Allergen Reactions   Duloxetine Nausea Only   Voltaren [Diclofenac Sodium] Shortness Of Breath and Palpitations   Lisinopril Other (See Comments)   Aspirin Other (See Comments)    ulcers   Diphenhydramine Other (See Comments)   Gabapentin Itching   Ultram [Tramadol Hcl] Other (See Comments)    Effects cognitive abilities    Proton Pump Inhibitors Rash    Itching, rash    Patient Measurements: Height: 5' 7.5" (171.5 cm) Weight: 64.1 kg (141 lb 5 oz) IBW/kg (Calculated) : 62.75  Vital Signs: Temp: 97.9 F (36.6 C) (11/26 1159) Temp Source: Oral (11/26 1159) BP: 92/80 (11/26 1100) Pulse Rate: 103 (11/26 1100)  Labs: Recent Labs    11/02/22 0030 11/03/22 0047 11/04/22 0422  HGB 12.1 12.5 11.3*  HCT 37.3 39.3 36.4  PLT 282 348 338  CREATININE 1.25* 1.25* 0.86    Estimated Creatinine Clearance: 51.7 mL/min (by C-G formula based on SCr of 0.86 mg/dL).  Assessment:  Patient was admitted with Afib RVR. She was started on Eliquis inpatient and was not taking anticoagulation. Patient had an acute likely cardioembolic stroke requiring IR thrombecotmy of the right M1. Patient has been converted to aflutter. Neurology has cleared the patient to resume anticoagulation; pharmacy is consulted to dose Eliquis.   Patient does not meet the requirements for dose reduction, having only 1 of 3 criteria. Will need to monitor her weight as she is nearing 60 kg. CBC is stable and no bleeding is noted. Discussed with MD and stroke was not deemed an Eliquis failure.    Plan:  Start Eliquis 5 mg BID Monitor weight and renal function for dose adjustment  Thank you for allowing pharmacy to participate in this patient's care.  Reatha Harps, PharmD PGY2 Pharmacy Resident 11/04/2022 12:18 PM Check [email or web address] for unit specific pharmacy number

## 2022-11-04 NOTE — Progress Notes (Signed)
Referring Physician(s): Rosalin Hawking  Supervising Physician: Luanne Bras  Patient Status:  [facility] - In-pt  Chief Complaint:  Code stroke  Brief History:  Leslie [name] is an 81 y.o. female who was admitted for community-acquired pneumonia and atrial fibrillation with RVR.    She was recently started on Eliquis. Pneumonia was improving with antibiotics.    Yesterday she developed sudden onset right gaze deviation, left-sided hemiparesis and left-sided neglect.    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    She was taken to IR for mechanical thrombectomy.  She underwent bilateral common carotid arteriograms via a right CFA approach.   Findings. 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 Status post complete revascularization of occluded right middle cerebral artery M1 segment with one pass  with contact aspiration, and 4 mm x 40 mm Solitaire X retrieval device achieving a TICI 3 revascularization.   Subjective:  Sitting up in bed eating. Doing better today.  Allergies: Duloxetine, Voltaren [diclofenac sodium], Lisinopril, Aspirin, Diphenhydramine, Gabapentin, Ultram [tramadol hcl], and Proton pump inhibitors  Medications: Prior to Admission medications   Medication Sig Start Date End Date Taking? Authorizing Provider  acetaminophen (TYLENOL) 325 MG tablet Take 1-2 tablets (325-650 mg total) by mouth every 6 (six) hours as needed for mild pain (pain score 1-3 or temp > 100.5). Patient taking differently: Take 500 mg by mouth every 6 (six) hours as needed for mild pain (pain score 1-3 or temp > 100.5). 10/06/21  Yes Dorothyann Peng, PA-C  Cholecalciferol (VITAMIN D) 50 MCG (2000 UT) tablet Take 2,000 Units by mouth daily.   Yes [provider]  COLLAGEN PO Take 12 g by mouth daily.   Yes [provider]  cyclobenzaprine (FLEXERIL) 10 MG tablet Take 10 mg by mouth 2 (two) times daily as needed for  muscle spasms.   Yes [provider]  famotidine (PEPCID) 20 MG tablet Take 20 mg by mouth daily as needed for indigestion or heartburn.   Yes [provider]  Humidifier/Vaporizer Supplies [facility] VAPORIZER SCENT) PADS Inhale 1 Dose into the lungs daily as needed (congestion).   Yes [provider]  levothyroxine (SYNTHROID) 88 MCG tablet Take 88 mcg by mouth daily before breakfast.   Yes [provider]  Polyethyl Glycol-Propyl Glycol (SYSTANE OP) Place 1 drop into both eyes daily as needed (dry eyes).   Yes [provider]  senna (SENOKOT) 8.6 MG TABS tablet Take 1 tablet (8.6 mg total) by mouth 2 (two) times daily. Patient taking differently: Take 1 tablet by mouth 2 (two) times daily as needed for mild constipation or moderate constipation. 10/06/21  Yes Dorothyann Peng, PA-C     Vital Signs: BP 92/80 (BP Location: Right Arm)   Pulse (!) 103   Temp 97.9 F (36.6 C) (Oral)   Resp 19   Ht 5' 7.5" (1.715 m)   Wt 141 lb 5 oz (64.1 kg)   SpO2 98%   BMI 21.81 kg/m   Physical Exam Alert, awake. Mild dysarthria EOMs intact  Left facial droop Tongue deviates to the left. 4/5 Strength left grip, normal on the right. 5/5 Strength with dorsi/plantar flexion. Common femoral artery puncture site looks good, no bleeding, no hematoma, no pseudoaneurysm   Imaging: MR BRAIN WO CONTRAST  Result Date: 11/03/2022 CLINICAL DATA:  Stroke follow-up.  Right M1 occlusion. EXAM: MRI HEAD WITHOUT CONTRAST TECHNIQUE: Multiplanar, multiecho pulse sequences of the brain  and surrounding structures were obtained without intravenous contrast. COMPARISON:  Same-day CT head FINDINGS: Incomplete study due to patient tolerance. Motion degraded axial FLAIR, axial SWI, and axial and coronal DWI sequences were obtained. 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 There is no associated hemorrhage or mass effect. There is no acute intracranial hemorrhage, extra-axial fluid collection, or acute infarct. There is unchanged background parenchymal volume loss with prominence of the ventricular system and extra-axial CSF spaces. The ventricles are similar in size to 2017. There is extensive confluent FLAIR signal abnormality in the supratentorial white matter consistent with advanced underlying chronic small-vessel ischemic change. 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 There is no mass lesion.  There is no mass effect or midline shift. Vascular: Not well assessed on the current study. Skull and upper cervical spine: Grossly unremarkable on the provided sequences. Sinuses/Orbits: The paranasal sinuses are clear. The globes and orbits are grossly unremarkable. Other: None. IMPRESSION: 1. Incomplete, motion degraded study. 2. Small areas of acute infarct in the right frontal and temporal lobe cortex and caudate body without hemorrhage or mass effect. 3. 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 Electronically Signed   By: Valetta Mole [name]   On: 11/03/2022 19:16   CT HEAD CODE STROKE WO CONTRAST  Result Date: 11/03/2022 CLINICAL DATA:  Left-sided deficit and facial droop. EXAM: CT ANGIOGRAPHY HEAD AND NECK TECHNIQUE: Multidetector CT imaging of the head and neck was performed using the standard protocol during bolus administration of intravenous contrast. Multiplanar CT image reconstructions and MIPs were obtained to evaluate the vascular anatomy. Carotid stenosis measurements (when applicable) are obtained utilizing NASCET criteria, using the distal internal carotid diameter as the denominator. RADIATION DOSE REDUCTION: This exam was  performed according to the departmental dose-optimization program which includes automated exposure control, adjustment of the mA and/or kV according to patient size and/or use of iterative reconstruction technique. CONTRAST:  8m OMNIPAQUE IOHEXOL 350 MG/ML SOLN COMPARISON:  Brain MRI 12/30/2015 FINDINGS: CT HEAD FINDINGS Brain: There is no evidence of acute intracranial hemorrhage, extra-axial fluid collection, or acute territorial infarct. ASPECTS is 10. There is moderate background parenchymal volume loss with prominence of the ventricular system and extra-axial CSF spaces. The ventricles are similar in size to 2017. Extensive confluent hypodensity throughout the supratentorial white matter likely reflects sequela of underlying chronic small-vessel ischemic change There is no mass lesion.  There is no mass effect or midline shift. Vascular: See below. Skull: Normal. Negative for fracture or focal lesion. Sinuses/Orbits: The paranasal sinuses are clear. Bilateral lens implants are in place. The globes and orbits are otherwise unremarkable. Other: None. Review of the MIP images confirms the above findings CTA NECK FINDINGS Aortic arch: The imaged aortic arch is normal. The origins of the left common carotid and subclavian arteries are patent. The brachiocephalic artery origin is not imaged. The subclavian arteries are patent to the level Right carotid system: Imaged. The right common, internal, and external carotid arteries are patent, without hemodynamically significant stenosis or occlusion. There is no dissection or aneurysm. Left carotid system: The left common, internal, and external carotid arteries are patent, without hemodynamically significant stenosis or occlusion. There is no dissection or aneurysm.  Vertebral arteries: The vertebral arteries are patent, without hemodynamically significant stenosis or occlusion. There is no dissection or aneurysm. Skeleton: There is no acute osseous abnormality or  suspicious osseous lesion. There is no visible canal hematoma. Other neck: The soft tissues of the neck are unremarkable. Upper chest: The imaged lung apices are clear. Review of the MIP images confirms the above findings CTA HEAD FINDINGS Anterior circulation: The intracranial ICAs are patent. There is a right M1 occlusion. An inferior right M2 branch reconstitutes in the sylvian fissure common but there is overall poor collateral flow in the MCA distribution. The left MCA branches are patent, without proximal stenosis or occlusion. The bilateral ACAs are patent, without proximal stenosis or occlusion. The anterior communicating artery is normal. There is no aneurysm or AVM. Posterior circulation: The bilateral V4 segments are patent. The basilar artery is patent. The major cerebellar arteries appear patent. The bilateral PCAs are patent, without proximal stenosis or occlusion. Posterior communicating arteries are not definitely seen. There is no aneurysm or AVM. Venous sinuses: As permitted by contrast timing, patent. Anatomic variants: None. Review of the MIP images confirms the above findings IMPRESSION: 1. No acute intracranial pathology on initial noncontrast head CT. ASPECTS is 10. 2. 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 3. Otherwise patent vasculature of the head and neck without other hemodynamically significant stenosis or occlusion. Results of the initial noncontrast head CT were paged via Amion to Dr. Erlinda Hong at 2:35 pm. CTA results were discussed at 2:38 pm. Electronically Signed   By: Valetta Mole [name]   On: 11/03/2022 14:44   CT ANGIO HEAD NECK W WO CM (CODE STROKE)  Result Date: 11/03/2022 CLINICAL DATA:  Left-sided deficit and facial droop. EXAM: CT ANGIOGRAPHY HEAD AND NECK TECHNIQUE: Multidetector CT imaging of the head and neck was performed using the standard protocol during bolus administration of intravenous contrast. Multiplanar CT image reconstructions and MIPs  were obtained to evaluate the vascular anatomy. Carotid stenosis measurements (when applicable) are obtained utilizing NASCET criteria, using the distal internal carotid diameter as the denominator. RADIATION DOSE REDUCTION: This exam was performed according to the departmental dose-optimization program which includes automated exposure control, adjustment of the mA and/or kV according to patient size and/or use of iterative reconstruction technique. CONTRAST:  11m OMNIPAQUE IOHEXOL 350 MG/ML SOLN COMPARISON:  Brain MRI 12/30/2015 FINDINGS: CT HEAD FINDINGS Brain: There is no evidence of acute intracranial hemorrhage, extra-axial fluid collection, or acute territorial infarct. ASPECTS is 10. There is moderate background parenchymal volume loss with prominence of the ventricular system and extra-axial CSF spaces. The ventricles are similar in size to 2017. Extensive confluent hypodensity throughout the supratentorial white matter likely reflects sequela of underlying chronic small-vessel ischemic change There is no mass lesion.  There is no mass effect or midline shift. Vascular: See below. Skull: Normal. Negative for fracture or focal lesion. Sinuses/Orbits: The paranasal sinuses are clear. Bilateral lens implants are in place. The globes and orbits are otherwise unremarkable. Other: None. Review of the MIP images confirms the above findings CTA NECK FINDINGS Aortic arch: The imaged aortic arch is normal. The origins of the left common carotid and subclavian arteries are patent. The brachiocephalic artery origin is not imaged. The subclavian arteries are patent to the level Right carotid system: Imaged. The right common, internal, and external carotid arteries are patent, without hemodynamically significant stenosis or occlusion. There is no dissection or aneurysm. Left carotid system: The left common, internal, and external  carotid arteries are patent, without hemodynamically significant stenosis or occlusion.  There is no dissection or aneurysm. Vertebral arteries: The vertebral arteries are patent, without hemodynamically significant stenosis or occlusion. There is no dissection or aneurysm. Skeleton: There is no acute osseous abnormality or suspicious osseous lesion. There is no visible canal hematoma. Other neck: The soft tissues of the neck are unremarkable. Upper chest: The imaged lung apices are clear. Review of the MIP images confirms the above findings CTA HEAD FINDINGS Anterior circulation: The intracranial ICAs are patent. There is a right M1 occlusion. An inferior right M2 branch reconstitutes in the sylvian fissure common but there is overall poor collateral flow in the MCA distribution. The left MCA branches are patent, without proximal stenosis or occlusion. The bilateral ACAs are patent, without proximal stenosis or occlusion. The anterior communicating artery is normal. There is no aneurysm or AVM. Posterior circulation: The bilateral V4 segments are patent. The basilar artery is patent. The major cerebellar arteries appear patent. The bilateral PCAs are patent, without proximal stenosis or occlusion. Posterior communicating arteries are not definitely seen. There is no aneurysm or AVM. Venous sinuses: As permitted by contrast timing, patent. Anatomic variants: None. Review of the MIP images confirms the above findings IMPRESSION: 1. No acute intracranial pathology on initial noncontrast head CT. ASPECTS is 10. 2. 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 3. Otherwise patent vasculature of the head and neck without other hemodynamically significant stenosis or occlusion. Results of the initial noncontrast head CT were paged via Amion to Dr. Erlinda Hong at 2:35 pm. CTA results were discussed at 2:38 pm. Electronically Signed   By: Valetta Mole [name]   On: 11/03/2022 14:44   DG CHEST PORT 1 VIEW  Result Date: 10/31/2022 CLINICAL DATA:  Shortness of breath EXAM: PORTABLE CHEST 1 VIEW  COMPARISON:  Chest x-ray dated October 31, 2022 FINDINGS: Cardiac and mediastinal contours are unchanged. Increased bilateral interstitial opacities. Similar right basilar opacity. Probable new small bilateral pleural effusions. No evidence of pneumothorax IMPRESSION: 1. Slightly increased bilateral interstitial opacities, likely due to worsened pulmonary edema. 2. Similar right basilar opacity which may be due to infection or aspiration. 3. Probable new small bilateral pleural effusions. Electronically Signed   By: Yetta Glassman [name]   On: 10/31/2022 15:57    Labs:  CBC: Recent Labs    11/01/22 0038 11/02/22 0030 11/03/22 0047 11/04/22 0422  WBC 17.0* 13.9* 14.3* 11.0*  HGB 11.3* 12.1 12.5 11.3*  HCT 36.2 37.3 39.3 36.4  PLT 328 282 348 338    COAGS: No results for input(s): "INR", "APTT" in the last 8760 hours.  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  Status post complete revascularization of occluded right middle cerebral artery M1 segment with one pass  with contact aspiration, and 4 mm  x 40 mm Solitaire X retrieval device achieving a TICI 3 revascularization yesterday by Dr. Estanislado Pandy.  Care per neurology. Continue anticoagulation.   Electronically Signed: Murrell Redden, PA-C 11/04/2022, 11:50 AM    I spent a total of 25 Minutes at the the patient's bedside AND on the patient's hospital floor or unit, greater than 50% of which was counseling/coordinating care for  f/u stroke/cerebral intervention.

## 2022-11-04 NOTE — Progress Notes (Signed)
Rounding Note    Patient Name: Leslie [name] Date of Encounter: 11/04/2022  [place] Cardiologist: Peter Martinique, MD   Subjective   Yesterday developed right gaze deviation, left-sided hemiparesis and left-sided neglect.  Was taken to IR for thrombectomy with right M1 occlusion.  Was started on amiodarone.  Has since converted to atrial flutter.  Inpatient Medications    Scheduled Meds:  amiodarone  150 mg Intravenous Once   Chlorhexidine Gluconate Cloth  6 each Topical Daily   vitamin B-12  1,000 mcg Oral Daily   diltiazem  360 mg Oral Daily   furosemide  40 mg Oral Daily   levothyroxine  88 mcg Oral Q0600   Continuous Infusions:  sodium chloride Stopped (11/04/22 0957)   amiodarone 60 mg/hr (11/04/22 1000)   cefTRIAXone (ROCEPHIN)  IV 200 mL/hr at 11/04/22 1000   clevidipine Stopped (11/04/22 0325)   famotidine (PEPCID) IV Stopped (11/03/22 2040)   PRN Meds: guaiFENesin, ipratropium-albuterol, melatonin, metoprolol tartrate, ondansetron, mouth rinse   Vital Signs    Vitals:   11/04/22 0700 11/04/22 0800 11/04/22 0900 11/04/22 1000  BP: 90/63 105/65 114/85 113/86  Pulse: (!) 115 (!) 114 (!) 118 (!) 118  Resp: '12 18 19 '$ (!) 21  Temp:  97.9 F (36.6 C)    TempSrc:  Oral    SpO2: 98% 97% 96% 100%  Weight:      Height:        Intake/Output Summary (Last 24 hours) at 11/04/2022 1034 Last data filed at 11/04/2022 1000 Gross per 24 hour  Intake 3102.71 ml  Output 1470 ml  Net 1632.71 ml       11/04/2022    4:00 AM 11/03/2022    9:00 PM 11/03/2022    5:04 AM  Last 3 Weights  Weight (lbs) 141 lb 5 oz 141 lb 5 oz 164 lb 10.9 oz  Weight (kg) 64.1 kg 64.1 kg 74.7 kg      Telemetry    Atrial flutter, rate 120-personally reviewed  ECG   None new  Physical Exam   GEN: Well nourished, well developed, in no acute distress  HEENT: normal  Neck: no JVD, carotid bruits, or masses Cardiac: regular tachycardic; no murmurs, rubs, or  gallops,no edema  Respiratory:  clear to auscultation bilaterally, normal work of breathing GI: soft, nontender, nondistended, + BS MS: no deformity or atrophy  Skin: warm and dry Neuro: Mild left-sided hemiparesis with facial droop  Psych: euthymic mood, full affect   Labs    High Sensitivity Troponin:   Recent Labs  Lab 10/29/22 1552 10/29/22 2018 10/31/22 1540 10/31/22 1736  TROPONINIHS '13 11 12 14      '$ Chemistry Recent Labs  Lab 10/31/22 0108 11/01/22 0038 11/02/22 0030 11/03/22 0047 11/04/22 0422  NA 139 137 142 140 136  K 4.3 3.7 3.2* 4.3 4.2  CL 109 101 101 101 103  CO2 17* '25 29 29 24  '$ GLUCOSE 134* 151* 109* 140* 173*  BUN 29* 32* 27* 23 16  CREATININE 1.07* 1.26* 1.25* 1.25* 0.86  CALCIUM 9.0 8.8* 9.0 9.3 8.5*  MG 2.2 2.3 2.2 2.5*  --   PROT 6.6 6.7  --   --   --   ALBUMIN 3.1* 3.1*  --   --   --   AST 47* 36  --   --   --   ALT 64* 63*  --   --   --   ALKPHOS 89 84  --   --   --  BILITOT 0.4 0.4  --   --   --   GFRNONAA 53* 43* 44* 44* >60  ANIONGAP '13 11 12 10 9     '$ Lipids  Recent Labs  Lab 10/31/22 0108  CHOL 151  TRIG 61  HDL 53  LDLCALC 86  CHOLHDL 2.8     Hematology Recent Labs  Lab 11/02/22 0030 11/03/22 0047 11/04/22 0422  WBC 13.9* 14.3* 11.0*  RBC 4.27 4.45 4.10  HGB 12.1 12.5 11.3*  HCT 37.3 39.3 36.4  MCV 87.4 88.3 88.8  MCH 28.3 28.1 27.6  MCHC 32.4 31.8 31.0  RDW 15.7* 15.8* 15.7*  PLT 282 348 338    Thyroid  Recent Labs  Lab 10/29/22 1552  TSH 2.462  FREET4 1.02     BNP Recent Labs  Lab 10/31/22 1540 11/01/22 0038  BNP 733.7* 791.8*     DDimer  Recent Labs  Lab 10/29/22 2057  DDIMER 2.37*      Radiology    MR BRAIN WO CONTRAST  Result Date: 11/03/2022 CLINICAL DATA:  Stroke follow-up.  Right M1 occlusion. EXAM: MRI HEAD WITHOUT CONTRAST TECHNIQUE: Multiplanar, multiecho pulse sequences of the brain and surrounding structures were obtained without intravenous contrast. COMPARISON:  Same-day  CT head FINDINGS: Incomplete study due to patient tolerance. Motion degraded axial FLAIR, axial SWI, and axial and coronal DWI sequences were obtained. 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 There is no associated hemorrhage or mass effect. There is no acute intracranial hemorrhage, extra-axial fluid collection, or acute infarct. There is unchanged background parenchymal volume loss with prominence of the ventricular system and extra-axial CSF spaces. The ventricles are similar in size to 2017. There is extensive confluent FLAIR signal abnormality in the supratentorial white matter consistent with advanced underlying chronic small-vessel ischemic change. 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 There is no mass lesion.  There is no mass effect or midline shift. Vascular: Not well assessed on the current study. Skull and upper cervical spine: Grossly unremarkable on the provided sequences. Sinuses/Orbits: The paranasal sinuses are clear. The globes and orbits are grossly unremarkable. Other: None. IMPRESSION: 1. Incomplete, motion degraded study. 2. Small areas of acute infarct in the right frontal and temporal lobe cortex and caudate body without hemorrhage or mass effect. 3. 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 Electronically Signed   By: Valetta Mole [name]   On: 11/03/2022 19:16   CT HEAD CODE STROKE WO CONTRAST  Result Date: 11/03/2022 CLINICAL DATA:  Left-sided deficit and facial droop. EXAM: CT ANGIOGRAPHY HEAD AND NECK TECHNIQUE: Multidetector CT imaging of the head and neck was performed using the standard protocol during bolus administration of  intravenous contrast. Multiplanar CT image reconstructions and MIPs were obtained to evaluate the vascular anatomy. Carotid stenosis measurements (when applicable) are obtained utilizing NASCET criteria, using the distal internal carotid diameter as the denominator. RADIATION DOSE REDUCTION: This exam was performed according to the departmental dose-optimization program which includes automated exposure control, adjustment of the mA and/or kV according to patient size and/or use of iterative reconstruction technique. CONTRAST:  65m OMNIPAQUE IOHEXOL 350 MG/ML SOLN COMPARISON:  Brain MRI 12/30/2015 FINDINGS: CT HEAD FINDINGS Brain: There is no  evidence of acute intracranial hemorrhage, extra-axial fluid collection, or acute territorial infarct. ASPECTS is 10. There is moderate background parenchymal volume loss with prominence of the ventricular system and extra-axial CSF spaces. The ventricles are similar in size to 2017. Extensive confluent hypodensity throughout the supratentorial white matter likely reflects sequela of underlying chronic small-vessel ischemic change There is no mass lesion.  There is no mass effect or midline shift. Vascular: See below. Skull: Normal. Negative for fracture or focal lesion. Sinuses/Orbits: The paranasal sinuses are clear. Bilateral lens implants are in place. The globes and orbits are otherwise unremarkable. Other: None. Review of the MIP images confirms the above findings CTA NECK FINDINGS Aortic arch: The imaged aortic arch is normal. The origins of the left common carotid and subclavian arteries are patent. The brachiocephalic artery origin is not imaged. The subclavian arteries are patent to the level Right carotid system: Imaged. The right common, internal, and external carotid arteries are patent, without hemodynamically significant stenosis or occlusion. There is no dissection or aneurysm. Left carotid system: The left common, internal, and external carotid arteries are  patent, without hemodynamically significant stenosis or occlusion. There is no dissection or aneurysm. Vertebral arteries: The vertebral arteries are patent, without hemodynamically significant stenosis or occlusion. There is no dissection or aneurysm. Skeleton: There is no acute osseous abnormality or suspicious osseous lesion. There is no visible canal hematoma. Other neck: The soft tissues of the neck are unremarkable. Upper chest: The imaged lung apices are clear. Review of the MIP images confirms the above findings CTA HEAD FINDINGS Anterior circulation: The intracranial ICAs are patent. There is a right M1 occlusion. An inferior right M2 branch reconstitutes in the sylvian fissure common but there is overall poor collateral flow in the MCA distribution. The left MCA branches are patent, without proximal stenosis or occlusion. The bilateral ACAs are patent, without proximal stenosis or occlusion. The anterior communicating artery is normal. There is no aneurysm or AVM. Posterior circulation: The bilateral V4 segments are patent. The basilar artery is patent. The major cerebellar arteries appear patent. The bilateral PCAs are patent, without proximal stenosis or occlusion. Posterior communicating arteries are not definitely seen. There is no aneurysm or AVM. Venous sinuses: As permitted by contrast timing, patent. Anatomic variants: None. Review of the MIP images confirms the above findings IMPRESSION: 1. No acute intracranial pathology on initial noncontrast head CT. ASPECTS is 10. 2. 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 3. Otherwise patent vasculature of the head and neck without other hemodynamically significant stenosis or occlusion. Results of the initial noncontrast head CT were paged via Amion to Dr. Erlinda Hong at 2:35 pm. CTA results were discussed at 2:38 pm. Electronically Signed   By: Valetta Mole [name]   On: 11/03/2022 14:44   CT ANGIO HEAD NECK W WO CM (CODE STROKE)  Result  Date: 11/03/2022 CLINICAL DATA:  Left-sided deficit and facial droop. EXAM: CT ANGIOGRAPHY HEAD AND NECK TECHNIQUE: Multidetector CT imaging of the head and neck was performed using the standard protocol during bolus administration of intravenous contrast. Multiplanar CT image reconstructions and MIPs were obtained to evaluate the vascular anatomy. Carotid stenosis measurements (when applicable) are obtained utilizing NASCET criteria, using the distal internal carotid diameter as the denominator. RADIATION DOSE REDUCTION: This exam was performed according to the departmental dose-optimization program which includes automated exposure control, adjustment of the mA and/or kV according to patient size and/or use of iterative reconstruction technique. CONTRAST:  63m OMNIPAQUE IOHEXOL  350 MG/ML SOLN COMPARISON:  Brain MRI 12/30/2015 FINDINGS: CT HEAD FINDINGS Brain: There is no evidence of acute intracranial hemorrhage, extra-axial fluid collection, or acute territorial infarct. ASPECTS is 10. There is moderate background parenchymal volume loss with prominence of the ventricular system and extra-axial CSF spaces. The ventricles are similar in size to 2017. Extensive confluent hypodensity throughout the supratentorial white matter likely reflects sequela of underlying chronic small-vessel ischemic change There is no mass lesion.  There is no mass effect or midline shift. Vascular: See below. Skull: Normal. Negative for fracture or focal lesion. Sinuses/Orbits: The paranasal sinuses are clear. Bilateral lens implants are in place. The globes and orbits are otherwise unremarkable. Other: None. Review of the MIP images confirms the above findings CTA NECK FINDINGS Aortic arch: The imaged aortic arch is normal. The origins of the left common carotid and subclavian arteries are patent. The brachiocephalic artery origin is not imaged. The subclavian arteries are patent to the level Right carotid system: Imaged. The right  common, internal, and external carotid arteries are patent, without hemodynamically significant stenosis or occlusion. There is no dissection or aneurysm. Left carotid system: The left common, internal, and external carotid arteries are patent, without hemodynamically significant stenosis or occlusion. There is no dissection or aneurysm. Vertebral arteries: The vertebral arteries are patent, without hemodynamically significant stenosis or occlusion. There is no dissection or aneurysm. Skeleton: There is no acute osseous abnormality or suspicious osseous lesion. There is no visible canal hematoma. Other neck: The soft tissues of the neck are unremarkable. Upper chest: The imaged lung apices are clear. Review of the MIP images confirms the above findings CTA HEAD FINDINGS Anterior circulation: The intracranial ICAs are patent. There is a right M1 occlusion. An inferior right M2 branch reconstitutes in the sylvian fissure common but there is overall poor collateral flow in the MCA distribution. The left MCA branches are patent, without proximal stenosis or occlusion. The bilateral ACAs are patent, without proximal stenosis or occlusion. The anterior communicating artery is normal. There is no aneurysm or AVM. Posterior circulation: The bilateral V4 segments are patent. The basilar artery is patent. The major cerebellar arteries appear patent. The bilateral PCAs are patent, without proximal stenosis or occlusion. Posterior communicating arteries are not definitely seen. There is no aneurysm or AVM. Venous sinuses: As permitted by contrast timing, patent. Anatomic variants: None. Review of the MIP images confirms the above findings IMPRESSION: 1. No acute intracranial pathology on initial noncontrast head CT. ASPECTS is 10. 2. 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 3. Otherwise patent vasculature of the head and neck without other hemodynamically significant stenosis or occlusion. Results of  the initial noncontrast head CT were paged via Amion to Dr. Erlinda Hong at 2:35 pm. CTA results were discussed at 2:38 pm. Electronically Signed   By: Valetta Mole [name]   On: 11/03/2022 14:44    Cardiac Studies   10/30/22 TTE  IMPRESSIONS     1. Left ventricular ejection fraction, by estimation, is 40 to 45%. The  left ventricle has mildly decreased function. The left ventricle  demonstrates global hypokinesis. There is moderate asymmetric left  ventricular hypertrophy of the basal-septal  segment. Left ventricular diastolic parameters are indeterminate.   2. Right ventricular systolic function is normal. The right ventricular  size is normal. There is normal pulmonary artery systolic pressure.   3. Left atrial size was mildly dilated.   4. The mitral valve is abnormal. Moderate to severe mitral valve  regurgitation. No evidence of mitral stenosis.   5. The aortic valve was not well visualized. Aortic valve regurgitation  is trivial. No aortic stenosis is present.   6. The inferior vena cava is dilated in size with >50% respiratory  variability, suggesting right atrial pressure of 8 mmHg.   Comparison(s): MR is worse from prior report.   FINDINGS   Left Ventricle: Left ventricular ejection fraction, by estimation, is 40  to 45%. The left ventricle has mildly decreased function. The left  ventricle demonstrates global hypokinesis. The left ventricular internal  cavity size was normal in size. There is   moderate asymmetric left ventricular hypertrophy of the basal-septal  segment. Left ventricular diastolic parameters are indeterminate.   Right Ventricle: The right ventricular size is normal. No increase in  right ventricular wall thickness. Right ventricular systolic function is  normal. There is normal pulmonary artery systolic pressure. The tricuspid  regurgitant velocity is 2.72 m/s, and   with an assumed right atrial pressure of 3 mmHg, the estimated right  ventricular systolic  pressure is 35.3 mmHg.   Left Atrium: Left atrial size was mildly dilated.   Right Atrium: Right atrial size was normal in size.   Pericardium: The pericardium was not well visualized.   Mitral Valve: MR volume 52. The mitral valve is abnormal. Mild mitral  annular calcification. 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 No  evidence of mitral valve stenosis.   Tricuspid Valve: The tricuspid valve is not well visualized. Tricuspid  valve regurgitation is not demonstrated. No evidence of tricuspid  stenosis.   Aortic Valve: The aortic valve was not well visualized. Aortic valve  regurgitation is trivial. No aortic stenosis is present.   Pulmonic Valve: The pulmonic valve was not well visualized. Pulmonic valve  regurgitation is not visualized. No evidence of pulmonic stenosis.   Aorta: The aortic root, ascending aorta and aortic arch are all  structurally normal, with no evidence of dilitation or obstruction.   Venous: The inferior vena cava is dilated in size with greater than 50%  respiratory variability, suggesting right atrial pressure of 8 mmHg.   IAS/Shunts: No atrial level shunt detected by color flow Doppler.   Patient Profile     81 y.o. female with a hx of hypothyroidism, GERD, TIA who is being seen 10/30/2022 for the evaluation of new onset afib at the request of Dr. Florene Glen.   Assessment & Plan    1.  Atrial fibrillation/flutter: Currently on diltiazem 360 mg daily and amiodarone IV.  Unfortunately had CVA yesterday likely due to atrial fibrillation and cardioembolic source.  Currently not on anticoagulation.  We Libbey Duce restart per neurology.  She is converted to atrial flutter on amiodarone.  We Caffie Sotto plan to stop amiodarone and plan for rate control to hopefully prevent her from converting to sinus rhythm.  Continue diltiazem.  2.  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  Potentially due to rapid atrial fibrillation.  Estefan Pattison need  repeat echo post cardioversion.  We Jie Stickels hold off on rhythm control due to recent stroke.  3.  Moderate to severe mitral regurgitation: Potentially related to rapid atrial fibrillation.  Reassess with maintenance of sinus rhythm.  4.  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  Likely due to atrial fibrillation.  Currently not anticoagulated due to stroke.  Nissi Doffing restart oral anticoagulation once cleared by neurology.   For questions or updates, please contact [place] Please consult [email or web address] for  contact info under        Signed, Daun Rens Meredith Leeds, MD  11/04/2022, 10:34 AM

## 2022-11-04 NOTE — Progress Notes (Signed)
Physical Therapy Treatment/Re-Eval Patient Details Name: Leslie [name] MRN: [id number] DOB: 13-May-1941 Today's Date: 11/04/2022   History of Present Illness Pt is an 81 y.o. female admitted 10/29/22 after being directed by PCP to ED for afib with RVR; workup also revealed CAP. 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 PMH includes CKD, stroke, arthritis, insomnia, plantar fasciitis, back sx, L THA (2022).    PT Comments    Pt now with rt CVA and thrombectomy since last seen by PT. Mild LLE weakness 4+/5 and decreased sensation of LLE. Goals and DC recommendations updated. Pt remains very motivated and feel she would be an excellent AIR candidate.    Recommendations for follow up therapy are one component of a multi-disciplinary discharge planning process, led by the attending physician.  Recommendations may be updated based on patient status, additional functional criteria and insurance authorization.  Follow Up Recommendations  Acute inpatient rehab (3hours/day)     Assistance Recommended at Discharge Frequent or constant Supervision/Assistance  Patient can return home with the following A little help with walking and/or transfers;A little help with bathing/dressing/bathroom;Assistance with cooking/housework;Assist for transportation;Direct supervision/assist for medications management;Help with stairs or ramp for entrance   Equipment Recommendations  Other (comment) (To be determined)    Recommendations for Other Services Rehab consult     Precautions / Restrictions Precautions Precautions: Fall Restrictions Weight Bearing Restrictions: No     Mobility  Bed Mobility Overal bed mobility: Needs Assistance Bed Mobility: Supine to Sit     Supine to sit: Min assist     General bed mobility comments: Assist to elevate trunk into sitting and bring hips to EOB    Transfers Overall transfer level: Needs  assistance Equipment used: Rolling walker (2 wheels), Rollator (4 wheels) Transfers: Sit to/from Stand Sit to Stand: Min assist, +2 safety/equipment           General transfer comment: min A +2 for safety initally, progressed to +1. Assist to bring hips up and verbal cues for hand placement    Ambulation/Gait Ambulation/Gait assistance: Min assist, +2 safety/equipment Gait Distance (Feet): 80 Feet Assistive device: Rolling walker (2 wheels), Rollator (4 wheels) Gait Pattern/deviations: Step-through pattern, Decreased step length - right, Decreased step length - left, Decreased stride length, Trunk flexed Gait velocity: decr Gait velocity interpretation: <1.31 ft/sec, indicative of household ambulator   General Gait Details: Assist for balance and for management of walker/rollator. Verbal cues to stay closer to walker on turns. 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 Decr attention to left.   Stairs             Wheelchair Mobility    Modified Rankin (Stroke Patients Only) Modified Rankin (Stroke Patients Only) Pre-Morbid Rankin Score: No symptoms Modified Rankin: Moderately severe disability     Balance Overall balance assessment: Needs assistance Sitting-balance support: Feet supported Sitting balance-Leahy Scale: Fair     Standing balance support: Bilateral upper extremity supported, During functional activity Standing balance-Leahy Scale: Poor Standing balance comment: walker and min guard for static standing                            Cognition Arousal/Alertness: Awake/alert Behavior During Therapy: WFL for tasks assessed/performed Overall Cognitive Status: Impaired/Different from baseline Area of Impairment: Attention, Awareness, Problem solving  Current Attention Level: Selective       Awareness: Emergent Problem Solving: Slow processing, Requires verbal cues General  Comments: L inattention, difficulty with dual tasking. Verbose at times, easily re-directed. Motivated!        Exercises      General Comments General comments (skin integrity, edema, etc.): VSS on RA      Pertinent Vitals/Pain Pain Assessment Pain Assessment: Faces Faces Pain Scale: No hurt    Home Living Family/patient expects to be discharged to:: Private residence Living Arrangements: Alone Available Help at Discharge: Neighbor;Available PRN/intermittently Type of Home: House Home Access: Stairs to enter Entrance Stairs-Rails: Right;Left Entrance Stairs-Number of Steps: 2 Alternate Level Stairs-Number of Steps: 4 steps down to main living room w/ rail Home Layout: Two level;Able to live on main level with bedroom/bathroom Home Equipment: Rolling Walker (2 wheels);Cane - single point;Shower seat Additional Comments: "I can call my neighbor if I just have to have help with something." Daughter lives in [place]    Prior Function            PT Goals (current goals can now be found in the care plan section) Acute Rehab PT Goals PT Goal Formulation: With patient Time For Goal Achievement: 11/18/22 Potential to Achieve Goals: Good Progress towards PT goals: Goals downgraded-see care plan    Frequency    Min 4X/week      PT Plan Discharge plan needs to be updated;Frequency needs to be updated    Co-evaluation PT/OT/SLP Co-Evaluation/Treatment: Yes Reason for Co-Treatment: Complexity of the patient's impairments (multi-system involvement);For patient/therapist safety PT goals addressed during session: Mobility/safety with mobility;Proper use of DME OT goals addressed during session: ADL's and self-care      AM-PAC PT "6 Clicks" Mobility   Outcome Measure  Help needed turning from your back to your side while in a flat bed without using bedrails?: A Little Help needed moving from lying on your back to sitting on the side of a flat bed without using bedrails?: A  Little Help needed moving to and from a bed to a chair (including a wheelchair)?: A Little Help needed standing up from a chair using your arms (e.g., wheelchair or bedside chair)?: A Little Help needed to walk in hospital room?: A Little Help needed climbing 3-5 steps with a railing? : Total 6 Click Score: 16    End of Session Equipment Utilized During Treatment: Gait belt Activity Tolerance: Patient tolerated treatment well Patient left: in chair;with call bell/phone within reach;with chair alarm set;with family/visitor present Nurse Communication: Mobility status PT Visit Diagnosis: Other symptoms and signs involving the nervous system (R29.898);Hemiplegia and hemiparesis;Other abnormalities of gait and mobility (R26.89) Hemiplegia - Right/Left: Left Hemiplegia - dominant/non-dominant: Non-dominant Hemiplegia - caused by: Cerebral infarction     Time: 1400-1430 PT Time Calculation (min) (ACUTE ONLY): 30 min  Charges:                        [place] [facility] [place] 11/04/2022, 4:11 PM

## 2022-11-04 NOTE — Evaluation (Signed)
Clinical/Bedside Swallow Evaluation Patient Details  Name: Yazmyne Sara MRN: [id number] Date of Birth: 21-May-1941  Today's Date: 11/04/2022 Time: SLP Start Time (ACUTE ONLY): 0910 SLP Stop Time (ACUTE ONLY): 0943 SLP Time Calculation (min) (ACUTE ONLY): 33 min  Past Medical History:  Past Medical History:  Diagnosis Date   Arthritis    Chronic kidney disease    "stage 3 kidney failure" improving per pt   Headache    Hypothyroidism    Insomnia    Plantar fasciitis    Stomach ulcer ~2000   Stroke [place])    TIA X2   2001, jan 2017   Vitamin D deficiency    Past Surgical History:  Past Surgical History:  Procedure Laterality Date   ABDOMINAL HYSTERECTOMY  1974   ANKLE FUSION Right 06/07/2016   Procedure: RIGHT TALONAVICULAR ARTHRODESIS ;  Surgeon: Wylene Simmer, MD;  Location: [facility];  Service: Orthopedics;  Laterality: Right;   APPENDECTOMY     BACK SURGERY     x4  fusion T12 thru sacrum; U rod in sacrum   CATARACT EXTRACTION, BILATERAL Bilateral 2015   GASTROCNEMIUS RECESSION Right 06/07/2016   Procedure: RIGHT GASTROC RECESSION ;  Surgeon: Wylene Simmer, MD;  Location: [facility];  Service: Orthopedics;  Laterality: Right;   HAND SURGERY Left 2003   x2   L heel surgery for torn tendon Left 2015   PLANTAR FASCIA SURGERY Right 2009   surgery at [place] Right 2007   TENDON REPAIR Right 1949   foot- lacerated tendon in arch of foot   THORACENTESIS N/A 10/30/2022   Procedure: Mathews Robinsons;  Surgeon: Julian Hy, DO;  Location: [facility];  Service: Cardiopulmonary;  Laterality: N/A;   TOTAL HIP ARTHROPLASTY Left 10/05/2021   Procedure: TOTAL HIP ARTHROPLASTY ANTERIOR APPROACH;  Surgeon: Rod Can, MD;  Location: [facility];  Service: Orthopedics;  Laterality: Left;   HPI:  Shawnelle Spoerl is an 81 y.o. Caucasian female with PMH of hypothyroidism and GERD who was originally admitted for community-acquired  pneumonia and atrial fibrillation with RVR.  Patient was recently started on Eliquis, and pneumonia was improving with antibiotics.  While admitted she went to the restroom and developed sudden onset right gaze deviation, left-sided hemiparesis and left-sided neglect.  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  Patient was taken to IR for mechanical thrombectomy. MRI shows Small areas of acute infarct in the right frontal and temporal  lobe cortex and caudate body without hemorrhage or mass effect.    Assessment / Plan / Recommendation  Clinical Impression  Pt demonstrates oral dysphagia following right CVA with left labial and lingual weakness. Pt with signs of sensory impairment as well, no awareness of anterior spillage. WIth cues pt placing food on right and making compensations. Immediate coughing observed with minimal trials of thin liquids. Tolerated nectar thick liquids without cough. Pt is vulnurable given pna on arrival. Will start nectar/dys 2 to given some comfort today and f/u with MBS tomorrow for instrumental assessment and least restrictive diet. SLP Visit Diagnosis: Dysphagia, oropharyngeal phase (R13.12)    Aspiration Risk  Moderate aspiration risk    Diet Recommendation Dysphagia 2 (Fine chop);Nectar-thick liquid   Liquid Administration via: Cup;Straw Medication Administration: Crushed with puree Supervision: Patient able to self feed Compensations: Slow rate;Small sips/bites Postural Changes: Seated upright at 90 degrees    Other  Recommendations Oral Care Recommendations: Oral care BID Other  Recommendations: Have oral suction available    Recommendations for follow up therapy are one component of a multi-disciplinary discharge planning process, led by the attending physician.  Recommendations may be updated based on patient status, additional functional criteria and insurance authorization.  Follow up Recommendations Acute inpatient  rehab (3hours/day)      Assistance Recommended at Discharge    Functional Status Assessment Patient has had a recent decline in their functional status and demonstrates the ability to make significant improvements in function in a reasonable and predictable amount of time.  Frequency and Duration            Prognosis        Swallow Study   General HPI: Twinkle Sockwell is an 81 y.o. Caucasian female with PMH of hypothyroidism and GERD who was originally admitted for community-acquired pneumonia and atrial fibrillation with RVR.  Patient was recently started on Eliquis, and pneumonia was improving with antibiotics.  While admitted she went to the restroom and developed sudden onset right gaze deviation, left-sided hemiparesis and left-sided neglect.  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  Patient was taken to IR for mechanical thrombectomy. MRI shows Small areas of acute infarct in the right frontal and temporal  lobe cortex and caudate body without hemorrhage or mass effect. Type of Study: Bedside Swallow Evaluation Previous Swallow Assessment: none Diet Prior to this Study: NPO Temperature Spikes Noted: No Respiratory Status: Nasal cannula History of Recent Intubation: No Behavior/Cognition: Cooperative;Alert Oral Cavity Assessment: Within Functional Limits;Dry Oral Care Completed by SLP: No Oral Cavity - Dentition: Adequate natural dentition Vision: Functional for self-feeding Self-Feeding Abilities: Needs assist Patient Positioning: Upright in bed Baseline Vocal Quality: Low vocal intensity Volitional Cough: Strong Volitional Swallow: Able to elicit    Oral/Motor/Sensory Function Overall Oral Motor/Sensory Function: Moderate impairment Facial ROM: Reduced left;Suspected CN VII (facial) dysfunction Facial Symmetry: Abnormal symmetry left;Suspected CN VII (facial) dysfunction Facial Strength: Reduced left;Suspected CN VII (facial)  dysfunction Facial Sensation: Reduced left;Suspected CN V (Trigeminal) dysfunction Lingual ROM: Reduced left;Suspected CN XII (hypoglossal) dysfunction Lingual Symmetry: Abnormal symmetry left;Suspected CN XII (hypoglossal) dysfunction Lingual Strength: Suspected CN XII (hypoglossal) dysfunction   Ice Chips Ice chips: Impaired Presentation: Spoon Oral Phase Impairments: Reduced lingual movement/coordination Pharyngeal Phase Impairments: Cough - Immediate   Thin Liquid Thin Liquid: Impaired Presentation: Cup Oral Phase Impairments: Reduced labial seal Oral Phase Functional Implications: Right anterior spillage;Left anterior spillage Pharyngeal  Phase Impairments: Suspected delayed Swallow;Cough - Immediate    Nectar Thick Nectar Thick Liquid: Impaired Oral phase functional implications: Left anterior spillage   Honey Thick Honey Thick Liquid: Not tested   Puree Puree: Within functional limits   Solid     Solid: Impaired Oral Phase Functional Implications: Impaired mastication      [place], Katherene Ponto 11/04/2022,9:50 AM

## 2022-11-04 NOTE — Evaluation (Signed)
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/05/2021   Past Medical History:  Past Medical History:  Diagnosis Date   Arthritis    Chronic kidney disease    "stage 3 kidney failure" improving per pt   Headache    Hypothyroidism    Insomnia    Plantar fasciitis    Stomach ulcer ~2000   Stroke [facility])    TIA X2   2001, jan 2017   Vitamin D deficiency    Past Surgical History:  Past Surgical History:  Procedure Laterality Date   ABDOMINAL HYSTERECTOMY  1974   ANKLE FUSION Right 06/07/2016   Procedure: RIGHT TALONAVICULAR ARTHRODESIS ;  Surgeon: Wylene Simmer, MD;  Location: [facility];  Service: Orthopedics;  Laterality: Right;   APPENDECTOMY     BACK SURGERY     x4  fusion T12 thru sacrum; U rod in sacrum   CATARACT EXTRACTION, BILATERAL Bilateral 2015   GASTROCNEMIUS RECESSION Right 06/07/2016   Procedure: RIGHT GASTROC RECESSION ;  Surgeon: Wylene Simmer, MD;  Location: [place];  Service: Orthopedics;  Laterality: Right;   HAND SURGERY Left 2003   x2   L heel surgery for torn tendon Left 2015   PLANTAR FASCIA SURGERY Right 2009   surgery at [place] Right 2007   TENDON REPAIR Right 1949   foot- lacerated tendon in arch of foot   THORACENTESIS N/A 10/30/2022   Procedure: Mathews Robinsons;  Surgeon: Julian Hy, DO;  Location: [place];  Service: Cardiopulmonary;  Laterality: N/A;   TOTAL HIP ARTHROPLASTY Left 10/05/2021   Procedure: TOTAL HIP  ARTHROPLASTY ANTERIOR APPROACH;  Surgeon: Rod Can, MD;  [facility];  Service: Orthopedics;  Laterality: Left;   HPI:  Leslie [name] is an 81 y.o. Caucasian female with PMH of hypothyroidism and GERD who was originally admitted for community-acquired pneumonia and atrial fibrillation with RVR.  Patient was recently started on Eliquis, and pneumonia was improving with antibiotics.  While admitted she went to the restroom and developed sudden onset right gaze deviation, left-sided hemiparesis and left-sided neglect.  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  Patient was taken to IR for mechanical thrombectomy. MRI shows Small areas of acute infarct in the right frontal and temporal  lobe cortex and caudate body without hemorrhage or mass effect.   Assessment / Plan / Recommendation Clinical Impression  Pt demonstrates cognitive impairment and dysarthria following right frontal and temporal CVA. Pt with left lingual and labial weakness with dysarthric but intelligible conversation level speech. Language WNL. Pt demonstrates impairment in attention, awareness, left neglect. Provided verbal and visual feedback to target pts intellecutal awareness and start to work on compensations as able. Recommend AIR at d/c.    SLP Assessment  SLP Recommendation/Assessment: Patient needs continued Speech [facility] SLP Visit Diagnosis: Cognitive communication deficit (R41.841)    Recommendations for follow up therapy are one component of a multi-disciplinary discharge planning process, led by the attending physician.  Recommendations may be  updated based on patient status, additional functional criteria and insurance authorization.    Follow Up Recommendations  Acute inpatient rehab (3hours/day)    Assistance Recommended at Discharge     Functional Status Assessment Patient has had a recent decline in their functional status and demonstrates  the ability to make significant improvements in function in a reasonable and predictable amount of time.  Frequency and Duration min 2x/week  2 weeks      SLP Evaluation Cognition  Overall Cognitive Status: Impaired/Different from baseline Arousal/Alertness: Awake/alert Orientation Level: Oriented to person;Oriented to place;Disoriented to time;Disoriented to situation Attention: Focused;Sustained;Selective Focused Attention: Appears intact Sustained Attention: Impaired Sustained Attention Impairment: Verbal complex;Functional complex Memory: Impaired Awareness: Impaired Awareness Impairment: Intellectual impairment;Emergent impairment Problem Solving: Impaired Problem Solving Impairment: Functional basic Executive Function: Self Monitoring Self Monitoring: Impaired Self Monitoring Impairment: Verbal basic;Functional basic       Comprehension  Auditory Comprehension Overall Auditory Comprehension: Appears within functional limits for tasks assessed    Expression Verbal Expression Overall Verbal Expression: Appears within functional limits for tasks assessed   Oral / Motor  Oral Motor/Sensory Function Overall Oral Motor/Sensory Function: Moderate impairment Facial ROM: Reduced left;Suspected CN VII (facial) dysfunction Facial Symmetry: Abnormal symmetry left;Suspected CN VII (facial) dysfunction Facial Strength: Reduced left;Suspected CN VII (facial) dysfunction Facial Sensation: Reduced left;Suspected CN V (Trigeminal) dysfunction Lingual ROM: Reduced left;Suspected CN XII (hypoglossal) dysfunction Lingual Symmetry: Abnormal symmetry left;Suspected CN XII (hypoglossal) dysfunction Lingual Strength: Suspected CN XII (hypoglossal) dysfunction Motor Speech Overall Motor Speech: Impaired Respiration: Within functional limits Phonation: Low vocal intensity Articulation: Impaired Level of Impairment: Word Intelligibility: Intelligible Motor Planning: Witnin functional  limits Motor Speech Errors: Aware;Consistent            Leslie [name], Katherene Ponto 11/04/2022, 9:57 AM

## 2022-11-04 NOTE — Plan of Care (Signed)
  Problem: Education: Goal: Knowledge of General Education information will improve Description: Including pain rating scale, medication(s)/side effects and non-pharmacologic comfort measures Outcome: Not Progressing   Problem: Health Behavior/Discharge Planning: Goal: Ability to manage health-related needs will improve Outcome: Not Progressing   Problem: Clinical Measurements: Goal: Ability to maintain clinical measurements within normal limits will improve Outcome: Not Progressing Goal: Will remain free from infection Outcome: Not Progressing Goal: Diagnostic test results will improve Outcome: Not Progressing Goal: Respiratory complications will improve Outcome: Not Progressing Goal: Cardiovascular complication will be avoided Outcome: Not Progressing   Problem: Activity: Goal: Risk for activity intolerance will decrease Outcome: Not Progressing   Problem: Nutrition: Goal: Adequate nutrition will be maintained Outcome: Not Progressing   Problem: Coping: Goal: Level of anxiety will decrease Outcome: Not Progressing   Problem: Elimination: Goal: Will not experience complications related to bowel motility Outcome: Not Progressing Goal: Will not experience complications related to urinary retention Outcome: Not Progressing   Problem: Pain Managment: Goal: General experience of comfort will improve Outcome: Not Progressing   Problem: Safety: Goal: Ability to remain free from injury will improve Outcome: Not Progressing   Problem: Skin Integrity: Goal: Risk for impaired skin integrity will decrease Outcome: Not Progressing   Problem: Education: Goal: Knowledge of disease or condition will improve Outcome: Not Progressing Goal: Knowledge of secondary prevention will improve (MUST DOCUMENT ALL) Outcome: Not Progressing Goal: Knowledge of patient specific risk factors will improve [place] N/A or DELETE if not current risk factor) Outcome: Not Progressing   Problem:  Ischemic Stroke/TIA Tissue Perfusion: Goal: Complications of ischemic stroke/TIA will be minimized Outcome: Not Progressing   Problem: Coping: Goal: Will verbalize positive feelings about self Outcome: Not Progressing Goal: Will identify appropriate support needs Outcome: Not Progressing   Problem: Health Behavior/Discharge Planning: Goal: Ability to manage health-related needs will improve Outcome: Not Progressing Goal: Goals will be collaboratively established with patient/family Outcome: Not Progressing   Problem: Self-Care: Goal: Ability to participate in self-care as condition permits will improve Outcome: Not Progressing Goal: Verbalization of feelings and concerns over difficulty with self-care will improve Outcome: Not Progressing Goal: Ability to communicate needs accurately will improve Outcome: Not Progressing   Problem: Nutrition: Goal: Risk of aspiration will decrease Outcome: Not Progressing Goal: Dietary intake will improve Outcome: Not Progressing   Problem: Education: Goal: Understanding of CV disease, CV risk reduction, and recovery process will improve Outcome: Not Progressing Goal: Individualized Educational Video(s) Outcome: Not Progressing   Problem: Activity: Goal: Ability to return to baseline activity level will improve Outcome: Not Progressing   Problem: Cardiovascular: Goal: Ability to achieve and maintain adequate cardiovascular perfusion will improve Outcome: Not Progressing Goal: Vascular access site(s) Level 0-1 will be maintained Outcome: Not Progressing   Problem: Health Behavior/Discharge Planning: Goal: Ability to safely manage health-related needs after discharge will improve Outcome: Not Progressing

## 2022-11-05 ENCOUNTER — Inpatient Hospital Stay (HOSPITAL_COMMUNITY): Payer: Medicare HMO

## 2022-11-05 ENCOUNTER — Encounter (HOSPITAL_COMMUNITY): Payer: Self-pay | Admitting: Radiology

## 2022-11-05 DIAGNOSIS — I4891 Unspecified atrial fibrillation: Secondary | ICD-10-CM | POA: Diagnosis not present

## 2022-11-05 DIAGNOSIS — I63411 Cerebral infarction due to embolism of right middle cerebral artery: Secondary | ICD-10-CM | POA: Diagnosis not present

## 2022-11-05 LAB — BASIC METABOLIC PANEL
Anion gap: 10 (ref 5–15)
BUN: 15 mg/dL (ref 8–23)
CO2: 28 mmol/L (ref 22–32)
Calcium: 8.9 mg/dL (ref 8.9–10.3)
Chloride: 101 mmol/L (ref 98–111)
Creatinine, Ser: 1.19 mg/dL — ABNORMAL HIGH (ref 0.44–1.00)
GFR, Estimated: 46 mL/min — ABNORMAL LOW (ref >60)
Glucose, Bld: 121 mg/dL — ABNORMAL HIGH (ref 70–99)
Potassium: 4.1 mmol/L (ref 3.5–5.1)
Sodium: 139 mmol/L (ref 135–145)

## 2022-11-05 LAB — CBC
HCT: 37.3 % (ref 36.0–46.0)
Hemoglobin: 12 g/dL (ref 12.0–15.0)
MCH: 28.3 pg (ref 26.0–34.0)
MCHC: 32.2 g/dL (ref 30.0–36.0)
MCV: 88 fL (ref 80.0–100.0)
Platelets: 302 10*3/uL (ref 150–400)
RBC: 4.24 MIL/uL (ref 3.87–5.11)
RDW: 15.6 % — ABNORMAL HIGH (ref 11.5–15.5)
WBC: 14.5 10*3/uL — ABNORMAL HIGH (ref 4.0–10.5)
nRBC: 0 % (ref 0.0–0.2)

## 2022-11-05 NOTE — Progress Notes (Addendum)
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 Estanislado Pandy on 11/25   Subjective:  Patient sitting in bed, NAD. States that she is going great, has been walking and eating without difficulty. She states that her voice sounds different after the stroke.  Possibly going to outpatient rehab.   Allergies: Duloxetine, Voltaren [diclofenac sodium], Lisinopril, Aspirin, Diphenhydramine, Gabapentin, Ultram [tramadol hcl], and Proton pump inhibitors  Medications: Prior to Admission medications   Medication Sig Start Date End Date Taking? Authorizing Provider  acetaminophen (TYLENOL) 325 MG tablet Take 1-2 tablets (325-650 mg total) by mouth every 6 (six) hours as needed for mild pain (pain score 1-3 or temp > 100.5). Patient taking differently: Take 500 mg by mouth every 6 (six) hours as needed for mild pain (pain score 1-3 or temp > 100.5). 10/06/21  Yes Dorothyann Peng, PA-C  Cholecalciferol (VITAMIN D) 50 MCG (2000 UT) tablet Take 2,000 Units by mouth daily.   Yes [provider]  COLLAGEN PO Take 12 g by mouth daily.   Yes [provider]  cyclobenzaprine (FLEXERIL) 10 MG tablet Take 10 mg by mouth 2 (two) times daily as needed for muscle spasms.   Yes [provider]  famotidine (PEPCID) 20 MG tablet Take 20 mg by mouth daily as needed for indigestion or heartburn.   Yes [provider]  Humidifier/Vaporizer Supplies [facility] VAPORIZER SCENT) PADS Inhale 1 Dose into the lungs daily as needed (congestion).   Yes [provider]  levothyroxine (SYNTHROID) 88 MCG tablet Take 88 mcg by mouth daily before breakfast.   Yes [provider]  Polyethyl Glycol-Propyl Glycol (SYSTANE OP) Place 1 drop into both  eyes daily as needed (dry eyes).   Yes [provider]  senna (SENOKOT) 8.6 MG TABS tablet Take 1 tablet (8.6 mg total) by mouth 2 (two) times daily. Patient taking differently: Take 1 tablet by mouth 2 (two) times daily as needed for mild constipation or moderate constipation. 10/06/21  Yes Dorothyann Peng, PA-C     Vital Signs: BP 108/85 (BP Location: Right Arm)   Pulse 100   Temp 97.6 F (36.4 C) (Oral)   Resp 16   Ht 5' 7.5" (1.715 m)   Wt 141 lb 5 oz (64.1 kg)   SpO2 96%   BMI 21.81 kg/m   Physical Exam Vitals reviewed.  Constitutional:      General: She is not in acute distress.    Appearance: She is not ill-appearing.  HENT:     Head: Normocephalic.  Cardiovascular:     Rate and Rhythm: Normal rate and regular rhythm.  Skin:    General: Skin is warm and dry.     Coloration: Skin is not jaundiced or pale.     Comments: Positive dressing on R CFA puncture site. Site is unremarkable with no erythema, edema, tenderness, bleeding or drainage. Dressing is clean, dry, and intact.    Neurological:     Mental Status: She is alert and oriented to person, place, and time.     Comments: Alert, awake, and oriented x 3 Speech and comprehension intact Mild left facial droop  Motor power at least 4/5 all 4 No pronator drift. Fine motor and coordination intact, slower on left, patient right hand dominant  Distal pulses 1+    Psychiatric:        Mood and Affect: Mood normal.        Behavior: Behavior normal.       Imaging: MR BRAIN WO CONTRAST  Result Date: 11/03/2022 CLINICAL DATA:  Stroke follow-up.  Right M1 occlusion. EXAM: MRI HEAD WITHOUT CONTRAST TECHNIQUE: Multiplanar, multiecho pulse sequences of the brain and surrounding structures were obtained without intravenous contrast. COMPARISON:  Same-day CT head FINDINGS: Incomplete study due to patient tolerance. Motion degraded axial FLAIR, axial SWI, and axial and coronal DWI sequences were obtained. Brain:  There are small areas of diffusion restriction in the right frontal and temporal lobe cortex and punctate diffusion restriction in the right caudate body consistent with acute infarcts in the MCA distribution. There is no associated hemorrhage or mass effect. There is no acute intracranial hemorrhage, extra-axial fluid collection, or acute infarct. There is unchanged background parenchymal volume loss with prominence of the ventricular system and extra-axial CSF spaces. The ventricles are similar in size to 2017. There is extensive confluent FLAIR signal abnormality in the supratentorial white matter consistent with advanced underlying chronic small-vessel ischemic change. 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 There is no mass lesion.  There is no mass effect or midline shift. Vascular: Not well assessed on the current study. Skull and upper cervical spine: Grossly unremarkable on the provided sequences. Sinuses/Orbits: The paranasal sinuses are clear. The globes and orbits are grossly unremarkable. Other: None. IMPRESSION: 1. Incomplete, motion degraded study. 2. Small areas of acute infarct in the right frontal and temporal lobe cortex and caudate body without hemorrhage or mass effect. 3. 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 Electronically Signed   By: Valetta Mole [name]   On: 11/03/2022 19:16   CT HEAD CODE STROKE WO CONTRAST  Result Date: 11/03/2022 CLINICAL DATA:  Left-sided deficit and facial droop. EXAM: CT ANGIOGRAPHY HEAD AND NECK TECHNIQUE: Multidetector CT imaging of the head and neck was performed using the standard protocol during bolus administration of intravenous contrast. Multiplanar CT image reconstructions and MIPs were obtained to evaluate the vascular anatomy. Carotid stenosis measurements (when applicable) are  obtained utilizing NASCET criteria, using the distal internal carotid diameter as the denominator. RADIATION DOSE REDUCTION: This exam was performed according to the departmental dose-optimization program which includes automated exposure control, adjustment of the mA and/or kV according to patient size and/or use of iterative reconstruction technique. CONTRAST:  25m OMNIPAQUE IOHEXOL 350 MG/ML SOLN COMPARISON:  Brain MRI 12/30/2015 FINDINGS: CT HEAD FINDINGS Brain: There is no evidence of acute intracranial hemorrhage, extra-axial fluid collection, or acute territorial infarct. ASPECTS is 10. There is moderate background parenchymal volume loss with prominence of the ventricular system and extra-axial CSF spaces. The ventricles are similar in size to 2017. Extensive confluent hypodensity throughout the supratentorial white matter likely reflects sequela of underlying chronic small-vessel ischemic change There is no mass lesion.  There is no mass effect or midline shift. Vascular: See below. Skull: Normal. Negative for fracture or focal lesion. Sinuses/Orbits: The paranasal sinuses are clear. Bilateral lens implants are in place. The globes and orbits are otherwise unremarkable. Other: None. Review of the MIP images confirms the above findings CTA NECK FINDINGS Aortic arch: The imaged aortic arch is normal. The origins of the left common carotid and subclavian arteries are patent.  The brachiocephalic artery origin is not imaged. The subclavian arteries are patent to the level Right carotid system: Imaged. The right common, internal, and external carotid arteries are patent, without hemodynamically significant stenosis or occlusion. There is no dissection or aneurysm. Left carotid system: The left common, internal, and external carotid arteries are patent, without hemodynamically significant stenosis or occlusion. There is no dissection or aneurysm. Vertebral arteries: The vertebral arteries are patent, without  hemodynamically significant stenosis or occlusion. There is no dissection or aneurysm. Skeleton: There is no acute osseous abnormality or suspicious osseous lesion. There is no visible canal hematoma. Other neck: The soft tissues of the neck are unremarkable. Upper chest: The imaged lung apices are clear. Review of the MIP images confirms the above findings CTA HEAD FINDINGS Anterior circulation: The intracranial ICAs are patent. There is a right M1 occlusion. An inferior right M2 branch reconstitutes in the sylvian fissure common but there is overall poor collateral flow in the MCA distribution. The left MCA branches are patent, without proximal stenosis or occlusion. The bilateral ACAs are patent, without proximal stenosis or occlusion. The anterior communicating artery is normal. There is no aneurysm or AVM. Posterior circulation: The bilateral V4 segments are patent. The basilar artery is patent. The major cerebellar arteries appear patent. The bilateral PCAs are patent, without proximal stenosis or occlusion. Posterior communicating arteries are not definitely seen. There is no aneurysm or AVM. Venous sinuses: As permitted by contrast timing, patent. Anatomic variants: None. Review of the MIP images confirms the above findings IMPRESSION: 1. No acute intracranial pathology on initial noncontrast head CT. ASPECTS is 10. 2. 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 3. Otherwise patent vasculature of the head and neck without other hemodynamically significant stenosis or occlusion. Results of the initial noncontrast head CT were paged via Amion to Dr. Erlinda Hong at 2:35 pm. CTA results were discussed at 2:38 pm. Electronically Signed   By: Valetta Mole [name]   On: 11/03/2022 14:44   CT ANGIO HEAD NECK W WO CM (CODE STROKE)  Result Date: 11/03/2022 CLINICAL DATA:  Left-sided deficit and facial droop. EXAM: CT ANGIOGRAPHY HEAD AND NECK TECHNIQUE: Multidetector CT imaging of the head and neck was  performed using the standard protocol during bolus administration of intravenous contrast. Multiplanar CT image reconstructions and MIPs were obtained to evaluate the vascular anatomy. Carotid stenosis measurements (when applicable) are obtained utilizing NASCET criteria, using the distal internal carotid diameter as the denominator. RADIATION DOSE REDUCTION: This exam was performed according to the departmental dose-optimization program which includes automated exposure control, adjustment of the mA and/or kV according to patient size and/or use of iterative reconstruction technique. CONTRAST:  78m OMNIPAQUE IOHEXOL 350 MG/ML SOLN COMPARISON:  Brain MRI 12/30/2015 FINDINGS: CT HEAD FINDINGS Brain: There is no evidence of acute intracranial hemorrhage, extra-axial fluid collection, or acute territorial infarct. ASPECTS is 10. There is moderate background parenchymal volume loss with prominence of the ventricular system and extra-axial CSF spaces. The ventricles are similar in size to 2017. Extensive confluent hypodensity throughout the supratentorial white matter likely reflects sequela of underlying chronic small-vessel ischemic change There is no mass lesion.  There is no mass effect or midline shift. Vascular: See below. Skull: Normal. Negative for fracture or focal lesion. Sinuses/Orbits: The paranasal sinuses are clear. Bilateral lens implants are in place. The globes and orbits are otherwise unremarkable. Other: None. Review of the MIP images confirms the above findings CTA NECK FINDINGS Aortic arch: The imaged  aortic arch is normal. The origins of the left common carotid and subclavian arteries are patent. The brachiocephalic artery origin is not imaged. The subclavian arteries are patent to the level Right carotid system: Imaged. The right common, internal, and external carotid arteries are patent, without hemodynamically significant stenosis or occlusion. There is no dissection or aneurysm. Left carotid  system: The left common, internal, and external carotid arteries are patent, without hemodynamically significant stenosis or occlusion. There is no dissection or aneurysm. Vertebral arteries: The vertebral arteries are patent, without hemodynamically significant stenosis or occlusion. There is no dissection or aneurysm. Skeleton: There is no acute osseous abnormality or suspicious osseous lesion. There is no visible canal hematoma. Other neck: The soft tissues of the neck are unremarkable. Upper chest: The imaged lung apices are clear. Review of the MIP images confirms the above findings CTA HEAD FINDINGS Anterior circulation: The intracranial ICAs are patent. There is a right M1 occlusion. An inferior right M2 branch reconstitutes in the sylvian fissure common but there is overall poor collateral flow in the MCA distribution. The left MCA branches are patent, without proximal stenosis or occlusion. The bilateral ACAs are patent, without proximal stenosis or occlusion. The anterior communicating artery is normal. There is no aneurysm or AVM. Posterior circulation: The bilateral V4 segments are patent. The basilar artery is patent. The major cerebellar arteries appear patent. The bilateral PCAs are patent, without proximal stenosis or occlusion. Posterior communicating arteries are not definitely seen. There is no aneurysm or AVM. Venous sinuses: As permitted by contrast timing, patent. Anatomic variants: None. Review of the MIP images confirms the above findings IMPRESSION: 1. No acute intracranial pathology on initial noncontrast head CT. ASPECTS is 10. 2. 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 3. Otherwise patent vasculature of the head and neck without other hemodynamically significant stenosis or occlusion. Results of the initial noncontrast head CT were paged via Amion to Dr. Erlinda Hong at 2:35 pm. CTA results were discussed at 2:38 pm. Electronically Signed   By: Valetta Mole [name]   On:  11/03/2022 14:44    Labs:  CBC: Recent Labs    11/02/22 0030 11/03/22 0047 11/04/22 0422 11/05/22 0531  WBC 13.9* 14.3* 11.0* 14.5*  HGB 12.1 12.5 11.3* 12.0  HCT 37.3 39.3 36.4 37.3  PLT 282 348 338 302     COAGS: No results for input(s): "INR", "APTT" in the last 8760 hours.  BMP: Recent Labs    11/02/22 0030 11/03/22 0047 11/04/22 0422 11/05/22 0531  NA 142 140 136 139  K 3.2* 4.3 4.2 4.1  CL 101 101 103 101  CO2 '29 29 24 28  '$ GLUCOSE 109* 140* 173* 121*  BUN 27* '23 16 15  '$ CALCIUM 9.0 9.3 8.5* 8.9  CREATININE 1.25* 1.25* 0.86 1.19*  GFRNONAA 44* 44* >60 46*     LIVER FUNCTION TESTS: Recent Labs    10/31/22 0108 11/01/22 0038  BILITOT 0.4 0.4  AST 47* 36  ALT 64* 63*  ALKPHOS 89 84  PROT 6.6 6.7  ALBUMIN 3.1* 3.1*     Assessment and Plan:  81 yo female with recent community acquired PNA, afib with RVR on Eliquis, sudden onset of right gaze deviation, left-sided hemiparesis and left-sided neglect on 11/25, found to have occluded right middle cerebral artery mid M1 segment, s/p complete revascularization of occluded right middle cerebral artery M1 segment with one pass achieving a TICI 3 revascularization by Dr. Estanislado Pandy on 11/25  Patient doing great, still has mild left facial droop, motor power good all 4, a/o x 3.  R CFA puncture site w/o appreciable pseudoaneurysm, DP 1+ bilaterally.  Patient states that she might be discharged to outpatient rehab.  No further follow up with NIR needed, plan per neurology.    Electronically Signed: Tera Mater, PA-C 11/05/2022, 8:47 AM    I spent a total of 25 Minutes at the the patient's bedside AND on the patient's hospital floor or unit, greater than 50% of which was counseling/coordinating care for f/u stroke/cerebral intervention.

## 2022-11-05 NOTE — Progress Notes (Signed)
? ?  Inpatient Rehab Admissions Coordinator : ? ?Per therapy recommendations, patient was screened for CIR candidacy by Sequoyah Counterman RN MSN.  At this time patient appears to be a potential candidate for CIR. I will place a rehab consult per protocol for full assessment. Please call me with any questions. ? ?Sianni Cloninger RN MSN ?Admissions Coordinator ?[phone] ?  ?

## 2022-11-05 NOTE — Progress Notes (Signed)
Physical Therapy Treatment Patient Details Name: Leslie [name] MRN: [id number] DOB: 05-Apr-1941 Today's Date: 11/05/2022   History of Present Illness Pt is an 81 y.o. female admitted 10/29/22 after being directed by PCP to ED for afib with RVR; workup also revealed CAP. 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 PMH includes CKD, stroke, arthritis, insomnia, plantar fasciitis, back sx, L THA (2022).    PT Comments    Pt demonstrates improving left sided strength and attention and is motivated to participate. Pt ambulating limited hallway distances with a walker and min assist. Emphasis on equal step lengths and increasing gait speed as tolerated. Pt continues with left hemiplegia, sensory deficits, cognitive impairments, decreased balance, and dysarthria. Continue to recommend acute inpatient rehab (AIR) for post-acute therapy needs.   Recommendations for follow up therapy are one component of a multi-disciplinary discharge planning process, led by the attending physician.  Recommendations may be updated based on patient status, additional functional criteria and insurance authorization.  Follow Up Recommendations  Acute inpatient rehab (3hours/day)     Assistance Recommended at Discharge Frequent or constant Supervision/Assistance  Patient can return home with the following A little help with walking and/or transfers;A little help with bathing/dressing/bathroom;Assistance with cooking/housework;Assist for transportation;Direct supervision/assist for medications management;Help with stairs or ramp for entrance   Equipment Recommendations  Other (comment) (TBA)    Recommendations for Other Services       Precautions / Restrictions Precautions Precautions: Fall Restrictions Weight Bearing Restrictions: No     Mobility  Bed Mobility               General bed mobility comments: OOB in chair     Transfers Overall transfer level: Needs assistance Equipment used: Rolling walker (2 wheels) Transfers: Sit to/from Stand Sit to Stand: Min assist           General transfer comment: MinA from lower toilet surface. Cues for hand placement    Ambulation/Gait Ambulation/Gait assistance: Min assist Gait Distance (Feet): 120 Feet Assistive device: Rolling walker (2 wheels) Gait Pattern/deviations: Step-through pattern, Decreased step length - right, Decreased stride length, Trunk flexed Gait velocity: decreased     General Gait Details: Verbal cues for larger step lengths, increased gait speed, minA for steering RW. Tendency to keep neck flexed due to vertigo   Stairs             Wheelchair Mobility    Modified Rankin (Stroke Patients Only) Modified Rankin (Stroke Patients Only) Pre-Morbid Rankin Score: No symptoms Modified Rankin: Moderately severe disability     Balance Overall balance assessment: Needs assistance Sitting-balance support: Feet supported Sitting balance-Leahy Scale: Fair     Standing balance support: Bilateral upper extremity supported, During functional activity Standing balance-Leahy Scale: Poor                              Cognition Arousal/Alertness: Awake/alert Behavior During Therapy: WFL for tasks assessed/performed Overall Cognitive Status: Impaired/Different from baseline Area of Impairment: Attention, Awareness, Problem solving                   Current Attention Level: Selective       Awareness: Emergent Problem Solving: Slow processing, Requires verbal cues General Comments: Difficulty with dual tasking        Exercises      General Comments        Pertinent Vitals/Pain  Pain Assessment Pain Assessment: No/denies pain    Home Living                          Prior Function            PT Goals (current goals can now be found in the care plan section) Acute Rehab PT  Goals Potential to Achieve Goals: Good Progress towards PT goals: Progressing toward goals    Frequency    Min 4X/week      PT Plan Current plan remains appropriate    Co-evaluation              AM-PAC PT "6 Clicks" Mobility   Outcome Measure  Help needed turning from your back to your side while in a flat bed without using bedrails?: A Little Help needed moving from lying on your back to sitting on the side of a flat bed without using bedrails?: A Little Help needed moving to and from a bed to a chair (including a wheelchair)?: A Little Help needed standing up from a chair using your arms (e.g., wheelchair or bedside chair)?: A Little Help needed to walk in hospital room?: A Little Help needed climbing 3-5 steps with a railing? : A Lot 6 Click Score: 17    End of Session Equipment Utilized During Treatment: Gait belt Activity Tolerance: Patient tolerated treatment well Patient left: in chair;with call bell/phone within reach;with chair alarm set;with family/visitor present Nurse Communication: Mobility status PT Visit Diagnosis: Other symptoms and signs involving the nervous system (R29.898);Hemiplegia and hemiparesis;Other abnormalities of gait and mobility (R26.89) Hemiplegia - Right/Left: Left Hemiplegia - dominant/non-dominant: Non-dominant Hemiplegia - caused by: Cerebral infarction     Time: 6160-7371 PT Time Calculation (min) (ACUTE ONLY): 33 min  Charges:  $Gait Training: 8-22 mins $Therapeutic Activity: 8-22 mins                     Wyona Almas, PT, DPT Acute Rehabilitation Services Office [phone]    [name] 11/05/2022, 1:07 PM

## 2022-11-05 NOTE — Progress Notes (Signed)
Cardiology Progress Note  Patient ID: Leslie [name] MRN: [id number] DOB: [birth year] Date of Encounter: 11/05/2022  Primary Cardiologist: Peter Martinique, MD  Subjective   Chief Complaint: Weakness  HPI: Recovering from stroke.  Still with left-sided facial droop and left-sided weakness.  A-fib rates are controlled.  ROS:  All other ROS reviewed and negative. Pertinent positives noted in the HPI.     Inpatient Medications  Scheduled Meds:  apixaban  5 mg Oral BID   Chlorhexidine Gluconate Cloth  6 each Topical Daily   vitamin B-12  1,000 mcg Oral Daily   diltiazem  360 mg Oral Daily   famotidine  20 mg Oral Daily   furosemide  40 mg Oral Daily   levothyroxine  88 mcg Oral Q0600   mouth rinse  15 mL Mouth Rinse 4 times per day   rosuvastatin  10 mg Oral Daily   Continuous Infusions:  sodium chloride 50 mL/hr at 11/05/22 0900   PRN Meds: calcium carbonate, guaiFENesin, ipratropium-albuterol, melatonin, metoprolol tartrate, ondansetron, mouth rinse   Vital Signs   Vitals:   11/05/22 0700 11/05/22 0757 11/05/22 0800 11/05/22 0900  BP: (!) 112/54  108/85   Pulse: (!) 104  100 84  Resp: '20  16 20  '$ Temp:  97.6 F (36.4 C) 97.6 F (36.4 C)   TempSrc:  Oral Oral   SpO2: 99%  96% 96%  Weight:      Height:        Intake/Output Summary (Last 24 hours) at 11/05/2022 0951 Last data filed at 11/05/2022 0900 Gross per 24 hour  Intake 1617.02 ml  Output 2800 ml  Net -1182.98 ml      11/04/2022    4:00 AM 11/03/2022    9:00 PM 11/03/2022    5:04 AM  Last 3 Weights  Weight (lbs) 141 lb 5 oz 141 lb 5 oz 164 lb 10.9 oz  Weight (kg) 64.1 kg 64.1 kg 74.7 kg      Telemetry  Overnight telemetry shows A-fib heart rate 90 to 110 bpm, which I personally reviewed.    Physical Exam   Vitals:   11/05/22 0700 11/05/22 0757 11/05/22 0800 11/05/22 0900  BP: (!) 112/54  108/85   Pulse: (!) 104  100 84  Resp: '20  16 20  '$ Temp:  97.6 F (36.4 C) 97.6 F (36.4 C)    TempSrc:  Oral Oral   SpO2: 99%  96% 96%  Weight:      Height:        Intake/Output Summary (Last 24 hours) at 11/05/2022 0951 Last data filed at 11/05/2022 0900 Gross per 24 hour  Intake 1617.02 ml  Output 2800 ml  Net -1182.98 ml       11/04/2022    4:00 AM 11/03/2022    9:00 PM 11/03/2022    5:04 AM  Last 3 Weights  Weight (lbs) 141 lb 5 oz 141 lb 5 oz 164 lb 10.9 oz  Weight (kg) 64.1 kg 64.1 kg 74.7 kg    Body mass index is 21.81 kg/m.   General: Well nourished, well developed, in no acute distress Head: Atraumatic, normal size  Eyes: PEERLA, EOMI  Neck: Supple, no JVD Endocrine: No thryomegaly Cardiac: Normal S1, S2; irregular rhythm, no murmurs Lungs: Diminished breath sounds bilaterally Abd: Soft, nontender, no hepatomegaly  Ext: No edema, pulses 2+ Musculoskeletal: No deformities Skin: Warm and dry, no rashes   Neuro: Left-sided facial droop noted, weakness in the left extremities  Labs  High Sensitivity Troponin:   Recent Labs  Lab 10/29/22 1552 10/29/22 2018 10/31/22 1540 10/31/22 1736  TROPONINIHS '13 11 12 14     '$ Cardiac EnzymesNo results for input(s): "TROPONINI" in the last 168 hours. No results for input(s): "TROPIPOC" in the last 168 hours.  Chemistry Recent Labs  Lab 10/31/22 0108 11/01/22 0038 11/02/22 0030 11/03/22 0047 11/04/22 0422 11/05/22 0531  NA 139 137   < > 140 136 139  K 4.3 3.7   < > 4.3 4.2 4.1  CL 109 101   < > 101 103 101  CO2 17* 25   < > '29 24 28  '$ GLUCOSE 134* 151*   < > 140* 173* 121*  BUN 29* 32*   < > '23 16 15  '$ CREATININE 1.07* 1.26*   < > 1.25* 0.86 1.19*  CALCIUM 9.0 8.8*   < > 9.3 8.5* 8.9  PROT 6.6 6.7  --   --   --   --   ALBUMIN 3.1* 3.1*  --   --   --   --   AST 47* 36  --   --   --   --   ALT 64* 63*  --   --   --   --   ALKPHOS 89 84  --   --   --   --   BILITOT 0.4 0.4  --   --   --   --   GFRNONAA 53* 43*   < > 44* >60 46*  ANIONGAP 13 11   < > '10 9 10   '$ < > = values in this interval not  displayed.    Hematology Recent Labs  Lab 11/03/22 0047 11/04/22 0422 11/05/22 0531  WBC 14.3* 11.0* 14.5*  RBC 4.45 4.10 4.24  HGB 12.5 11.3* 12.0  HCT 39.3 36.4 37.3  MCV 88.3 88.8 88.0  MCH 28.1 27.6 28.3  MCHC 31.8 31.0 32.2  RDW 15.8* 15.7* 15.6*  PLT 348 338 302   BNP Recent Labs  Lab 10/31/22 1540 11/01/22 0038  BNP 733.7* 791.8*    DDimer  Recent Labs  Lab 10/29/22 2057  DDIMER 2.37*     Radiology  MR BRAIN WO CONTRAST  Result Date: 11/03/2022 CLINICAL DATA:  Stroke follow-up.  Right M1 occlusion. EXAM: MRI HEAD WITHOUT CONTRAST TECHNIQUE: Multiplanar, multiecho pulse sequences of the brain and surrounding structures were obtained without intravenous contrast. COMPARISON:  Same-day CT head FINDINGS: Incomplete study due to patient tolerance. Motion degraded axial FLAIR, axial SWI, and axial and coronal DWI sequences were obtained. 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 There is no associated hemorrhage or mass effect. There is no acute intracranial hemorrhage, extra-axial fluid collection, or acute infarct. There is unchanged background parenchymal volume loss with prominence of the ventricular system and extra-axial CSF spaces. The ventricles are similar in size to 2017. There is extensive confluent FLAIR signal abnormality in the supratentorial white matter consistent with advanced underlying chronic small-vessel ischemic change. 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 There is no mass lesion.  There is no mass effect or midline shift. Vascular: Not well assessed on the current study. Skull and upper cervical spine: Grossly unremarkable on the provided sequences. Sinuses/Orbits: The paranasal sinuses are clear. The globes and  orbits are  grossly unremarkable. Other: None. IMPRESSION: 1. Incomplete, motion degraded study. 2. Small areas of acute infarct in the right frontal and temporal lobe cortex and caudate body without hemorrhage or mass effect. 3. 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 Electronically Signed   By: Valetta Mole [name]   On: 11/03/2022 19:16   CT HEAD CODE STROKE WO CONTRAST  Result Date: 11/03/2022 CLINICAL DATA:  Left-sided deficit and facial droop. EXAM: CT ANGIOGRAPHY HEAD AND NECK TECHNIQUE: Multidetector CT imaging of the head and neck was performed using the standard protocol during bolus administration of intravenous contrast. Multiplanar CT image reconstructions and MIPs were obtained to evaluate the vascular anatomy. Carotid stenosis measurements (when applicable) are obtained utilizing NASCET criteria, using the distal internal carotid diameter as the denominator. RADIATION DOSE REDUCTION: This exam was performed according to the departmental dose-optimization program which includes automated exposure control, adjustment of the mA and/or kV according to patient size and/or use of iterative reconstruction technique. CONTRAST:  76m OMNIPAQUE IOHEXOL 350 MG/ML SOLN COMPARISON:  Brain MRI 12/30/2015 FINDINGS: CT HEAD FINDINGS Brain: There is no evidence of acute intracranial hemorrhage, extra-axial fluid collection, or acute territorial infarct. ASPECTS is 10. There is moderate background parenchymal volume loss with prominence of the ventricular system and extra-axial CSF spaces. The ventricles are similar in size to 2017. Extensive confluent hypodensity throughout the supratentorial white matter likely reflects sequela of underlying chronic small-vessel ischemic change There is no mass lesion.  There is no mass effect or midline shift. Vascular: See below. Skull: Normal. Negative for fracture or focal  lesion. Sinuses/Orbits: The paranasal sinuses are clear. Bilateral lens implants are in place. The globes and orbits are otherwise unremarkable. Other: None. Review of the MIP images confirms the above findings CTA NECK FINDINGS Aortic arch: The imaged aortic arch is normal. The origins of the left common carotid and subclavian arteries are patent. The brachiocephalic artery origin is not imaged. The subclavian arteries are patent to the level Right carotid system: Imaged. The right common, internal, and external carotid arteries are patent, without hemodynamically significant stenosis or occlusion. There is no dissection or aneurysm. Left carotid system: The left common, internal, and external carotid arteries are patent, without hemodynamically significant stenosis or occlusion. There is no dissection or aneurysm. Vertebral arteries: The vertebral arteries are patent, without hemodynamically significant stenosis or occlusion. There is no dissection or aneurysm. Skeleton: There is no acute osseous abnormality or suspicious osseous lesion. There is no visible canal hematoma. Other neck: The soft tissues of the neck are unremarkable. Upper chest: The imaged lung apices are clear. Review of the MIP images confirms the above findings CTA HEAD FINDINGS Anterior circulation: The intracranial ICAs are patent. There is a right M1 occlusion. An inferior right M2 branch reconstitutes in the sylvian fissure common but there is overall poor collateral flow in the MCA distribution. The left MCA branches are patent, without proximal stenosis or occlusion. The bilateral ACAs are patent, without proximal stenosis or occlusion. The anterior communicating artery is normal. There is no aneurysm or AVM. Posterior circulation: The bilateral V4 segments are patent. The basilar artery is patent. The major cerebellar arteries appear patent. The bilateral PCAs are patent, without proximal stenosis or occlusion. Posterior communicating  arteries are not definitely seen. There is no aneurysm or AVM. Venous sinuses: As permitted by contrast timing, patent. Anatomic variants: None. Review of the MIP  images confirms the above findings IMPRESSION: 1. No acute intracranial pathology on initial noncontrast head CT. ASPECTS is 10. 2. 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 3. Otherwise patent vasculature of the head and neck without other hemodynamically significant stenosis or occlusion. Results of the initial noncontrast head CT were paged via Amion to Dr. Erlinda Hong at 2:35 pm. CTA results were discussed at 2:38 pm. Electronically Signed   By: Valetta Mole [name]   On: 11/03/2022 14:44   CT ANGIO HEAD NECK W WO CM (CODE STROKE)  Result Date: 11/03/2022 CLINICAL DATA:  Left-sided deficit and facial droop. EXAM: CT ANGIOGRAPHY HEAD AND NECK TECHNIQUE: Multidetector CT imaging of the head and neck was performed using the standard protocol during bolus administration of intravenous contrast. Multiplanar CT image reconstructions and MIPs were obtained to evaluate the vascular anatomy. Carotid stenosis measurements (when applicable) are obtained utilizing NASCET criteria, using the distal internal carotid diameter as the denominator. RADIATION DOSE REDUCTION: This exam was performed according to the departmental dose-optimization program which includes automated exposure control, adjustment of the mA and/or kV according to patient size and/or use of iterative reconstruction technique. CONTRAST:  58m OMNIPAQUE IOHEXOL 350 MG/ML SOLN COMPARISON:  Brain MRI 12/30/2015 FINDINGS: CT HEAD FINDINGS Brain: There is no evidence of acute intracranial hemorrhage, extra-axial fluid collection, or acute territorial infarct. ASPECTS is 10. There is moderate background parenchymal volume loss with prominence of the ventricular system and extra-axial CSF spaces. The ventricles are similar in size to 2017. Extensive confluent hypodensity throughout the  supratentorial white matter likely reflects sequela of underlying chronic small-vessel ischemic change There is no mass lesion.  There is no mass effect or midline shift. Vascular: See below. Skull: Normal. Negative for fracture or focal lesion. Sinuses/Orbits: The paranasal sinuses are clear. Bilateral lens implants are in place. The globes and orbits are otherwise unremarkable. Other: None. Review of the MIP images confirms the above findings CTA NECK FINDINGS Aortic arch: The imaged aortic arch is normal. The origins of the left common carotid and subclavian arteries are patent. The brachiocephalic artery origin is not imaged. The subclavian arteries are patent to the level Right carotid system: Imaged. The right common, internal, and external carotid arteries are patent, without hemodynamically significant stenosis or occlusion. There is no dissection or aneurysm. Left carotid system: The left common, internal, and external carotid arteries are patent, without hemodynamically significant stenosis or occlusion. There is no dissection or aneurysm. Vertebral arteries: The vertebral arteries are patent, without hemodynamically significant stenosis or occlusion. There is no dissection or aneurysm. Skeleton: There is no acute osseous abnormality or suspicious osseous lesion. There is no visible canal hematoma. Other neck: The soft tissues of the neck are unremarkable. Upper chest: The imaged lung apices are clear. Review of the MIP images confirms the above findings CTA HEAD FINDINGS Anterior circulation: The intracranial ICAs are patent. There is a right M1 occlusion. An inferior right M2 branch reconstitutes in the sylvian fissure common but there is overall poor collateral flow in the MCA distribution. The left MCA branches are patent, without proximal stenosis or occlusion. The bilateral ACAs are patent, without proximal stenosis or occlusion. The anterior communicating artery is normal. There is no aneurysm or  AVM. Posterior circulation: The bilateral V4 segments are patent. The basilar artery is patent. The major cerebellar arteries appear patent. The bilateral PCAs are patent, without proximal stenosis or occlusion. Posterior communicating arteries are not definitely seen. There is no aneurysm or  AVM. Venous sinuses: As permitted by contrast timing, patent. Anatomic variants: None. Review of the MIP images confirms the above findings IMPRESSION: 1. No acute intracranial pathology on initial noncontrast head CT. ASPECTS is 10. 2. 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 3. Otherwise patent vasculature of the head and neck without other hemodynamically significant stenosis or occlusion. Results of the initial noncontrast head CT were paged via Amion to Dr. Erlinda Hong at 2:35 pm. CTA results were discussed at 2:38 pm. Electronically Signed   By: Valetta Mole [name]   On: 11/03/2022 14:44    Cardiac Studies  TTE 10/30/2022  1. Left ventricular ejection fraction, by estimation, is 40 to 45%. The  left ventricle has mildly decreased function. The left ventricle  demonstrates global hypokinesis. There is moderate asymmetric left  ventricular hypertrophy of the basal-septal  segment. Left ventricular diastolic parameters are indeterminate.   2. Right ventricular systolic function is normal. The right ventricular  size is normal. There is normal pulmonary artery systolic pressure.   3. Left atrial size was mildly dilated.   4. The mitral valve is abnormal. Moderate to severe mitral valve  regurgitation. No evidence of mitral stenosis.   5. The aortic valve was not well visualized. Aortic valve regurgitation  is trivial. No aortic stenosis is present.   6. The inferior vena cava is dilated in size with >50% respiratory  variability, suggesting right atrial pressure of 8 mmHg.   Patient Profile  Leslie [name] is a 81 y.o. female with hypothyroidism, TIA, GERD who was admitted on 10/30/2022  with pneumonia and new onset atrial fibrillation.  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  Assessment & Plan   #Persistent atrial fibrillation/flutter -Initially was planned for rate control strategy with outpatient cardioversion in setting of pneumonia.  Now suffered stroke.  Do agree to continue with plan for outpatient cardioversion.  She needs time to heal from her stroke as well. -Continue diltiazem 360 mg daily.  EF 45%.  Okay for calcium channel blocker. -On Eliquis 5 mg twice daily. -We will plan for outpatient follow-up in 3 to 4 weeks.  We can then evaluate her candidacy for cardioversion.  #Acute systolic heart failure, EF 40-45% -Likely related to her atrial fibrillation.  No signs of volume overload.  Blood pressure is soft.  Just had a stroke.  We will just continue with rate control strategy as above.  Would reevaluate her EF after restoration of sinus rhythm. -Of note EF closer to 50% on my review.  #Moderate to severe mitral valve regurgitation -No significant murmur.  Suspect this is related to her A-fib.  Plan for rate control with consideration of outpatient cardioversion at a later date.  #Right MCA stroke -Status post mechanical thrombectomy. -Now on Eliquis. -She will need to recover from this.  Lucas will sign off.   Medication Recommendations: Rate control strategy as above.  Continue Eliquis. Other recommendations (labs, testing, etc): None. Follow up as an outpatient: We will arrange outpatient follow-up in 3 to 4 weeks to hopefully set her up for a cardioversion.  For questions or updates, please contact [place] Please consult [email or web address] for contact info under   Signed, [place] T. Audie Box, MD, [place]  11/05/2022 9:51 AM

## 2022-11-05 NOTE — Progress Notes (Signed)
STROKE TEAM PROGRESS NOTE   SUBJECTIVE (INTERVAL HISTORY) Her son in law is at the bedside.  Overall her condition continues to improve. Left facial droop improved and left UE proximal improved, still has left hand weakness. Still in afib but rate controlled, off amiodarone.   OBJECTIVE Temp:  [96.7 F (35.9 C)-98 F (36.7 C)] 97.7 F (36.5 C) (11/27 1600) Pulse Rate:  [72-139] 95 (11/27 1700) Cardiac Rhythm: Atrial fibrillation (11/27 1700) Resp:  [13-25] 15 (11/27 1700) BP: (84-149)/(54-132) 101/89 (11/27 1700) SpO2:  [91 %-100 %] 93 % (11/27 1700)  Recent Labs  Lab 11/03/22 1357  GLUCAP 149*   Recent Labs  Lab 10/31/22 0108 11/01/22 0038 11/02/22 0030 11/03/22 0047 11/04/22 0422 11/05/22 0531  NA 139 137 142 140 136 139  K 4.3 3.7 3.2* 4.3 4.2 4.1  CL 109 101 101 101 103 101  CO2 17* '25 29 29 24 28  '$ GLUCOSE 134* 151* 109* 140* 173* 121*  BUN 29* 32* 27* '23 16 15  '$ CREATININE 1.07* 1.26* 1.25* 1.25* 0.86 1.19*  CALCIUM 9.0 8.8* 9.0 9.3 8.5* 8.9  MG 2.2 2.3 2.2 2.5*  --   --   PHOS 3.3 3.5 3.9 4.0  --   --    Recent Labs  Lab 10/31/22 0108 11/01/22 0038  AST 47* 36  ALT 64* 63*  ALKPHOS 89 84  BILITOT 0.4 0.4  PROT 6.6 6.7  ALBUMIN 3.1* 3.1*   Recent Labs  Lab 10/31/22 0108 11/01/22 0038 11/02/22 0030 11/03/22 0047 11/04/22 0422 11/05/22 0531  WBC 14.4* 17.0* 13.9* 14.3* 11.0* 14.5*  NEUTROABS 11.4* 11.7*  --   --  9.5*  --   HGB 9.9* 11.3* 12.1 12.5 11.3* 12.0  HCT 32.9* 36.2 37.3 39.3 36.4 37.3  MCV 91.4 88.7 87.4 88.3 88.8 88.0  PLT 327 328 282 348 338 302   No results for input(s): "CKTOTAL", "CKMB", "CKMBINDEX", "TROPONINI" in the last 168 hours. No results for input(s): "LABPROT", "INR" in the last 72 hours. No results for input(s): "COLORURINE", "LABSPEC", "PHURINE", "GLUCOSEU", "HGBUR", "BILIRUBINUR", "KETONESUR", "PROTEINUR", "UROBILINOGEN", "NITRITE", "LEUKOCYTESUR" in the last 72 hours.  Invalid input(s): "APPERANCEUR"     Component  Value Date/Time   CHOL 151 10/31/2022 0108   TRIG 61 10/31/2022 0108   HDL 53 10/31/2022 0108   CHOLHDL 2.8 10/31/2022 0108   VLDL 12 10/31/2022 0108   LDLCALC 86 10/31/2022 0108   Lab Results  Component Value Date   HGBA1C 6.2 (H) 10/30/2022   No results found for: "LABOPIA", "COCAINSCRNUR", "LABBENZ", "AMPHETMU", "THCU", "LABBARB"  No results for input(s): "ETH" in the last 168 hours.  I have personally reviewed the radiological images below and agree with the radiology interpretations.  MR BRAIN WO CONTRAST  Result Date: 11/03/2022 CLINICAL DATA:  Stroke follow-up.  Right M1 occlusion. EXAM: MRI HEAD WITHOUT CONTRAST TECHNIQUE: Multiplanar, multiecho pulse sequences of the brain and surrounding structures were obtained without intravenous contrast. COMPARISON:  Same-day CT head FINDINGS: Incomplete study due to patient tolerance. Motion degraded axial FLAIR, axial SWI, and axial and coronal DWI sequences were obtained. 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 There is no associated hemorrhage or mass effect. There is no acute intracranial hemorrhage, extra-axial fluid collection, or acute infarct. There is unchanged background parenchymal volume loss with prominence of the ventricular system and extra-axial CSF spaces.  The ventricles are similar in size to 2017. There is extensive confluent FLAIR signal abnormality in the supratentorial white matter consistent with advanced underlying chronic small-vessel ischemic change. 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 There is no mass lesion.  There is no mass effect or midline shift. Vascular: Not well assessed on the current study. Skull and upper cervical spine: Grossly unremarkable on the provided sequences.  Sinuses/Orbits: The paranasal sinuses are clear. The globes and orbits are grossly unremarkable. Other: None. IMPRESSION: 1. Incomplete, motion degraded study. 2. Small areas of acute infarct in the right frontal and temporal lobe cortex and caudate body without hemorrhage or mass effect. 3. 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 Electronically Signed   By: Valetta Mole [name]   On: 11/03/2022 19:16   CT HEAD CODE STROKE WO CONTRAST  Result Date: 11/03/2022 CLINICAL DATA:  Left-sided deficit and facial droop. EXAM: CT ANGIOGRAPHY HEAD AND NECK TECHNIQUE: Multidetector CT imaging of the head and neck was performed using the standard protocol during bolus administration of intravenous contrast. Multiplanar CT image reconstructions and MIPs were obtained to evaluate the vascular anatomy. Carotid stenosis measurements (when applicable) are obtained utilizing NASCET criteria, using the distal internal carotid diameter as the denominator. RADIATION DOSE REDUCTION: This exam was performed according to the departmental dose-optimization program which includes automated exposure control, adjustment of the mA and/or kV according to patient size and/or use of iterative reconstruction technique. CONTRAST:  31m OMNIPAQUE IOHEXOL 350 MG/ML SOLN COMPARISON:  Brain MRI 12/30/2015 FINDINGS: CT HEAD FINDINGS Brain: There is no evidence of acute intracranial hemorrhage, extra-axial fluid collection, or acute territorial infarct. ASPECTS is 10. There is moderate background parenchymal volume loss with prominence of the ventricular system and extra-axial CSF spaces. The ventricles are similar in size to 2017. Extensive confluent hypodensity throughout the supratentorial white matter likely reflects sequela of underlying chronic small-vessel ischemic change There is no mass lesion.  There is no mass effect or midline  shift. Vascular: See below. Skull: Normal. Negative for fracture or focal lesion. Sinuses/Orbits: The paranasal sinuses are clear. Bilateral lens implants are in place. The globes and orbits are otherwise unremarkable. Other: None. Review of the MIP images confirms the above findings CTA NECK FINDINGS Aortic arch: The imaged aortic arch is normal. The origins of the left common carotid and subclavian arteries are patent. The brachiocephalic artery origin is not imaged. The subclavian arteries are patent to the level Right carotid system: Imaged. The right common, internal, and external carotid arteries are patent, without hemodynamically significant stenosis or occlusion. There is no dissection or aneurysm. Left carotid system: The left common, internal, and external carotid arteries are patent, without hemodynamically significant stenosis or occlusion. There is no dissection or aneurysm. Vertebral arteries: The vertebral arteries are patent, without hemodynamically significant stenosis or occlusion. There is no dissection or aneurysm. Skeleton: There is no acute osseous abnormality or suspicious osseous lesion. There is no visible canal hematoma. Other neck: The soft tissues of the neck are unremarkable. Upper chest: The imaged lung apices are clear. Review of the MIP images confirms the above findings CTA HEAD FINDINGS Anterior circulation: The intracranial ICAs are patent. There is a right M1 occlusion. An inferior right M2 branch reconstitutes in the sylvian fissure common but there is overall poor collateral flow in the MCA distribution. The left MCA  branches are patent, without proximal stenosis or occlusion. The bilateral ACAs are patent, without proximal stenosis or occlusion. The anterior communicating artery is normal. There is no aneurysm or AVM. Posterior circulation: The bilateral V4 segments are patent. The basilar artery is patent. The major cerebellar arteries appear patent. The bilateral PCAs are  patent, without proximal stenosis or occlusion. Posterior communicating arteries are not definitely seen. There is no aneurysm or AVM. Venous sinuses: As permitted by contrast timing, patent. Anatomic variants: None. Review of the MIP images confirms the above findings IMPRESSION: 1. No acute intracranial pathology on initial noncontrast head CT. ASPECTS is 10. 2. 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 3. Otherwise patent vasculature of the head and neck without other hemodynamically significant stenosis or occlusion. Results of the initial noncontrast head CT were paged via Amion to Dr. Erlinda Hong at 2:35 pm. CTA results were discussed at 2:38 pm. Electronically Signed   By: Valetta Mole [name]   On: 11/03/2022 14:44   CT ANGIO HEAD NECK W WO CM (CODE STROKE)  Result Date: 11/03/2022 CLINICAL DATA:  Left-sided deficit and facial droop. EXAM: CT ANGIOGRAPHY HEAD AND NECK TECHNIQUE: Multidetector CT imaging of the head and neck was performed using the standard protocol during bolus administration of intravenous contrast. Multiplanar CT image reconstructions and MIPs were obtained to evaluate the vascular anatomy. Carotid stenosis measurements (when applicable) are obtained utilizing NASCET criteria, using the distal internal carotid diameter as the denominator. RADIATION DOSE REDUCTION: This exam was performed according to the departmental dose-optimization program which includes automated exposure control, adjustment of the mA and/or kV according to patient size and/or use of iterative reconstruction technique. CONTRAST:  56m OMNIPAQUE IOHEXOL 350 MG/ML SOLN COMPARISON:  Brain MRI 12/30/2015 FINDINGS: CT HEAD FINDINGS Brain: There is no evidence of acute intracranial hemorrhage, extra-axial fluid collection, or acute territorial infarct. ASPECTS is 10. There is moderate background parenchymal volume loss with prominence of the ventricular system and extra-axial CSF spaces. The ventricles are  similar in size to 2017. Extensive confluent hypodensity throughout the supratentorial white matter likely reflects sequela of underlying chronic small-vessel ischemic change There is no mass lesion.  There is no mass effect or midline shift. Vascular: See below. Skull: Normal. Negative for fracture or focal lesion. Sinuses/Orbits: The paranasal sinuses are clear. Bilateral lens implants are in place. The globes and orbits are otherwise unremarkable. Other: None. Review of the MIP images confirms the above findings CTA NECK FINDINGS Aortic arch: The imaged aortic arch is normal. The origins of the left common carotid and subclavian arteries are patent. The brachiocephalic artery origin is not imaged. The subclavian arteries are patent to the level Right carotid system: Imaged. The right common, internal, and external carotid arteries are patent, without hemodynamically significant stenosis or occlusion. There is no dissection or aneurysm. Left carotid system: The left common, internal, and external carotid arteries are patent, without hemodynamically significant stenosis or occlusion. There is no dissection or aneurysm. Vertebral arteries: The vertebral arteries are patent, without hemodynamically significant stenosis or occlusion. There is no dissection or aneurysm. Skeleton: There is no acute osseous abnormality or suspicious osseous lesion. There is no visible canal hematoma. Other neck: The soft tissues of the neck are unremarkable. Upper chest: The imaged lung apices are clear. Review of the MIP images confirms the above findings CTA HEAD FINDINGS Anterior circulation: The intracranial ICAs are patent. There is a right M1 occlusion. An inferior right M2 branch reconstitutes in the sylvian  fissure common but there is overall poor collateral flow in the MCA distribution. The left MCA branches are patent, without proximal stenosis or occlusion. The bilateral ACAs are patent, without proximal stenosis or occlusion.  The anterior communicating artery is normal. There is no aneurysm or AVM. Posterior circulation: The bilateral V4 segments are patent. The basilar artery is patent. The major cerebellar arteries appear patent. The bilateral PCAs are patent, without proximal stenosis or occlusion. Posterior communicating arteries are not definitely seen. There is no aneurysm or AVM. Venous sinuses: As permitted by contrast timing, patent. Anatomic variants: None. Review of the MIP images confirms the above findings IMPRESSION: 1. No acute intracranial pathology on initial noncontrast head CT. ASPECTS is 10. 2. 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 3. Otherwise patent vasculature of the head and neck without other hemodynamically significant stenosis or occlusion. Results of the initial noncontrast head CT were paged via Amion to Dr. Erlinda Hong at 2:35 pm. CTA results were discussed at 2:38 pm. Electronically Signed   By: Valetta Mole [name]   On: 11/03/2022 14:44   DG CHEST PORT 1 VIEW  Result Date: 10/31/2022 CLINICAL DATA:  Shortness of breath EXAM: PORTABLE CHEST 1 VIEW COMPARISON:  Chest x-ray dated October 31, 2022 FINDINGS: Cardiac and mediastinal contours are unchanged. Increased bilateral interstitial opacities. Similar right basilar opacity. Probable new small bilateral pleural effusions. No evidence of pneumothorax IMPRESSION: 1. Slightly increased bilateral interstitial opacities, likely due to worsened pulmonary edema. 2. Similar right basilar opacity which may be due to infection or aspiration. 3. Probable new small bilateral pleural effusions. Electronically Signed   By: Yetta Glassman [name]   On: 10/31/2022 15:57   DG CHEST PORT 1 VIEW  Result Date: 10/31/2022 CLINICAL DATA:  Post thoracentesis yesterday, shortness of breath, atrial fibrillation EXAM: PORTABLE CHEST 1 VIEW COMPARISON:  Portable exam 0606 hours compared to 10/30/2022 FINDINGS: Upper normal heart size. Mediastinal contours  and pulmonary vascularity normal. Bibasilar atelectasis. Patchy opacity RIGHT base question infiltrate, little changed. Upper lungs clear. No pleural effusion or pneumothorax. 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 IMPRESSION: Bibasilar atelectasis and probable RIGHT basilar infiltrate. Electronically Signed   By: Lavonia Dana [name]   On: 10/31/2022 08:30   DG CHEST PORT 1 VIEW  Result Date: 10/30/2022 CLINICAL DATA:  Congestive heart failure status post thoracentesis EXAM: PORTABLE CHEST 1 VIEW COMPARISON:  Chest radiograph dated 10/29/2022, CTA chest dated 10/29/2022 FINDINGS: Normal lung volumes. Persistent confluence right lower lung opacity. Patchy left basilar opacity. No definite pleural effusion questionable trace right apical pneumothorax. The heart size and mediastinal contours are within normal limits. The visualized skeletal structures are unremarkable. IMPRESSION: 1. Persistent confluence right lower lung opacity, suspicious for pneumonia. 2. Patchy left basilar opacity, likely atelectasis. 3. Questionable trace right apical pneumothorax. No definite pleural effusion. Recommend attention on follow-up. Electronically Signed   By: Darrin Nipper [name]   On: 10/30/2022 15:58   ECHOCARDIOGRAM COMPLETE  Result Date: 10/30/2022    ECHOCARDIOGRAM REPORT   Patient Name:   Leslie [name] Date of Exam: 10/30/2022 Medical Rec #:  [id number]           Height:       67.5 in Accession #:    [id number]          Weight:       171.5 lb Date of Birth:  Jan 08, 1941           BSA:  1.905 m Patient Age:    81 years            BP:           119/92 mmHg Patient Gender: F                   HR:           103 bpm. Exam Location:  Inpatient Procedure: 2D Echo, Color Doppler and Cardiac Doppler Indications:    I48.91* Unspecified atrial fibrillation  History:        Patient has prior history of Echocardiogram examinations, most                 recent 01/10/2016. Arrythmias:Atrial  Fibrillation.  Sonographer:    Raquel Sarna Senior RDCS Referring Phys: [phone] A CALDWELL [place]  1. Left ventricular ejection fraction, by estimation, is 40 to 45%. The left ventricle has mildly decreased function. The left ventricle demonstrates global hypokinesis. There is moderate asymmetric left ventricular hypertrophy of the basal-septal segment. Left ventricular diastolic parameters are indeterminate.  2. Right ventricular systolic function is normal. The right ventricular size is normal. There is normal pulmonary artery systolic pressure.  3. Left atrial size was mildly dilated.  4. The mitral valve is abnormal. 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 No evidence of mitral stenosis.  5. The aortic valve was not well visualized. Aortic valve regurgitation is trivial. No aortic stenosis is present.  6. The inferior vena cava is dilated in size with >50% respiratory variability, suggesting right atrial pressure of 8 mmHg. Comparison(s): MR is worse from prior report. FINDINGS  Left Ventricle: Left ventricular ejection fraction, by estimation, is 40 to 45%. The left ventricle has mildly decreased function. The left ventricle demonstrates global hypokinesis. The left ventricular internal cavity size was normal in size. There is  moderate asymmetric left ventricular hypertrophy of the basal-septal segment. Left ventricular diastolic parameters are indeterminate. Right Ventricle: The right ventricular size is normal. No increase in right ventricular wall thickness. Right ventricular systolic function is normal. There is normal pulmonary artery systolic pressure. The tricuspid regurgitant velocity is 2.72 m/s, and  with an assumed right atrial pressure of 3 mmHg, the estimated right ventricular systolic pressure is 81.4 mmHg. Left Atrium: Left atrial size was mildly dilated. Right Atrium: Right atrial size was normal in size. Pericardium: The pericardium was not well visualized. Mitral Valve: MR  volume 52. The mitral valve is abnormal. Mild mitral annular calcification. 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 No evidence of mitral valve stenosis. Tricuspid Valve: The tricuspid valve is not well visualized. Tricuspid valve regurgitation is not demonstrated. No evidence of tricuspid stenosis. Aortic Valve: The aortic valve was not well visualized. Aortic valve regurgitation is trivial. No aortic stenosis is present. Pulmonic Valve: The pulmonic valve was not well visualized. Pulmonic valve regurgitation is not visualized. No evidence of pulmonic stenosis. Aorta: The aortic root, ascending aorta and aortic arch are all structurally normal, with no evidence of dilitation or obstruction. Venous: The inferior vena cava is dilated in size with greater than 50% respiratory variability, suggesting right atrial pressure of 8 mmHg. IAS/Shunts: No atrial level shunt detected by color flow Doppler.  LEFT VENTRICLE PLAX 2D LVIDd:         4.20 cm LVIDs:         3.50 cm LV PW:         0.90 cm LV IVS:        1.25 cm LVOT  diam:     2.10 cm LV SV:         40 LV SV Index:   21 LVOT Area:     3.46 cm  RIGHT VENTRICLE RV S prime:     10.30 cm/s TAPSE (M-mode): 1.3 cm LEFT ATRIUM             Index        RIGHT ATRIUM           Index LA diam:        3.80 cm 1.99 cm/m   RA Area:     13.50 cm LA Vol (A2C):   65.3 ml 34.28 ml/m  RA Volume:   31.30 ml  16.43 ml/m LA Vol (A4C):   76.4 ml 40.11 ml/m LA Biplane Vol: 75.7 ml 39.74 ml/m  AORTIC VALVE LVOT Vmax:   74.00 cm/s LVOT Vmean:  48.700 cm/s LVOT VTI:    0.116 m  AORTA Ao Root diam: 3.30 cm Ao Asc diam:  3.10 cm MR Peak grad:    84.6 mmHg    TRICUSPID VALVE MR Mean grad:    60.0 mmHg    TR Peak grad:   29.6 mmHg MR Vmax:         460.00 cm/s  TR Vmax:        272.00 cm/s MR Vmean:        370.5 cm/s MR PISA:         4.02 cm     SHUNTS MR PISA Eff ROA: 28 mm       Systemic VTI:  0.12 m MR PISA Radius:  0.80 cm      Systemic Diam: 2.10 cm Rudean Haskell MD  Electronically signed by Rudean Haskell MD Signature Date/Time: 10/30/2022/10:54:26 AM    Final    CT Angio Chest PE W and/or Wo Contrast  Result Date: 10/29/2022 CLINICAL DATA:  Pulmonary embolism (PE) suspected, low to intermediate prob, positive D-dimer EXAM: CT ANGIOGRAPHY CHEST WITH CONTRAST TECHNIQUE: Multidetector CT imaging of the chest was performed using the standard protocol during bolus administration of intravenous contrast. Multiplanar CT image reconstructions and MIPs were obtained to evaluate the vascular anatomy. RADIATION DOSE REDUCTION: This exam was performed according to the departmental dose-optimization program which includes automated exposure control, adjustment of the mA and/or kV according to patient size and/or use of iterative reconstruction technique. CONTRAST:  76m OMNIPAQUE IOHEXOL 350 MG/ML SOLN COMPARISON:  None Available. FINDINGS: Cardiovascular: Heart is normal size. Aorta is normal caliber. Scattered coronary artery and aortic calcifications. No filling defects in the pulmonary arteries to suggest pulmonary emboli. Mediastinum/Nodes: Mildly prominent mediastinal and bilateral hilar lymph nodes. No axillary adenopathy. Trachea and esophagus are unremarkable. Lungs/Pleura: Moderate right pleural effusion. Airspace disease in the right lower lobe compatible with pneumonia. Clustered tree-in-bud nodular densities in the right upper lobe also likely infectious. No confluent opacity on the left. Upper Abdomen: No acute findings Musculoskeletal: Chest wall soft tissues are unremarkable. No acute bony abnormality. Review of the MIP images confirms the above findings. IMPRESSION: Right lower lobe consolidation with moderate right pleural effusion. Tree-in-bud nodular densities in the right upper lobe. Findings compatible with pneumonia. No evidence of pulmonary embolus. Coronary artery disease. Aortic Atherosclerosis (ICD10-I70.0). Electronically Signed   By: KRolm Baptise [name]   On: 10/29/2022 23:43   DG Chest Portable 1 View  Result Date: 10/29/2022 CLINICAL DATA:  Atrial fibrillation, palpitations EXAM: PORTABLE CHEST 1 VIEW COMPARISON:  10/23/2022 FINDINGS: Transverse diameter of heart  is slightly increased. There are no signs of alveolar pulmonary edema. There is interval decrease in patchy infiltrates in right parahilar region and right lower lung field. New small patchy infiltrate is seen in the lateral aspect of left lower lung field. There is no pleural effusion or pneumothorax. IMPRESSION: There is interval decrease in patchy infiltrates in right mid and right lower lung fields suggesting resolving atelectasis/pneumonia. There is new small patchy infiltrate in left lower lung fields suggesting multifocal pneumonia. Electronically Signed   By: Elmer Picker [name]   On: 10/29/2022 16:37   DG Chest 2 View  Result Date: 10/25/2022 CLINICAL DATA:  Cough and shortness of breath. Recent diagnosis of pneumonia. EXAM: CHEST - 2 VIEW COMPARISON:  PA and lateral chest 10/05/2022. FINDINGS: Patchy airspace disease in the right chest, worst lung base has progressed since the prior study. The left lung remains clear. Heart size is normal. No pneumothorax or pleural fluid. No acute bony abnormality. IMPRESSION: Worsened airspace disease in the right chest most consistent with progressive pneumonia. Electronically Signed   By: Inge Rise [name]   On: 10/25/2022 07:50     PHYSICAL EXAM  Temp:  [96.7 F (35.9 C)-98 F (36.7 C)] 97.7 F (36.5 C) (11/27 1600) Pulse Rate:  [72-139] 95 (11/27 1700) Resp:  [13-25] 15 (11/27 1700) BP: (84-149)/(54-132) 101/89 (11/27 1700) SpO2:  [91 %-100 %] 93 % (11/27 1700)  General - Well nourished, well developed, in no apparent distress.  Ophthalmologic - fundi not visualized due to noncooperation.  Cardiovascular - irregularly irregular heart rate and rhythm.  Neuro - awake, alert, eyes open, orientated to age, place,  time and people. No aphasia, fluent language, following all simple commands, mild to moderate dysarthria. Able to name and repeat. 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 Left mild facial droop, left palpebral fissure wider and left eye closure weaker than right. Tongue midline. RUE and RLE 5/5. LLE 5-/5, LUE 4+/5 proximal and bicep, 4/5 tricep and finger grip. Sensation decreased on the left face and UE and diminished on the LLE, b/l FTN intact, gait not tested.     ASSESSMENT/PLAN Leslie [name] is a 81 y.o. female with history of recently diagnosed afib RVR admitted for afib management. Put on eliquis but developed acute onset left facial droop, left sided weakness, left neglect and right gaze preference with moderate dysarthria. No tPA given due to on eliquis.    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 MRI  Small areas of acute infarct in the right frontal and temporal lobe cortex and caudate body without hemorrhage or mass effect. S/p IR with TICI3 reperfusion 2D Echo  EF 40-45% LDL 86 HgbA1c 6.2 eliquis for VTE prophylaxis Eliquis (apixaban) daily prior to admission, now on Eliquis (apixaban) daily given small size of infarcts Patient counseled to be compliant with her antithrombotic medications Ongoing aggressive stroke risk factor management Therapy recommendations:  CIR Disposition:  pending  Afib RVR Recent diagnosis of Afib  Rate controlled Cardiology on board Off amiodarone and on cardizem Resumed eliquis '5mg'$  bid  BP management Stable Long term BP goal normotensive  Hyperlipidemia Home meds:  none  LDL 86, goal < 70 Now on crestor 10 No high intensity statin given LDL near  goal and advanced age Continue statin  at discharge  Other Stroke Risk Factors Advanced age  Other Active Problems Leukocytosis WBC 14.4->17.0->14.3->11.0->14.5 AKI Cre 0.86->1.19  Hospital day # 6  This patient is critically ill due to afib RVR, acute stroke needing thrombectomy and at significant risk of neurological worsening, death form recurrent stroke, hemorrhagic conversion, heart failure. This patient's care requires constant monitoring of vital signs, hemodynamics, respiratory and cardiac monitoring, review of multiple databases, neurological assessment, discussion with family, other specialists and medical decision making of high complexity. I spent 40 minutes of neurocritical care time in the care of this patient. I had long discussion with pt and son in law at bedside, updated pt current condition, treatment plan and potential prognosis, and answered all the questions. They expressed understanding and appreciation.    Rosalin Hawking, MD PhD Stroke Neurology 11/05/2022 5:40 PM    To contact Stroke Continuity provider, please refer to [email or web address] After hours, contact General Neurology

## 2022-11-05 NOTE — Progress Notes (Signed)
  Inpatient Rehabilitation Admissions Coordinator   Met with patient and daughter at bedside for rehab assessment. We discussed goals and expectations of a possible CIR admit. Daughter here from [place]. Family would have to clarify if they could provide 24/7 supervision after a typical 2 week Cir admit . Daughter to discuss and let me know tomorrow. If unable to provide 24/7 supervision after a Cir admit, I would instead recommend SNF rehab until patient could return home alone or arrangements could be made at home. Please call me with any questions.   Danne Baxter, RN, MSN Rehab Admissions Coordinator [phone]

## 2022-11-05 NOTE — Progress Notes (Signed)
Modified Barium Swallow Progress Note  Patient Details  Name: Brittiny Levitz MRN: [id number] Date of Birth: 06/12/1941  Today's Date: 11/05/2022  Modified Barium Swallow completed.  Full report located under Chart Review in the Imaging Section.  Brief recommendations include the following:  Clinical Impression  Pt demonstrates a moderate oral dysphagia due to left lingual and facial weakness. Pt demonstrates moderate left anterior spillage and left buccal residue. Pt struggles with posterior propulsion and swallow initaition. She seems to need a chin tuck to propel bolus, which was spontaneous (un cued) throughout study. Pt has instances of adequate swallow initiation and timing and instances of adequate epiglottic deflection and base of tongue propulsion. There were also instances of delayed swallow and incomplete hyolaryngeal movement. Pt has occasional trace sensed aspiration  events given these deficits. Pt is more consistent with nectar thick liquids. She can attempt mechanical soft solids if there is adequate oral care after meals. Will f/u for tolerance.   Swallow Evaluation Recommendations       SLP Diet Recommendations: Dysphagia 3 (Mech soft) solids;Nectar thick liquid   Liquid Administration via: Cup;Straw   Medication Administration: Whole meds with puree   Supervision: Patient able to self feed   Compensations: Slow rate;Small sips/bites;Follow solids with liquid       Oral Care Recommendations: Oral care before and after PO        Jassiah Viviano, Katherene Ponto 11/05/2022,3:07 PM

## 2022-11-05 NOTE — Plan of Care (Signed)
  Problem: Education: Goal: Knowledge of General Education information will improve Description: Including pain rating scale, medication(s)/side effects and non-pharmacologic comfort measures Outcome: Progressing   Problem: Health Behavior/Discharge Planning: Goal: Ability to manage health-related needs will improve Outcome: Progressing   Problem: Clinical Measurements: Goal: Ability to maintain clinical measurements within normal limits will improve Outcome: Progressing Goal: Will remain free from infection Outcome: Progressing Goal: Diagnostic test results will improve Outcome: Progressing Goal: Respiratory complications will improve Outcome: Progressing Goal: Cardiovascular complication will be avoided Outcome: Progressing   Problem: Activity: Goal: Risk for activity intolerance will decrease Outcome: Progressing   Problem: Nutrition: Goal: Adequate nutrition will be maintained Outcome: Progressing   Problem: Coping: Goal: Level of anxiety will decrease Outcome: Progressing   Problem: Elimination: Goal: Will not experience complications related to bowel motility Outcome: Progressing Goal: Will not experience complications related to urinary retention Outcome: Progressing   Problem: Pain Managment: Goal: General experience of comfort will improve Outcome: Progressing   Problem: Safety: Goal: Ability to remain free from injury will improve Outcome: Progressing   Problem: Skin Integrity: Goal: Risk for impaired skin integrity will decrease Outcome: Progressing   Problem: Education: Goal: Knowledge of disease or condition will improve Outcome: Progressing Goal: Knowledge of secondary prevention will improve (MUST DOCUMENT ALL) Outcome: Progressing Goal: Knowledge of patient specific risk factors will improve Elta [place] N/A or DELETE if not current risk factor) Outcome: Progressing   Problem: Ischemic Stroke/TIA Tissue Perfusion: Goal: Complications of ischemic  stroke/TIA will be minimized Outcome: Progressing   Problem: Coping: Goal: Will verbalize positive feelings about self Outcome: Progressing Goal: Will identify appropriate support needs Outcome: Progressing   Problem: Health Behavior/Discharge Planning: Goal: Ability to manage health-related needs will improve Outcome: Progressing Goal: Goals will be collaboratively established with patient/family Outcome: Progressing   Problem: Self-Care: Goal: Ability to participate in self-care as condition permits will improve Outcome: Progressing Goal: Verbalization of feelings and concerns over difficulty with self-care will improve Outcome: Progressing Goal: Ability to communicate needs accurately will improve Outcome: Progressing   Problem: Nutrition: Goal: Risk of aspiration will decrease Outcome: Progressing Goal: Dietary intake will improve Outcome: Progressing   Problem: Education: Goal: Understanding of CV disease, CV risk reduction, and recovery process will improve Outcome: Progressing Goal: Individualized Educational Video(s) Outcome: Progressing   Problem: Activity: Goal: Ability to return to baseline activity level will improve Outcome: Progressing   Problem: Cardiovascular: Goal: Ability to achieve and maintain adequate cardiovascular perfusion will improve Outcome: Progressing Goal: Vascular access site(s) Level 0-1 will be maintained Outcome: Progressing   Problem: Health Behavior/Discharge Planning: Goal: Ability to safely manage health-related needs after discharge will improve Outcome: Progressing

## 2022-11-05 NOTE — Anesthesia Postprocedure Evaluation (Signed)
Anesthesia Post Note  Patient: Leslie [name]  Procedure(s) Performed: IR WITH ANESTHESIA     Patient location during evaluation: PACU Anesthesia Type: General Level of consciousness: awake and alert Pain management: pain level controlled Vital Signs Assessment: post-procedure vital signs reviewed and stable Respiratory status: spontaneous breathing, nonlabored ventilation, respiratory function stable and patient connected to nasal cannula oxygen Cardiovascular status: blood pressure returned to baseline and stable Postop Assessment: no apparent nausea or vomiting Anesthetic complications: no   No notable events documented.  Last Vitals:  Vitals:   11/05/22 0800 11/05/22 0900  BP: 108/85   Pulse: 100 84  Resp: 16 20  Temp: 36.4 C   SpO2: 96% 96%    Last Pain:  Vitals:   11/05/22 0800  TempSrc: Oral  PainSc: 0-No pain                 Mahin Guardia S

## 2022-11-06 DIAGNOSIS — I63411 Cerebral infarction due to embolism of right middle cerebral artery: Secondary | ICD-10-CM | POA: Diagnosis not present

## 2022-11-06 DIAGNOSIS — I6601 Occlusion and stenosis of right middle cerebral artery: Secondary | ICD-10-CM | POA: Diagnosis not present

## 2022-11-06 DIAGNOSIS — J90 Pleural effusion, not elsewhere classified: Secondary | ICD-10-CM | POA: Diagnosis not present

## 2022-11-06 DIAGNOSIS — J189 Pneumonia, unspecified organism: Secondary | ICD-10-CM | POA: Diagnosis not present

## 2022-11-06 DIAGNOSIS — I4891 Unspecified atrial fibrillation: Secondary | ICD-10-CM | POA: Diagnosis not present

## 2022-11-06 LAB — CBC
HCT: 36.5 % (ref 36.0–46.0)
Hemoglobin: 11.8 g/dL — ABNORMAL LOW (ref 12.0–15.0)
MCH: 28.6 pg (ref 26.0–34.0)
MCHC: 32.3 g/dL (ref 30.0–36.0)
MCV: 88.4 fL (ref 80.0–100.0)
Platelets: 272 10*3/uL (ref 150–400)
RBC: 4.13 MIL/uL (ref 3.87–5.11)
RDW: 15.6 % — ABNORMAL HIGH (ref 11.5–15.5)
WBC: 12.9 10*3/uL — ABNORMAL HIGH (ref 4.0–10.5)
nRBC: 0 % (ref 0.0–0.2)

## 2022-11-06 LAB — BASIC METABOLIC PANEL
Anion gap: 10 (ref 5–15)
BUN: 18 mg/dL (ref 8–23)
CO2: 26 mmol/L (ref 22–32)
Calcium: 8.8 mg/dL — ABNORMAL LOW (ref 8.9–10.3)
Chloride: 102 mmol/L (ref 98–111)
Creatinine, Ser: 1.1 mg/dL — ABNORMAL HIGH (ref 0.44–1.00)
GFR, Estimated: 51 mL/min — ABNORMAL LOW (ref >60)
Glucose, Bld: 110 mg/dL — ABNORMAL HIGH (ref 70–99)
Potassium: 3.9 mmol/L (ref 3.5–5.1)
Sodium: 138 mmol/L (ref 135–145)

## 2022-11-06 MED ORDER — ACETAMINOPHEN 325 MG PO TABS
650.0000 mg | ORAL_TABLET | ORAL | Status: DC | PRN
  Administered 2022-11-06: 650 mg via ORAL
  Filled 2022-11-06: qty 2

## 2022-11-06 MED ORDER — SENNOSIDES-DOCUSATE SODIUM 8.6-50 MG PO TABS
1.0000 | ORAL_TABLET | Freq: Two times a day (BID) | ORAL | Status: DC
  Administered 2022-11-06 – 2022-11-08 (×4): 1 via ORAL
  Filled 2022-11-06 (×4): qty 1

## 2022-11-06 MED ORDER — POLYETHYLENE GLYCOL 3350 17 G PO PACK: 17.0000 g | PACK | Freq: Every day | ORAL | Status: DC | PRN

## 2022-11-06 NOTE — Progress Notes (Signed)
Cardiology Progress Note  Patient ID: Leslie [name] MRN: [id number] DOB: [birth year] Date of Encounter: 11/06/2022  Primary Cardiologist: Peter Martinique, MD  Subjective   Chief Complaint: None.  HPI: A-fib well controlled.  Denies symptoms.  ROS:  All other ROS reviewed and negative. Pertinent positives noted in the HPI.     Inpatient Medications  Scheduled Meds:  apixaban  5 mg Oral BID   Chlorhexidine Gluconate Cloth  6 each Topical Daily   vitamin B-12  1,000 mcg Oral Daily   diltiazem  360 mg Oral Daily   famotidine  20 mg Oral Daily   furosemide  40 mg Oral Daily   levothyroxine  88 mcg Oral Q0600   mouth rinse  15 mL Mouth Rinse 4 times per day   rosuvastatin  10 mg Oral Daily   Continuous Infusions:  sodium chloride 50 mL/hr at 11/06/22 0800   PRN Meds: calcium carbonate, guaiFENesin, ipratropium-albuterol, melatonin, metoprolol tartrate, ondansetron, mouth rinse   Vital Signs   Vitals:   11/06/22 0600 11/06/22 0700 11/06/22 0800 11/06/22 0900  BP: 99/71 100/75 128/80 102/71  Pulse: 74 97 99 96  Resp: '18 16 17 '$ (!) 24  Temp:   97.6 F (36.4 C)   TempSrc:   Oral   SpO2: 96% 98% 96% 96%  Weight:      Height:        Intake/Output Summary (Last 24 hours) at 11/06/2022 1021 Last data filed at 11/06/2022 0800 Gross per 24 hour  Intake 1043.61 ml  Output 700 ml  Net 343.61 ml      11/04/2022    4:00 AM 11/03/2022    9:00 PM 11/03/2022    5:04 AM  Last 3 Weights  Weight (lbs) 141 lb 5 oz 141 lb 5 oz 164 lb 10.9 oz  Weight (kg) 64.1 kg 64.1 kg 74.7 kg      Telemetry  Overnight telemetry shows A-fib 90 to 110 bpm, which I personally reviewed.   Physical Exam   Vitals:   11/06/22 0600 11/06/22 0700 11/06/22 0800 11/06/22 0900  BP: 99/71 100/75 128/80 102/71  Pulse: 74 97 99 96  Resp: '18 16 17 '$ (!) 24  Temp:   97.6 F (36.4 C)   TempSrc:   Oral   SpO2: 96% 98% 96% 96%  Weight:      Height:        Intake/Output Summary (Last 24 hours) at  11/06/2022 1021 Last data filed at 11/06/2022 0800 Gross per 24 hour  Intake 1043.61 ml  Output 700 ml  Net 343.61 ml       11/04/2022    4:00 AM 11/03/2022    9:00 PM 11/03/2022    5:04 AM  Last 3 Weights  Weight (lbs) 141 lb 5 oz 141 lb 5 oz 164 lb 10.9 oz  Weight (kg) 64.1 kg 64.1 kg 74.7 kg    Body mass index is 21.81 kg/m.  General: Well nourished, well developed, in no acute distress Head: Atraumatic, normal size  Eyes: PEERLA, EOMI  Neck: Supple, no JVD Endocrine: No thryomegaly Cardiac: Normal S1, S2; irregular rhythm Lungs: Clear to auscultation bilaterally, no wheezing, rhonchi or rales  Abd: Soft, nontender, no hepatomegaly  Ext: No edema, pulses 2+ Musculoskeletal: No deformities Skin: Warm and dry, no rashes   Neuro: Left-sided facial droop noted, weakness in the left extremities  Labs  High Sensitivity Troponin:   Recent Labs  Lab 10/29/22 1552 10/29/22 2018 10/31/22 1540 10/31/22 1736  TROPONINIHS  $'13 11 12 14     'T$ Cardiac EnzymesNo results for input(s): "TROPONINI" in the last 168 hours. No results for input(s): "TROPIPOC" in the last 168 hours.  Chemistry Recent Labs  Lab 10/31/22 0108 11/01/22 0038 11/02/22 0030 11/04/22 0422 11/05/22 0531 11/06/22 0343  NA 139 137   < > 136 139 138  K 4.3 3.7   < > 4.2 4.1 3.9  CL 109 101   < > 103 101 102  CO2 17* 25   < > '24 28 26  '$ GLUCOSE 134* 151*   < > 173* 121* 110*  BUN 29* 32*   < > '16 15 18  '$ CREATININE 1.07* 1.26*   < > 0.86 1.19* 1.10*  CALCIUM 9.0 8.8*   < > 8.5* 8.9 8.8*  PROT 6.6 6.7  --   --   --   --   ALBUMIN 3.1* 3.1*  --   --   --   --   AST 47* 36  --   --   --   --   ALT 64* 63*  --   --   --   --   ALKPHOS 89 84  --   --   --   --   BILITOT 0.4 0.4  --   --   --   --   GFRNONAA 53* 43*   < > >60 46* 51*  ANIONGAP 13 11   < > '9 10 10   '$ < > = values in this interval not displayed.    Hematology Recent Labs  Lab 11/04/22 0422 11/05/22 0531 11/06/22 0343  WBC 11.0* 14.5* 12.9*   RBC 4.10 4.24 4.13  HGB 11.3* 12.0 11.8*  HCT 36.4 37.3 36.5  MCV 88.8 88.0 88.4  MCH 27.6 28.3 28.6  MCHC 31.0 32.2 32.3  RDW 15.7* 15.6* 15.6*  PLT 338 302 272   BNP Recent Labs  Lab 10/31/22 1540 11/01/22 0038  BNP 733.7* 791.8*    DDimer No results for input(s): "DDIMER" in the last 168 hours.   Radiology  No results found.  Cardiac Studies  TTE 10/30/2022  1. Left ventricular ejection fraction, by estimation, is 40 to 45%. The  left ventricle has mildly decreased function. The left ventricle  demonstrates global hypokinesis. There is moderate asymmetric left  ventricular hypertrophy of the basal-septal  segment. Left ventricular diastolic parameters are indeterminate.   2. Right ventricular systolic function is normal. The right ventricular  size is normal. There is normal pulmonary artery systolic pressure.   3. Left atrial size was mildly dilated.   4. The mitral valve is abnormal. Moderate to severe mitral valve  regurgitation. No evidence of mitral stenosis.   5. The aortic valve was not well visualized. Aortic valve regurgitation  is trivial. No aortic stenosis is present.   6. The inferior vena cava is dilated in size with >50% respiratory  variability, suggesting right atrial pressure of 8 mmHg.   Patient Profile  Leslie [name] is a 81 y.o. female with hypothyroidism, TIA, GERD who was admitted on 10/30/2022 with pneumonia and new onset atrial fibrillation.  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   Assessment & Plan   #Persistent atrial fibrillation/flutter -Currently rate controlled.  Was initially admitted with pneumonia.  Has suffered a stroke.  Plan is for rate control consideration for outpatient cardioversion. -Ejection fraction close to 50%. -Continue diltiazem during her 60 mg daily. -Continue Eliquis  5 mg twice daily. -She should continue with stroke rehab.  Plans for outpatient  follow-up in 4 to 6 weeks.  #Acute systolic heart failure, EF 40-45% -Secondary to A-fib. -No signs of heart failure. -EF closer to 50%. -Okay for Cardizem.  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  Likely will improve with restoration of sinus rhythm.  This will occur in several weeks due to recent stroke.  #Right MCA stroke -Secondary to A-fib.  Status post mechanical thrombectomy.  Now on Eliquis. -We will plan for her to recover from her stroke and then pursue outpatient cardioversion.  We will arrange hospital follow-up.  [place] will sign off.   Medication Recommendations: Medications as above Other recommendations (labs, testing, etc): None Follow up as an outpatient: Follow-up with Dr. Martinique in 4 to 6 weeks  For questions or updates, please contact [place] Please consult [email or web address] for contact info under        Signed, Lake Bells T. Audie Box, MD, [place]  11/06/2022 10:21 AM

## 2022-11-06 NOTE — Progress Notes (Signed)
Speech Language Pathology Treatment: Dysphagia;Cognitive-Linquistic  Patient Details Name: Leslie [name] MRN: [id number] DOB: 1941/04/05 Today's Date: 11/06/2022 Time: 8341-9622 SLP Time Calculation (min) (ACUTE ONLY): 38 min  Assessment / Plan / Recommendation Clinical Impression  Pt was seated in recliner eating lunch with son-in-law, Liane Comber, at bedside.  Session focused on swallowing, attention to left side, using tongue to remove left residue, and using mirror on rolling table to improve awareness of spillage from left side of mouth. Pt required mod verbal/visual cues to prevent/remove spillage. Cognition marked by reduced insight/awareness of deficits in general.  Cues needed for intellectual awareness (identifying deficits s/p s stroke).  Reviewed nature of right brain stroke and potential impact with pt and [place]. SLP will follow while on acute care.  HPI HPI: Leslie [name] is an 81 y.o. Caucasian female with PMH of hypothyroidism and GERD who was originally admitted for community-acquired pneumonia and atrial fibrillation with RVR.  Patient was recently started on Eliquis, and pneumonia was improving with antibiotics.  While admitted she went to the restroom and developed sudden onset right gaze deviation, left-sided hemiparesis and left-sided neglect.  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  Patient was taken to IR for mechanical thrombectomy. MRI shows Small areas of acute infarct in the right frontal and temporal  lobe cortex and caudate body without hemorrhage or mass effect.      SLP Plan  Continue with current plan of care      Recommendations for follow up therapy are one component of a multi-disciplinary discharge planning process, led by the attending physician.  Recommendations may be updated based on patient status, additional functional criteria and insurance authorization.    Recommendations  Diet recommendations:  Dysphagia 3 (mechanical soft);Nectar-thick liquid Liquids provided via: Cup;Straw Medication Administration: Crushed with puree Supervision: Patient able to self feed Compensations: Slow rate;Small sips/bites;Follow solids with liquid                Oral Care Recommendations: Oral care BID Follow Up Recommendations: Acute inpatient rehab (3hours/day) SLP Visit Diagnosis: Dysphagia, oropharyngeal phase (R13.12) Plan: Continue with current plan of care         [name], MA CCC/SLP Clinical Specialist - Acute Care SLP Acute Rehabilitation Services Office number [phone]   [name]  11/06/2022, 1:31 PM

## 2022-11-06 NOTE — PMR Pre-admission (Signed)
PMR Admission Coordinator Pre-Admission Assessment  Patient: Leslie [name] is an 81 y.o., female MRN: [id number] DOB: Sep 04, 1941 Height: 5' 7.5" (171.5 cm) Weight: 64.1 kg  Insurance Information HMO: yes    PPO:      PCP:      IPA:      80/20:      OTHER:  PRIMARY: [employer]      Policy#: [id number]      Subscriber: pt CM Name: Lavell Luster      Phone#: [phone] ext [phone]     Fax#: [phone] ( note different fax) Pre-Cert#: [id number]   approved until 12/7   Employer:  Benefits:  Phone #: [phone]     Name: 11/28 Eff. Date: 12/10/21     Deduct: none      Out of Pocket Max: $3400       CIR: $295 co pay per day days 1 until 6      SNF: no copay per day days 1 until 20; $196 co pay per day days 21 until 100 Outpatient: $10 to $20 per visit     Co-Pay: visits per medical neccesity Home Health: 100%      Co-Pay: visits per medical neccesity DME: 80%     Co-Pay: 20% Providers: in network  SECONDARY: none     Policy#:      Phone#:   Development worker, community:       Phone#:   The Engineer, petroleum" for patients in Inpatient Rehabilitation Facilities with attached "Privacy Act [place] Records" was provided and verbally reviewed with: Patient and Family  Emergency Contact Information Contact Information     Name Relation Home Work [place] Daughter [phone]  [phone]   Assunta Gambles [phone]  [phone]      Current Medical History  Patient Admitting Diagnosis: CVA  History of Present Illness: 81 year old female with history of hypothyroidism and GERD. Recently had respiratory issues from a blocked up bathroom . Her PCP started her on po steroids and antibiotics. She developed palpitations. She returned to her PCP where an EKG revealed afib with RVR and she presented to ED on 10/29/22.   Placed on Eliquis but developed acute onset left facial droop, left sided weakness, left neglect and right gaze preference  with moderate dysarthria. MRI revealed small area of acute infarct in the right frontal and temporal lobe cortex and caudate body without hemorrhage or mass effect. 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 2 D echo with EF 40 tp 45%. New diagnosis of Afib with cardiology consulted. Off amiodarone and on Cardizem. Rate controlled. LDL 86 and began Crestor.   Complete NIHSS TOTAL: 3  Patient's medical record from [facility] has been reviewed by the rehabilitation admission coordinator and physician.  Past Medical History  Past Medical History:  Diagnosis Date   Arthritis    Chronic kidney disease    "stage 3 kidney failure" improving per pt   Headache    Hypothyroidism    Insomnia    Plantar fasciitis    Stomach ulcer ~2000   Stroke [facility])    TIA X2   2001, jan 2017   Vitamin D deficiency    Has the patient had major surgery during 100 days prior to admission? No  Family History   family history includes Arthritis in her brother; Congestive Heart Failure in her sister; Diabetes in her sister; Hypertension in her mother and  sister; Lung cancer in her father and mother.  Current Medications  Current Facility-Administered Medications:    0.9 %  sodium chloride infusion, , Intravenous, Continuous, Rosalin Hawking, MD, Last Rate: 50 mL/hr at 11/06/22 0800, Infusion Verify at 11/06/22 0800   apixaban (ELIQUIS) tablet 5 mg, 5 mg, Oral, BID, Wise, Nason S, RPH, 5 mg at 11/06/22 0930   calcium carbonate (TUMS - dosed in mg elemental calcium) chewable tablet 200 mg of elemental calcium, 1 tablet, Oral, TID PRN, Rosalin Hawking, MD, 200 mg of elemental calcium at 11/04/22 1942   Chlorhexidine Gluconate Cloth 2 % PADS 6 each, 6 each, Topical, Daily, Rosalin Hawking, MD, 6 each at 11/06/22 0930   cyanocobalamin (VITAMIN B12) tablet 1,000 mcg, 1,000 mcg, Oral, Daily, Elodia Florence., MD, 1,000 mcg at 11/06/22 0930   diltiazem (CARDIZEM CD) 24 hr  capsule 360 mg, 360 mg, Oral, Daily, Elodia Florence., MD, 360 mg at 11/06/22 1036   famotidine (PEPCID) tablet 20 mg, 20 mg, Oral, Daily, Rosalin Hawking, MD, 20 mg at 11/06/22 0930   furosemide (LASIX) tablet 40 mg, 40 mg, Oral, Daily, Elodia Florence., MD, 40 mg at 11/06/22 0930   guaiFENesin (ROBITUSSIN) 100 MG/5ML liquid 5 mL, 5 mL, Oral, Q4H PRN, Crosley, Debby, MD   ipratropium-albuterol (DUONEB) 0.5-2.5 (3) MG/3ML nebulizer solution 3 mL, 3 mL, Nebulization, Q4H PRN, Crosley, Debby, MD   levothyroxine (SYNTHROID) tablet 88 mcg, 88 mcg, Oral, Q0600, Elodia Florence., MD, 88 mcg at 11/06/22 0659   melatonin tablet 5 mg, 5 mg, Oral, QHS PRN, Irene Pap N, DO, 5 mg at 11/02/22 2252   ondansetron (ZOFRAN) tablet 4 mg, 4 mg, Oral, Q8H PRN, Elodia Florence., MD, 4 mg at 11/06/22 1241   Oral care mouth rinse, 15 mL, Mouth Rinse, 4 times per day, Spero Geralds, MD, 15 mL at 11/06/22 1130   Oral care mouth rinse, 15 mL, Mouth Rinse, PRN, Spero Geralds, MD   rosuvastatin (CRESTOR) tablet 10 mg, 10 mg, Oral, Daily, Rosalin Hawking, MD, 10 mg at 11/06/22 0930  Patients Current Diet:  Diet Order             DIET DYS 3 Room service appropriate? Yes; Fluid consistency: Nectar Thick  Diet effective now                  Precautions / Restrictions Precautions Precautions: Fall Restrictions Weight Bearing Restrictions: No   Has the patient had 2 or more falls or a fall with injury in the past year? No  Prior Activity Level Limited Community (1-2x/wk): Mod I  Prior Functional Level Self Care: Did the patient need help bathing, dressing, using the toilet or eating? Independent  Indoor Mobility: Did the patient need assistance with walking from room to room (with or without device)? Independent  Stairs: Did the patient need assistance with internal or external stairs (with or without device)? Independent  Functional Cognition: Did the patient need help planning  regular tasks such as shopping or remembering to take medications? Independent  Patient Information Are you of Hispanic, Latino/a,or Spanish origin?: A. No, not of Hispanic, Latino/a, or Spanish origin What is your race?: A. White Do you need or want an interpreter to communicate with a doctor or health care staff?: 0. No  Patient's Response To:  Health Literacy and Transportation Is the patient able to respond to health literacy and transportation needs?: Yes Health Literacy - How often do you  need to have someone help you when you read instructions, pamphlets, or other written material from your doctor or pharmacy?: Never In the past 12 months, has lack of transportation kept you from medical appointments or from getting medications?: No In the past 12 months, has lack of transportation kept you from meetings, work, or from getting things needed for daily living?: No  Home Assistive Devices / Equipment Home Equipment: Conservation officer, nature (2 wheels), [employer] - single point, Control and instrumentation engineer Use: Indicate devices/aids used by the patient prior to current illness, exacerbation or injury? None of the above  Current Functional Level Cognition  Arousal/Alertness: Awake/alert Overall Cognitive Status: Impaired/Different from baseline Current Attention Level: Selective Orientation Level: Oriented X4 General Comments: Difficulty with dual tasking Attention: Focused, Sustained, Selective Focused Attention: Appears intact Sustained Attention: Impaired Sustained Attention Impairment: Verbal complex, Functional complex Memory: Impaired Awareness: Impaired Awareness Impairment: Intellectual impairment, Emergent impairment Problem Solving: Impaired Problem Solving Impairment: Functional basic Executive Function: Self Monitoring Self Monitoring: Impaired Self Monitoring Impairment: Verbal basic, Functional basic    Extremity Assessment (includes Sensation/Coordination)  Upper Extremity  Assessment: LUE deficits/detail LUE Deficits / Details: 3+/5, full ROM, impiared sensory motor coordination. Paraesthesias throughout extremeity LUE Sensation: decreased light touch, decreased proprioception LUE Coordination: decreased gross motor, decreased fine motor  Lower Extremity Assessment: Defer to PT evaluation    ADLs  Overall ADL's : Needs assistance/impaired Eating/Feeding: NPO Grooming: Set up, Sitting Upper Body Bathing: Minimal assistance, Sitting Lower Body Bathing: Minimal assistance, Sit to/from stand Upper Body Dressing : Minimal assistance, Sitting Lower Body Dressing: Minimal assistance, Sit to/from stand Toilet Transfer: Minimal assistance, Ambulation, Rolling walker (2 wheels), BSC/3in1 Toileting- Clothing Manipulation and Hygiene: Minimal assistance, Sitting/lateral lean Functional mobility during ADLs: Minimal assistance, Rollator (4 wheels), Rolling walker (2 wheels) General ADL Comments: 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 Cues for hand placement    Ambulation / Gait / Stairs / Wheelchair Mobility  Ambulation/Gait Ambulation/Gait assistance: Min Web designer (Feet): 120 Feet Assistive device: Rolling walker (2 wheels) Gait Pattern/deviations: Step-through pattern, Decreased step length - right, Decreased stride length, Trunk flexed General Gait Details: Verbal cues for larger step lengths, increased gait speed, minA for steering RW. Tendency to keep neck flexed due to vertigo Gait velocity: decreased Gait velocity interpretation: <1.31 ft/sec, indicative of household ambulator    Posture / Balance  Dynamic Sitting Balance Sitting balance - Comments: indep to adjust bilateral socks sitting EOB; indep with pericare Balance Overall balance assessment: Needs assistance Sitting-balance support: Feet supported Sitting balance-Leahy Scale: Fair Sitting balance - Comments: indep to adjust bilateral socks sitting EOB; indep with pericare Standing balance support: Bilateral upper extremity supported, During functional activity Standing balance-Leahy Scale: Poor Standing balance comment: walker and min guard for static standing    Special needs/care consideration Fall precautions due to decreased safety awareness   Previous Home Environment  Living Arrangements: Alone  Lives With: Alone Available Help at Discharge: Family, Industrial/product designer, Available 24 hours/day (DTr from [place], [place] and neighbor to arrange 24/7 supervision after CIR) Type of Home: House Home Layout: Two level, Able to live on main level with bedroom/bathroom Alternate Level Stairs-Number of  Steps: 4 steps down to main living room w/ rail Home Access: Stairs to enter Entrance Stairs-Rails: Right, Left Entrance Stairs-Number of Steps: 2 Bathroom Shower/Tub: Multimedia programmer: Programmer, systems: Yes Home Care Services: No Additional Comments: Daughter to arrange 24/7 supervision; she is here with her spouse from [facility]  Discharge Living Setting Plans for Discharge Living Setting: Patient's home Type of Home at Discharge: House Discharge Home Layout: Two level, Able to live on main level with bedroom/bathroom (4 steps down to main living room) Discharge Home Access: Stairs to enter Entrance Stairs-Rails: Right, Left Entrance Stairs-Number of Steps: 2 Discharge Bathroom Shower/Tub: Walk-in shower Discharge Bathroom Toilet: Standard Discharge Bathroom Accessibility: Yes How Accessible: Accessible via walker Does the patient have any problems obtaining your medications?: No  Social/Family/Support  Systems Patient Roles: Parent Contact Information: daughter, Pam Anticipated Caregiver: Pam , SIL and neighbor Anticipated Ambulance person Information: see contacts Ability/Limitations of Caregiver: Daughter here from [place] Availability: 24/7 Discharge Plan Discussed with Primary Caregiver: Yes Is Caregiver In Agreement with Plan?: Yes Does Caregiver/Family have Issues with Lodging/Transportation while Pt is in Rehab?: No  Goals Patient/Family Goal for Rehab: Mod I to supervision with PT, OT and SLP Expected length of stay: ELOS 10 to 14 days Pt/Family Agrees to Admission and willing to participate: Yes Program Orientation Provided & Reviewed with Pt/Caregiver Including Roles  & Responsibilities: Yes  Decrease burden of Care through IP rehab Admission: n/a  Possible need for SNF placement upon discharge: Not anticipated  Patient Condition: I have reviewed medical records from [facility], spoken with  patient, daughter, and family member. I met with patient at the bedside for inpatient rehabilitation assessment.  Patient will benefit from ongoing PT, OT, and SLP, can actively participate in 3 hours of therapy a day 5 days of the week, and can make measurable gains during the admission.  Patient will also benefit from the coordinated team approach during an Inpatient Acute Rehabilitation admission.  The patient will receive intensive therapy as well as Rehabilitation physician, nursing, social worker, and care management interventions.  Due to bladder management, bowel management, safety, skin/wound care, disease management, medication administration, pain management, and patient education the patient requires 24 hour a day rehabilitation nursing.  The patient is currently mod assist overall with mobility and basic ADLs.  Discharge setting and therapy post discharge at home with home health is anticipated.  Patient has agreed to participate in the Acute Inpatient  Rehabilitation Program and will admit today.  Preadmission Screen Completed By:  Cleatrice Burke, 11/06/2022 2:30 PM ______________________________________________________________________   Discussed status with Dr. Virgie Dad on 11/08/22 at 1612 and received approval for admission today.  Admission Coordinator:  Cleatrice Burke, RN, time 7517 Date 11/08/22   Assessment/Plan: Diagnosis: Right CVA Does the need for close, 24 hr/day Medical supervision in concert with the patient's rehab needs make it unreasonable for this patient to be served in a less intensive setting? Yes Co-Morbidities requiring supervision/potential complications: CKD, prior stroke, a fib with RVR Due to bladder management, bowel management, safety, skin/wound care, disease management, medication administration, pain management, and patient education, does the patient require 24 hr/day rehab nursing? Yes Does the patient require coordinated care of a physician, rehab nurse, PT, OT, and SLP to address physical and functional deficits in the context of the above medical diagnosis(es)? Yes Addressing deficits in the following areas: balance, endurance, locomotion, strength, transferring, bowel/bladder control, bathing, dressing, feeding, grooming, toileting, cognition, speech, and psychosocial support  Can the patient actively participate in an intensive therapy program of at least 3 hrs of therapy 5 days a week? Yes The potential for patient to make measurable gains while on inpatient rehab is excellent Anticipated functional outcomes upon discharge from inpatient rehab: modified independent and supervision PT, modified independent and supervision OT, modified independent and supervision SLP Estimated rehab length of stay to reach the above functional goals is: 10-14 days Anticipated discharge destination: Home 10. Overall Rehab/Functional Prognosis: excellent   MD Signature: Meredith Staggers, MD, [place] Director Rehabilitation Services 11/08/2022

## 2022-11-06 NOTE — Progress Notes (Addendum)
STROKE TEAM PROGRESS NOTE   SUBJECTIVE (INTERVAL HISTORY) Patient is seen in her room with no family at the bedside.  Left arm strength is improved today, but left facial droop and left facial numbness remain.  Awaiting approval for CIR.   OBJECTIVE Temp:  [97.3 F (36.3 C)-98 F (36.7 C)] 97.3 F (36.3 C) (11/28 1200) Pulse Rate:  [72-109] 81 (11/28 1200) Cardiac Rhythm: Atrial fibrillation (11/28 1200) Resp:  [11-24] 20 (11/28 1200) BP: (86-128)/(53-95) 117/76 (11/28 1200) SpO2:  [92 %-99 %] 95 % (11/28 1200)  Recent Labs  Lab 11/03/22 1357  GLUCAP 149*    Recent Labs  Lab 10/31/22 0108 11/01/22 0038 11/02/22 0030 11/03/22 0047 11/04/22 0422 11/05/22 0531 11/06/22 0343  NA 139 137 142 140 136 139 138  K 4.3 3.7 3.2* 4.3 4.2 4.1 3.9  CL 109 101 101 101 103 101 102  CO2 17* '25 29 29 24 28 26  '$ GLUCOSE 134* 151* 109* 140* 173* 121* 110*  BUN 29* 32* 27* '23 16 15 18  '$ CREATININE 1.07* 1.26* 1.25* 1.25* 0.86 1.19* 1.10*  CALCIUM 9.0 8.8* 9.0 9.3 8.5* 8.9 8.8*  MG 2.2 2.3 2.2 2.5*  --   --   --   PHOS 3.3 3.5 3.9 4.0  --   --   --     Recent Labs  Lab 10/31/22 0108 11/01/22 0038  AST 47* 36  ALT 64* 63*  ALKPHOS 89 84  BILITOT 0.4 0.4  PROT 6.6 6.7  ALBUMIN 3.1* 3.1*    Recent Labs  Lab 10/31/22 0108 11/01/22 0038 11/02/22 0030 11/03/22 0047 11/04/22 0422 11/05/22 0531 11/06/22 0343  WBC 14.4* 17.0* 13.9* 14.3* 11.0* 14.5* 12.9*  NEUTROABS 11.4* 11.7*  --   --  9.5*  --   --   HGB 9.9* 11.3* 12.1 12.5 11.3* 12.0 11.8*  HCT 32.9* 36.2 37.3 39.3 36.4 37.3 36.5  MCV 91.4 88.7 87.4 88.3 88.8 88.0 88.4  PLT 327 328 282 348 338 302 272    No results for input(s): "CKTOTAL", "CKMB", "CKMBINDEX", "TROPONINI" in the last 168 hours. No results for input(s): "LABPROT", "INR" in the last 72 hours. No results for input(s): "COLORURINE", "LABSPEC", "PHURINE", "GLUCOSEU", "HGBUR", "BILIRUBINUR", "KETONESUR", "PROTEINUR", "UROBILINOGEN", "NITRITE", "LEUKOCYTESUR"  in the last 72 hours.  Invalid input(s): "APPERANCEUR"     Component Value Date/Time   CHOL 151 10/31/2022 0108   TRIG 61 10/31/2022 0108   HDL 53 10/31/2022 0108   CHOLHDL 2.8 10/31/2022 0108   VLDL 12 10/31/2022 0108   LDLCALC 86 10/31/2022 0108   Lab Results  Component Value Date   HGBA1C 6.2 (H) 10/30/2022   No results found for: "LABOPIA", "COCAINSCRNUR", "LABBENZ", "AMPHETMU", "THCU", "LABBARB"  No results for input(s): "ETH" in the last 168 hours.  I have personally reviewed the radiological images below and agree with the radiology interpretations.  MR BRAIN WO CONTRAST  Result Date: 11/03/2022 CLINICAL DATA:  Stroke follow-up.  Right M1 occlusion. EXAM: MRI HEAD WITHOUT CONTRAST TECHNIQUE: Multiplanar, multiecho pulse sequences of the brain and surrounding structures were obtained without intravenous contrast. COMPARISON:  Same-day CT head FINDINGS: Incomplete study due to patient tolerance. Motion degraded axial FLAIR, axial SWI, and axial and coronal DWI sequences were obtained. 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 There is no associated hemorrhage or mass effect. There is no  acute intracranial hemorrhage, extra-axial fluid collection, or acute infarct. There is unchanged background parenchymal volume loss with prominence of the ventricular system and extra-axial CSF spaces. The ventricles are similar in size to 2017. There is extensive confluent FLAIR signal abnormality in the supratentorial white matter consistent with advanced underlying chronic small-vessel ischemic change. 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 There is no mass lesion.  There is no mass effect or midline shift. Vascular: Not well assessed on the current study. Skull and  upper cervical spine: Grossly unremarkable on the provided sequences. Sinuses/Orbits: The paranasal sinuses are clear. The globes and orbits are grossly unremarkable. Other: None. IMPRESSION: 1. Incomplete, motion degraded study. 2. Small areas of acute infarct in the right frontal and temporal lobe cortex and caudate body without hemorrhage or mass effect. 3. 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 Electronically Signed   By: Valetta Mole [name]   On: 11/03/2022 19:16   CT HEAD CODE STROKE WO CONTRAST  Result Date: 11/03/2022 CLINICAL DATA:  Left-sided deficit and facial droop. EXAM: CT ANGIOGRAPHY HEAD AND NECK TECHNIQUE: Multidetector CT imaging of the head and neck was performed using the standard protocol during bolus administration of intravenous contrast. Multiplanar CT image reconstructions and MIPs were obtained to evaluate the vascular anatomy. Carotid stenosis measurements (when applicable) are obtained utilizing NASCET criteria, using the distal internal carotid diameter as the denominator. RADIATION DOSE REDUCTION: This exam was performed according to the departmental dose-optimization program which includes automated exposure control, adjustment of the mA and/or kV according to patient size and/or use of iterative reconstruction technique. CONTRAST:  34m OMNIPAQUE IOHEXOL 350 MG/ML SOLN COMPARISON:  Brain MRI 12/30/2015 FINDINGS: CT HEAD FINDINGS Brain: There is no evidence of acute intracranial hemorrhage, extra-axial fluid collection, or acute territorial infarct. ASPECTS is 10. There is moderate background parenchymal volume loss with prominence of the ventricular system and extra-axial CSF spaces. The ventricles are similar in size to 2017. Extensive confluent hypodensity throughout the supratentorial white matter likely reflects sequela of underlying chronic small-vessel ischemic  change There is no mass lesion.  There is no mass effect or midline shift. Vascular: See below. Skull: Normal. Negative for fracture or focal lesion. Sinuses/Orbits: The paranasal sinuses are clear. Bilateral lens implants are in place. The globes and orbits are otherwise unremarkable. Other: None. Review of the MIP images confirms the above findings CTA NECK FINDINGS Aortic arch: The imaged aortic arch is normal. The origins of the left common carotid and subclavian arteries are patent. The brachiocephalic artery origin is not imaged. The subclavian arteries are patent to the level Right carotid system: Imaged. The right common, internal, and external carotid arteries are patent, without hemodynamically significant stenosis or occlusion. There is no dissection or aneurysm. Left carotid system: The left common, internal, and external carotid arteries are patent, without hemodynamically significant stenosis or occlusion. There is no dissection or aneurysm. Vertebral arteries: The vertebral arteries are patent, without hemodynamically significant stenosis or occlusion. There is no dissection or aneurysm. Skeleton: There is no acute osseous abnormality or suspicious osseous lesion. There is no visible canal hematoma. Other neck: The soft tissues of the neck are unremarkable. Upper chest: The imaged lung apices are clear. Review of the MIP images confirms the above findings CTA HEAD FINDINGS Anterior circulation: The intracranial ICAs are patent. There is a right M1  occlusion. An inferior right M2 branch reconstitutes in the sylvian fissure common but there is overall poor collateral flow in the MCA distribution. The left MCA branches are patent, without proximal stenosis or occlusion. The bilateral ACAs are patent, without proximal stenosis or occlusion. The anterior communicating artery is normal. There is no aneurysm or AVM. Posterior circulation: The bilateral V4 segments are patent. The basilar artery is patent.  The major cerebellar arteries appear patent. The bilateral PCAs are patent, without proximal stenosis or occlusion. Posterior communicating arteries are not definitely seen. There is no aneurysm or AVM. Venous sinuses: As permitted by contrast timing, patent. Anatomic variants: None. Review of the MIP images confirms the above findings IMPRESSION: 1. No acute intracranial pathology on initial noncontrast head CT. ASPECTS is 10. 2. 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 3. Otherwise patent vasculature of the head and neck without other hemodynamically significant stenosis or occlusion. Results of the initial noncontrast head CT were paged via Amion to Dr. Erlinda Hong at 2:35 pm. CTA results were discussed at 2:38 pm. Electronically Signed   By: Valetta Mole [name]   On: 11/03/2022 14:44   CT ANGIO HEAD NECK W WO CM (CODE STROKE)  Result Date: 11/03/2022 CLINICAL DATA:  Left-sided deficit and facial droop. EXAM: CT ANGIOGRAPHY HEAD AND NECK TECHNIQUE: Multidetector CT imaging of the head and neck was performed using the standard protocol during bolus administration of intravenous contrast. Multiplanar CT image reconstructions and MIPs were obtained to evaluate the vascular anatomy. Carotid stenosis measurements (when applicable) are obtained utilizing NASCET criteria, using the distal internal carotid diameter as the denominator. RADIATION DOSE REDUCTION: This exam was performed according to the departmental dose-optimization program which includes automated exposure control, adjustment of the mA and/or kV according to patient size and/or use of iterative reconstruction technique. CONTRAST:  35m OMNIPAQUE IOHEXOL 350 MG/ML SOLN COMPARISON:  Brain MRI 12/30/2015 FINDINGS: CT HEAD FINDINGS Brain: There is no evidence of acute intracranial hemorrhage, extra-axial fluid collection, or acute territorial infarct. ASPECTS is 10. There is moderate background parenchymal volume loss with prominence of  the ventricular system and extra-axial CSF spaces. The ventricles are similar in size to 2017. Extensive confluent hypodensity throughout the supratentorial white matter likely reflects sequela of underlying chronic small-vessel ischemic change There is no mass lesion.  There is no mass effect or midline shift. Vascular: See below. Skull: Normal. Negative for fracture or focal lesion. Sinuses/Orbits: The paranasal sinuses are clear. Bilateral lens implants are in place. The globes and orbits are otherwise unremarkable. Other: None. Review of the MIP images confirms the above findings CTA NECK FINDINGS Aortic arch: The imaged aortic arch is normal. The origins of the left common carotid and subclavian arteries are patent. The brachiocephalic artery origin is not imaged. The subclavian arteries are patent to the level Right carotid system: Imaged. The right common, internal, and external carotid arteries are patent, without hemodynamically significant stenosis or occlusion. There is no dissection or aneurysm. Left carotid system: The left common, internal, and external carotid arteries are patent, without hemodynamically significant stenosis or occlusion. There is no dissection or aneurysm. Vertebral arteries: The vertebral arteries are patent, without hemodynamically significant stenosis or occlusion. There is no dissection or aneurysm. Skeleton: There is no acute osseous abnormality or suspicious osseous lesion. There is no visible canal hematoma. Other neck: The soft tissues of the neck are unremarkable. Upper chest: The imaged lung apices are clear. Review of the MIP images confirms the above  findings CTA HEAD FINDINGS Anterior circulation: The intracranial ICAs are patent. There is a right M1 occlusion. An inferior right M2 branch reconstitutes in the sylvian fissure common but there is overall poor collateral flow in the MCA distribution. The left MCA branches are patent, without proximal stenosis or occlusion.  The bilateral ACAs are patent, without proximal stenosis or occlusion. The anterior communicating artery is normal. There is no aneurysm or AVM. Posterior circulation: The bilateral V4 segments are patent. The basilar artery is patent. The major cerebellar arteries appear patent. The bilateral PCAs are patent, without proximal stenosis or occlusion. Posterior communicating arteries are not definitely seen. There is no aneurysm or AVM. Venous sinuses: As permitted by contrast timing, patent. Anatomic variants: None. Review of the MIP images confirms the above findings IMPRESSION: 1. No acute intracranial pathology on initial noncontrast head CT. ASPECTS is 10. 2. 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 3. Otherwise patent vasculature of the head and neck without other hemodynamically significant stenosis or occlusion. Results of the initial noncontrast head CT were paged via Amion to Dr. Erlinda Hong at 2:35 pm. CTA results were discussed at 2:38 pm. Electronically Signed   By: Valetta Mole [name]   On: 11/03/2022 14:44   DG CHEST PORT 1 VIEW  Result Date: 10/31/2022 CLINICAL DATA:  Shortness of breath EXAM: PORTABLE CHEST 1 VIEW COMPARISON:  Chest x-ray dated October 31, 2022 FINDINGS: Cardiac and mediastinal contours are unchanged. Increased bilateral interstitial opacities. Similar right basilar opacity. Probable new small bilateral pleural effusions. No evidence of pneumothorax IMPRESSION: 1. Slightly increased bilateral interstitial opacities, likely due to worsened pulmonary edema. 2. Similar right basilar opacity which may be due to infection or aspiration. 3. Probable new small bilateral pleural effusions. Electronically Signed   By: Yetta Glassman [name]   On: 10/31/2022 15:57   DG CHEST PORT 1 VIEW  Result Date: 10/31/2022 CLINICAL DATA:  Post thoracentesis yesterday, shortness of breath, atrial fibrillation EXAM: PORTABLE CHEST 1 VIEW COMPARISON:  Portable exam 0606 hours compared  to 10/30/2022 FINDINGS: Upper normal heart size. Mediastinal contours and pulmonary vascularity normal. Bibasilar atelectasis. Patchy opacity RIGHT base question infiltrate, little changed. Upper lungs clear. No pleural effusion or pneumothorax. 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 IMPRESSION: Bibasilar atelectasis and probable RIGHT basilar infiltrate. Electronically Signed   By: Lavonia Dana [name]   On: 10/31/2022 08:30   DG CHEST PORT 1 VIEW  Result Date: 10/30/2022 CLINICAL DATA:  Congestive heart failure status post thoracentesis EXAM: PORTABLE CHEST 1 VIEW COMPARISON:  Chest radiograph dated 10/29/2022, CTA chest dated 10/29/2022 FINDINGS: Normal lung volumes. Persistent confluence right lower lung opacity. Patchy left basilar opacity. No definite pleural effusion questionable trace right apical pneumothorax. The heart size and mediastinal contours are within normal limits. The visualized skeletal structures are unremarkable. IMPRESSION: 1. Persistent confluence right lower lung opacity, suspicious for pneumonia. 2. Patchy left basilar opacity, likely atelectasis. 3. Questionable trace right apical pneumothorax. No definite pleural effusion. Recommend attention on follow-up. Electronically Signed   By: Darrin Nipper [name]   On: 10/30/2022 15:58   ECHOCARDIOGRAM COMPLETE  Result Date: 10/30/2022    ECHOCARDIOGRAM REPORT   Patient Name:   Leslie [name] Date of Exam: 10/30/2022 Medical Rec #:  [id number]           Height:       67.5 in Accession #:    [id number]          Weight:  171.5 lb Date of Birth:  May 30, 1941           BSA:          1.905 m Patient Age:    57 years            BP:           119/92 mmHg Patient Gender: F                   HR:           103 bpm. Exam Location:  Inpatient Procedure: 2D Echo, Color Doppler and Cardiac Doppler Indications:    I48.91* Unspecified atrial fibrillation  History:        Patient has prior history of Echocardiogram  examinations, most                 recent 01/10/2016. Arrythmias:Atrial Fibrillation.  Sonographer:    Raquel Sarna Senior RDCS Referring Phys: [phone] A CALDWELL [place]  1. Left ventricular ejection fraction, by estimation, is 40 to 45%. The left ventricle has mildly decreased function. The left ventricle demonstrates global hypokinesis. There is moderate asymmetric left ventricular hypertrophy of the basal-septal segment. Left ventricular diastolic parameters are indeterminate.  2. Right ventricular systolic function is normal. The right ventricular size is normal. There is normal pulmonary artery systolic pressure.  3. Left atrial size was mildly dilated.  4. The mitral valve is abnormal. 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 No evidence of mitral stenosis.  5. The aortic valve was not well visualized. Aortic valve regurgitation is trivial. No aortic stenosis is present.  6. The inferior vena cava is dilated in size with >50% respiratory variability, suggesting right atrial pressure of 8 mmHg. Comparison(s): MR is worse from prior report. FINDINGS  Left Ventricle: Left ventricular ejection fraction, by estimation, is 40 to 45%. The left ventricle has mildly decreased function. The left ventricle demonstrates global hypokinesis. The left ventricular internal cavity size was normal in size. There is  moderate asymmetric left ventricular hypertrophy of the basal-septal segment. Left ventricular diastolic parameters are indeterminate. Right Ventricle: The right ventricular size is normal. No increase in right ventricular wall thickness. Right ventricular systolic function is normal. There is normal pulmonary artery systolic pressure. The tricuspid regurgitant velocity is 2.72 m/s, and  with an assumed right atrial pressure of 3 mmHg, the estimated right ventricular systolic pressure is 37.8 mmHg. Left Atrium: Left atrial size was mildly dilated. Right Atrium: Right atrial size was normal in size.  Pericardium: The pericardium was not well visualized. Mitral Valve: MR volume 52. The mitral valve is abnormal. Mild mitral annular calcification. 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 No evidence of mitral valve stenosis. Tricuspid Valve: The tricuspid valve is not well visualized. Tricuspid valve regurgitation is not demonstrated. No evidence of tricuspid stenosis. Aortic Valve: The aortic valve was not well visualized. Aortic valve regurgitation is trivial. No aortic stenosis is present. Pulmonic Valve: The pulmonic valve was not well visualized. Pulmonic valve regurgitation is not visualized. No evidence of pulmonic stenosis. Aorta: The aortic root, ascending aorta and aortic arch are all structurally normal, with no evidence of dilitation or obstruction. Venous: The inferior vena cava is dilated in size with greater than 50% respiratory variability, suggesting right atrial pressure of 8 mmHg. IAS/Shunts: No atrial level shunt detected by color flow Doppler.  LEFT VENTRICLE PLAX 2D LVIDd:         4.20 cm LVIDs:  3.50 cm LV PW:         0.90 cm LV IVS:        1.25 cm LVOT diam:     2.10 cm LV SV:         40 LV SV Index:   21 LVOT Area:     3.46 cm  RIGHT VENTRICLE RV S prime:     10.30 cm/s TAPSE (M-mode): 1.3 cm LEFT ATRIUM             Index        RIGHT ATRIUM           Index LA diam:        3.80 cm 1.99 cm/m   RA Area:     13.50 cm LA Vol (A2C):   65.3 ml 34.28 ml/m  RA Volume:   31.30 ml  16.43 ml/m LA Vol (A4C):   76.4 ml 40.11 ml/m LA Biplane Vol: 75.7 ml 39.74 ml/m  AORTIC VALVE LVOT Vmax:   74.00 cm/s LVOT Vmean:  48.700 cm/s LVOT VTI:    0.116 m  AORTA Ao Root diam: 3.30 cm Ao Asc diam:  3.10 cm MR Peak grad:    84.6 mmHg    TRICUSPID VALVE MR Mean grad:    60.0 mmHg    TR Peak grad:   29.6 mmHg MR Vmax:         460.00 cm/s  TR Vmax:        272.00 cm/s MR Vmean:        370.5 cm/s MR PISA:         4.02 cm     SHUNTS MR PISA Eff ROA: 28 mm       Systemic VTI:  0.12 m MR PISA Radius:   0.80 cm      Systemic Diam: 2.10 cm Rudean Haskell MD Electronically signed by Rudean Haskell MD Signature Date/Time: 10/30/2022/10:54:26 AM    Final    CT Angio Chest PE W and/or Wo Contrast  Result Date: 10/29/2022 CLINICAL DATA:  Pulmonary embolism (PE) suspected, low to intermediate prob, positive D-dimer EXAM: CT ANGIOGRAPHY CHEST WITH CONTRAST TECHNIQUE: Multidetector CT imaging of the chest was performed using the standard protocol during bolus administration of intravenous contrast. Multiplanar CT image reconstructions and MIPs were obtained to evaluate the vascular anatomy. RADIATION DOSE REDUCTION: This exam was performed according to the departmental dose-optimization program which includes automated exposure control, adjustment of the mA and/or kV according to patient size and/or use of iterative reconstruction technique. CONTRAST:  72m OMNIPAQUE IOHEXOL 350 MG/ML SOLN COMPARISON:  None Available. FINDINGS: Cardiovascular: Heart is normal size. Aorta is normal caliber. Scattered coronary artery and aortic calcifications. No filling defects in the pulmonary arteries to suggest pulmonary emboli. Mediastinum/Nodes: Mildly prominent mediastinal and bilateral hilar lymph nodes. No axillary adenopathy. Trachea and esophagus are unremarkable. Lungs/Pleura: Moderate right pleural effusion. Airspace disease in the right lower lobe compatible with pneumonia. Clustered tree-in-bud nodular densities in the right upper lobe also likely infectious. No confluent opacity on the left. Upper Abdomen: No acute findings Musculoskeletal: Chest wall soft tissues are unremarkable. No acute bony abnormality. Review of the MIP images confirms the above findings. IMPRESSION: Right lower lobe consolidation with moderate right pleural effusion. Tree-in-bud nodular densities in the right upper lobe. Findings compatible with pneumonia. No evidence of pulmonary embolus. Coronary artery disease. Aortic  Atherosclerosis (ICD10-I70.0). Electronically Signed   By: KRolm BaptiseM.D.   On: 10/29/2022 23:43   DG Chest  Portable 1 View  Result Date: 10/29/2022 CLINICAL DATA:  Atrial fibrillation, palpitations EXAM: PORTABLE CHEST 1 VIEW COMPARISON:  10/23/2022 FINDINGS: Transverse diameter of heart is slightly increased. There are no signs of alveolar pulmonary edema. There is interval decrease in patchy infiltrates in right parahilar region and right lower lung field. New small patchy infiltrate is seen in the lateral aspect of left lower lung field. There is no pleural effusion or pneumothorax. IMPRESSION: There is interval decrease in patchy infiltrates in right mid and right lower lung fields suggesting resolving atelectasis/pneumonia. There is new small patchy infiltrate in left lower lung fields suggesting multifocal pneumonia. Electronically Signed   By: Elmer Picker [name]   On: 10/29/2022 16:37   DG Chest 2 View  Result Date: 10/25/2022 CLINICAL DATA:  Cough and shortness of breath. Recent diagnosis of pneumonia. EXAM: CHEST - 2 VIEW COMPARISON:  PA and lateral chest 10/05/2022. FINDINGS: Patchy airspace disease in the right chest, worst lung base has progressed since the prior study. The left lung remains clear. Heart size is normal. No pneumothorax or pleural fluid. No acute bony abnormality. IMPRESSION: Worsened airspace disease in the right chest most consistent with progressive pneumonia. Electronically Signed   By: Inge Rise [name]   On: 10/25/2022 07:50     PHYSICAL EXAM  Temp:  [97.3 F (36.3 C)-98 F (36.7 C)] 97.3 F (36.3 C) (11/28 1200) Pulse Rate:  [72-109] 81 (11/28 1200) Resp:  [11-24] 20 (11/28 1200) BP: (86-128)/(53-95) 117/76 (11/28 1200) SpO2:  [92 %-99 %] 95 % (11/28 1200)  General - Well nourished, well developed, in no apparent distress.  Cardiovascular - irregularly irregular heart rate and rhythm.  Neuro - awake, alert, eyes open, orientated to age,  place, time and people. No aphasia, fluent language, following all simple commands, no aphasia or dysarthria today. No gaze palsy, tracking bilaterally, visual field fields full, but patient cooperation with test not optimal. Left mild facial droop, left palpebral fissure wider and left eye closure weaker than right. Tongue midline. RUE and RLE 5/5. LLE 5-/5, LUE 4+/5 proximal and bicep, 4+/5 tricep and finger grip. Sensation decreased on the left face, b/l FTN intact, gait not tested.     ASSESSMENT/PLAN Ms. Leslie [name] is a 81 y.o. female with history of recently diagnosed afib RVR admitted for afib management. Put on eliquis but developed acute onset left facial droop, left sided weakness, left neglect and right gaze preference with moderate dysarthria. No TNK given due to on eliquis.  Patient has had mechanical thrombectomy with TICI 3 flow achieved.  She is ready to move out of the ICU and is awaiting admission to CIR for rehab.  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 MRI  Small areas of acute infarct in the right frontal and temporal lobe cortex and caudate body without hemorrhage or mass effect. S/p IR with TICI3 reperfusion 2D Echo  EF 40-45% LDL 86 HgbA1c 6.2 eliquis for VTE prophylaxis Eliquis (apixaban) daily prior to admission, now on Eliquis (apixaban) daily given small size of infarcts. Continue on discharge Patient counseled to be compliant with her antithrombotic medications Ongoing aggressive stroke risk factor management Therapy recommendations:  CIR Disposition:  pending  Afib RVR Recent diagnosis of Afib  Rate better controlled but still in  RVR Cardiology on board Off amiodarone and on cardizem Resumed eliquis '5mg'$  bid  BP management Stable Long term BP goal  normotensive  Hyperlipidemia Home meds:  none  LDL 86, goal < 70 Now on crestor 10 No high intensity statin given LDL near goal and advanced age Continue statin at discharge  Other Stroke Risk Factors Advanced age  Other Active Problems Leukocytosis WBC 14.4->17.0->14.3->11.0->14.5-> 12.9 AKI Cre 0.86->1.19-> 1.10  Hospital day # [place] , MSN, AGACNP-BC [facility] See Amion for schedule and pager information 11/06/2022 12:48 PM   ATTENDING NOTE: I reviewed above note and agree with the assessment and plan. Pt was seen and examined.   Son in law is at the bedside. Pt sitting in chair for lunch, she said she has taken a lot of pills and now does not have much appetite for the lunch. Her left facial droop continues to improve, still has mild dysarthria. Left hand weakness continues to improve too. HR 130s, cardiology on board. On eliquis and statin, will continue on discharge. PT/OT recommend CIR.   Neurology will sign off. Please call with questions. Pt will follow up with Dr. Rexene Alberts at [facility] in about 4 weeks. Thanks for the consult.  For detailed assessment and plan, please refer to above/below as I have made changes wherever appropriate.   Rosalin Hawking, MD PhD Stroke Neurology 11/06/2022 4:09 PM    To contact Stroke Continuity provider, please refer to [email or web address] After hours, contact General Neurology

## 2022-11-06 NOTE — Progress Notes (Addendum)
[facility] PROGRESS NOTE  Aija Scarfo (DOB: July 19, 1941) LOV:[id number] PCP: Sonia Side., FNP  Brief Narrative: Leslie [name] is an 81 y.o. female with a history of hypothyroidism, GERD who presented to the ED on 10/29/2022 from PCP's office after finding new AFib with RVR. There was noted to have pneumonia with right pleural effusion treated with antibiotics and thoracentesis (transudate, negative Cx and cytology). Cardiology was consulted, eliquis started, and ultimately got rates under adequate control. On 11/25 she developed right gaze deviation, left hemiparesis and neglect due to right M1 occlusion, taken emergently for mechanical thrombectomy which restored TICI 3 flow.   She has stabilized in the neuro ICU, transferred back to hospitalist service 11/28 awaiting approval for CIR.   Subjective: No new complaints, states she's feeling like her left hand weakness is improved, still some slurred speech but eager to pursue rehabilitation. Has a lot of social and family support.   Objective: BP 117/76 (BP Location: Right Arm)   Pulse 81   Temp (!) 97.3 F (36.3 C) (Oral)   Resp 20   Ht 5' 7.5" (1.715 m)   Wt 64.1 kg   SpO2 95%   BMI 21.81 kg/m   Gen: Elderly, WDWN female in no distress Pulm: Clear, nonlabored  CV: Irreg irreg with rate in 80's, no MRG or pitting edema GI: Soft, NT, ND, +BS  Neuro: Alert and oriented, left facial droop and left UE > LE weakness noted. Ext: Warm, no deformities Skin: No rashes, lesions or ulcers on visualized skin   Assessment & Plan: Persistent atrial fibrillation/flutter: Initial RVR since resolved.  - Continue oral diltiazem '360mg'$  daily, rates fairly controlled. LVEF felt to be nearly 50%, so CCB ok per cardiology. No evidence of HF decompensation at this time.  - Rate control strategy settled on for now, consider outpatient cardioversion - Continue eliquis '5mg'$  po BID (34yr, 64kg, Cr 0.8-1.1). Given recent start, not  considered a failure. CHA2DS2-VASc score is 6 including embolic CVA.  CAP: s/p 7 days antibiotics, recovered from this clinically.  - Consider further work up if leukocytosis persists at follow up or develops fever.   Right pleural effusion: Negative gram stain, culture, and cytology, transudate by Light's criteria. s/p thoracentesis 11/21.   Right M1 occlusion, embolic CVA: s/p mechanical thrombectomy by Dr. DEstanislado Pandy11/25.  - Continue eliquis, ok per neurology - Started rosuvastatin '10mg'$ , LDL 86. - Will need rehabilitation, currently awaiting insurance authorization for inpatient rehab.   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 Clinically euvolemic.  - Management per cardiology, can repeat echo outside scope of acute illness/RVR. Ok to use diltiazem.  - Continue lasix for now, was also on 50cc/hr maintenance IVF which is stopped. - Further initiation/titration of GDMT deferred to cardiology, perhaps pending repeat echo after recovery.  Moderate-severe MR: Anticipate improvement with rhythm control  Emphysema, bronchomalacia:  - Outpatient PFTs recommended  AOCD: Low normal B12 - Continue B12 supplementation and monitoring for bleeding on anticoagulation.   Prediabetes: HbA1c 6.2%.  - Lifestyle modifications recommended, will need attention at follow up.   Hypothyroidism: TSH 2.462.  - Continue synthroid  GERD:  - Continue antacid  RPatrecia Pour MD [facility] [email or web address] 11/06/2022, 2:47 PM

## 2022-11-06 NOTE — Progress Notes (Signed)
Inpatient Rehabilitation Admissions Coordinator   I met at bedside with patient , SLP. Amanda and son in law. Family will arrange 24/7 supervision after a possible CIR admit . They wish to pursue CIR with [employer]. I will begin Auth today.   , RN, MSN Rehab Admissions Coordinator [phone] 11/06/2022 12:24 PM  

## 2022-11-06 NOTE — Progress Notes (Signed)
Physical Therapy Treatment Patient Details Name: Leslie [name] MRN: [id number] DOB: June 16, 1941 Today's Date: 11/06/2022   History of Present Illness Pt is an 81 y.o. female admitted 10/29/22 after being directed by PCP to ED for afib with RVR; workup also revealed CAP. 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 PMH includes CKD, stroke, arthritis, insomnia, plantar fasciitis, back sx, L THA (2022).    PT Comments    Pt making steady progress towards her physical therapy goals and remains motivated to participate. Session focused on progressive gait distance and gait training, dynamic standing balance, and visual scanning task to promote left sided attention. Pt requiring min assist overall with functional mobility. Continues with mild left sided weakness, decreased left attention, impaired cognition, and decreased standing balance. Will benefit from AIR to address deficits and maximize functional independence. Suspect good progress given PLOF, motivation and family support.     Recommendations for follow up therapy are one component of a multi-disciplinary discharge planning process, led by the attending physician.  Recommendations may be updated based on patient status, additional functional criteria and insurance authorization.  Follow Up Recommendations  Acute inpatient rehab (3hours/day)     Assistance Recommended at Discharge Frequent or constant Supervision/Assistance  Patient can return home with the following A little help with walking and/or transfers;A little help with bathing/dressing/bathroom;Assistance with cooking/housework;Assist for transportation;Direct supervision/assist for medications management;Help with stairs or ramp for entrance   Equipment Recommendations  None recommended by PT    Recommendations for Other Services       Precautions / Restrictions Precautions Precautions:  Fall Restrictions Weight Bearing Restrictions: No     Mobility  Bed Mobility               General bed mobility comments: OOB in chair    Transfers Overall transfer level: Needs assistance Equipment used: Rolling walker (2 wheels) Transfers: Sit to/from Stand Sit to Stand: Min guard           General transfer comment: Increased time    Ambulation/Gait Ambulation/Gait assistance: Min assist Gait Distance (Feet): 150 Feet Assistive device: Rolling walker (2 wheels) Gait Pattern/deviations: Step-through pattern, Decreased step length - right, Decreased stride length, Trunk flexed, Drifts right/left Gait velocity: decreased     General Gait Details: Verbal cues for increased gait speed and environmental negotiation. Drifts towards left and needs min cues for obstacle navigation. Tendency to keep neck flexed due to vertigo   Stairs             Wheelchair Mobility    Modified Rankin (Stroke Patients Only) Modified Rankin (Stroke Patients Only) Pre-Morbid Rankin Score: No symptoms Modified Rankin: Moderately severe disability     Balance Overall balance assessment: Needs assistance Sitting-balance support: Feet supported Sitting balance-Leahy Scale: Fair     Standing balance support: Bilateral upper extremity supported, During functional activity Standing balance-Leahy Scale: Poor                              Cognition Arousal/Alertness: Awake/alert Behavior During Therapy: WFL for tasks assessed/performed Overall Cognitive Status: Impaired/Different from baseline Area of Impairment: Attention, Awareness, Problem solving                   Current Attention Level: Selective       Awareness: Emergent Problem Solving: Slow processing, Requires verbal cues General Comments: Difficulty with dual tasking  Exercises Other Exercises Other Exercises: Dynamic standing balance and visual scanning task using RUE then RUE to  place items on table into bucket    General Comments        Pertinent Vitals/Pain Pain Assessment Pain Assessment: No/denies pain    Home Living                          Prior Function            PT Goals (current goals can now be found in the care plan section) Acute Rehab PT Goals Potential to Achieve Goals: Good Progress towards PT goals: Progressing toward goals    Frequency    Min 4X/week      PT Plan Current plan remains appropriate    Co-evaluation              AM-PAC PT "6 Clicks" Mobility   Outcome Measure  Help needed turning from your back to your side while in a flat bed without using bedrails?: A Little Help needed moving from lying on your back to sitting on the side of a flat bed without using bedrails?: A Little Help needed moving to and from a bed to a chair (including a wheelchair)?: A Little Help needed standing up from a chair using your arms (e.g., wheelchair or bedside chair)?: A Little Help needed to walk in hospital room?: A Little Help needed climbing 3-5 steps with a railing? : A Lot 6 Click Score: 17    End of Session Equipment Utilized During Treatment: Gait belt Activity Tolerance: Patient tolerated treatment well Patient left: in chair;with call bell/phone within reach;with chair alarm set;with family/visitor present Nurse Communication: Mobility status PT Visit Diagnosis: Other symptoms and signs involving the nervous system (R29.898);Hemiplegia and hemiparesis;Other abnormalities of gait and mobility (R26.89) Hemiplegia - Right/Left: Left Hemiplegia - dominant/non-dominant: Non-dominant Hemiplegia - caused by: Cerebral infarction     Time: 9622-2979 PT Time Calculation (min) (ACUTE ONLY): 26 min  Charges:  $Gait Training: 8-22 mins $Therapeutic Activity: 8-22 mins                     Leslie [name], PT, DPT Acute Rehabilitation Services Office [phone]    Leslie [name] 11/06/2022, 5:00 PM

## 2022-11-06 NOTE — Progress Notes (Signed)
Heart Failure Navigator Progress Note  Assessed for Heart & Vascular TOC clinic readiness.  Patient has a hospital follow up scheduled with [facility] on 11/27/22.   Navigator will sign off at this time.   Earnestine Leys, BSN, Clinical cytogeneticist Only

## 2022-11-07 DIAGNOSIS — I4891 Unspecified atrial fibrillation: Secondary | ICD-10-CM | POA: Diagnosis not present

## 2022-11-07 DIAGNOSIS — E039 Hypothyroidism, unspecified: Secondary | ICD-10-CM | POA: Diagnosis present

## 2022-11-07 DIAGNOSIS — I5023 Acute on chronic systolic (congestive) heart failure: Secondary | ICD-10-CM

## 2022-11-07 DIAGNOSIS — I6601 Occlusion and stenosis of right middle cerebral artery: Secondary | ICD-10-CM | POA: Diagnosis not present

## 2022-11-07 DIAGNOSIS — J189 Pneumonia, unspecified organism: Secondary | ICD-10-CM | POA: Diagnosis not present

## 2022-11-07 DIAGNOSIS — J449 Chronic obstructive pulmonary disease, unspecified: Secondary | ICD-10-CM | POA: Diagnosis present

## 2022-11-07 LAB — CBC
HCT: 37.2 % (ref 36.0–46.0)
Hemoglobin: 11.7 g/dL — ABNORMAL LOW (ref 12.0–15.0)
MCH: 27.7 pg (ref 26.0–34.0)
MCHC: 31.5 g/dL (ref 30.0–36.0)
MCV: 88.2 fL (ref 80.0–100.0)
Platelets: 287 10*3/uL (ref 150–400)
RBC: 4.22 MIL/uL (ref 3.87–5.11)
RDW: 15.4 % (ref 11.5–15.5)
WBC: 11.8 10*3/uL — ABNORMAL HIGH (ref 4.0–10.5)
nRBC: 0 % (ref 0.0–0.2)

## 2022-11-07 LAB — BASIC METABOLIC PANEL
Anion gap: 9 (ref 5–15)
BUN: 17 mg/dL (ref 8–23)
CO2: 25 mmol/L (ref 22–32)
Calcium: 8.8 mg/dL — ABNORMAL LOW (ref 8.9–10.3)
Chloride: 101 mmol/L (ref 98–111)
Creatinine, Ser: 1.12 mg/dL — ABNORMAL HIGH (ref 0.44–1.00)
GFR, Estimated: 50 mL/min — ABNORMAL LOW (ref >60)
Glucose, Bld: 118 mg/dL — ABNORMAL HIGH (ref 70–99)
Potassium: 3.8 mmol/L (ref 3.5–5.1)
Sodium: 135 mmol/L (ref 135–145)

## 2022-11-07 MED ORDER — BISACODYL 10 MG RE SUPP
10.0000 mg | Freq: Every day | RECTAL | Status: DC | PRN
  Administered 2022-11-07: 10 mg via RECTAL
  Filled 2022-11-07: qty 1

## 2022-11-07 MED ORDER — FLEET ENEMA 7-19 GM/118ML RE ENEM: 1.0000 | ENEMA | Freq: Every day | RECTAL | Status: DC | PRN

## 2022-11-07 MED ORDER — MAGNESIUM HYDROXIDE 400 MG/5ML PO SUSP: 30.0000 mL | Freq: Every day | ORAL | Status: DC | PRN

## 2022-11-07 NOTE — Assessment & Plan Note (Signed)
Acute CVA, left sided hemiparesis, that is improving with physical and occupational therapy. Swallow dysfunction, currently on dysphagia 3 diet.    Statin therapy and anticoagulation with apixaban. Continue Pt and Ot, follow up with inpatient rehab.

## 2022-11-07 NOTE — Progress Notes (Signed)
   11/07/22 1603  Mobility  Activity Dangled on edge of bed  Level of Assistance Moderate assist, patient does 50-74%  Assistive Device None  Distance Ambulated (ft) 0 ft  Activity Response Tolerated well  Mobility Referral Yes  $Mobility charge 1 Mobility   Mobility Specialist Progress Note  Pt was in bed and agreeable. deferred ambulation d/t " Not feeling like it anymore". Will follow up as time permits. RN notified.  Lucious Groves Mobility Specialist  Please contact via SecureChat or Rehab office at [phone]

## 2022-11-07 NOTE — Assessment & Plan Note (Addendum)
Patient with improvement of volume status at the time of her discharge. Echocardiogram showed reduced LV systolic function EF 40 to 45%, with global hypokinesis. Moderate asymmetric left ventricular hypertrophy of the basal septal segment. RV systolic function preserved. Moderate to severe mitral regurgitation.   Plan to continue rate control with diltiazem Continue diuretic therapy with spironolactone.  Hold on ARB due to risk of hypotension. She had orthostatic hypotension this morning with physical therapy.

## 2022-11-07 NOTE — Progress Notes (Signed)
Physical Therapy Treatment Patient Details Name: Leslie [name] MRN: [id number] DOB: October 31, 1941 Today's Date: 11/07/2022   History of Present Illness Pt is an 81 y.o. female admitted 10/29/22 after being directed by PCP to ED for afib with RVR; workup also revealed CAP. 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 PMH includes CKD, stroke, arthritis, insomnia, plantar fasciitis, back sx, L THA (2022).    PT Comments    Pt supine in bed on entry with blueberry muffin pieces on chest from eating breakfast. Encouraged pt to ask for assistance OOB for eating, pt agreeable. While preparing for ambulation, Nutritional Services in room to take meal orders. Pt requires increased time and redirection to make order. Pt requires min physical A however requires increased cuing for sequencing during session for mobilization and especially RW use. D/c plans remain appropriate at this time. PT will continue to follow acutely.    Recommendations for follow up therapy are one component of a multi-disciplinary discharge planning process, led by the attending physician.  Recommendations may be updated based on patient status, additional functional criteria and insurance authorization.  Follow Up Recommendations  Acute inpatient rehab (3hours/day)     Assistance Recommended at Discharge Frequent or constant Supervision/Assistance  Patient can return home with the following A little help with walking and/or transfers;A little help with bathing/dressing/bathroom;Assistance with cooking/housework;Assist for transportation;Direct supervision/assist for medications management;Help with stairs or ramp for entrance   Equipment Recommendations  None recommended by PT    Recommendations for Other Services       Precautions / Restrictions Precautions Precautions: Fall Restrictions Weight Bearing Restrictions: No     Mobility  Bed Mobility Overal  bed mobility: Needs Assistance Bed Mobility: Supine to Sit     Supine to sit: Min assist     General bed mobility comments: min A for pad scoot of hips to EoB    Transfers Overall transfer level: Needs assistance Equipment used: Rolling walker (2 wheels) Transfers: Sit to/from Stand Sit to Stand: Min guard           General transfer comment: increased time and effort, vc for hand placement    Ambulation/Gait Ambulation/Gait assistance: Min assist Gait Distance (Feet): 150 Feet Assistive device: Rolling walker (2 wheels) Gait Pattern/deviations: Step-through pattern, Decreased step length - right, Decreased stride length, Trunk flexed, Drifts right/left Gait velocity: decreased Gait velocity interpretation: <1.31 ft/sec, indicative of household ambulator   General Gait Details: min physical A, pt reports pain in shoulders from using RW. Requires moderate cuing for decreased use of UE for support and shoulder blade depression in relaxed postion to push RW Tendency to keep neck flexed due to vertigo    Modified Rankin (Stroke Patients Only) Modified Rankin (Stroke Patients Only) Pre-Morbid Rankin Score: No symptoms Modified Rankin: Moderately severe disability     Balance Overall balance assessment: Needs assistance Sitting-balance support: Feet supported Sitting balance-Leahy Scale: Fair     Standing balance support: Bilateral upper extremity supported, During functional activity Standing balance-Leahy Scale: Poor Standing balance comment: walker and min guard for static standing                            Cognition Arousal/Alertness: Awake/alert Behavior During Therapy: WFL for tasks assessed/performed Overall Cognitive Status: Impaired/Different from baseline Area of Impairment: Attention, Awareness, Problem solving  Current Attention Level: Selective       Awareness: Emergent Problem Solving: Slow processing, Requires  verbal cues General Comments: requires increased time and effort when placing meal order requiring redirection back to task at hand        Exercises Other Exercises Other Exercises: Dynamic standing balance and visual scanning task using RUE then RUE to place items on table into bucket    General Comments General comments (skin integrity, edema, etc.): VSS on RA, daughter present in room      Pertinent Vitals/Pain Pain Assessment Pain Assessment: No/denies pain     PT Goals (current goals can now be found in the care plan section) Acute Rehab PT Goals PT Goal Formulation: With patient Time For Goal Achievement: 11/18/22 Potential to Achieve Goals: Good Progress towards PT goals: Progressing toward goals    Frequency    Min 4X/week      PT Plan Current plan remains appropriate       AM-PAC PT "6 Clicks" Mobility   Outcome Measure  Help needed turning from your back to your side while in a flat bed without using bedrails?: A Little Help needed moving from lying on your back to sitting on the side of a flat bed without using bedrails?: A Little Help needed moving to and from a bed to a chair (including a wheelchair)?: A Little Help needed standing up from a chair using your arms (e.g., wheelchair or bedside chair)?: A Little Help needed to walk in hospital room?: A Little Help needed climbing 3-5 steps with a railing? : A Lot 6 Click Score: 17    End of Session Equipment Utilized During Treatment: Gait belt Activity Tolerance: Patient tolerated treatment well Patient left: in chair;with call bell/phone within reach;with chair alarm set;with family/visitor present Nurse Communication: Mobility status PT Visit Diagnosis: Other symptoms and signs involving the nervous system (R29.898);Hemiplegia and hemiparesis;Other abnormalities of gait and mobility (R26.89) Hemiplegia - Right/Left: Left Hemiplegia - dominant/non-dominant: Non-dominant Hemiplegia - caused by:  Cerebral infarction     Time: 0937-1000 PT Time Calculation (min) (ACUTE ONLY): 23 min  Charges:  $Gait Training: 8-22 mins $Therapeutic Activity: 8-22 mins                     Ranada Vigorito B. Migdalia Dk PT, DPT Acute Rehabilitation Services Please use secure chat or  Call Office [phone]    [place] 11/07/2022, 11:33 AM

## 2022-11-07 NOTE — Assessment & Plan Note (Signed)
No acute exacerbation

## 2022-11-07 NOTE — Progress Notes (Signed)
Occupational Therapy Treatment Patient Details Name: Leslie [name] MRN: [id number] DOB: 1941-06-23 Today's Date: 11/07/2022   History of present illness Pt is an 81 y.o. female admitted 10/29/22 after being directed by PCP to ED for afib with RVR; workup also revealed CAP. 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 PMH includes CKD, stroke, arthritis, insomnia, plantar fasciitis, back sx, L THA (2022).   OT comments  Alahna is making excellent progress and remains pleasant and motivated to participate. Overall pt required min A to get to EOB, min G for transfers and mobility with the RW. Pt tolerated unsupported standing at the sink during grooming with no sitting break and no LOB. Pt set up for self feeding at the end of the session. OT to continue to follow. POC remains appropriate.    Recommendations for follow up therapy are one component of a multi-disciplinary discharge planning process, led by the attending physician.  Recommendations may be updated based on patient status, additional functional criteria and insurance authorization.    Follow Up Recommendations  Acute inpatient rehab (3hours/day)     Assistance Recommended at Discharge Frequent or constant Supervision/Assistance  Patient can return home with the following  A little help with walking and/or transfers;A little help with bathing/dressing/bathroom;Assistance with cooking/housework;Assistance with feeding;Direct supervision/assist for medications management;Direct supervision/assist for financial management;Assist for transportation;Help with stairs or ramp for entrance   Equipment Recommendations  BSC/3in1;Other (comment)    Recommendations for Other Services Rehab consult    Precautions / Restrictions Precautions Precautions: Fall Precaution Comments: dys3, nectar Restrictions Weight Bearing Restrictions: No       Mobility Bed Mobility Overal  bed mobility: Needs Assistance Bed Mobility: Supine to Sit     Supine to sit: Min assist          Transfers Overall transfer level: Needs assistance Equipment used: Rolling walker (2 wheels) Transfers: Sit to/from Stand Sit to Stand: Min guard                 Balance Overall balance assessment: Needs assistance Sitting-balance support: Feet supported Sitting balance-Leahy Scale: Fair     Standing balance support: No upper extremity supported, During functional activity Standing balance-Leahy Scale: Fair Standing balance comment: standing at the sink for grooming without UE support                           ADL either performed or assessed with clinical judgement   ADL Overall ADL's : Needs assistance/impaired Eating/Feeding: Set up;Sitting Eating/Feeding Details (indicate cue type and reason): at the end of the session Grooming: Min guard;Standing Grooming Details (indicate cue type and reason): cues for sequencing                             Functional mobility during ADLs: Minimal assistance;Rolling walker (2 wheels) General ADL Comments: cues and increased time needed    Extremity/Trunk Assessment Upper Extremity Assessment Upper Extremity Assessment: LUE deficits/detail LUE Deficits / Details: 3+/5, full ROM, impiared sensory motor coordination. Paraesthesias throughout extremeity. Used functionally durign grooming LUE Sensation: decreased light touch;decreased proprioception LUE Coordination: decreased gross motor;decreased fine motor   Lower Extremity Assessment Lower Extremity Assessment: Defer to PT evaluation        Vision   Vision Assessment?: Vision impaired- to be further tested in functional context Additional Comments: L inattention, some dizziness  Perception     Praxis      Cognition Arousal/Alertness: Awake/alert Behavior During Therapy: WFL for tasks assessed/performed Overall Cognitive Status:  Impaired/Different from baseline Area of Impairment: Attention, Awareness, Problem solving                   Current Attention Level: Selective       Awareness: Emergent Problem Solving: Slow processing, Requires verbal cues General Comments: needs time for processing, cues for attention. only follows 1 step commands        Exercises      Shoulder Instructions       General Comments VSS on RA    Pertinent Vitals/ Pain       Pain Assessment Pain Assessment: No/denies pain  Home Living                                          Prior Functioning/Environment              Frequency  Min 2X/week        Progress Toward Goals  OT Goals(current goals can now be found in the care plan section)  Progress towards OT goals: Progressing toward goals  Acute Rehab OT Goals Patient Stated Goal: to go to rehab OT Goal Formulation: With patient Time For Goal Achievement: 11/18/22 Potential to Achieve Goals: Good ADL Goals Pt Will Perform Grooming: with modified independence;standing Pt Will Perform Upper Body Dressing: with modified independence;sitting Pt Will Perform Lower Body Dressing: with modified independence;sit to/from stand Pt Will Transfer to Toilet: with modified independence;ambulating Additional ADL Goal #1: pt will indep complete IADL medication management task  Plan Discharge plan remains appropriate    Co-evaluation                 AM-PAC OT "6 Clicks" Daily Activity     Outcome Measure   Help from another person eating meals?: A Little Help from another person taking care of personal grooming?: A Little Help from another person toileting, which includes using toliet, bedpan, or urinal?: A Little Help from another person bathing (including washing, rinsing, drying)?: A Little Help from another person to put on and taking off regular upper body clothing?: A Little Help from another person to put on and taking off  regular lower body clothing?: A Little 6 Click Score: 18    End of Session Equipment Utilized During Treatment: Gait belt;Rolling walker (2 wheels);Rollator (4 wheels)  OT Visit Diagnosis: Unsteadiness on feet (R26.81);Other abnormalities of gait and mobility (R26.89);Muscle weakness (generalized) (M62.81);Hemiplegia and hemiparesis Hemiplegia - Right/Left: Left Hemiplegia - dominant/non-dominant: Non-Dominant Hemiplegia - caused by: Cerebral infarction   Activity Tolerance Patient tolerated treatment well   Patient Left in chair;with call bell/phone within reach;with chair alarm set;with family/visitor present   Nurse Communication Mobility status        Time: 7673-4193 OT Time Calculation (min): 20 min  Charges: OT General Charges $OT Visit: 1 Visit OT Treatments $Self Care/Home Management : 8-22 mins    Elliot Cousin 11/07/2022, 5:59 PM

## 2022-11-07 NOTE — Care Management Important Message (Signed)
Important Message  Patient Details  Name: Leslie [name] MRN: [id number] Date of Birth: 09/10/1941   Medicare Important Message Given:  Yes     [name] 11/07/2022, 10:20 AM

## 2022-11-07 NOTE — Assessment & Plan Note (Signed)
Patient had antibiotic therapy with good toleration Right pleural effusion, parapneuonic with no complication.  She had thoracentesis on 11/21, culture and gram stain negative. Exudate per Light's criteria.   Continue oxymetry monitoring

## 2022-11-07 NOTE — Assessment & Plan Note (Signed)
Patient has been placed on AV blockade with diltiazem with appropriate rate control.   Continue anticoagulation with apixaban

## 2022-11-07 NOTE — Progress Notes (Signed)
Progress Note   Patient: Leslie [name] WER:[id number] DOB: January 30, 1941 DOA: 10/29/2022     8 DOS: the patient was seen and examined on 11/07/2022   Brief hospital course: Leslie [name] was admitted to the hospital with the working diagnosis of community acquired pneumonia complicated with uncontrolled atrial fibrillation. Hospitalization complicated by acute ischemic CVA, sp mechanical thrombectomy.   81 yo female with the past medical history of hypothyroid and GERD who presented with palpitations and dyspnea along with wheezing. Positive cough, that prompted a visit to her primary care provider who prescribed antibiotics and steroids. Unfortunately she continue to have symptoms, at a follow up at the PCP office, she was found in atrial fibrillation with RVR, she was referred to the ED for further evaluation. On her initial physical examination her blood pressure was 124/90, HR 106, RR 18 and 02 saturation 95%, lungs with no wheezing or rales, heart with S1 and S2 present, irregularly irregular, abdomen with no distention, and no lower extremity edema.   Na 141, K 4,0 Cl 110, bicarbonate 20 glucose 123, bun 24 cr 1,0 Mg 2,2  High sensitive troponin 13 and 11  Wbc 13.3 hgb 10,7 plt 333 Sars covid 19 negative   Chest radiograph with mild cardiomegaly, bilateral hilar vascular congestion, nodular dense infiltrates right lower and left lower lobe.  Ct chest with patchy dense infiltrated at the right lower lobe with right pleural effusion.  No pulmonary embolism.   EKG 153 bpm, normal axis, not prolonged qtc, atrial fibrillation rhythm, with no significant ST segment or T wave changes.   Patient was placed on antibiotic therapy.  Right thoracentesis (exudate).  AV blockade and anticoagulation for atrial fibrillation.   11/25 she developed right gaze deviation, and left hemiparesis, she was diagnosed with acute right M1 embolic occlusion and underwent emergent mechanical thrombectomy.   Required intensive care monitoring.   11/28 transfer to [facility]. Pending transfer to inpatient rehabilitation.     Assessment and Plan: * Atrial fibrillation with rapid ventricular response ([name]) Continue rate control. Anticoagulation with apixaban   Community acquired pneumonia of left lower lobe of lung Patient had antibiotic therapy with good toleration Right pleural effusion, parapneuonic with no complication.  Continue oxymetry monitoring   Middle cerebral artery embolism, right Acute CVA, continue close neuro checks.,  Statin therapy and anticoagulation with apixaban. Continue Pt and Ot, follow up with inpatient rehab.   Hypothyroidism Continue with levothyroxine  Pre diabetes with Hgb A1c 6,2   COPD (chronic obstructive pulmonary disease) ([name]) No acute exacerbation   Acute on chronic systolic CHF (congestive heart failure) ([name]) No signs of current exacerbation Patient with mild reduction in LV systolic function. Moderate to severe MR  Continue close blood pressure monitoring         Subjective: Patient out of bed to the chair, no chest pain or dyspnea, continue very weak and deconditioned   Physical Exam: Vitals:   11/07/22 0609 11/07/22 0735 11/07/22 1212 11/07/22 1615  BP:  113/84 (!) 104/54 101/78  Pulse:   (!) 103 94  Resp:  '18 18 17  '$ Temp:  (!) 97.2 F (36.2 C) (!) 97.2 F (36.2 C) (!) 97.4 F (36.3 C)  TempSrc:  Oral Oral Oral  SpO2:  100% 97% 97%  Weight: 72 kg     Height:       Neurology awake and alert ENT with mild pallor Cardiovascular with S1 and S2 present and regular with no gallops Respiratory with no rales or wheezing  Abdomen with no distention  No lower extremity edema  Data Reviewed:    Family Communication: I spoke with patient's daughter at the bedside, we talked in detail about patient's condition, plan of care and prognosis and all questions were addressed.   Disposition: Status is: Inpatient Remains inpatient  appropriate because: pending CIR   Planned Discharge Destination: Rehab  Author: Tawni Millers, MD 11/07/2022 5:33 PM  For on call review [email or web address].

## 2022-11-07 NOTE — Hospital Course (Signed)
Leslie [name] was admitted to the hospital with the working diagnosis of community acquired pneumonia complicated with uncontrolled atrial fibrillation. Hospitalization complicated by acute ischemic CVA, sp mechanical thrombectomy.   81 yo female with the past medical history of hypothyroid and GERD who presented with palpitations and dyspnea along with wheezing. Positive cough, that prompted a visit to her primary care provider who prescribed antibiotics and steroids. Unfortunately she continue to have symptoms, at a follow up at the PCP office, she was found in atrial fibrillation with RVR, she was referred to the ED for further evaluation. On her initial physical examination her blood pressure was 124/90, HR 106, RR 18 and 02 saturation 95%, lungs with no wheezing or rales, heart with S1 and S2 present, irregularly irregular, abdomen with no distention, and no lower extremity edema.   Na 141, K 4,0 Cl 110, bicarbonate 20 glucose 123, bun 24 cr 1,0 Mg 2,2  High sensitive troponin 13 and 11  Wbc 13.3 hgb 10,7 plt 333 Sars covid 19 negative   Chest radiograph with mild cardiomegaly, bilateral hilar vascular congestion, nodular dense infiltrates right lower and left lower lobe.  Ct chest with patchy dense infiltrated at the right lower lobe with right pleural effusion.  No pulmonary embolism.   EKG 153 bpm, normal axis, not prolonged qtc, atrial fibrillation rhythm, with no significant ST segment or T wave changes.   Patient was placed on antibiotic therapy.  Right thoracentesis (exudate).  AV blockade and anticoagulation for atrial fibrillation.   11/25 she developed right gaze deviation, and left hemiparesis, she was diagnosed with acute right M1 embolic occlusion and underwent emergent mechanical thrombectomy.  Required intensive care monitoring.   11/28 transfer to [facility]. Pending transfer to inpatient rehabilitation.

## 2022-11-07 NOTE — Assessment & Plan Note (Signed)
Continue with levothyroxine  Pre diabetes with Hgb A1c 6,2

## 2022-11-07 NOTE — Progress Notes (Signed)
Inpatient Rehabilitation Admissions Coordinator   I met at bedside with patient and daughter. I await insurance approval for possible [facility] admit.  Danne Baxter, RN, MSN Rehab Admissions Coordinator [phone] 11/07/2022 12:25 PM

## 2022-11-07 NOTE — Plan of Care (Signed)
Pt ambulated in hallways with physical therapy this morning. Pt stood at sink and brushed teeth with occupational therapy this evening. New blisters found on bilateral buttocks; wound consult placed; new sacral foam placed over sites.  Problem: Education: Goal: Knowledge of General Education information will improve Description: Including pain rating scale, medication(s)/side effects and non-pharmacologic comfort measures Outcome: Progressing   Problem: Health Behavior/Discharge Planning: Goal: Ability to manage health-related needs will improve Outcome: Progressing   Problem: Clinical Measurements: Goal: Ability to maintain clinical measurements within normal limits will improve Outcome: Progressing Goal: Will remain free from infection Outcome: Progressing Goal: Diagnostic test results will improve Outcome: Progressing Goal: Respiratory complications will improve Outcome: Progressing Goal: Cardiovascular complication will be avoided Outcome: Progressing   Problem: Activity: Goal: Risk for activity intolerance will decrease Outcome: Progressing   Problem: Nutrition: Goal: Adequate nutrition will be maintained Outcome: Progressing   Problem: Coping: Goal: Level of anxiety will decrease Outcome: Progressing   Problem: Elimination: Goal: Will not experience complications related to bowel motility Outcome: Progressing Goal: Will not experience complications related to urinary retention Outcome: Progressing   Problem: Pain Managment: Goal: General experience of comfort will improve Outcome: Progressing   Problem: Safety: Goal: Ability to remain free from injury will improve Outcome: Progressing   Problem: Education: Goal: Knowledge of disease or condition will improve Outcome: Progressing Goal: Knowledge of secondary prevention will improve (MUST DOCUMENT ALL) Outcome: Progressing Goal: Knowledge of patient specific risk factors will improve [place]ta [place] N/A or DELETE if  not current risk factor) Outcome: Progressing   Problem: Ischemic Stroke/TIA Tissue Perfusion: Goal: Complications of ischemic stroke/TIA will be minimized Outcome: Progressing   Problem: Coping: Goal: Will verbalize positive feelings about self Outcome: Progressing Goal: Will identify appropriate support needs Outcome: Progressing   Problem: Health Behavior/Discharge Planning: Goal: Ability to manage health-related needs will improve Outcome: Progressing Goal: Goals will be collaboratively established with patient/family Outcome: Progressing   Problem: Self-Care: Goal: Ability to participate in self-care as condition permits will improve Outcome: Progressing Goal: Verbalization of feelings and concerns over difficulty with self-care will improve Outcome: Progressing Goal: Ability to communicate needs accurately will improve Outcome: Progressing   Problem: Nutrition: Goal: Risk of aspiration will decrease Outcome: Progressing Goal: Dietary intake will improve Outcome: Progressing   Problem: Education: Goal: Understanding of CV disease, CV risk reduction, and recovery process will improve Outcome: Progressing Goal: Individualized Educational Video(s) Outcome: Progressing   Problem: Activity: Goal: Ability to return to baseline activity level will improve Outcome: Progressing   Problem: Cardiovascular: Goal: Ability to achieve and maintain adequate cardiovascular perfusion will improve Outcome: Progressing Goal: Vascular access site(s) Level 0-1 will be maintained Outcome: Progressing   Problem: Health Behavior/Discharge Planning: Goal: Ability to safely manage health-related needs after discharge will improve Outcome: Progressing   Problem: Skin Integrity: Goal: Risk for impaired skin integrity will decrease Outcome: Not Progressing

## 2022-11-08 ENCOUNTER — Other Ambulatory Visit: Payer: Self-pay

## 2022-11-08 ENCOUNTER — Inpatient Hospital Stay (HOSPITAL_COMMUNITY)
Admission: RE | Admit: 2022-11-08 | Discharge: 2022-11-23 | DRG: 056 | Disposition: A | Discharge status: Other Acute Inpatient Hospital | Payer: Medicare HMO | Source: Intra-hospital | Attending: Physical Medicine & Rehabilitation | Admitting: Physical Medicine & Rehabilitation

## 2022-11-08 ENCOUNTER — Encounter (HOSPITAL_COMMUNITY): Payer: Self-pay | Admitting: Neurology

## 2022-11-08 ENCOUNTER — Other Ambulatory Visit (HOSPITAL_COMMUNITY): Payer: Self-pay

## 2022-11-08 ENCOUNTER — Encounter (HOSPITAL_COMMUNITY): Payer: Self-pay | Admitting: Physical Medicine & Rehabilitation

## 2022-11-08 DIAGNOSIS — Z8711 Personal history of peptic ulcer disease: Secondary | ICD-10-CM | POA: Diagnosis not present

## 2022-11-08 DIAGNOSIS — R1312 Dysphagia, oropharyngeal phase: Secondary | ICD-10-CM | POA: Diagnosis present

## 2022-11-08 DIAGNOSIS — J189 Pneumonia, unspecified organism: Secondary | ICD-10-CM | POA: Diagnosis not present

## 2022-11-08 DIAGNOSIS — N189 Chronic kidney disease, unspecified: Secondary | ICD-10-CM | POA: Diagnosis not present

## 2022-11-08 DIAGNOSIS — Z8249 Family history of ischemic heart disease and other diseases of the circulatory system: Secondary | ICD-10-CM | POA: Diagnosis not present

## 2022-11-08 DIAGNOSIS — I6930 Unspecified sequelae of cerebral infarction: Secondary | ICD-10-CM | POA: Diagnosis not present

## 2022-11-08 DIAGNOSIS — I69354 Hemiplegia and hemiparesis following cerebral infarction affecting left non-dominant side: Secondary | ICD-10-CM | POA: Diagnosis present

## 2022-11-08 DIAGNOSIS — E559 Vitamin D deficiency, unspecified: Secondary | ICD-10-CM | POA: Diagnosis present

## 2022-11-08 DIAGNOSIS — I639 Cerebral infarction, unspecified: Secondary | ICD-10-CM | POA: Diagnosis not present

## 2022-11-08 DIAGNOSIS — I69322 Dysarthria following cerebral infarction: Secondary | ICD-10-CM | POA: Diagnosis not present

## 2022-11-08 DIAGNOSIS — Z888 Allergy status to other drugs, medicaments and biological substances status: Secondary | ICD-10-CM

## 2022-11-08 DIAGNOSIS — I4891 Unspecified atrial fibrillation: Secondary | ICD-10-CM | POA: Diagnosis present

## 2022-11-08 DIAGNOSIS — I5022 Chronic systolic (congestive) heart failure: Secondary | ICD-10-CM | POA: Diagnosis not present

## 2022-11-08 DIAGNOSIS — I5023 Acute on chronic systolic (congestive) heart failure: Secondary | ICD-10-CM | POA: Diagnosis not present

## 2022-11-08 DIAGNOSIS — I10 Essential (primary) hypertension: Secondary | ICD-10-CM | POA: Diagnosis not present

## 2022-11-08 DIAGNOSIS — I951 Orthostatic hypotension: Secondary | ICD-10-CM | POA: Diagnosis present

## 2022-11-08 DIAGNOSIS — M1990 Unspecified osteoarthritis, unspecified site: Secondary | ICD-10-CM | POA: Diagnosis present

## 2022-11-08 DIAGNOSIS — Z6822 Body mass index (BMI) 22.0-22.9, adult: Secondary | ICD-10-CM | POA: Diagnosis not present

## 2022-11-08 DIAGNOSIS — K219 Gastro-esophageal reflux disease without esophagitis: Secondary | ICD-10-CM | POA: Diagnosis present

## 2022-11-08 DIAGNOSIS — I69319 Unspecified symptoms and signs involving cognitive functions following cerebral infarction: Secondary | ICD-10-CM

## 2022-11-08 DIAGNOSIS — I480 Paroxysmal atrial fibrillation: Secondary | ICD-10-CM | POA: Diagnosis not present

## 2022-11-08 DIAGNOSIS — E039 Hypothyroidism, unspecified: Secondary | ICD-10-CM | POA: Diagnosis present

## 2022-11-08 DIAGNOSIS — J439 Emphysema, unspecified: Secondary | ICD-10-CM

## 2022-11-08 DIAGNOSIS — L259 Unspecified contact dermatitis, unspecified cause: Secondary | ICD-10-CM | POA: Diagnosis not present

## 2022-11-08 DIAGNOSIS — R4182 Altered mental status, unspecified: Secondary | ICD-10-CM | POA: Diagnosis not present

## 2022-11-08 DIAGNOSIS — I69391 Dysphagia following cerebral infarction: Secondary | ICD-10-CM

## 2022-11-08 DIAGNOSIS — I959 Hypotension, unspecified: Secondary | ICD-10-CM | POA: Insufficient documentation

## 2022-11-08 DIAGNOSIS — I69393 Ataxia following cerebral infarction: Secondary | ICD-10-CM | POA: Diagnosis not present

## 2022-11-08 DIAGNOSIS — I482 Chronic atrial fibrillation, unspecified: Secondary | ICD-10-CM | POA: Diagnosis not present

## 2022-11-08 DIAGNOSIS — N179 Acute kidney failure, unspecified: Secondary | ICD-10-CM | POA: Diagnosis present

## 2022-11-08 DIAGNOSIS — Z8261 Family history of arthritis: Secondary | ICD-10-CM

## 2022-11-08 DIAGNOSIS — I130 Hypertensive heart and chronic kidney disease with heart failure and stage 1 through stage 4 chronic kidney disease, or unspecified chronic kidney disease: Secondary | ICD-10-CM | POA: Diagnosis present

## 2022-11-08 DIAGNOSIS — I63131 Cerebral infarction due to embolism of right carotid artery: Secondary | ICD-10-CM | POA: Diagnosis not present

## 2022-11-08 DIAGNOSIS — E43 Unspecified severe protein-calorie malnutrition: Secondary | ICD-10-CM | POA: Diagnosis present

## 2022-11-08 DIAGNOSIS — J9811 Atelectasis: Secondary | ICD-10-CM | POA: Diagnosis not present

## 2022-11-08 DIAGNOSIS — Z87891 Personal history of nicotine dependence: Secondary | ICD-10-CM

## 2022-11-08 DIAGNOSIS — K5900 Constipation, unspecified: Secondary | ICD-10-CM | POA: Diagnosis not present

## 2022-11-08 DIAGNOSIS — Z885 Allergy status to narcotic agent status: Secondary | ICD-10-CM

## 2022-11-08 DIAGNOSIS — I6601 Occlusion and stenosis of right middle cerebral artery: Secondary | ICD-10-CM | POA: Diagnosis not present

## 2022-11-08 DIAGNOSIS — R404 Transient alteration of awareness: Secondary | ICD-10-CM | POA: Diagnosis not present

## 2022-11-08 DIAGNOSIS — R414 Neurologic neglect syndrome: Secondary | ICD-10-CM | POA: Diagnosis present

## 2022-11-08 DIAGNOSIS — N183 Chronic kidney disease, stage 3 unspecified: Secondary | ICD-10-CM | POA: Diagnosis present

## 2022-11-08 DIAGNOSIS — I5030 Unspecified diastolic (congestive) heart failure: Secondary | ICD-10-CM | POA: Diagnosis present

## 2022-11-08 DIAGNOSIS — I69311 Memory deficit following cerebral infarction: Secondary | ICD-10-CM | POA: Diagnosis not present

## 2022-11-08 DIAGNOSIS — I509 Heart failure, unspecified: Secondary | ICD-10-CM | POA: Diagnosis not present

## 2022-11-08 DIAGNOSIS — Z981 Arthrodesis status: Secondary | ICD-10-CM

## 2022-11-08 DIAGNOSIS — Z8701 Personal history of pneumonia (recurrent): Secondary | ICD-10-CM

## 2022-11-08 DIAGNOSIS — Z515 Encounter for palliative care: Secondary | ICD-10-CM | POA: Diagnosis not present

## 2022-11-08 DIAGNOSIS — Z9181 History of falling: Secondary | ICD-10-CM

## 2022-11-08 DIAGNOSIS — I5020 Unspecified systolic (congestive) heart failure: Secondary | ICD-10-CM | POA: Diagnosis not present

## 2022-11-08 DIAGNOSIS — Z7189 Other specified counseling: Secondary | ICD-10-CM | POA: Diagnosis not present

## 2022-11-08 DIAGNOSIS — Z90710 Acquired absence of both cervix and uterus: Secondary | ICD-10-CM

## 2022-11-08 DIAGNOSIS — Z96642 Presence of left artificial hip joint: Secondary | ICD-10-CM | POA: Diagnosis present

## 2022-11-08 DIAGNOSIS — Z79899 Other long term (current) drug therapy: Secondary | ICD-10-CM

## 2022-11-08 DIAGNOSIS — I340 Nonrheumatic mitral (valve) insufficiency: Secondary | ICD-10-CM | POA: Diagnosis not present

## 2022-11-08 DIAGNOSIS — Z79890 Hormone replacement therapy (postmenopausal): Secondary | ICD-10-CM

## 2022-11-08 DIAGNOSIS — Z886 Allergy status to analgesic agent status: Secondary | ICD-10-CM

## 2022-11-08 DIAGNOSIS — R110 Nausea: Secondary | ICD-10-CM | POA: Diagnosis present

## 2022-11-08 LAB — BASIC METABOLIC PANEL
Anion gap: 10 (ref 5–15)
BUN: 17 mg/dL (ref 8–23)
CO2: 26 mmol/L (ref 22–32)
Calcium: 9.2 mg/dL (ref 8.9–10.3)
Chloride: 103 mmol/L (ref 98–111)
Creatinine, Ser: 1.13 mg/dL — ABNORMAL HIGH (ref 0.44–1.00)
GFR, Estimated: 49 mL/min — ABNORMAL LOW (ref >60)
Glucose, Bld: 105 mg/dL — ABNORMAL HIGH (ref 70–99)
Potassium: 3.7 mmol/L (ref 3.5–5.1)
Sodium: 139 mmol/L (ref 135–145)

## 2022-11-08 LAB — CBC
HCT: 40.8 % (ref 36.0–46.0)
Hemoglobin: 13.1 g/dL (ref 12.0–15.0)
MCH: 28.2 pg (ref 26.0–34.0)
MCHC: 32.1 g/dL (ref 30.0–36.0)
MCV: 87.9 fL (ref 80.0–100.0)
Platelets: 312 10*3/uL (ref 150–400)
RBC: 4.64 MIL/uL (ref 3.87–5.11)
RDW: 15.2 % (ref 11.5–15.5)
WBC: 14.2 10*3/uL — ABNORMAL HIGH (ref 4.0–10.5)
nRBC: 0 % (ref 0.0–0.2)

## 2022-11-08 MED ORDER — SPIRONOLACTONE 25 MG PO TABS: 25.0000 mg | ORAL_TABLET | Freq: Every day | ORAL | Status: DC

## 2022-11-08 MED ORDER — PROCHLORPERAZINE 25 MG RE SUPP: 12.5000 mg | Freq: Four times a day (QID) | RECTAL | Status: DC | PRN

## 2022-11-08 MED ORDER — SPIRONOLACTONE 25 MG PO TABS
25.0000 mg | ORAL_TABLET | Freq: Every day | ORAL | 0 refills | Status: DC
  Filled 2022-11-08: qty 30, 30d supply, fill #0

## 2022-11-08 MED ORDER — DILTIAZEM HCL ER COATED BEADS 180 MG PO CP24
360.0000 mg | ORAL_CAPSULE | Freq: Every day | ORAL | Status: DC
  Administered 2022-11-09 – 2022-11-14 (×6): 360 mg via ORAL
  Filled 2022-11-08 (×6): qty 2

## 2022-11-08 MED ORDER — FAMOTIDINE 20 MG PO TABS
20.0000 mg | ORAL_TABLET | Freq: Every day | ORAL | Status: DC
  Administered 2022-11-09 – 2022-11-23 (×15): 20 mg via ORAL
  Filled 2022-11-08 (×15): qty 1

## 2022-11-08 MED ORDER — MELATONIN 5 MG PO TABS
5.0000 mg | ORAL_TABLET | Freq: Every evening | ORAL | Status: DC | PRN
  Administered 2022-11-09 – 2022-11-20 (×9): 5 mg via ORAL
  Filled 2022-11-08 (×9): qty 1

## 2022-11-08 MED ORDER — ORAL CARE MOUTH RINSE
15.0000 mL | OROMUCOSAL | Status: DC
  Administered 2022-11-08 – 2022-11-23 (×56): 15 mL via OROMUCOSAL

## 2022-11-08 MED ORDER — PROCHLORPERAZINE MALEATE 5 MG PO TABS
5.0000 mg | ORAL_TABLET | Freq: Four times a day (QID) | ORAL | Status: DC | PRN
  Administered 2022-11-11 – 2022-11-20 (×4): 10 mg via ORAL
  Filled 2022-11-08 (×4): qty 2

## 2022-11-08 MED ORDER — SPIRONOLACTONE 25 MG PO TABS
25.0000 mg | ORAL_TABLET | Freq: Every day | ORAL | Status: DC
  Administered 2022-11-09 – 2022-11-12 (×4): 25 mg via ORAL
  Filled 2022-11-08 (×4): qty 1

## 2022-11-08 MED ORDER — ORAL CARE MOUTH RINSE: 15.0000 mL | OROMUCOSAL | Status: DC | PRN

## 2022-11-08 MED ORDER — POLYETHYLENE GLYCOL 3350 17 G PO PACK
17.0000 g | PACK | Freq: Every day | ORAL | 0 refills | Status: DC | PRN
  Filled 2022-11-08: qty 14, 14d supply, fill #0

## 2022-11-08 MED ORDER — VITAMIN B-12 1000 MCG PO TABS
1000.0000 ug | ORAL_TABLET | Freq: Every day | ORAL | Status: DC
  Administered 2022-11-09 – 2022-11-23 (×15): 1000 ug via ORAL
  Filled 2022-11-08 (×15): qty 1

## 2022-11-08 MED ORDER — TRAZODONE HCL 50 MG PO TABS: 25.0000 mg | ORAL_TABLET | Freq: Every evening | ORAL | Status: DC | PRN

## 2022-11-08 MED ORDER — PROCHLORPERAZINE EDISYLATE 10 MG/2ML IJ SOLN
5.0000 mg | Freq: Four times a day (QID) | INTRAMUSCULAR | Status: DC | PRN
  Administered 2022-11-21: 10 mg via INTRAMUSCULAR
  Filled 2022-11-08: qty 2

## 2022-11-08 MED ORDER — CALCIUM CARBONATE ANTACID 500 MG PO CHEW: 1.0000 | CHEWABLE_TABLET | Freq: Three times a day (TID) | ORAL | Status: DC | PRN

## 2022-11-08 MED ORDER — APIXABAN 5 MG PO TABS
5.0000 mg | ORAL_TABLET | Freq: Two times a day (BID) | ORAL | Status: DC
  Administered 2022-11-08 – 2022-11-23 (×30): 5 mg via ORAL
  Filled 2022-11-08 (×30): qty 1

## 2022-11-08 MED ORDER — BISACODYL 10 MG RE SUPP
10.0000 mg | Freq: Every day | RECTAL | Status: DC | PRN
  Administered 2022-11-09 – 2022-11-13 (×2): 10 mg via RECTAL
  Filled 2022-11-08 (×2): qty 1

## 2022-11-08 MED ORDER — ALUM & MAG HYDROXIDE-SIMETH 200-200-20 MG/5ML PO SUSP: 30.0000 mL | ORAL | Status: DC | PRN

## 2022-11-08 MED ORDER — ROSUVASTATIN CALCIUM 5 MG PO TABS
10.0000 mg | ORAL_TABLET | Freq: Every day | ORAL | Status: DC
  Administered 2022-11-09 – 2022-11-23 (×15): 10 mg via ORAL
  Filled 2022-11-08 (×15): qty 2

## 2022-11-08 MED ORDER — DILTIAZEM HCL ER COATED BEADS 360 MG PO CP24
360.0000 mg | ORAL_CAPSULE | Freq: Every day | ORAL | 0 refills | Status: DC
  Filled 2022-11-08: qty 30, 30d supply, fill #0

## 2022-11-08 MED ORDER — GUAIFENESIN 100 MG/5ML PO LIQD: 5.0000 mL | ORAL | Status: DC | PRN

## 2022-11-08 MED ORDER — IPRATROPIUM-ALBUTEROL 0.5-2.5 (3) MG/3ML IN SOLN: 3.0000 mL | RESPIRATORY_TRACT | Status: DC | PRN

## 2022-11-08 MED ORDER — SENNOSIDES-DOCUSATE SODIUM 8.6-50 MG PO TABS
2.0000 | ORAL_TABLET | Freq: Every day | ORAL | Status: DC
  Administered 2022-11-08: 2 via ORAL
  Filled 2022-11-08: qty 2

## 2022-11-08 MED ORDER — LEVOTHYROXINE SODIUM 88 MCG PO TABS
88.0000 ug | ORAL_TABLET | Freq: Every day | ORAL | Status: DC
  Administered 2022-11-09 – 2022-11-23 (×15): 88 ug via ORAL
  Filled 2022-11-08 (×15): qty 1

## 2022-11-08 MED ORDER — FLEET ENEMA 7-19 GM/118ML RE ENEM: 1.0000 | ENEMA | Freq: Once | RECTAL | Status: DC | PRN

## 2022-11-08 MED ORDER — ROSUVASTATIN CALCIUM 10 MG PO TABS
10.0000 mg | ORAL_TABLET | Freq: Every day | ORAL | 0 refills | Status: DC
  Filled 2022-11-08: qty 30, 30d supply, fill #0

## 2022-11-08 MED ORDER — ACETAMINOPHEN 325 MG PO TABS: 325.0000 mg | ORAL_TABLET | ORAL | Status: DC | PRN

## 2022-11-08 MED ORDER — SENNOSIDES-DOCUSATE SODIUM 8.6-50 MG PO TABS
1.0000 | ORAL_TABLET | Freq: Two times a day (BID) | ORAL | Status: DC
  Administered 2022-11-09: 1 via ORAL
  Filled 2022-11-08 (×2): qty 1

## 2022-11-08 MED ORDER — APIXABAN 5 MG PO TABS
5.0000 mg | ORAL_TABLET | Freq: Two times a day (BID) | ORAL | 0 refills | Status: DC
  Filled 2022-11-08: qty 60, 30d supply, fill #0

## 2022-11-08 MED ORDER — GUAIFENESIN-DM 100-10 MG/5ML PO SYRP: 5.0000 mL | ORAL_SOLUTION | Freq: Four times a day (QID) | ORAL | Status: DC | PRN

## 2022-11-08 NOTE — Discharge Summary (Signed)
Physician Discharge Summary   Patient: Leslie [name] MRN: [id number] DOB: 09/16/1941  Admit date:     10/29/2022  Discharge date: 11/08/22  Discharge Physician: Jimmy Picket Samanvitha Germany   PCP: Sonia Side., FNP   Recommendations at discharge:    Patient has been placed on anticoagulation for atrial fibrillation Heart rate control with diltiazem Heart failure management with spironolactone, further medical therapy as outpatient, as blood pressure allows.  Follow up renal function and electrolytes in 7 days.  Continue speech, PT and OT   Discharge Diagnoses: Principal Problem:   Atrial fibrillation with rapid ventricular response ([name]) Active Problems:   Community acquired pneumonia of left lower lobe of lung   Middle cerebral artery embolism, right   Acute on chronic systolic CHF (congestive heart failure) ([name])   COPD (chronic obstructive pulmonary disease) ([name])   Hypothyroidism  Resolved Problems:   * No resolved hospital problems. [facility] Course: Mrs. Hargraves was admitted to the hospital with the working diagnosis of community acquired pneumonia and atrial fibrillation. Hospitalization complicated by acute ischemic CVA, now sp mechanical thrombectomy.   81 yo female with the past medical history of hypothyroid and GERD who presented with palpitations and dyspnea along with wheezing. Positive cough, that prompted a visit to her primary care provider who prescribed antibiotics and steroids. Unfortunately she continue to have symptoms, at a follow up with her primary care, she was found in atrial fibrillation with RVR, she was referred to the ED for further evaluation. On her initial physical examination her blood pressure was 124/90, HR 106, RR 18 and 02 saturation 95%, lungs with no wheezing or rales, heart with S1 and S2 present, irregularly irregular, abdomen with no distention, and no lower extremity edema.   Na 141, K 4,0 Cl 110, bicarbonate 20 glucose 123,  bun 24 cr 1,0 Mg 2,2  High sensitive troponin 13 and 11  Wbc 13.3 hgb 10,7 plt 333 Sars covid 19 negative   Chest radiograph with mild cardiomegaly, bilateral hilar vascular congestion, nodular dense infiltrates right lower and left lower lobe.  Ct chest with patchy dense infiltrated at the right lower lobe with right pleural effusion.  No pulmonary embolism.   EKG 153 bpm, normal axis, not prolonged qtc, atrial fibrillation rhythm, with no significant ST segment or T wave changes.   Patient was placed on antibiotic therapy.  Right thoracentesis (exudate).  AV blockade and anticoagulation for atrial fibrillation.   11/25 she developed right gaze deviation, and left hemiparesis, she was diagnosed with acute right M1 embolic occlusion and underwent emergent mechanical thrombectomy.  Required intensive care monitoring.   11/28 transfer to [facility]. Pending transfer to inpatient rehabilitation.  11/30 Patient continue to be medically stable, she has been approved for inpatient rehab.     Assessment and Plan: * Atrial fibrillation with rapid ventricular response ([name]) Patient has been placed on AV blockade with diltiazem with appropriate rate control.   Continue anticoagulation with apixaban   Community acquired pneumonia of left lower lobe of lung Patient had antibiotic therapy with good toleration Right pleural effusion, parapneuonic with no complication.  She had thoracentesis on 11/21, culture and gram stain negative. Exudate per Light's criteria.   Continue oxymetry monitoring   Middle cerebral artery embolism, right Acute CVA, left sided hemiparesis, that is improving with physical and occupational therapy. Swallow dysfunction, currently on dysphagia 3 diet.    Statin therapy and anticoagulation with apixaban. Continue Pt and Ot, follow up with inpatient rehab.  Acute on chronic systolic CHF (congestive heart failure) ([place]) Patient with improvement of volume status at the  time of her discharge. Echocardiogram showed reduced LV systolic function EF 40 to 45%, with global hypokinesis. Moderate asymmetric left ventricular hypertrophy of the basal septal segment. RV systolic function preserved. Moderate to severe mitral regurgitation.   Plan to continue rate control with diltiazem Continue diuretic therapy with spironolactone and continue with furosemide only as needed.  Hold on ARB due to risk of hypotension. She had orthostatic hypotension this morning with physical therapy.   COPD (chronic obstructive pulmonary disease) ([name]) No acute exacerbation   Hypothyroidism Continue with levothyroxine  Pre diabetes with Hgb A1c 6,2          Consultants: cardiology, neurology, interventional radiology  Procedures performed: mechanical thrombectomy which restored TICI 3 flow.   Disposition:  inpatient rehab  Diet recommendation:  Cardiac and Carb modified diet DISCHARGE MEDICATION: Allergies as of 11/08/2022       Reactions   Duloxetine Nausea Only   Voltaren [diclofenac Sodium] Shortness Of Breath, Palpitations   Lisinopril Other (See Comments)   Aspirin Other (See Comments)   ulcers   Diphenhydramine Other (See Comments)   Gabapentin Itching   Metoprolol    SOB   Ultram [tramadol Hcl] Other (See Comments)   Effects cognitive abilities    Proton Pump Inhibitors Rash   Itching, rash        Medication List     STOP taking these medications    COLLAGEN PO   cyclobenzaprine 10 MG tablet Commonly known as: FLEXERIL   SYSTANE OP   Vicks Vaporizer Scent Pads   Vitamin D 50 MCG (2000 UT) tablet       TAKE these medications    acetaminophen 325 MG tablet Commonly known as: TYLENOL Take 1-2 tablets (325-650 mg total) by mouth every 6 (six) hours as needed for mild pain (pain score 1-3 or temp > 100.5). What changed: how much to take   apixaban 5 MG Tabs tablet Commonly known as: ELIQUIS Take 1 tablet (5 mg total) by mouth 2 (two)  times daily.   diltiazem 360 MG 24 hr capsule Commonly known as: CARDIZEM CD Take 1 capsule (360 mg total) by mouth daily. Start taking on: November 09, 2022   famotidine 20 MG tablet Commonly known as: PEPCID Take 20 mg by mouth daily as needed for indigestion or heartburn.   levothyroxine 88 MCG tablet Commonly known as: SYNTHROID Take 88 mcg by mouth daily before breakfast.   polyethylene glycol 17 g packet Commonly known as: MIRALAX / GLYCOLAX Take 17 g by mouth daily as needed for severe constipation, moderate constipation or mild constipation.   rosuvastatin 10 MG tablet Commonly known as: CRESTOR Take 1 tablet (10 mg total) by mouth daily. Start taking on: November 09, 2022   senna 8.6 MG Tabs tablet Commonly known as: SENOKOT Take 1 tablet (8.6 mg total) by mouth 2 (two) times daily. What changed:  when to take this reasons to take this   spironolactone 25 MG tablet Commonly known as: ALDACTONE Take 1 tablet (25 mg total) by mouth daily. Start taking on: November 09, 2022        Follow-up Information     Star Age, MD. Schedule an appointment as soon as possible for a visit in 1 month(s).   Specialties: Neurology, Radiology Contact information: [address] [place] [address] [phone]  Discharge Exam: Filed Weights   11/03/22 2100 11/04/22 0400 11/07/22 0609  Weight: 64.1 kg 64.1 kg 72 kg   BP 116/70 (BP Location: Left Arm)   Pulse 98   Temp (!) 97.5 F (36.4 C) (Oral)   Resp (!) 22   Ht 5' 7.5" (1.715 m)   Wt 72 kg   SpO2 97%   BMI 24.49 kg/m   Patient with no chest pain or dyspnea, had dizziness when standing today with PT  Neurology awake and alert ENT with mild pallor Cardiovascular with S1 and S2 present. Irregularly irregular with no gallops, rubs or murmurs Respiratory with no rales or wheezing Abdomen with no distention  No lower extremity edema   Condition at discharge:  stable  The results of significant diagnostics from this hospitalization (including imaging, microbiology, ancillary and laboratory) are listed below for reference.   Imaging Studies: IR PERCUTANEOUS ART THROMBECTOMY/INFUSION INTRACRANIAL INC DIAG ANGIO  Result Date: 11/07/2022 INDICATION: New onset right gaze deviation, and left-sided neglect. Occluded right middle cerebral artery on CT angiogram of the head and neck. EXAM: 1. EMERGENT LARGE VESSEL OCCLUSION THROMBOLYSIS (anterior CIRCULATION) COMPARISON:  CT angiogram of the head and neck November 03, 2022. MEDICATIONS: Ancef 2 g IV antibiotic was administered within 1 hour of the procedure. ANESTHESIA/SEDATION: General anesthesia. CONTRAST:  Omnipaque 300 100 mL. FLUOROSCOPY TIME:  Fluoroscopy Time: 28 minutes 336 seconds (1118 mGy). COMPLICATIONS: None immediate. TECHNIQUE: Following a full explanation of the procedure along with the potential associated complications, an informed witnessed consent was obtained. The risks of intracranial hemorrhage of 10%, worsening neurological deficit, ventilator dependency, death and inability to revascularize were all reviewed in detail with the patient's family. The patient was then put under general anesthesia by the Department of Anesthesiology at [facility]. The right groin was prepped and draped in the usual sterile fashion. Thereafter using modified Seldinger technique, transfemoral access into the right common femoral artery was obtained without difficulty. Over a 0.035 inch guidewire an 8 [place] 15 cm [place] Pinnacle sheath was inserted. Through this, and also over an 0.035 inch guidewire a combination of a 5.5 [place] Simmons 2 support catheter inside of an 087 balloon guide catheter was advanced over an 035 inch guidewire to the aortic arch region, and selectively positioned in the innominate artery, and in the right common carotid artery. FINDINGS: A control arteriogram performed through the  innominate artery demonstrates patency of the right subclavian artery and the right vertebral artery to the cranial skull base. Moderate FMD changes are evident involving the right vertebral artery at the level of C1. Distal flow is noted into the proximal basilar artery. The right common carotid arteriogram demonstrates the right external carotid artery and its major branches to be widely patent. The right internal carotid artery at the bulb to the cranial skull base is widely patent. The balloon guide and support catheter were then advanced to the distal right internal carotid artery cervical segment. The guidewire was removed. Good aspiration was obtained from the hub of the balloon guide catheter after removal of the support catheter as well. Moderate spasm responded to proximal retrieval of the balloon guide catheter in 5 mcg of nitroglycerin intra-arterially. A control arteriogram performed through the balloon guide catheter demonstrates patency of the petrous, cavernous, supraclinoid right ICA. 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 The right anterior cerebral artery opacifies into the capillary and venous phases. Excellent cross-filling via the anterior communicating artery of the  left anterior cerebral artery A1 A2 segments, and the left middle cerebral artery distribution is evident. PROCEDURE: Through the balloon guide catheter in the distal right internal carotid artery a combination of an 021 160 cm microcatheter inside of an 071 132 cm Zoom aspiration catheter was advanced to the supraclinoid right ICA. Using a torque device, access into the occluded middle cerebral artery M2 M3 region of the inferior division was obtained followed by microcatheter. The guidewire was removed. Good aspiration obtained from the hub of the microcatheter, which was connected to continuous heparinized saline infusion. The Zoom aspiration catheter was advanced and engaged into the  distal M1 segment of the right middle cerebral artery. Aspiration was then applied at the hub of the Zoom aspiration catheter using a Penumbra aspiration device following the deployment of a 4 mm x 40 mm Solitaire X retrieval device in the usual manner. Aspiration at the hub of the balloon guide catheter with proximal flow arrest in the distal right internal carotid artery. After 2 minutes, the combination of the retrieval device, the 021 Zoom aspiration catheter, and the micro guidewire were retrieved and removed. Following reversal of flow arrest, a control arteriogram through the balloon guide catheter in the distal right internal carotid artery demonstrated complete revascularization of the right middle cerebral artery distribution achieving a TICI 3 revascularization. Patency was maintained of the right anterior cerebral artery A2 segment and distally. A small focal dissection was seen in the distal right internal carotid artery secondary to the inflated balloon. This completely seen on the subsequent arteriograms. The balloon guide catheter was then retrieved into the right common carotid artery. A control arteriogram performed through this demonstrated the right internal carotid artery intracranially and extra cranially to be widely patent. The right middle cerebral artery and the right anterior cerebral artery remained widely patent without evidence of occlusions, or of intraluminal filling defects. A diagnostic JB 1 catheter was then advanced over an 035 inch guidewire to the aortic arch region, and positioned in the mid left common carotid artery. The guidewire was removed. Good aspiration obtained from the hub of the diagnostic catheter. An arteriogram performed through this demonstrated the left external carotid artery to be mildly narrowed at its origin. Its branches remained patent. The left internal carotid artery at the bulb to the cranial skull base demonstrates wide patency. The petrous, the  cavernous and the supraclinoid ICA demonstrate wide patency. The left middle cerebral artery and the left anterior cerebral artery opacify into the capillary and venous phases. Prompt cross-filling via the anterior communicating artery of the right anterior cerebral A2 segment and distally was noted. The 7 French Angio-Seal closure device was then deployed at the right groin puncture site associated with manual compression with quick clot for 25 minutes for hemostasis. Distal pulses remained unchanged from prior to the procedure. CT of the brain demonstrated no evidence of intracranial hemorrhage. Patient was extubated. The patient followed simple commands appropriately. Patient was able to bend her left leg against gravity. No significant movement was evident in the left upper extremity. She was then transferred to PACU and subsequently to post revascularization care. IMPRESSION: Status post complete revascularization of the right middle cerebral artery distal M1 segment with 1 pass with a 4 mm x 40 mm Solitaire X retrieval device, and contact aspiration achieving a TICI 3 revascularization. PLAN: As per referring MD. Electronically Signed   By: Luanne Bras [name]   On: 11/07/2022 08:06   IR ANGIO EXTRACRAN SEL COM CAROTID  INNOMINATE UNI L MOD SED  Result Date: 11/07/2022 INDICATION: New onset right gaze deviation, and left-sided neglect. Occluded right middle cerebral artery on CT angiogram of the head and neck. EXAM: 1. EMERGENT LARGE VESSEL OCCLUSION THROMBOLYSIS (anterior CIRCULATION) COMPARISON:  CT angiogram of the head and neck November 03, 2022. MEDICATIONS: Ancef 2 g IV antibiotic was administered within 1 hour of the procedure. ANESTHESIA/SEDATION: General anesthesia. CONTRAST:  Omnipaque 300 100 mL. FLUOROSCOPY TIME:  Fluoroscopy Time: 28 minutes 336 seconds (1118 mGy). COMPLICATIONS: None immediate. TECHNIQUE: Following a full explanation of the procedure along with the potential associated  complications, an informed witnessed consent was obtained. The risks of intracranial hemorrhage of 10%, worsening neurological deficit, ventilator dependency, death and inability to revascularize were all reviewed in detail with the patient's family. The patient was then put under general anesthesia by the Department of Anesthesiology at [facility]. The right groin was prepped and draped in the usual sterile fashion. Thereafter using modified Seldinger technique, transfemoral access into the right common femoral artery was obtained without difficulty. Over a 0.035 inch guidewire an 8 [place] 15 cm [place] Pinnacle sheath was inserted. Through this, and also over an 0.035 inch guidewire a combination of a 5.5 [place] Simmons 2 support catheter inside of an 087 balloon guide catheter was advanced over an 035 inch guidewire to the aortic arch region, and selectively positioned in the innominate artery, and in the right common carotid artery. FINDINGS: A control arteriogram performed through the innominate artery demonstrates patency of the right subclavian artery and the right vertebral artery to the cranial skull base. Moderate FMD changes are evident involving the right vertebral artery at the level of C1. Distal flow is noted into the proximal basilar artery. The right common carotid arteriogram demonstrates the right external carotid artery and its major branches to be widely patent. The right internal carotid artery at the bulb to the cranial skull base is widely patent. The balloon guide and support catheter were then advanced to the distal right internal carotid artery cervical segment. The guidewire was removed. Good aspiration was obtained from the hub of the balloon guide catheter after removal of the support catheter as well. Moderate spasm responded to proximal retrieval of the balloon guide catheter in 5 mcg of nitroglycerin intra-arterially. A control arteriogram performed through the balloon guide  catheter demonstrates patency of the petrous, cavernous, supraclinoid right ICA. 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 The right anterior cerebral artery opacifies into the capillary and venous phases. Excellent cross-filling via the anterior communicating artery of the left anterior cerebral artery A1 A2 segments, and the left middle cerebral artery distribution is evident. PROCEDURE: Through the balloon guide catheter in the distal right internal carotid artery a combination of an 021 160 cm microcatheter inside of an 071 132 cm Zoom aspiration catheter was advanced to the supraclinoid right ICA. Using a torque device, access into the occluded middle cerebral artery M2 M3 region of the inferior division was obtained followed by microcatheter. The guidewire was removed. Good aspiration obtained from the hub of the microcatheter, which was connected to continuous heparinized saline infusion. The Zoom aspiration catheter was advanced and engaged into the distal M1 segment of the right middle cerebral artery. Aspiration was then applied at the hub of the Zoom aspiration catheter using a Penumbra aspiration device following the deployment of a 4 mm x 40 mm Solitaire X retrieval device in the usual manner. Aspiration at the  hub of the balloon guide catheter with proximal flow arrest in the distal right internal carotid artery. After 2 minutes, the combination of the retrieval device, the 021 Zoom aspiration catheter, and the micro guidewire were retrieved and removed. Following reversal of flow arrest, a control arteriogram through the balloon guide catheter in the distal right internal carotid artery demonstrated complete revascularization of the right middle cerebral artery distribution achieving a TICI 3 revascularization. Patency was maintained of the right anterior cerebral artery A2 segment and distally. A small focal dissection was seen in the distal right internal  carotid artery secondary to the inflated balloon. This completely seen on the subsequent arteriograms. The balloon guide catheter was then retrieved into the right common carotid artery. A control arteriogram performed through this demonstrated the right internal carotid artery intracranially and extra cranially to be widely patent. The right middle cerebral artery and the right anterior cerebral artery remained widely patent without evidence of occlusions, or of intraluminal filling defects. A diagnostic JB 1 catheter was then advanced over an 035 inch guidewire to the aortic arch region, and positioned in the mid left common carotid artery. The guidewire was removed. Good aspiration obtained from the hub of the diagnostic catheter. An arteriogram performed through this demonstrated the left external carotid artery to be mildly narrowed at its origin. Its branches remained patent. The left internal carotid artery at the bulb to the cranial skull base demonstrates wide patency. The petrous, the cavernous and the supraclinoid ICA demonstrate wide patency. The left middle cerebral artery and the left anterior cerebral artery opacify into the capillary and venous phases. Prompt cross-filling via the anterior communicating artery of the right anterior cerebral A2 segment and distally was noted. The 7 French Angio-Seal closure device was then deployed at the right groin puncture site associated with manual compression with quick clot for 25 minutes for hemostasis. Distal pulses remained unchanged from prior to the procedure. CT of the brain demonstrated no evidence of intracranial hemorrhage. Patient was extubated. The patient followed simple commands appropriately. Patient was able to bend her left leg against gravity. No significant movement was evident in the left upper extremity. She was then transferred to PACU and subsequently to post revascularization care. IMPRESSION: Status post complete revascularization of the  right middle cerebral artery distal M1 segment with 1 pass with a 4 mm x 40 mm Solitaire X retrieval device, and contact aspiration achieving a TICI 3 revascularization. PLAN: As per referring MD. Electronically Signed   By: Luanne Bras [name]   On: 11/07/2022 08:06   IR CT Head Ltd  Result Date: 11/07/2022 INDICATION: New onset right gaze deviation, and left-sided neglect. Occluded right middle cerebral artery on CT angiogram of the head and neck. EXAM: 1. EMERGENT LARGE VESSEL OCCLUSION THROMBOLYSIS (anterior CIRCULATION) COMPARISON:  CT angiogram of the head and neck November 03, 2022. MEDICATIONS: Ancef 2 g IV antibiotic was administered within 1 hour of the procedure. ANESTHESIA/SEDATION: General anesthesia. CONTRAST:  Omnipaque 300 100 mL. FLUOROSCOPY TIME:  Fluoroscopy Time: 28 minutes 336 seconds (1118 mGy). COMPLICATIONS: None immediate. TECHNIQUE: Following a full explanation of the procedure along with the potential associated complications, an informed witnessed consent was obtained. The risks of intracranial hemorrhage of 10%, worsening neurological deficit, ventilator dependency, death and inability to revascularize were all reviewed in detail with the patient's family. The patient was then put under general anesthesia by the Department of Anesthesiology at [facility]. The right groin was prepped and draped in the  usual sterile fashion. Thereafter using modified Seldinger technique, transfemoral access into the right common femoral artery was obtained without difficulty. Over a 0.035 inch guidewire an 8 [place] 15 cm [place] Pinnacle sheath was inserted. Through this, and also over an 0.035 inch guidewire a combination of a 5.5 [place] Simmons 2 support catheter inside of an 087 balloon guide catheter was advanced over an 035 inch guidewire to the aortic arch region, and selectively positioned in the innominate artery, and in the right common carotid artery. FINDINGS: A control  arteriogram performed through the innominate artery demonstrates patency of the right subclavian artery and the right vertebral artery to the cranial skull base. Moderate FMD changes are evident involving the right vertebral artery at the level of C1. Distal flow is noted into the proximal basilar artery. The right common carotid arteriogram demonstrates the right external carotid artery and its major branches to be widely patent. The right internal carotid artery at the bulb to the cranial skull base is widely patent. The balloon guide and support catheter were then advanced to the distal right internal carotid artery cervical segment. The guidewire was removed. Good aspiration was obtained from the hub of the balloon guide catheter after removal of the support catheter as well. Moderate spasm responded to proximal retrieval of the balloon guide catheter in 5 mcg of nitroglycerin intra-arterially. A control arteriogram performed through the balloon guide catheter demonstrates patency of the petrous, cavernous, supraclinoid right ICA. 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 The right anterior cerebral artery opacifies into the capillary and venous phases. Excellent cross-filling via the anterior communicating artery of the left anterior cerebral artery A1 A2 segments, and the left middle cerebral artery distribution is evident. PROCEDURE: Through the balloon guide catheter in the distal right internal carotid artery a combination of an 021 160 cm microcatheter inside of an 071 132 cm Zoom aspiration catheter was advanced to the supraclinoid right ICA. Using a torque device, access into the occluded middle cerebral artery M2 M3 region of the inferior division was obtained followed by microcatheter. The guidewire was removed. Good aspiration obtained from the hub of the microcatheter, which was connected to continuous heparinized saline infusion. The Zoom aspiration catheter  was advanced and engaged into the distal M1 segment of the right middle cerebral artery. Aspiration was then applied at the hub of the Zoom aspiration catheter using a Penumbra aspiration device following the deployment of a 4 mm x 40 mm Solitaire X retrieval device in the usual manner. Aspiration at the hub of the balloon guide catheter with proximal flow arrest in the distal right internal carotid artery. After 2 minutes, the combination of the retrieval device, the 021 Zoom aspiration catheter, and the micro guidewire were retrieved and removed. Following reversal of flow arrest, a control arteriogram through the balloon guide catheter in the distal right internal carotid artery demonstrated complete revascularization of the right middle cerebral artery distribution achieving a TICI 3 revascularization. Patency was maintained of the right anterior cerebral artery A2 segment and distally. A small focal dissection was seen in the distal right internal carotid artery secondary to the inflated balloon. This completely seen on the subsequent arteriograms. The balloon guide catheter was then retrieved into the right common carotid artery. A control arteriogram performed through this demonstrated the right internal carotid artery intracranially and extra cranially to be widely patent. The right middle cerebral artery and the right anterior cerebral artery remained widely patent without evidence  of occlusions, or of intraluminal filling defects. A diagnostic JB 1 catheter was then advanced over an 035 inch guidewire to the aortic arch region, and positioned in the mid left common carotid artery. The guidewire was removed. Good aspiration obtained from the hub of the diagnostic catheter. An arteriogram performed through this demonstrated the left external carotid artery to be mildly narrowed at its origin. Its branches remained patent. The left internal carotid artery at the bulb to the cranial skull base demonstrates  wide patency. The petrous, the cavernous and the supraclinoid ICA demonstrate wide patency. The left middle cerebral artery and the left anterior cerebral artery opacify into the capillary and venous phases. Prompt cross-filling via the anterior communicating artery of the right anterior cerebral A2 segment and distally was noted. The 7 French Angio-Seal closure device was then deployed at the right groin puncture site associated with manual compression with quick clot for 25 minutes for hemostasis. Distal pulses remained unchanged from prior to the procedure. CT of the brain demonstrated no evidence of intracranial hemorrhage. Patient was extubated. The patient followed simple commands appropriately. Patient was able to bend her left leg against gravity. No significant movement was evident in the left upper extremity. She was then transferred to PACU and subsequently to post revascularization care. IMPRESSION: Status post complete revascularization of the right middle cerebral artery distal M1 segment with 1 pass with a 4 mm x 40 mm Solitaire X retrieval device, and contact aspiration achieving a TICI 3 revascularization. PLAN: As per referring MD. Electronically Signed   By: Luanne Bras [name]   On: 11/07/2022 08:06   MR BRAIN WO CONTRAST  Result Date: 11/03/2022 CLINICAL DATA:  Stroke follow-up.  Right M1 occlusion. EXAM: MRI HEAD WITHOUT CONTRAST TECHNIQUE: Multiplanar, multiecho pulse sequences of the brain and surrounding structures were obtained without intravenous contrast. COMPARISON:  Same-day CT head FINDINGS: Incomplete study due to patient tolerance. Motion degraded axial FLAIR, axial SWI, and axial and coronal DWI sequences were obtained. 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 There is no associated hemorrhage or mass  effect. There is no acute intracranial hemorrhage, extra-axial fluid collection, or acute infarct. There is unchanged background parenchymal volume loss with prominence of the ventricular system and extra-axial CSF spaces. The ventricles are similar in size to 2017. There is extensive confluent FLAIR signal abnormality in the supratentorial white matter consistent with advanced underlying chronic small-vessel ischemic change. 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 There is no mass lesion.  There is no mass effect or midline shift. Vascular: Not well assessed on the current study. Skull and upper cervical spine: Grossly unremarkable on the provided sequences. Sinuses/Orbits: The paranasal sinuses are clear. The globes and orbits are grossly unremarkable. Other: None. IMPRESSION: 1. Incomplete, motion degraded study. 2. Small areas of acute infarct in the right frontal and temporal lobe cortex and caudate body without hemorrhage or mass effect. 3. 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 Electronically Signed   By: Valetta Mole [name]   On: 11/03/2022 19:16   CT HEAD CODE STROKE WO CONTRAST  Result Date: 11/03/2022 CLINICAL DATA:  Left-sided deficit and facial droop. EXAM: CT ANGIOGRAPHY HEAD AND NECK TECHNIQUE: Multidetector  CT imaging of the head and neck was performed using the standard protocol during bolus administration of intravenous contrast. Multiplanar CT image reconstructions and MIPs were obtained to evaluate the vascular anatomy. Carotid stenosis measurements (when applicable) are obtained utilizing NASCET criteria, using the distal internal carotid diameter as the denominator. RADIATION DOSE REDUCTION: This exam was performed according to the departmental dose-optimization program which includes automated exposure control,  adjustment of the mA and/or kV according to patient size and/or use of iterative reconstruction technique. CONTRAST:  80m OMNIPAQUE IOHEXOL 350 MG/ML SOLN COMPARISON:  Brain MRI 12/30/2015 FINDINGS: CT HEAD FINDINGS Brain: There is no evidence of acute intracranial hemorrhage, extra-axial fluid collection, or acute territorial infarct. ASPECTS is 10. There is moderate background parenchymal volume loss with prominence of the ventricular system and extra-axial CSF spaces. The ventricles are similar in size to 2017. Extensive confluent hypodensity throughout the supratentorial white matter likely reflects sequela of underlying chronic small-vessel ischemic change There is no mass lesion.  There is no mass effect or midline shift. Vascular: See below. Skull: Normal. Negative for fracture or focal lesion. Sinuses/Orbits: The paranasal sinuses are clear. Bilateral lens implants are in place. The globes and orbits are otherwise unremarkable. Other: None. Review of the MIP images confirms the above findings CTA NECK FINDINGS Aortic arch: The imaged aortic arch is normal. The origins of the left common carotid and subclavian arteries are patent. The brachiocephalic artery origin is not imaged. The subclavian arteries are patent to the level Right carotid system: Imaged. The right common, internal, and external carotid arteries are patent, without hemodynamically significant stenosis or occlusion. There is no dissection or aneurysm. Left carotid system: The left common, internal, and external carotid arteries are patent, without hemodynamically significant stenosis or occlusion. There is no dissection or aneurysm. Vertebral arteries: The vertebral arteries are patent, without hemodynamically significant stenosis or occlusion. There is no dissection or aneurysm. Skeleton: There is no acute osseous abnormality or suspicious osseous lesion. There is no visible canal hematoma. Other neck: The soft tissues of the neck are  unremarkable. Upper chest: The imaged lung apices are clear. Review of the MIP images confirms the above findings CTA HEAD FINDINGS Anterior circulation: The intracranial ICAs are patent. There is a right M1 occlusion. An inferior right M2 branch reconstitutes in the sylvian fissure common but there is overall poor collateral flow in the MCA distribution. The left MCA branches are patent, without proximal stenosis or occlusion. The bilateral ACAs are patent, without proximal stenosis or occlusion. The anterior communicating artery is normal. There is no aneurysm or AVM. Posterior circulation: The bilateral V4 segments are patent. The basilar artery is patent. The major cerebellar arteries appear patent. The bilateral PCAs are patent, without proximal stenosis or occlusion. Posterior communicating arteries are not definitely seen. There is no aneurysm or AVM. Venous sinuses: As permitted by contrast timing, patent. Anatomic variants: None. Review of the MIP images confirms the above findings IMPRESSION: 1. No acute intracranial pathology on initial noncontrast head CT. ASPECTS is 10. 2. 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 3. Otherwise patent vasculature of the head and neck without other hemodynamically significant stenosis or occlusion. Results of the initial noncontrast head CT were paged via Amion to Dr. XErlinda Hongat 2:35 pm. CTA results were discussed at 2:38 pm. Electronically Signed   By: PValetta MoleM.D.   On: 11/03/2022 14:44   CT ANGIO HEAD NECK W WO CM (CODE STROKE)  Result Date: 11/03/2022  CLINICAL DATA:  Left-sided deficit and facial droop. EXAM: CT ANGIOGRAPHY HEAD AND NECK TECHNIQUE: Multidetector CT imaging of the head and neck was performed using the standard protocol during bolus administration of intravenous contrast. Multiplanar CT image reconstructions and MIPs were obtained to evaluate the vascular anatomy. Carotid stenosis measurements (when applicable) are obtained  utilizing NASCET criteria, using the distal internal carotid diameter as the denominator. RADIATION DOSE REDUCTION: This exam was performed according to the departmental dose-optimization program which includes automated exposure control, adjustment of the mA and/or kV according to patient size and/or use of iterative reconstruction technique. CONTRAST:  75m OMNIPAQUE IOHEXOL 350 MG/ML SOLN COMPARISON:  Brain MRI 12/30/2015 FINDINGS: CT HEAD FINDINGS Brain: There is no evidence of acute intracranial hemorrhage, extra-axial fluid collection, or acute territorial infarct. ASPECTS is 10. There is moderate background parenchymal volume loss with prominence of the ventricular system and extra-axial CSF spaces. The ventricles are similar in size to 2017. Extensive confluent hypodensity throughout the supratentorial white matter likely reflects sequela of underlying chronic small-vessel ischemic change There is no mass lesion.  There is no mass effect or midline shift. Vascular: See below. Skull: Normal. Negative for fracture or focal lesion. Sinuses/Orbits: The paranasal sinuses are clear. Bilateral lens implants are in place. The globes and orbits are otherwise unremarkable. Other: None. Review of the MIP images confirms the above findings CTA NECK FINDINGS Aortic arch: The imaged aortic arch is normal. The origins of the left common carotid and subclavian arteries are patent. The brachiocephalic artery origin is not imaged. The subclavian arteries are patent to the level Right carotid system: Imaged. The right common, internal, and external carotid arteries are patent, without hemodynamically significant stenosis or occlusion. There is no dissection or aneurysm. Left carotid system: The left common, internal, and external carotid arteries are patent, without hemodynamically significant stenosis or occlusion. There is no dissection or aneurysm. Vertebral arteries: The vertebral arteries are patent, without  hemodynamically significant stenosis or occlusion. There is no dissection or aneurysm. Skeleton: There is no acute osseous abnormality or suspicious osseous lesion. There is no visible canal hematoma. Other neck: The soft tissues of the neck are unremarkable. Upper chest: The imaged lung apices are clear. Review of the MIP images confirms the above findings CTA HEAD FINDINGS Anterior circulation: The intracranial ICAs are patent. There is a right M1 occlusion. An inferior right M2 branch reconstitutes in the sylvian fissure common but there is overall poor collateral flow in the MCA distribution. The left MCA branches are patent, without proximal stenosis or occlusion. The bilateral ACAs are patent, without proximal stenosis or occlusion. The anterior communicating artery is normal. There is no aneurysm or AVM. Posterior circulation: The bilateral V4 segments are patent. The basilar artery is patent. The major cerebellar arteries appear patent. The bilateral PCAs are patent, without proximal stenosis or occlusion. Posterior communicating arteries are not definitely seen. There is no aneurysm or AVM. Venous sinuses: As permitted by contrast timing, patent. Anatomic variants: None. Review of the MIP images confirms the above findings IMPRESSION: 1. No acute intracranial pathology on initial noncontrast head CT. ASPECTS is 10. 2. 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 3. Otherwise patent vasculature of the head and neck without other hemodynamically significant stenosis or occlusion. Results of the initial noncontrast head CT were paged via Amion to Dr. XErlinda Hongat 2:35 pm. CTA results were discussed at 2:38 pm. Electronically Signed   By: PValetta MoleM.D.   On: 11/03/2022  14:44   DG CHEST PORT 1 VIEW  Result Date: 10/31/2022 CLINICAL DATA:  Shortness of breath EXAM: PORTABLE CHEST 1 VIEW COMPARISON:  Chest x-ray dated October 31, 2022 FINDINGS: Cardiac and mediastinal contours are  unchanged. Increased bilateral interstitial opacities. Similar right basilar opacity. Probable new small bilateral pleural effusions. No evidence of pneumothorax IMPRESSION: 1. Slightly increased bilateral interstitial opacities, likely due to worsened pulmonary edema. 2. Similar right basilar opacity which may be due to infection or aspiration. 3. Probable new small bilateral pleural effusions. Electronically Signed   By: Yetta Glassman [name]   On: 10/31/2022 15:57   DG CHEST PORT 1 VIEW  Result Date: 10/31/2022 CLINICAL DATA:  Post thoracentesis yesterday, shortness of breath, atrial fibrillation EXAM: PORTABLE CHEST 1 VIEW COMPARISON:  Portable exam 0606 hours compared to 10/30/2022 FINDINGS: Upper normal heart size. Mediastinal contours and pulmonary vascularity normal. Bibasilar atelectasis. Patchy opacity RIGHT base question infiltrate, little changed. Upper lungs clear. No pleural effusion or pneumothorax. 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 IMPRESSION: Bibasilar atelectasis and probable RIGHT basilar infiltrate. Electronically Signed   By: Lavonia Dana [name]   On: 10/31/2022 08:30   DG CHEST PORT 1 VIEW  Result Date: 10/30/2022 CLINICAL DATA:  Congestive heart failure status post thoracentesis EXAM: PORTABLE CHEST 1 VIEW COMPARISON:  Chest radiograph dated 10/29/2022, CTA chest dated 10/29/2022 FINDINGS: Normal lung volumes. Persistent confluence right lower lung opacity. Patchy left basilar opacity. No definite pleural effusion questionable trace right apical pneumothorax. The heart size and mediastinal contours are within normal limits. The visualized skeletal structures are unremarkable. IMPRESSION: 1. Persistent confluence right lower lung opacity, suspicious for pneumonia. 2. Patchy left basilar opacity, likely atelectasis. 3. Questionable trace right apical pneumothorax. No definite pleural effusion. Recommend attention on follow-up. Electronically Signed   By:  Darrin Nipper [name]   On: 10/30/2022 15:58   ECHOCARDIOGRAM COMPLETE  Result Date: 10/30/2022    ECHOCARDIOGRAM REPORT   Patient Name:   [name] Date of Exam: 10/30/2022 Medical Rec #:  [id number]           Height:       67.5 in Accession #:    [id number]          Weight:       171.5 lb Date of Birth:  1941/02/03           BSA:          1.905 m Patient Age:    11 years            BP:           119/92 mmHg Patient Gender: F                   HR:           103 bpm. Exam Location:  Inpatient Procedure: 2D Echo, Color Doppler and Cardiac Doppler Indications:    I48.91* Unspecified atrial fibrillation  History:        Patient has prior history of Echocardiogram examinations, most                 recent 01/10/2016. Arrythmias:Atrial Fibrillation.  Sonographer:    Raquel Sarna Senior RDCS Referring Phys: [phone] A CALDWE[place]  1. Left ventricular ejection fraction, by estimation, is 40 to 45%. The left ventricle has mildly decreased function. The left ventricle demonstrates global hypokinesis. There is moderate asymmetric left ventricular hypertrophy of the basal-septal segment. Left ventricular diastolic parameters  are indeterminate.  2. Right ventricular systolic function is normal. The right ventricular size is normal. There is normal pulmonary artery systolic pressure.  3. Left atrial size was mildly dilated.  4. The mitral valve is abnormal. 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 No evidence of mitral stenosis.  5. The aortic valve was not well visualized. Aortic valve regurgitation is trivial. No aortic stenosis is present.  6. The inferior vena cava is dilated in size with >50% respiratory variability, suggesting right atrial pressure of 8 mmHg. Comparison(s): MR is worse from prior report. FINDINGS  Left Ventricle: Left ventricular ejection fraction, by estimation, is 40 to 45%. The left ventricle has mildly decreased function. The left ventricle demonstrates global hypokinesis. The  left ventricular internal cavity size was normal in size. There is  moderate asymmetric left ventricular hypertrophy of the basal-septal segment. Left ventricular diastolic parameters are indeterminate. Right Ventricle: The right ventricular size is normal. No increase in right ventricular wall thickness. Right ventricular systolic function is normal. There is normal pulmonary artery systolic pressure. The tricuspid regurgitant velocity is 2.72 m/s, and  with an assumed right atrial pressure of 3 mmHg, the estimated right ventricular systolic pressure is 36.6 mmHg. Left Atrium: Left atrial size was mildly dilated. Right Atrium: Right atrial size was normal in size. Pericardium: The pericardium was not well visualized. Mitral Valve: MR volume 52. The mitral valve is abnormal. Mild mitral annular calcification. 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 No evidence of mitral valve stenosis. Tricuspid Valve: The tricuspid valve is not well visualized. Tricuspid valve regurgitation is not demonstrated. No evidence of tricuspid stenosis. Aortic Valve: The aortic valve was not well visualized. Aortic valve regurgitation is trivial. No aortic stenosis is present. Pulmonic Valve: The pulmonic valve was not well visualized. Pulmonic valve regurgitation is not visualized. No evidence of pulmonic stenosis. Aorta: The aortic root, ascending aorta and aortic arch are all structurally normal, with no evidence of dilitation or obstruction. Venous: The inferior vena cava is dilated in size with greater than 50% respiratory variability, suggesting right atrial pressure of 8 mmHg. IAS/Shunts: No atrial level shunt detected by color flow Doppler.  LEFT VENTRICLE PLAX 2D LVIDd:         4.20 cm LVIDs:         3.50 cm LV PW:         0.90 cm LV IVS:        1.25 cm LVOT diam:     2.10 cm LV SV:         40 LV SV Index:   21 LVOT Area:     3.46 cm  RIGHT VENTRICLE RV S prime:     10.30 cm/s TAPSE (M-mode): 1.3 cm LEFT ATRIUM              Index        RIGHT ATRIUM           Index LA diam:        3.80 cm 1.99 cm/m   RA Area:     13.50 cm LA Vol (A2C):   65.3 ml 34.28 ml/m  RA Volume:   31.30 ml  16.43 ml/m LA Vol (A4C):   76.4 ml 40.11 ml/m LA Biplane Vol: 75.7 ml 39.74 ml/m  AORTIC VALVE LVOT Vmax:   74.00 cm/s LVOT Vmean:  48.700 cm/s LVOT VTI:    0.116 m  AORTA Ao Root diam: 3.30 cm Ao Asc diam:  3.10 cm MR Peak grad:  84.6 mmHg    TRICUSPID VALVE MR Mean grad:    60.0 mmHg    TR Peak grad:   29.6 mmHg MR Vmax:         460.00 cm/s  TR Vmax:        272.00 cm/s MR Vmean:        370.5 cm/s MR PISA:         4.02 cm     SHUNTS MR PISA Eff ROA: 28 mm       Systemic VTI:  0.12 m MR PISA Radius:  0.80 cm      Systemic Diam: 2.10 cm Rudean Haskell MD Electronically signed by Rudean Haskell MD Signature Date/Time: 10/30/2022/10:54:26 AM    Final    CT Angio Chest PE W and/or Wo Contrast  Result Date: 10/29/2022 CLINICAL DATA:  Pulmonary embolism (PE) suspected, low to intermediate prob, positive D-dimer EXAM: CT ANGIOGRAPHY CHEST WITH CONTRAST TECHNIQUE: Multidetector CT imaging of the chest was performed using the standard protocol during bolus administration of intravenous contrast. Multiplanar CT image reconstructions and MIPs were obtained to evaluate the vascular anatomy. RADIATION DOSE REDUCTION: This exam was performed according to the departmental dose-optimization program which includes automated exposure control, adjustment of the mA and/or kV according to patient size and/or use of iterative reconstruction technique. CONTRAST:  29m OMNIPAQUE IOHEXOL 350 MG/ML SOLN COMPARISON:  None Available. FINDINGS: Cardiovascular: Heart is normal size. Aorta is normal caliber. Scattered coronary artery and aortic calcifications. No filling defects in the pulmonary arteries to suggest pulmonary emboli. Mediastinum/Nodes: Mildly prominent mediastinal and bilateral hilar lymph nodes. No axillary adenopathy. Trachea and esophagus are  unremarkable. Lungs/Pleura: Moderate right pleural effusion. Airspace disease in the right lower lobe compatible with pneumonia. Clustered tree-in-bud nodular densities in the right upper lobe also likely infectious. No confluent opacity on the left. Upper Abdomen: No acute findings Musculoskeletal: Chest wall soft tissues are unremarkable. No acute bony abnormality. Review of the MIP images confirms the above findings. IMPRESSION: Right lower lobe consolidation with moderate right pleural effusion. Tree-in-bud nodular densities in the right upper lobe. Findings compatible with pneumonia. No evidence of pulmonary embolus. Coronary artery disease. Aortic Atherosclerosis (ICD10-I70.0). Electronically Signed   By: KRolm BaptiseM.D.   On: 10/29/2022 23:43   DG Chest Portable 1 View  Result Date: 10/29/2022 CLINICAL DATA:  Atrial fibrillation, palpitations EXAM: PORTABLE CHEST 1 VIEW COMPARISON:  10/23/2022 FINDINGS: Transverse diameter of heart is slightly increased. There are no signs of alveolar pulmonary edema. There is interval decrease in patchy infiltrates in right parahilar region and right lower lung field. New small patchy infiltrate is seen in the lateral aspect of left lower lung field. There is no pleural effusion or pneumothorax. IMPRESSION: There is interval decrease in patchy infiltrates in right mid and right lower lung fields suggesting resolving atelectasis/pneumonia. There is new small patchy infiltrate in left lower lung fields suggesting multifocal pneumonia. Electronically Signed   By: PElmer PickerM.D.   On: 10/29/2022 16:37   DG Chest 2 View  Result Date: 10/25/2022 CLINICAL DATA:  Cough and shortness of breath. Recent diagnosis of pneumonia. EXAM: CHEST - 2 VIEW COMPARISON:  PA and lateral chest 10/05/2022. FINDINGS: Patchy airspace disease in the right chest, worst lung base has progressed since the prior study. The left lung remains clear. Heart size is normal. No pneumothorax  or pleural fluid. No acute bony abnormality. IMPRESSION: Worsened airspace disease in the right chest most consistent with progressive pneumonia. Electronically Signed  By: Inge Rise [name]   On: 10/25/2022 07:50    Microbiology: Results for orders placed or performed during the hospital encounter of 10/29/22  Resp Panel by RT-PCR (Flu A&B, Covid) Anterior Nasal Swab     Status: None   Collection Time: 10/29/22  4:36 PM   Specimen: Anterior Nasal Swab  Result Value Ref Range Status   SARS Coronavirus 2 by RT PCR NEGATIVE NEGATIVE Final    Comment: (NOTE) SARS-CoV-2 target nucleic acids are NOT DETECTED.  The SARS-CoV-2 RNA is generally detectable in upper respiratory specimens during the acute phase of infection. The lowest concentration of SARS-CoV-2 viral copies this assay can detect is 138 copies/mL. A negative result does not preclude SARS-Cov-2 infection and should not be used as the sole basis for treatment or other patient management decisions. A negative result may occur with  improper specimen collection/handling, submission of specimen other than nasopharyngeal swab, presence of viral mutation(s) within the areas targeted by this assay, and inadequate number of viral copies(<138 copies/mL). A negative result must be combined with clinical observations, patient history, and epidemiological information. The expected result is Negative.  Fact Sheet for Patients:  EntrepreneurPulse.com.au  Fact Sheet for Healthcare Providers:  IncredibleEmployment.be  This test is no t yet approved or cleared by the [place] FDA and  has been authorized for detection and/or diagnosis of SARS-CoV-2 by FDA under an Emergency Use Authorization (EUA). This EUA will remain  in effect (meaning this test can be used) for the duration of the COVID-19 declaration under Section 564(b)(1) of the Act, 21 U.S.C.section 360bbb-3(b)(1), unless the authorization  is terminated  or revoked sooner.       Influenza A by PCR NEGATIVE NEGATIVE Final   Influenza B by PCR NEGATIVE NEGATIVE Final    Comment: (NOTE) The Xpert Xpress SARS-CoV-2/FLU/RSV plus assay is intended as an aid in the diagnosis of influenza from Nasopharyngeal swab specimens and should not be used as a sole basis for treatment. Nasal washings and aspirates are unacceptable for Xpert Xpress SARS-CoV-2/FLU/RSV testing.  Fact Sheet for Patients: EntrepreneurPulse.com.au  Fact Sheet for Healthcare Providers: IncredibleEmployment.be  This test is not yet approved or cleared by the [place] FDA and has been authorized for detection and/or diagnosis of SARS-CoV-2 by FDA under an Emergency Use Authorization (EUA). This EUA will remain in effect (meaning this test can be used) for the duration of the COVID-19 declaration under Section 564(b)(1) of the Act, 21 U.S.C. section 360bbb-3(b)(1), unless the authorization is terminated or revoked.  Performed at [facility], [address] [place], [place] [address]   Blood culture (routine x 2)     Status: None   Collection Time: 10/29/22  5:30 PM   Specimen: BLOOD  Result Value Ref Range Status   Specimen Description   Final    BLOOD RIGHT ANTECUBITAL Performed at [facility], [address], [place], [place] [address]    Special Requests   Final    Blood Culture adequate volume BOTTLES DRAWN AEROBIC AND ANAEROBIC Performed at [facility], [address] [place], [place] [address]    Culture   Final    NO GROWTH 5 DAYS Performed at [facility], [place] [address] [place], [place] [address]    Report Status 11/03/2022 FINAL  Final  Blood culture (routine x 2)     Status: None   Collection Time: 10/29/22  6:10 PM   Specimen: BLOOD  Result Value Ref Range Status  Specimen Description   Final    BLOOD BLOOD RIGHT  FOREARM Performed at [facility], [address] [place], [place] [address]    Special Requests   Final    Blood Culture adequate volume BOTTLES DRAWN AEROBIC ONLY Performed at [facility], [address], [place], [place] [address]    Culture   Final    NO GROWTH 5 DAYS Performed at [facility], [place] [address], [place], [place] [address]    Report Status 11/03/2022 FINAL  Final  MRSA Next Gen by PCR, Nasal     Status: None   Collection Time: 10/30/22  7:40 AM   Specimen: Nasal Mucosa; Nasal Swab  Result Value Ref Range Status   MRSA by PCR Next Gen NOT DETECTED NOT DETECTED Final    Comment: (NOTE) The GeneXpert MRSA Assay (FDA approved for NASAL specimens only), is one component of a comprehensive MRSA colonization surveillance program. It is not intended to diagnose MRSA infection nor to guide or monitor treatment for MRSA infections. Test performance is not FDA approved in patients less than 87 years old. Performed at [facility], [place] [address] [place], [place] [address]   Body fluid culture w Gram Stain     Status: None   Collection Time: 10/30/22  3:51 PM   Specimen: Pleural Fluid  Result Value Ref Range Status   Specimen Description PLEURAL  Final   Special Requests RIGHT LUNG  Final   Gram Stain NO WBC SEEN NO ORGANISMS SEEN CYTOSPIN SMEAR   Final   Culture   Final    NO GROWTH 3 DAYS Performed at [facility], [place] [address], [place], [place] [address]    Report Status 11/02/2022 FINAL  Final    Labs: CBC: Recent Labs  Lab 11/04/22 0422 11/05/22 0531 11/06/22 0343 11/07/22 0133 11/08/22 0146  WBC 11.0* 14.5* 12.9* 11.8* 14.2*  NEUTROABS 9.5*  --   --   --   --   HGB 11.3* 12.0 11.8* 11.7* 13.1  HCT 36.4 37.3 36.5 37.2 40.8  MCV 88.8 88.0 88.4 88.2 87.9  PLT 338 302 272 287 929   Basic Metabolic Panel: Recent Labs  Lab 11/02/22 0030 11/03/22 0047 11/04/22 0422 11/05/22 0531  11/06/22 0343 11/07/22 0133 11/08/22 0146  NA 142 140 136 139 138 135 139  K 3.2* 4.3 4.2 4.1 3.9 3.8 3.7  CL 101 101 103 101 102 101 103  CO2 '29 29 24 28 26 25 26  '$ GLUCOSE 109* 140* 173* 121* 110* 118* 105*  BUN 27* '23 16 15 18 17 17  '$ CREATININE 1.25* 1.25* 0.86 1.19* 1.10* 1.12* 1.13*  CALCIUM 9.0 9.3 8.5* 8.9 8.8* 8.8* 9.2  MG 2.2 2.5*  --   --   --   --   --   PHOS 3.9 4.0  --   --   --   --   --    Liver Function Tests: No results for input(s): "AST", "ALT", "ALKPHOS", "BILITOT", "PROT", "ALBUMIN" in the last 168 hours. CBG: Recent Labs  Lab 11/03/22 1357  GLUCAP 149*    Discharge time spent: greater than 30 minutes.  Signed: Tawni Millers, MD [facility] 11/08/2022

## 2022-11-08 NOTE — Consult Note (Signed)
[facility] Nurse Consult Note: Patient receiving care in [place]. Reason for Consult: blisters on buttocks Wound type: unknown. Patient states she has had many back surgeries over decades. These areas came up in relation to the back surgeries. She keeps them washed and clean. Pressure Injury POA: Yes/No/NA Measurement: Wound bed: on the right buttock there is a raised area that has a white appearance. On the left buttocks there are several pink circles. The patient tells me these do not hurt, they do not itch. Drainage (amount, consistency, odor) none Periwound: reddened, but blanchable. Dressing procedure/placement/frequency: Provide routine soap and water hygiene to the buttocks.  Monitor the wound area(s) for worsening of condition such as: Signs/symptoms of infection,  Increase in size,  Development of or worsening of odor, Development of pain, or increased pain at the affected locations.  Notify the medical team if any of these develop.  Thank you for the consult.  Discussed plan of care with the patient.  [facility] nurse will not follow at this time.  Please re-consult the [facility] team if needed.  Val Riles, RN, MSN, CWOCN, CNS-BC, pager [phone]

## 2022-11-08 NOTE — H&P (Shared)
Physical Medicine and Rehabilitation Admission H&P    Chief Complaint  Patient presents with   Functional deficits due to stroke    HPI: Leslie [name] is an 81 year old RH-female with history of prior CVA, CKD, GERD recent bouts with cough treated with atbx/steroids who was admitted with reports of 2-3 weeks of palpation, seen at PCP office for lightheadedness w/dizziness found to have A fib with RVR and sent to [facility] on 10/29/22 for evaluation.  She was started on IV Cardizem and Heparin. CT chest done due to episode of CP and reports of cough and revealed right layering pleural effusion and RUL PNA. She was started on Ceftriaxone and Zithromax for CAP and underwent thoracocentesis of 500 cc parapneumonic effusion by PCCM. Dr. Martinique consulted for management of A Fib and fel that new onset likely due to PNA.  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  She was started on metoprolol but reported SOB side effects therefore this was d/c, Cardizem titrated upwards, transitioned to Eliquis and plans for outpatient cardioversion in 3 week.   On 11/25, patient had sudden onset of right gaze deviation with left hemiparesis and left neglect. 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  Dr. Erlinda Hong felt that stroke was embolic due to A fib w/RVR even on Eliquis. Follow up MRI brain done revealing small acute infarct in right frontal, temporal lobe and caudate body with advanced chronic small vessel disease. Dr. Curt Bears following for ongoing issues with A flutter which converted with IV amiodarone 11/25 and Cardizem titrated up to 360 mg but heart rate continues to run up to 120's at rest. MBS done revealing moderate spillage with buccal residue and delay in swallow with occasional trace aspiration.  She was started on D3, nectar liquids but reports has been drinking "good" (unthickened) water to clear residue. Left sided weakness improving with increase in left attention but she continues to be limited by cognitive impairments, left sensory deficits, balance deficits and dysarthria. CIR recommended due to functional decline.    Review of Systems  Constitutional:  Negative for chills and fever.  HENT:  Negative for hearing loss and tinnitus.   Respiratory:  Negative for shortness of breath.   Cardiovascular:  Negative for chest pain and palpitations.  Gastrointestinal:  Positive for constipation. Negative for heartburn and nausea.  Genitourinary:  Negative for dysuria.  Musculoskeletal:  Positive for back pain and joint pain (right hip pain).  Neurological:  Positive for dizziness (looking up/vertigo and nausea) and sensory change.    Past Medical History:  Diagnosis Date   Arthritis    Back pain due to injury    fell off a ladder and fractured back-T12 down (multiple surgeries w/ onging pain)   Chronic kidney disease    "stage 3 kidney failure" improving per pt   Headache    Hypothyroidism    Insomnia    Plantar fasciitis    Stomach ulcer ~2000   Stroke [facility])    TIA X2   2001, jan 2017   Vitamin D deficiency     Past Surgical History:  Procedure Laterality Date   ABDOMINAL HYSTERECTOMY  1974   ANKLE FUSION Right 06/07/2016   Procedure: RIGHT TALONAVICULAR ARTHRODESIS ;  Surgeon: Wylene Simmer, MD;  Location: [facility];  Service:  Orthopedics;  Laterality: Right;   APPENDECTOMY     BACK SURGERY     x4  fusion T12 thru sacrum; U rod in sacrum   CATARACT EXTRACTION, BILATERAL Bilateral 2015   GASTROCNEMIUS RECESSION Right 06/07/2016   Procedure: RIGHT GASTROC RECESSION ;  Surgeon: Wylene Simmer, MD;  Location: [facility];  Service: Orthopedics;  Laterality: Right;   HAND SURGERY Left 2003   x2   IR ANGIO EXTRACRAN SEL COM CAROTID INNOMINATE UNI L  MOD SED  11/03/2022   IR CT HEAD LTD  11/03/2022   IR PERCUTANEOUS ART THROMBECTOMY/INFUSION INTRACRANIAL INC DIAG ANGIO  11/03/2022   L heel surgery for torn tendon Left 2015   PLANTAR FASCIA SURGERY Right 2009   surgery at [facility] 11/03/2022   Procedure: IR WITH ANESTHESIA;  Surgeon: Radiologist, Medication, MD;  Location: [facility];  Service: Radiology;  Laterality: N/A;   SHOULDER ARTHROSCOPY Right 2007   TENDON REPAIR Right 1949   foot- lacerated tendon in arch of foot   THORACENTESIS N/A 10/30/2022   Procedure: THORACENTESIS;  Surgeon: Julian Hy, DO;  Location: [facility];  Service: Cardiopulmonary;  Laterality: N/A;   TOTAL HIP ARTHROPLASTY Left 10/05/2021   Procedure: TOTAL HIP ARTHROPLASTY ANTERIOR APPROACH;  Surgeon: Rod Can, MD;  Location: [facility];  Service: Orthopedics;  Laterality: Left;    Family History  Problem Relation Age of Onset   Hypertension Mother    Lung cancer Mother    Lung cancer Father    Diabetes Sister    Hypertension Sister    Congestive Heart Failure Sister    Arthritis Brother     Social History: Lives alone, Was an Medical illustrator. She  reports that she quit smoking about 36 years ago. Her smoking use included cigarettes--she smoked about a year. She has never used smokeless tobacco. She reports that she does not drink alcohol and does not use drugs.   Allergies  Allergen Reactions   Duloxetine Nausea Only   Voltaren [Diclofenac Sodium] Shortness Of Breath and Palpitations   Lisinopril Other (See Comments)   Aspirin Other (See Comments)    ulcers   Diphenhydramine Other (See Comments)   Gabapentin Itching   Ultram [Tramadol Hcl] Other (See Comments)    Effects cognitive abilities    Proton Pump Inhibitors Rash    Itching, rash    Medications Prior to Admission  Medication Sig Dispense Refill   acetaminophen (TYLENOL) 325 MG tablet Take 1-2 tablets (325-650 mg total) by mouth every 6 (six) hours as  needed for mild pain (pain score 1-3 or temp > 100.5). (Patient taking differently: Take 500 mg by mouth every 6 (six) hours as needed for mild pain (pain score 1-3 or temp > 100.5).)     Cholecalciferol (VITAMIN D) 50 MCG (2000 UT) tablet Take 2,000 Units by mouth daily.     COLLAGEN PO Take 12 g by mouth daily.     cyclobenzaprine (FLEXERIL) 10 MG tablet Take 10 mg by mouth 2 (two) times daily as needed for muscle spasms.     famotidine (PEPCID) 20 MG tablet Take 20 mg by mouth daily as needed for indigestion or heartburn.     Humidifier/Vaporizer Supplies (VICKS VAPORIZER SCENT) PADS Inhale 1 Dose into the lungs daily as needed (congestion).     levothyroxine (SYNTHROID) 88 MCG tablet Take 88 mcg by mouth daily before breakfast.     Polyethyl Glycol-Propyl Glycol (SYSTANE OP) Place 1 drop into  both eyes daily as needed (dry eyes).     senna (SENOKOT) 8.6 MG TABS tablet Take 1 tablet (8.6 mg total) by mouth 2 (two) times daily. (Patient taking differently: Take 1 tablet by mouth 2 (two) times daily as needed for mild constipation or moderate constipation.) 120 tablet 0      Home: Home Living Family/patient expects to be discharged to:: Private residence Living Arrangements: Alone Available Help at Discharge: Family, Industrial/product designer, Available 24 hours/day (DTr from [place], [place] and neighbor to arrange 24/7 supervision after CIR) Type of Home: House Home Access: Stairs to enter [employer] of Steps: 2 Entrance Stairs-Rails: Right, Left Home Layout: Two level, Able to live on main level with bedroom/bathroom Alternate Level Stairs-Number of Steps: 4 steps down to main living room w/ rail Bathroom Shower/Tub: Multimedia programmer: Standard Bathroom Accessibility: Yes Home Equipment: Conservation officer, nature (2 wheels), [employer] - single point, Electronics engineer Comments: Daughter to arrange 24/7 supervision; she is here with her spouse from [place] With: Alone   Functional  History: Prior Function Prior Level of Function : Independent/Modified Independent, Driving Mobility Comments: Mod indep with intermittent use of SPC ADLs Comments: stands in walk-in shower  Functional Status:  Mobility: Bed Mobility Overal bed mobility: Needs Assistance Bed Mobility: Supine to Sit Supine to sit: HOB elevated, Min guard General bed mobility comments: min guard for pulling to EoB, heavy use of bedrail to pull to EoB Transfers Overall transfer level: Needs assistance Equipment used: Rolling walker (2 wheels) Transfers: Sit to/from Stand Sit to Stand: Min guard General transfer comment: increased time and effort, pt reports nausea in standing and sits back down Ambulation/Gait Ambulation/Gait assistance: Min assist Gait Distance (Feet): 150 Feet Assistive device: Rolling walker (2 wheels) Gait Pattern/deviations: Step-through pattern, Decreased step length - right, Decreased stride length, Trunk flexed, Drifts right/left General Gait Details: deferred due to drop in BP and increase in HR Gait velocity: decreased Gait velocity interpretation: <1.31 ft/sec, indicative of household ambulator    ADL: ADL Overall ADL's : Needs assistance/impaired Eating/Feeding: Set up, Sitting Eating/Feeding Details (indicate cue type and reason): at the end of the session Grooming: Min guard, Standing Grooming Details (indicate cue type and reason): cues for sequencing Upper Body Bathing: Minimal assistance, Sitting Lower Body Bathing: Minimal assistance, Sit to/from stand Upper Body Dressing : Minimal assistance, Sitting Lower Body Dressing: Minimal assistance, Sit to/from stand Toilet Transfer: Minimal assistance, Ambulation, Rolling walker (2 wheels), BSC/3in1 Toileting- Clothing Manipulation and Hygiene: Minimal assistance, Sitting/lateral lean Functional mobility during ADLs: Minimal assistance, Rolling walker (2 wheels) General ADL Comments: cues and increased time  needed  Cognition: Cognition Overall Cognitive Status: Impaired/Different from baseline Arousal/Alertness: Awake/alert Orientation Level: Oriented X4 Attention: Focused, Sustained, Selective Focused Attention: Appears intact Sustained Attention: Impaired Sustained Attention Impairment: Verbal complex, Functional complex Memory: Impaired Awareness: Impaired Awareness Impairment: Intellectual impairment, Emergent impairment Problem Solving: Impaired Problem Solving Impairment: Functional basic Executive Function: Self Monitoring Self Monitoring: Impaired Self Monitoring Impairment: Verbal basic, Functional basic Cognition Arousal/Alertness: Awake/alert Behavior During Therapy: WFL for tasks assessed/performed Overall Cognitive Status: Impaired/Different from baseline Area of Impairment: Attention, Awareness, Problem solving Current Attention Level: Selective Awareness: Emergent Problem Solving: Slow processing, Requires verbal cues General Comments: Pt in bed talking to phone that shows daughter's name on the screen however daughter not on the line. pt with decreased awareness of not taking all of her pills and dropping them in the bed.   Blood pressure 116/70, pulse 98, temperature (!) 97.5  F (36.4 C), temperature source Oral, resp. rate (!) 22, height 5' 7.5" (1.715 m), weight 72 kg, SpO2 97 %. Physical Exam Constitutional:      Comments: In bed with large amount of food pocked on the left. Needed multiple cues to clear check and swallow.   Cardiovascular:     Rate and Rhythm: Tachycardia present.     Comments: In bed with heart rate ranging 108-120 with conversation.  Neurological:     Mental Status: She is alert and oriented to person, place, and time.     Comments: Left facial weakness with mild to moderate dysarthria--in part due to food in her mouth. Needed multiple cues to clear. She was able to follow basic commands      Results for orders placed or performed  during the hospital encounter of 10/29/22 (from the past 48 hour(s))  CBC     Status: Abnormal   Collection Time: 11/07/22  1:33 AM  Result Value Ref Range   WBC 11.8 (H) 4.0 - 10.5 K/uL   RBC 4.22 3.87 - 5.11 MIL/uL   Hemoglobin 11.7 (L) 12.0 - 15.0 g/dL   HCT 37.2 36.0 - 46.0 %   MCV 88.2 80.0 - 100.0 fL   MCH 27.7 26.0 - 34.0 pg   MCHC 31.5 30.0 - 36.0 g/dL   RDW 15.4 11.5 - 15.5 %   Platelets 287 150 - 400 K/uL   nRBC 0.0 0.0 - 0.2 %    Comment: Performed at [facility], [place] [address], [place], [place] [address]  Basic metabolic panel     Status: Abnormal   Collection Time: 11/07/22  1:33 AM  Result Value Ref Range   Sodium 135 135 - 145 mmol/L   Potassium 3.8 3.5 - 5.1 mmol/L   Chloride 101 98 - 111 mmol/L   CO2 25 22 - 32 mmol/L   Glucose, Bld 118 (H) 70 - 99 mg/dL    Comment: Glucose reference range applies only to samples taken after fasting for at least 8 hours.   BUN 17 8 - 23 mg/dL   Creatinine, Ser 1.12 (H) 0.44 - 1.00 mg/dL   Calcium 8.8 (L) 8.9 - 10.3 mg/dL   GFR, Estimated 50 (L) >60 mL/min    Comment: (NOTE) Calculated using the CKD-EPI Creatinine Equation (2021)    Anion gap 9 5 - 15    Comment: Performed at [facility] [address] [place], [place] [address]  CBC     Status: Abnormal   Collection Time: 11/08/22  1:46 AM  Result Value Ref Range   WBC 14.2 (H) 4.0 - 10.5 K/uL   RBC 4.64 3.87 - 5.11 MIL/uL   Hemoglobin 13.1 12.0 - 15.0 g/dL   HCT 40.8 36.0 - 46.0 %   MCV 87.9 80.0 - 100.0 fL   MCH 28.2 26.0 - 34.0 pg   MCHC 32.1 30.0 - 36.0 g/dL   RDW 15.2 11.5 - 15.5 %   Platelets 312 150 - 400 K/uL   nRBC 0.0 0.0 - 0.2 %    Comment: Performed at [facility], [place] [address], [place], [place] [address]  Basic metabolic panel     Status: Abnormal   Collection Time: 11/08/22  1:46 AM  Result Value Ref Range   Sodium 139 135 - 145 mmol/L   Potassium 3.7 3.5 - 5.1 mmol/L   Chloride 103 98 - 111 mmol/L   CO2 26 22 - 32 mmol/L  Glucose, Bld 105 (H) 70 - 99 mg/dL    Comment: Glucose reference range applies only to samples taken after fasting for at least 8 hours.   BUN 17 8 - 23 mg/dL   Creatinine, Ser 1.13 (H) 0.44 - 1.00 mg/dL   Calcium 9.2 8.9 - 10.3 mg/dL   GFR, Estimated 49 (L) >60 mL/min    Comment: (NOTE) Calculated using the CKD-EPI Creatinine Equation (2021)    Anion gap 10 5 - 15    Comment: Performed at [facility] [address], [place], [place] [address]   No results found.    Blood pressure 116/70, pulse 98, temperature (!) 97.5 F (36.4 C), temperature source Oral, resp. rate (!) 22, height 5' 7.5" (1.715 m), weight 72 kg, SpO2 97 %.  Medical Problem List and Plan: 1. Functional deficits secondary to ***  -patient may *** shower  -ELOS/Goals: *** 2.  Antithrombotics: -DVT/anticoagulation:  Pharmaceutical: Eliquis  -antiplatelet therapy: N/A 3. Pain Management: N/A 4. Mood/Behavior/Sleep: LCSW to follow for evaluation and support.   -antipsychotic agents: N/A 5. Neuropsych/cognition: This patient maybe capable of making decisions on her own behalf. 6. Skin/Wound Care: Routine pressure relief measures.  7. Fluids/Electrolytes/Nutrition: Monitor I/O. Check CMET in am 8. MVR/A fib: Monitor HR TID--on Eliquis and Cardizem but heart rate seems to be uncontrolled.  --monitor for symptoms with increase in activity.   --plans for CV in the future.  --heart healthy diet. 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 9. HTN: Monitor BP TID--on Cardizem and Lasix-->recommendations to d/c Lasix and start aldactone in am.   --lasix to be used prn overload.  10. Leucocytosis:  Monitor for fevers and other signs of infection.   --add supervision at meals for safety/aspiration precautions.   --has fluctuated from 11-->14 this week.  11. Acute renal failure:  SCr 0.98-->1.25-->1.13 12. CAP: Has completed antibiotic regimen 11/05/22.  --monitor for hypoxia/dyspnea-->denies respiratory symptoms.   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 No symptoms?    ***  Bary Leriche, PA-C 11/08/2022

## 2022-11-08 NOTE — Progress Notes (Addendum)
Leslie Staggers, MD  Physician Physical Medicine and Rehabilitation   PMR Pre-admission    Signed   Date of Service: 11/06/2022  2:30 PM  Related encounter: ED to Hosp-Admission (Discharged) from 10/29/2022 in [place] [facility]   Signed      Show:Clear all _0 Written_1 Templated_2 Copied  Added by: _3 Cristina Gong, RN_4 Leslie Staggers, MD  _5 Hover for details PMR Admission Coordinator Pre-Admission Assessment   Patient: Leslie [name] is an 81 y.o., female MRN: [id number] DOB: 1941/08/31 Height: 5' 7.5" (171.5 cm) Weight: 64.1 kg   Insurance Information HMO: yes    PPO:      PCP:      IPA:      80/20:      OTHER:  PRIMARY: [employer]      Policy#: [id number]      Subscriber: pt CM Name: Lavell Luster      Phone#: [phone] ext [phone]     Fax#: [phone] ( note different fax) Pre-Cert#: [id number]   approved until 12/7   Employer:  Benefits:  Phone #: [phone]     Name: 11/28 Eff. Date: 12/10/21     Deduct: none      Out of Pocket Max: $3400       CIR: $295 co pay per day days 1 until 6      SNF: no copay per day days 1 until 20; $196 co pay per day days 21 until 100 Outpatient: $10 to $20 per visit     Co-Pay: visits per medical neccesity Home Health: 100%      Co-Pay: visits per medical neccesity DME: 80%     Co-Pay: 20% Providers: in network  SECONDARY: none     Policy#:      Phone#:    Development worker, community:       Phone#:    The Engineer, petroleum" for patients in Inpatient Rehabilitation Facilities with attached "Privacy Act [place] Records" was provided and verbally reviewed with: Patient and Family   Emergency Contact Information Contact Information       Name Relation Home Work [place] Daughter [phone]   [phone]    Assunta Gambles [phone]   [phone]         Current Medical History  Patient Admitting Diagnosis: CVA   History of Present Illness:  Leslie [name] is an 81 year old RH-female with history of prior CVA, CKD, GERD recent bouts with cough treated with atbx/steroids who was admitted with reports of 2-3 weeks of palpation, seen at PCP office for lightheadedness w/dizziness found to have A fib with RVR and sent to [facility] on 10/29/22 for evaluation.  She was started on IV Cardizem and Heparin. CT chest done due to episode of CP and reports of cough and revealed right layering pleural effusion and RUL PNA. She was started on Ceftriaxone and Zithromax for CAP and underwent thoracocentesis of 500 cc parapneumonic effusion by PCCM. Dr. Martinique consulted for management of A Fib and fel that new onset likely due to PNA.  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  She was started on metoprolol but reported SOB side effects therefore this was d/c, Cardizem titrated upwards, transitioned to Eliquis and plans for outpatient cardioversion in 3 week.    On 11/25, patient had sudden onset of right gaze deviation with left hemiparesis and left neglect. 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  Dr. Erlinda Hong felt that stroke was embolic due to A fib w/RVR even on Eliquis. Follow up MRI brain done revealing small acute infarct in right frontal, temporal lobe and caudate body with advanced chronic small vessel disease. Dr. Curt Bears following for ongoing issues with A flutter which converted with IV amiodarone 11/25 and Cardizem titrated up to 360 mg but heart rate continues to run up to 120's at rest. MBS done revealing moderate spillage with buccal residue and delay in swallow with occasional trace aspiration. She was started on D3, nectar liquids but reports has been drinking "good" (unthickened) water to clear residue. Left sided weakness improving with  increase in left attention but she continues to be limited by cognitive impairments, left sensory deficits, balance deficits and dysarthria. CIR recommended due to functional decline.      Complete NIHSS TOTAL: 3   Patient's medical record from [facility] has been reviewed by the rehabilitation admission coordinator and physician.   Past Medical History      Past Medical History:  Diagnosis Date   Arthritis     Chronic kidney disease      "stage 3 kidney failure" improving per pt   Headache     Hypothyroidism     Insomnia     Plantar fasciitis     Stomach ulcer ~2000   Stroke [facility])      TIA X2   2001, jan 2017   Vitamin D deficiency      Has the patient had major surgery during 100 days prior to admission? yes   Family History   family history includes Arthritis in her brother; Congestive Heart Failure in her sister; Diabetes in her sister; Hypertension in her mother and sister; Lung cancer in her father and mother.   Current Medications   Current Facility-Administered Medications:    0.9 %  sodium chloride infusion, , Intravenous, Continuous, Rosalin Hawking, MD, Last Rate: 50 mL/hr at 11/06/22 0800, Infusion Verify at 11/06/22 0800   apixaban (ELIQUIS) tablet 5 mg, 5 mg, Oral, BID, Wise, Nason S, RPH, 5 mg at 11/06/22 0930   calcium carbonate (TUMS - dosed in mg elemental calcium) chewable tablet 200 mg of elemental calcium, 1 tablet, Oral, TID PRN, Rosalin Hawking, MD, 200 mg of elemental calcium at 11/04/22 1942   Chlorhexidine Gluconate Cloth 2 % PADS 6 each, 6 each, Topical, Daily, Rosalin Hawking, MD, 6 each at 11/06/22 0930   cyanocobalamin (VITAMIN B12) tablet 1,000 mcg, 1,000 mcg, Oral, Daily, Elodia Florence., MD, 1,000 mcg at 11/06/22 0930   diltiazem (CARDIZEM CD) 24 hr capsule 360 mg, 360 mg, Oral, Daily, Elodia Florence., MD, 360 mg at 11/06/22 1036   famotidine (PEPCID) tablet 20 mg, 20 mg, Oral, Daily, Rosalin Hawking, MD, 20 mg at 11/06/22 0930    furosemide (LASIX) tablet 40 mg, 40 mg, Oral, Daily, Elodia Florence., MD, 40 mg at 11/06/22 0930   guaiFENesin (ROBITUSSIN) 100 MG/5ML liquid 5 mL, 5 mL, Oral, Q4H PRN, Crosley, Debby, MD   ipratropium-albuterol (DUONEB) 0.5-2.5 (3) MG/3ML nebulizer solution 3 mL, 3 mL, Nebulization, Q4H PRN, Crosley, Debby, MD   levothyroxine (SYNTHROID) tablet 88 mcg, 88 mcg, Oral, Q0600, Elodia Florence., MD, 88 mcg at 11/06/22 0659   melatonin tablet 5 mg, 5 mg, Oral, QHS PRN, Hall, Carole N, DO, 5 mg at  11/02/22 2252   ondansetron (ZOFRAN) tablet 4 mg, 4 mg, Oral, Q8H PRN, Elodia Florence., MD, 4 mg at 11/06/22 1241   Oral care mouth rinse, 15 mL, Mouth Rinse, 4 times per day, Spero Geralds, MD, 15 mL at 11/06/22 1130   Oral care mouth rinse, 15 mL, Mouth Rinse, PRN, Spero Geralds, MD   rosuvastatin (CRESTOR) tablet 10 mg, 10 mg, Oral, Daily, Rosalin Hawking, MD, 10 mg at 11/06/22 0930   Patients Current Diet:  Diet Order                  DIET DYS 3 Room service appropriate? Yes; Fluid consistency: Nectar Thick  Diet effective now                       Precautions / Restrictions Precautions Precautions: Fall Restrictions Weight Bearing Restrictions: No    Has the patient had 2 or more falls or a fall with injury in the past year? No   Prior Activity Level Limited Community (1-2x/wk): Mod I   Prior Functional Level Self Care: Did the patient need help bathing, dressing, using the toilet or eating? Independent   Indoor Mobility: Did the patient need assistance with walking from room to room (with or without device)? Independent   Stairs: Did the patient need assistance with internal or external stairs (with or without device)? Independent   Functional Cognition: Did the patient need help planning regular tasks such as shopping or remembering to take medications? Independent   Patient Information Are you of Hispanic, Latino/a,or Spanish origin?: A. No, not of Hispanic,  Latino/a, or Spanish origin What is your race?: A. White Do you need or want an interpreter to communicate with a doctor or health care staff?: 0. No   Patient's Response To:  Health Literacy and Transportation Is the patient able to respond to health literacy and transportation needs?: Yes Health Literacy - How often do you need to have someone help you when you read instructions, pamphlets, or other written material from your doctor or pharmacy?: Never In the past 12 months, has lack of transportation kept you from medical appointments or from getting medications?: No In the past 12 months, has lack of transportation kept you from meetings, work, or from getting things needed for daily living?: No   Home Assistive Devices / Equipment Home Equipment: Conservation officer, nature (2 wheels), [employer] - single point, Contractor Use: Indicate devices/aids used by the patient prior to current illness, exacerbation or injury? None of the above   Current Functional Level Cognition   Arousal/Alertness: Awake/alert Overall Cognitive Status: Impaired/Different from baseline Current Attention Level: Selective Orientation Level: Oriented X4 General Comments: Difficulty with dual tasking Attention: Focused, Sustained, Selective Focused Attention: Appears intact Sustained Attention: Impaired Sustained Attention Impairment: Verbal complex, Functional complex Memory: Impaired Awareness: Impaired Awareness Impairment: Intellectual impairment, Emergent impairment Problem Solving: Impaired Problem Solving Impairment: Functional basic Executive Function: Self Monitoring Self Monitoring: Impaired Self Monitoring Impairment: Verbal basic, Functional basic    Extremity Assessment (includes Sensation/Coordination)   Upper Extremity Assessment: LUE deficits/detail LUE Deficits / Details: 3+/5, full ROM, impiared sensory motor coordination. Paraesthesias throughout extremeity LUE Sensation: decreased  light touch, decreased proprioception LUE Coordination: decreased gross motor, decreased fine motor  Lower Extremity Assessment: Defer to PT evaluation     ADLs   Overall ADL's : Needs assistance/impaired Eating/Feeding: NPO Grooming: Set up, Sitting Upper Body Bathing: Minimal assistance, Sitting Lower  Body Bathing: Minimal assistance, Sit to/from stand Upper Body Dressing : Minimal assistance, Sitting Lower Body Dressing: Minimal assistance, Sit to/from stand Toilet Transfer: Minimal assistance, Ambulation, Rolling walker (2 wheels), BSC/3in1 Toileting- Clothing Manipulation and Hygiene: Minimal assistance, Sitting/lateral lean Functional mobility during ADLs: Minimal assistance, Rollator (4 wheels), Rolling walker (2 wheels) General ADL Comments: limited by L hemiparesis     Mobility   Overal bed mobility: Needs Assistance Bed Mobility: Supine to Sit Supine to sit: Min assist General bed mobility comments: OOB in chair     Transfers   Overall transfer level: Needs assistance Equipment used: Rolling walker (2 wheels) Transfers: Sit to/from Stand Sit to Stand: Min assist General transfer comment: MinA from lower toilet surface. Cues for hand placement     Ambulation / Gait / Stairs / Wheelchair Mobility   Ambulation/Gait Ambulation/Gait assistance: Min Web designer (Feet): 120 Feet Assistive device: Rolling walker (2 wheels) Gait Pattern/deviations: Step-through pattern, Decreased step length - right, Decreased stride length, Trunk flexed General Gait Details: Verbal cues for larger step lengths, increased gait speed, minA for steering RW. Tendency to keep neck flexed due to vertigo Gait velocity: decreased Gait velocity interpretation: <1.31 ft/sec, indicative of household ambulator     Posture / Balance Dynamic Sitting Balance Sitting balance - Comments: indep to adjust bilateral socks sitting EOB; indep with pericare Balance Overall balance assessment: Needs  assistance Sitting-balance support: Feet supported Sitting balance-Leahy Scale: Fair Sitting balance - Comments: indep to adjust bilateral socks sitting EOB; indep with pericare Standing balance support: Bilateral upper extremity supported, During functional activity Standing balance-Leahy Scale: Poor Standing balance comment: walker and min guard for static standing     Special needs/care consideration Fall precautions due to decreased safety awareness    Previous Home Environment  Living Arrangements: Alone  Lives With: Alone Available Help at Discharge: Family, Industrial/product designer, Available 24 hours/day (DTr from [place], [place] and neighbor to arrange 24/7 supervision after CIR) Type of Home: House Home Layout: Two level, Able to live on main level with bedroom/bathroom Alternate Level Stairs-Number of Steps: 4 steps down to main living room w/ rail Home Access: Stairs to enter Entrance Stairs-Rails: Right, Left Entrance Stairs-Number of Steps: 2 Bathroom Shower/Tub: Multimedia programmer: Associate Professor Accessibility: Yes Home Care Services: No Additional Comments: Daughter to arrange 24/7 supervision; she is here with her spouse from [facility]   Discharge Living Setting Plans for Discharge Living Setting: Patient's home Type of Home at Discharge: House Discharge Home Layout: Two level, Able to live on main level with bedroom/bathroom (4 steps down to main living room) Discharge Home Access: Stairs to enter Entrance Stairs-Rails: Right, Left Entrance Stairs-Number of Steps: 2 Discharge Bathroom Shower/Tub: Walk-in shower Discharge Bathroom Toilet: Standard Discharge Bathroom Accessibility: Yes How Accessible: Accessible via walker Does the patient have any problems obtaining your medications?: No   Social/Family/Support Systems Patient Roles: Parent Contact Information: daughter, Leslie [name] Anticipated Caregiver: Leslie [name] , SIL and neighbor Anticipated Ambulance person  Information: see contacts Ability/Limitations of Caregiver: Daughter here from [place] Availability: 24/7 Discharge Plan Discussed with Primary Caregiver: Yes Is Caregiver In Agreement with Plan?: Yes Does Caregiver/Family have Issues with Lodging/Transportation while Pt is in Rehab?: No   Goals Patient/Family Goal for Rehab: Mod I to supervision with PT, OT and SLP Expected length of stay: ELOS 10 to 14 days Pt/Family Agrees to Admission and willing to participate: Yes Program Orientation Provided & Reviewed with Pt/Caregiver Including Roles  & Responsibilities: Yes   Decrease  burden of Care through IP rehab Admission: n/a   Possible need for SNF placement upon discharge: Not anticipated   Patient Condition: I have reviewed medical records from [facility], spoken with  patient, daughter, and family member. I met with patient at the bedside for inpatient rehabilitation assessment.  Patient will benefit from ongoing PT, OT, and SLP, can actively participate in 3 hours of therapy a day 5 days of the week, and can make measurable gains during the admission.  Patient will also benefit from the coordinated team approach during an Inpatient Acute Rehabilitation admission.  The patient will receive intensive therapy as well as Rehabilitation physician, nursing, social worker, and care management interventions.  Due to bladder management, bowel management, safety, skin/wound care, disease management, medication administration, pain management, and patient education the patient requires 24 hour a day rehabilitation nursing.  The patient is currently mod assist overall with mobility and basic ADLs.  Discharge setting and therapy post discharge at home with home health is anticipated.  Patient has agreed to participate in the Acute Inpatient Rehabilitation Program and will admit today.   Preadmission Screen Completed By:  Cleatrice Burke, 11/06/2022 2:30  PM ______________________________________________________________________   Discussed status with Dr. Virgie Dad on 11/08/22 at 1612 and received approval for admission today.   Admission Coordinator:  Cleatrice Burke, RN, time 5374 Date 11/08/22    Assessment/Plan: Diagnosis: Right CVA Does the need for close, 24 hr/day Medical supervision in concert with the patient's rehab needs make it unreasonable for this patient to be served in a less intensive setting? Yes Co-Morbidities requiring supervision/potential complications: CKD, prior stroke, a fib with RVR Due to bladder management, bowel management, safety, skin/wound care, disease management, medication administration, pain management, and patient education, does the patient require 24 hr/day rehab nursing? Yes Does the patient require coordinated care of a physician, rehab nurse, PT, OT, and SLP to address physical and functional deficits in the context of the above medical diagnosis(es)? Yes Addressing deficits in the following areas: balance, endurance, locomotion, strength, transferring, bowel/bladder control, bathing, dressing, feeding, grooming, toileting, cognition, speech, and psychosocial support Can the patient actively participate in an intensive therapy program of at least 3 hrs of therapy 5 days a week? Yes The potential for patient to make measurable gains while on inpatient rehab is excellent Anticipated functional outcomes upon discharge from inpatient rehab: modified independent and supervision PT, modified independent and supervision OT, modified independent and supervision SLP Estimated rehab length of stay to reach the above functional goals is: 10-14 days Anticipated discharge destination: Home 10. Overall Rehab/Functional Prognosis: excellent     MD Signature: Leslie Staggers, MD, [place] Director Rehabilitation Services 11/08/2022          Revision  History

## 2022-11-08 NOTE — TOC Transition Note (Signed)
Transition of Care [facility]) - CM/SW Discharge Note   Patient Details  Name: Leslie [name] MRN: [id number] Date of Birth: 1941-02-02  Transition of Care [facility]) CM/SW Contact:  Zenon Mayo, RN Phone Number: 11/08/2022, 4:32 PM   Clinical Narrative:     Patient for dc to CIR ,  per Pamala Hurry with CIR she has auth for bed for her.          Patient Goals and CMS Choice        Discharge Placement                       Discharge Plan and Services                                     Social Determinants of Health (SDOH) Interventions     Readmission Risk Interventions     No data to display

## 2022-11-08 NOTE — Progress Notes (Signed)
Physical Therapy Treatment Patient Details Name: Leslie [name] MRN: [id number] DOB: 1941-05-25 Today's Date: 11/08/2022   History of Present Illness Pt is an 81 y.o. female admitted 10/29/22 after being directed by PCP to ED for afib with RVR; workup also revealed CAP. 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 PMH includes CKD, stroke, arthritis, insomnia, plantar fasciitis, back sx, L THA (2022).    PT Comments    Pt eager to get up and walk with therapy. While preparing to get up 1 pill noted to be in bed. Pt is min guard in getting to EoB with heavy use of bed rail. Min guard to come to standing however pt with reports of dizziness, 3 more pills found under pt on standing. Asked pt to get back in bed due to change in vitals (see below). Pills collected and RN notified that they were in cup on computer for his review.   Orthostatic BPs                                                             BP                 HR (bpm) Sitting 91/55 108  Standing 70/48 Max noted 158  Return to Supine  115/69 114      Recommendations for follow up therapy are one component of a multi-disciplinary discharge planning process, led by the attending physician.  Recommendations may be updated based on patient status, additional functional criteria and insurance authorization.  Follow Up Recommendations  Acute inpatient rehab (3hours/day)     Assistance Recommended at Discharge Frequent or constant Supervision/Assistance  Patient can return home with the following A little help with walking and/or transfers;A little help with bathing/dressing/bathroom;Assistance with cooking/housework;Assist for transportation;Direct supervision/assist for medications management;Help with stairs or ramp for entrance   Equipment Recommendations  None recommended by PT       Precautions / Restrictions Precautions Precautions: Fall Restrictions Weight  Bearing Restrictions: No     Mobility  Bed Mobility Overal bed mobility: Needs Assistance Bed Mobility: Supine to Sit     Supine to sit: HOB elevated, Min guard     General bed mobility comments: min guard for pulling to EoB, heavy use of bedrail to pull to EoB    Transfers Overall transfer level: Needs assistance Equipment used: Rolling walker (2 wheels) Transfers: Sit to/from Stand Sit to Stand: Min guard           General transfer comment: increased time and effort, pt reports nausea in standing and sits back down    Ambulation/Gait               General Gait Details: deferred due to drop in BP and increase in HR      Modified Rankin (Stroke Patients Only) Modified Rankin (Stroke Patients Only) Pre-Morbid Rankin Score: No symptoms Modified Rankin: Moderately severe disability     Balance Overall balance assessment: Needs assistance Sitting-balance support: Feet supported Sitting balance-Leahy Scale: Fair     Standing balance support: Bilateral upper extremity supported, During functional activity Standing balance-Leahy Scale: Poor Standing balance comment: walker and min guard for static standing  Cognition Arousal/Alertness: Awake/alert Behavior During Therapy: WFL for tasks assessed/performed Overall Cognitive Status: Impaired/Different from baseline Area of Impairment: Attention, Awareness, Problem solving                   Current Attention Level: Selective       Awareness: Emergent Problem Solving: Slow processing, Requires verbal cues General Comments: Pt in bed talking to phone that shows daughter's name on the screen however daughter not on the line. pt with decreased awareness of not taking all of her pills and dropping them in the bed.           General Comments General comments (skin integrity, edema, etc.): SpO2 92% on RA      Pertinent Vitals/Pain Pain Assessment Pain  Assessment: 0-10     PT Goals (current goals can now be found in the care plan section) Acute Rehab PT Goals PT Goal Formulation: With patient Time For Goal Achievement: 11/18/22 Potential to Achieve Goals: Good Progress towards PT goals: Not progressing toward goals - comment (limited by drop in BP)    Frequency    Min 4X/week      PT Plan Current plan remains appropriate       AM-PAC PT "6 Clicks" Mobility   Outcome Measure  Help needed turning from your back to your side while in a flat bed without using bedrails?: A Little Help needed moving from lying on your back to sitting on the side of a flat bed without using bedrails?: A Little Help needed moving to and from a bed to a chair (including a wheelchair)?: A Little Help needed standing up from a chair using your arms (e.g., wheelchair or bedside chair)?: A Little Help needed to walk in hospital room?: A Little Help needed climbing 3-5 steps with a railing? : A Lot 6 Click Score: 17    End of Session Equipment Utilized During Treatment: Gait belt Activity Tolerance: Patient tolerated treatment well;Treatment limited secondary to medical complications (Comment) Patient left: in bed;with bed alarm set;with call bell/phone within reach Nurse Communication: Mobility status;Other (comment) (drop in BP and pills in bed) PT Visit Diagnosis: Other symptoms and signs involving the nervous system (R29.898);Hemiplegia and hemiparesis;Other abnormalities of gait and mobility (R26.89) Hemiplegia - Right/Left: Left Hemiplegia - dominant/non-dominant: Non-dominant Hemiplegia - caused by: Cerebral infarction     Time: 0981-1914 PT Time Calculation (min) (ACUTE ONLY): 15 min  Charges:  $Therapeutic Activity: 8-22 mins                     Kati Riggenbach B. Migdalia Dk PT, DPT Acute Rehabilitation Services Please use secure chat or  Call Office [phone]    [place] 11/08/2022, 10:17 AM

## 2022-11-08 NOTE — Progress Notes (Signed)
Inpatient Rehabilitation Admissions Coordinator   I have insurance approval and CIR bed to admit there to today; I met with patient and her daughter, alerted acute team and TOC. I will make the arrangements to admit today.  Danne Baxter, RN, MSN Rehab Admissions Coordinator [phone] 11/08/2022 4:09 PM

## 2022-11-09 DIAGNOSIS — I63131 Cerebral infarction due to embolism of right carotid artery: Secondary | ICD-10-CM | POA: Diagnosis not present

## 2022-11-09 LAB — CBC WITH DIFFERENTIAL/PLATELET
Abs Immature Granulocytes: 0.08 10*3/uL — ABNORMAL HIGH (ref 0.00–0.07)
Basophils Absolute: 0.1 10*3/uL (ref 0.0–0.1)
Basophils Relative: 1 %
Eosinophils Absolute: 0.1 10*3/uL (ref 0.0–0.5)
Eosinophils Relative: 1 %
HCT: 42.1 % (ref 36.0–46.0)
Hemoglobin: 13.5 g/dL (ref 12.0–15.0)
Immature Granulocytes: 1 %
Lymphocytes Relative: 21 %
Lymphs Abs: 3.1 10*3/uL (ref 0.7–4.0)
MCH: 27.7 pg (ref 26.0–34.0)
MCHC: 32.1 g/dL (ref 30.0–36.0)
MCV: 86.4 fL (ref 80.0–100.0)
Monocytes Absolute: 1.2 10*3/uL — ABNORMAL HIGH (ref 0.1–1.0)
Monocytes Relative: 8 %
Neutro Abs: 10.1 10*3/uL — ABNORMAL HIGH (ref 1.7–7.7)
Neutrophils Relative %: 68 %
Platelets: 367 10*3/uL (ref 150–400)
RBC: 4.87 MIL/uL (ref 3.87–5.11)
RDW: 15.3 % (ref 11.5–15.5)
WBC: 14.7 10*3/uL — ABNORMAL HIGH (ref 4.0–10.5)
nRBC: 0 % (ref 0.0–0.2)

## 2022-11-09 LAB — COMPREHENSIVE METABOLIC PANEL
ALT: 28 U/L (ref 0–44)
AST: 31 U/L (ref 15–41)
Albumin: 3.4 g/dL — ABNORMAL LOW (ref 3.5–5.0)
Alkaline Phosphatase: 80 U/L (ref 38–126)
Anion gap: 12 (ref 5–15)
BUN: 20 mg/dL (ref 8–23)
CO2: 28 mmol/L (ref 22–32)
Calcium: 9.8 mg/dL (ref 8.9–10.3)
Chloride: 99 mmol/L (ref 98–111)
Creatinine, Ser: 1.22 mg/dL — ABNORMAL HIGH (ref 0.44–1.00)
GFR, Estimated: 45 mL/min — ABNORMAL LOW (ref >60)
Glucose, Bld: 118 mg/dL — ABNORMAL HIGH (ref 70–99)
Potassium: 3.5 mmol/L (ref 3.5–5.1)
Sodium: 139 mmol/L (ref 135–145)
Total Bilirubin: 1.4 mg/dL — ABNORMAL HIGH (ref 0.3–1.2)
Total Protein: 6.8 g/dL (ref 6.5–8.1)

## 2022-11-09 MED ORDER — SENNOSIDES-DOCUSATE SODIUM 8.6-50 MG PO TABS
2.0000 | ORAL_TABLET | Freq: Two times a day (BID) | ORAL | Status: DC
  Administered 2022-11-09 – 2022-11-23 (×28): 2 via ORAL
  Filled 2022-11-09 (×28): qty 2

## 2022-11-09 NOTE — Evaluation (Signed)
Speech Language Pathology Assessment and Plan  Patient Details  Name: Leslie [name] MRN: [id number] Date of Birth: 1941-05-03  SLP Diagnosis: Dysarthria;Cognitive Impairments;Speech and Language deficits;Dysphagia  Rehab Potential: Good ELOS: 12-14 days   Today's Date: 11/09/2022 SLP Individual Time: 0910-1005 SLP Individual Time Calculation (min): 55 min  Hospital Problem: Principal Problem:   Middle cerebral artery embolism, right  Past Medical History:  Past Medical History:  Diagnosis Date   Arthritis    Back pain due to injury    fell off a ladder and fractured back-T12 down (multiple surgeries w/ onging pain)   Chronic kidney disease    "stage 3 kidney failure" improving per pt   Headache    Hypothyroidism    Insomnia    Plantar fasciitis    Stomach ulcer ~2000   Stroke ([place])    TIA X2   2001, jan 2017   Vertigo, benign paroxysmal, bilateral    Vitamin D deficiency    Past Surgical History:  Past Surgical History:  Procedure Laterality Date   ABDOMINAL HYSTERECTOMY  1974   ANKLE FUSION Right 06/07/2016   Procedure: RIGHT TALONAVICULAR ARTHRODESIS ;  Surgeon: Wylene Simmer, MD;  Location: [place];  Service: Orthopedics;  Laterality: Right;   APPENDECTOMY     BACK SURGERY     x4  fusion T12 thru sacrum; U rod in sacrum   CATARACT EXTRACTION, BILATERAL Bilateral 2015   GASTROCNEMIUS RECESSION Right 06/07/2016   Procedure: RIGHT GASTROC RECESSION ;  Surgeon: Wylene Simmer, MD;  Location: [place];  Service: Orthopedics;  Laterality: Right;   HAND SURGERY Left 2003   x2   IR ANGIO EXTRACRAN SEL COM CAROTID INNOMINATE UNI L MOD SED  11/03/2022   IR CT HEAD LTD  11/03/2022   IR PERCUTANEOUS ART THROMBECTOMY/INFUSION INTRACRANIAL INC DIAG ANGIO  11/03/2022   L heel surgery for torn tendon Left 2015   PLANTAR FASCIA SURGERY Right 2009   surgery at [place] 11/03/2022   Procedure: IR WITH ANESTHESIA;   Surgeon: Radiologist, Medication, MD;  Location: [place];  Service: Radiology;  Laterality: N/A;   SHOULDER ARTHROSCOPY Right 2007   TENDON REPAIR Right 1949   foot- lacerated tendon in arch of foot   THORACENTESIS N/A 10/30/2022   Procedure: THORACENTESIS;  Surgeon: Julian Hy, DO;  Location: [place];  Service: Cardiopulmonary;  Laterality: N/A;   TOTAL HIP ARTHROPLASTY Left 10/05/2021   Procedure: TOTAL HIP ARTHROPLASTY ANTERIOR APPROACH;  Surgeon: Rod Can, MD;  [facility];  Service: Orthopedics;  Laterality: Left;    Assessment / Plan / Recommendation Clinical Impression Leslie [name] is an 81 year old RH-female with history of prior CVA, CKD, GERD recent bouts with cough treated with atbx/steroids who was admitted with reports of 2-3 weeks of palpation, seen at PCP office for lightheadedness w/dizziness found to have A fib with RVR and sent to [facility] on 10/29/22 for evaluation. On 11/25, patient had sudden onset of right gaze deviation with left hemiparesis and left neglect. 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 Follow up MRI brain done revealing small acute infarct in right frontal, temporal lobe and caudate body with advanced chronic small vessel disease. MBS done revealing moderate spillage with buccal residue and delay in swallow with occasional trace aspiration. She was started on D3, nectar liquids but reports has been drinking "good" (  unthickened) water to clear residue. Left sided weakness improving with increase in left attention but she continues to be limited by cognitive impairments, left sensory deficits, balance deficits and dysarthria. CIR recommended due to functional decline.   Pt presents with mild-to-moderate cognitive-linguistic impairment, mild dysarthria, mild dysphonia, and moderate oropharyngeal dysphagia as evidenced per BSE and  further supported by recent MBS obtained on 11/27. Cognitive-linguistic deficits were observed in the areas of orientation to time and situation, attention, problem solving, intellectual awareness, and suspected left visual impairments. [facility] Mental Status Examination was completed to evaluate the pt's cognitive-linguistic skills. Pt achieved a score of 15/30 which is below the normal limits of 27 or more out of 30 and is suggestive of a at least mild-to-moderate impairment.   Motor speech was characterized as mildly reduced articulatory precision 2/2 left lingual and labial weakness however pt was perceived as intelligible at the conversation level for communicating basic wants, needs, thoughts, and ideas verbally. Vocal quality was perceived as slightly hoarse and with reduced intensity. Expressive/receptive language skills WFL for all tasks assessed.   Current oropharyngeal swallow function limited by anterior spillage, reduced mastication, delayed AP transit, and mild oral residuals. Pt with limited awareness of anterior spillage. Pt exhibited no overt s/sx of aspiration among all tested consistencies with clear vocal quality post swallows. Recommend continuation of current diet (dys 3, nectar thick liquids, crushed meds).   Patient would benefit from skilled SLP intervention to maximize her cognitive-linguistic, speech, and swallow functioning and overall functional independence prior to discharge.   Skilled Therapeutic Interventions          Cognitive-linguistic, speech, language, and BSE completed. Please see report for full details  SLP Assessment  Patient will need skilled Speech Lanaguage Pathology Services during CIR admission    Recommendations  SLP Diet Recommendations: Dysphagia 3 (Mech soft);Nectar Liquid Administration via: Cup;Straw Medication Administration: Crushed with puree Supervision: Patient able to self feed;Full supervision/cueing for compensatory  strategies Compensations: Slow rate;Small sips/bites;Lingual sweep for clearance of pocketing;Monitor for anterior loss;Minimize environmental distractions Postural Changes and/or Swallow Maneuvers: Seated upright 90 degrees Oral Care Recommendations: Oral care BID Recommendations for Other Services: Therapeutic Recreation consult Therapeutic Recreation Interventions: Stress management;Pet therapy Patient destination: Home Follow up Recommendations: 24 hour supervision/assistance;Home Health SLP Equipment Recommended: None recommended by SLP    SLP Frequency 3 to 5 out of 7 days   SLP Duration  SLP Intensity  SLP Treatment/Interventions 12-14 days  Minumum of 1-2 x/day, 30 to 90 minutes  Cognitive remediation/compensation;Internal/external aids;Speech/Language facilitation;Dysphagia/aspiration precaution training;Functional tasks;Patient/family education;Therapeutic Activities    Pain Pain Assessment Pain Scale: 0-10 Pain Score: 0-No pain  Prior Functioning Cognitive/Linguistic Baseline: Within functional limits Type of Home: House  Lives With: Alone;Other (Comment) (takes care of 2 dogs) Available Help at Discharge: Family;Neighbor;Available 24 hours/day Education: High school Vocation: Retired (prior Medical illustrator)  SLP Evaluation Cognition Overall Cognitive Status: Impaired/Different from baseline Arousal/Alertness: Awake/alert Orientation Level: Oriented to person;Oriented to place;Oriented to time;Disoriented to situation Year: 2023 Month: December Day of Week: Incorrect Attention: Focused;Sustained;Selective Focused Attention: Appears intact Sustained Attention: Impaired Sustained Attention Impairment: Verbal complex;Functional complex Selective Attention: Impaired Selective Attention Impairment: Verbal complex;Functional complex Memory: Impaired Memory Impairment: Storage deficit;Retrieval deficit;Decreased recall of new information Awareness:  Impaired Awareness Impairment: Intellectual impairment Problem Solving: Impaired Problem Solving Impairment: Functional basic;Verbal basic Executive Function: Organizing;Sequencing;Self Monitoring;Self Correcting Sequencing: Impaired Sequencing Impairment: Verbal basic;Functional basic Organizing: Impaired Organizing Impairment: Verbal basic;Functional basic Self Monitoring: Impaired Self Monitoring Impairment: Verbal basic;Functional  basic Self Correcting: Impaired Self Correcting Impairment: Verbal basic;Functional basic Safety/Judgment: Impaired  Comprehension Auditory Comprehension Overall Auditory Comprehension: Appears within functional limits for tasks assessed Expression Expression Primary Mode of Expression: Verbal Verbal Expression Overall Verbal Expression: Appears within functional limits for tasks assessed Oral Motor Oral Motor/Sensory Function Overall Oral Motor/Sensory Function: Moderate impairment Facial ROM: Reduced left;Suspected CN VII (facial) dysfunction Facial Symmetry: Abnormal symmetry left;Suspected CN VII (facial) dysfunction Facial Strength: Reduced left;Suspected CN VII (facial) dysfunction Facial Sensation: Reduced left;Suspected CN V (Trigeminal) dysfunction Lingual ROM: Reduced left;Suspected CN XII (hypoglossal) dysfunction Lingual Symmetry: Abnormal symmetry left;Suspected CN XII (hypoglossal) dysfunction Lingual Strength: Suspected CN XII (hypoglossal) dysfunction Velum: Within Functional Limits Mandible: Within Functional Limits Motor Speech Overall Motor Speech: Impaired Respiration: Within functional limits Phonation: Low vocal intensity;Hoarse Articulation: Impaired Level of Impairment: Word Intelligibility: Intelligible Motor Planning: Witnin functional limits Motor Speech Errors: Aware;Consistent  Care Tool Care Tool Cognition Ability to hear (with hearing aid or hearing appliances if normally used Ability to hear (with hearing aid  or hearing appliances if normally used): 0. Adequate - no difficulty in normal conservation, social interaction, listening to TV   Expression of Ideas and Wants Expression of Ideas and Wants: 3. Some difficulty - exhibits some difficulty with expressing needs and ideas (e.g, some words or finishing thoughts) or speech is not clear   Understanding Verbal and Non-Verbal Content Understanding Verbal and Non-Verbal Content: 3. Usually understands - understands most conversations, but misses some part/intent of message. Requires cues at times to understand  Memory/Recall Ability Memory/Recall Ability : Current season;That he or she is in a hospital/hospital unit   Intelligibility: Intelligible  Bedside Swallowing Assessment General Date of Onset: 11/03/22 Previous Swallow Assessment: MBS on 11/05/22 Diet Prior to this Study: Dysphagia 2 (chopped);Nectar-thick liquids History of Recent Intubation: No Behavior/Cognition: Alert;Cooperative;Pleasant mood Oral Cavity - Dentition: Adequate natural dentition Self-Feeding Abilities: Able to feed self Vision: Functional for self-feeding Patient Positioning: Upright in bed Baseline Vocal Quality: Hoarse;Low vocal intensity Volitional Cough: Strong Volitional Swallow: Able to elicit  Oral Care Assessment Oral Assessment  (WDL): Within Defined Limits Lips: Asymmetrical Teeth: Intact Tongue: Pink;Moist Mucous Membrane(s): Moist;Pink Saliva: Moist, saliva free flowing Level of Consciousness: Alert Is patient on any of following O2 devices?: None of the above Nutritional status: On thickened liquids Oral Assessment Risk : High Risk Ice Chips Ice chips: Not tested Thin Liquid Thin Liquid: Not tested Nectar Thick Nectar Thick Liquid: Impaired Presentation: Cup Oral phase functional implications: Left anterior spillage Honey Thick Honey Thick Liquid: Not tested Puree Puree: Impaired Oral Phase Functional Implications: Left anterior  spillage Solid Solid: Impaired Oral Phase Functional Implications: Impaired mastication;Prolonged oral transit BSE Assessment Risk for Aspiration Impact on safety and function: Mild aspiration risk;Moderate aspiration risk Other Related Risk Factors: Deconditioning;Decreased respiratory status  Short Term Goals: Week 1: SLP Short Term Goal 1 (Week 1): Pt will consume current diet with minimal overt s/sx of aspiration and min A verbal cues for implementation of safe swallowing precautions/strategies SLP Short Term Goal 2 (Week 1): Patient will recall and demonstrate use of memory compensations to increase independence and safety at home with mod A verbal cues. SLP Short Term Goal 3 (Week 1): Patient will demonstrate functional problem solving for mildly complex tasks with Mod A verbal cues. SLP Short Term Goal 4 (Week 1): Patient will demonstrate sustained attention to a functional task for 10 minutes with min A verbal cues for redirection. SLP Short Term Goal 5 (Week 1): Pt will  demonstrate intellectual awareness of deficits by verbalizing cause of hospitalization (stroke) and current deficits with mod A verbal cues  Refer to Care Plan for Long Term Goals  Recommendations for other services: Therapeutic Recreation  Pet therapy and Stress management  Discharge Criteria: Patient will be discharged from SLP if patient refuses treatment 3 consecutive times without medical reason, if treatment goals not met, if there is a change in medical status, if patient makes no progress towards goals or if patient is discharged from hospital.  The above assessment, treatment plan, treatment alternatives and goals were discussed and mutually agreed upon: by patient  Patty Sermons 11/09/2022, 3:47 PM

## 2022-11-09 NOTE — Plan of Care (Signed)
  Problem: RH Balance Goal: LTG Patient will maintain dynamic standing balance (PT) Description: LTG:  Patient will maintain dynamic standing balance with assistance during mobility activities (PT) Flowsheets (Taken 11/09/2022 1628) LTG: Pt will maintain dynamic standing balance during mobility activities with:: Supervision/Verbal cueing   Problem: Sit to Stand Goal: LTG:  Patient will perform sit to stand with assistance level (PT) Description: LTG:  Patient will perform sit to stand with assistance level (PT) Flowsheets (Taken 11/09/2022 1628) LTG: PT will perform sit to stand in preparation for functional mobility with assistance level: Supervision/Verbal cueing   Problem: RH Bed Mobility Goal: LTG Patient will perform bed mobility with assist (PT) Description: LTG: Patient will perform bed mobility with assistance, with/without cues (PT). Flowsheets (Taken 11/09/2022 1628) LTG: Pt will perform bed mobility with assistance level of: Independent   Problem: RH Bed to Chair Transfers Goal: LTG Patient will perform bed/chair transfers w/assist (PT) Description: LTG: Patient will perform bed to chair transfers with assistance (PT). Flowsheets (Taken 11/09/2022 1628) LTG: Pt will perform Bed to Chair Transfers with assistance level: Supervision/Verbal cueing   Problem: RH Car Transfers Goal: LTG Patient will perform car transfers with assist (PT) Description: LTG: Patient will perform car transfers with assistance (PT). Flowsheets (Taken 11/09/2022 1628) LTG: Pt will perform car transfers with assist:: Contact Guard/Touching assist   Problem: RH Furniture Transfers Goal: LTG Patient will perform furniture transfers w/assist (OT/PT) Description: LTG: Patient will perform furniture transfers  with assistance (OT/PT). Flowsheets (Taken 11/09/2022 1628) LTG: Pt will perform furniture transfers with assist:: Supervision/Verbal cueing   Problem: RH Ambulation Goal: LTG Patient will ambulate in  controlled environment (PT) Description: LTG: Patient will ambulate in a controlled environment, # of feet with assistance (PT). Flowsheets (Taken 11/09/2022 1628) LTG: Pt will ambulate in controlled environ  assist needed:: Supervision/Verbal cueing LTG: Ambulation distance in controlled environment: 100 ft using LRAD Goal: LTG Patient will ambulate in home environment (PT) Description: LTG: Patient will ambulate in home environment, # of feet with assistance (PT). Flowsheets (Taken 11/09/2022 1628) LTG: Pt will ambulate in home environ  assist needed:: Supervision/Verbal cueing LTG: Ambulation distance in home environment: up to 50 ft using LRAD   Problem: RH Stairs Goal: LTG Patient will ambulate up and down stairs w/assist (PT) Description: LTG: Patient will ambulate up and down # of stairs with assistance (PT) Flowsheets (Taken 11/09/2022 1628) LTG: Pt will ambulate up/down stairs assist needed:: Contact Guard/Touching assist LTG: Pt will  ambulate up and down number of stairs: at least 2 steps using BHR as per home entry and 4 steps with one HR into LR

## 2022-11-09 NOTE — Progress Notes (Signed)
Inpatient Rehabilitation Care Coordinator Assessment and Plan Patient Details  Name: Leslie [name] MRN: [id number] Date of Birth: [birth year]  Today's Date: 11/09/2022  Hospital Problems: Principal Problem:   Middle cerebral artery embolism, right  Past Medical History:  Past Medical History:  Diagnosis Date   Arthritis    Back pain due to injury    fell off a ladder and fractured back-T12 down (multiple surgeries w/ onging pain)   Chronic kidney disease    "stage 3 kidney failure" improving per pt   Headache    Hypothyroidism    Insomnia    Plantar fasciitis    Stomach ulcer ~2000   Stroke [facility])    TIA X2   2001, jan 2017   Vertigo, benign paroxysmal, bilateral    Vitamin D deficiency    Past Surgical History:  Past Surgical History:  Procedure Laterality Date   ABDOMINAL HYSTERECTOMY  1974   ANKLE FUSION Right 06/07/2016   Procedure: RIGHT TALONAVICULAR ARTHRODESIS ;  Surgeon: Wylene Simmer, MD;  Location: [place];  Service: Orthopedics;  Laterality: Right;   APPENDECTOMY     BACK SURGERY     x4  fusion T12 thru sacrum; U rod in sacrum   CATARACT EXTRACTION, BILATERAL Bilateral 2015   GASTROCNEMIUS RECESSION Right 06/07/2016   Procedure: RIGHT GASTROC RECESSION ;  Surgeon: Wylene Simmer, MD;  Location: [place];  Service: Orthopedics;  Laterality: Right;   HAND SURGERY Left 2003   x2   IR ANGIO EXTRACRAN SEL COM CAROTID INNOMINATE UNI L MOD SED  11/03/2022   IR CT HEAD LTD  11/03/2022   IR PERCUTANEOUS ART THROMBECTOMY/INFUSION INTRACRANIAL INC DIAG ANGIO  11/03/2022   L heel surgery for torn tendon Left 2015   PLANTAR FASCIA SURGERY Right 2009   surgery at [place] 11/03/2022   Procedure: IR WITH ANESTHESIA;  Surgeon: Radiologist, Medication, MD;  Location: [place];  Service: Radiology;  Laterality: N/A;   SHOULDER ARTHROSCOPY Right 2007   TENDON REPAIR Right 1949   foot- lacerated tendon in arch of  foot   THORACENTESIS N/A 10/30/2022   Procedure: THORACENTESIS;  Surgeon: Julian Hy, DO;  Location: [place];  Service: Cardiopulmonary;  Laterality: N/A;   TOTAL HIP ARTHROPLASTY Left 10/05/2021   Procedure: TOTAL HIP ARTHROPLASTY ANTERIOR APPROACH;  Surgeon: Rod Can, MD;  [facility];  Service: Orthopedics;  Laterality: Left;   Social History:  reports that she quit smoking about 36 years ago. Her smoking use included cigarettes. She has never used smokeless tobacco. She reports that she does not drink alcohol and does not use drugs.  Family / Support Systems Children: [name], daughter Other Supports: Agricultural consultant, brother Anticipated Caregiver: Daughter Jeannene [place]), SIL and neighbor Ability/Limitations of Caregiver: Daughter visiting from [place] and plans to arrange 24/7 Caregiver Availability: 24/7 Family Dynamics: support from daughter, SIL, neighbors and other family members  Social History Preferred language: English Religion: [employer] - How often do you need to have someone help you when you read instructions, pamphlets, or other written material from your doctor or pharmacy?: Never Writes: Yes   Abuse/Neglect Abuse/Neglect Assessment Can Be Completed: Yes Physical Abuse: Denies Verbal Abuse: Denies Sexual Abuse: Denies Exploitation of patient/patient's resources: Denies Self-Neglect: Denies  Patient response to: Social Isolation - How often do you feel lonely or isolated from those around you?: Never  Emotional Status Recent Psychosocial Issues: coping Psychiatric History: n/a Substance Abuse History: n/a  Patient /  Family Perceptions, Expectations & Goals Pt/Family understanding of illness & functional limitations: yes Premorbid pt/family roles/activities: Previously Independent and living alone Anticipated changes in roles/activities/participation: Daughter and SIL able to assist temporarily until returning home. Neighbor has offered to  assist. Daughter potentially arraging 24/7. Pt/family expectations/goals: MOD I to Supervision  [employer]: None Premorbid Home Care/DME Agencies: Other (Comment) Librarian, academic, Radio producer and Civil engineer, contracting) Transportation available at discharge: daughter and SIL currently here from [employer], neighbor Is the patient able to respond to transportation needs?: Yes In the past 12 months, has lack of transportation kept you from medical appointments or from getting medications?: No In the past 12 months, has lack of transportation kept you from meetings, work, or from getting things needed for daily living?: No Resource referrals recommended: Neuropsychology  Discharge Planning Living Arrangements: Alone Support Systems: Children, Water engineer (Daughter, SIL and neighbor) Type of Residence: Private residence (2 level home, able to maintain on main level.) Administrator, sports: Multimedia programmer (specify) Personnel officer Medicare) Financial Resources: Radio broadcast assistant Screen Referred: No Living Expenses: Own Money Management: Patient Does the patient have any problems obtaining your medications?: No Home Management: Independent Patient/Family Preliminary Plans: [place] to continue to manage. If unable daughter and SIL Care Coordinator Barriers to Discharge: Insurance for SNF coverage, Decreased caregiver support, Lack of/limited family support, Home environment access/layout Care Coordinator Anticipated Follow Up Needs: HH/OP Expected length of stay: 10-14 Days  Clinical Impression Sw met with patient and daughter, [name] in room and provided team conference updates. Patient daughter here from [place] and anticipates to remain here throughout the duration of the patients stay. Daughter in the process of having family assist her with supervision at home on a weekly basis. No additional questions or concerns.  Dyanne Iha 11/09/2022, 12:24 PM

## 2022-11-09 NOTE — Progress Notes (Signed)
Inpatient Rehabilitation Admission Medication Review by a Pharmacist  A complete drug regimen review was completed for this patient to identify any potential clinically significant medication issues.  High Risk Drug Classes Is patient taking? Indication by Medication  Antipsychotic Yes PRN prochlorperazine - n/v  Anticoagulant Yes Apixaban - atrial fibrillation and stroke prevention  Antibiotic No   Opioid No   Antiplatelet No   Hypoglycemics/insulin No   Vasoactive Medication Yes Diltiazem CD - atrial fibrillation and blood pressure  Chemotherapy No   Other Yes Cyanocobalamin - supplement Famotidine - GI prophylaxis Levothyroxine - thyroid supplement Rosuvastatin - hyperlipidemia Senokot S - laxative Spironolactone - diuretic   PRNs: Acetaminophen - pain Maalox - indigestion Calcium carbonate - indigestion and heartburn Bisacodyl - laxative Guaifenesin DM - cough Melatonin - sleep     Type of Medication Issue Identified Description of Issue Recommendation(s)  Drug Interaction(s) (clinically significant)     Duplicate Therapy  Senokot S 2 tablets qhs and 1 tablet BID ordered. Discontinue one Senokot S order  Allergy     No Medication Administration End Date     Incorrect Dose     Additional Drug Therapy Needed     Significant med changes from prior encounter (inform family/care partners about these prior to discharge).    Other  Had been on Furosemide 40 mg PO daily while inpatient > noted plan to change to Spironolactone on 11/09/22 and use furosemide prn for fluid overload.     Clinically significant medication issues were identified that warrant physician communication and completion of prescribed/recommended actions by midnight of the next day:  No  Name of provider notified for urgent issues identified: n/a  Provider Method of Notification: n/a   Pharmacist comments:   Time spent performing this drug regimen review (minutes):  20   Arty Baumgartner, [place] 11/09/2022 8:10 AM

## 2022-11-09 NOTE — Progress Notes (Signed)
[facility] Individual Statement of Services  Patient Name:  Leslie [name]  Date:  11/09/2022  Welcome to the [facility].  Our goal is to provide you with an individualized program based on your diagnosis and situation, designed to meet your specific needs.  With this comprehensive rehabilitation program, you will be expected to participate in at least 3 hours of rehabilitation therapies Monday-Friday, with modified therapy programming on the weekends.  Your rehabilitation program will include the following services:  Physical Therapy (PT), Occupational Therapy (OT), Speech Therapy (ST), 24 hour per day rehabilitation nursing, Therapeutic Recreaction (TR), Neuropsychology, Care Coordinator, Rehabilitation Medicine, Nutrition Services, Pharmacy Services, and Other  Weekly team conferences will be held on Wednesdays to discuss your progress.  Your Inpatient Rehabilitation Care Coordinator will talk with you frequently to get your input and to update you on team discussions.  Team conferences with you and your family in attendance may also be held.  Expected length of stay: 10-14 Days  Overall anticipated outcome: Mod I to supervision   Depending on your progress and recovery, your program may change. Your Inpatient Rehabilitation Care Coordinator will coordinate services and will keep you informed of any changes. Your Inpatient Rehabilitation Care Coordinator's name and contact numbers are listed  below.  The following services may also be recommended but are not provided by the [facility]:   [place] will be made to provide these services after discharge if needed.  Arrangements include referral to agencies that provide these services.  Your insurance has been verified to be:  [employer] Your primary doctor is:  Dustin Folks, FNP  Pertinent  information will be shared with your doctor and your insurance company.  Inpatient Rehabilitation Care Coordinator:  Erlene Quan, [place] or [phone]  Information discussed with and copy given to patient by: Dyanne Iha, 11/09/2022, 11:10 AM

## 2022-11-09 NOTE — IPOC Note (Signed)
Overall Plan of Care [facility]) Patient Details Name: Leslie [name] MRN: [id number] DOB: [birth year]  Admitting Diagnosis: Middle cerebral artery embolism, right  Hospital Problems: Principal Problem:   Middle cerebral artery embolism, right     Functional Problem List: Nursing Bowel, Bladder, Safety, Endurance, Medication Management  PT Balance, Endurance, Motor, Nutrition, Pain, Perception, Safety, Sensory, Skin Integrity  OT Balance, Endurance, Perception, Vision, Motor, Safety, Cognition, Sensory, Skin Integrity, Pain  SLP Cognition, Linguistic, Motor, Nutrition, Safety, Perception  TR         Basic ADL's: OT Grooming, Bathing, Dressing, Toileting     Advanced  ADL's: OT Simple Meal Preparation, Laundry     Transfers: PT Bed Mobility, Bed to Chair, Musician, Manufacturing systems engineer, Metallurgist: PT Ambulation, Emergency planning/management officer, Stairs     Additional Impairments: OT    SLP Swallowing, Communication, Social Cognition expression Problem Solving, Awareness, Memory, Attention  TR      Anticipated Outcomes Item Anticipated Outcome  Self Feeding Independent  Swallowing  sup A   Basic self-care  Media planner Transfers Supervision with LRAD  Bowel/Bladder  manage bowel w mod I and bladder w toileting  Transfers  supervision  Locomotion  supervision once BPPV resolved  Communication  mod I  Cognition  min A  Pain  n/a  Safety/Judgment  manage with cues   Therapy Plan: PT Intensity: Minimum of 1-2 x/day ,45 to 90 minutes PT Frequency: 5 out of 7 days PT Duration Estimated Length of Stay: 12-14 days OT Intensity: Minimum of 1-2 x/day, 45 to 90 minutes OT Frequency: 5 out of 7 days OT Duration/Estimated Length of Stay: 2 weeks SLP Intensity: Minumum of 1-2 x/day, 30 to 90 minutes SLP Frequency: 3 to 5 out of 7 days SLP Duration/Estimated Length of Stay: 12-14 days   Team Interventions: Nursing  Interventions Bladder Management, Bowel Management, Medication Management, Dysphagia/Aspiration Precaution Training, Disease Management/Prevention, Patient/Family Education, Discharge Planning  PT interventions Ambulation/gait training, Community reintegration, DME/adaptive equipment instruction, Neuromuscular re-education, Psychosocial support, Stair training, UE/LE Strength taining/ROM, Wheelchair propulsion/positioning, UE/LE Coordination activities, Therapeutic Activities, Skin care/wound management, Pain management, Discharge planning, Training and development officer, Cognitive remediation/compensation, Disease management/prevention, Functional mobility training, Patient/family education, Splinting/orthotics, Therapeutic Exercise, Visual/perceptual remediation/compensation  OT Interventions Training and development officer, Community reintegration, Disease mangement/prevention, Neuromuscular re-education, Patient/family education, Self Care/advanced ADL retraining, Therapeutic Exercise, UE/LE Coordination activities, Wheelchair propulsion/positioning, Cognitive remediation/compensation, Discharge planning, DME/adaptive equipment instruction, Functional mobility training, Pain management, Psychosocial support, Skin care/wound managment, Therapeutic Activities, Visual/perceptual remediation/compensation, UE/LE Strength taining/ROM  SLP Interventions Cognitive remediation/compensation, Internal/external aids, Speech/Language facilitation, Dysphagia/aspiration precaution training, Functional tasks, Patient/family education, Therapeutic Activities  TR Interventions    SW/CM Interventions Discharge Planning, Psychosocial Support, Patient/Family Education, Disease Management/Prevention   Barriers to Discharge MD   Poor safety awareness due to perceptual deficits  Nursing Lack of/limited family support, Home environment access/layout 1 level 2 ste bil rail; 4 step down to living room solo; dtr to assist prn  PT  Inaccessible home environment, Decreased caregiver support, Home environment access/layout, Lack of/limited family support, Insurance underwriter for SNF coverage, Nutrition means    OT      SLP      SW Insurance for SNF coverage, Decreased caregiver support, Lack of/limited family support, Home environment Engineer, maintenance Discharge Planning: Destination: PT-Home ,OT- Home , SLP-Home Projected Follow-up: PT-Home health PT, Outpatient PT, 24 hour supervision/assistance, OT-  Outpatient OT, SLP-24 hour supervision/assistance, Home Health SLP Projected Equipment  Needs: PT-To be determined, OT- Tub/shower seat, To be determined, 3 in 1 bedside comode, SLP-None recommended by SLP Equipment Details: PT- , OT-  Patient/family involved in discharge planning: PT- Patient, Family member/caregiver,  OT-Patient, SLP-Patient  MD ELOS: 12-14d Medical Rehab Prognosis:  Good Assessment: The patient has been admitted for CIR therapies with the diagnosis of Right MCA infarct. 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 Goals have been set at Sup. Anticipated discharge destination is Home .        See Team Conference Notes for weekly updates to the plan of care

## 2022-11-09 NOTE — Plan of Care (Signed)
  Problem: RH Expression Communication Goal: LTG Patient will increase speech intelligibility (SLP) Description: LTG: Patient will increase speech intelligibility at word/phrase/conversation level with cues, % of the time (SLP) Flowsheets (Taken 11/09/2022 1549) LTG: Patient will increase speech intelligibility (SLP): Modified Independent Level: Conversation level   Problem: RH Problem Solving Goal: LTG Patient will demonstrate problem solving for (SLP) Description: LTG:  Patient will demonstrate problem solving for basic/complex daily situations with cues  (SLP) Flowsheets (Taken 11/09/2022 1549) LTG: Patient will demonstrate problem solving for (SLP): Complex daily situations LTG Patient will demonstrate problem solving for: Minimal Assistance - Patient > 75%   Problem: RH Memory Goal: LTG Patient will demonstrate ability for day to day (SLP) Description: LTG:   Patient will demonstrate ability for day to day recall/carryover during cognitive/linguistic activities with assist  (SLP) Flowsheets (Taken 11/09/2022 1549) LTG: Patient will demonstrate ability for day to day recall: New information LTG: Patient will demonstrate ability for day to day recall/carryover during cognitive/linguistic activities with assist (SLP): Minimal Assistance - Patient > 75% Goal: LTG Patient will use memory compensatory aids to (SLP) Description: LTG:  Patient will use memory compensatory aids to recall biographical/new, daily complex information with cues (SLP) Flowsheets (Taken 11/09/2022 1549) LTG: Patient will use memory compensatory aids to (SLP): Supervision   Problem: RH Attention Goal: LTG Patient will demonstrate this level of attention during functional activites (SLP) Description: LTG:  Patient will will demonstrate this level of attention during functional activites (SLP) Flowsheets (Taken 11/09/2022 1549) Patient will demonstrate during cognitive/linguistic activities the attention type of:  Selective LTG: Patient will demonstrate this level of attention during cognitive/linguistic activities with assistance of (SLP): Minimal Assistance - Patient > 75%   Problem: RH Awareness Goal: LTG: Patient will demonstrate awareness during functional activites type of (SLP) Description: LTG: Patient will demonstrate awareness during functional activites type of (SLP) Flowsheets (Taken 11/09/2022 1549) Patient will demonstrate during cognitive/linguistic activities awareness type of: Emergent LTG: Patient will demonstrate awareness during cognitive/linguistic activities with assistance of (SLP): Minimal Assistance - Patient > 75%

## 2022-11-09 NOTE — Evaluation (Signed)
Occupational Therapy Assessment and Plan  Patient Details  Name: Leslie [name] MRN: [id number] Date of Birth: [birth year]  OT Diagnosis: cognitive deficits, disturbance of vision, hemiplegia affecting non-dominant side, muscle weakness (generalized), and BPPV Rehab Potential: Rehab Potential (ACUTE ONLY): Good ELOS: 2 weeks   Today's Date: 11/09/2022 OT Individual Time: 1101-1209 OT Individual Time Calculation (min): 68 min     Hospital Problem: Principal Problem:   Middle cerebral artery embolism, right   Past Medical History:  Past Medical History:  Diagnosis Date   Arthritis    Back pain due to injury    fell off a ladder and fractured back-T12 down (multiple surgeries w/ onging pain)   Chronic kidney disease    "stage 3 kidney failure" improving per pt   Headache    Hypothyroidism    Insomnia    Plantar fasciitis    Stomach ulcer ~2000   Stroke ([place])    TIA X2   2001, jan 2017   Vertigo, benign paroxysmal, bilateral    Vitamin D deficiency    Past Surgical History:  Past Surgical History:  Procedure Laterality Date   ABDOMINAL HYSTERECTOMY  1974   ANKLE FUSION Right 06/07/2016   Procedure: RIGHT TALONAVICULAR ARTHRODESIS ;  Surgeon: Wylene Simmer, MD;  Location: [facility];  Service: Orthopedics;  Laterality: Right;   APPENDECTOMY     BACK SURGERY     x4  fusion T12 thru sacrum; U rod in sacrum   CATARACT EXTRACTION, BILATERAL Bilateral 2015   GASTROCNEMIUS RECESSION Right 06/07/2016   Procedure: RIGHT GASTROC RECESSION ;  Surgeon: Wylene Simmer, MD;  Location: [facility];  Service: Orthopedics;  Laterality: Right;   HAND SURGERY Left 2003   x2   IR ANGIO EXTRACRAN SEL COM CAROTID INNOMINATE UNI L MOD SED  11/03/2022   IR CT HEAD LTD  11/03/2022   IR PERCUTANEOUS ART THROMBECTOMY/INFUSION INTRACRANIAL INC DIAG ANGIO  11/03/2022   L heel surgery for torn tendon Left 2015   PLANTAR FASCIA SURGERY Right 2009   surgery at [facility] 11/03/2022   Procedure: IR WITH ANESTHESIA;  Surgeon: Radiologist, Medication, MD;  Location: [facility];  Service: Radiology;  Laterality: N/A;   SHOULDER ARTHROSCOPY Right 2007   TENDON REPAIR Right 1949   foot- lacerated tendon in arch of foot   THORACENTESIS N/A 10/30/2022   Procedure: THORACENTESIS;  Surgeon: Julian Hy, DO;  Location: [facility];  Service: Cardiopulmonary;  Laterality: N/A;   TOTAL HIP ARTHROPLASTY Left 10/05/2021   Procedure: TOTAL HIP ARTHROPLASTY ANTERIOR APPROACH;  Surgeon: Rod Can, MD;  Location: [facility];  Service: Orthopedics;  Laterality: Left;    Assessment & Plan Clinical Impression: 81 year old female with history of hypothyroidism and GERD. Recently had respiratory issues from a blocked up bathroom . Her PCP started her on po steroids and antibiotics. She developed palpitations. She returned to her PCP where an EKG revealed afib with RVR and she presented to ED on 10/29/22.    Placed on Eliquis but developed acute onset left facial droop, left sided weakness, left neglect and right gaze preference with moderate dysarthria. MRI revealed small area of acute infarct in the right frontal and temporal lobe cortex and caudate body without hemorrhage or mass effect. 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 2 D echo with EF 40 tp 45%. New diagnosis of Afib with cardiology consulted. Off amiodarone  and on Cardizem. Rate controlled. LDL 86 and began Crestor. NIHSS total: 3 Patient transferred to CIR on 11/08/2022 .    Patient currently requires min-max A with basic self-care skills secondary to muscle weakness, decreased cardiorespiratoy endurance, decreased coordination and decreased motor planning, decreased visual perceptual skills and decreased visual motor skills, decreased attention to left, decreased initiation, decreased attention, decreased awareness, decreased problem solving,  decreased safety awareness, decreased memory, and delayed processing, central origin, and decreased sitting balance, decreased standing balance, and decreased balance strategies.  Prior to hospitalization, patient could complete BADLs and IADLs with modified independent .  Patient will benefit from skilled intervention to decrease level of assist with basic self-care skills, increase independence with basic self-care skills, and increase level of independence with iADL prior to discharge home with care partner.  Anticipate patient will require 24 hour supervision and follow up outpatient.  OT - End of Session Activity Tolerance: Decreased this session Endurance Deficit: Yes OT Assessment Rehab Potential (ACUTE ONLY): Good OT Patient demonstrates impairments in the following area(s): Balance;Endurance;Perception;Vision;Motor;Safety;Cognition;Sensory;Skin Integrity;Pain OT Basic ADL's Functional Problem(s): Grooming;Bathing;Dressing;Toileting OT Advanced ADL's Functional Problem(s): Simple Meal Preparation;Laundry OT Transfers Functional Problem(s): Toilet;Tub/Shower OT Plan OT Intensity: Minimum of 1-2 x/day, 45 to 90 minutes OT Frequency: 5 out of 7 days OT Duration/Estimated Length of Stay: 2 weeks OT Treatment/Interventions: Balance/vestibular training;Community reintegration;Disease mangement/prevention;Neuromuscular re-education;Patient/family education;Self Care/advanced ADL retraining;Therapeutic Exercise;UE/LE Coordination activities;Wheelchair propulsion/positioning;Cognitive remediation/compensation;Discharge planning;DME/adaptive equipment instruction;Functional mobility training;Pain management;Psychosocial support;Skin care/wound managment;Therapeutic Activities;Visual/perceptual remediation/compensation;UE/LE Strength taining/ROM OT Self Feeding Anticipated Outcome(s): Independent OT Basic Self-Care Anticipated Outcome(s): Supervision OT Toileting Anticipated Outcome(s):  Supervision OT Bathroom Transfers Anticipated Outcome(s): Supervision with LRAD OT Recommendation Patient destination: Home Follow Up Recommendations: Outpatient OT Equipment Recommended: Tub/shower seat;To be determined;3 in 1 bedside comode   OT Evaluation Precautions/Restrictions  Precautions Precautions: Fall Precaution Comments: dys3, nectar, BPPV, L inattention Restrictions Weight Bearing Restrictions: No General Chart Reviewed: Yes Additional Pertinent History: Vertigo Family/Caregiver Present: Yes Vital Signs   Pain Pain Assessment Pain Scale: 0-10 Pain Score: 0-No pain Home Living/Prior Functioning Home Living Family/patient expects to be discharged to:: Private residence Living Arrangements: Alone Available Help at Discharge: Family, Industrial/product designer, Available 24 hours/day Type of Home: House Home Access: Stairs to enter Technical brewer of Steps: 2 Entrance Stairs-Rails: Right, Left Home Layout: Two level, Able to live on main level with bedroom/bathroom Alternate Level Stairs-Number of Steps: 4 steps down to main living room w/ rail Bathroom Shower/Tub: Multimedia programmer: Standard Bathroom Accessibility: Yes Additional Comments: Daughter to arrange 24/7 supervision; she is here with her spouse from [place] With: Alone, Other (Comment) IADL History Homemaking Responsibilities: Yes Meal Prep Responsibility: Primary Laundry Responsibility: Primary Cleaning Responsibility: Primary Bill Paying/Finance Responsibility: Primary Shopping Responsibility: Primary Child Care Responsibility: Primary Current License: Yes Education: High school Occupation: Retired Prior Function Level of Independence: Independent with homemaking with ambulation, Requires assistive device for independence, Independent with gait, Independent with basic ADLs  Able to [place]?: Yes Driving: Yes Vocation: Retired Surveyor, mining Baseline Vision/History: 1 Wears  glasses Ability to See in Adequate Light: 1 Impaired Patient Visual Report: Nausea/blurring vision with head movement Vision Assessment?: Vision impaired- to be further tested in functional context Alignment/Gaze Preference: Gaze right Tracking/Visual Pursuits: Right eye does not track laterally;Left eye does not track laterally;Decreased smoothness of horizontal tracking;Requires cues, head turns, or add eye shifts to track Saccades: Additional eye shifts occurred during testing;Undershoots Convergence: Impaired - to be further tested in functional context Depth Perception: Undershoots Additional Comments:  L innation, some dizziness; challenges following directions for visual assessment Perception  Perception: Impaired Inattention/Neglect: Does not attend to left side of body;Does not attend to left visual field Praxis Praxis: Impaired Praxis-Other Comments: Difficult to assess during evaluation d/t Pt nausea symptoms; to be furhter assessed in functional context Cognition Cognition Overall Cognitive Status: Impaired/Different from baseline Arousal/Alertness: Awake/alert Orientation Level: Person;Place Memory: Impaired Memory Impairment: Storage deficit;Retrieval deficit;Decreased recall of new information Attention: Sustained Focused Attention: Appears intact Sustained Attention: Impaired Sustained Attention Impairment: Verbal complex;Functional complex Selective Attention: Impaired Selective Attention Impairment: Verbal complex;Functional complex Awareness: Impaired Awareness Impairment: Intellectual impairment Problem Solving: Impaired Problem Solving Impairment: Functional basic;Verbal basic Executive Function: Initiating Sequencing: Impaired Sequencing Impairment: Verbal basic;Functional basic Organizing: Impaired Organizing Impairment: Verbal basic;Functional basic Initiating: Impaired Self Monitoring: Impaired Self Monitoring Impairment: Verbal basic;Functional  basic Self Correcting: Impaired Self Correcting Impairment: Verbal basic;Functional basic Safety/Judgment: Impaired Brief Interview for Mental Status (BIMS) Repetition of Three Words (First Attempt): 3 Temporal Orientation: Year: Missed by 2 to 5 years Temporal Orientation: Month: Missed by 6 days to 1 month Temporal Orientation: Day: Incorrect Recall: "Sock": No, could not recall Recall: "Blue": Yes, no cue required Recall: "Bed": No, could not recall BIMS Summary Score: 7 Sensation Sensation Light Touch: Impaired by gross assessment Hot/Cold: Not tested Proprioception: Impaired by gross assessment Stereognosis: Impaired by gross assessment Additional Comments: dmininished in LUE compaired to RUE; unable to follow directions to keep eyes closed during assessment; to be further assessed in functional context Coordination Gross Motor Movements are Fluid and Coordinated: No Fine Motor Movements are Fluid and Coordinated: No Finger Nose Finger Test: undrshooting with L>R, dysmetria noted in LUE Heel Shin Test: very slow to initiate L>R, performed in supine Motor  Motor Motor: Other (comment) Motor - Skilled Clinical Observations: L hemipareisis, slow to initiate with L, minimal coordination deficits.  Trunk/Postural Assessment  Cervical Assessment Cervical Assessment: Exceptions to [facility] (forward head) Thoracic Assessment Thoracic Assessment: Exceptions to [facility] (rounding of shoulders) Lumbar Assessment Lumbar Assessment: Within Functional Limits Postural Control Postural Control: Within Functional Limits  Balance Balance Balance Assessed: Yes Static Sitting Balance Static Sitting - Balance Support: Feet supported;No upper extremity supported Static Sitting - Level of Assistance: 5: Stand by assistance Dynamic Sitting Balance Dynamic Sitting - Balance Support: Feet supported;Right upper extremity supported Dynamic Sitting - Level of Assistance: 5: Stand by assistance Dynamic  Sitting - Balance Activities: Reaching for objects;Lateral lean/weight shifting;Reaching across midline;Forward lean/weight shifting Sitting balance - Comments: maintains in reaching for objects, adjusting positioning on EOB and in performing squat pivot transfer following instructions for hand/ foot placement Static Standing Balance Static Standing - Balance Support: Bilateral upper extremity supported Static Standing - Level of Assistance: 4: Min assist (unable to maintain stance for >3sec d/t increased dizziness and nausea from BPPV) Extremity/Trunk Assessment RUE Assessment RUE Assessment: Within Functional Limits General Strength Comments: 5/5 strength LUE Assessment LUE Assessment: Within Functional Limits General Strength Comments: 5/5 strength; increased time required to inititated movements; delayed/slow motor movements  Care Tool Care Tool Self Care Eating   Eating Assist Level: Supervision/Verbal cueing    Oral Care    Oral Care Assist Level: Minimal Assistance - Patient > 75%    Bathing   Body parts bathed by patient: Right arm;Left arm;Chest;Abdomen;Front perineal area;Right upper leg;Left upper leg;Face Body parts bathed by helper: Right lower leg;Buttocks;Left lower leg   Assist Level: Moderate Assistance - Patient 50 - 74%    Upper Body Dressing(including orthotics)   What is  the patient wearing?: Pull over shirt   Assist Level: Minimal Assistance - Patient > 75%    Lower Body Dressing (excluding footwear)   What is the patient wearing?: Pants Assist for lower body dressing: Maximal Assistance - Patient 25 - 49%    Putting on/Taking off footwear   What is the patient wearing?: [place] for footwear: Total Assistance - Patient < 25%       Care Tool Toileting Toileting activity Toileting Activity did not occur (Clothing management and hygiene only): Refused       Care Tool Bed Mobility Roll left and right activity   Roll left and right assist level:  Contact Guard/Touching assist    Sit to lying activity   Sit to lying assist level: Contact Guard/Touching assist    Lying to sitting on side of bed activity   Lying to sitting on side of bed assist level: the ability to move from lying on the back to sitting on the side of the bed with no back support.: Contact Guard/Touching assist     Care Tool Transfers Sit to stand transfer   Sit to stand assist level: Minimal Assistance - Patient > 75%    Chair/bed transfer   Chair/bed transfer assist level: Minimal Assistance - Patient > 75%     Toilet transfer Toilet transfer activity did not occur: Refused Assist Level: Moderate Assistance - Patient 50 - 74%     Care Tool Cognition  Expression of Ideas and Wants Expression of Ideas and Wants: 3. Some difficulty - exhibits some difficulty with expressing needs and ideas (e.g, some words or finishing thoughts) or speech is not clear  Understanding Verbal and Non-Verbal Content Understanding Verbal and Non-Verbal Content: 3. Usually understands - understands most conversations, but misses some part/intent of message. Requires cues at times to understand   Memory/Recall Ability Memory/Recall Ability : Current season;That he or she is in a hospital/hospital unit   Refer to Care Plan for [place] 1 OT Short Term Goal 1 (Week 1): Pt will attend to L visual field/side of body during functional activities with min A OT Short Term Goal 2 (Week 1): Pt will tolerate standing using LRAD during functional activties/BADLs for 3 minutes OT Short Term Goal 3 (Week 1): Pt will complete toilet transfer CGA using LRAD OT Short Term Goal 4 (Week 1): Pt will complete LB dressing min A  Recommendations for other services: None    Skilled Therapeutic Intervention Therapeutic Intervention Updates/Progress:  Pt received resting in bed eating her breakfast with DTR present in room. Pt in good spirits and receptive to skilled OT  session. OT evaluation and intervention initiated with education provided on OT role, goals, and POC. Pt reporting pain in her bottom at beginning of session- positioned in bed with pillow under hip at end of session to decrease pain. Pt noted to be pocketin food in L cheek with anterior spillage reuiring maximal verbal cues to laterally move tongue to remove food from cheek with +time. Pt brushed her teeth sitting up in bed with set-up assist and mod cueing to spit and clean out her mouth from left over food.  Pt completed BADLs seated EOB at levels listed below during session d/t dizziness and nausea symptoms. Pt requesting not to stand up during session and frequently returning to laying supine in bed to decrease dizziness symptoms during session today. Pt completed UB bathing min A, LB bathing mod A, UB dressing min  A, and LB dressing max A. Pt weaved feet into pants crossing legs into figure 4 position. Pt attempted sit>stand mod A using RW to pull-up pants but was unable to tolerate standing d/t dizziness. Pt returned to bed and pulled pants up bed level max A while bridging hips. Pt was left resting in bed with DTR present in room, call bell in reach, bed alarm on, and all needs met.   ADL ADL Eating: Supervision/safety Where Assessed-Eating: Bed level Grooming: Setup Where Assessed-Grooming: Edge of bed Upper Body Bathing: Minimal assistance Where Assessed-Upper Body Bathing: Edge of bed;Bed level Lower Body Bathing: Moderate assistance Where Assessed-Lower Body Bathing: Edge of bed;Bed level Upper Body Dressing: Minimal assistance Where Assessed-Upper Body Dressing: Edge of bed Lower Body Dressing: Maximal assistance Where Assessed-Lower Body Dressing: Edge of bed;Bed level Toileting: Not assessed (Pt refusing to use bathroom during session) Toilet Transfer: Not assessed Tub/Shower Transfer: Not assessed Social research officer, government: Not assessed Mobility  Bed Mobility Bed Mobility: Supine  to Sit;Sit to Supine Supine to Sit: Contact Guard/Touching assist Sit to Supine: Contact Guard/Touching assist Transfers Sit to Stand: Minimal Assistance - Patient > 75% Stand to Sit: Minimal Assistance - Patient > 75%   Discharge Criteria: Patient will be discharged from OT if patient refuses treatment 3 consecutive times without medical reason, if treatment goals not met, if there is a change in medical status, if patient makes no progress towards goals or if patient is discharged from hospital.  The above assessment, treatment plan, treatment alternatives and goals were discussed and mutually agreed upon: by patient  Janey Genta 11/09/2022, 4:24 PM

## 2022-11-09 NOTE — Progress Notes (Signed)
Inpatient Rehabilitation  Patient information reviewed and entered into eRehab system by Deron Poole Jareth Pardee, OTR/L, Rehab Quality Coordinator.   Information including medical coding, functional ability and quality indicators will be reviewed and updated through discharge.   

## 2022-11-09 NOTE — Progress Notes (Signed)
Inpatient Rehabilitation  Patient information reviewed and entered into eRehab system by Ann Groeneveld M. Reshawn Ostlund, M.A., CCC/SLP, PPS Coordinator.  Information including medical coding, functional ability and quality indicators will be reviewed and updated through discharge.    

## 2022-11-09 NOTE — Plan of Care (Signed)
  Problem: RH Balance Goal: LTG: Patient will maintain dynamic sitting balance (OT) Description: LTG:  Patient will maintain dynamic sitting balance with assistance during activities of daily living (OT) Flowsheets (Taken 11/09/2022 1248) LTG: Pt will maintain dynamic sitting balance during ADLs with: Independent   Problem: Sit to Stand Goal: LTG:  Patient will perform sit to stand in prep for activites of daily living with assistance level (OT) Description: LTG:  Patient will perform sit to stand in prep for activites of daily living with assistance level (OT) Flowsheets (Taken 11/09/2022 1248) LTG: PT will perform sit to stand in prep for activites of daily living with assistance level: Supervision/Verbal cueing   Problem: RH Dressing Goal: LTG Patient will perform lower body dressing w/assist (OT) Description: LTG: Patient will perform lower body dressing with assist, with/without cues in positioning using equipment (OT) Flowsheets (Taken 11/09/2022 1248) LTG: Pt will perform lower body dressing with assistance level of: Supervision/Verbal cueing   Problem: RH Toileting Goal: LTG Patient will perform toileting task (3/3 steps) with assistance level (OT) Description: LTG: Patient will perform toileting task (3/3 steps) with assistance level (OT)  Flowsheets (Taken 11/09/2022 1248) LTG: Pt will perform toileting task (3/3 steps) with assistance level: Supervision/Verbal cueing   Problem: RH Vision Goal: RH LTG Vision Theme park manager) Flowsheets (Taken 11/09/2022 1248) LTG: Vision Goals: Pt will visually scan from R to L visual field to locate items for ADLs without dizziness 3/5 attempts.   Problem: RH Simple Meal Prep Goal: LTG Patient will perform simple meal prep w/assist (OT) Description: LTG: Patient will perform simple meal prep with assistance, with/without cues (OT). Flowsheets (Taken 11/09/2022 1248) LTG: Pt will perform simple meal prep with assistance level of: Supervision/Verbal  cueing   Problem: RH Laundry Goal: LTG Patient will perform laundry w/assist, cues (OT) Description: LTG: Patient will perform laundry with assistance, with/without cues (OT). Flowsheets (Taken 11/09/2022 1248) LTG: Pt will perform laundry with assistance level of: Minimal Assistance - Patient > 75% LTG: Pt will perform laundry with level of: Ambulate with device   Problem: RH Toilet Transfers Goal: LTG Patient will perform toilet transfers w/assist (OT) Description: LTG: Patient will perform toilet transfers with assist, with/without cues using equipment (OT) Flowsheets (Taken 11/09/2022 1248) LTG: Pt will perform toilet transfers with assistance level of: Supervision/Verbal cueing   Problem: RH Tub/Shower Transfers Goal: LTG Patient will perform tub/shower transfers w/assist (OT) Description: LTG: Patient will perform tub/shower transfers with assist, with/without cues using equipment (OT) Flowsheets (Taken 11/09/2022 1248) LTG: Pt will perform tub/shower stall transfers with assistance level of: Supervision/Verbal cueing

## 2022-11-09 NOTE — Evaluation (Signed)
Physical Therapy Assessment and Plan  Patient Details  Name: Leslie [name] MRN: [id number] Date of Birth: Mar 14, 1941  PT Diagnosis: BPPV, Dizziness and giddiness, Hemiparesis non-dominant, Impaired cognition, Impaired sensation, and Muscle weakness Rehab Potential: Good ELOS: 12-14 days   Today's Date: 11/09/2022 PT Individual Time: 0354-6568 PT Individual Time Calculation (min): 74 min    Hospital Problem: Principal Problem:   Middle cerebral artery embolism, right   Past Medical History:  Past Medical History:  Diagnosis Date   Arthritis    Back pain due to injury    fell off a ladder and fractured back-T12 down (multiple surgeries w/ onging pain)   Chronic kidney disease    "stage 3 kidney failure" improving per pt   Headache    Hypothyroidism    Insomnia    Plantar fasciitis    Stomach ulcer ~2000   Stroke ([place])    TIA X2   2001, jan 2017   Vertigo, benign paroxysmal, bilateral    Vitamin D deficiency    Past Surgical History:  Past Surgical History:  Procedure Laterality Date   ABDOMINAL HYSTERECTOMY  1974   ANKLE FUSION Right 06/07/2016   Procedure: RIGHT TALONAVICULAR ARTHRODESIS ;  Surgeon: Wylene Simmer, MD;  Location: [facility];  Service: Orthopedics;  Laterality: Right;   APPENDECTOMY     BACK SURGERY     x4  fusion T12 thru sacrum; U rod in sacrum   CATARACT EXTRACTION, BILATERAL Bilateral 2015   GASTROCNEMIUS RECESSION Right 06/07/2016   Procedure: RIGHT GASTROC RECESSION ;  Surgeon: Wylene Simmer, MD;  Location: [facility];  Service: Orthopedics;  Laterality: Right;   HAND SURGERY Left 2003   x2   IR ANGIO EXTRACRAN SEL COM CAROTID INNOMINATE UNI L MOD SED  11/03/2022   IR CT HEAD LTD  11/03/2022   IR PERCUTANEOUS ART THROMBECTOMY/INFUSION INTRACRANIAL INC DIAG ANGIO  11/03/2022   L heel surgery for torn tendon Left 2015   PLANTAR FASCIA SURGERY Right 2009   surgery at [facility] 11/03/2022    Procedure: IR WITH ANESTHESIA;  Surgeon: Radiologist, Medication, MD;  Location: [facility];  Service: Radiology;  Laterality: N/A;   SHOULDER ARTHROSCOPY Right 2007   TENDON REPAIR Right 1949   foot- lacerated tendon in arch of foot   THORACENTESIS N/A 10/30/2022   Procedure: THORACENTESIS;  Surgeon: Julian Hy, DO;  Location: [facility];  Service: Cardiopulmonary;  Laterality: N/A;   TOTAL HIP ARTHROPLASTY Left 10/05/2021   Procedure: TOTAL HIP ARTHROPLASTY ANTERIOR APPROACH;  Surgeon: Rod Can, MD;  Location: [facility];  Service: Orthopedics;  Laterality: Left;    Assessment & Plan Clinical Impression: Patient is a 81 y.o. RH-female with history of prior CVA, CKD, GERD recent bouts with cough treated with atbx/steroids who was admitted with reports of 2-3 weeks of palpation, seen at PCP office for lightheadedness w/dizziness found to have A fib with RVR and sent to [facility] on 10/29/22 for evaluation.  She was started on IV Cardizem and Heparin. CT chest done due to episode of CP and reports of cough and revealed right layering pleural effusion and RUL PNA. She was started on Ceftriaxone and Zithromax for CAP and underwent thoracocentesis of 500 cc parapneumonic effusion by PCCM. Dr. Martinique consulted for management of A Fib and fel that new onset likely due to PNA.  2D echo showed EF 40-45% with LV global hypokinesis with moderate asymmetric left LVH, moderate to severe MVR and  dilated inferior vena cava.  She was started on metoprolol but reported SOB side effects therefore this was d/c, Cardizem titrated upwards, transitioned to Eliquis and plans for outpatient cardioversion in 3 week.    On 11/25, patient had sudden onset of right gaze deviation with left hemiparesis and left neglect. 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  Dr. Erlinda Hong felt that stroke was embolic due to A fib w/RVR  even on Eliquis. Follow up MRI brain done revealing small acute infarct in right frontal, temporal lobe and caudate body with advanced chronic small vessel disease. Dr. Curt Bears following for ongoing issues with A flutter which converted with IV amiodarone 11/25 and Cardizem titrated up to 360 mg but heart rate continues to run up to 120's at rest. MBS done revealing moderate spillage with buccal residue and delay in swallow with occasional trace aspiration. She was started on D3, nectar liquids but reports has been drinking "good" (unthickened) water to clear residue. Left sided weakness improving with increase in left attention but she continues to be limited by cognitive impairments, left sensory deficits, balance deficits and dysarthria. CIR recommended due to functional decline. Patient transferred to CIR on 11/08/2022 .   Patient currently requires min assist with mobility secondary to muscle weakness, decreased cardiorespiratoy endurance, impaired timing and sequencing, unbalanced muscle activation, and decreased coordination, ,, decreased attention to left and decreased motor planning, decreased initiation, decreased attention, decreased awareness, decreased problem solving, decreased safety awareness, and delayed processing, symptomatic for BPPV, and decreased standing balance and decreased balance strategies.  Prior to hospitalization, patient was modified independent  with mobility and lived with Alone, Other (Comment) in a House home.  Home access is 2Stairs to enter.  Patient will benefit from skilled PT intervention to maximize safe functional mobility, minimize fall risk, and decrease caregiver burden for planned discharge home with 24 hour supervision.  Anticipate patient will benefit from follow up OP at discharge.  PT - End of Session Activity Tolerance: Tolerates 30+ min activity with multiple rests PT Assessment Rehab Potential (ACUTE/IP ONLY): Good PT Barriers to Discharge: [place]  home environment;Decreased caregiver support;Home environment access/layout;Lack of/limited family support;Insurance for SNF coverage;Nutrition means PT Patient demonstrates impairments in the following area(s): Balance;Endurance;Motor;Nutrition;Pain;Perception;Safety;Sensory;Skin Integrity PT Transfers Functional Problem(s): Bed Mobility;Bed to Chair;Car;Furniture PT Locomotion Functional Problem(s): Ambulation;Wheelchair Mobility;Stairs PT Plan PT Intensity: Minimum of 1-2 x/day ,45 to 90 minutes PT Frequency: 5 out of 7 days PT Duration Estimated Length of Stay: 12-14 days PT Treatment/Interventions: Ambulation/gait training;Community reintegration;DME/adaptive equipment instruction;Neuromuscular re-education;Psychosocial support;Stair training;UE/LE Strength taining/ROM;Wheelchair propulsion/positioning;UE/LE Coordination activities;Therapeutic Activities;Skin care/wound management;Pain management;Discharge planning;Balance/vestibular training;Cognitive remediation/compensation;Disease management/prevention;Functional mobility training;Patient/family education;Splinting/orthotics;Therapeutic Exercise;Visual/perceptual remediation/compensation PT Transfers Anticipated Outcome(s): supervision PT Locomotion Anticipated Outcome(s): supervision once BPPV resolved PT Recommendation Recommendations for Other Services: Vestibular eval;Therapeutic Recreation consult Therapeutic Recreation Interventions: Pet therapy;Kitchen group;Stress management Follow Up Recommendations: Home health PT;Outpatient PT;24 hour supervision/assistance Patient destination: Home Equipment Recommended: To be determined   PT Evaluation Precautions/Restrictions Precautions Precautions: Fall Precaution Comments: dys3, nectar, BPPV, L inattention Restrictions Weight Bearing Restrictions: No General   Vital Signs  Pain Pain Assessment Pain Scale: 0-10 Pain Score: 0-No pain Pain Interference Pain Interference Pain  Effect on Sleep: 1. Rarely or not at all Pain Interference with Therapy Activities: 1. Rarely or not at all Pain Interference with Day-to-Day Activities: 1. Rarely or not at all Home Living/Prior [place] Available Help at Discharge: Family;Neighbor;Available 24 hours/day Type  of Home: House Home Access: Stairs to enter [employer] of Steps: 2 Entrance Stairs-Rails: Right;Left Home Layout: Two level;Able to live on main level with bedroom/bathroom Alternate Level Stairs-Number of Steps: 4 steps down to main living room w/ rail Bathroom Shower/Tub: Multimedia programmer: Standard Bathroom Accessibility: Yes Additional Comments: Daughter to arrange 24/7 supervision; she is here with her spouse from [place] With: Alone;Other (Comment) Prior Function Level of Independence: Independent with homemaking with ambulation;Requires assistive device for independence;Independent with gait;Independent with basic ADLs  Able to Take Stairs?: Yes Driving: Yes Vocation: Retired Vision/Perception  Vision - History Ability to See in Adequate Light: 1 Impaired Vision - Assessment Alignment/Gaze Preference: Gaze right Tracking/Visual Pursuits: Right eye does not track laterally;Left eye does not track laterally;Decreased smoothness of horizontal tracking;Requires cues, head turns, or add eye shifts to track Saccades: Additional eye shifts occurred during testing;Undershoots Convergence: Impaired - to be further tested in functional context Additional Comments: L innation, some dizziness; challenges following directions for visual assessment Perception Perception: Impaired Inattention/Neglect: Does not attend to left side of body;Does not attend to left visual field Praxis Praxis: Impaired Praxis-Other Comments: Difficult to assess during evaluation d/t Pt nausea symptoms; to be furhter assessed in functional context  Cognition Overall Cognitive Status:  Impaired/Different from baseline Arousal/Alertness: Awake/alert Orientation Level: Oriented to person;Oriented to place;Oriented to time;Oriented to situation Attention: Sustained Focused Attention: Appears intact Sustained Attention: Impaired Sustained Attention Impairment: Verbal complex;Functional complex Selective Attention: Impaired Selective Attention Impairment: Verbal complex;Functional complex Memory: Impaired Memory Impairment: Storage deficit;Retrieval deficit;Decreased recall of new information Awareness: Impaired Awareness Impairment: Intellectual impairment Problem Solving: Impaired Problem Solving Impairment: Functional basic;Verbal basic Executive Function: Initiating Sequencing: Impaired Sequencing Impairment: Verbal basic;Functional basic Organizing: Impaired Organizing Impairment: Verbal basic;Functional basic Initiating: Impaired Self Monitoring Impairment: Verbal basic;Functional basic Self Correcting: Impaired Self Correcting Impairment: Verbal basic;Functional basic Safety/Judgment: Impaired Sensation Sensation Light Touch: Impaired by gross assessment Proprioception: Impaired by gross assessment Additional Comments: diminished LT in LLE compared to RLE; unable to discern pressure to individual toes on L foot when hidden Coordination Gross Motor Movements are Fluid and Coordinated: No Fine Motor Movements are Fluid and Coordinated: No Heel Shin Test: very slow to initiate L>R, performed in supine Motor  Motor Motor: Other (comment) Motor - Skilled Clinical Observations: L hemipareisis, slow to initiate with L, minimal coordination deficits.   Trunk/Postural Assessment  Cervical Assessment Cervical Assessment: Exceptions to [facility] (forward head) Thoracic Assessment Thoracic Assessment: Exceptions to [facility] (rounding of shoulders) Lumbar Assessment Lumbar Assessment: Within Functional Limits Postural Control Postural Control: Within Functional Limits   Balance Balance Balance Assessed: Yes Static Sitting Balance Static Sitting - Balance Support: Feet supported;No upper extremity supported Static Sitting - Level of Assistance: 5: Stand by assistance Dynamic Sitting Balance Dynamic Sitting - Balance Support: Feet supported;Right upper extremity supported Dynamic Sitting - Balance Activities: Reaching for objects;Lateral lean/weight shifting;Reaching across midline;Forward lean/weight shifting Sitting balance - Comments: maintains in reaching for objects, adjusting positioning on EOB and in performing squat pivot transfer following instructions for hand/ foot placement Static Standing Balance Static Standing - Balance Support: Bilateral upper extremity supported Static Standing - Level of Assistance: 4: Min assist (unable to maintain stance for >3sec d/t increased dizziness and nausea from BPPV) Extremity Assessment      RLE Assessment RLE Assessment: Within Functional Limits General Strength Comments: grossly 4 to 4+/5 prox to distal LLE Assessment LLE Assessment: Exceptions to [facility] General Strength Comments: grossly 4-/ 5 throughout  Care Tool  Care Tool Bed Mobility Roll left and right activity   Roll left and right assist level: Contact Guard/Touching assist    Sit to lying activity   Sit to lying assist level: Contact Guard/Touching assist    Lying to sitting on side of bed activity   Lying to sitting on side of bed assist level: the ability to move from lying on the back to sitting on the side of the bed with no back support.: Contact Guard/Touching assist     Care Tool Transfers Sit to stand transfer   Sit to stand assist level: Minimal Assistance - Patient > 75%    Chair/bed transfer   Chair/bed transfer assist level: Minimal Assistance - Patient > 75%     Toilet transfer Toilet transfer activity did not occur: Refused Assist Level: Moderate Assistance - Patient 50 - 74%    Car transfer Car transfer activity did  not occur: Environmental limitations        Care Tool Locomotion Ambulation Ambulation activity did not occur: Safety/medical concerns (unable d/t increased dizziness and nausea from BPPV)        Walk 10 feet activity Walk 10 feet activity did not occur: Safety/medical concerns       Walk 50 feet with 2 turns activity Walk 50 feet with 2 turns activity did not occur: Safety/medical concerns      Walk 150 feet activity Walk 150 feet activity did not occur: Safety/medical concerns      Walk 10 feet on uneven surfaces activity Walk 10 feet on uneven surfaces activity did not occur: Safety/medical concerns      Stairs Stair activity did not occur: Safety/medical concerns (unable d/t increased dizziness and nausea from BPPV)        Walk up/down 1 step activity Walk up/down 1 step or curb (drop down) activity did not occur: Safety/medical concerns      Walk up/down 4 steps activity Walk up/down 4 steps activity did not occur: Safety/medical concerns      Walk up/down 12 steps activity Walk up/down 12 steps activity did not occur: Safety/medical concerns      Pick up small objects from floor Pick up small object from the floor (from standing position) activity did not occur: Safety/medical concerns      Wheelchair Is the patient using a wheelchair?: Yes Type of Wheelchair: Manual Wheelchair activity did not occur: Safety/medical concerns (unable d/t increased dizziness and nausea from BPPV)      Wheel 50 feet with 2 turns activity Wheelchair 50 feet with 2 turns activity did not occur: Safety/medical concerns    Wheel 150 feet activity Wheelchair 150 feet activity did not occur: Safety/medical concerns      Refer to Care Plan for Long Term Goals  SHORT TERM GOAL WEEK 1 PT Short Term Goal 1 (Week 1): Pt will initiate BPPV retraining. PT Short Term Goal 2 (Week 1): Pt will perform bed mobility with overall supervision. PT Short Term Goal 3 (Week 1): Pt will initiate  standing transfers with overall CGA and reduced dizziness and nausea. PT Short Term Goal 4 (Week 1): Pt will initiate gait training. PT Short Term Goal 5 (Week 1): Pt will initiate stair training.  Recommendations for other services: Therapeutic Recreation  Pet therapy, Kitchen group, and Stress management and Other: vestibular evaluation  Skilled Therapeutic Intervention Mobility Bed Mobility Bed Mobility: Supine to Sit;Sit to Supine Supine to Sit: Contact Guard/Touching assist Sit to Supine: Contact Guard/Touching assist Transfers Transfers: Sit to Stand;Stand  to Sit;Squat Pivot Transfers Sit to Stand: Minimal Assistance - Patient > 75% Stand to Sit: Minimal Assistance - Patient > 75% (initiates without warning d/t increased symptoms from BPPV) Squat Pivot Transfers: Minimal Assistance - Patient > 75% Transfer (Assistive device): Rolling walker Locomotion  Gait Ambulation: No Gait Gait: No Stairs / Additional Locomotion Stairs: No Wheelchair Mobility Wheelchair Mobility: No  Skilled Intervention: PT Evaluation completed; see above for results. PT educated patient in roles of PT vs OT, PT POC, rehab potential, rehab goals, and discharge recommendations along with recommendation for follow-up rehabilitation services. Individual treatment initiated:  Patient supine in bed with HOB elevated upon PT arrival. Dtr in room. Patient alert and agreeable to PT session. No pain complaint during session. But does c/o severe nausea and dizziness with standing. Pt has had BPPV in past and responded well to treatment. Two nursing students in room and relate that pt's BP is very low with reading in 50's/ 40's.   Therapeutic Activity: Bed Mobility: Patient performed supine --> sit x2 with use of bedrails and extra time to reach seated position on EOB with feet on floor. Overall CGA provided with vc/ tc for technique. Slow to initiate. Allowed to sit for extended time in order to allow time for BPPV  accomodation to upright seated position. After time in seated position with no symptoms, BP taken and reading at 97/71 (79), with pulse at 62.   Returns to supine with encouragement and requires time for slow processing and demos delay in initiation.   Transfers: Patient performed sit <> stand at start of session with minA to RW. Provided vc/tc for technique in push to stand from bed surface. Pt immediately dizzy and moves to sit within 3sec of reaching standing. MinA to control descent. Requires immediate return to supine.   Appropriate sized w/c acquired for pt to improve mobility within unit. After acquiring w/c, pt guided in squat pivot toward L side bed --> w/c. Pt able to reach L hand to w/c  and hold armrest. MinA to complete to seat. Requires 2 attempts to reach center of seat. Pt believes that she can perform this with staff and potentially to reach [facility] for toileting. Updated safety plan and informed NT. Squat pivot to R from w/c to bed with MinA. Requires time to accommodate prior to return to supine.   Neuromuscular Re-ed: NMR facilitated during session with focus on sitting balance. Pt guided in reaching for objects within and outside of BOS requiring repositioning of BLE. Supervision provided for good balance. Unable to fully attempt standing balance challenge d/t vertigo symptoms on rising to stance. No LOB during brief stance. NMR performed for improvements in motor control and coordination, balance, sequencing, judgement, and self confidence/ efficacy in performing all aspects of mobility at highest level of independence.   Patient supine  in bed at end of session with brakes locked, bed alarm set, and all needs within reach. NT notified of pt's desire to attempt to eat. NT has provided pt with thickened coffee. Is to return to provide supervision for meal.   Discharge Criteria: Patient will be discharged from PT if patient refuses treatment 3 consecutive times without medical reason, if  treatment goals not met, if there is a change in medical status, if patient makes no progress towards goals or if patient is discharged from hospital.  The above assessment, treatment plan, treatment alternatives and goals were discussed and mutually agreed upon: by patient and by family  Alger Simons  PT, DPT, CSRS 11/09/2022, 4:16 PM

## 2022-11-09 NOTE — H&P (Signed)
Physical Medicine and Rehabilitation Admission H&P        Chief Complaint  Patient presents with   Functional deficits due to stroke      HPI: Leslie [name] is an 81 year old RH-female with history of prior CVA, CKD, GERD recent bouts with cough treated with atbx/steroids who was admitted with reports of 2-3 weeks of palpation, seen at PCP office for lightheadedness w/dizziness found to have A fib with RVR and sent to [facility] on 10/29/22 for evaluation.  She was started on IV Cardizem and Heparin. CT chest done due to episode of CP and reports of cough and revealed right layering pleural effusion and RUL PNA. She was started on Ceftriaxone and Zithromax for CAP and underwent thoracocentesis of 500 cc parapneumonic effusion by PCCM. Dr. Martinique consulted for management of A Fib and fel that new onset likely due to PNA.  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  She was started on metoprolol but reported SOB side effects therefore this was d/c, Cardizem titrated upwards, transitioned to Eliquis and plans for outpatient cardioversion in 3 week.    On 11/25, patient had sudden onset of right gaze deviation with left hemiparesis and left neglect. 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  Dr. Erlinda Hong felt that stroke was embolic due to A fib w/RVR even on Eliquis. Follow up MRI brain done revealing small acute infarct in right frontal, temporal lobe and caudate body with advanced chronic small vessel disease. Dr. Curt Bears following for ongoing issues with A flutter which converted with IV amiodarone 11/25 and Cardizem titrated up to 360 mg but heart rate continues to run up to 120's at rest. MBS done revealing moderate spillage with buccal residue and delay in swallow with occasional trace aspiration.  She was started on D3, nectar liquids but reports has been drinking "good" (unthickened) water to clear residue. Left sided weakness improving with increase in left attention but she continues to be limited by cognitive impairments, left sensory deficits, balance deficits and dysarthria. CIR recommended due to functional decline.      Review of Systems  Constitutional:  Negative for chills and fever.  HENT:  Negative for hearing loss and tinnitus.   Respiratory:  Negative for shortness of breath.   Cardiovascular:  Negative for chest pain and palpitations.  Gastrointestinal:  Positive for constipation. Negative for heartburn and nausea.  Genitourinary:  Negative for dysuria.  Musculoskeletal:  Positive for back pain and joint pain (right hip pain).  Neurological:  Positive for dizziness (looking up/vertigo and nausea) and sensory change.          Past Medical History:  Diagnosis Date   Arthritis     Back pain due to injury      fell off a ladder and fractured back-T12 down (multiple surgeries w/ onging pain)   Chronic kidney disease      "stage 3 kidney failure" improving per pt   Headache     Hypothyroidism     Insomnia     Plantar fasciitis     Stomach ulcer ~2000   Stroke [facility])      TIA X2   2001, jan 2017   Vitamin D deficiency             Past Surgical History:  Procedure Laterality Date  ABDOMINAL HYSTERECTOMY   1974   ANKLE FUSION Right 06/07/2016    Procedure: RIGHT TALONAVICULAR ARTHRODESIS ;  Surgeon: Wylene Simmer, MD;  Location: [place];  Service: Orthopedics;  Laterality: Right;   APPENDECTOMY       BACK SURGERY        x4  fusion T12 thru sacrum; U rod in sacrum   CATARACT EXTRACTION, BILATERAL Bilateral 2015   GASTROCNEMIUS RECESSION Right 06/07/2016    Procedure: RIGHT GASTROC RECESSION ;  Surgeon: Wylene Simmer, MD;  Location: [place];  Service: Orthopedics;  Laterality: Right;   HAND SURGERY Left 2003    x2   IR ANGIO  EXTRACRAN SEL COM CAROTID INNOMINATE UNI L MOD SED   11/03/2022   IR CT HEAD LTD   11/03/2022   IR PERCUTANEOUS ART THROMBECTOMY/INFUSION INTRACRANIAL INC DIAG ANGIO   11/03/2022   L heel surgery for torn tendon Left 2015   PLANTAR FASCIA SURGERY Right 2009    surgery at [place] 11/03/2022    Procedure: IR WITH ANESTHESIA;  Surgeon: Radiologist, Medication, MD;  Location: [place];  Service: Radiology;  Laterality: N/A;   SHOULDER ARTHROSCOPY Right 2007   TENDON REPAIR Right 1949    foot- lacerated tendon in arch of foot   THORACENTESIS N/A 10/30/2022    Procedure: THORACENTESIS;  Surgeon: Julian Hy, DO;  Location: [place];  Service: Cardiopulmonary;  Laterality: N/A;   TOTAL HIP ARTHROPLASTY Left 10/05/2021    Procedure: TOTAL HIP ARTHROPLASTY ANTERIOR APPROACH;  Surgeon: Rod Can, MD;  Location: [facility];  Service: Orthopedics;  Laterality: Left;           Family History  Problem Relation Age of Onset   Hypertension Mother     Lung cancer Mother     Lung cancer Father     Diabetes Sister     Hypertension Sister     Congestive Heart Failure Sister     Arthritis Brother        Social History: Lives alone, Was an Medical illustrator. She  reports that she quit smoking about 36 years ago. Her smoking use included cigarettes--she smoked about a year. She has never used smokeless tobacco. She reports that she does not drink alcohol and does not use drugs.          Allergies  Allergen Reactions   Duloxetine Nausea Only   Voltaren [Diclofenac Sodium] Shortness Of Breath and Palpitations   Lisinopril Other (See Comments)   Aspirin Other (See Comments)      ulcers   Diphenhydramine Other (See Comments)   Gabapentin Itching   Ultram [Tramadol Hcl] Other (See Comments)      Effects cognitive abilities    Proton Pump Inhibitors Rash      Itching, rash            Medications Prior to Admission  Medication Sig Dispense Refill   acetaminophen  (TYLENOL) 325 MG tablet Take 1-2 tablets (325-650 mg total) by mouth every 6 (six) hours as needed for mild pain (pain score 1-3 or temp > 100.5). (Patient taking differently: Take 500 mg by mouth every 6 (six) hours as needed for mild pain (pain score 1-3 or temp > 100.5).)       Cholecalciferol (VITAMIN D) 50 MCG (2000 UT) tablet Take 2,000 Units by mouth daily.       COLLAGEN PO Take 12 g by mouth daily.  cyclobenzaprine (FLEXERIL) 10 MG tablet Take 10 mg by mouth 2 (two) times daily as needed for muscle spasms.       famotidine (PEPCID) 20 MG tablet Take 20 mg by mouth daily as needed for indigestion or heartburn.       Humidifier/Vaporizer Supplies (VICKS VAPORIZER SCENT) PADS Inhale 1 Dose into the lungs daily as needed (congestion).       levothyroxine (SYNTHROID) 88 MCG tablet Take 88 mcg by mouth daily before breakfast.       Polyethyl Glycol-Propyl Glycol (SYSTANE OP) Place 1 drop into both eyes daily as needed (dry eyes).       senna (SENOKOT) 8.6 MG TABS tablet Take 1 tablet (8.6 mg total) by mouth 2 (two) times daily. (Patient taking differently: Take 1 tablet by mouth 2 (two) times daily as needed for mild constipation or moderate constipation.) 120 tablet 0          Home: Home Living Family/patient expects to be discharged to:: Private residence Living Arrangements: Alone Available Help at Discharge: Family, Industrial/product designer, Available 24 hours/day (DTr from [place], [place] and neighbor to arrange 24/7 supervision after CIR) Type of Home: House Home Access: Stairs to enter [employer] of Steps: 2 Entrance Stairs-Rails: Right, Left Home Layout: Two level, Able to live on main level with bedroom/bathroom Alternate Level Stairs-Number of Steps: 4 steps down to main living room w/ rail Bathroom Shower/Tub: Multimedia programmer: Standard Bathroom Accessibility: Yes Home Equipment: Conservation officer, nature (2 wheels), [employer] - single point, Electronics engineer Comments:  Daughter to arrange 24/7 supervision; she is here with her spouse from [place] With: Alone   Functional History: Prior Function Prior Level of Function : Independent/Modified Independent, Driving Mobility Comments: Mod indep with intermittent use of SPC ADLs Comments: stands in walk-in shower   Functional Status:  Mobility: Bed Mobility Overal bed mobility: Needs Assistance Bed Mobility: Supine to Sit Supine to sit: HOB elevated, Min guard General bed mobility comments: min guard for pulling to EoB, heavy use of bedrail to pull to EoB Transfers Overall transfer level: Needs assistance Equipment used: Rolling walker (2 wheels) Transfers: Sit to/from Stand Sit to Stand: Min guard General transfer comment: increased time and effort, pt reports nausea in standing and sits back down Ambulation/Gait Ambulation/Gait assistance: Min assist Gait Distance (Feet): 150 Feet Assistive device: Rolling walker (2 wheels) Gait Pattern/deviations: Step-through pattern, Decreased step length - right, Decreased stride length, Trunk flexed, Drifts right/left General Gait Details: deferred due to drop in BP and increase in HR Gait velocity: decreased Gait velocity interpretation: <1.31 ft/sec, indicative of household ambulator   ADL: ADL Overall ADL's : Needs assistance/impaired Eating/Feeding: Set up, Sitting Eating/Feeding Details (indicate cue type and reason): at the end of the session Grooming: Min guard, Standing Grooming Details (indicate cue type and reason): cues for sequencing Upper Body Bathing: Minimal assistance, Sitting Lower Body Bathing: Minimal assistance, Sit to/from stand Upper Body Dressing : Minimal assistance, Sitting Lower Body Dressing: Minimal assistance, Sit to/from stand Toilet Transfer: Minimal assistance, Ambulation, Rolling walker (2 wheels), BSC/3in1 Toileting- Clothing Manipulation and Hygiene: Minimal assistance, Sitting/lateral lean Functional mobility  during ADLs: Minimal assistance, Rolling walker (2 wheels) General ADL Comments: cues and increased time needed   Cognition: Cognition Overall Cognitive Status: Impaired/Different from baseline Arousal/Alertness: Awake/alert Orientation Level: Oriented X4 Attention: Focused, Sustained, Selective Focused Attention: Appears intact Sustained Attention: Impaired Sustained Attention Impairment: Verbal complex, Functional complex Memory: Impaired Awareness: Impaired Awareness Impairment: Intellectual impairment, Emergent impairment Problem  Solving: Impaired Problem Solving Impairment: Functional basic Executive Function: Self Monitoring Self Monitoring: Impaired Self Monitoring Impairment: Verbal basic, Functional basic Cognition Arousal/Alertness: Awake/alert Behavior During Therapy: WFL for tasks assessed/performed Overall Cognitive Status: Impaired/Different from baseline Area of Impairment: Attention, Awareness, Problem solving Current Attention Level: Selective Awareness: Emergent Problem Solving: Slow processing, Requires verbal cues General Comments: Pt in bed talking to phone that shows daughter's name on the screen however daughter not on the line. pt with decreased awareness of not taking all of her pills and dropping them in the bed.     Blood pressure 116/70, pulse 98, temperature (!) 97.5 F (36.4 C), temperature source Oral, resp. rate (!) 22, height 5' 7.5" (1.715 m), weight 72 kg, SpO2 97 %. Physical Exam Constitutional:      Comments: In bed with large amount of food pocked on the left. Needed multiple cues to clear check and swallow.   Cardiovascular:     Rate and Rhythm: Tachycardia present.     no murmurs , reg rythmn Neurological:     Mental Status: She is alert and oriented to person, place, and time.     Comments: Left facial weakness with mild to moderate dysarthria--in part due to food in her mouth. Needed multiple cues to clear. She was able to follow  basic commands       General: No acute distress Mood and affect are appropriate  Lungs: Clear to auscultation, breathing unlabored, no rales or wheezes Abdomen: Positive bowel sounds, soft nontender to palpation, nondistended Extremities: No clubbing, cyanosis, or edema Skin: No evidence of breakdown, no evidence of rash Neurologic: left neglect with confrontation testing  5/5 RUE and RLE 4/5 LUE, 4/5 LLE except 3- L ankle DF Sensation intact LT LUE, diminished LT LLE Musculoskeletal: Full range of motion in all 4 extremities. No joint swelling    Lab Results Last 48 Hours        Results for orders placed or performed during the hospital encounter of 10/29/22 (from the past 48 hour(s))  CBC     Status: Abnormal    Collection Time: 11/07/22  1:33 AM  Result Value Ref Range    WBC 11.8 (H) 4.0 - 10.5 K/uL    RBC 4.22 3.87 - 5.11 MIL/uL    Hemoglobin 11.7 (L) 12.0 - 15.0 g/dL    HCT 37.2 36.0 - 46.0 %    MCV 88.2 80.0 - 100.0 fL    MCH 27.7 26.0 - 34.0 pg    MCHC 31.5 30.0 - 36.0 g/dL    RDW 15.4 11.5 - 15.5 %    Platelets 287 150 - 400 K/uL    nRBC 0.0 0.0 - 0.2 %      Comment: Performed at [facility], [place] [address], [place], [place] [address]  Basic metabolic panel     Status: Abnormal    Collection Time: 11/07/22  1:33 AM  Result Value Ref Range    Sodium 135 135 - 145 mmol/L    Potassium 3.8 3.5 - 5.1 mmol/L    Chloride 101 98 - 111 mmol/L    CO2 25 22 - 32 mmol/L    Glucose, Bld 118 (H) 70 - 99 mg/dL      Comment: Glucose reference range applies only to samples taken after fasting for at least 8 hours.    BUN 17 8 - 23 mg/dL    Creatinine, Ser 1.12 (H) 0.44 - 1.00 mg/dL    Calcium 8.8 (L) 8.9 -  10.3 mg/dL    GFR, Estimated 50 (L) >60 mL/min      Comment: (NOTE) Calculated using the CKD-EPI Creatinine Equation (2021)      Anion gap 9 5 - 15      Comment: Performed at [facility], [place] [address] [place], [place] [address]  CBC     Status: Abnormal     Collection Time: 11/08/22  1:46 AM  Result Value Ref Range    WBC 14.2 (H) 4.0 - 10.5 K/uL    RBC 4.64 3.87 - 5.11 MIL/uL    Hemoglobin 13.1 12.0 - 15.0 g/dL    HCT 40.8 36.0 - 46.0 %    MCV 87.9 80.0 - 100.0 fL    MCH 28.2 26.0 - 34.0 pg    MCHC 32.1 30.0 - 36.0 g/dL    RDW 15.2 11.5 - 15.5 %    Platelets 312 150 - 400 K/uL    nRBC 0.0 0.0 - 0.2 %      Comment: Performed at [facility], [place] [address], [place], [place] [address]  Basic metabolic panel     Status: Abnormal    Collection Time: 11/08/22  1:46 AM  Result Value Ref Range    Sodium 139 135 - 145 mmol/L    Potassium 3.7 3.5 - 5.1 mmol/L    Chloride 103 98 - 111 mmol/L    CO2 26 22 - 32 mmol/L    Glucose, Bld 105 (H) 70 - 99 mg/dL      Comment: Glucose reference range applies only to samples taken after fasting for at least 8 hours.    BUN 17 8 - 23 mg/dL    Creatinine, Ser 1.13 (H) 0.44 - 1.00 mg/dL    Calcium 9.2 8.9 - 10.3 mg/dL    GFR, Estimated 49 (L) >60 mL/min      Comment: (NOTE) Calculated using the CKD-EPI Creatinine Equation (2021)      Anion gap 10 5 - 15      Comment: Performed at [place] [address], [place],  [address]      Imaging Results (Last 48 hours)  No results found.         Blood pressure 116/70, pulse 98, temperature (!) 97.5 F (36.4 C), temperature source Oral, resp. rate (!) 22, height 5' 7.5" (1.715 m), weight 72 kg, SpO2 97 %.   Medical Problem List and Plan: 1. Functional deficits secondary to Right MCA embolic infarct              -patient may  shower             -ELOS/Goals: 10-14d sup  2.  Antithrombotics: -DVT/anticoagulation:  Pharmaceutical: Eliquis             -antiplatelet therapy: N/A 3. Pain Management: N/A 4. Mood/Behavior/Sleep: LCSW to follow for evaluation and support.              -antipsychotic agents: N/A 5. Neuropsych/cognition: This patient maybe capable of making decisions on her own behalf. 6. Skin/Wound Care: Routine  pressure relief measures.  7. Fluids/Electrolytes/Nutrition: Monitor I/O. Check CMET in am    Latest Ref Rng & Units 11/09/2022    5:55 AM 11/08/2022    1:46 AM 11/07/2022    1:33 AM  BMP  Glucose 70 - 99 mg/dL 118  105  118   BUN 8 - 23 mg/dL '20  17  17   '$ Creatinine 0.44 - 1.00  mg/dL 1.22  1.13  1.12   Sodium 135 - 145 mmol/L 139  139  135   Potassium 3.5 - 5.1 mmol/L 3.5  3.7  3.8   Chloride 98 - 111 mmol/L 99  103  101   CO2 22 - 32 mmol/L '28  26  25   '$ Calcium 8.9 - 10.3 mg/dL 9.8  9.2  8.8   Renal fxn stable   8. MVR/A fib: Monitor HR TID--on Eliquis and Cardizem but heart rate seems to be uncontrolled.             --monitor for symptoms with increase in activity.              --plans for CV in the future.             --heart healthy diet. 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  Some concerns regarding pocketing of food and large pill, SLP eval today , if this issue persists may need to change cardizem '360mg'$ CD to diltiazem 90 mg QID 9. HTN: Monitor BP TID--on Cardizem and Lasix-->recommendations to d/c Lasix and start aldactone in am.              --lasix to be used prn overload.  Vitals:   11/08/22 2005 11/09/22 0416  BP: 117/69 115/82  Pulse: (!) 56 82  Resp: 16 16  Temp: 97.8 F (36.6 C) 97.8 F (36.6 C)  SpO2: 97% 98%    10. Leucocytosis:  Monitor for fevers and other signs of infection.                 Latest Ref Rng & Units 11/09/2022    5:55 AM 11/08/2022    1:46 AM 11/07/2022    1:33 AM  CBC  WBC 4.0 - 10.5 K/uL 14.7  14.2  11.8   Hemoglobin 12.0 - 15.0 g/dL 13.5  13.1  11.7   Hematocrit 36.0 - 46.0 % 42.1  40.8  37.2   Platelets 150 - 400 K/uL 367  312  287     11. Acute renal failure:  SCr 0.98-->1.25-->1.13 12. CAP: Has completed antibiotic regimen 11/05/22.             --monitor for hypoxia/dyspnea-->denies respiratory symptoms.  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 No symptoms?     Bary Leriche, PA-C 11/08/2022  "I have personally  performed a face to face diagnostic evaluation of this patient.  Additionally, I have reviewed and concur with the physician assistant's documentation above." Charlett Blake [name] [facility] Fellow Am Acad of Phys Med and Rehab Diplomate Am Board of Electrodiagnostic Med Fellow Am Board of Interventional Pain

## 2022-11-10 DIAGNOSIS — I482 Chronic atrial fibrillation, unspecified: Secondary | ICD-10-CM | POA: Diagnosis not present

## 2022-11-10 DIAGNOSIS — I10 Essential (primary) hypertension: Secondary | ICD-10-CM | POA: Diagnosis not present

## 2022-11-10 DIAGNOSIS — I69391 Dysphagia following cerebral infarction: Secondary | ICD-10-CM | POA: Diagnosis not present

## 2022-11-10 DIAGNOSIS — I6601 Occlusion and stenosis of right middle cerebral artery: Secondary | ICD-10-CM

## 2022-11-10 NOTE — Progress Notes (Signed)
Speech Language Pathology Daily Session Note  Patient Details  Name: Leslie [name] MRN: [id number] Date of Birth: 31-Jan-1941  Today's Date: 11/10/2022 SLP Individual Time: 0731-0830 SLP Individual Time Calculation (min): 59 min  Short Term Goals: Week 1: SLP Short Term Goal 1 (Week 1): Pt will consume current diet with minimal overt s/sx of aspiration and min A verbal cues for implementation of safe swallowing precautions/strategies SLP Short Term Goal 2 (Week 1): Patient will recall and demonstrate use of memory compensations to increase independence and safety at home with mod A verbal cues. SLP Short Term Goal 3 (Week 1): Patient will demonstrate functional problem solving for mildly complex tasks with Mod A verbal cues. SLP Short Term Goal 4 (Week 1): Patient will demonstrate sustained attention to a functional task for 10 minutes with min A verbal cues for redirection. SLP Short Term Goal 5 (Week 1): Pt will demonstrate intellectual awareness of deficits by verbalizing cause of hospitalization (stroke) and current deficits with mod A verbal cues  Skilled Therapeutic Interventions: Skilled ST services focused on cognition and swallow skills. Pt expressed anxiety pertaining to the amount of therapy time on schedule, SLP provided education on each type of therapy which appeared to ease anxiety. Pt demonstrated reduced recall, repeating stories and reduced sustained attention fading after 15 minutes, expressing fatigue. Pt then required min A verbal cues in 5-7 minute intervals. SLP set up oral care, pt completed with min A verbal cues. Pt consumed thin liquids via ice chips, TSP and medication cup (5cc and 10 cc.) Pt demonstrated delay in swallow initiation, swishing initially with TSP and piecemeal swallow. Pt demonstrated anterior right spillage with TSP and none with medication cup or ice chips. Pt demonstrated delayed cough x1 on TSP only. SLP initiated water protocol and provided  education. SLP facilitated PO intake of nectar thick liquids and dys 3 texture breakfast tray, pt required mod increasing to max A verbal cues for sustained attention in 5 minute intervals due to fatigue/ pt only consumed 10% of meal due to fatigue. Pt was left in bed to rest between sessions with alarm set. Recommend to continue ST services.      Pain Pain Assessment Pain Score: 0-No pain  Therapy/Group: Individual Therapy  Trevious Rampey  [facility] 11/10/2022, 12:18 PM

## 2022-11-10 NOTE — Progress Notes (Signed)
Occupational Therapy Session Note  Patient Details  Name: Leslie [name] MRN: [id number] Date of Birth: 11-12-1941  Today's Date: 11/10/2022 OT Individual Time: 1696-7893 OT Individual Time Calculation (min): 60 min    Short Term Goals: Week 1:  OT Short Term Goal 1 (Week 1): Pt will attend to L visual field/side of body during functional activities with min A OT Short Term Goal 2 (Week 1): Pt will tolerate standing using LRAD during functional activties/BADLs for 3 minutes OT Short Term Goal 3 (Week 1): Pt will complete toilet transfer CGA using LRAD OT Short Term Goal 4 (Week 1): Pt will complete LB dressing min A  Skilled Therapeutic Interventions/Progress Updates:    Patient in bed resting upon arrival, patient agreed to BADL of a bird bath at sink LOF.  The pt was able to transfer from supine to EOB with CGA, she was able to transfer from EOB to w/c LOF with MinA using a stand pivot transfer, the pt presented with a  scuffling gait when transferring. The pt was able to wash her face and UB with s/u assist and initial cues. The pt was  able to wash private area and LB with MinA with additional time and several rest breaks secondary to nausea.  The pt was able to dress her UB  for a over head shirt, with s/u assist and ModA for her LB donning of pull-up  and pants.  The pt was ModA for donning her sock.  The pt was able to transfer from the w/c back to bed with CGA with her call light and bedside table in place and her bed alarm activated. All additional needs were addressed prior to exiting the room.   Therapy Documentation Precautions:  Precautions Precautions: Fall Precaution Comments: dys3, nectar, BPPV, L inattention Restrictions Weight Bearing Restrictions: No   Therapy/Group: Individual Therapy  Yvonne Kendall 11/10/2022, 6:41 PM

## 2022-11-10 NOTE — Progress Notes (Signed)
Patient complained of not having a bowl movement and that she felt awful. Patient's last bowl movement was 11/29. Offered bisacodyl suppository and patient eagerly accepted. Had results and patient is relieved. Patient's bottom is red will request cream.

## 2022-11-10 NOTE — Progress Notes (Signed)
PROGRESS NOTE   Subjective/Complaints: Pt up with SLP. Worried that she won't be able to tolerate therapies on schedule! Otherwise doing well! Pt had bm with suppository  ROS: Patient denies fever, rash, sore throat, blurred vision, dizziness, nausea, vomiting, diarrhea, cough, shortness of breath or chest pain, joint or back/neck pain, headache, or mood change.    Objective:   No results found. Recent Labs    11/08/22 0146 11/09/22 0555  WBC 14.2* 14.7*  HGB 13.1 13.5  HCT 40.8 42.1  PLT 312 367   Recent Labs    11/08/22 0146 11/09/22 0555  NA 139 139  K 3.7 3.5  CL 103 99  CO2 26 28  GLUCOSE 105* 118*  BUN 17 20  CREATININE 1.13* 1.22*  CALCIUM 9.2 9.8    Intake/Output Summary (Last 24 hours) at 11/10/2022 1213 Last data filed at 11/09/2022 1809 Gross per 24 hour  Intake 217 ml  Output --  Net 217 ml        Physical Exam: Vital Signs Blood pressure 127/89, pulse 97, temperature 98 F (36.7 C), resp. rate 16, height 5' 7.5" (1.715 m), weight 68.1 kg, SpO2 99 %.  General: Alert and oriented x 3, No apparent distress HEENT: Head is normocephalic, atraumatic, PERRLA, EOMI, sclera anicteric, oral mucosa pink and moist, dentition intact, ext ear canals clear,  Neck: Supple without JVD or lymphadenopathy Heart: Reg rate and rhythm. No murmurs rubs or gallops Chest: CTA bilaterally without wheezes, rales, or rhonchi; no distress Abdomen: Soft, non-tender, non-distended, bowel sounds positive. Extremities: No clubbing, cyanosis, or edema. Pulses are 2+ Psych: Pt's affect is appropriate. Pt is cooperative Skin: Clean and intact without signs of breakdown Neuro:  Left central 7. Mild dysarthria. Fair insight and awareness. Needs redirection at times.  Moves left arm and leg well  4/5. ?mild right gaze preference. Sensory exam normal for light touch and pain in all 4 limbs. No limb ataxia or cerebellar signs. No  abnormal tone appreciated.   Musculoskeletal: Full ROM, No pain with AROM or PROM in the neck, trunk, or extremities. Posture appropriate     Assessment/Plan: 1. Functional deficits which require 3+ hours per day of interdisciplinary therapy in a comprehensive inpatient rehab setting. Physiatrist is providing close team supervision and 24 hour management of active medical problems listed below. Physiatrist and rehab team continue to assess barriers to discharge/monitor patient progress toward functional and medical goals  Care Tool:  Bathing    Body parts bathed by patient: Right arm, Left arm, Chest, Abdomen, Front perineal area, Right upper leg, Left upper leg, Face   Body parts bathed by helper: Right lower leg, Buttocks, Left lower leg     Bathing assist Assist Level: Moderate Assistance - Patient 50 - 74%     Upper Body Dressing/Undressing Upper body dressing   What is the patient wearing?: Pull over shirt    Upper body assist Assist Level: Minimal Assistance - Patient > 75%    Lower Body Dressing/Undressing Lower body dressing      What is the patient wearing?: Pants     Lower body assist Assist for lower body dressing: Maximal Assistance - Patient 25 -  49%     Toileting Toileting Toileting Activity did not occur Landscape architect and hygiene only): Refused  Toileting assist       Transfers Chair/bed transfer  Transfers assist     Chair/bed transfer assist level: Minimal Assistance - Patient > 75%     Locomotion Ambulation   Ambulation assist   Ambulation activity did not occur: Safety/medical concerns (unable d/t increased dizziness and nausea from BPPV)          Walk 10 feet activity   Assist  Walk 10 feet activity did not occur: Safety/medical concerns        Walk 50 feet activity   Assist Walk 50 feet with 2 turns activity did not occur: Safety/medical concerns         Walk 150 feet activity   Assist Walk 150 feet  activity did not occur: Safety/medical concerns         Walk 10 feet on uneven surface  activity   Assist Walk 10 feet on uneven surfaces activity did not occur: Safety/medical concerns         Wheelchair     Assist Is the patient using a wheelchair?: Yes Type of Wheelchair: Manual Wheelchair activity did not occur: Safety/medical concerns (unable d/t increased dizziness and nausea from BPPV)         Wheelchair 50 feet with 2 turns activity    Assist    Wheelchair 50 feet with 2 turns activity did not occur: Safety/medical concerns       Wheelchair 150 feet activity     Assist  Wheelchair 150 feet activity did not occur: Safety/medical concerns       Blood pressure 127/89, pulse 97, temperature 98 F (36.7 C), resp. rate 16, height 5' 7.5" (1.715 m), weight 68.1 kg, SpO2 99 %.  Medical Problem List and Plan: 1. 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 2.  Antithrombotics: -DVT/anticoagulation:  Pharmaceutical: Eliquis             -antiplatelet therapy: N/A 3. Pain Management: N/A 4. Mood/Behavior/Sleep: LCSW to follow for evaluation and support.              -antipsychotic agents: N/A 5. Neuropsych/cognition: This patient maybe capable of making decisions on her own behalf. 6. Skin/Wound Care: Routine pressure relief measures.  7. Fluids/Electrolytes/Nutrition: Monitor I/O. Check CMET in am     Latest Ref Rng & Units 11/09/2022    5:55 AM 11/08/2022    1:46 AM 11/07/2022    1:33 AM  BMP  Glucose 70 - 99 mg/dL 118  105  118   BUN 8 - 23 mg/dL '20  17  17   '$ Creatinine 0.44 - 1.00 mg/dL 1.22  1.13  1.12   Sodium 135 - 145 mmol/L 139  139  135   Potassium 3.5 - 5.1 mmol/L 3.5  3.7  3.8   Chloride 98 - 111 mmol/L 99  103  101   CO2 22 - 32  mmol/L '28  26  25   '$ Calcium 8.9 - 10.3 mg/dL 9.8  9.2  8.8   Renal fxn stable 12/1   8. MVR/A fib: Monitor HR TID--on Eliquis  and Cardizem but heart rate seems to be uncontrolled.             --monitor for symptoms with increase in activity.              --plans for CV in the future.             --heart healthy diet. 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   -monitor for swallowing/tolerance of  cardizem CD   9. HTN: Monitor BP TID--on Cardizem and Lasix-->recommendations to d/c Lasix and start aldactone in am.              --lasix to be used prn overload.      Vitals:    11/08/22 2005 11/09/22 0416  BP: 117/69 115/82  Pulse: (!) 56 82  Resp: 16 16  Temp: 97.8 F (36.6 C) 97.8 F (36.6 C)  SpO2: 97% 98%    12/2 bp well controlled   10. Leucocytosis:  Monitor for fevers and other signs of infection.             12/2 no signs at present     Latest Ref Rng & Units 11/09/2022    5:55 AM 11/08/2022    1:46 AM 11/07/2022    1:33 AM  CBC  WBC 4.0 - 10.5 K/uL 14.7  14.2  11.8   Hemoglobin 12.0 - 15.0 g/dL 13.5  13.1  11.7   Hematocrit 36.0 - 46.0 % 42.1  40.8  37.2   Platelets 150 - 400 K/uL 367  312  287     11. Acute renal failure:  SCr 0.98-->1.25-->1.13 12. CAP: Has completed antibiotic regimen 11/05/22.             --monitor for hypoxia/dyspnea-->denies respiratory symptoms.  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 No symptoms?     LOS: 2 days A FACE TO FACE EVALUATION WAS PERFORMED  Meredith Staggers 11/10/2022, 12:13 PM

## 2022-11-10 NOTE — Progress Notes (Addendum)
Physical Therapy Session Note  Patient Details  Name: Leslie [name] MRN: [id number] Date of Birth: 1941-10-27  Today's Date: 11/10/2022 PT Individual Time:1115-1200  -  1415-1500; 45 min, 45 min     Short Term Goals: Week 1:  PT Short Term Goal 1 (Week 1): Pt will initiate BPPV retraining. PT Short Term Goal 2 (Week 1): Pt will perform bed mobility with overall supervision. PT Short Term Goal 3 (Week 1): Pt will initiate standing transfers with overall CGA and reduced dizziness and nausea. PT Short Term Goal 4 (Week 1): Pt will initiate gait training. PT Short Term Goal 5 (Week 1): Pt will initiate stair training.  Skilled Therapeutic Interventions/Progress Updates:  Tx 1:  Pt received asleep in bed.  She awakened to a touch of her hand.  She stated that she had a "little HA".  Rest breaks provided and repositioning provided during session.   Pt reported 0 vertigo in supine with HOB at 25 degrees and 2 pillows under head. 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  neuromuscular re-education via demo and multimodal cues and assistance PRN  in supine with HOB raised;  L heel slides, R heel slides, bil bridging, bil adductor squeezes  Unable to coordinate alternating heel slides. R/L straight leg raises with focus on eccentric control, R/L ankle raises.  With [facility] raised and using railing, supine> sit wth supervision.  Pt had immediate nausea, but it lessened with focusing on paper on floor, and keeping head still.  Pt tolerated sitting x 2 minutes.  Sit> stand with CGA and mod cues for technique, RW.  Pt tolerated standing with visual focus something about 2' away and shoulder level x 30 seconds.  Stand> sit wth CGA, due to nausea.  Sit> supine with supervision. Nausea immediately resolved.  At conclusion of session,  pt resting in bed with needs at hand and alarm set.  Tx 2:  Pt  received propped up in bed.  Brother present.  She rated R hip pain 5/10.  No nausea at rest. Activity and getting OOB decreased pt's pain during session.  Supine> sit to R with HOB raised and use of rail, superversion.  Pt sat EOB x 1 minute focusing on a tissue on the floor as nausea lessened.  Squat pivot transfer to L to wc with mod assist. Pt willing to attempt gait.  Sit> stand from wc with CGA.  Gait with SPC (AD she previously used, per pt) x 15' x 2 with min assist, hand hold. distance limited by nausea. Pt with apparent tremors LUE and trunk. Pt had difficulty sequencing stepping and use of SPC.  When resting after short distance gait training, pt focused visually on an object on the floor in front of her. Ongoing education about need to keep head still and focus on stationary object when nausea starts.    At end of session, pt resting in bed with needs at hand, and alarm set. Brother, Bruce present.         Therapy Documentation Precautions:  Precautions Precautions: Fall Precaution Comments: dys3, nectar, BPPV, L inattention Restrictions Weight Bearing Restrictions: No       Therapy/Group: Individual Therapy  Lupita Rosales 11/10/2022, 4:49 PM

## 2022-11-11 MED ORDER — GERHARDT'S BUTT CREAM
TOPICAL_CREAM | Freq: Every day | CUTANEOUS | Status: DC
  Administered 2022-11-14: 1 via TOPICAL
  Filled 2022-11-11: qty 1

## 2022-11-12 DIAGNOSIS — N179 Acute kidney failure, unspecified: Secondary | ICD-10-CM | POA: Diagnosis not present

## 2022-11-12 DIAGNOSIS — I5022 Chronic systolic (congestive) heart failure: Secondary | ICD-10-CM

## 2022-11-12 DIAGNOSIS — K5900 Constipation, unspecified: Secondary | ICD-10-CM

## 2022-11-12 DIAGNOSIS — I6601 Occlusion and stenosis of right middle cerebral artery: Secondary | ICD-10-CM | POA: Diagnosis not present

## 2022-11-12 DIAGNOSIS — I10 Essential (primary) hypertension: Secondary | ICD-10-CM | POA: Diagnosis not present

## 2022-11-12 LAB — BASIC METABOLIC PANEL
Anion gap: 11 (ref 5–15)
BUN: 27 mg/dL — ABNORMAL HIGH (ref 8–23)
CO2: 28 mmol/L (ref 22–32)
Calcium: 9.8 mg/dL (ref 8.9–10.3)
Chloride: 99 mmol/L (ref 98–111)
Creatinine, Ser: 1.25 mg/dL — ABNORMAL HIGH (ref 0.44–1.00)
GFR, Estimated: 44 mL/min — ABNORMAL LOW (ref >60)
Glucose, Bld: 108 mg/dL — ABNORMAL HIGH (ref 70–99)
Potassium: 3.8 mmol/L (ref 3.5–5.1)
Sodium: 138 mmol/L (ref 135–145)

## 2022-11-12 MED ORDER — SORBITOL 70 % SOLN
30.0000 mL | Freq: Once | Status: AC
  Administered 2022-11-12: 30 mL via ORAL
  Filled 2022-11-12: qty 30

## 2022-11-12 MED ORDER — SPIRONOLACTONE 25 MG PO TABS
25.0000 mg | ORAL_TABLET | Freq: Every day | ORAL | Status: DC
  Administered 2022-11-15: 25 mg via ORAL
  Filled 2022-11-12: qty 1

## 2022-11-12 NOTE — Progress Notes (Addendum)
PROGRESS NOTE   Subjective/Complaints: Pt in bed. Reports she feels constipated. No additional concerns.   Review of Systems  Constitutional:  Negative for chills and fever.  HENT:  Negative for congestion.   Eyes:  Negative for blurred vision.  Respiratory:  Negative for shortness of breath.   Cardiovascular:  Negative for chest pain.  Gastrointestinal:  Positive for constipation and nausea.  Genitourinary: Negative.   Neurological:  Positive for weakness. Negative for headaches.      Objective:   No results found. No results for input(s): "WBC", "HGB", "HCT", "PLT" in the last 72 hours.  Recent Labs    11/12/22 0747  NA 138  K 3.8  CL 99  CO2 28  GLUCOSE 108*  BUN 27*  CREATININE 1.25*  CALCIUM 9.8    No intake or output data in the 24 hours ending 11/12/22 1249       Physical Exam: Vital Signs Blood pressure 124/66, pulse 99, temperature 97.6 F (36.4 C), temperature source Oral, resp. rate 18, height 5' 7.5" (1.715 m), weight 65.7 kg, SpO2 99 %.  General: Alert and oriented x 3, No apparent distress HEENT: Head is normocephalic, atraumatic, PERRLA, EOMI, sclera anicteric, oral mucosa pink and moist, dentition intact, ext ear canals clear,  Neck: Supple without JVD or lymphadenopathy Heart: Reg rate and rhythm. No murmurs rubs or gallops Chest: CTA bilaterally without wheezes, rales, or rhonchi; no distress Abdomen: Soft, non-tender, mildly-distended, bowel sounds positive. Extremities: No clubbing, cyanosis, or edema. Pulses are 2+ Psych: Pt's affect is appropriate. Pt is cooperative Skin: Clean and intact without signs of breakdown Neuro:  Left central 7. Mild dysarthria. Fair insight and awareness. Needs redirection at times.  Moves left arm and leg well  4/5. ?mild right gaze preference. Sensory exam normal for light touch and pain in all 4 limbs. No limb ataxia or cerebellar signs. No abnormal  tone appreciated.   Musculoskeletal: Full ROM, No pain with AROM or PROM in the neck, trunk, or extremities. Posture appropriate     Assessment/Plan: 1. Functional deficits which require 3+ hours per day of interdisciplinary therapy in a comprehensive inpatient rehab setting. Physiatrist is providing close team supervision and 24 hour management of active medical problems listed below. Physiatrist and rehab team continue to assess barriers to discharge/monitor patient progress toward functional and medical goals  Care Tool:  Bathing    Body parts bathed by patient: Right arm, Left arm, Chest, Abdomen, Front perineal area, Right upper leg, Left upper leg, Face   Body parts bathed by helper: Right lower leg, Buttocks, Left lower leg     Bathing assist Assist Level: Moderate Assistance - Patient 50 - 74%     Upper Body Dressing/Undressing Upper body dressing   What is the patient wearing?: Pull over shirt    Upper body assist Assist Level: Minimal Assistance - Patient > 75%    Lower Body Dressing/Undressing Lower body dressing      What is the patient wearing?: Pants     Lower body assist Assist for lower body dressing: Maximal Assistance - Patient 25 - 49%     Toileting Toileting Toileting Activity did not occur Arboriculturist  management and hygiene only): Refused  Toileting assist Assist for toileting: Dependent - Patient 0%     Transfers Chair/bed transfer  Transfers assist     Chair/bed transfer assist level: Contact Guard/Touching assist     Locomotion Ambulation   Ambulation assist   Ambulation activity did not occur: Safety/medical concerns (unable d/t increased dizziness and nausea from BPPV)  Assist level: Minimal Assistance - Patient > 75% Assistive device: Cane-straight Max distance: 15   Walk 10 feet activity   Assist  Walk 10 feet activity did not occur: Safety/medical concerns  Assist level: Minimal Assistance - Patient > 75% Assistive  device: Cane-straight   Walk 50 feet activity   Assist Walk 50 feet with 2 turns activity did not occur: Safety/medical concerns (nausea)         Walk 150 feet activity   Assist Walk 150 feet activity did not occur: Safety/medical concerns (nausea)         Walk 10 feet on uneven surface  activity   Assist Walk 10 feet on uneven surfaces activity did not occur: Safety/medical concerns (nausea)         Wheelchair     Assist Is the patient using a wheelchair?: Yes Type of Wheelchair: Manual Wheelchair activity did not occur: Safety/medical concerns (unable d/t increased dizziness and nausea from BPPV)         Wheelchair 50 feet with 2 turns activity    Assist    Wheelchair 50 feet with 2 turns activity did not occur: Safety/medical concerns       Wheelchair 150 feet activity     Assist  Wheelchair 150 feet activity did not occur: Safety/medical concerns       Blood pressure 124/66, pulse 99, temperature 97.6 F (36.4 C), temperature source Oral, resp. rate 18, height 5' 7.5" (1.715 m), weight 65.7 kg, SpO2 99 %.  Medical Problem List and Plan: 1. 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 2.  Antithrombotics: -DVT/anticoagulation:  Pharmaceutical: Eliquis             -antiplatelet therapy: N/A 3. Pain Management: N/A 4. Mood/Behavior/Sleep: LCSW to follow for evaluation and support.              -antipsychotic agents: N/A 5. Neuropsych/cognition: This patient maybe capable of making decisions on her own behalf. 6. Skin/Wound Care: Routine pressure relief measures.  7. Fluids/Electrolytes/Nutrition: Monitor I/O. Check CMET in am     Latest Ref Rng & Units 11/09/2022    5:55 AM 11/08/2022    1:46 AM 11/07/2022    1:33 AM  BMP  Glucose  70 - 99 mg/dL 118  105  118   BUN 8 - 23 mg/dL '20  17  17   '$ Creatinine 0.44 - 1.00 mg/dL 1.22  1.13  1.12   Sodium 135 - 145 mmol/L 139  139  135   Potassium 3.5 - 5.1 mmol/L 3.5  3.7  3.8   Chloride 98 - 111 mmol/L 99  103  101   CO2 22 - 32 mmol/L '28  26  25   '$ Calcium 8.9 - 10.3 mg/dL 9.8  9.2  8.8   Renal fxn stable  12/1   8. MVR/A fib: Monitor HR TID--on Eliquis and Cardizem but heart rate seems to be uncontrolled.             --monitor for symptoms with increase in activity.              --plans for CV in the future.             --heart healthy diet. 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   -monitor for swallowing/tolerance of  cardizem CD   Filed Weights   11/11/22 0500 11/12/22 0500 11/12/22 0837  Weight: 67.9 kg 67.4 kg 65.7 kg  11/14 Weight a little down today    9. HTN: Monitor BP TID--on Cardizem and Lasix-->recommendations to d/c Lasix and start aldactone in am.              --lasix to be used prn overload.      Vitals:    11/08/22 2005 11/09/22 0416  BP: 117/69 115/82  Pulse: (!) 56 82  Resp: 16 16  Temp: 97.8 F (36.6 C) 97.8 F (36.6 C)  SpO2: 97% 98%    12/4 Bp well controlled, will hold spironolactone for 2 days  10. Leucocytosis:  Monitor for fevers and other signs of infection.             12/2 no signs at present     Latest Ref Rng & Units 11/09/2022    5:55 AM 11/08/2022    1:46 AM 11/07/2022    1:33 AM  CBC  WBC 4.0 - 10.5 K/uL 14.7  14.2  11.8   Hemoglobin 12.0 - 15.0 g/dL 13.5  13.1  11.7   Hematocrit 36.0 - 46.0 % 42.1  40.8  37.2   Platelets 150 - 400 K/uL 367  312  287     11. Acute renal failure:  SCr 0.98-->1.25-->1.13  -12/4 Scr up to 1.25 Bun 27, encourage oral fluids, hold aldactone, recheck Wednesday  12. CAP: Has completed antibiotic regimen 11/05/22.             --monitor for hypoxia/dyspnea-->denies respiratory symptoms.  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 No symptoms?  14. Constipation   -12/4 - LBM 12/1, order  sorbitol '30mg'$     LOS: 4 days A FACE TO FACE EVALUATION WAS PERFORMED  Jennye Boroughs 11/12/2022, 12:49 PM

## 2022-11-12 NOTE — Progress Notes (Signed)
Occupational Therapy Session Note  Patient Details  Name: Leslie [name] MRN: [id number] Date of Birth: May 14, 1941  Today's Date: 11/12/2022 OT Individual Time: 1131-1201 OT Individual Time Calculation (min): 30 min    Short Term Goals: Week 1:  OT Short Term Goal 1 (Week 1): Pt will attend to L visual field/side of body during functional activities with min A OT Short Term Goal 2 (Week 1): Pt will tolerate standing using LRAD during functional activties/BADLs for 3 minutes OT Short Term Goal 3 (Week 1): Pt will complete toilet transfer CGA using LRAD OT Short Term Goal 4 (Week 1): Pt will complete LB dressing min A  Skilled Therapeutic Interventions/Progress Updates:  Pt greeted supine in bed, pt agreeable to OT intervention. Pt completed supine>sit with superivison using BPPV compensatory methods as needed. Once EOB pt however reports dizziness needing to sit EOB. Took this opportunity to discuss ELOS with therapy time estimating 2 weeks.   Pt completed stand pivot to w/c with Rw and CGA, pt requested to complete grooming tasks at sink with overall MIN A as pt began brushing teeth wit no paste initially. Pt also tried to begin brushing her hair in the middle of oral care needing cues to attend to one task at at time.                Ended session with pt seated in w/c with all needs within reach and safety belt alarm activated.                    Therapy Documentation Precautions:  Precautions Precautions: Fall Precaution Comments: dys3, nectar, BPPV, L inattention Restrictions Weight Bearing Restrictions: No    Pain: no pain     Therapy/Group: Individual Therapy  Precious Haws 11/12/2022, 12:26 PM

## 2022-11-12 NOTE — Progress Notes (Signed)
Occupational Therapy Session Note  Patient Details  Name: Leslie [name] MRN: [id number] Date of Birth: Jun 13, 1941  Today's Date: 11/12/2022 OT Individual Time: 0803-0900 OT Individual Time Calculation (min): 57 min    Short Term Goals: Week 1:  OT Short Term Goal 1 (Week 1): Pt will attend to L visual field/side of body during functional activities with min A OT Short Term Goal 2 (Week 1): Pt will tolerate standing using LRAD during functional activties/BADLs for 3 minutes OT Short Term Goal 3 (Week 1): Pt will complete toilet transfer CGA using LRAD OT Short Term Goal 4 (Week 1): Pt will complete LB dressing min A  Skilled Therapeutic Interventions/Progress Updates:     Pt received siting up in bed finishing breakfast. Pt reporting 0/10. Pt reporting concerns of not being able to participate in therapy session during IPR program d/t BPPV. Pt provided increased education on IPR, therapeutic encouragement, motivation to participate in sessions with noted improvement in moral following.  While eating breakfast, Pt attempting to speak while consuming food with spillage noted on L side of mouth and anteriorly. Pt required mod verbal cues to chew food/swallow water prior to speaking. Pt reporting she is having a hard time taking medcations with most success occurring when taking them with chocolate medication. Rn in/out to provide medications and notified of Pt request to take medications with chocolate ice cream. Pt tolerated sitting EOB for ~10 minutes while finishing breakfast and taking medications with mod reports of nausea. Pt requested to use restroom completing squat pivot transfer to wc min HHA +time. Pt transported to bathroom total A in wc squat pivot to toilet min A +time. Pt provided increased time on toilet d/t feeling need for bowel movement (continent void, no BM at this time). Pt conned brief and pants sitting on toilet with min A to weave feet +time. Pt sit>stand using RW CGA min  A to bring pants to waist. Pt completed side steps using RW CGA to return to wc with mod nausea symptoms reported. Provided rest break seated in wc. Pt completed grooming tasks seated at sink in wc (washing hands, brushing teeth, combing hair, washing face) with set-up assist and min verbal cueing to completely clean L side of mouth. Pt completed stand step transfer EOB using RW and returned to bed CGA with increased time. Pt was left resting in bed with call bell in reach, bed alarm on, and all needs met.   Therapy Documentation Precautions:  Precautions Precautions: Fall Precaution Comments: dys3, nectar, BPPV, L inattention Restrictions Weight Bearing Restrictions: No General:   Vital Signs: Therapy Vitals Temp: 97.6 F (36.4 C) Temp Source: Oral Pulse Rate: 99 Resp: 18 BP: 124/66 Patient Position (if appropriate): Lying Oxygen Therapy SpO2: 99 % O2 Device: Room Air Pain:   ADL: ADL Eating: Supervision/safety Where Assessed-Eating: Bed level Grooming: Setup Where Assessed-Grooming: Edge of bed Upper Body Bathing: Minimal assistance Where Assessed-Upper Body Bathing: Edge of bed, Bed level Lower Body Bathing: Moderate assistance Where Assessed-Lower Body Bathing: Edge of bed, Bed level Upper Body Dressing: Minimal assistance Where Assessed-Upper Body Dressing: Edge of bed Lower Body Dressing: Maximal assistance Where Assessed-Lower Body Dressing: Edge of bed, Bed level Toileting: Not assessed (Pt refusing to use bathroom during session) Toilet Transfer: Not assessed Tub/Shower Transfer: Not assessed Social research officer, government: Not assessed  Therapy/Group: Individual Therapy  Janey Genta 11/12/2022, 8:10 AM

## 2022-11-12 NOTE — Progress Notes (Signed)
Speech Language Pathology Daily Session Note  Patient Details  Name: Leslie [name] MRN: [id number] Date of Birth: 1941/06/27  Today's Date: 11/12/2022 SLP Individual Time: 1000-1100 SLP Individual Time Calculation (min): 60 min  Short Term Goals: Week 1: SLP Short Term Goal 1 (Week 1): Pt will consume current diet with minimal overt s/sx of aspiration and min A verbal cues for implementation of safe swallowing precautions/strategies SLP Short Term Goal 2 (Week 1): Patient will recall and demonstrate use of memory compensations to increase independence and safety at home with mod A verbal cues. SLP Short Term Goal 3 (Week 1): Patient will demonstrate functional problem solving for mildly complex tasks with Mod A verbal cues. SLP Short Term Goal 4 (Week 1): Patient will demonstrate sustained attention to a functional task for 10 minutes with min A verbal cues for redirection. SLP Short Term Goal 5 (Week 1): Pt will demonstrate intellectual awareness of deficits by verbalizing cause of hospitalization (stroke) and current deficits with mod A verbal cues  Skilled Therapeutic Interventions: Skilled ST treatment focused on swallowing and cognitive-linguistic goals. Pt was received in bed on arrival and in good spirits. Pt had no recall of discussing water protocol with SLP on Saturday. SLP reviewed water protocol instructions with mod A verbal reinforcement for understanding. Pt performed oral care with sup A prior to consumption of thin water. Pt consumed small, single sips with what appeared to be mildly delayed oral transit, without overt s/sx of aspiration, and clear vocal quality post swallows. Continue currently prescribed diet (Dys3/nectar thick) with continuation of water protocol.   Pt was perceived as verbose and required frequent verbal redirection for attention to today's tasks. Pt sustained attention for max 5 minute intervals with mod A verbal redirection cues. Pt would typically go 30  seconds-to-1 minute before requiring redirection cues. Pt often repeated stories she told therapist at initial eval.   SLP initiated "solving daily math problems" via the ALFA although unable to complete due to time constraints. Pt obtained 1/4 accurate at independent level, progressing to 2/4 accuracy with max A verbal cues and use of calculator. Pt required max A to navigate phone in order to unlock phone and locate calculator app.   Pt exhibited increased awareness to swallowing deficits and L facial asymmetry, however continues to exhibit decreased insight into other physical and cognitive deficits s/p CVA requiring mod-to-max A verbal cues for awareness. Nonetheless, pt agreeable to cognitive rehab, as she stated "I think it's important to keep your mind sharp."  Patient was left in bed with alarm activated and immediate needs within reach at end of session. Continue per current plan of care.      Pain Pain Assessment Pain Scale: 0-10 Pain Score: 0-No pain  Therapy/Group: Individual Therapy  Patty Sermons 11/12/2022, 10:24 AM

## 2022-11-12 NOTE — Progress Notes (Signed)
Physical Therapy Session Note  Patient Details  Name: Ladesha Pacini MRN: [id number] Date of Birth: 1941/11/22  Today's Date: 11/12/2022 PT Individual Time: 4098-1191 PT Individual Time Calculation (min): 48 min   Short Term Goals: Week 1:  PT Short Term Goal 1 (Week 1): Pt will initiate BPPV retraining. PT Short Term Goal 2 (Week 1): Pt will perform bed mobility with overall supervision. PT Short Term Goal 3 (Week 1): Pt will initiate standing transfers with overall CGA and reduced dizziness and nausea. PT Short Term Goal 4 (Week 1): Pt will initiate gait training. PT Short Term Goal 5 (Week 1): Pt will initiate stair training.  Skilled Therapeutic Interventions/Progress Updates:     PT recommended to perform vestibular assessment and pt agreeable. Pt reports history of "vertigo". Pt unable to state whether she had BPPV or what canal was affected. Pt also reports prior to admission she would perform self-maneuvers to address her dizziness.  Pt reports nausea as primary symptom related to her dizziness that started following R MCA infarct. Pt states dizzines is provoked from changes in positions, sit to stand transfers and only improves with returning to sitting and lying down. Pt estimates dizziness occurs > 1 minute. Pt cervical ROM limited  and pt (-) for VBI testing bilaterally. Pt received in seated position and vitals assessed in various positions to rule out orthostatic hypotension. BP in sitting recorded as 93/81 with pulse of 59. Attempted to obtain standing BP and pt unable to tolerate position due to increase in nausea. Pt returned to bed and rested ~5 minutes prior to assessing BP and vitals listed below in flowsheet. Pt stated she needed to rest and declined further assessment. Pt missed 30 minutes of skilled physical therapy due to fatigue and nausea.   Therapy Documentation Precautions:  Precautions Precautions: Fall Precaution Comments: dys3, nectar, BPPV, L  inattention Restrictions Weight Bearing Restrictions: No   Vestibular Assessment - 11/12/22 0001     Vestibular Assessment           General Observation complaints of nausea with sitting upright and sit to stand transfers          Symptom Behavior           Subjective history of current problem complaints of nausea with positional changes    Type of Dizziness  Comment  nausea   Frequency of Dizziness occurs with positional changes    Duration of Dizziness > 1 minute    Symptom Nature Positional;Motion provoked    Aggravating Factors Activity in general;Looking up to the ceiling;Sit to stand    Relieving Factors Lying supine    Progression of Symptoms Worse    History of similar episodes pt reports history of "vertigo" with onset 3-4 years ago and performed self-manueveurs          Oculomotor Exam           Oculomotor Alignment Normal    Spontaneous Absent          Orthostatics           BP supine (x 5 minutes) 116/62    HR supine (x 5 minutes) 49    BP sitting 106/51    HR sitting 77    Orthostatics Comment positive OH, unable to attain standing measurement due to increased nausea and pt unable to tolerate position                    Therapy/Group: Individual Therapy  Verl Dicker  Verl Dicker PT, DPT  11/12/2022, 7:55 AM

## 2022-11-13 DIAGNOSIS — I6601 Occlusion and stenosis of right middle cerebral artery: Secondary | ICD-10-CM | POA: Diagnosis not present

## 2022-11-13 MED ORDER — SORBITOL 70 % SOLN
60.0000 mL | Freq: Every day | Status: DC | PRN
  Administered 2022-11-17: 60 mL via ORAL
  Filled 2022-11-13: qty 60

## 2022-11-13 MED ORDER — BISACODYL 10 MG RE SUPP: 10.0000 mg | Freq: Every day | RECTAL | Status: DC | PRN

## 2022-11-13 MED ORDER — ORAL CARE MOUTH RINSE: 15.0000 mL | Freq: Three times a day (TID) | OROMUCOSAL | 0 refills | Status: DC

## 2022-11-13 MED ORDER — ACETAMINOPHEN 325 MG PO TABS: 325.0000 mg | ORAL_TABLET | ORAL | Status: AC | PRN

## 2022-11-13 NOTE — Discharge Instructions (Addendum)
Inpatient Rehab Discharge Instructions  [place] Discharge date and time:    Activities/Precautions/ Functional Status: Activity: no lifting, driving, or strenuous exercise till cleared by MD.  Diet: cardiac diet Limit Carbs/Sweets.  Wound Care:    Functional status:  ___ No restrictions     ___ Walk up steps independently ___ 24/7 supervision/assistance   ___ Walk up steps with assistance ___ Intermittent supervision/assistance  ___ Bathe/dress independently ___ Walk with walker     ___ Bathe/dress with assistance ___ Walk Independently    ___ Shower independently ___ Walk with assistance    ___ Shower with assistance ___ No alcohol     ___ Return to work/school ________  COMMUNITY REFERRALS UPON DISCHARGE:    Home Health:   PT     OT     ST                   Agency: [facility]  Phone: [phone]    Medical Equipment/Items Ordered: Hospital Bed, Wheelchair, Rolling Walker, Bedside Commode and Suction Kit                                                 Agency/Supplier: [employer] [phone]   Special Instructions: Use abdominal binder and support stockings or wraps on legs to support BP when out of bed. Need to stay out of bed during the day to build endurance.   STROKE/TIA DISCHARGE INSTRUCTIONS SMOKING Cigarette smoking nearly doubles your risk of having a stroke & is the single most alterable risk factor  If you smoke or have smoked in the last 12 months, you are advised to quit smoking for your health. Most of the excess cardiovascular risk related to smoking disappears within a year of stopping. Ask you doctor about anti-smoking medications [place] Quit Line: 1-800-QUIT NOW Free Smoking Cessation Classes [phone]  CHOLESTEROL Know your levels; limit fat & cholesterol in your diet  Lipid Panel     Component Value Date/Time   CHOL 151 10/31/2022 0108   TRIG 61 10/31/2022 0108   HDL 53 10/31/2022 0108   CHOLHDL 2.8 10/31/2022 0108   VLDL 12 10/31/2022 0108    LDLCALC 86 10/31/2022 0108     Many patients benefit from treatment even if their cholesterol is at goal. Goal: Total Cholesterol (CHOL) less than 160 Goal:  Triglycerides (TRIG) less than 150 Goal:  HDL greater than 40 Goal:  LDL (LDLCALC) less than 100   BLOOD PRESSURE American Stroke Association blood pressure target is less that 120/80 mm/Hg  Your discharge blood pressure is:  BP: 114/75 Monitor your blood pressure Limit your salt and alcohol intake Many individuals will require more than one medication for high blood pressure  DIABETES (A1c is a blood sugar average for last 3 months) Goal HGBA1c is under 7% (HBGA1c is blood sugar average for last 3 months)  Diabetes: Pre-diabetes    Lab Results  Component Value Date   HGBA1C 6.2 (H) 10/30/2022    Your HGBA1c can be lowered with medications, healthy diet, and exercise. Check your blood sugar as directed by your physician Call your physician if you experience unexplained or low blood sugars.  PHYSICAL ACTIVITY/REHABILITATION Goal is 30 minutes at least 4 days per week  Activity: No driving, Therapies: see above Return to work: N/A Activity decreases your risk of heart attack and stroke  and makes your heart stronger.  It helps control your weight and blood pressure; helps you relax and can improve your mood. Participate in a regular exercise program. Talk with your doctor about the best form of exercise for you (dancing, walking, swimming, cycling).  DIET/WEIGHT Goal is to maintain a healthy weight  Your discharge diet is:  Diet Order             DIET DYS 3 Room service appropriate? Yes; Fluid consistency: Thin  Diet effective now                   liquids Your height is:  Height: 5' 7.5" (171.5 cm) Your current weight is: Weight: 66.9 kg Your Body Mass Index (BMI) is:  BMI (Calculated): 22.75 Following the type of diet specifically designed for you will help prevent another stroke. You are at goal weight   Your goal  Body Mass Index (BMI) is 19-24. Healthy food habits can help reduce 3 risk factors for stroke:  High cholesterol, hypertension, and excess weight.  RESOURCES Stroke/Support Group:  Call [phone]   STROKE EDUCATION PROVIDED/REVIEWED AND GIVEN TO PATIENT Stroke warning signs and symptoms How to activate emergency medical system (call 911). Medications prescribed at discharge. Need for follow-up after discharge. Personal risk factors for stroke. Pneumonia vaccine given:  Flu vaccine given:  My questions have been answered, the writing is legible, and I understand these instructions.  I will adhere to these goals & educational materials that have been provided to me after my discharge from the hospital.     My questions have been answered and I understand these instructions. I will adhere to these goals and the provided educational materials after my discharge from the hospital.  Patient/Caregiver Signature _______________________________ Date __________  Clinician Signature _______________________________________ Date __________  Please bring this form and your medication list with you to all your follow-up doctor's appointments.

## 2022-11-13 NOTE — Progress Notes (Signed)
Patient ID: Leslie [name], female   DOB: 1941/06/20, 81 y.o.   MRN: [id number] Met with the patient to review current situation, team conference, rehab process, and plan of care. Discussed secondary risks including HTN (hypotension noted recently), A-fib on meds (CV in the future per MD) with medications and dietary modifications. Patient noted she is fine with thickened liquids as long as she is able to eat. Notes pocketing has improved. Does note incontinence; accustomed to use of the purewiok while at the same time endorses plan to be independent at discharge. Does not want to move to [place] with daughter. Continue to follow along to address educational needs to facilitate preparation for discharge. Margarito Liner

## 2022-11-13 NOTE — Progress Notes (Signed)
Occupational Therapy Session Note  Patient Details  Name: Leslie [name] MRN: [id number] Date of Birth: 05/10/1941  Today's Date: 11/13/2022 OT Individual Time: 1000-1040 OT Individual Time Calculation (min): 40 min  OT Missed Time: 20 min  Short Term Goals: Week 1:  OT Short Term Goal 1 (Week 1): Pt will attend to L visual field/side of body during functional activities with min A OT Short Term Goal 2 (Week 1): Pt will tolerate standing using LRAD during functional activties/BADLs for 3 minutes OT Short Term Goal 3 (Week 1): Pt will complete toilet transfer CGA using LRAD OT Short Term Goal 4 (Week 1): Pt will complete LB dressing min A  Skilled Therapeutic Interventions/Progress Updates:     Pt sitting up in bed upon OT arrival with B thigh high TED hose and B thigh high ACE wraps for BP management donned. Pt reporting no pain and receptive to skilled OT session. Pt inquiring about previous vestibular eval from PT upon OT arrival. Pt provided education on vestibular eval, orthostatic hypotension symptoms and treatment. Pt continues to be anxious regarding orthostatic hypotension symptoms and challenges participating in session with therapeutic listening and encouragement provided.   Orthostatic vitals attempted to be taken during session.  Semi-reclined in bed: BP122/75 HR 55 O2 99 Sitting EOB: BP 107/89 HR 72 O2 95 Standing: Unable to obtain d/t Pt unable to maintain upright standing position for prolonged time to gather BP measurement.   Following attempt to take BP in standing, Pt returning to reclined position in bed stating "I am drained, I cannot stand anymore". Pt provided therapeutic encouragement and motivation to continue with session bed level. Pt then coming EOB supervision, reporting increase in nausea symptoms EOB, and returning to reclined position in bed. Pt denying need to complete BADLs and requesting to rest in bed. Session ended early d/t fatigue. 20 Missed skilled  OT treatment. Pt left resting in bed with call bell in reach, bed alarm on, and all needs met.   Therapy Documentation Precautions:  Precautions Precautions: Fall Precaution Comments: dys3, nectar, BPPV, L inattention Restrictions Weight Bearing Restrictions: No General:   Vital Signs: Therapy Vitals Temp: (Abnormal) 97.5 F (36.4 C) Temp Source: Oral Pulse Rate: 90 Resp: 16 BP: 129/79 Patient Position (if appropriate): Lying Oxygen Therapy SpO2: 97 % O2 Device: Room Air Pain: Pain Assessment Pain Scale: 0-10 Pain Score: 0-No pain ADL: ADL Eating: Supervision/safety Where Assessed-Eating: Bed level Grooming: Setup Where Assessed-Grooming: Edge of bed Upper Body Bathing: Minimal assistance Where Assessed-Upper Body Bathing: Edge of bed, Bed level Lower Body Bathing: Moderate assistance Where Assessed-Lower Body Bathing: Edge of bed, Bed level Upper Body Dressing: Minimal assistance Where Assessed-Upper Body Dressing: Edge of bed Lower Body Dressing: Maximal assistance Where Assessed-Lower Body Dressing: Edge of bed, Bed level Toileting: Not assessed (Pt refusing to use bathroom during session) Toilet Transfer: Not assessed Tub/Shower Transfer: Not assessed Social research officer, government: Not assessed   Therapy/Group: Individual Therapy  Janey Genta 11/13/2022, 7:59 AM

## 2022-11-13 NOTE — Progress Notes (Signed)
PROGRESS NOTE   Subjective/Complaints:   Discussed hx of dizziness and BP as well as vestibular issues   Review of Systems  Constitutional:  Negative for chills and fever.  HENT:  Negative for congestion.   Eyes:  Negative for blurred vision.  Respiratory:  Negative for shortness of breath.   Cardiovascular:  Negative for chest pain.  Gastrointestinal:  Positive for constipation and nausea.  Genitourinary: Negative.   Neurological:  Positive for weakness. Negative for headaches.      Objective:   No results found. No results for input(s): "WBC", "HGB", "HCT", "PLT" in the last 72 hours.  Recent Labs    11/12/22 0747  NA 138  K 3.8  CL 99  CO2 28  GLUCOSE 108*  BUN 27*  CREATININE 1.25*  CALCIUM 9.8     Intake/Output Summary (Last 24 hours) at 11/13/2022 0805 Last data filed at 11/12/2022 1700 Gross per 24 hour  Intake 50 ml  Output --  Net 50 ml         Physical Exam: Vital Signs Blood pressure 129/79, pulse 90, temperature (!) 97.5 F (36.4 C), temperature source Oral, resp. rate 16, height 5' 7.5" (1.715 m), weight 66.9 kg, SpO2 97 %.  General: No acute distress Mood and affect are appropriate Heart: Regular rate and rhythm no rubs murmurs or extra sounds Lungs: Clear to auscultation, breathing unlabored, no rales or wheezes Abdomen: Positive bowel sounds, soft nontender to palpation, nondistended Extremities: No clubbing, cyanosis, or edema Skin: No evidence of breakdown, no evidence of rash   Skin: Clean and intact without signs of breakdown Neuro:  Left central 7. Mild dysarthria. Fair insight and awareness. Needs redirection at times.  Moves left arm and leg well  4/5. ?mild right gaze preference. Sensory exam normal for light touch and pain in all 4 limbs. No limb ataxia or cerebellar signs. No abnormal tone appreciated.   Musculoskeletal: Full ROM, No pain with AROM or PROM in the neck,  trunk, or extremities. Posture appropriate     Assessment/Plan: 1. Functional deficits which require 3+ hours per day of interdisciplinary therapy in a comprehensive inpatient rehab setting. Physiatrist is providing close team supervision and 24 hour management of active medical problems listed below. Physiatrist and rehab team continue to assess barriers to discharge/monitor patient progress toward functional and medical goals  Care Tool:  Bathing    Body parts bathed by patient: Right arm, Left arm, Chest, Abdomen, Front perineal area, Right upper leg, Left upper leg, Face   Body parts bathed by helper: Right lower leg, Buttocks, Left lower leg     Bathing assist Assist Level: Moderate Assistance - Patient 50 - 74%     Upper Body Dressing/Undressing Upper body dressing   What is the patient wearing?: Pull over shirt    Upper body assist Assist Level: Minimal Assistance - Patient > 75%    Lower Body Dressing/Undressing Lower body dressing      What is the patient wearing?: Pants     Lower body assist Assist for lower body dressing: Maximal Assistance - Patient 25 - 49%     Toileting Toileting Toileting Activity did not occur Arboriculturist  management and hygiene only): Refused  Toileting assist Assist for toileting: Dependent - Patient 0%     Transfers Chair/bed transfer  Transfers assist     Chair/bed transfer assist level: Contact Guard/Touching assist     Locomotion Ambulation   Ambulation assist   Ambulation activity did not occur: Safety/medical concerns (unable d/t increased dizziness and nausea from BPPV)  Assist level: Minimal Assistance - Patient > 75% Assistive device: Cane-straight Max distance: 15   Walk 10 feet activity   Assist  Walk 10 feet activity did not occur: Safety/medical concerns  Assist level: Minimal Assistance - Patient > 75% Assistive device: Cane-straight   Walk 50 feet activity   Assist Walk 50 feet with 2 turns  activity did not occur: Safety/medical concerns (nausea)         Walk 150 feet activity   Assist Walk 150 feet activity did not occur: Safety/medical concerns (nausea)         Walk 10 feet on uneven surface  activity   Assist Walk 10 feet on uneven surfaces activity did not occur: Safety/medical concerns (nausea)         Wheelchair     Assist Is the patient using a wheelchair?: Yes Type of Wheelchair: Manual Wheelchair activity did not occur: Safety/medical concerns (unable d/t increased dizziness and nausea from BPPV)         Wheelchair 50 feet with 2 turns activity    Assist    Wheelchair 50 feet with 2 turns activity did not occur: Safety/medical concerns       Wheelchair 150 feet activity     Assist  Wheelchair 150 feet activity did not occur: Safety/medical concerns       Blood pressure 129/79, pulse 90, temperature (!) 97.5 F (36.4 C), temperature source Oral, resp. rate 16, height 5' 7.5" (1.715 m), weight 66.9 kg, SpO2 97 %.  Medical Problem List and Plan: 1. 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 2.  Antithrombotics: -DVT/anticoagulation:  Pharmaceutical: Eliquis             -antiplatelet therapy: N/A 3. Pain Management: N/A 4. Mood/Behavior/Sleep: LCSW to follow for evaluation and support.              -antipsychotic agents: N/A 5. Neuropsych/cognition: This patient maybe capable of making decisions on her own behalf. 6. Skin/Wound Care: Routine pressure relief measures.  7. Fluids/Electrolytes/Nutrition: Monitor I/O.      Latest Ref Rng & Units 11/09/2022    5:55 AM 11/08/2022    1:46 AM 11/07/2022    1:33 AM  BMP  Glucose 70 - 99 mg/dL 118  105  118   BUN 8 - 23 mg/dL '20  17  17   '$ Creatinine 0.44 - 1.00 mg/dL 1.22   1.13  1.12   Sodium 135 - 145 mmol/L 139  139  135   Potassium 3.5 - 5.1 mmol/L 3.5  3.7  3.8   Chloride 98 - 111 mmol/L 99  103  101   CO2 22 - 32 mmol/L '28  26  25   '$ Calcium 8.9 - 10.3 mg/dL 9.8  9.2  8.8   Renal fxn stable 12/1 Recheck  BMET in am    8. MVR/A fib: Monitor HR TID--on Eliquis and Cardizem but heart rate seems to be uncontrolled.             --monitor for symptoms with increase in activity.              --plans for CV in the future.             --heart healthy diet. 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   -monitor for swallowing/tolerance of  cardizem CD   Filed Weights   11/12/22 0500 11/12/22 0837 11/13/22 0500  Weight: 67.4 kg 65.7 kg 66.9 kg  11/14 Weight a little down today    9. HTN: Monitor BP TID--on Cardizem and Lasix-->recommendations to d/c Lasix and start aldactone in am.              --lasix to be used prn overload.      Vitals:    11/08/22 2005 11/09/22 0416  BP: 117/69 115/82  Pulse: (!) 56 82  Resp: 16 16  Temp: 97.8 F (36.6 C) 97.8 F (36.6 C)  SpO2: 97% 98%    12/4 Bp well controlled, will hold spironolactone for 2 days Will do ortho vitals , add TED hose and binder  10. Leucocytosis:  Monitor for fevers and other signs of infection.             12/2 no signs at present     Latest Ref Rng & Units 11/09/2022    5:55 AM 11/08/2022    1:46 AM 11/07/2022    1:33 AM  CBC  WBC 4.0 - 10.5 K/uL 14.7  14.2  11.8   Hemoglobin 12.0 - 15.0 g/dL 13.5  13.1  11.7   Hematocrit 36.0 - 46.0 % 42.1  40.8  37.2   Platelets 150 - 400 K/uL 367  312  287     11. Acute renal failure:  SCr 0.98-->1.25-->1.13  -12/4 Scr up to 1.25 Bun 27, encourage oral fluids, hold aldactone, recheck Wednesday  12. CAP: Has completed antibiotic regimen 11/05/22.             --monitor for hypoxia/dyspnea-->denies respiratory symptoms.  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 No symptoms?  14. Constipation   -12/4 - LBM 12/1, order sorbitol '30mg'$     LOS: 5  days A FACE TO FACE EVALUATION WAS PERFORMED  Charlett Blake 11/13/2022, 8:05 AM

## 2022-11-13 NOTE — Progress Notes (Signed)
Speech Language Pathology Daily Session Note  Patient Details  Name: Leslie [name] MRN: [id number] Date of Birth: 1941/06/02  Today's Date: 11/13/2022 SLP Individual Time: 0800-0900 SLP Individual Time Calculation (min): 60 min  Short Term Goals: Week 1: SLP Short Term Goal 1 (Week 1): Pt will consume current diet with minimal overt s/sx of aspiration and min A verbal cues for implementation of safe swallowing precautions/strategies SLP Short Term Goal 2 (Week 1): Patient will recall and demonstrate use of memory compensations to increase independence and safety at home with mod A verbal cues. SLP Short Term Goal 3 (Week 1): Patient will demonstrate functional problem solving for mildly complex tasks with Mod A verbal cues. SLP Short Term Goal 4 (Week 1): Patient will demonstrate sustained attention to a functional task for 10 minutes with min A verbal cues for redirection. SLP Short Term Goal 5 (Week 1): Pt will demonstrate intellectual awareness of deficits by verbalizing cause of hospitalization (stroke) and current deficits with mod A verbal cues  Skilled Therapeutic Interventions: Skilled ST treatment focused on swallowing and cognitive goals. Pt was consuming breakfast on arrival with mild-to-moderate anterior labial spillage on left with minimal awareness. Pt with tendency to talk with food and nectar thick liquids in oral cavity further adding to anterior spillage. Pt required mod A verbal cues for management of anterior spillage, to assess for L pocketing, and minimize talking while eating. Pt continues to be perceived as verbose, often sharing the same story she has told therapist now 3 times. Pt only able to sustain attention to tasks for ~1-2 minutes prior to requiring mod A verbal redirection cues. Pt perseverating on wanting "real water" during meal. SLP reviewed water protocol guidelines with min-to-mod A verbal cues for accurate recall. Pt performed oral care following meal with  sup A cues. Pt agreeable to wait 30 minutes in order to consume thin water per water protocol guidelines. Pt consumes single and controlled cup sips of thin water without overt s/sx of aspiration, clear vocal quality post swallows, and mild anterior labial spillage. Continue dys 3 diet with nectar thick liquids and crushed medications. Continue water protocol for continued tolerance of thin liquids prior to advancing liquids. Per recent MBS, pt had traced, sensed aspiration events with thin liquids.   SLP facilitated continuation of "solving daily math problems" via the ALFA. Pt required max A verbal/visual cues for problem solving, mod A verbal redirection cues for topic maintenance. Without verbal/visual cues, pt achieved 1/4 accurate.    Patient was left in bed with alarm activated and immediate needs within reach at end of session. Continue per current plan of care.      Pain Pain Assessment Pain Scale: 0-10 Pain Score: 0-No pain  Therapy/Group: Individual Therapy  Patty Sermons 11/13/2022, 8:59 AM

## 2022-11-13 NOTE — Progress Notes (Signed)
Physical Therapy Session Note  Patient Details  Name: Leslie [name] MRN: [id number] Date of Birth: Dec 05, 1941  Today's Date: 11/13/2022 PT Individual Time: 6720-9470 PT Individual Time Calculation (min): 34 min   Short Term Goals: Week 1:  PT Short Term Goal 1 (Week 1): Pt will initiate BPPV retraining. PT Short Term Goal 2 (Week 1): Pt will perform bed mobility with overall supervision. PT Short Term Goal 3 (Week 1): Pt will initiate standing transfers with overall CGA and reduced dizziness and nausea. PT Short Term Goal 4 (Week 1): Pt will initiate gait training. PT Short Term Goal 5 (Week 1): Pt will initiate stair training.  Skilled Therapeutic Interventions/Progress Updates:    Pt received supported upright in bed with her daughter, [name], present and pt agreeable to therapy session. Pt already wearing B LE thigh high TED hose with ACE wraps on top - nurse notified of no abdominal binder in pt's room. Vitals assessed throughout mobility during session.  Vitals in bed with HOB elevated to 44degrees: BP 122/72 (MAP 88), HR 84bpm   Vitals sitting EOB: BP 88/48 (MAP 56), HR 64bpm - pt with no strong feelings of lightheadedness, dizziness, or nausea Vitals sitting EOB after performing ~30sec of B UE overhead arm exercises: BP 88/67 (MAP 75), HR 104bpm - no symptoms stil Vitals after 3-60mn sitting EOB: BP 107/63 (MAP 77), HR 56bpm  Vitals after >157mutes sitting EOB: BP 122/71 (MAP 88), HR 57bpm  *Nurse notified of vitals and present during session to assess   Pt states she does not like drinking the thickened water. Nurse had performed oral care <3051mprior to therapist arrival and pt had not consumed anything but water since, so therapist provided pt with thin water during session with no signs/symptoms of aspiration - documented on water protocol paper.  Supine>sitting R EOB, HOB 44 degrees and using bedrail if needed, with supervision. Pt able to maintain sitting balance EOB  with supervision for safety, no instability noted. L squat pivot transfer EOB>drop-arm recliner with CGA/light min assist for steadying/rotating hips. Therapist educated pt on importance of drinking plenty of fluids and sitting upright, OOB in a chair during the day to improve her tolerance - pt verbalized understanding and agreeable.  Pt left seated in recliner with needs in reach, her daughter present (educated daughter to call nursing staff if she leaves to place seat belt alarm on pt), and the nurse aware of pt's position.   Therapy Documentation Precautions:  Precautions Precautions: Fall Precaution Comments: dys3, nectar, BPPV, L inattention Restrictions Weight Bearing Restrictions: No   Pain:  No reports of pain throughout session.    Therapy/Group: Individual Therapy  CarTawana ScalePT, DPT, NCS, CSRS 11/13/2022, 12:26 PM

## 2022-11-13 NOTE — Progress Notes (Signed)
Physical Therapy Session Note  Patient Details  Name: Leslie [name] MRN: [id number] Date of Birth: 1941/03/29  Today's Date: 11/13/2022 PT Individual Time: 0900-1000 PT Individual Time Calculation (min): 60 min   Short Term Goals: Week 1:  PT Short Term Goal 1 (Week 1): Pt will initiate BPPV retraining. PT Short Term Goal 2 (Week 1): Pt will perform bed mobility with overall supervision. PT Short Term Goal 3 (Week 1): Pt will initiate standing transfers with overall CGA and reduced dizziness and nausea. PT Short Term Goal 4 (Week 1): Pt will initiate gait training. PT Short Term Goal 5 (Week 1): Pt will initiate stair training.  Skilled Therapeutic Interventions/Progress Updates:     Patient sitting up in bed upon PT arrival. Patient alert and agreeable to PT session. Patient denied pain during session.  Discussed patient's symptoms and hx of BPPV with response to repositioning maneuvers. Reports symptoms of nausea and light headedness, reports feeling like she is drained when standing. Denies emesis. Onset following R MCA. Focused assessment on autonomic response to changes in position. Donned B thigh high TED hose and B thigh high ACE wraps for BP management in standing, RN made aware.   Orthostatic Vitals: Semi-reclined in bed: BP 118/70, HR 68  Sitting: BP 103/80, HR 133, recovered to 106 <1 min Standing: equipment malfunction in standing Sitting x5 min: BP 103/90, HR 58 Standing: equipment unable to provide vitals with patient in standing, retrieved in sitting after standing BP 106/71, HR 54  Patient symptomatic in sitting with resolution in sitting <2 min, in standing both trials with worsening symptoms requiring sitting ~45-60 sec each trial.  Therapeutic Activity: Bed Mobility: Patient performed supine to/from sit with supervision with HOB elevated to sit up and HOB flat when returning to lying. Provided verbal cues for initiation and bringing feet to the floor for  stability due to onset of symptoms at sitting. Transfers: Patient performed sit to/from stand progressing to stand pivot bed<>w/c with CGA. Provided verbal cues for diaphragmatic breathing for symptom management in standing.  Patient required increased time for redirection for verbose language, symptoms management, rest breaks, and for completion of tasks throughout session. Utilized therapeutic use of self throughout to promote efficiency.   Patient in bed at end of session with breaks locked, bed alarm set, and all needs within reach.   Therapy Documentation Precautions:  Precautions Precautions: Fall Precaution Comments: dys3, nectar, BPPV, L inattention Restrictions Weight Bearing Restrictions: No    Therapy/Group: Individual Therapy  Yekaterina Escutia L Jacalyn Biggs PT, DPT, NCS, CBIS  11/13/2022, 12:39 PM

## 2022-11-13 NOTE — Plan of Care (Signed)
  Problem: RH Swallowing Goal: LTG Patient will consume least restrictive diet using compensatory strategies with assistance (SLP) Description: LTG:  Patient will consume least restrictive diet using compensatory strategies with assistance (SLP) Flowsheets (Taken 11/13/2022 1629) LTG: Pt Patient will consume least restrictive diet using compensatory strategies with assistance of (SLP): Supervision Goal: LTG Patient will participate in dysphagia therapy to increase swallow function with assistance (SLP) Description: LTG:  Patient will participate in dysphagia therapy to increase swallow function with assistance (SLP) Flowsheets (Taken 11/13/2022 1629) LTG: Pt will participate in dysphagia therapy to increase swallow function with assistance of (SLP): Supervision Goal: LTG Pt will demonstrate functional change in swallow as evidenced by bedside/clinical objective assessment (SLP) Description: LTG: Patient will demonstrate functional change in swallow as evidenced by bedside/clinical objective assessment (SLP) Flowsheets (Taken 11/13/2022 1629) LTG: Patient will demonstrate functional change in swallow as evidenced by bedside/clinical objective assessment: Oropharyngeal swallow

## 2022-11-14 DIAGNOSIS — I6601 Occlusion and stenosis of right middle cerebral artery: Secondary | ICD-10-CM | POA: Diagnosis not present

## 2022-11-14 LAB — BASIC METABOLIC PANEL
Anion gap: 10 (ref 5–15)
BUN: 31 mg/dL — ABNORMAL HIGH (ref 8–23)
CO2: 26 mmol/L (ref 22–32)
Calcium: 9.7 mg/dL (ref 8.9–10.3)
Chloride: 101 mmol/L (ref 98–111)
Creatinine, Ser: 1.26 mg/dL — ABNORMAL HIGH (ref 0.44–1.00)
GFR, Estimated: 43 mL/min — ABNORMAL LOW (ref >60)
Glucose, Bld: 107 mg/dL — ABNORMAL HIGH (ref 70–99)
Potassium: 3.6 mmol/L (ref 3.5–5.1)
Sodium: 137 mmol/L (ref 135–145)

## 2022-11-14 MED ORDER — DILTIAZEM HCL ER COATED BEADS 300 MG PO CP24
300.0000 mg | ORAL_CAPSULE | Freq: Every day | ORAL | Status: DC
  Administered 2022-11-15 – 2022-11-18 (×4): 300 mg via ORAL
  Filled 2022-11-14 (×5): qty 1

## 2022-11-14 MED ORDER — SODIUM CHLORIDE 0.9 % IV BOLUS
250.0000 mL | Freq: Once | INTRAVENOUS | Status: AC
  Administered 2022-11-14: 250 mL via INTRAVENOUS

## 2022-11-14 MED ORDER — GERHARDT'S BUTT CREAM
TOPICAL_CREAM | Freq: Two times a day (BID) | CUTANEOUS | Status: DC
  Administered 2022-11-23: 1 via TOPICAL
  Filled 2022-11-14: qty 1

## 2022-11-14 NOTE — Progress Notes (Signed)
Occupational Therapy Session Note  Patient Details  Name: Leslie [name] MRN: [id number] Date of Birth: [birth year]  Today's Date: 11/14/2022 OT Individual Time: 0800-0910 OT Individual Time Calculation (min): 70 min    Short Term Goals: Week 1:  OT Short Term Goal 1 (Week 1): Pt will attend to L visual field/side of body during functional activities with min A OT Short Term Goal 2 (Week 1): Pt will tolerate standing using LRAD during functional activties/BADLs for 3 minutes OT Short Term Goal 3 (Week 1): Pt will complete toilet transfer CGA using LRAD OT Short Term Goal 4 (Week 1): Pt will complete LB dressing min A  Skilled Therapeutic Interventions/Progress Updates:     Pt received sitting up in bed eating breakfast under direct supervision of nursing staff. Pt handed off to OT. Pt agreeable to skilled OT session reporting 0/10 pain. TED hose and ACE doffed upon OT arrival and donned by OT with Pt sitting up in bed. Vitals monitored throughout session d/t Pt positive for orthostatic hypotension. Pt perseverating on getting water upon OT arrival. Pt encouraged to eat food and notified she will received water after 30 minutes after 30 minutes following breakfast and performing oral care. Pt ate 2-3 more bites and politely refused to eat any more until she received liquid thin water. Pt provided nectar thick water taking a few small sips and then politely refusing to drink more.   Vitals in bed HOB elevated (without TEDs/ACE wrap) : BP 104/62 (MAP 77) HR 57 BPM O2 98  RN in/out for medication management. OT and Rn collaboration to support Pt in taking medications with moderate verbal cues required to redirect Pt to swallow and not talk while taking medication. Pt requiring increased time to process verbal directions.  Pt performed bed level overhead UE exercises and BLE leg pumps ~5 minutes. Pt Transitioning from sitting up in bed to EOB supervision.  Vitals taken ~30 seconds of sitting  EOB (TED hoes and ACE wrap donned): BP 134/104 (MAP 115) HR 73 O2 95% -pt reporting feelings of nausea and increase in dizziness  Pt performed overhead exercises EOB and ankle pumps to increase BP and HR. Pt provided with therapeutic support and encouragement to tolerate upright position.  Vitals taken ~5 minutes of sitting EOB: HR 57 BP 88/63 (MAP 71) O2 100%  MD in/out for morning rounds. Md notified of Pt low BP and that Pt abdominal binder has not yet arrived.  Pt completed squat pivot transfer using RW CGA mod verbal cues for safety and body mechanics. Pt positioned at sink to complete oral care. Pt completed task with set-up A and increased time with OT using tooth brush to check for any left over food particles in preparation for drinking water.  Vitals taken Sitting in wc ~10 minutes BP 89/70 (MAP 82). Pt completed squat pivot back to bed min A using RW. Pt handed off to SLP entering room. SLP notified Pt completed oral care in preparation of drinking water. Pt was left resting in room with al immediate needs met.    Therapy Documentation Precautions:  Precautions Precautions: Fall Precaution Comments: dys3, nectar, BPPV, L inattention Restrictions Weight Bearing Restrictions: No General:   Vital Signs: Therapy Vitals Temp: (Abnormal) 97.4 F (36.3 C) Temp Source: Oral Pulse Rate: 86 Resp: 20 BP: 113/73 Patient Position (if appropriate): Lying Oxygen Therapy SpO2: 97 % O2 Device: Room Air Pain:   ADL: ADL Eating: Supervision/safety Where Assessed-Eating: Bed level Grooming: Setup Where Assessed-Grooming: [employer]  of bed Upper Body Bathing: Minimal assistance Where Assessed-Upper Body Bathing: Edge of bed, Bed level Lower Body Bathing: Moderate assistance Where Assessed-Lower Body Bathing: Edge of bed, Bed level Upper Body Dressing: Minimal assistance Where Assessed-Upper Body Dressing: Edge of bed Lower Body Dressing: Maximal assistance Where Assessed-Lower Body  Dressing: Edge of bed, Bed level Toileting: Not assessed (Pt refusing to use bathroom during session) Toilet Transfer: Not assessed Tub/Shower Transfer: Not assessed Social research officer, government: Not assessed   Therapy/Group: Individual Therapy  Janey Genta 11/14/2022, 8:17 AM

## 2022-11-14 NOTE — Progress Notes (Addendum)
PROGRESS NOTE   Subjective/Complaints:   No issues overnite , dizziness with sitting EOB with OT this am   Review of Systems  Constitutional:  Negative for chills and fever.  HENT:  Negative for congestion.   Eyes:  Negative for blurred vision.  Respiratory:  Negative for shortness of breath.   Cardiovascular:  Negative for chest pain.  Gastrointestinal:  Positive for constipation and nausea.  Genitourinary: Negative.   Neurological:  Positive for weakness. Negative for headaches.      Objective:   No results found. No results for input(s): "WBC", "HGB", "HCT", "PLT" in the last 72 hours.  Recent Labs    11/12/22 0747 11/14/22 0559  NA 138 137  K 3.8 3.6  CL 99 101  CO2 28 26  GLUCOSE 108* 107*  BUN 27* 31*  CREATININE 1.25* 1.26*  CALCIUM 9.8 9.7    No intake or output data in the 24 hours ending 11/14/22 0846       Physical Exam: Vital Signs Blood pressure 113/73, pulse 86, temperature (!) 97.4 F (36.3 C), temperature source Oral, resp. rate 20, height 5' 7.5" (1.715 m), weight 66.9 kg, SpO2 97 %.  General: No acute distress Mood and affect are appropriate Heart: Regular rate and rhythm no rubs murmurs or extra sounds Lungs: Clear to auscultation, breathing unlabored, no rales or wheezes Abdomen: Positive bowel sounds, soft nontender to palpation, nondistended Extremities: No clubbing, cyanosis, or edema Skin: No evidence of breakdown, no evidence of rash   Skin: Clean and intact without signs of breakdown Neuro:  Left central 7. Mild dysarthria. Fair insight and awareness. Needs redirection at times.  Moves left arm and leg well  4/5. ?mild right gaze preference. Sensory exam normal for light touch and pain in all 4 limbs. No limb ataxia or cerebellar signs. No abnormal tone appreciated.   Musculoskeletal: Full ROM, No pain with AROM or PROM in the neck, trunk, or extremities. Posture appropriate      Assessment/Plan: 1. Functional deficits which require 3+ hours per day of interdisciplinary therapy in a comprehensive inpatient rehab setting. Physiatrist is providing close team supervision and 24 hour management of active medical problems listed below. Physiatrist and rehab team continue to assess barriers to discharge/monitor patient progress toward functional and medical goals  Care Tool:  Bathing    Body parts bathed by patient: Right arm, Left arm, Chest, Abdomen, Front perineal area, Right upper leg, Left upper leg, Face   Body parts bathed by helper: Right lower leg, Buttocks, Left lower leg     Bathing assist Assist Level: Moderate Assistance - Patient 50 - 74%     Upper Body Dressing/Undressing Upper body dressing   What is the patient wearing?: Pull over shirt    Upper body assist Assist Level: Minimal Assistance - Patient > 75%    Lower Body Dressing/Undressing Lower body dressing      What is the patient wearing?: Pants     Lower body assist Assist for lower body dressing: Maximal Assistance - Patient 25 - 49%     Toileting Toileting Toileting Activity did not occur Landscape architect and hygiene only): Refused  Toileting assist Assist  for toileting: Dependent - Patient 0%     Transfers Chair/bed transfer  Transfers assist     Chair/bed transfer assist level: Contact Guard/Touching assist     Locomotion Ambulation   Ambulation assist   Ambulation activity did not occur: Safety/medical concerns (unable d/t increased dizziness and nausea from BPPV)  Assist level: Minimal Assistance - Patient > 75% Assistive device: Cane-straight Max distance: 15   Walk 10 feet activity   Assist  Walk 10 feet activity did not occur: Safety/medical concerns  Assist level: Minimal Assistance - Patient > 75% Assistive device: Cane-straight   Walk 50 feet activity   Assist Walk 50 feet with 2 turns activity did not occur: Safety/medical concerns  (nausea)         Walk 150 feet activity   Assist Walk 150 feet activity did not occur: Safety/medical concerns (nausea)         Walk 10 feet on uneven surface  activity   Assist Walk 10 feet on uneven surfaces activity did not occur: Safety/medical concerns (nausea)         Wheelchair     Assist Is the patient using a wheelchair?: Yes Type of Wheelchair: Manual Wheelchair activity did not occur: Safety/medical concerns (unable d/t increased dizziness and nausea from BPPV)         Wheelchair 50 feet with 2 turns activity    Assist    Wheelchair 50 feet with 2 turns activity did not occur: Safety/medical concerns       Wheelchair 150 feet activity     Assist  Wheelchair 150 feet activity did not occur: Safety/medical concerns       Blood pressure 113/73, pulse 86, temperature (!) 97.4 F (36.3 C), temperature source Oral, resp. rate 20, height 5' 7.5" (1.715 m), weight 66.9 kg, SpO2 97 %.  Medical Problem List and Plan: 1. 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 2.  Antithrombotics: -DVT/anticoagulation:  Pharmaceutical: Eliquis             -antiplatelet therapy: N/A 3. Pain Management: N/A 4. Mood/Behavior/Sleep: LCSW to follow for evaluation and support.              -antipsychotic agents: N/A 5. Neuropsych/cognition: This patient maybe capable of making decisions on her own behalf. 6. Skin/Wound Care: Routine pressure relief measures.  7. Fluids/Electrolytes/Nutrition: Monitor I/O.      Latest Ref Rng & Units 11/09/2022    5:55 AM 11/08/2022    1:46 AM 11/07/2022    1:33 AM  BMP  Glucose 70 - 99 mg/dL 118  105  118   BUN 8 - 23 mg/dL '20  17  17   '$ Creatinine 0.44 - 1.00 mg/dL 1.22  1.13  1.12   Sodium 135 - 145 mmol/L 139  139   135   Potassium 3.5 - 5.1 mmol/L 3.5  3.7  3.8   Chloride 98 - 111 mmol/L 99  103  101   CO2 22 - 32 mmol/L '28  26  25   '$ Calcium 8.9 - 10.3 mg/dL 9.8  9.2  8.8   Renal fxn stable 12/1 Recheck BMET in am    8. MVR/A fib:  Monitor HR TID--on Eliquis and Cardizem but heart rate seems to be uncontrolled.             --monitor for symptoms with increase in activity.              --plans for CV in the future.             --heart healthy diet. 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   -monitor for swallowing/tolerance of  cardizem CD   Filed Weights   11/12/22 0837 11/13/22 0500 11/14/22 0500  Weight: 65.7 kg 66.9 kg 66.9 kg  11/14 Weight a little down today    9. HTN: Monitor BP TID--on Cardizem and Lasix-->recommendations to d/c Lasix and start aldactone in am.              --lasix to be used prn overload.      Vitals:    11/08/22 2005 11/09/22 0416  BP: 117/69 115/82  Pulse: (!) 56 82  Resp: 16 16  Temp: 97.8 F (36.6 C) 97.8 F (36.6 C)  SpO2: 97% 98%    12/5 Bp well controlled, will hold spironolactone for 2 days With sitting at EOB with ACE wraps  SBP down to 21mHg , pt feels nauseated  Will reduce cardizem to '300mg'$  in am , [id number] bolus today  Po intake limited by H20 protocol  10. Leucocytosis:  Monitor for fevers and other signs of infection.             12/2 no signs at present     Latest Ref Rng & Units 11/09/2022    5:55 AM 11/08/2022    1:46 AM 11/07/2022    1:33 AM  CBC  WBC 4.0 - 10.5 K/uL 14.7  14.2  11.8   Hemoglobin 12.0 - 15.0 g/dL 13.5  13.1  11.7   Hematocrit 36.0 - 46.0 % 42.1  40.8  37.2   Platelets 150 - 400 K/uL 367  312  287     11. Acute renal failure:  SCr 0.98-->1.25-->1.13  -12/4 Scr up to 1.25 Bun 27, encourage oral fluids, hold aldactone, recheck Wednesday  12. CAP: Has completed antibiotic regimen 11/05/22.             --monitor for hypoxia/dyspnea-->denies respiratory symptoms.  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 No  symptoms?  14. Constipation   -12/4 - LBM 12/1, order sorbitol '30mg'$     LOS: 6 days A FACE TO FACE EVALUATION WAS PERFORMED  AnCharlett Blake2/05/2022, 8:46 AM

## 2022-11-14 NOTE — Progress Notes (Signed)
Physical Therapy Session Note  Patient Details  Name: Leslie [name] MRN: [id number] Date of Birth: 02-Jan-1941  Today's Date: 11/14/2022 PT Individual Time: 8264-1583 PT Individual Time Calculation (min): 50 min   and  Today's Date: 11/14/2022 PT Missed Time: 10 Minutes Missed Time Reason: Patient fatigue;Other (Comment) (orthostatic hypotension)  Short Term Goals: Week 1:  PT Short Term Goal 1 (Week 1): Pt will initiate BPPV retraining. PT Short Term Goal 2 (Week 1): Pt will perform bed mobility with overall supervision. PT Short Term Goal 3 (Week 1): Pt will initiate standing transfers with overall CGA and reduced dizziness and nausea. PT Short Term Goal 4 (Week 1): Pt will initiate gait training. PT Short Term Goal 5 (Week 1): Pt will initiate stair training.  Skilled Therapeutic Interventions/Progress Updates:    Pt received asleep, supine in bed and requires mild stimulus to awaken. Pt agreeable to therapy session. Pt currently receiving fluid bolus. Vitals assessed as described below.  Supine in bed HOB elevated 40 degrees (wearing abdominal binder, thigh high TEDs, & ACE wraps): BP 111/77 (MAP 87), HR 67bpm   Transitioned to sitting EOB, HOB elevated and using bedrail, with supervision and pt requiring significantly more time to initiate mobility with 4x cues given (with pauses between each one to allow time for processing) prior to pt coming to EOB with CGA.   Pt unable to tolerate sitting EOB for >10seconds due to reports of onset of nausea and pt spontaneously returning to supine stating she is too fatigued to sit up.  Assessed vitals once pt back in sidelying/supine: BP 91/60 (MAP 70), HR 71bpm    Pt reports need to use bathroom - due to low BP unable to safely transfer to [facility] so provided pt with bed pan. Rolling R/L in bed using bedrails with manual cuing/facilitation to roll hips completely during dependent brief management and placement of bed pan - pt noted to be  incontinent of bladder in brief and further continent (small amount) in pan. Nurse notified and present to provide cream to pt's skin during peri-care.  Pt reports she is too fatigued to try mobilizing at this time. Reassessed vitals in supine with HOB flat: BP 86/68 (MAP 76), HR 82bpm   Assisted pt with scooting towards HOB. Doffed abdominal binder, ACE wraps, and TEDs. Reassessed vitals in supine: BP 114/69 (MAP 83), HR 76bpm   Raised HOB to 43degrees: BP 104/94 (MAP 99), HR 76bpm   Due to fatigue pt unable to continue with session at this time. Pt left supported upright in bed with needs in reach, lines intact, and bed alarm on. Missed 10 minutes of skilled physical therapy.   Therapy Documentation Precautions:  Precautions Precautions: Fall Precaution Comments: dys3, nectar, BPPV, L inattention Restrictions Weight Bearing Restrictions: No   Pain: No reports of pain throughout session.    Therapy/Group: Individual Therapy  Tawana Scale , PT, DPT, NCS, CSRS 11/14/2022, 12:25 PM

## 2022-11-14 NOTE — Progress Notes (Signed)
Physical Therapy Session Note  Patient Details  Name: Leslie [name] MRN: [id number] Date of Birth: 1941/04/06  Today's Date: 11/14/2022 PT Individual Time: 1030-1120 PT Individual Time Calculation (min): 50 min   Short Term Goals: Week 1:  PT Short Term Goal 1 (Week 1): Pt will initiate BPPV retraining. PT Short Term Goal 2 (Week 1): Pt will perform bed mobility with overall supervision. PT Short Term Goal 3 (Week 1): Pt will initiate standing transfers with overall CGA and reduced dizziness and nausea. PT Short Term Goal 4 (Week 1): Pt will initiate gait training. PT Short Term Goal 5 (Week 1): Pt will initiate stair training.  Skilled Therapeutic Interventions/Progress Updates:     Patient in bed upon PT arrival. Patient alert and agreeable to PT session. Patient denied pain during session. Patient very excited that she is now cleared to drink thin water and thinks this will help her BP.   Discussed use of compezine for nausea management with mobility and obtaining abdominal binder for improved patient activity tolerance with LPN. LPN in agreement and both provided while patient sitting EOB. B thigh high TEDs and ACE wraps donned prior to and throughout session.   Orthostatic Vitals: Supine: BP 103/80, HR 88 (asymptomatic) Sitting: BP 84/65, HR 59 (mild nausea) Sitting: BP 104/86, HR 59 Standing: BP 96/62 (manual, mild symptoms) Seated after walking trial 1: BP 102/90, HR 63 (moderate symptoms, resolved <1 min of rest) Seated after walking trial 2: BP 134/73, HR 54 (asymptomatic)  Therapeutic Activity: Bed Mobility: Patient performed supine to/from sit with supervision with increased time and cues for initiation and motor planning.  Transfers: Patient performed sit to/from stand x2 with CGA using RW. Provided verbal cues for hand placement on RW and reaching back to sit.  Gait Training:  Patient ambulated 64 feet and 74 feet using RW with CGA and w/c follow for  safety/endurance. Ambulated with decreased gait speed, decreased step length and height, reduced knee extension in stance, forward trunk lean, and downward head gaze. Provided verbal cues for paced breathing for symptom management, maintaining safe proximity to RW, and continuous positive reinforcement to promote mobility. Provided seat rest break between trials, patient self-selected to walk back to the room.   Patient in recliner with B lower extremities elevated facing the window for improved patient affect at end of session with breaks locked, NT to obtain chair alarm, and all needs within reach.   Therapy Documentation Precautions:  Precautions Precautions: Fall Precaution Comments: dys3, nectar, BPPV, L inattention Restrictions Weight Bearing Restrictions: No    Therapy/Group: Individual Therapy  Shailyn Weyandt L Tyrone Pautsch PT, DPT, NCS, CBIS  11/14/2022, 12:38 PM

## 2022-11-14 NOTE — Progress Notes (Signed)
Speech Language Pathology Daily Session Note  Patient Details  Name: Leslie [name] MRN: [id number] Date of Birth: 05/23/1941  Today's Date: 11/14/2022 SLP Individual Time: 0910-1002 SLP Individual Time Calculation (min): 52 min and Today's Date: 11/14/2022 SLP Missed Time: 8 Minutes Missed Time Reason: Other (Comment) (scheduling conflict)  Short Term Goals: Week 1: SLP Short Term Goal 1 (Week 1): Pt will consume current diet with minimal overt s/sx of aspiration and min A verbal cues for implementation of safe swallowing precautions/strategies SLP Short Term Goal 2 (Week 1): Patient will recall and demonstrate use of memory compensations to increase independence and safety at home with mod A verbal cues. SLP Short Term Goal 3 (Week 1): Patient will demonstrate functional problem solving for mildly complex tasks with Mod A verbal cues. SLP Short Term Goal 4 (Week 1): Patient will demonstrate sustained attention to a functional task for 10 minutes with min A verbal cues for redirection. SLP Short Term Goal 5 (Week 1): Pt will demonstrate intellectual awareness of deficits by verbalizing cause of hospitalization (stroke) and current deficits with mod A verbal cues  Skilled Therapeutic Interventions: Skilled ST treatment focused on cognitive-linguistic and swallowing goals. Pt was passed off by OT and had just completed oral care. Pt appeared less verbose with improved attention to tasks. SLP facilitated trials of thin liquid water by cup and straw. Pt consumed small, single sips from cup with what appeared to be mildly delayed swallow initiation and no overt s/sx of aspiration and clear vocal quality post swallows. Pt with tendency to swish liquids in mouth with first 1-2 sips "to get out crumbs" (which were not visible in oral cavity). Pt consumed from straw with delayed cough x1 and appeared to exhibit less oral control. Pt continued to sip of thin liquid cup sips throughout session with no  overt s/sx of aspiration. SLP recommends advancement to thin liquids at this time (no straws) with ongoing monitoring of tolerance and respiratory status. Team Pt very pleased with this and stated she typically drinks a lot of water daily and feels it helps better control her blood pressure from being too low.   Pt actively engaged in discussion regarding current medications. Pt required mod A verbal cues for recall of med list following 5 minute delay after education. Pt stated she managed medications in a QID pillbox at PLOF. SLP facilitated an error awareness task by identifying intentional organization errors in BID pillbox. Pt required total A to identify 3/3 errors. Once error was identified, pt was able to successfully correct with min A verbal cues for problem solving.   Patient was left in bed with alarm activated and immediate needs within reach at end of session. Continue per current plan of care.      Pain  None/denied  Therapy/Group: Individual Therapy  [name] T Caleb Prigmore 11/14/2022, 9:30 AM

## 2022-11-14 NOTE — Patient Care Conference (Signed)
Inpatient RehabilitationTeam Conference and Plan of Care Update Date: 11/14/2022   Time: 10:52 AM    Patient Name: Leslie [name]      Medical Record Number: [id number]  Date of Birth: Dec 01, 1941 Sex: Female         Room/Bed: 4W21C/4W21C-01 Payor Info: Payor: [employer] MEDICARE / Plan: [place] HMO / Product Type: *No Product type* /    Admit Date/Time:  11/08/2022  5:21 PM  Primary Diagnosis:  Middle cerebral artery embolism, right  Hospital Problems: Principal Problem:   Middle cerebral artery embolism, right    Expected Discharge Date: Expected Discharge Date: 11/24/22  Team Members Present: Physician leading conference: Dr. Alysia Penna Social Worker Present: Erlene Quan, BSW Nurse Present: Dorien Chihuahua, RN PT Present: Page Spiro, PT OT Present: Other (comment) Lowella Fairy, OT) SLP Present: Sherren Kerns, SLP PPS Coordinator present : Gunnar Fusi, SLP     Current Status/Progress Goal Weekly Team Focus  Bowel/Bladder   Pt is continent/incontinent of bowel/bladder   Pt will gain continence of bowel/bladder   Will assess qshift and PRN    Swallow/Nutrition/ Hydration   mod A for consumption of dys 3 diet with thin liquids (liquids advanced 12/6)   sup A  tolerance of current diet with implementation of swallowing compensatory strategies, water protocol, thin liquid tolerance, advance diet as appropriate    ADL's   Pt limited d/t orthostatic hypotension/BPPV symptoms during session, unable to tolerate upright positon for prolonged periods of time; UB dressing and bathing set-up A, grooming min A, LB dressing and bathing mod, toileting min A, toilet and shower bench transfers squat pivot from wc min A-CGA   Supervision   BADL retraining, vestibular training, Pt education, d/c planning, sitting/standing tolerance in upright position    Mobility   significantly limited tolerance to upright/OOB activity due to symptomatic orthostatic  hypotension - supine<>sit supervision, sit<>stand and stand vs squat pivot transfers with CGA/min assist, only able to tolerate gait training 1x up to 22f with min assist   supervision overall ambulating  activity tolerance, pt/family education, bed mobility training, transfer training, BP management, standing tolerance and balance, progression to gait training    Communication   sup A   mod I   speech intelligibility    Safety/Cognition/ Behavioral Observations  max A   min A   problem solving, awareness, sustained attention, functional recall, iADLs (medication/money management)    Pain   Pt denies pain   Pt will remain pain free   Will assess qshift and PRN    Skin   Pt has MASD and rash on bottom   Pt's bottom will heal  Will assess qshift and PRN      Discharge Planning:  Discharging home with daughter and other family members to arrange supervision on a weekly basis.   Team Discussion: Patient with severe ortho stasis post MCA CVA; MD adjusted meds, ordered TEDs, abd. Binder and encouraged po fluids, IVF bolus added. Progress limited by weakness, BP issues, poor appetite, poor awareness and struggles with functional cognitive tasks, attention, awareness and problem solving covered by hyper verbosity.  Patient on target to meet rehab goals: yes, currently needs min assit for boathing and dressing. Completed squat pivots using a RW with CGA. Able to ambulate up to 15' with min assist. Advanced to D3 thin diet; mod cues. Patient is mod I for speech intelligibility but supervision for compliance with strategies.   *See Care Plan and progress notes for long and short-term goals.  Revisions to Treatment Plan:  Downgraded SLP cognition goals to min assist   Teaching Needs: Safety, medications, secondary risk management, transfers, toileting, etc  Current Barriers to Discharge: Decreased caregiver support  Possible Resolutions to Barriers: Family education HH follow  up services     Medical Summary Current Status: poor fluid intake, require IVF bolus, orthostatic hypotension, urinary incont  Barriers to Discharge: Hypotension   Possible Resolutions to Barriers/Weekly Focus: BP med adjustment fluid bolus planned for today , Advance to thin liquids per SLP today   Continued Need for Acute Rehabilitation Level of Care: The patient requires daily medical management by a physician with specialized training in physical medicine and rehabilitation for the following reasons: Direction of a multidisciplinary physical rehabilitation program to maximize functional independence : Yes Medical management of patient stability for increased activity during participation in an intensive rehabilitation regime.: Yes Analysis of laboratory values and/or radiology reports with any subsequent need for medication adjustment and/or medical intervention. : Yes   I attest that I was present, lead the team conference, and concur with the assessment and plan of the team.   Dorien Chihuahua B 11/14/2022, 3:14 PM

## 2022-11-15 DIAGNOSIS — I6601 Occlusion and stenosis of right middle cerebral artery: Secondary | ICD-10-CM | POA: Diagnosis not present

## 2022-11-15 NOTE — Progress Notes (Signed)
PROGRESS NOTE   Subjective/Complaints:   No issues overnite   Review of Systems  Constitutional:  Negative for chills and fever.  HENT:  Negative for congestion.   Eyes:  Negative for blurred vision.  Respiratory:  Negative for shortness of breath.   Cardiovascular:  Negative for chest pain.  Gastrointestinal:  Positive for constipation and nausea.  Genitourinary: Negative.   Neurological:  Positive for weakness. Negative for headaches.      Objective:   No results found. No results for input(s): "WBC", "HGB", "HCT", "PLT" in the last 72 hours.  Recent Labs    11/14/22 0559  NA 137  K 3.6  CL 101  CO2 26  GLUCOSE 107*  BUN 31*  CREATININE 1.26*  CALCIUM 9.7     Intake/Output Summary (Last 24 hours) at 11/15/2022 5027 Last data filed at 11/14/2022 1730 Gross per 24 hour  Intake 645 ml  Output --  Net 645 ml         Physical Exam: Vital Signs Blood pressure 114/75, pulse (!) 59, temperature (!) 97.5 F (36.4 C), resp. rate 16, height 5' 7.5" (1.715 m), weight 66.9 kg, SpO2 96 %.  General: No acute distress Mood and affect are appropriate Heart: Regular rate and rhythm no rubs murmurs or extra sounds Lungs: Clear to auscultation, breathing unlabored, no rales or wheezes Abdomen: Positive bowel sounds, soft nontender to palpation, nondistended Extremities: No clubbing, cyanosis, or edema Skin: No evidence of breakdown, no evidence of rash   Skin: Clean and intact without signs of breakdown Neuro:  Left central 7. Mild dysarthria. Fair insight and awareness. Needs redirection at times.  Moves left arm and leg well  4/5. Sensory exam normal for light touch and pain in all 4 limbs. No limb ataxia or cerebellar signs. No abnormal tone appreciated.   Sensation feels "a little different" to LT in LUE vs RUE  Musculoskeletal: Full ROM, No pain with AROM or PROM in the neck, trunk, or extremities. Posture  appropriate     Assessment/Plan: 1. Functional deficits which require 3+ hours per day of interdisciplinary therapy in a comprehensive inpatient rehab setting. Physiatrist is providing close team supervision and 24 hour management of active medical problems listed below. Physiatrist and rehab team continue to assess barriers to discharge/monitor patient progress toward functional and medical goals  Care Tool:  Bathing    Body parts bathed by patient: Right arm, Left arm, Chest, Abdomen, Front perineal area, Right upper leg, Left upper leg, Face   Body parts bathed by helper: Right lower leg, Buttocks, Left lower leg     Bathing assist Assist Level: Moderate Assistance - Patient 50 - 74%     Upper Body Dressing/Undressing Upper body dressing   What is the patient wearing?: Pull over shirt    Upper body assist Assist Level: Minimal Assistance - Patient > 75%    Lower Body Dressing/Undressing Lower body dressing      What is the patient wearing?: Pants     Lower body assist Assist for lower body dressing: Maximal Assistance - Patient 25 - 49%     Toileting Toileting Toileting Activity did not occur Landscape architect and  hygiene only): Refused  Toileting assist Assist for toileting: Dependent - Patient 0%     Transfers Chair/bed transfer  Transfers assist     Chair/bed transfer assist level: Contact Guard/Touching assist Chair/bed transfer assistive device: Programmer, multimedia   Ambulation assist   Ambulation activity did not occur: Safety/medical concerns (unable d/t increased dizziness and nausea from BPPV)  Assist level: Contact Guard/Touching assist Assistive device: Walker-rolling Max distance: 74 ft   Walk 10 feet activity   Assist  Walk 10 feet activity did not occur: Safety/medical concerns  Assist level: Contact Guard/Touching assist Assistive device: Walker-rolling   Walk 50 feet activity   Assist Walk 50 feet with 2 turns  activity did not occur: Safety/medical concerns (nausea)  Assist level: Contact Guard/Touching assist Assistive device: Walker-rolling    Walk 150 feet activity   Assist Walk 150 feet activity did not occur: Safety/medical concerns (nausea)         Walk 10 feet on uneven surface  activity   Assist Walk 10 feet on uneven surfaces activity did not occur: Safety/medical concerns (nausea)         Wheelchair     Assist Is the patient using a wheelchair?: Yes Type of Wheelchair: Manual Wheelchair activity did not occur: Safety/medical concerns (unable d/t increased dizziness and nausea from BPPV)         Wheelchair 50 feet with 2 turns activity    Assist    Wheelchair 50 feet with 2 turns activity did not occur: Safety/medical concerns       Wheelchair 150 feet activity     Assist  Wheelchair 150 feet activity did not occur: Safety/medical concerns       Blood pressure 114/75, pulse (!) 59, temperature (!) 97.5 F (36.4 C), resp. rate 16, height 5' 7.5" (1.715 m), weight 66.9 kg, SpO2 96 %.  Medical Problem List and Plan: 1. 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  Antithrombotics: -DVT/anticoagulation:  Pharmaceutical: Eliquis             -antiplatelet therapy: N/A 3. Pain Management: N/A 4. Mood/Behavior/Sleep: LCSW to follow for evaluation and support.              -antipsychotic agents: N/A 5. Neuropsych/cognition: This patient maybe capable of making decisions on her own behalf. 6. Skin/Wound Care: Routine pressure relief measures.  7. Fluids/Electrolytes/Nutrition: Monitor I/O.      Latest Ref Rng & Units 11/09/2022    5:55 AM 11/08/2022    1:46 AM 11/07/2022    1:33 AM  BMP  Glucose 70 - 99 mg/dL 118  105  118   BUN 8 - 23 mg/dL '20  17  17   '$ Creatinine 0.44 - 1.00 mg/dL 1.22  1.13  1.12    Sodium 135 - 145 mmol/L 139  139  135   Potassium 3.5 - 5.1 mmol/L 3.5  3.7  3.8   Chloride 98 - 111 mmol/L 99  103  101   CO2 22 - 32 mmol/L '28  26  25   '$ Calcium 8.9 - 10.3 mg/dL 9.8  9.2  8.8   Renal fxn stable 12/1 Recheck BMET in am    8. MVR/A fib: Monitor HR TID--on  Eliquis and Cardizem but heart rate seems to be uncontrolled.             --monitor for symptoms with increase in activity.              --plans for CV in the future.             --heart healthy diet. 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   -monitor for swallowing/tolerance of  cardizem CD   Filed Weights   11/12/22 0837 11/13/22 0500 11/14/22 0500  Weight: 65.7 kg 66.9 kg 66.9 kg  11/14 Weight a little down today    9. HTN: Monitor BP TID--on Cardizem and Lasix-->recommendations to d/c Lasix and start aldactone in am.              --lasix to be used prn overload.      Vitals:    11/08/22 2005 11/09/22 0416  BP: 117/69 115/82  Pulse: (!) 56 82  Resp: 16 16  Temp: 97.8 F (36.6 C) 97.8 F (36.6 C)  SpO2: 97% 98%    12/5 Bp well controlled, will hold spironolactone for 2 days With sitting at EOB with ACE wraps  SBP down to 76mHg , pt feels nauseated  Will reduce cardizem to '300mg'$  in am , 2553mfluid bolus 12/6 Po intake limited by H20 protocol  10. Leucocytosis:  Monitor for fevers and other signs of infection.             12/2 no signs at present     Latest Ref Rng & Units 11/09/2022    5:55 AM 11/08/2022    1:46 AM 11/07/2022    1:33 AM  CBC  WBC 4.0 - 10.5 K/uL 14.7  14.2  11.8   Hemoglobin 12.0 - 15.0 g/dL 13.5  13.1  11.7   Hematocrit 36.0 - 46.0 % 42.1  40.8  37.2   Platelets 150 - 400 K/uL 367  312  287     11. Acute renal failure:  SCr 0.98-->1.25-->1.13      Latest Ref Rng & Units 11/14/2022    5:59 AM 11/12/2022    7:47 AM 11/09/2022    5:55 AM  BMP  Glucose 70 - 99 mg/dL 107  108  118   BUN 8 - 23 mg/dL '31  27  20   '$ Creatinine 0.44 - 1.00 mg/dL 1.26  1.25  1.22   Sodium 135 -  145 mmol/L 137  138  139   Potassium 3.5 - 5.1 mmol/L 3.6  3.8  3.5   Chloride 98 - 111 mmol/L 101  99  99   CO2 22 - 32 mmol/L '26  28  28   '$ Calcium 8.9 - 10.3 mg/dL 9.7  9.8  9.8    Hold off on restarting aldactone, will see BP response to cardizem reduction , enc fluid, now on thin liq  12. CAP: Has completed antibiotic regimen 11/05/22.             --monitor for hypoxia/dyspnea-->denies respiratory symptoms.  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 No symptoms?  14. Constipation   -12/4 - LBM 12/1, order sorbitol '30mg'$     LOS: 7 days A FACE TO FACE EVALUATION WAS PERFORMED  AnCharlett Blake2/06/2022, 8:22 AM

## 2022-11-15 NOTE — Progress Notes (Signed)
Physical Therapy Session Note  Patient Details  Name: Leslie [name] MRN: [id number] Date of Birth: Apr 26, 1941  Today's Date: 11/15/2022 PT Individual Time: 1020-1047 PT Individual Time Calculation (min): 27 min   Short Term Goals: Week 1:  PT Short Term Goal 1 (Week 1): Pt will initiate BPPV retraining. PT Short Term Goal 2 (Week 1): Pt will perform bed mobility with overall supervision. PT Short Term Goal 3 (Week 1): Pt will initiate standing transfers with overall CGA and reduced dizziness and nausea. PT Short Term Goal 4 (Week 1): Pt will initiate gait training. PT Short Term Goal 5 (Week 1): Pt will initiate stair training.  Skilled Therapeutic Interventions/Progress Updates: Pt presents sitting in w/c in gym and handed off from PT.  Pt transfers sit to stand w/ CGA throughout session.  Pt amb 50' w/ CGA before fatiguing.  Pt sat in arm chair for BP monitoring at 106/85 (MAP 92) w/o symptoms.  Pt required seated rest breaks 2/2 fatigue.  Pt amb x 42' before increased sx of nausea and BP at 128/78 (MAP 93).  Pt returned to room and remained sitting in w/c w/ chair alarm on and all needs in reach.     Therapy Documentation Precautions:  Precautions Precautions: Fall Precaution Comments: dys3, nectar, BPPV, L inattention Restrictions Weight Bearing Restrictions: No General:   Vital Signs:  Pain:0/10 Pain Assessment Pain Scale: 0-10 Pain Score: 0-No pain Mobility:       Therapy/Group: Individual Therapy  [name] 11/15/2022, 11:27 AM

## 2022-11-15 NOTE — Progress Notes (Signed)
Recreational Therapy Assessment and Plan  Patient Details  Name: Leslie [name] MRN: [id number] Date of Birth: 01/16/1941 Today's Date: 11/15/2022  Rehab Potential:  Good ELOS:   d/c 12/16  Assessment Hospital Problem: Principal Problem:   Middle cerebral artery embolism, right     Past Medical History:      Past Medical History:  Diagnosis Date   Arthritis     Back pain due to injury      fell off a ladder and fractured back-T12 down (multiple surgeries w/ onging pain)   Chronic kidney disease      "stage 3 kidney failure" improving per pt   Headache     Hypothyroidism     Insomnia     Plantar fasciitis     Stomach ulcer ~2000   Stroke [facility])      TIA X2   2001, jan 2017   Vertigo, benign paroxysmal, bilateral     Vitamin D deficiency      Past Surgical History:       Past Surgical History:  Procedure Laterality Date   ABDOMINAL HYSTERECTOMY   1974   ANKLE FUSION Right 06/07/2016    Procedure: RIGHT TALONAVICULAR ARTHRODESIS ;  Surgeon: Wylene Simmer, MD;  Location: [place];  Service: Orthopedics;  Laterality: Right;   APPENDECTOMY       BACK SURGERY        x4  fusion T12 thru sacrum; U rod in sacrum   CATARACT EXTRACTION, BILATERAL Bilateral 2015   GASTROCNEMIUS RECESSION Right 06/07/2016    Procedure: RIGHT GASTROC RECESSION ;  Surgeon: Wylene Simmer, MD;  Location: [place];  Service: Orthopedics;  Laterality: Right;   HAND SURGERY Left 2003    x2   IR ANGIO EXTRACRAN SEL COM CAROTID INNOMINATE UNI L MOD SED   11/03/2022   IR CT HEAD LTD   11/03/2022   IR PERCUTANEOUS ART THROMBECTOMY/INFUSION INTRACRANIAL INC DIAG ANGIO   11/03/2022   L heel surgery for torn tendon Left 2015   PLANTAR FASCIA SURGERY Right 2009    surgery at [place] 11/03/2022    Procedure: IR WITH ANESTHESIA;  Surgeon: Radiologist, Medication, MD;  Location: [place];  Service: Radiology;  Laterality: N/A;   SHOULDER ARTHROSCOPY  Right 2007   TENDON REPAIR Right 1949    foot- lacerated tendon in arch of foot   THORACENTESIS N/A 10/30/2022    Procedure: THORACENTESIS;  Surgeon: Julian Hy, DO;  Location: [place];  Service: Cardiopulmonary;  Laterality: N/A;   TOTAL HIP ARTHROPLASTY Left 10/05/2021    Procedure: TOTAL HIP ARTHROPLASTY ANTERIOR APPROACH;  Surgeon: Rod Can, MD;  Location: [facility];  Service: Orthopedics;  Laterality: Left;      Assessment & Plan Clinical Impression: Patient is a 81 y.o. RH-female with history of prior CVA, CKD, GERD recent bouts with cough treated with atbx/steroids who was admitted with reports of 2-3 weeks of palpation, seen at PCP office for lightheadedness w/dizziness found to have A fib with RVR and sent to [facility] on 10/29/22 for evaluation.  She was started on IV Cardizem and Heparin. CT chest done due to episode of CP and reports of cough and revealed right layering pleural effusion and RUL PNA. She was started on Ceftriaxone and Zithromax for CAP and underwent thoracocentesis of 500 cc parapneumonic effusion by PCCM. Dr. Martinique consulted for management of A Fib and fel that new onset likely due to PNA.  2D  echo showed EF 40-45% with LV global hypokinesis with moderate asymmetric left LVH, moderate to severe MVR and dilated inferior vena cava.  She was started on metoprolol but reported SOB side effects therefore this was d/c, Cardizem titrated upwards, transitioned to Eliquis and plans for outpatient cardioversion in 3 week.    On 11/25, patient had sudden onset of right gaze deviation with left hemiparesis and left neglect. 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  Dr. Erlinda Hong felt that stroke was embolic due to A fib w/RVR even on Eliquis. Follow up MRI brain done revealing small acute infarct in right frontal, temporal lobe and caudate body with advanced chronic  small vessel disease. Dr. Curt Bears following for ongoing issues with A flutter which converted with IV amiodarone 11/25 and Cardizem titrated up to 360 mg but heart rate continues to run up to 120's at rest. MBS done revealing moderate spillage with buccal residue and delay in swallow with occasional trace aspiration. She was started on D3, nectar liquids but reports has been drinking "good" (unthickened) water to clear residue. Left sided weakness improving with increase in left attention but she continues to be limited by cognitive impairments, left sensory deficits, balance deficits and dysarthria. CIR recommended due to functional decline. Patient transferred to CIR on 11/08/2022 .    Pt presents with decreased activity tolerance, decreased functional mobility, decreased balance, decreased coordination, left inattention, decreased initiation, decreased attention, decreased awareness, decreased problem solving, decreased safety awareness, delayed processing, symptomatic for BPPV, feelings of stress Limiting pt's independence with leisure/community pursuits.  Met with pt today to discuss TR services including leisure education, activity analysis/modifications and stress management.  Also discussed the importance of social, emotional, spiritual health in addition to physical health and their effects on overall health and wellness.  Pt stated understanding.  Plan  Min 1 TR session >20 minutes during LOS  Recommendations for other services: None   Discharge Criteria: Patient will be discharged from TR if patient refuses treatment 3 consecutive times without medical reason.  If treatment goals not met, if there is a change in medical status, if patient makes no progress towards goals or if patient is discharged from hospital.  The above assessment, treatment plan, treatment alternatives and goals were discussed and mutually agreed upon: by patient  [place] 11/15/2022, 8:22 AM

## 2022-11-15 NOTE — Progress Notes (Signed)
Physical Therapy Session Note  Patient Details  Name: Leslie [name] MRN: [id number] Date of Birth: 1941/03/26  Today's Date: 11/15/2022 PT Individual Time: 0950-1020 PT Individual Time Calculation (min): 30 min   Short Term Goals: Week 1:  PT Short Term Goal 1 (Week 1): Pt will initiate BPPV retraining. PT Short Term Goal 2 (Week 1): Pt will perform bed mobility with overall supervision. PT Short Term Goal 3 (Week 1): Pt will initiate standing transfers with overall CGA and reduced dizziness and nausea. PT Short Term Goal 4 (Week 1): Pt will initiate gait training. PT Short Term Goal 5 (Week 1): Pt will initiate stair training.  Skilled Therapeutic Interventions/Progress Updates:    Pt received sitting in w/c with abdominal binder and bilateral thigh high TED hose with ACE wraps donned and pt reporting she feels good and pt looks good. Pt reports she is grateful she can now have thin water - drinks it throughout session with no signs/symptoms of aspiration. Declines taking medication for nausea at this time.  Transported to/from gym in w/c for time management and energy conservation.   Vitals sitting in w/c to start session: BP 113/58 (MAP 76), HR 63bpm   Pt noted to have perseverative grasp in L hand throughout session (grabs onto BP cord and requires increased time/cuing to release it).    Sit<>stands using RW with CGA for steadying/safety during session - pt self-corrects to push up from seat without cuing.  Gait training 14f x2 + 433f(seated break between each) using RW with CGA and +2 providing w/c follow for safety  - pt requesting to sit at specified distances due to onset of nausea symptoms - pt noticed to have some generalized  trembling while ambulating - slow gait speed although achieving reciprocal stepping pattern, downward gaze, and guarded posture. Vitals: After 1st walk: BP 105/76 (MAP 86), HR 81bpm after 1st gait when pt sitting in w/c with feet elevated After 2nd  walk: BP 114/84 (MAP 93), HR 81bpm in sitting with feet elevated After 3rd walk: BP 114/74 (MAP 85), HR 55bpm   Pt agreeable to continue with next therapy session and left seated in w/c in the care of [place]PT.     Therapy Documentation Precautions:  Precautions Precautions: Fall Precaution Comments: dys3, nectar, BPPV, L inattention Restrictions Weight Bearing Restrictions: No   Pain:  Upon questioning about pain pt states "a little bit in my right hip because I've been sitting up too long" - states she feels better after getting up and walking during session.    Therapy/Group: Individual Therapy  CaTawana Scale PT, DPT, NCS, CSRS 11/15/2022, 7:57 AM

## 2022-11-15 NOTE — Progress Notes (Signed)
Speech Language Pathology Daily Session Note  Patient Details  Name: Leslie [name] MRN: [id number] Date of Birth: 22-Mar-1941  Today's Date: 11/15/2022 SLP Individual Time: 1110-1210 SLP Individual Time Calculation (min): 60 min  Short Term Goals: Week 1: SLP Short Term Goal 1 (Week 1): Pt will consume current diet with minimal overt s/sx of aspiration and min A verbal cues for implementation of safe swallowing precautions/strategies SLP Short Term Goal 2 (Week 1): Patient will recall and demonstrate use of memory compensations to increase independence and safety at home with mod A verbal cues. SLP Short Term Goal 3 (Week 1): Patient will demonstrate functional problem solving for mildly complex tasks with Mod A verbal cues. SLP Short Term Goal 4 (Week 1): Patient will demonstrate sustained attention to a functional task for 10 minutes with min A verbal cues for redirection. SLP Short Term Goal 5 (Week 1): Pt will demonstrate intellectual awareness of deficits by verbalizing cause of hospitalization (stroke) and current deficits with mod A verbal cues  Skilled Therapeutic Interventions: Skilled ST treatment focused on cognitive-linguistic goals. Pt was greeted upright in wheelchair upon arrival and reported she has had a good day today. Pt was observed consuming thin water by cup without overt s/sx of aspiration and clear vocal quality post swallows. Pt denied any coughing incidents since liquid advancement to thin liquids which occurred yesterday. Pt reported she already feels more hydrated having been able to consume (thin) water during meals. SLP facilitated attention and memory goals using the BITS during a trail making task with max A verbal/visual cues, and sequential memory task with mod A verbal/visual cues for improved accuracy and implementation of internal memory strategies (repetition). Pt was able to sustain attention to today's tasks for 5 minute intervals with minimal verbal  redirection. Patient was left in wheelchair with alarm activated and immediate needs within reach at end of session. Continue per current plan of care.      Pain  None/denied   Therapy/Group: Individual Therapy  Lataria Courser T Jari Carollo 11/15/2022, 4:30 PM

## 2022-11-15 NOTE — Progress Notes (Signed)
Occupational Therapy Weekly Progress Note  Patient Details  Name: Leslie [name] MRN: [id number] Date of Birth: Oct 04, 1941  Beginning of progress report period: November 09, 2022 End of progress report period: November 15, 2022  Today's Date: 11/15/2022 OT Individual Time: 0803-0900 OT Individual Time Calculation (min): 57 min  OT Individual Time: 1400-1430 OT Individual Time Calculation (min): 30 min    Patient has met 3 of 4 short term goals.  Pt is demonstrating increased independence in completing UB dressing EOB set-up assist and LB CGA when bringing pants to her waist. Pt is also demonstrating increased attention to her L visual field and tolerating standing for 20-30 seconds. Pt able  Patient continues to demonstrate the following deficits: muscle weakness, decreased cardiorespiratoy endurance, decreased attention to left, decreased attention, decreased awareness, decreased problem solving, decreased safety awareness, decreased memory, and delayed processing, central origin, and decreased standing balance and therefore will continue to benefit from skilled OT intervention to enhance overall performance with BADL, iADL, and Reduce care partner burden.  Patient progressing toward long term goals..  Continue plan of care.  OT Short Term Goals Week 1:  OT Short Term Goal 1 (Week 1): Pt will attend to L visual field/side of body during functional activities with min A OT Short Term Goal 1 - Progress (Week 1): Met OT Short Term Goal 2 (Week 1): Pt will tolerate standing using LRAD during functional activties/BADLs for 3 minutes OT Short Term Goal 2 - Progress (Week 1): Progressing toward goal OT Short Term Goal 3 (Week 1): Pt will complete toilet transfer CGA using LRAD OT Short Term Goal 3 - Progress (Week 1): Met OT Short Term Goal 4 (Week 1): Pt will complete LB dressing min A OT Short Term Goal 4 - Progress (Week 1): Met Week 2:  OT Short Term Goal 1 (Week 2): STG=LTG (d/t pt  lenght of stay)  Skilled Therapeutic Interventions/Progress Updates:     AM Session: Pt received sitting up in bed eating breakfast under the direct supervision of nursing staff. Pt reporting she feels slightly nauseous and is having 3/10 pain in her hip d/t sitting in bed. Pt agreeable to skilled OT session.   Vitals taken sitting semi-reclined in bed: BP 76/60 (MAP 60) HR 56  TEDs and ACE wrap donned with Pt sitting up in bed. Pt instructed to complete ankle pumps prior to mobilization. MD in/out for morning rounds. Rn in/out for medication management. OT and Rn collaboration for Pt positioning in bed to take medication and for education on importance taking daily medications. Pt requiring increased time and mod verbal cues to swallow and continue taking medications.   Pt transitioned from sitting up in bed to EOB with increased time with HOB elevated close supervision.  Vitals EOB: BP 98/57 (MAP 70) HR 96, Pt reporting slight increase in nausea symptoms. Pt instructed to complete toe/heel lifts sitting EOB. Pt tolerating sitting EOB better than previous session. Pt doffed shirt EOB with moderate verbal cues for redirection to task +time. Pt completed UB bathing and donned shirt supervision +time and mod cues for redirection d/t decreased attention.   Abdominal binder donned.   Vitals taken ~10 sitting EOB: BP 98/78 (MAP 86) HR 121 Pt completed stand pivot transfer to wc using RW CGA mod cues for body mechanics, technique, and safety.   Pt completed grooming and oral hygiene tasks seated at sink with supervision, mod cues for problem solving +time.  Vitals taken after sitting in wc ~5 minutes:  BP 97/72 (81) HR 67  Pt handed off to TR in room with all immediate needs met.   PM Session:   Pt received resting in wc in good spirits and agreeable to skilled OT session. Pt reporting 0/10 pain upon OT arrival. Pt reporting she would like to try to walk during session this PM. Abdominal binder,  TEDs, and ACE wraps donned on Pt prior to OT arrival.   Vitals taken in sitting: BP 105/74 (MAP 81)  Pt attempted to stand using RW min A and unable to maintain upright position >10 seconds d/t nausea symptoms. Pt provided therapeutic support and encouragement. Pt provided education on symptoms and instructed to complete ankle pumps prior to initiating another stand. Vitals taken when returning to sitting ~30 seconds: BP 111/67 (74)  Pt reported she would like to attempt walking short distance again. Pt completed sit>stand using RW with mod cues for body positioning and safety. Pt ambulated ~15 feet using RW CGA mod cues to lift head during task with +2 wc follow. Pt noticeable fatiguing and provided wc to sit.  Vitals taken following ~41f  walk using RW: BP 109/75 (MAP 86)   Pt transported from hallway back to room total A in wc. Pt discouraged following walk d/t expecting to be able to walk further during task. Pt provided increased therapeutic support and encouragement with noted improvement in moral.  Vitals taken returning to sitting ~2 minutes: 101/75 (MAP 82)  Pt was left resting in wc with call bell in reach, chair alarm on, and all needs met.   Therapy Documentation Precautions:  Precautions Precautions: Fall Precaution Comments: dys3, nectar, BPPV, L inattention Restrictions Weight Bearing Restrictions: No General:    Vital Signs: Therapy Vitals Temp: (Abnormal) 97.5 F (36.4 C) Pulse Rate: (Abnormal) 59 Resp: 16 BP: 114/75 Patient Position (if appropriate): Lying Oxygen Therapy SpO2: 96 % O2 Device: Room Air   ADL: ADL Eating: Supervision/safety Where Assessed-Eating: Bed level Grooming: Setup Where Assessed-Grooming: Edge of bed Upper Body Bathing: Minimal assistance Where Assessed-Upper Body Bathing: Edge of bed, Bed level Lower Body Bathing: Moderate assistance Where Assessed-Lower Body Bathing: Edge of bed, Bed level Upper Body Dressing: Minimal  assistance Where Assessed-Upper Body Dressing: Edge of bed Lower Body Dressing: Maximal assistance Where Assessed-Lower Body Dressing: Edge of bed, Bed level Toileting: Not assessed (Pt refusing to use bathroom during session) Toilet Transfer: Not assessed Tub/Shower Transfer: Not assessed WSocial research officer, government Not assessed Vision   Perception    Praxis   Exercises:   Other Treatments:     Therapy/Group: Individual Therapy  LJaney Genta12/06/2022, 8:19 AM

## 2022-11-15 NOTE — Progress Notes (Signed)
Patient ID: Leslie [name], female   DOB: [birth year], 81 y.o.   MRN: [id number]  Team Conference Report to Patient/Family  Team Conference discussion was reviewed with the patient and caregiver, including goals, any changes in plan of care and target discharge date.  Patient and caregiver express understanding and are in agreement.  The patient has a target discharge date of 11/24/22.  SW met with patient and spoke with daughter via telephone to provide team conference updates. Daughter concerned about patient's BP and her ambulating at d/c. Sw will follow up with therapy team on progress today. Daughter will be present for family education 12/15 9-12.   Dyanne Iha 11/15/2022, 12:11 PM

## 2022-11-16 DIAGNOSIS — I6601 Occlusion and stenosis of right middle cerebral artery: Secondary | ICD-10-CM | POA: Diagnosis not present

## 2022-11-16 NOTE — Progress Notes (Signed)
Physical Therapy Session Note  Patient Details  Name: Leslie [name] MRN: [id number] Date of Birth: [birth year]  Today's Date: 11/16/2022 PT Individual Time: 0916-1000 PT Individual Time Calculation (min): 44 min   Short Term Goals: Week 1:  PT Short Term Goal 1 (Week 1): Pt will initiate BPPV retraining. PT Short Term Goal 2 (Week 1): Pt will perform bed mobility with overall supervision. PT Short Term Goal 3 (Week 1): Pt will initiate standing transfers with overall CGA and reduced dizziness and nausea. PT Short Term Goal 4 (Week 1): Pt will initiate gait training. PT Short Term Goal 5 (Week 1): Pt will initiate stair training.  Skilled Therapeutic Interventions/Progress Updates: Pt handed off from nurse, sitting in w/c at sink brushing teeth.  Pt given towel for face and pt w/ wet shirt.  Pt assisted to thread scrub pants over feet and then transferred sit to stand w/ CGA and steadying for pt to complete donning of pants over hips.  Pt doffed wet t-shirt w/ mod A as pt pulls arm out of sleeve and attempt to put through neck hole.  Pt given scrub top and dons w/ supervision and cueing to pull all the way down in back.  Abd binder re-applied for compression.  Pt wheeled to dayroom for energy and time conservation.  BP monitored at 103/66 sitting and then pt transferred sit to stand w/ CGA.  Pt unable to amb, c/o nausea and returned to sitting.  BP at 95/62, HR at 112 bpm.  Nausea disippates w/ sitting x 2-3 min and BP at 103/60.  Pt transfers sit to stand w/ CGA and amb 15' w/ c/o nausea, BP 72/56.  Pt returned to room w/ nausea decreasing, BP at conclusion of session 128/65.  Nursing notified of change in BP  Chair alarm on and all needs in reach.     Therapy Documentation Precautions:  Precautions Precautions: Fall Precaution Comments: dys3, nectar, BPPV, L inattention Restrictions Weight Bearing Restrictions: No General:   Vital Signs:   Pain:0/10 Pain Assessment Pain Scale:  0-10 Pain Score: 0-No pain Mobility:       Therapy/Group: Individual Therapy  [name] 11/16/2022, 10:03 AM

## 2022-11-16 NOTE — Progress Notes (Signed)
Recreational Therapy Session Note  Patient Details  Name: Georgeann Brinkman MRN: [id number] Date of Birth: 01-01-1941 Today's Date: 11/16/2022  Pain: no c/o Skilled Therapeutic Interventions/Progress Updates: Pt will participate in simple lesiure task seated with supervision.  Session focused on activity tolerance during co-treat with OT.  Session included use of music, seated dance and mobility tasks.  Pt enjoyed this session and sharing previous dance related experiences with staff.  PT did report feeling sleepy, BP 95/49.  LEs elevated and pt instructed to perform ankle pumps.  BP recovered to 117/64 and continued with dance related tasks.  Pt smiling, joking and laughing throughout this session.  Therapy/Group: Co-Treatment  Advik Weatherspoon 11/16/2022, 1:07 PM

## 2022-11-16 NOTE — Plan of Care (Signed)
Goals downgraded due to slower than anticipated progress Problem: RH Problem Solving Goal: LTG Patient will demonstrate problem solving for (SLP) Description: LTG:  Patient will demonstrate problem solving for basic/complex daily situations with cues  (SLP) Flowsheets (Taken 11/16/2022 1628) LTG: Patient will demonstrate problem solving for (SLP): Basic daily situations LTG Patient will demonstrate problem solving for: Moderate Assistance - Patient 50 - 74%   Problem: RH Memory Goal: LTG Patient will demonstrate ability for day to day (SLP) Description: LTG:   Patient will demonstrate ability for day to day recall/carryover during cognitive/linguistic activities with assist  (SLP) Flowsheets (Taken 11/16/2022 1628) LTG: Patient will demonstrate ability for day to day recall/carryover during cognitive/linguistic activities with assist (SLP): Moderate Assistance - Patient 50 - 74% Goal: LTG Patient will use memory compensatory aids to (SLP) Description: LTG:  Patient will use memory compensatory aids to recall biographical/new, daily complex information with cues (SLP) Flowsheets (Taken 11/16/2022 1628) LTG: Patient will use memory compensatory aids to (SLP): Minimal Assistance - Patient > 75%   Problem: RH Attention Goal: LTG Patient will demonstrate this level of attention during functional activites (SLP) Description: LTG:  Patient will will demonstrate this level of attention during functional activites (SLP) Flowsheets (Taken 11/16/2022 1628) Patient will demonstrate this level of attention during cognitive/linguistic activities in: Controlled   Problem: RH Swallowing Goal: LTG Patient will consume least restrictive diet using compensatory strategies with assistance (SLP) Description: LTG:  Patient will consume least restrictive diet using compensatory strategies with assistance (SLP) Flowsheets (Taken 11/16/2022 1628) LTG: Pt Patient will consume least restrictive diet using compensatory  strategies with assistance of (SLP): Minimal Assistance - Patient > 75% Goal: LTG Patient will participate in dysphagia therapy to increase swallow function with assistance (SLP) Description: LTG:  Patient will participate in dysphagia therapy to increase swallow function with assistance (SLP) Flowsheets (Taken 11/16/2022 1628) LTG: Pt will participate in dysphagia therapy to increase swallow function with assistance of (SLP): Minimal Assistance - Patient > 75%

## 2022-11-16 NOTE — Plan of Care (Signed)
  Problem: RH Expression Communication Goal: LTG Patient will increase speech intelligibility (SLP) Description: LTG: Patient will increase speech intelligibility at word/phrase/conversation level with cues, % of the time (SLP) Outcome: Completed/Met   

## 2022-11-16 NOTE — Progress Notes (Signed)
Occupational Therapy Session Note  Patient Details  Name: Leslie [name] MRN: [id number] Date of Birth: [birth year]  Today's Date: 11/16/2022 OT Individual Time: 0800-0900 OT Individual Time Calculation (min): 60 min  OT Individual Time: 1660-6301 OT Individual Time Calculation (min): 60 min   Short Term Goals: Week 2:  OT Short Term Goal 1 (Week 2): STG=LTG (d/t pt lenght of stay)  Skilled Therapeutic Interventions/Progress Updates:     Pt received in room semi-reclined in bed beginning to eat breakfast with nursing staff present in room. Pt handed off the OT. Pt reporting 0/10 pain and no symptoms of nausea/dizziness while reclined in bed. TED hose and ACE wraps donned on BLEs. Pt transitioned from sitting up in bed to EOB supervision using bed features (bed rails) and increased time. Abdominal binder donned with Pt sitting EOB. Focus this session focused attention, transfer training, and BADL retraining.   Pt positioned EOB with food on tray table in front of Pt in quiet environment with door shut. Pt instructed to take small bites of food and not speak while eating to prevent aspiration. Pt able to maintain focus on food 20-30 seconds without speaking requiring moderate redirection to task and mod verbal cues to swallow food prior to speaking. Pt provided water on table and perseverating on telling OT how much she loves water during each sip. While eating breakfast, Pt requesting to use restroom.  Vitals taken with Pt EOB: BP 110/84 (MAP 84)  Pt completed squat pivot transfer to wc without AD CGA and transported to bathroom total A. Pt completed stand pivot transfer using RW to standard toilet. Pt noted to have soiled brief on. Pt completed 3/3 toileting tasks with small continent void in toilet. Pt completed sit>stand to bring pants to waist using RW CGA for blance and mod verbal cues for safety, body mechanics, and hand positioning. Pt was able to maintain standing using RW CGA for ~1.5  minutes to allow OT to collect vitals with increase in nausea symptoms reported.   Vitals taken in standing BP 120/104 (MAP 111).  Pt completed stand step transfer back to wc using RW CGA.   Pt transported to sink to wash hands seated in wc. Pt required min direct verbal cues to initiate turning on water to wash hands and mod questioning cues to initiate drying hands and throwing away paper towel.   Pt positioned in wc with breakfast on tray table to finish eating with nursing staff present in room. Pt was left resting in wc with call bell in reach, chair alarm on, and all immediate needs met.   Session 2: Pt received resting in wc greeted and receptive to skilled OT session. Pt reporting she would like to take a nap, but willing to wait and reporting 0/10 pain. Pt stated she was not having much hip pain since she had been out of the bed.   Pt requesting water at beginning of session and attempting to drink while speaking to OT. Pt provided education on swallowing water prior to speaking to prevent aspiration and leakage from mouth. Pt continuing to require min-mod verbal cues during session to attend to swallowing when drinking water.   Pt negatively speaking concerning her medical condition and current orthostatic hypotension. Pt also worried about DTR being concerned about her health status. Pt provided education on orthostatic treatment and recovery process. Therapeutic use of self utilized to provide support, motivation, and encouragement to Pt with improvement in moral noted following conversation.   Pt  offered music to help improve mood and motivation for therapy session with TR present in room. Pt BP measured with Pt in wc d/t Pt stating she was feeling increasingly sleepy: BP 95/49 (MAP 84). Pt instructed to complete ankle pumps and BLEs elevated to improve blood flow. Vitals taken again with Pt in sitting recovering to BP 117/64 (MAP 79) with light LE ankle pump exercises and feet elevated.  Pt participated in BU/LE seated shag dancing activities to improve moral, strength, and endurance required for BADL and IADL tasks. Pt able to complete reciprocal twisting, reaching, and kicking movements with increased time for motor planning and visually processing demonstration from OT. Pt noted to smile and laugh while completing dance activities requiring mod redirection to attend to task. Pt self-limiting during activity frequently spontaneously sitting back in wc during activity to take "rest breaks". Pt provided increased therapeutic listening and motivation throughout session.   Pt requested to return to bed at end of session. Pt completed squat pivot transfer from wc to EOB with HOB elevated CGA and mod cues for body mechanics. Pt was left resting in bed with call bell in reach, bed alarm on, and all needs met.    Therapy Documentation Precautions:  Precautions Precautions: Fall Precaution Comments: dys3, nectar, BPPV, L inattention Restrictions Weight Bearing Restrictions: No   ADL: ADL Eating: Supervision/safety Where Assessed-Eating: Bed level Grooming: Setup Where Assessed-Grooming: Sitting at sink Upper Body Bathing: Minimal cueing, Setup Where Assessed-Upper Body Bathing: Edge of bed Lower Body Bathing: Minimal assistance Where Assessed-Lower Body Bathing: Edge of bed Upper Body Dressing: Supervision/safety, Minimal cueing Where Assessed-Upper Body Dressing: Edge of bed Lower Body Dressing: Contact guard Where Assessed-Lower Body Dressing: Edge of bed Toileting: Minimal assistance, Contact guard Where Assessed-Toileting: Glass blower/designer: Therapist, music Method: Engineer, water: Energy manager: Not assessed Social research officer, government: Not assessed   Therapy/Group: Individual Therapy  Janey Genta 11/16/2022, 12:32 PM

## 2022-11-16 NOTE — Progress Notes (Signed)
Speech Language Pathology Weekly Progress and Session Note  Patient Details  Name: Leslie [name] MRN: [id number] Date of Birth: 25-Jan-1941  Beginning of progress report period: November 09, 2022 End of progress report period: November 16, 2022  Today's Date: 11/16/2022 SLP Individual Time: 1330-1415 SLP Individual Time Calculation (min): 45 min  Short Term Goals: Week 1: SLP Short Term Goal 1 (Week 1): Pt will consume current diet with minimal overt s/sx of aspiration and min A verbal cues for implementation of safe swallowing precautions/strategies SLP Short Term Goal 1 - Progress (Week 1): Not met SLP Short Term Goal 2 (Week 1): Patient will recall and demonstrate use of memory compensations to increase independence and safety at home with mod A verbal cues. SLP Short Term Goal 2 - Progress (Week 1): Met SLP Short Term Goal 3 (Week 1): Patient will demonstrate functional problem solving for mildly complex tasks with Mod A verbal cues. SLP Short Term Goal 3 - Progress (Week 1): Not met SLP Short Term Goal 4 (Week 1): Patient will demonstrate sustained attention to a functional task for 10 minutes with min A verbal cues for redirection. SLP Short Term Goal 4 - Progress (Week 1): Not met SLP Short Term Goal 5 (Week 1): Pt will demonstrate intellectual awareness of deficits by verbalizing cause of hospitalization (stroke) and current deficits with mod A verbal cues SLP Short Term Goal 5 - Progress (Week 1): Met  New Short Term Goals: Week 2: SLP Short Term Goal 1 (Week 2): STG=LTG due to ELOS  Weekly Progress Updates: Patient has made slow gains and has met 2 of 5 STGs this reporting period. Patient is currently completing basic level cognitive tasks with overall mod-to-max A verbal cues in regards to sustained attention, memory, problem solving, and awareness. Pt is limited by impaired attention skills requiring frequent verbal redirection. Question pt's cognitive baseline. Family has  not been present during treatment to discuss. Pt has demonstrated improved oropharyngeal swallow function and was able to advance to thin liquids with minimal overt s/sx of aspiration. Pt is consuming a dysphagia 3 diet with thin liquids with mod A verbal cues to implement safe swallowing precautions and strategies. Pt with limited awareness of anterior spillage and left pocketing and limited carry over. LTG's were downgraded to a min-to-mod A level due to slower than anticipated progress. Education is ongoing. Patient would benefit from continued skilled SLP intervention to maximize cognitive functioning, swallowing, and overall functional independence prior to discharge. Recommend 24/7 supervision and assistance at time of discharge and follow up SLP services.    Intensity: Minumum of 1-2 x/day, 30 to 90 minutes Frequency: 3 to 5 out of 7 days Duration/Length of Stay: 12/16 Treatment/Interventions: Cognitive remediation/compensation;Internal/external aids;Speech/Language facilitation;Dysphagia/aspiration precaution training;Functional tasks;Patient/family education;Therapeutic Activities  Daily Session Skilled Therapeutic Interventions: Skilled ST treatment focused on cognitive and swallowing goals. Pt was received in bed on arrival with evident oral residuals spilling from oral cavity from noon meal. Pt with minimal awareness of this. SLP facilitated oral care with sup A for thoroughness. SLP provided re-education regarding use of lingual sweep and utilizing liquids to rinse mouth throughout and after meal. Pt verbalized understanding through teach back but suspect decreased carry over d/t cognitive impairments. Continue to recommend full supervision during meals to reinforce implementation of strategies.   SLP facilitated a working memory and sustained attention task with 66% accuracy for immediate recall, and 33% accurate following 5 minute delay with mod A cues, progressing to 53% accurate with max  A  verbal cues. Pt required mod-to-max A verbal redirection cues throughout session for topic maintenance due to hyperverbal nature. Pt continues to share the same stories with SLP that have been discussed 6-8+ times now without recall that these have been frequent topics of discussion. Pt exhibited improved intellectual awareness of her stroke and how she will require support at home for her overall safety and wellbeing. Continues to require min-to-mod A for intellectual awareness; max A for emergent awareness. Patient was left in bed with alarm activated and immediate needs within reach at end of session. Continue per current plan of care.     General    Pain None/denied  Therapy/Group: Individual Therapy  Patty Sermons 11/16/2022, 4:37 PM

## 2022-11-16 NOTE — Progress Notes (Signed)
Physical Therapy Session Note  Patient Details  Name: Leslie [name] MRN: [id number] Date of Birth: 18-Aug-1941  Today's Date: 11/16/2022 PT Individual Time: 0076-2263 PT Individual Time Calculation (min): 28 min   Short Term Goals: Week 1:  PT Short Term Goal 1 (Week 1): Pt will initiate BPPV retraining. PT Short Term Goal 2 (Week 1): Pt will perform bed mobility with overall supervision. PT Short Term Goal 3 (Week 1): Pt will initiate standing transfers with overall CGA and reduced dizziness and nausea. PT Short Term Goal 4 (Week 1): Pt will initiate gait training. PT Short Term Goal 5 (Week 1): Pt will initiate stair training. Week 2:    Week 3:    Week 4:     Skilled Therapeutic Interventions/Progress Updates:   Pt received supine in bed and agreeable to PT. Supine>sit transfer with *** assist and ***cues   Stand pivot transfer.   Sit<>stand transfer with supervision assist to asses VS x 4. Ptunable to remain standing for >10 sec due to nausea 2/2 low BP.   Pt returned to room and performed ** transfer to bed with **. Sit>supine completed with ** and left supine in bed with call bell in reach and all needs met.       Therapy Documentation Precautions:  Precautions Precautions: Fall Precaution Comments: dys3, nectar, BPPV, L inattention Restrictions Weight Bearing Restrictions: No General:   Vital Signs:   Pain: Pain Assessment Pain Scale: 0-10 Pain Score: 3  Pain Type: Acute pain Pain Location: Hip Pain Orientation: Right Pain Descriptors / Indicators: Aching Mobility:   Locomotion :    Trunk/Postural Assessment :    Balance:   Exercises:   Other Treatments:      Therapy/Group: Individual Therapy  [name] 11/16/2022, 5:46 PM

## 2022-11-16 NOTE — Progress Notes (Signed)
PROGRESS NOTE   Subjective/Complaints:   BP improved yesterday , gets nauseated when it drops, was able to amb with PT , with sys BP >190mHg, using ACE wraps and abd binder   Review of Systems  Constitutional:  Negative for chills and fever.  HENT:  Negative for congestion.   Eyes:  Negative for blurred vision.  Respiratory:  Negative for shortness of breath.   Cardiovascular:  Negative for chest pain.  Gastrointestinal:  Positive for constipation and nausea.  Genitourinary: Negative.   Neurological:  Positive for weakness. Negative for headaches.      Objective:   No results found. No results for input(s): "WBC", "HGB", "HCT", "PLT" in the last 72 hours.  Recent Labs    11/14/22 0559  NA 137  K 3.6  CL 101  CO2 26  GLUCOSE 107*  BUN 31*  CREATININE 1.26*  CALCIUM 9.7     Intake/Output Summary (Last 24 hours) at 11/16/2022 0657 Last data filed at 11/15/2022 0900 Gross per 24 hour  Intake 240 ml  Output --  Net 240 ml         Physical Exam: Vital Signs Blood pressure 117/77, pulse 92, temperature 98 F (36.7 C), temperature source Oral, resp. rate 16, height 5' 7.5" (1.715 m), weight 67.8 kg, SpO2 96 %.  General: No acute distress Mood and affect are appropriate Heart: Regular rate and rhythm no rubs murmurs or extra sounds Lungs: Clear to auscultation, breathing unlabored, no rales or wheezes Abdomen: Positive bowel sounds, soft nontender to palpation, nondistended Extremities: No clubbing, cyanosis, or edema Skin: No evidence of breakdown, no evidence of rash   Skin: Clean and intact without signs of breakdown Neuro:  Left central 7. Mild dysarthria. Fair insight and awareness. Needs redirection at times.  Moves left arm and leg well  4/5. Sensory exam normal for light touch and pain in all 4 limbs. No limb ataxia or cerebellar signs. No abnormal tone appreciated.   Sensation feels "a little  different" to LT in LUE vs RUE  Musculoskeletal: Full ROM, No pain with AROM or PROM in the neck, trunk, or extremities. Posture appropriate     Assessment/Plan: 1. Functional deficits which require 3+ hours per day of interdisciplinary therapy in a comprehensive inpatient rehab setting. Physiatrist is providing close team supervision and 24 hour management of active medical problems listed below. Physiatrist and rehab team continue to assess barriers to discharge/monitor patient progress toward functional and medical goals  Care Tool:  Bathing    Body parts bathed by patient: Right arm, Left arm, Chest, Abdomen, Front perineal area, Right upper leg, Left upper leg, Face   Body parts bathed by helper: Right lower leg, Buttocks, Left lower leg     Bathing assist Assist Level: Moderate Assistance - Patient 50 - 74%     Upper Body Dressing/Undressing Upper body dressing   What is the patient wearing?: Pull over shirt    Upper body assist Assist Level: Minimal Assistance - Patient > 75%    Lower Body Dressing/Undressing Lower body dressing      What is the patient wearing?: Pants     Lower body assist Assist for  lower body dressing: Maximal Assistance - Patient 25 - 49%     Toileting Toileting Toileting Activity did not occur Landscape architect and hygiene only): Refused  Toileting assist Assist for toileting: Dependent - Patient 0%     Transfers Chair/bed transfer  Transfers assist     Chair/bed transfer assist level: Contact Guard/Touching assist Chair/bed transfer assistive device: Programmer, multimedia   Ambulation assist   Ambulation activity did not occur: Safety/medical concerns (unable d/t increased dizziness and nausea from BPPV)  Assist level: Contact Guard/Touching assist Assistive device: Walker-rolling Max distance: 50   Walk 10 feet activity   Assist  Walk 10 feet activity did not occur: Safety/medical concerns  Assist level:  Contact Guard/Touching assist Assistive device: Walker-rolling   Walk 50 feet activity   Assist Walk 50 feet with 2 turns activity did not occur: Safety/medical concerns (nausea)  Assist level: Contact Guard/Touching assist Assistive device: Walker-rolling    Walk 150 feet activity   Assist Walk 150 feet activity did not occur: Safety/medical concerns (nausea)         Walk 10 feet on uneven surface  activity   Assist Walk 10 feet on uneven surfaces activity did not occur: Safety/medical concerns (nausea)         Wheelchair     Assist Is the patient using a wheelchair?: Yes Type of Wheelchair: Manual Wheelchair activity did not occur: Safety/medical concerns (unable d/t increased dizziness and nausea from BPPV)         Wheelchair 50 feet with 2 turns activity    Assist    Wheelchair 50 feet with 2 turns activity did not occur: Safety/medical concerns       Wheelchair 150 feet activity     Assist  Wheelchair 150 feet activity did not occur: Safety/medical concerns       Blood pressure 117/77, pulse 92, temperature 98 F (36.7 C), temperature source Oral, resp. rate 16, height 5' 7.5" (1.715 m), weight 67.8 kg, SpO2 96 %.  Medical Problem List and Plan: 1. 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  Antithrombotics: -DVT/anticoagulation:  Pharmaceutical: Eliquis             -antiplatelet therapy: N/A 3. Pain Management: N/A 4. Mood/Behavior/Sleep: LCSW to follow for evaluation and support.              -antipsychotic agents: N/A 5. Neuropsych/cognition: This patient maybe capable of making decisions on her own behalf. 6. Skin/Wound Care: Routine pressure relief measures.  7. Fluids/Electrolytes/Nutrition: Monitor I/O.      Latest Ref Rng & Units 11/09/2022    5:55 AM 11/08/2022    1:46 AM  11/07/2022    1:33 AM  BMP  Glucose 70 - 99 mg/dL 118  105  118   BUN 8 - 23 mg/dL '20  17  17   '$ Creatinine 0.44 - 1.00 mg/dL 1.22  1.13  1.12   Sodium 135 - 145 mmol/L 139  139  135   Potassium 3.5 - 5.1 mmol/L 3.5  3.7  3.8   Chloride 98 - 111 mmol/L 99  103  101   CO2 22 - 32 mmol/L '28  26  25   '$ Calcium 8.9 - 10.3 mg/dL  9.8  9.2  8.8   Renal fxn stable 12/1 Recheck BMET in am    8. MVR/A fib: Monitor HR TID--on Eliquis and Cardizem but heart rate seems to be uncontrolled.             --monitor for symptoms with increase in activity.              --plans for CV in the future.             --heart healthy diet. 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   -monitor for swallowing/tolerance of  cardizem CD   Filed Weights   11/13/22 0500 11/14/22 0500 11/16/22 0318  Weight: 66.9 kg 66.9 kg 67.8 kg  11/14 Weight a little down today    9. HTN: Monitor BP TID--on Cardizem and Lasix-->recommendations to d/c Lasix and start aldactone in am.              --lasix to be used prn overload.      Vitals:    11/08/22 2005 11/09/22 0416  BP: 117/69 115/82  Pulse: (!) 56 82  Resp: 16 16  Temp: 97.8 F (36.6 C) 97.8 F (36.6 C)  SpO2: 97% 98%    12/5 Bp well controlled, will hold spironolactone  With sitting at EOB with ACE wraps  SBP down to 32mHg , pt feels nauseated  Will reduce cardizem to '300mg'$  in am , 2568mfluid bolus 12/6 Po intake limited by H20 protocol  Was up with PT yesterday sys BP remained above 1003m 10. Leucocytosis:  Monitor for fevers and other signs of infection.             12/2 no signs at present     Latest Ref Rng & Units 11/09/2022    5:55 AM 11/08/2022    1:46 AM 11/07/2022    1:33 AM  CBC  WBC 4.0 - 10.5 K/uL 14.7  14.2  11.8   Hemoglobin 12.0 - 15.0 g/dL 13.5  13.1  11.7   Hematocrit 36.0 - 46.0 % 42.1  40.8  37.2   Platelets 150 - 400 K/uL 367  312  287     11. Acute renal failure:  SCr 0.98-->1.25-->1.13      Latest Ref Rng & Units 11/14/2022     5:59 AM 11/12/2022    7:47 AM 11/09/2022    5:55 AM  BMP  Glucose 70 - 99 mg/dL 107  108  118   BUN 8 - 23 mg/dL '31  27  20   '$ Creatinine 0.44 - 1.00 mg/dL 1.26  1.25  1.22   Sodium 135 - 145 mmol/L 137  138  139   Potassium 3.5 - 5.1 mmol/L 3.6  3.8  3.5   Chloride 98 - 111 mmol/L 101  99  99   CO2 22 - 32 mmol/L '26  28  28   '$ Calcium 8.9 - 10.3 mg/dL 9.7  9.8  9.8    Hold off on restarting aldactone, will see BP response to cardizem reduction , enc fluid, now on thin liq  12. CAP: Has completed antibiotic regimen 11/05/22.             --monitor for hypoxia/dyspnea-->denies respiratory symptoms.  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 No symptoms?  14. Constipation   -12/4 - LBM 12/1, order sorbitol '30mg'$     LOS: 8 days A FACE TO FACE EVALUATION WAS PERFORMED  AndCharlett Blake/07/2022, 6:57 AM

## 2022-11-17 MED ORDER — CAMPHOR-MENTHOL 0.5-0.5 % EX LOTN
TOPICAL_LOTION | CUTANEOUS | Status: DC | PRN
  Filled 2022-11-17: qty 222

## 2022-11-17 MED ORDER — DIPHENHYDRAMINE HCL 25 MG PO CAPS: 25.0000 mg | ORAL_CAPSULE | Freq: Four times a day (QID) | ORAL | Status: DC | PRN

## 2022-11-17 NOTE — Progress Notes (Signed)
Speech Language Pathology Daily Session Note  Patient Details  Name: Briggette Najarian MRN: [id number] Date of Birth: 1941/12/10  Today's Date: 11/17/2022 SLP Individual Time: 0915-1000 SLP Individual Time Calculation (min): 45 min  Short Term Goals: Week 2: SLP Short Term Goal 1 (Week 2): STG=LTG due to ELOS  Skilled Therapeutic Interventions:   Skilled intervention focused on cognition. Pt demonstrated recall of swallow strategies with min A. Educated on sealing lips together to decrease anterior spillage. She completed working memory task of mod complexity with min A. She stated her current physical and cognitive limitations and what she needs to continue to work on in therapy with min A. Pt left seated upright in bed with bed alarm set and call button within reach.   Pain Pain Assessment Pain Scale: Faces Faces Pain Scale: No hurt  Therapy/Group: Individual Therapy  Darrol Poke Leodis Alcocer 11/17/2022, 9:58 AM

## 2022-11-18 DIAGNOSIS — I6601 Occlusion and stenosis of right middle cerebral artery: Secondary | ICD-10-CM | POA: Diagnosis not present

## 2022-11-18 NOTE — Progress Notes (Signed)
PROGRESS NOTE   Subjective/Complaints:     Review of Systems  Constitutional:  Negative for chills and fever.  HENT:  Negative for congestion.   Eyes:  Negative for blurred vision.  Respiratory:  Negative for shortness of breath.   Cardiovascular:  Negative for chest pain.  Gastrointestinal:  Positive for constipation and nausea.  Genitourinary: Negative.   Neurological:  Positive for weakness. Negative for headaches.      Objective:   No results found. No results for input(s): "WBC", "HGB", "HCT", "PLT" in the last 72 hours.  No results for input(s): "NA", "K", "CL", "CO2", "GLUCOSE", "BUN", "CREATININE", "CALCIUM" in the last 72 hours.   Intake/Output Summary (Last 24 hours) at 11/18/2022 0926 Last data filed at 11/18/2022 0737 Gross per 24 hour  Intake 270 ml  Output --  Net 270 ml         Physical Exam: Vital Signs Blood pressure 118/79, pulse (!) 59, temperature 97.9 F (36.6 C), resp. rate 18, height 5' 7.5" (1.715 m), weight 66 kg, SpO2 97 %.  General: No acute distress Mood and affect are appropriate Heart: Regular rate and rhythm no rubs murmurs or extra sounds Lungs: Clear to auscultation, breathing unlabored, no rales or wheezes Abdomen: Positive bowel sounds, soft nontender to palpation, nondistended Extremities: No clubbing, cyanosis, or edema   Skin: small sparse papules upper back, also with macular areas upper back, abd and elbow creases Neuro:  Left central 7. Mild dysarthria. Fair insight and awareness. Needs redirection at times.  Moves left arm and leg well  4/5. Sensory exam normal for light touch and pain in all 4 limbs. No limb ataxia or cerebellar signs. No abnormal tone appreciated.   Sensation feels "a little different" to LT in LUE vs RUE  Musculoskeletal: Full ROM, No pain with AROM or PROM in the neck, trunk, or extremities. Posture appropriate     Assessment/Plan: 1.  Functional deficits which require 3+ hours per day of interdisciplinary therapy in a comprehensive inpatient rehab setting. Physiatrist is providing close team supervision and 24 hour management of active medical problems listed below. Physiatrist and rehab team continue to assess barriers to discharge/monitor patient progress toward functional and medical goals  Care Tool:  Bathing    Body parts bathed by patient: Right arm, Left arm, Chest, Abdomen, Front perineal area, Right upper leg, Left upper leg, Face   Body parts bathed by helper: Right lower leg, Buttocks, Left lower leg     Bathing assist Assist Level: Moderate Assistance - Patient 50 - 74%     Upper Body Dressing/Undressing Upper body dressing   What is the patient wearing?: Pull over shirt    Upper body assist Assist Level: Minimal Assistance - Patient > 75%    Lower Body Dressing/Undressing Lower body dressing      What is the patient wearing?: Pants     Lower body assist Assist for lower body dressing: Maximal Assistance - Patient 25 - 49%     Toileting Toileting Toileting Activity did not occur (Clothing management and hygiene only): Refused  Toileting assist Assist for toileting: Dependent - Patient 0%     Transfers Chair/bed transfer  Transfers assist     Chair/bed transfer assist level: Contact Guard/Touching assist Chair/bed transfer assistive device: Programmer, multimedia   Ambulation assist   Ambulation activity did not occur: Safety/medical concerns (unable d/t increased dizziness and nausea from BPPV)  Assist level: Contact Guard/Touching assist Assistive device: Walker-rolling Max distance: 15   Walk 10 feet activity   Assist  Walk 10 feet activity did not occur: Safety/medical concerns  Assist level: Contact Guard/Touching assist Assistive device: Walker-rolling   Walk 50 feet activity   Assist Walk 50 feet with 2 turns activity did not occur: Safety/medical  concerns (nausea)  Assist level: Contact Guard/Touching assist Assistive device: Walker-rolling    Walk 150 feet activity   Assist Walk 150 feet activity did not occur: Safety/medical concerns (nausea)         Walk 10 feet on uneven surface  activity   Assist Walk 10 feet on uneven surfaces activity did not occur: Safety/medical concerns (nausea)         Wheelchair     Assist Is the patient using a wheelchair?: Yes Type of Wheelchair: Manual Wheelchair activity did not occur: Safety/medical concerns (unable d/t increased dizziness and nausea from BPPV)         Wheelchair 50 feet with 2 turns activity    Assist    Wheelchair 50 feet with 2 turns activity did not occur: Safety/medical concerns       Wheelchair 150 feet activity     Assist  Wheelchair 150 feet activity did not occur: Safety/medical concerns       Blood pressure 118/79, pulse (!) 59, temperature 97.9 F (36.6 C), resp. rate 18, height 5' 7.5" (1.715 m), weight 66 kg, SpO2 97 %.  Medical Problem List and Plan: 1. 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  Antithrombotics: -DVT/anticoagulation:  Pharmaceutical: Eliquis             -antiplatelet therapy: N/A 3. Pain Management: N/A 4. Mood/Behavior/Sleep: LCSW to follow for evaluation and support.              -antipsychotic agents: N/A 5. Neuropsych/cognition: This patient maybe capable of making decisions on her own behalf. 6. Skin/Wound Care: Routine pressure relief measures.  Has some heat rash upper back but macular areas look more med related reviewed allergies , only med change that corresponds with symptoms is reduction of Cardizem CD from '360mg'$  to '300mg'$  , will consult pharmacy to eval  Pt has relief of pruritus with bendryl and sarna 7. Fluids/Electrolytes/Nutrition: Monitor  I/O.      Latest Ref Rng & Units 11/09/2022    5:55 AM 11/08/2022    1:46 AM 11/07/2022    1:33 AM  BMP  Glucose 70 - 99 mg/dL 118  105  118   BUN 8 - 23 mg/dL '20  17  17   '$ Creatinine 0.44 - 1.00 mg/dL 1.22  1.13  1.12   Sodium 135 - 145 mmol/L 139  139  135   Potassium 3.5 - 5.1 mmol/L 3.5  3.7  3.8   Chloride 98 - 111 mmol/L 99  103  101   CO2 22 - 32 mmol/L '28  26  25   '$ Calcium 8.9 - 10.3  mg/dL 9.8  9.2  8.8   Renal fxn stable 12/1 Recheck BMET in am    8. MVR/A fib: Monitor HR TID--on Eliquis and Cardizem but heart rate seems to be uncontrolled.             --monitor for symptoms with increase in activity.              --plans for CV in the future.             --heart healthy diet. 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   -monitor for swallowing/tolerance of  cardizem CD   Filed Weights   11/16/22 0318 11/17/22 0500 11/18/22 0500  Weight: 67.8 kg 66.7 kg 66 kg  11/14 Weight a little down today    9. HTN: Monitor BP TID--on Cardizem and Lasix-->recommendations to d/c Lasix and start aldactone in am.              --lasix to be used prn overload.      Vitals:    11/08/22 2005 11/09/22 0416  BP: 117/69 115/82  Pulse: (!) 56 82  Resp: 16 16  Temp: 97.8 F (36.6 C) 97.8 F (36.6 C)  SpO2: 97% 98%    12/5 Bp well controlled, will hold spironolactone  With sitting at EOB with ACE wraps  SBP down to 45mHg , pt feels nauseated  Will reduce cardizem to '300mg'$  in am , 258mfluid bolus 12/6  Was up with PT yesterday sys BP remained above 10025m 10. Leucocytosis:  Monitor for fevers and other signs of infection.             12/2 no signs at present     Latest Ref Rng & Units 11/09/2022    5:55 AM 11/08/2022    1:46 AM 11/07/2022    1:33 AM  CBC  WBC 4.0 - 10.5 K/uL 14.7  14.2  11.8   Hemoglobin 12.0 - 15.0 g/dL 13.5  13.1  11.7   Hematocrit 36.0 - 46.0 % 42.1  40.8  37.2   Platelets 150 - 400 K/uL 367  312  287     11. Acute renal failure:  SCr  0.98-->1.25-->1.13      Latest Ref Rng & Units 11/14/2022    5:59 AM 11/12/2022    7:47 AM 11/09/2022    5:55 AM  BMP  Glucose 70 - 99 mg/dL 107  108  118   BUN 8 - 23 mg/dL '31  27  20   '$ Creatinine 0.44 - 1.00 mg/dL 1.26  1.25  1.22   Sodium 135 - 145 mmol/L 137  138  139   Potassium 3.5 - 5.1 mmol/L 3.6  3.8  3.5   Chloride 98 - 111 mmol/L 101  99  99   CO2 22 - 32 mmol/L '26  28  28   '$ Calcium 8.9 - 10.3 mg/dL 9.7  9.8  9.8    Hold off on restarting aldactone, will see BP response to cardizem reduction , enc fluid, now on thin liq  12. CAP: Has completed antibiotic regimen 11/05/22.             --monitor for hypoxia/dyspnea-->denies respiratory symptoms.  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 No symptoms?  14. Constipation   -12/4 - LBM 12/1, order sorbitol '30mg'$     LOS: 10 days A FACE TO FACE EVALUATION WAS PERFORMED  AndCharlett Blake/09/2022, 9:26 AM

## 2022-11-18 NOTE — Progress Notes (Signed)
Pharmacy Consult: Evaluation of Medications & Allergies   81 yo female with new rash that appears to be medication related (see below). Rash is maculopapular, spreading across her entire back and some areas into her abdomen. Pharmacy consulted to review medications and allergies that may be related. Patient reports that the rash is itchy and feels like many little blisters.   Diltiazem is a possible cause of maculopapular rash and is a new medication for this admission, initiated 10/29/22. Apixaban is also a possible cause although rare, would be a diagnosis of exclusion. Patient reported taking apixaban for "a few days" s/p THA in October of this year. Apixaban initiated during current admission on 10/31/22 for new onset Afib.     Francena Hanly, PharmD Pharmacy Resident  11/18/2022 11:37 AM

## 2022-11-19 DIAGNOSIS — I6601 Occlusion and stenosis of right middle cerebral artery: Secondary | ICD-10-CM | POA: Diagnosis not present

## 2022-11-19 DIAGNOSIS — I10 Essential (primary) hypertension: Secondary | ICD-10-CM | POA: Diagnosis not present

## 2022-11-19 DIAGNOSIS — N179 Acute kidney failure, unspecified: Secondary | ICD-10-CM | POA: Diagnosis not present

## 2022-11-19 LAB — BASIC METABOLIC PANEL
Anion gap: 11 (ref 5–15)
BUN: 18 mg/dL (ref 8–23)
CO2: 26 mmol/L (ref 22–32)
Calcium: 9.9 mg/dL (ref 8.9–10.3)
Chloride: 102 mmol/L (ref 98–111)
Creatinine, Ser: 1.42 mg/dL — ABNORMAL HIGH (ref 0.44–1.00)
GFR, Estimated: 37 mL/min — ABNORMAL LOW (ref >60)
Glucose, Bld: 112 mg/dL — ABNORMAL HIGH (ref 70–99)
Potassium: 4.5 mmol/L (ref 3.5–5.1)
Sodium: 139 mmol/L (ref 135–145)

## 2022-11-19 LAB — CBC
HCT: 41.3 % (ref 36.0–46.0)
Hemoglobin: 13.3 g/dL (ref 12.0–15.0)
MCH: 27.9 pg (ref 26.0–34.0)
MCHC: 32.2 g/dL (ref 30.0–36.0)
MCV: 86.6 fL (ref 80.0–100.0)
Platelets: 262 10*3/uL (ref 150–400)
RBC: 4.77 MIL/uL (ref 3.87–5.11)
RDW: 15.2 % (ref 11.5–15.5)
WBC: 10.4 10*3/uL (ref 4.0–10.5)
nRBC: 0 % (ref 0.0–0.2)

## 2022-11-19 MED ORDER — DILTIAZEM HCL 30 MG PO TABS
75.0000 mg | ORAL_TABLET | Freq: Four times a day (QID) | ORAL | Status: DC
  Administered 2022-11-19 – 2022-11-20 (×4): 75 mg via ORAL
  Filled 2022-11-19 (×4): qty 3

## 2022-11-19 NOTE — Progress Notes (Signed)
Occupational Therapy Session Note  Patient Details  Name: Leslie [name] MRN: [id number] Date of Birth: 15-Jan-1941  Today's Date: 11/19/2022 OT Individual Time: 1856-3149 OT Individual Time Calculation (min): 45 min  OT Individual Time: 1300-1345 OT Individual Time Calculation (min): 45 min  Short Term Goals: Week 2:  OT Short Term Goal 1 (Week 2): STG=LTG (d/t pt lenght of stay)  Skilled Therapeutic Interventions/Progress Updates:     AM Session: Pt received semi-reclined in bed in good spirits and receptive to skilled OT session. Pt reporting soreness in bottom d/t pressure sore, nursing staff notified for dressing change and application of ointment. TED hose and ACE wraps donned with Pt sitting up in bed. Focus of session transfer training, activity tolerance, and BADL re-training.   Pt transitioned to EOB with HOB elevated and increased time CGA. Abdominal binder donned with Pt sitting up EOB. Vitals taken EOB BP 115/65 (MAP 77) HR 57. Pt requested to walk to bathroom this AM. Pt completed sit>stand using RW min A and mod cues for hand placement and body mechanics. Pt ambulated to bathroom using RW and donned pants in standing min A. Pt transferred to standard toilet CGA reporting significant increase in nausea symptoms. Vitals taken with Pt sitting on toilet BP 103/56 (MAP 70) HR 115. Pt provided increased time on toilet for Nausea to decrease with Pt completing ankle pumps. Pt then completed LB dressing donning brief and pants with mod A to weave feet and adjust pants around waist in standing using RW. Pt completed stand pivot transfer to wc min A with RW. Pt reeducated on safety completing transfers for hand placement and body mechanics. Pt transported to sink total A in wc. Pt completed hand washing and teeth brushing seated at sink with mod verbal cues for problem solving and initiating tasks to turn on water, don tooth paste, and dry hands.   Pt was left resting in wc with call  bell in reach, chair alarm on, and all needs met.   PM Session: Pt received sitting back in recliner. Pt in good spirits and agreeable to skilled OT session. Pt reporting pain in B hips d/t sitting for prolonged time- Pt offered repositioning and moving out of recliner to decrease pain. Pt reporting she is feeling increasingly sleepy. Vitals taken with Pt sitting up in chair BP 64/41 (MAP 49). Pt instructed to complete LB ankle pumps to increase BP and overhead UE exercises- reciprocal punches to ceiling and crossing body to increase core engagement. Pt also requesting water- provided water and instructed to take small sips. BP 76/59 (MAP 66). Remainder of therapy session completed at wc level to increase Pt safety d/t low BP. Pt completed squat pivot transfer to wc with min A to guide hips and min verbal cues for technique. Pt transported to sink to complete oral care following lunch and wash face/chest to remove left over food. Pt able to initiate washing face and brushing teeth following one verbal cue, sequencing through teeth brushing and face washing with min questioning cues.   Pt completed squat pivot transfer back to bed min HHA. Pt back to bed CGA with Hob slightly elevated +time. Vitals taken with Pt sitting up in bed BP 95/70 (MAP 79). Pt was left resting in bed with call bell in reach, bed alarm on, and all needs met.   Therapy Documentation Precautions:  Precautions Precautions: Fall Precaution Comments: dys3, nectar, BPPV, L inattention Restrictions Weight Bearing Restrictions: No   ADL: ADL Eating: Supervision/safety  Where Assessed-Eating: Bed level Grooming: Setup Where Assessed-Grooming: Sitting at sink Upper Body Bathing: Minimal cueing, Setup Where Assessed-Upper Body Bathing: Edge of bed Lower Body Bathing: Minimal assistance Where Assessed-Lower Body Bathing: Edge of bed Upper Body Dressing: Supervision/safety, Minimal cueing Where Assessed-Upper Body Dressing: Edge of  bed Lower Body Dressing: Contact guard Where Assessed-Lower Body Dressing: Edge of bed Toileting: Minimal assistance, Contact guard Where Assessed-Toileting: Glass blower/designer: Therapist, music Method: Engineer, water: Energy manager: Not assessed Social research officer, government: Not assessed   Therapy/Group: Individual Therapy  Janey Genta 11/19/2022, 8:30 AM

## 2022-11-19 NOTE — Progress Notes (Signed)
Physical Therapy Session Note  Patient Details  Name: Leslie [name] MRN: [id number] Date of Birth: 1941-01-01  Today's Date: 11/19/2022 PT Individual Time: 8101-7510 PT Individual Time Calculation (min): 36 min  and Today's Date: 11/19/2022 PT Missed Time: 9 Minutes Missed Time Reason: Patient fatigue  Short Term Goals: Week 1:  PT Short Term Goal 1 (Week 1): Pt will initiate BPPV retraining. PT Short Term Goal 2 (Week 1): Pt will perform bed mobility with overall supervision. PT Short Term Goal 3 (Week 1): Pt will initiate standing transfers with overall CGA and reduced dizziness and nausea. PT Short Term Goal 4 (Week 1): Pt will initiate gait training. PT Short Term Goal 5 (Week 1): Pt will initiate stair training.  Skilled Therapeutic Interventions/Progress Updates:      Therapy Documentation Precautions:  Precautions Precautions: Fall Precaution Comments: dys3, nectar, BPPV, L inattention Restrictions Weight Bearing Restrictions: No  Pt agreeable to bed level PT session with max encouragement due to fatigue and BP. Pt reports right hip pain, provided repositioning and ice for relief. Pt performed following exercises with verbal and tactile cueing for form:   -Supine gluteal bridges 1 x 10   -Semi-reclined SLR 3 x 10 with 1.5# ankle weight   Pt left semi-reclined in bed with all needs in reach and bed alarm on and missed 9 minutes of skilled PT due to fatigue.    Therapy/Group: Individual Therapy  Verl Dicker Verl Dicker PT, DPT  11/19/2022, 7:54 AM

## 2022-11-19 NOTE — Progress Notes (Signed)
PROGRESS NOTE   Subjective/Complaints:  C/o still having some dizziness ("vertigo") which seems to be related to her bp when it drops.    Review of Systems  Constitutional:  Negative for chills and fever.  HENT:  Negative for congestion.   Eyes:  Negative for blurred vision.  Respiratory:  Negative for shortness of breath.   Cardiovascular:  Negative for chest pain.  Gastrointestinal:  Positive for constipation and nausea.  Genitourinary: Negative.   Neurological:  Positive for weakness. Negative for headaches.      Objective:   No results found. Recent Labs    11/19/22 0613  WBC 10.4  HGB 13.3  HCT 41.3  PLT 262    Recent Labs    11/19/22 0613  NA 139  K 4.5  CL 102  CO2 26  GLUCOSE 112*  BUN 18  CREATININE 1.42*  CALCIUM 9.9     Intake/Output Summary (Last 24 hours) at 11/19/2022 1312 Last data filed at 11/19/2022 1100 Gross per 24 hour  Intake 345 ml  Output --  Net 345 ml         Physical Exam: Vital Signs Blood pressure 99/63, pulse 88, temperature (!) 97.5 F (36.4 C), temperature source Oral, resp. rate 17, height 5' 7.5" (1.715 m), weight 66 kg, SpO2 97 %.  Constitutional: No distress . Vital signs reviewed. HEENT: NCAT, EOMI, oral membranes moist Neck: supple Cardiovascular: RRR without murmur. No JVD    Respiratory/Chest: CTA Bilaterally without wheezes or rales. Normal effort    GI/Abdomen: BS +, non-tender, non-distended Ext: no clubbing, cyanosis, or edema Psych: pleasant and cooperative, sl anxious  Skin: small sparse papules upper back, also with macular areas upper back, abd and elbow creases Neuro:  Left central 7. Mild dysarthria. Fair insight and awareness. Needs redirection at times.  Moves left arm and leg well  4/5. Sensory exam normal for light touch and pain in all 4 limbs. No limb ataxia or cerebellar signs. No abnormal tone appreciated.   Sensation sl decreased to  LT in LUE vs RUE  Musculoskeletal: Full ROM, No pain with AROM or PROM in the neck, trunk, or extremities. Posture appropriate     Assessment/Plan: 1. Functional deficits which require 3+ hours per day of interdisciplinary therapy in a comprehensive inpatient rehab setting. Physiatrist is providing close team supervision and 24 hour management of active medical problems listed below. Physiatrist and rehab team continue to assess barriers to discharge/monitor patient progress toward functional and medical goals  Care Tool:  Bathing    Body parts bathed by patient: Right arm, Left arm, Chest, Abdomen, Front perineal area, Right upper leg, Left upper leg, Face   Body parts bathed by helper: Right lower leg, Buttocks, Left lower leg     Bathing assist Assist Level: Moderate Assistance - Patient 50 - 74%     Upper Body Dressing/Undressing Upper body dressing   What is the patient wearing?: Pull over shirt    Upper body assist Assist Level: Minimal Assistance - Patient > 75%    Lower Body Dressing/Undressing Lower body dressing      What is the patient wearing?: Pants     Lower  body assist Assist for lower body dressing: Maximal Assistance - Patient 25 - 49%     Toileting Toileting Toileting Activity did not occur Landscape architect and hygiene only): Refused  Toileting assist Assist for toileting: Dependent - Patient 0%     Transfers Chair/bed transfer  Transfers assist     Chair/bed transfer assist level: Contact Guard/Touching assist Chair/bed transfer assistive device: Programmer, multimedia   Ambulation assist   Ambulation activity did not occur: Safety/medical concerns (unable d/t increased dizziness and nausea from BPPV)  Assist level: Contact Guard/Touching assist Assistive device: Walker-rolling Max distance: 15   Walk 10 feet activity   Assist  Walk 10 feet activity did not occur: Safety/medical concerns  Assist level: Contact  Guard/Touching assist Assistive device: Walker-rolling   Walk 50 feet activity   Assist Walk 50 feet with 2 turns activity did not occur: Safety/medical concerns (nausea)  Assist level: Contact Guard/Touching assist Assistive device: Walker-rolling    Walk 150 feet activity   Assist Walk 150 feet activity did not occur: Safety/medical concerns (nausea)         Walk 10 feet on uneven surface  activity   Assist Walk 10 feet on uneven surfaces activity did not occur: Safety/medical concerns (nausea)         Wheelchair     Assist Is the patient using a wheelchair?: Yes Type of Wheelchair: Manual Wheelchair activity did not occur: Safety/medical concerns (unable d/t increased dizziness and nausea from BPPV)         Wheelchair 50 feet with 2 turns activity    Assist    Wheelchair 50 feet with 2 turns activity did not occur: Safety/medical concerns       Wheelchair 150 feet activity     Assist  Wheelchair 150 feet activity did not occur: Safety/medical concerns       Blood pressure 99/63, pulse 88, temperature (!) 97.5 F (36.4 C), temperature source Oral, resp. rate 17, height 5' 7.5" (1.715 m), weight 66 kg, SpO2 97 %.  Medical Problem List and Plan: 1. 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  Antithrombotics: -DVT/anticoagulation:  Pharmaceutical: Eliquis             -antiplatelet therapy: N/A 3. Pain Management: N/A 4. Mood/Behavior/Sleep: LCSW to follow for evaluation and support.              -antipsychotic agents: N/A 5. Neuropsych/cognition: This patient maybe capable of making decisions on her own behalf. 6. Skin/Wound Care: Routine pressure relief measures.  Has some heat rash upper back but macular areas look more med related reviewed allergies , only med change that corresponds with  symptoms is reduction of Cardizem CD from '360mg'$  to '300mg'$  , will consult pharmacy to eval  Pt has relief of pruritus with bendryl and sarna 12/11 cardizem CD changed to IR form--sx seem better today?--obsv for resolution of pruritus 7. Fluids/Electrolytes/Nutrition: Monitor I/O.      Latest Ref Rng & Units 11/09/2022    5:55 AM 11/08/2022    1:46 AM 11/07/2022    1:33 AM  BMP  Glucose 70 - 99 mg/dL 118  105  118   BUN 8 - 23 mg/dL 20  17  17  Creatinine 0.44 - 1.00 mg/dL 1.22  1.13  1.12   Sodium 135 - 145 mmol/L 139  139  135   Potassium 3.5 - 5.1 mmol/L 3.5  3.7  3.8   Chloride 98 - 111 mmol/L 99  103  101   CO2 22 - 32 mmol/L '28  26  25   '$ Calcium 8.9 - 10.3 mg/dL 9.8  9.2  8.8   Renal fxn stable 12/1 12/11 I personally reviewed all of the patient's labs today -CR trending slowly up (1.42) -will ask pharmacy to review meds. Only med I see which might contribute is eliquis (but that would be a rare effect) -recheck labs tomorrow 8. MVR/A fib: Monitor HR TID--on Eliquis and Cardizem but heart rate seems to be uncontrolled.             --monitor for symptoms with increase in activity.              --plans for CV in the future.             --heart healthy diet. 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       Filed Weights   11/16/22 0318 11/17/22 0500 11/18/22 0500  Weight: 67.8 kg 66.7 kg 66 kg  Weights balanced    9. HTN: Monitor BP TID--on Cardizem and Lasix-->recommendations to d/c Lasix and start aldactone in am.              --lasix to be used prn overload.  12/11 bp controlled on cardizem '75mg'$  qid 10. Leucocytosis:  Monitor for fevers and other signs of infection.             12/2 no signs at present     Latest Ref Rng & Units 11/09/2022    5:55 AM 11/08/2022    1:46 AM 11/07/2022    1:33 AM  CBC  WBC 4.0 - 10.5 K/uL 14.7  14.2  11.8   Hemoglobin 12.0 - 15.0 g/dL 13.5  13.1  11.7   Hematocrit 36.0 - 46.0 % 42.1  40.8  37.2   Platelets 150 - 400 K/uL 367  312  287      11. Acute renal failure:  SCr 0.98-->1.25-->1.13      Latest Ref Rng & Units 11/19/2022    6:13 AM 11/14/2022    5:59 AM 11/12/2022    7:47 AM  BMP  Glucose 70 - 99 mg/dL 112  107  108   BUN 8 - 23 mg/dL '18  31  27   '$ Creatinine 0.44 - 1.00 mg/dL 1.42  1.26  1.25   Sodium 135 - 145 mmol/L 139  137  138   Potassium 3.5 - 5.1 mmol/L 4.5  3.6  3.8   Chloride 98 - 111 mmol/L 102  101  99   CO2 22 - 32 mmol/L '26  26  28   '$ Calcium 8.9 - 10.3 mg/dL 9.9  9.7  9.8    Hold off on restarting aldactone, will see BP response to cardizem reduction , enc fluid, now on thin liq  12/11 see #7 above 12. CAP: Has completed antibiotic regimen 11/05/22.             --monitor for hypoxia/dyspnea-->denies respiratory symptoms.  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 No symptoms?  14. Constipation    LBM 12/10     LOS: 11 days A FACE TO FACE EVALUATION WAS PERFORMED  Meredith Staggers 11/19/2022, 1:12 PM

## 2022-11-19 NOTE — Progress Notes (Signed)
Speech Language Pathology Daily Session Note  Patient Details  Name: Leslie [name] MRN: [id number] Date of Birth: 1941/06/19  Today's Date: 11/19/2022 SLP Individual Time: 0900-1000 SLP Individual Time Calculation (min): 60 min  Short Term Goals: Week 2: SLP Short Term Goal 1 (Week 2): STG=LTG due to ELOS  Skilled Therapeutic Interventions: Skilled ST treatment focused on swallowing and cognitive goals. Pt was greeted upright in wheelchair on arrival. Pt reported starting to get fatigued however agreeable to remain in w/c during session.   SLP assessed pt consuming thin liquids via straw with mildly delayed AP transit, no overt s/sx of aspiration, and clear vocal quality post swallows. Pt was initially on "no straw precautions" due to increased oral delay and concern for increased risk of aspiration. With pt demonstrating improved oral function and no overt s/sx of aspiration with thin liquids given by straw, recommend lifting straw precautions at this time. Pt agreeable but stated she prefers to drink via cup vs. straw. SLP also inquired with pt about trying whole pills in puree vs. crushed in anticipation of upcoming discharge. Pt stated she would prefer to wait for now and open to discussing again this week.   SLP facilitated a medication management task where pt read various medication labels and identified how they should be organized in a QID pillbox. Pt completed task with min-to-mod A for organization, problem solving, and reasoning. Accuracy of responses improved as task progressed.   Patient was left in wheelchair with alarm activated and immediate needs within reach at end of session. Continue per current plan of care.      Pain  None/denied  Therapy/Group: Individual Therapy  Afton Mikelson T Mattie Nordell 11/19/2022, 10:00 AM

## 2022-11-20 ENCOUNTER — Inpatient Hospital Stay (HOSPITAL_COMMUNITY): Payer: Medicare HMO

## 2022-11-20 DIAGNOSIS — I6601 Occlusion and stenosis of right middle cerebral artery: Secondary | ICD-10-CM | POA: Diagnosis not present

## 2022-11-20 LAB — BASIC METABOLIC PANEL
Anion gap: 9 (ref 5–15)
BUN: 18 mg/dL (ref 8–23)
CO2: 27 mmol/L (ref 22–32)
Calcium: 9.8 mg/dL (ref 8.9–10.3)
Chloride: 103 mmol/L (ref 98–111)
Creatinine, Ser: 1.21 mg/dL — ABNORMAL HIGH (ref 0.44–1.00)
GFR, Estimated: 45 mL/min — ABNORMAL LOW (ref >60)
Glucose, Bld: 116 mg/dL — ABNORMAL HIGH (ref 70–99)
Potassium: 3.7 mmol/L (ref 3.5–5.1)
Sodium: 139 mmol/L (ref 135–145)

## 2022-11-20 MED ORDER — ACETAMINOPHEN 10 MG/ML IV SOLN
INTRAVENOUS | Status: AC
  Filled 2022-11-20: qty 100

## 2022-11-20 MED ORDER — DILTIAZEM HCL 30 MG PO TABS
60.0000 mg | ORAL_TABLET | Freq: Four times a day (QID) | ORAL | Status: DC
  Administered 2022-11-20 – 2022-11-21 (×4): 60 mg via ORAL
  Filled 2022-11-20 (×4): qty 2

## 2022-11-20 NOTE — Progress Notes (Signed)
Occupational Therapy Session Note  Patient Details  Name: Leslie [name] MRN: [id number] Date of Birth: 09-16-1941  Today's Date: 11/20/2022 OT Individual Time: 0803-0900 OT Individual Time Calculation (min): 57 min    Short Term Goals: Week 2:  OT Short Term Goal 1 (Week 2): STG=LTG (d/t pt lenght of stay)  Skilled Therapeutic Interventions/Progress Updates:     Pt received resting in bed in good spirits reporting 0/10 pain and that her rash was feeling better. Pt receptive to skilled OT session with a focus on BADL and cognitive retraining. TED hose and ACE wrapped donned with Pt reclined in bed. MD in/out for AM rounds. MD notified of Pt low BP from previous OT session yesterday. Pt unable to recall if she ate breakfast this AM stating "the days are all just blurring together"; nursing staff in/out of room to bring breakfast tray and Pt notified she had not eaten breakfast.   Vitals taken beginning of session: BP reclined in bed- 101/70 (MAP 77). Pt transitioned from sitting up in bed to EOB CGA using bed features (railing and HOB elevated). Pt instructed to change into clean clothes while sitting in quiet room with door shut. Pt doffed gown, and donned clean shirt supervision and pants CGA when completing sit>stand using RW to bring pants to waist. Pt able to sequence and maintain attention on task ~5 minutes without need for verbal redirection. Abdominal binder donned EOB total A. Vitals taken following dressing: BP EOB ~5 minutes- 100/72 (MAP 81). Pt completed stand step transfer to WC using RW CGA and min verbal cues for hand positioning and body mechanics. Pt reported increase in nausea symptoms following transfer attempting to lean head over on RW. Vitals taken: BP Sitting in WC 81/42 (MAP 55). Pt instructed to sit up in [facility] and complete ankle pumps. Vital reassess with improvement in BP: BP Sitting ~5 minutes in WC 92/68 (MAP 75).   Pt positioned in wc with breakfast on tray table in  front of Pt. Pt provided verbal directions to take small bites of meal and swallow food completely before speaking or taking sips of water. Pt bedroom door closed with hospital noise still coming through door during task. Pt demonstrating improved selective and sustained attention completing task with min verbal cues to swallow food prior to talking to speak to nursing staff entering room during activity. Pt was handed off to nursing staff for supervision during meal time with all immediate needs met.   Therapy Documentation Precautions:  Precautions Precautions: Fall Precaution Comments: dys3, nectar, BPPV, L inattention Restrictions Weight Bearing Restrictions: No   ADL: ADL Eating: Supervision/safety Where Assessed-Eating: Bed level Grooming: Setup Where Assessed-Grooming: Sitting at sink Upper Body Bathing: Minimal cueing, Setup Where Assessed-Upper Body Bathing: Edge of bed Lower Body Bathing: Minimal assistance Where Assessed-Lower Body Bathing: Edge of bed Upper Body Dressing: Supervision/safety, Minimal cueing Where Assessed-Upper Body Dressing: Edge of bed Lower Body Dressing: Contact guard Where Assessed-Lower Body Dressing: Edge of bed Toileting: Minimal assistance, Contact guard Where Assessed-Toileting: Glass blower/designer: Therapist, music Method: Engineer, water: Energy manager: Not assessed Social research officer, government: Not assessed   Therapy/Group: Individual Therapy  Janey Genta 11/20/2022, 8:24 AM

## 2022-11-20 NOTE — Progress Notes (Signed)
PROGRESS NOTE   Subjective/Complaints:  Itching on back improved with sarna not taking benadryl per pt report Does not recall if she ate breakfast Alert oriented to person , place , situation but not time    Review of Systems  Constitutional:  Negative for chills and fever.  HENT:  Negative for congestion.   Eyes:  Negative for blurred vision.  Respiratory:  Negative for shortness of breath.   Cardiovascular:  Negative for chest pain.  Gastrointestinal:  Positive for constipation and nausea.  Genitourinary: Negative.   Neurological:  Positive for weakness. Negative for headaches.      Objective:   No results found. Recent Labs    11/19/22 0613  WBC 10.4  HGB 13.3  HCT 41.3  PLT 262    Recent Labs    11/19/22 0613 11/20/22 0527  NA 139 139  K 4.5 3.7  CL 102 103  CO2 26 27  GLUCOSE 112* 116*  BUN 18 18  CREATININE 1.42* 1.21*  CALCIUM 9.9 9.8     Intake/Output Summary (Last 24 hours) at 11/20/2022 0813 Last data filed at 11/19/2022 1749 Gross per 24 hour  Intake 358 ml  Output --  Net 358 ml         Physical Exam: Vital Signs Blood pressure (!) 121/56, pulse (!) 46, temperature 97.6 F (36.4 C), temperature source Oral, resp. rate 18, height 5' 7.5" (1.715 m), weight 66.2 kg, SpO2 97 %.   General: No acute distress Mood and affect are appropriate Heart: Regular rate and rhythm no rubs murmurs or extra sounds Lungs: Clear to auscultation, breathing unlabored, no rales or wheezes Abdomen: Positive bowel sounds, soft nontender to palpation, nondistended Extremities: No clubbing, cyanosis, or edema Skin: No evidence of breakdown, no evidence of rash   Skin: small sparse papules upper back, also with macular areas upper back, abd and elbow creases Neuro:  Left central 7. Mild dysarthria. Fair insight and awareness. Needs redirection at times.  Moves left arm and leg well  4/5. Sensory exam  normal for light touch and pain in all 4 limbs. No limb ataxia or cerebellar signs. No abnormal tone appreciated.   Sensation sl decreased to LT in LUE vs RUE  Musculoskeletal: Full ROM, No pain with AROM or PROM in the neck, trunk, or extremities. Posture appropriate     Assessment/Plan: 1. Functional deficits which require 3+ hours per day of interdisciplinary therapy in a comprehensive inpatient rehab setting. Physiatrist is providing close team supervision and 24 hour management of active medical problems listed below. Physiatrist and rehab team continue to assess barriers to discharge/monitor patient progress toward functional and medical goals  Care Tool:  Bathing    Body parts bathed by patient: Right arm, Left arm, Chest, Abdomen, Front perineal area, Right upper leg, Left upper leg, Face   Body parts bathed by helper: Right lower leg, Buttocks, Left lower leg     Bathing assist Assist Level: Moderate Assistance - Patient 50 - 74%     Upper Body Dressing/Undressing Upper body dressing   What is the patient wearing?: Pull over shirt    Upper body assist Assist Level: Minimal Assistance - Patient >  75%    Lower Body Dressing/Undressing Lower body dressing      What is the patient wearing?: Pants     Lower body assist Assist for lower body dressing: Maximal Assistance - Patient 25 - 49%     Toileting Toileting Toileting Activity did not occur Landscape architect and hygiene only): Refused  Toileting assist Assist for toileting: Dependent - Patient 0%     Transfers Chair/bed transfer  Transfers assist     Chair/bed transfer assist level: Contact Guard/Touching assist Chair/bed transfer assistive device: Programmer, multimedia   Ambulation assist   Ambulation activity did not occur: Safety/medical concerns (unable d/t increased dizziness and nausea from BPPV)  Assist level: Contact Guard/Touching assist Assistive device: Walker-rolling Max  distance: 15   Walk 10 feet activity   Assist  Walk 10 feet activity did not occur: Safety/medical concerns  Assist level: Contact Guard/Touching assist Assistive device: Walker-rolling   Walk 50 feet activity   Assist Walk 50 feet with 2 turns activity did not occur: Safety/medical concerns (nausea)  Assist level: Contact Guard/Touching assist Assistive device: Walker-rolling    Walk 150 feet activity   Assist Walk 150 feet activity did not occur: Safety/medical concerns (nausea)         Walk 10 feet on uneven surface  activity   Assist Walk 10 feet on uneven surfaces activity did not occur: Safety/medical concerns (nausea)         Wheelchair     Assist Is the patient using a wheelchair?: Yes Type of Wheelchair: Manual Wheelchair activity did not occur: Safety/medical concerns (unable d/t increased dizziness and nausea from BPPV)         Wheelchair 50 feet with 2 turns activity    Assist    Wheelchair 50 feet with 2 turns activity did not occur: Safety/medical concerns       Wheelchair 150 feet activity     Assist  Wheelchair 150 feet activity did not occur: Safety/medical concerns       Blood pressure (!) 121/56, pulse (!) 46, temperature 97.6 F (36.4 C), temperature source Oral, resp. rate 18, height 5' 7.5" (1.715 m), weight 66.2 kg, SpO2 97 %.  Medical Problem List and Plan: 1. 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  Antithrombotics: -DVT/anticoagulation:  Pharmaceutical: Eliquis             -antiplatelet therapy: N/A 3. Pain Management: N/A 4. Mood/Behavior/Sleep: LCSW to follow for evaluation and support.              -antipsychotic agents: N/A 5. Neuropsych/cognition: This patient maybe capable of making decisions on her own behalf. 6. Skin/Wound Care: Routine  pressure relief measures.  Has some heat rash upper back but macular areas look more med related reviewed allergies , only med change that corresponds with symptoms is reduction of Cardizem CD from '360mg'$  to '300mg'$  , will consult pharmacy to eval  Pt has relief of pruritus with bendryl and sarna 12/11 cardizem CD changed to IR form-- Rash is confined to back and post thigh, , likely contact dermatitis, pt states she has sensitive skin and uses hypoallergenic detergent at home  Hill order hypoallergenic sheets  7. Fluids/Electrolytes/Nutrition: Monitor  I/O.      Latest Ref Rng & Units 11/09/2022    5:55 AM 11/08/2022    1:46 AM 11/07/2022    1:33 AM  BMP  Glucose 70 - 99 mg/dL 118  105  118   BUN 8 - 23 mg/dL '20  17  17   '$ Creatinine 0.44 - 1.00 mg/dL 1.22  1.13  1.12   Sodium 135 - 145 mmol/L 139  139  135   Potassium 3.5 - 5.1 mmol/L 3.5  3.7  3.8   Chloride 98 - 111 mmol/L 99  103  101   CO2 22 - 32 mmol/L '28  26  25   '$ Calcium 8.9 - 10.3 mg/dL 9.8  9.2  8.8   Renal fxn stable 12/1 12/11 I personally reviewed all of the patient's labs today -CR trending slowly up (1.42) -will ask pharmacy to review meds. Only med I see which might contribute is eliquis (but that would be a rare effect) -was on ELiquis PTA 8. MVR/A fib: Monitor HR TID--on Eliquis and Cardizem but heart rate seems to be uncontrolled.             --monitor for symptoms with increase in activity.              --plans for CV in the future.             --heart healthy diet. 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       Filed Weights   11/17/22 0500 11/18/22 0500 11/20/22 0500  Weight: 66.7 kg 66 kg 66.2 kg  Weights balanced Crackles in lungs may be atelectasis but will order CXR to eval for pulm edema    9. HTN: Monitor BP TID--on Cardizem and Lasix-->recommendations to d/c Lasix and start aldactone in am.              --lasix to be used prn overload.  12/11 bp controlled on cardizem '75mg'$  qid Still with orthostasis  will reduce cardizem dose 12/12 10. Leucocytosis:  Monitor for fevers and other signs of infection.             12/2 no signs at present     Latest Ref Rng & Units 11/09/2022    5:55 AM 11/08/2022    1:46 AM 11/07/2022    1:33 AM  CBC  WBC 4.0 - 10.5 K/uL 14.7  14.2  11.8   Hemoglobin 12.0 - 15.0 g/dL 13.5  13.1  11.7   Hematocrit 36.0 - 46.0 % 42.1  40.8  37.2   Platelets 150 - 400 K/uL 367  312  287     11. Acute renal failure:  SCr 0.98-->1.25-->1.13      Latest Ref Rng & Units 11/20/2022    5:27 AM 11/19/2022    6:13 AM 11/14/2022    5:59 AM  BMP  Glucose 70 - 99 mg/dL 116  112  107   BUN 8 - 23 mg/dL '18  18  31   '$ Creatinine 0.44 - 1.00 mg/dL 1.21  1.42  1.26   Sodium 135 - 145 mmol/L 139  139  137   Potassium 3.5 - 5.1 mmol/L 3.7  4.5  3.6   Chloride 98 - 111 mmol/L 103  102  101   CO2 22 - 32 mmol/L '27  26  26   '$ Calcium 8.9 - 10.3 mg/dL 9.8  9.9  9.7    Hold off on restarting aldactone, will see BP response to  cardizem reduction , enc fluid, now on thin liq  12/11 see #7 above 12. CAP: Has completed antibiotic regimen 11/05/22.             --monitor for hypoxia/dyspnea-->denies respiratory symptoms.  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 No symptoms?  14. Constipation    LBM 12/12     LOS: 12 days A FACE TO FACE EVALUATION WAS PERFORMED  Charlett Blake 11/20/2022, 8:13 AM

## 2022-11-20 NOTE — Progress Notes (Signed)
Patient ID: Leslie [name], female   DOB: 12-Apr-1941, 81 y.o.   MRN: [id number]  Patient requesting to have Ensure/Boost daily. Sw will discuss with team.

## 2022-11-20 NOTE — Progress Notes (Signed)
Physical Therapy Weekly Progress Note  Patient Details  Name: Leslie [name] MRN: [id number] Date of Birth: Apr 11, 1941  Beginning of progress report period: November 09, 2022 End of progress report period: November 20, 2022  Today's Date: 11/20/2022 PT Individual Time: 7169-6789 and 1700-1755 PT Individual Time Calculation (min): 28 min  and 55 min    Patient has met 3 of 5 short term goals. Leslie [name] is making slow progress towards goals due to orthostatic hypotension and significant reports of fatigue. She is requiring use of abdominal binder, thigh high TEDs, and ACE wraps to maintain BP to allow participation in standing and gait training activities. She is performing supine<>sit with supervision, sit<>stand and stand pivot transfers using RW with CGA/min assist, and when BP stable is able to ambulate up to 21f using RW. Due to pt's slower than anticipated progress, she would benefit from an extension on CIR to further address her impaired endurance to allow pt to safely participate in functional mobility from a standing/ambulatory level at D/C (while having stable BP). Pt will be receiving 24hr support from her daughter, Leslie [name] upon D/C.  Patient continues to demonstrate the following deficits muscle weakness, decreased cardiorespiratoy endurance, impaired timing and sequencing, unbalanced muscle activation, and decreased motor planning,  , decreased initiation, decreased attention, decreased awareness, decreased problem solving, decreased safety awareness, decreased memory, and delayed processing, and decreased standing balance, decreased postural control, and decreased balance strategies and therefore will continue to benefit from skilled PT intervention to increase functional independence with mobility.  Patient progressing toward long term goals..  Continue plan of care.  PT Short Term Goals Week 1:  PT Short Term Goal 1 (Week 1): Pt will initiate BPPV retraining. PT Short  Term Goal 1 - Progress (Week 1): Progressing toward goal PT Short Term Goal 2 (Week 1): Pt will perform bed mobility with overall supervision. PT Short Term Goal 2 - Progress (Week 1): Met PT Short Term Goal 3 (Week 1): Pt will initiate standing transfers with overall CGA and reduced dizziness and nausea. PT Short Term Goal 3 - Progress (Week 1): Met PT Short Term Goal 4 (Week 1): Pt will initiate gait training. PT Short Term Goal 4 - Progress (Week 1): Met PT Short Term Goal 5 (Week 1): Pt will initiate stair training. PT Short Term Goal 5 - Progress (Week 1): Progressing toward goal Week 2:  PT Short Term Goal 1 (Week 2): = to LTGs based on ELOS  Skilled Therapeutic Interventions/Progress Updates:  Ambulation/gait training;Community reintegration;DME/adaptive equipment instruction;Neuromuscular re-education;Psychosocial support;Stair training;UE/LE Strength taining/ROM;Wheelchair propulsion/positioning;UE/LE Coordination activities;Therapeutic Activities;Skin care/wound management;Pain management;Discharge planning;Balance/vestibular training;Cognitive remediation/compensation;Disease management/prevention;Functional mobility training;Patient/family education;Splinting/orthotics;Therapeutic Exercise;Visual/perceptual remediation/compensation   Session 1: Pt received sitting in w/c and reporting fatigue, but with min encouragement agreeable to therapy session. Pt already wearing B LE thigh high TED hose with ACE wraps as well as abdominal binder. Transported to/from gym in w/c for time management and energy conservation. Vitals to start session: BP 114/90 (MAP 99), HR 54bpm Pt requiring encouragement to stand and with delayed initiation due to reporting feeling she has "no energy." Sit>stand w/c>RW with light min assist. Gait training 423fusing RW with CGA/light min assist and +2 w/c follow due to fatigue and hx of orthostatic BP - pt with slow gait speed although upright posture and slight  reciprocal stepping pattern. Vitals after: BP 114/88 (MAP 98), HR 96bpm . Transported back to room and pt requesting to return to bed. Short distance ~[id number] transfer w/c>EOB using  RW with CGA/light min assist to come to standing and steady while ambulating that short distance. Sit>R sidelying supervision. Doffed abdominal binder and ACE wraps since pt going to be resting in the bed. Pt quickly falling asleep once lying. Pt left with needs in reach and bed alarm on.     Session 2: Pt received sitting in recliner with her nephew exiting upon therapist arrival. Pt reports fatigue but agreeable to therapy session with encouragement. Assessed vitals: BP 102/73 (MAP 80), HR 58bpm with only thigh high TED hose   Donned abdominal binder and B LE thigh high ACE wraps.   Threaded pants on LEs with min assist and pt started to button pants while they were around her thighs showing impaired awareness. Sit>stand recliner>RW with min assist for rising to stand when pt noted to be incontinent (bowel and bladder). Pt reporting onset of nausea and unable to tolerate standing for >10seconds. Reassessed vitals: BP 107/89 (MAP 96), HR 142bpm (reassessed using pulse ox to be 80bpm)   Therapist encouraging pt to ambulate to bathroom for further toileting needs and peri-care but pt reporting too significant fatigue to ambulate that distance. Short distance ~77f ambulatory transfer recliner>w/c using RW with light min assist.   Transported in/out bathroom in w/c. L stand pivot w/c>toilet using UE support on grab bar with min assist for balance while turning and dependent LB clothing management (pt again needing to sit quickly while turning). Pt reports significant fatigue and some nausea while on toilet, leaning forward resting her head in the wheelchair. Continent of bowels and bladder.  Therapist cuing pt to stand for peri-care but then pt turns and sits in w/c prior to therapist being able to complete hygiene.  Sit<>stands x3 from w/c to B UE support on grab bar to allow completion of hygiene and pulling up LB clothing over her hips dependently due to pt only being able to tolerate standing for <20seconds each trial.  Transported in w/c to sink and performed hang hygiene. Pt's daughter, Leslie [name] had arrived and expresses concerns regarding pt's CLOF, orthostatic hypotension, and pt's care needs upon D/C - therapist informed her of plan to re-discuss pt's status during team conference tomorrow and determine if current POC is still appropriate. Pt left seated in w/c with needs in reach, seat belt alarm on, nurse present, and meal tray in place.   Therapy Documentation Precautions:  Precautions Precautions: Fall Precaution Comments: dys3, nectar, BPPV, L inattention Restrictions Weight Bearing Restrictions: No   Pain:  Session 1: No reports of pain throughout session.  Session 2: Reports she has been having rectal pain the past few evenings and feels she is starting to become impacted - pt with large BM on toilet during therapy session (charted).    Therapy/Group: Individual Therapy  CTawana Scale, PT, DPT, NCS, CSRS 11/20/2022, 7:54 AM

## 2022-11-20 NOTE — Discharge Summary (Signed)
Physician Discharge Summary  Patient ID: Tyiana Hill MRN: [id number] DOB/AGE: August 09, 1941 81 y.o.  Admit date: 11/08/2022 Discharge date: 11/23/2022  Discharge Diagnoses:  Principal Problem:   Atrial fibrillation with rapid ventricular response ([name]) Active Problems:   Middle cerebral artery embolism, right   Protein-calorie malnutrition, severe   Orthostatic hypotension   CKD (chronic kidney disease)   Discharged Condition: serious  Significant Diagnostic Studies:  CLINICAL DATA:  Delirium   EXAM: PORTABLE CHEST 1 VIEW   COMPARISON:  11/20/2022   FINDINGS: Post infectious inflammatory scarring in the right lower lung. Left lung is clear. No pleural effusion or pneumothorax.   The heart is normal in size.   IMPRESSION: Post infectious inflammatory scarring in the right lower lung.     Electronically Signed   By: Julian Hy [name]   On: 11/27/2022 20:05  Labs:  Basic Metabolic Panel: Recent Labs  Lab 11/19/22 0613 11/20/22 0527 11/23/22 0842  NA 139 139 138  K 4.5 3.7 3.8  CL 102 103 103  CO2 '26 27 27  '$ GLUCOSE 112* 116* 134*  BUN '18 18 19  '$ CREATININE 1.42* 1.21* 1.36*  CALCIUM 9.9 9.8 9.8    CBC:    Latest Ref Rng & Units 11/19/2022    6:13 AM 11/09/2022    5:55 AM 11/08/2022    1:46 AM  CBC  WBC 4.0 - 10.5 K/uL 10.4  14.7  14.2   Hemoglobin 12.0 - 15.0 g/dL 13.3  13.5  13.1   Hematocrit 36.0 - 46.0 % 41.3  42.1  40.8   Platelets 150 - 400 K/uL 262  367  312      CBG: No results for input(s): "GLUCAP" in the last 168 hours.  Brief HPI:   Jimmye Wisnieski is a 81 y.o. female with history CVA, CKD who was sent to the hospital via MD office on 10/29/22 with lightheadedness and dizziness due to A fib with RVR.  She was started on IV Cardizem and heparin.  She reported chest pain and was found to have right layering pleural effusion with RUL PNA and was treated with IV antibiotics as well as thoracocentesis of 500 cc parapneumonic  effusion.  2D echo done showing EF 40 to 45% with global hypokinesis, moderate asymmetrical LVH, moderate to severe MVR and dilated inferior vena cava.  She was started on metoprolol but developed  SOB as SE therefore this was d/c and she was transitioned to cardizem for rate control as well as eliquis with plans for CV in 3 weeks.  On 11/03/22, she had sudden onset of right gaze deviation with left knee paresis and left neglect secondary to right M1 occlusion.  She underwent cerebral angiogram with thrombectomy of R-MCA occluded segment with complete revascularization.  Dr. Erlinda Hong felt the stroke was embolic due to A-fib with RVR despite being on Eliquis.  Follow-up MRI brain done revealing small acute infarct in right frontal, temporal lobe and caudate body with advanced small vessel disease.  Cardiology was consulted for ongoing issues with a flutter which converted with IV amiodarone and Cardizem was titrated up to 360 mg but she continued to have issues with tachycardia with heart rates up to 120s even at rest.  MBS was done for swallow eval showing trace aspiration with thins and she was started on D3 with nectar liquids.  Therapy was working with her patient was limited by left-sided weakness with left inattention, cognitive impairments, left sensory deficits as well as balance deficits.  CIR was recommended due to functional decline.   Hospital Course: Wallace Cogliano was admitted to rehab 11/08/2022 for inpatient therapies to consist of PT, ST and OT at least three hours five days a week. Past admission physiatrist, therapy team and rehab RN have worked together to provide customized collaborative inpatient rehab. Her heart rate and BP were monitored on TID basis and aldactone was discontinued to help support BP.  Her weights were monitored daily and were noted to be stable overall without signs of overload. Serial CBC showed leucocytosis to be gradually resolving and no  signs of infection noted. Her  respiratory status was stable and cough has resolved. Acute renal failure improved off aldactone and with increase in fluid intake. Her endurance has steadily improved but she continued to be limited by orthostatic changes with increase in activity.   Abdominal binder, TEDs and ace wraps were used for BP support. She did develop rash felt to be due to Cardizem CD therefore was changed to short acting formulation. Benadryl with Sarna lotion in addition to medication change was effective management of pruritus.  Cardizem was gradually weaned off by 12/13 due ongoing issues with  nausea and BP drops with activity. She has also been limited by vertigo and OT has also worked with patient on unitization of gaze stabilization exercises. Swallow function had improved and she was advanced to D3, thin liquids.  She had progressed to min assist and family education was completed. On 12/15, she developed rapid A fib with HR up to 160's. She was started on fluid bolus due to hypotension. Dr. Percival Spanish was consulted and recommended IV amiodarone bolus for rate control.  Dr. Harvest Forest were consulted for assistance and was discharged to acute hospital w/telemetry for further treatment.    Rehab course: During patient's stay in rehab weekly team conferences were held to monitor patient's progress, set goals and discuss barriers to discharge. At admission, patient required min to max assist with basic ADL tasks and min assist with mobility.  She exhibited mild to moderate cognitive linguistic deficits with mild dysarthria, mild dysphonia and moderate oropharyngeal dysphagia. She  has had improvement in activity tolerance, balance, postural control as well as ability to compensate for deficits.  She is able to complete ADL tasks with supervision to min assist. She requires supervision for transfers and min assist to ambulate 23' with RW. She requires supervision with cognitive tasks and to use compensatory strategies. She requires  min to mod cues to sustain attention to tasks. Family education has been completed.     Current Medications:   amiodarone  150 mg Intravenous Once   apixaban  5 mg Oral BID   vitamin B-12  1,000 mcg Oral Daily   famotidine  20 mg Oral Daily   feeding supplement  237 mL Oral TID BM   Gerhardt's butt cream   Topical BID   levothyroxine  88 mcg Oral Q0600   mouth rinse  15 mL Mouth Rinse 4 times per day   rosuvastatin  10 mg Oral Daily   senna-docusate  2 tablet Oral BID     Diet: Dysphagia 3, thin liquids. Medications crushed in puree. Need to check for residue/pocketing. Intermittent supervision with meals.   Special Instructions: Use abdominal binder and TEDs for BP support when out of bed. Doff binder when in chair or in bed. No driving  Disposition: Acute hospital      Follow-up Information     Sonia Side., FNP  Follow up.   Specialty: Family Medicine Why: Call in 1-2 days for post hospital follow up Contact information: [place] [address] [phone]         Martinique, Peter M, MD Follow up.   Specialty: Cardiology Contact information: [address] [place] [address] [phone]         Charlett Blake, MD Follow up.   Specialty: Physical Medicine and Rehabilitation Why: office will call you with follow up appointment Contact information: [place] [address] [phone]         [facility] Follow up.   Why: office will call you with follow up appointment Contact information: [address]     [place] [id number] [phone]                Signed: Bary Leriche 11/23/2022, 7:03 PM

## 2022-11-20 NOTE — Progress Notes (Addendum)
Spoke with Leslie [name] from x-ray to verify patient is on the list for x-ray per MD. X-ray to be obtained later today. PA aware. Notified oncoming nurse.       Yehuda Mao, LPN

## 2022-11-20 NOTE — Progress Notes (Signed)
Speech Language Pathology Daily Session Note  Patient Details  Name: Leslie [name] MRN: [id number] Date of Birth: 01-14-1941  Today's Date: 11/20/2022 SLP Individual Time: 1100-1200 SLP Individual Time Calculation (min): 60 min  Short Term Goals: Week 2: SLP Short Term Goal 1 (Week 2): STG=LTG due to ELOS  Skilled Therapeutic Interventions: Skilled ST treatment focused on cognitive and swallowing goals. Pt received supine in bed on arrival and required moderate cues to rouse. Pt initially appeared lethargic ut became increasingly more alert as session progressed. SLP assess vitals d/t hx of low BP which was 106/50, HR 100, O2 sats 98%. Pt agreeable to sit EOB for session and c/o mild nausea. SLP retrieved saltine crackers and gingerale which were mildly effective. Pt required min A verbal cues to monitor mild L anterior spillage with both solids and liquids. Pt consumed snack without overt s/sx of aspiration. She continues to exhibit minimal awareness of anterior spillage despite denying impaired sensation. Pt continues to require at least min A for intellectual awareness and moderate-to-max A verbal and contextual cues for emergent awareness. Pt exhibited tangential speech and required max A verbal redirection cues for topic maintenance, while sustaining attention for <30 second intervals when not addressing structured, hands-on tasks. Sustained attention improved to 2-3 minute intervals with min verbal redirection cues during hands-on tasks, although pt still easily distractible to side conversation. SLP facilitated the session with a basic-to-mildly complex novel card game targeting memory, attention, problem solving, and reasoning skills. Pt completed task with min A fading to supervision A verbal cues for problem solving and error awareness. Pt corrected errors with sup A verbal cues. Pt utilized external memory aid to better recall task instructions with mod fading to min A verbal cues.  Patient was left in bed with alarm activated and immediate needs within reach at end of session. Continue per current plan of care.      Pain  None/denied  Therapy/Group: Individual Therapy  Patty Sermons 11/20/2022, 12:34 PM

## 2022-11-21 DIAGNOSIS — I6601 Occlusion and stenosis of right middle cerebral artery: Secondary | ICD-10-CM | POA: Diagnosis not present

## 2022-11-21 MED ORDER — ENSURE ENLIVE PO LIQD
237.0000 mL | Freq: Three times a day (TID) | ORAL | Status: DC
  Administered 2022-11-21 – 2022-11-23 (×8): 237 mL via ORAL

## 2022-11-21 MED ORDER — DILTIAZEM HCL 30 MG PO TABS
30.0000 mg | ORAL_TABLET | Freq: Four times a day (QID) | ORAL | Status: DC
  Administered 2022-11-21 – 2022-11-23 (×7): 30 mg via ORAL
  Filled 2022-11-21 (×8): qty 1

## 2022-11-21 NOTE — Progress Notes (Signed)
Team Conference Report to Patient/Family  Team Conference discussion was reviewed with the patient and caregiver, including goals, any changes in plan of care and target discharge date.  Patient and caregiver express understanding and are in agreement.  The patient has a target discharge date of 11/26/22 (D/C date extended).  Sw spoke with patient son in law, Leslie [name] and provided team conference updates by request. Patient SIL and daughter concerned about patient progress and her limited ability to get out of bed. Family is concerned about patient being unable to care for herself of transfer to bathroom. Family expressed they do not feel as Monday will be enough time for patient and requesting additional time. Family informed patient still requiring 24/7 care. Patient family will be in attendance for family education this Friday. Sw will have physician follow up with family on medial concerns. No additional questions or concerns.  Leslie [name] 11/21/2022, 11:24 AM

## 2022-11-21 NOTE — Progress Notes (Addendum)
PROGRESS NOTE   Subjective/Complaints:   No breathing issues , working with SLP , pt looking forward to seeing her beagles at home   Review of Systems  Constitutional:  Negative for chills and fever.  HENT:  Negative for congestion.   Eyes:  Negative for blurred vision.  Respiratory:  Negative for shortness of breath.   Cardiovascular:  Negative for chest pain.  Gastrointestinal:  Positive for constipation and nausea.  Genitourinary: Negative.   Neurological:  Positive for weakness. Negative for headaches.      Objective:   DG Chest 2 View  Result Date: 11/20/2022 CLINICAL DATA:  Increased breath sounds in the bases EXAM: CHEST - 2 VIEW COMPARISON:  10/31/2022 FINDINGS: Cardiac shadow is within normal limits. Previously seen vascular congestion and edema has resolved. Some patchy residual density noted over the right base which may represent some scarring. Short-term follow-up to assess for stability is recommended. No focal infiltrate or effusion is noted. No bony abnormality is seen. IMPRESSION: Persistent density in the right lung base likely representing some scarring from previous infiltrate. Continued follow-up is recommended. Electronically Signed   By: Inez Catalina [name]   On: 11/20/2022 19:53   Recent Labs    11/19/22 0613  WBC 10.4  HGB 13.3  HCT 41.3  PLT 262    Recent Labs    11/19/22 0613 11/20/22 0527  NA 139 139  K 4.5 3.7  CL 102 103  CO2 26 27  GLUCOSE 112* 116*  BUN 18 18  CREATININE 1.42* 1.21*  CALCIUM 9.9 9.8     Intake/Output Summary (Last 24 hours) at 11/21/2022 0831 Last data filed at 11/20/2022 1847 Gross per 24 hour  Intake 720 ml  Output --  Net 720 ml         Physical Exam: Vital Signs Blood pressure 113/83, pulse 67, temperature 98 F (36.7 C), resp. rate 16, height 5' 7.5" (1.715 m), weight 66.2 kg, SpO2 99 %.   General: No acute distress Mood and affect are  appropriate Heart: Regular rate and rhythm no rubs murmurs or extra sounds Lungs: Clear to auscultation, breathing unlabored, no rales or wheezes Abdomen: Positive bowel sounds, soft nontender to palpation, nondistended Extremities: No clubbing, cyanosis, or edema Skin: No evidence of breakdown, no evidence of rash   Skin: small sparse papules upper back, also with macular areas upper back, abd and elbow creases Neuro:  Left central 7. Mild dysarthria. Fair insight and awareness. Needs redirection at times.  Moves left arm and leg well  4/5. Sensory exam normal for light touch and pain in all 4 limbs. No limb ataxia or cerebellar signs. No abnormal tone appreciated.   Sensation sl decreased to LT in LUE vs RUE  Musculoskeletal: Full ROM, No pain with AROM or PROM in the neck, trunk, or extremities. Posture appropriate     Assessment/Plan: 1. Functional deficits which require 3+ hours per day of interdisciplinary therapy in a comprehensive inpatient rehab setting. Physiatrist is providing close team supervision and 24 hour management of active medical problems listed below. Physiatrist and rehab team continue to assess barriers to discharge/monitor patient progress toward functional and medical goals  Care  Tool:  Bathing    Body parts bathed by patient: Right arm, Left arm, Chest, Abdomen, Front perineal area, Right upper leg, Left upper leg, Face   Body parts bathed by helper: Right lower leg, Buttocks, Left lower leg     Bathing assist Assist Level: Moderate Assistance - Patient 50 - 74%     Upper Body Dressing/Undressing Upper body dressing   What is the patient wearing?: Pull over shirt    Upper body assist Assist Level: Minimal Assistance - Patient > 75%    Lower Body Dressing/Undressing Lower body dressing      What is the patient wearing?: Pants     Lower body assist Assist for lower body dressing: Maximal Assistance - Patient 25 - 49%     Toileting Toileting  Toileting Activity did not occur Landscape architect and hygiene only): Refused  Toileting assist Assist for toileting: Dependent - Patient 0%     Transfers Chair/bed transfer  Transfers assist     Chair/bed transfer assist level: Minimal Assistance - Patient > 75% Chair/bed transfer assistive device: Armrests, Programmer, multimedia   Ambulation assist   Ambulation activity did not occur: Safety/medical concerns (unable d/t increased dizziness and nausea from BPPV)  Assist level: Minimal Assistance - Patient > 75% Assistive device: Walker-rolling Max distance: 16f   Walk 10 feet activity   Assist  Walk 10 feet activity did not occur: Safety/medical concerns  Assist level: Contact Guard/Touching assist Assistive device: Walker-rolling   Walk 50 feet activity   Assist Walk 50 feet with 2 turns activity did not occur: Safety/medical concerns (nausea)  Assist level: Contact Guard/Touching assist Assistive device: Walker-rolling    Walk 150 feet activity   Assist Walk 150 feet activity did not occur: Safety/medical concerns (nausea)         Walk 10 feet on uneven surface  activity   Assist Walk 10 feet on uneven surfaces activity did not occur: Safety/medical concerns (nausea)         Wheelchair     Assist Is the patient using a wheelchair?: Yes Type of Wheelchair: Manual Wheelchair activity did not occur: Safety/medical concerns (unable d/t increased dizziness and nausea from BPPV)         Wheelchair 50 feet with 2 turns activity    Assist    Wheelchair 50 feet with 2 turns activity did not occur: Safety/medical concerns       Wheelchair 150 feet activity     Assist  Wheelchair 150 feet activity did not occur: Safety/medical concerns       Blood pressure 113/83, pulse 67, temperature 98 F (36.7 C), resp. rate 16, height 5' 7.5" (1.715 m), weight 66.2 kg, SpO2 99 %.  Medical Problem List and Plan: 1.  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  Antithrombotics: -DVT/anticoagulation:  Pharmaceutical: Eliquis             -antiplatelet therapy: N/A 3. Pain Management: N/A 4. Mood/Behavior/Sleep: LCSW to follow for evaluation and support.              -antipsychotic agents: N/A 5. Neuropsych/cognition: This patient maybe capable of making decisions on her own behalf. 6. Skin/Wound Care: Routine pressure  relief measures.  Has some heat rash upper back but macular areas look more med related reviewed allergies , only med change that corresponds with symptoms is reduction of Cardizem CD from '360mg'$  to '300mg'$  , will consult pharmacy to eval  Now on Diltiazem '60mg'$  QID   Rash is confined to back and post thigh, , likely contact dermatitis, pt states she has sensitive skin and uses hypoallergenic detergent at home  Hill order hypoallergenic sheets  7. Fluids/Electrolytes/Nutrition: Monitor I/O.      Latest Ref Rng & Units 11/09/2022    5:55 AM 11/08/2022    1:46 AM 11/07/2022    1:33 AM  BMP  Glucose 70 - 99 mg/dL 118  105  118   BUN 8 - 23 mg/dL '20  17  17   '$ Creatinine 0.44 - 1.00 mg/dL 1.22  1.13  1.12   Sodium 135 - 145 mmol/L 139  139  135   Potassium 3.5 - 5.1 mmol/L 3.5  3.7  3.8   Chloride 98 - 111 mmol/L 99  103  101   CO2 22 - 32 mmol/L '28  26  25   '$ Calcium 8.9 - 10.3 mg/dL 9.8  9.2  8.8   Renal fxn stable 12/1 12/11 I personally reviewed all of the patient's labs today -CR trending slowly up (1.42) -will ask pharmacy to review meds. Only med I see which might contribute is eliquis (but that would be a rare effect) -was on ELiquis PTA 8. MVR/A fib: Monitor HR TID--on Eliquis and Cardizem but heart rate seems to be uncontrolled.             --monitor for symptoms with increase in activity.              --plans for CV in the  future.             --heart healthy diet. 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       Filed Weights   11/17/22 0500 11/18/22 0500 11/20/22 0500  Weight: 66.7 kg 66 kg 66.2 kg  Weights balanced Crackles in lungs may be atelectasis but will order CXR to eval for pulm edema    9. HTN: Monitor BP TID--on Cardizem and Lasix-->recommendations to d/c Lasix and start aldactone in am.              --lasix to be used prn overload.  12/11 bp controlled on cardizem '75mg'$  qid Still with orthostasis will reduce cardizem dose 12/13 to '30mg'$  q6 h 10. Leucocytosis:  Monitor for fevers and other signs of infection.             12/2 no signs at present     Latest Ref Rng & Units 11/09/2022    5:55 AM 11/08/2022    1:46 AM 11/07/2022    1:33 AM  CBC  WBC 4.0 - 10.5 K/uL 14.7  14.2  11.8   Hemoglobin 12.0 - 15.0 g/dL 13.5  13.1  11.7   Hematocrit 36.0 - 46.0 % 42.1  40.8  37.2   Platelets 150 - 400 K/uL 367  312  287     11. Acute renal failure:  SCr 0.98-->1.25-->1.13 improved       Latest Ref Rng & Units 11/20/2022    5:27 AM 11/19/2022    6:13 AM 11/14/2022    5:59 AM  BMP  Glucose 70 - 99 mg/dL 116  112  107   BUN 8 - 23 mg/dL 18  18  31   Creatinine 0.44 - 1.00 mg/dL 1.21  1.42  1.26   Sodium 135 - 145 mmol/L 139  139  137   Potassium 3.5 - 5.1 mmol/L 3.7  4.5  3.6   Chloride 98 - 111 mmol/L 103  102  101   CO2 22 - 32 mmol/L '27  26  26   '$ Calcium 8.9 - 10.3 mg/dL 9.8  9.9  9.7    Hold off on restarting aldactone, will see BP response to cardizem reduction , enc fluid, now on thin liq  12/11 see #7 above 12. CAP: Has completed antibiotic regimen 11/05/22.             --monitor for hypoxia/dyspnea-->denies respiratory symptoms.  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 No symptoms?  14. Constipation    LBM 12/12     LOS: 13 days A FACE TO FACE EVALUATION WAS PERFORMED  Charlett Blake 11/21/2022, 8:31 AM

## 2022-11-21 NOTE — Plan of Care (Signed)
Goals downgraded due to slower than anticipated progress   Problem: RH Attention Goal: LTG Patient will demonstrate this level of attention during functional activites (SLP) Description: LTG:  Patient will will demonstrate this level of attention during functional activites (SLP) Flowsheets (Taken 11/21/2022 1049) LTG: Patient will demonstrate this level of attention during cognitive/linguistic activities with assistance of (SLP): Moderate Assistance - Patient 50 - 74%   Problem: RH Awareness Goal: LTG: Patient will demonstrate awareness during functional activites type of (SLP) Description: LTG: Patient will demonstrate awareness during functional activites type of (SLP) Flowsheets (Taken 11/21/2022 1049) LTG: Patient will demonstrate awareness during cognitive/linguistic activities with assistance of (SLP): Moderate Assistance - Patient 50 - 74%

## 2022-11-21 NOTE — Progress Notes (Addendum)
Initial Nutrition Assessment  DOCUMENTATION CODES:   Severe malnutrition in context of acute illness/injury  INTERVENTION:    Discussed importance of nutrition with pt and daughter, strategies to maximize kcals/protein consumed at mealtimes  Ensure Enlive po TID, each supplement provides 350 kcal and 20 grams of protein.  Magic cup TID with meals, each supplement provides 290 kcal and 9 grams of protein   NUTRITION DIAGNOSIS:   Severe Malnutrition related to acute illness as evidenced by severe muscle depletion, severe fat depletion, 15 percent weight loss in < 1 month.   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 Pt admitted to CIR due to functional deficits.   Per care team plan for d/c has been extended to 12/18. Per family pt is not getting out of bed much due to dizziness.  Daughter and pt provide hx. Pt reports her usual weight was 170 lb which is confirmed by charting,  pt weighed in 10/2022. Pt's current weight is down to 145 lb which is a loss of 25 lb or 15% of her body weight in < 1 month.  Per daughter pt has been eating very little, last night supper she consumed about 3 large tablespoonfuls of food total.  Pt describes symptoms of early satiety and poor appetite.  Pt does like ensure but feels it is very thick and hard to drink, tried with pt over ice which helped and pt is willing to drink these.    Meal Completion: 25-50% of Dysphagia 3 with thin liquids   Admission weight (11/30): 67.3 kg  Current weight: 66.2 kg   Medications reviewed and include: vitamin B12 1000 mcg daily, pepcid, syntrhoid, crestor, senokot-s   Labs reviewed     NUTRITION - FOCUSED PHYSICAL EXAM:  Flowsheet Row Most Recent Value  Orbital Region Severe depletion  Upper Arm Region Mild depletion  Thoracic and Lumbar Region No depletion  Buccal Region Severe depletion  Temple Region Severe depletion  Clavicle Bone Region Mild depletion  Clavicle and Acromion Bone Region Mild depletion  Scapular Bone Region Unable to assess  Dorsal Hand Severe depletion  Patellar Region Severe depletion  Anterior Thigh Region Severe depletion  Posterior Calf Region Severe depletion  Edema (RD Assessment) None  Hair Reviewed  Eyes Reviewed  Mouth Reviewed  Skin Reviewed  Nails Reviewed       Diet Order:   Diet Order             DIET DYS 3 Room service appropriate? Yes; Fluid consistency: Thin  Diet effective now                   EDUCATION NEEDS:   Education needs have been addressed  Skin:  Skin Assessment: Reviewed RN Assessment  Last BM:  12/12 x 3 type 4/5  Height:   Ht Readings from Last 1 Encounters:  11/08/22 5' 7.5" (1.715 m)    Weight:   Wt Readings from Last 1 Encounters:  11/20/22 66.2 kg   BMI:  Body mass index is 22.52 kg/m.  Estimated Nutritional Needs:   Kcal:  1700-1900  Protein:  85-100 grams  Fluid:  >1.6 L/day  Lockie Pares., RD, LDN, CNSC See AMiON for contact information

## 2022-11-21 NOTE — Progress Notes (Signed)
Physical Therapy Session Note  Patient Details  Name: Leslie [name] MRN: [id number] Date of Birth: [birth year]  Today's Date: 11/21/2022 PT Individual Time: 1136-1201 PT Individual Time Calculation (min): 25 min   Short Term Goals: Week 1:  PT Short Term Goal 1 (Week 1): Pt will initiate BPPV retraining. PT Short Term Goal 1 - Progress (Week 1): Progressing toward goal PT Short Term Goal 2 (Week 1): Pt will perform bed mobility with overall supervision. PT Short Term Goal 2 - Progress (Week 1): Met PT Short Term Goal 3 (Week 1): Pt will initiate standing transfers with overall CGA and reduced dizziness and nausea. PT Short Term Goal 3 - Progress (Week 1): Met PT Short Term Goal 4 (Week 1): Pt will initiate gait training. PT Short Term Goal 4 - Progress (Week 1): Met PT Short Term Goal 5 (Week 1): Pt will initiate stair training. PT Short Term Goal 5 - Progress (Week 1): Progressing toward goal Week 2:  PT Short Term Goal 1 (Week 2): = to LTGs based on ELOS Week 3:     Skilled Therapeutic Interventions/Progress Updates:   Pt received supine in bed and agreeable to PT at bed level. Supine therex for BLE Pt  left supine in bed with call bell in reach and all needs met.       Therapy Documentation Precautions:  Precautions Precautions: Fall Precaution Comments: dys3, nectar, BPPV, L inattention Restrictions Weight Bearing Restrictions: No General:   Vital Signs: Therapy Vitals Temp: 97.6 F (36.4 C) Temp Source: Oral Pulse Rate: 94 Resp: 18 BP: 99/65 Patient Position (if appropriate): Sitting Oxygen Therapy SpO2: 99 % O2 Device: Room Air Pain:   Mobility:   Locomotion :    Trunk/Postural Assessment :    Balance:   Exercises:   Other Treatments:      Therapy/Group: Individual Therapy  [name] 11/21/2022, 4:08 PM

## 2022-11-21 NOTE — Progress Notes (Signed)
Physical Therapy Session Note  Patient Details  Name: Leslie [name] MRN: [id number] Date of Birth: [birth year]  Today's Date: 11/21/2022 PT Individual Time: 5885-0277 and 1550-1620 PT Individual Time Calculation (min): 58 min and 30 min  Short Term Goals: Week 2:  PT Short Term Goal 1 (Week 2): = to LTGs based on ELOS  Skilled Therapeutic Interventions/Progress Updates:    Session 1: Pt received sitting in w/c with abdominal binger, B LE thigh high TED hose, and ACE wraps donned (appear to not have been removed since this therapist saw patient yesterday evening - reinforced education with pt's LPN and NT as well as charge nurse that these items are to be removed at night when pt in the bed). Pt agreeable to therapy session.  Vitals assessed to start session sitting in w/c: BP 88/53 (MAP 65), HR 57bpm   Pt noted to have on soiled brief (urine). Transported in/out bathroom in w/c.  Partial stand pivot transfer w/c<>toilet using B UE support on grab bar with CGA/min assist for steadying/safety - dependent LB clothing management with pt trying to sit prior to therapist fully removing the brief. Pt further continent of bladder and voided bowels with increased time - pt reporting feeling nauseous and feeling like she could be "throwing up toenails" while sitting on toilet. During hygiene, pt required ~6x sit<>stands and standing with B UE support on grab bar with CGA during dependent peri-care because pt unable to tolerate standing for >20seconds due to fatigue and presumed possible orthostatic hypotension based on earlier BP - requires prolonged seated rest breaks prior to being able to return to standing. Noticed pt with increased redness on buttocks - notified LPN.  Vitals after toileting: BP 85/66 (MAP 73), HR 128bpm   Pt's daughter, [name], arrived. Therapist started hands-on family education/training to allow pt's daughter to assist pt with bed<>wheelchair transfers via stand pivot using  RW. Thearpist educated on proper set-up of w/c, pt's need to be wearing abdominal binder, TEDs, and ACE wraps; use of gait belt, and how to provide pt with the necessary CGA/min assist. Pt performed block practice stand pivot transfer w/c<>EOB using RW with her daughter's assistance demonstrating understanding. Pt's daughter reports feeling comfortable assisting pt with this - Safety Plan updated.  Pt requesting to return to bed and rest. Sit>supine supervision. Doffed abdominal binder, TEDs, and ACE wraps while in the bed for skin integrity as well as improved BP management. Pt left in bed with needs in reach and her daughter present to assume care of patient.   Session 2: Pt received supported upright in bed awake and agreeable to therapy session. Pt reporting she is drinking a boost and feeling that she has more energy - pt with increased verbosity this afternoon.  Vitals in bed: BP 105/65 (MAP 78), HR 63bpm  Donned B LE thigh high TED hose and ACE wraps Transitioned to sitting R EOB, HOB elevated and using bedrail, pt requiring 3x cuing to come sit EOB prior to initiating this transition due to pt being internally distracted by telling a story. Vitals sitting EOB: BP 87/74 (MAP 80), HR 103bpm with pt starting to report some increased fatigue but no other symptoms. Donned abdominal binder.  Sit<>stands using RW with CGA/light min assist during session.   Gait training 104f + 668fusing RW with min assist for balance and AD management - pt with slow gait speed and decreased B LE step lengths and slight increased postural instability - +2 providing w/c follow in  event of fatigue or low BP. Vitals after each walk:  1st: BP 91/58 (MAP 68), HR 84bpm  2nd: BP 106/85 (MAP 94), HR 61bpm   At end of session, pt left seated in w/c with needs in reach, seat belt alarm on, and NT aware of pt's position.    Therapy Documentation Precautions:  Precautions Precautions: Fall Precaution Comments: dys3,  nectar, BPPV, L inattention Restrictions Weight Bearing Restrictions: No   Pain: Session 1: Reports soreness on her buttocks during peri-care; therapist very gentle to avoid shearing forces as much as possible during peri-care and nurse notified to assess her skin.  Session 2: No reports of pain throughout session.   Therapy/Group: Individual Therapy  Tawana Scale , PT, DPT, NCS, CSRS 11/21/2022, 7:47 AM

## 2022-11-21 NOTE — Patient Care Conference (Signed)
Inpatient RehabilitationTeam Conference and Plan of Care Update Date: 11/21/2022   Time: 10:46 AM    Patient Name: Leslie [name]      Medical Record Number: [id number]  Date of Birth: Mar 18, 1941 Sex: Female         Room/Bed: 4W21C/4W21C-01 Payor Info: Payor: [employer] MEDICARE / Plan: [place] HMO / Product Type: *No Product type* /    Admit Date/Time:  11/08/2022  5:21 PM  Primary Diagnosis:  Middle cerebral artery embolism, right  Hospital Problems: Principal Problem:   Middle cerebral artery embolism, right    Expected Discharge Date: Expected Discharge Date: 11/26/22 (D/C date extended)  Team Members Present: Physician leading conference: Dr. Alysia Penna Social Worker Present: Erlene Quan, [place] Nurse Present: Dorien Chihuahua, RN PT Present: Page Spiro, PT OT Present: Other (comment) Lowella Fairy, OT) SLP Present: Sherren Kerns, SLP PPS Coordinator present : Gunnar Fusi, SLP     Current Status/Progress Goal Weekly Team Focus  Bowel/Bladder   Patient is continent/incontinent of bowel/bladder   Pt will gain continence of bowel/bladder        Swallow/Nutrition/ Hydration   min A for implementation of strategies with consumption of dysphagia 3 diet and thin liquids   sup A  tolerance of current diet with implmenetation of swallowing compensations and precautions    ADL's   significantly impacted by orthostatic hypotnsion symptoms of nausea when OOB and sitting upright; Pt unable to tolerate standing for short periods of time (<1 min) during BADL for LB clothing management. Supervision UB dressing/bathing, Min- Mod A LB dressing, min bathing (sink bath), min toileting; CGA-min A squat pivot or stand pivot transfers to toilet using RW   Supervision   BADL retrianing, orthostatic hypotension educaiton, d/c planning, sitting/standing tolerance, activity tolerance    Mobility   continues to have severely limited endurance/activity tolerance with  reports of severe fatigue; continuing to use abdominal binder, thigh high TEDs, and ACE wraps for BP management to allow participation with standing and gait training - supervision supine<>sit,  CGA/min assist sit<>stand and stand pivot transfers using RW, gait training inconsitent but up to 57f using RW with CGA/min assist   supervision overall ambulating  pt/family education, activity tolerance, BP management, bed mobility training, transfer training, standing tolerance and balance, gait training    Communication   mod I - goal met   mod I        Safety/Cognition/ Behavioral Observations  mod-to-max A   min A - will likely consider downgrading due to slow progress   problem solving, awareness, sustained attention, functional recall, education    Pain   patient denies pain   patient will remain free of pain        Skin   Pt has MASD and rasd on bottom, back, and abdomen   Pts botom will heal         Discharge Planning:  Discharging home with daughter and family to rotate 24/7 supervision.   Team Discussion: Patient with hx of SVD, vascular dementia post Right MCA CVA. Now with contact dermatitis; hypoallergenic sheets in place.  MD adjusting meds for orthostasis; continue TEDs, ace wraps and binder. Continue to note poor endurance; unable to tolerate standing, needs seated or bed level activities.  Patient on target to meet rehab goals: No, progress limited by poor endurance and orthostasis. Currently needs supervision for upper body care and min - mod for lower body care. Completes squat pivots with RW and CGA- min assist. Requires max assist  for attention, min assist for anterior spillage management and limited distractions when eating.   *See Care Plan and progress notes for long and short-term goals.   Revisions to Treatment Plan:  Discharge date extended; medication modifications   Teaching Needs: Safety, medications, secondary risk management, dietary modifications,  transfers, toileting, etc  Current Barriers to Discharge: Decreased caregiver support and Home enviroment access/layout  Possible Resolutions to Barriers: Family education scheduled for 11/23/22 HH follow up services DME: [facility]     Medical Summary Current Status: orthostatic hypotension still an issues, pt switched from diltiazem CD to IR due to inability to swallow meds whole  Barriers to Discharge: Medical stability   Possible Resolutions to Barriers/Weekly Focus: hypoallergenic sheets, anti pruritis creme, benadryl prn, may need further adjustment of BP meds   Continued Need for Acute Rehabilitation Level of Care: The patient requires daily medical management by a physician with specialized training in physical medicine and rehabilitation for the following reasons: Direction of a multidisciplinary physical rehabilitation program to maximize functional independence : Yes Medical management of patient stability for increased activity during participation in an intensive rehabilitation regime.: Yes Analysis of laboratory values and/or radiology reports with any subsequent need for medication adjustment and/or medical intervention. : Yes   I attest that I was present, lead the team conference, and concur with the assessment and plan of the team.   Dorien Chihuahua B 11/21/2022, 4:17 PM

## 2022-11-21 NOTE — Progress Notes (Signed)
Speech Language Pathology Daily Session Note  Patient Details  Name: Leslie [name] MRN: [id number] Date of Birth: 12/16/1940  Today's Date: 11/21/2022 SLP Individual Time: 0803-0900 SLP Individual Time Calculation (min): 57 min  Short Term Goals: Week 2: SLP Short Term Goal 1 (Week 2): STG=LTG due to ELOS  Skilled Therapeutic Interventions: Skilled ST treatment focused on swallowing and cognitive goals. Pt was greeted upright in wheelchair on arrival with breakfast tray in front of her. Pt required min-to-mod A to initiate and persist to task. Although external distractions were minimized, pt continued to appear internally distracted and would often stare at her plate with minimal initiation to eat. Pt reported "I'm not a big breakfast eater." Discussed with nursing and pt to reduce supervision status to intermittent supervision at this time. Daughter able to provide supervision. Pt consumed meal with min A cues to monitor anterior loss and to clear mild oral stasis. Pt exhibited no overt s/sx of aspiration with consumption of dysphagia 3 textures and thin liquids. Continue current diet. SLP facilitated overall mod A verbal/visual cues to complete a mildly complex calendar organization task in regards to attention, working memory, Theatre stage manager, providing adequate detail, error awareness, and repair. Patient was left in wheelchair with alarm activated and immediate needs within reach at end of session. Continue per current plan of care.      Pain Pain Assessment Pain Scale: 0-10 Pain Score: 0-No pain  Therapy/Group: Individual Therapy  Kha Hari T Kenzo Ozment 11/21/2022, 9:02 AM

## 2022-11-21 NOTE — Progress Notes (Signed)
Occupational Therapy Session Note  Patient Details  Name: Leslie [name] MRN: [id number] Date of Birth: 07-17-1941  Today's Date: 11/21/2022 OT Individual Time: 1400-1500 OT Individual Time Calculation (min): 60 min    Short Term Goals: Week 2:  OT Short Term Goal 1 (Week 2): STG=LTG (d/t pt lenght of stay)  Skilled Therapeutic Interventions/Progress Updates:     Pt received resting in bed in good spirits with nursing staff present in room preparing to assist Pt with oral care. Pt handed off to OT. Pt reporting mild pain in R hip from "sitting too long"- Pt offered repositioning in bed while OT donned TED hose and ACE wrap. Vitals taken with Pt reclined in bed- BP 97/69 (MAP 78).   Pt transitioned from reclined to EOB with supervision. Abdominal binder donned with Pt EOB. Vitals taken after sitting ~2 minutes- BP 80/64 (MAP 71) with increase in nausea symptoms. Pt instructed to complete ankle pumps sitting EOB. Vitals taken after ~5 minutes of sitting- BP 97/62 (MAP 73). Pt completed squat pivot transfer to wc to L min A and mod verbal cues for technique and hand placement. Pt transported total A to bathroom. Pt completed stand pivot transfer to toilet with mod A to bring pants from waist to knees for toileting. Pt reporting increase in nausea symptoms following transfer. Bp taken ~1 minute of sitting- BP 105/73 (MAP 83). Pt brief noted to be soiled, Pt also having continent void in toilet. Pt completed sit>stand using grab bar CGA and pulled pants to waist with mod A. Pt completed side steps min HHA to wc. Pt transported to sink total A in wc.   Pt washed her hands seated in wc with mod questioning cues for sequencing. Pt brushed teeth sitting at sink with supervision and increased time for problem solving. Pt able to attend to task in quiet room with no distractions for ~3 minutes.   Pt completed squat pivot transfer back to bed CGA with mod verbal cues for body mechanics and safety. Pt  CGA back to bed. Pt was left resting in bed with call bell in reach, bed alarm on, TEDs/ACE Wrap/Abdominal binder doffed, and all needs met.    Therapy Documentation Precautions:  Precautions Precautions: Fall Precaution Comments: dys3, nectar, BPPV, L inattention Restrictions Weight Bearing Restrictions: No General:   Vital Signs: Therapy Vitals Temp: 97.6 F (36.4 C) Temp Source: Oral Pulse Rate: 94 Resp: 18 BP: 99/65 Patient Position (if appropriate): Sitting Oxygen Therapy SpO2: 99 % O2 Device: Room Air Pain:   ADL: ADL Eating: Supervision/safety Where Assessed-Eating: Bed level Grooming: Setup Where Assessed-Grooming: Sitting at sink Upper Body Bathing: Minimal cueing, Setup Where Assessed-Upper Body Bathing: Edge of bed Lower Body Bathing: Minimal assistance Where Assessed-Lower Body Bathing: Edge of bed Upper Body Dressing: Supervision/safety, Minimal cueing Where Assessed-Upper Body Dressing: Edge of bed Lower Body Dressing: Contact guard Where Assessed-Lower Body Dressing: Edge of bed Toileting: Minimal assistance, Contact guard Where Assessed-Toileting: Glass blower/designer: Therapist, music Method: Engineer, water: Energy manager: Not assessed Social research officer, government: Not assessed   Therapy/Group: Individual Therapy  Janey Genta 11/21/2022, 2:39 PM

## 2022-11-22 DIAGNOSIS — I6601 Occlusion and stenosis of right middle cerebral artery: Secondary | ICD-10-CM | POA: Diagnosis not present

## 2022-11-22 NOTE — Progress Notes (Signed)
Recreational Therapy Session Note  Patient Details  Name: Leslie [name] MRN: [id number] Date of Birth: Mar 30, 1941 Today's Date: 11/22/2022  Pain: no c/o Skilled Therapeutic Interventions/Progress Updates: Pt asleep in the w/c upon arrival, difficult to stay awake.  Assisted pt to bed as she stated she was tired.  Pt performed stand pivot transfer to bed with min assist due to fatigue.  Given extra time, pt performed bed mobility using rails and bed functions with supervision.  THTs and abdominal binder removed, alarm set and all needs within reach.   Allison 11/22/2022, 12:06 PM

## 2022-11-22 NOTE — Progress Notes (Signed)
Occupational Therapy Session Note  Patient Details  Name: Leslie [name] MRN: [id number] Date of Birth: 07/03/1941  Today's Date: 11/22/2022 OT Individual Time: 1330-1430 OT Individual Time Calculation (min): 60 min    Short Term Goals: Week 2:  OT Short Term Goal 1 (Week 2): STG=LTG (d/t pt lenght of stay)  Skilled Therapeutic Interventions/Progress Updates:     Pt received resting in bed presenting to be anxious. Pt reporting she is concerned about phon e calls she has been receiving from the government. Pt provided education on scam calls and therapeutic listening/support to facilitate pt calming down in preparation for therapy session. Pt receptive of skilled OT session reporting 7/10 pain in her bottom from sitting in bed- pt provided repositioning to decrease pain. TEDs and ACE wraps donned with Pt in bed. Rn in/out for scheduled medication administration.   Pt transitioned from reclined in bed to EOB close supervision +time. Vitals taken with Pt EOB BP 97/65 HR 61. Pt reporting increase in nausea symptoms, but tolerating sitting EOB when provided therapeutic motivation and support. Vitals taken EOB ~2 minutes BP 72/53 (MAP 60) HR 73. Pt instructed to complete ankle pumps EOB in attempt to increase BP. Pt doffed shirt and completed UB bathing with min verbal cues to attend to task. Pt donned shirt EOB with set-up A. Abdominal binder donned total A. Vitals taken after ~10 minutes of sitting- BP 98/74 (MAP 82) HR 73. Pt requested to complete oral hygiene and grooming tasks seated at sink. Pt completed squat pivot transfer EOB>WC min A to guide hips. Pt brushed teeth with min questioning cues required to don tooth paste and turn off water. Pt sequenced through washing hands with increased time, no verbal cues required. Pt was left resting in wc with call bell in reach, seat belt alarm on, and all needs met. Pt handed off to SLP entering room.   Therapy Documentation Precautions:   Precautions Precautions: Fall Precaution Comments: dys3, nectar, BPPV, L inattention Restrictions Weight Bearing Restrictions: No General:   Vital Signs: Therapy Vitals BP: 97/65   ADL: ADL Eating: Supervision/safety Where Assessed-Eating: Bed level Grooming: Setup Where Assessed-Grooming: Sitting at sink Upper Body Bathing: Minimal cueing, Setup Where Assessed-Upper Body Bathing: Edge of bed Lower Body Bathing: Minimal assistance Where Assessed-Lower Body Bathing: Edge of bed Upper Body Dressing: Supervision/safety, Minimal cueing Where Assessed-Upper Body Dressing: Edge of bed Lower Body Dressing: Contact guard Where Assessed-Lower Body Dressing: Edge of bed Toileting: Minimal assistance, Contact guard Where Assessed-Toileting: Toilet Toilet Transfer: Contact guard Toilet Transfer Method: Squat pivot Toilet Transfer Equipment: Grab bars Tub/Shower Transfer: Not assessed Walk-In Shower Transfer: Not assessed  Therapy/Group: Individual Therapy   A  11/22/2022, 1:59 PM 

## 2022-11-22 NOTE — Progress Notes (Signed)
PROGRESS NOTE   Subjective/Complaints:   No breathing issues , working with SLP , pt looking forward to seeing her beagles at home   Review of Systems  Constitutional:  Negative for chills and fever.  HENT:  Negative for congestion.   Eyes:  Negative for blurred vision.  Respiratory:  Negative for shortness of breath.   Cardiovascular:  Negative for chest pain.  Gastrointestinal:  Positive for constipation and nausea.  Genitourinary: Negative.   Neurological:  Positive for weakness. Negative for headaches.      Objective:   DG Chest 2 View  Result Date: 11/20/2022 CLINICAL DATA:  Increased breath sounds in the bases EXAM: CHEST - 2 VIEW COMPARISON:  10/31/2022 FINDINGS: Cardiac shadow is within normal limits. Previously seen vascular congestion and edema has resolved. Some patchy residual density noted over the right base which may represent some scarring. Short-term follow-up to assess for stability is recommended. No focal infiltrate or effusion is noted. No bony abnormality is seen. IMPRESSION: Persistent density in the right lung base likely representing some scarring from previous infiltrate. Continued follow-up is recommended. Electronically Signed   By: Inez Catalina [name]   On: 11/20/2022 19:53   No results for input(s): "WBC", "HGB", "HCT", "PLT" in the last 72 hours.  Recent Labs    11/20/22 0527  NA 139  K 3.7  CL 103  CO2 27  GLUCOSE 116*  BUN 18  CREATININE 1.21*  CALCIUM 9.8    No intake or output data in the 24 hours ending 11/22/22 8295       Physical Exam: Vital Signs Blood pressure 102/79, pulse 91, temperature 98.7 F (37.1 C), resp. rate 16, height 5' 7.5" (1.715 m), weight 66.2 kg, SpO2 98 %.   General: No acute distress Mood and affect are appropriate Heart: Regular rate and rhythm no rubs murmurs or extra sounds Lungs: Clear to auscultation, breathing unlabored, no rales or  wheezes Abdomen: Positive bowel sounds, soft nontender to palpation, nondistended Extremities: No clubbing, cyanosis, or edema Skin: No evidence of breakdown, no evidence of rash   Skin: small sparse papules upper back, also with macular areas upper back, abd and elbow creases Neuro:  Left central 7. Mild dysarthria. Fair insight and awareness. Needs redirection at times.  Moves left arm and leg well  4/5. Sensory exam normal for light touch and pain in all 4 limbs. No limb ataxia or cerebellar signs. No abnormal tone appreciated.   Sensation sl decreased to LT in LUE vs RUE  Musculoskeletal: Full ROM, No pain with AROM or PROM in the neck, trunk, or extremities. Posture appropriate     Assessment/Plan: 1. Functional deficits which require 3+ hours per day of interdisciplinary therapy in a comprehensive inpatient rehab setting. Physiatrist is providing close team supervision and 24 hour management of active medical problems listed below. Physiatrist and rehab team continue to assess barriers to discharge/monitor patient progress toward functional and medical goals  Care Tool:  Bathing    Body parts bathed by patient: Right arm, Left arm, Chest, Abdomen, Front perineal area, Right upper leg, Left upper leg, Face   Body parts bathed by helper: Right lower leg, Buttocks,  Left lower leg     Bathing assist Assist Level: Moderate Assistance - Patient 50 - 74%     Upper Body Dressing/Undressing Upper body dressing   What is the patient wearing?: Pull over shirt    Upper body assist Assist Level: Minimal Assistance - Patient > 75%    Lower Body Dressing/Undressing Lower body dressing      What is the patient wearing?: Pants     Lower body assist Assist for lower body dressing: Maximal Assistance - Patient 25 - 49%     Toileting Toileting Toileting Activity did not occur Landscape architect and hygiene only): Refused  Toileting assist Assist for toileting: Dependent - Patient  0%     Transfers Chair/bed transfer  Transfers assist     Chair/bed transfer assist level: Minimal Assistance - Patient > 75% Chair/bed transfer assistive device: Armrests, Programmer, multimedia   Ambulation assist   Ambulation activity did not occur: Safety/medical concerns (unable d/t increased dizziness and nausea from BPPV)  Assist level: Minimal Assistance - Patient > 75% Assistive device: Walker-rolling Max distance: 8f   Walk 10 feet activity   Assist  Walk 10 feet activity did not occur: Safety/medical concerns  Assist level: Contact Guard/Touching assist Assistive device: Walker-rolling   Walk 50 feet activity   Assist Walk 50 feet with 2 turns activity did not occur: Safety/medical concerns (nausea)  Assist level: Contact Guard/Touching assist Assistive device: Walker-rolling    Walk 150 feet activity   Assist Walk 150 feet activity did not occur: Safety/medical concerns (nausea)         Walk 10 feet on uneven surface  activity   Assist Walk 10 feet on uneven surfaces activity did not occur: Safety/medical concerns (nausea)         Wheelchair     Assist Is the patient using a wheelchair?: Yes Type of Wheelchair: Manual Wheelchair activity did not occur: Safety/medical concerns (unable d/t increased dizziness and nausea from BPPV)         Wheelchair 50 feet with 2 turns activity    Assist    Wheelchair 50 feet with 2 turns activity did not occur: Safety/medical concerns       Wheelchair 150 feet activity     Assist  Wheelchair 150 feet activity did not occur: Safety/medical concerns       Blood pressure 102/79, pulse 91, temperature 98.7 F (37.1 C), resp. rate 16, height 5' 7.5" (1.715 m), weight 66.2 kg, SpO2 98 %.  Medical Problem List and Plan: 1. 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  Antithrombotics: -DVT/anticoagulation:  Pharmaceutical: Eliquis             -antiplatelet therapy: N/A 3. Pain Management: N/A 4. Mood/Behavior/Sleep: LCSW to follow for evaluation and support.              -antipsychotic agents: N/A 5. Neuropsych/cognition: This patient maybe capable of making decisions on her own behalf. 6. Skin/Wound Care: Routine pressure relief measures.  Has some heat rash upper back but macular areas look more med related reviewed  allergies , only med change that corresponds with symptoms is reduction of Cardizem CD from '360mg'$  to '300mg'$  , will consult pharmacy to eval  Now on Diltiazem '30mg'$  QID   Rash is confined to back and post thigh, , likely contact dermatitis, pt states she has sensitive skin and uses hypoallergenic detergent at home  [facility] order hypoallergenic sheets  7. Fluids/Electrolytes/Nutrition: Monitor I/O.      Latest Ref Rng & Units 11/09/2022    5:55 AM 11/08/2022    1:46 AM 11/07/2022    1:33 AM  BMP  Glucose 70 - 99 mg/dL 118  105  118   BUN 8 - 23 mg/dL '20  17  17   '$ Creatinine 0.44 - 1.00 mg/dL 1.22  1.13  1.12   Sodium 135 - 145 mmol/L 139  139  135   Potassium 3.5 - 5.1 mmol/L 3.5  3.7  3.8   Chloride 98 - 111 mmol/L 99  103  101   CO2 22 - 32 mmol/L '28  26  25   '$ Calcium 8.9 - 10.3 mg/dL 9.8  9.2  8.8   Renal fxn stable 12/1 12/11 I personally reviewed all of the patient's labs today -CR trending slowly up (1.42) -will ask pharmacy to review meds. Only med I see which might contribute is eliquis (but that would be a rare effect) -was on ELiquis PTA 8. MVR/A fib: Monitor HR TID--on Eliquis and Cardizem but heart rate seems to be uncontrolled.             --monitor for symptoms with increase in activity.              --plans for CV in the future.              --heart healthy diet. 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       Filed Weights   11/17/22 0500 11/18/22 0500 11/20/22 0500  Weight: 66.7 kg 66 kg 66.2 kg  Weights balanced Crackles in lungs may be atelectasis but will order CXR to eval for pulm edema    9. HTN: Monitor BP TID--on Cardizem and Lasix-->recommendations to d/c Lasix and start aldactone in am.              --lasix to be used prn overload.  12/11 bp controlled on cardizem '75mg'$  qid Still with orthostasis will reduce cardizem dose 12/13 to '30mg'$  q6 h Vitals:   11/22/22 0419 11/22/22 0701  BP: (!) 89/66 102/79  Pulse: 64 91  Resp: 16 16  Temp: 98.7 F (37.1 C)   SpO2: 100% 98%    10. Leucocytosis:  Monitor for fevers and other signs of infection.             12/2 no signs at present     Latest Ref Rng & Units 11/09/2022    5:55 AM 11/08/2022    1:46 AM 11/07/2022    1:33 AM  CBC  WBC 4.0 - 10.5 K/uL 14.7  14.2  11.8   Hemoglobin 12.0 - 15.0 g/dL 13.5  13.1  11.7   Hematocrit 36.0 - 46.0 % 42.1  40.8  37.2   Platelets 150 - 400 K/uL 367  312  287     11. Acute renal failure:  SCr 0.98-->1.25-->1.13 improved       Latest Ref Rng & Units 11/20/2022    5:27 AM 11/19/2022    6:13 AM 11/14/2022    5:59 AM  BMP  Glucose 70 -  99 mg/dL 116  112  107   BUN 8 - 23 mg/dL '18  18  31   '$ Creatinine 0.44 - 1.00 mg/dL 1.21  1.42  1.26   Sodium 135 - 145 mmol/L 139  139  137   Potassium 3.5 - 5.1 mmol/L 3.7  4.5  3.6   Chloride 98 - 111 mmol/L 103  102  101   CO2 22 - 32 mmol/L '27  26  26   '$ Calcium 8.9 - 10.3 mg/dL 9.8  9.9  9.7    Hold off on restarting aldactone, will see BP response to cardizem reduction , enc fluid, now on thin liq  12/11 see #7 above 12. CAP: Has completed antibiotic regimen 11/05/22.             --monitor for hypoxia/dyspnea-->denies respiratory symptoms.  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 No symptoms?  14. Constipation   BM qd    LOS: 14 days A FACE TO FACE EVALUATION WAS  PERFORMED  Charlett Blake 11/22/2022, 8:12 AM

## 2022-11-22 NOTE — Progress Notes (Signed)
Physical Therapy Session Note  Patient Details  Name: Leslie [name] MRN: [id number] Date of Birth: 1941-03-10  Today's Date: 11/22/2022 PT Individual Time: 0950-1045 PT Individual Time Calculation (min): 55 min   Short Term Goals: Week 2:  PT Short Term Goal 1 (Week 2): = to LTGs based on ELOS  Skilled Therapeutic Interventions/Progress Updates:    Pt received sitting in w/c stating she is ready for therapy session and pt already wearing abdominal binder, thigh high TED hose, and ACE wraps.  Transported to/from gym in w/c for time management and energy conservation.  Vitals to start session: BP 108/68 (MAP 81)  Sit>stand w/c>RW with CGA/light min assist for steadying due to minor posterior lean while rising and pt pulling up on RW instead of pushing of from w/c armrest - cuing throughout for improvement and pt coming to stand with CGA more consistently.  Gait training 23f using RW with CGA progressed to light min assist towards end due to pt reporting significant fatigue and needing to sit - achieves reciprocal stepping pattern, decreased gait speed though adequate, and no significant instability noted.  Vitals after gait: BP 77/52 (MAP 61), HR 67bpm with pt reporting significant nausea symptoms . Performed seated UE overhead exercises for BP recovery and reassessed after 3-4 minutes:BP 102/59 (MAP 73), HR 64bpm   Gait training 640fusing RW with CGA and pt with no reports of fatigue nor nausea during gait indicating BP likely more stable but reassessed once returned to sitting: BP 92/53 (MAP 66), HR 88bpm   Pt reports that every so often her heart feels like it is "galloping like a horse" and pt states she can feel when her heart goes back into sinus rhythm and she feels much better then.  Stair navigation training ascending/descending 4 steps (6" height) using B HRs with CGA - step-to pattern leading with R LE on ascent and L LE on descent - no instability noted and no reports of  nausea  Vitals after stairs: BP 104/64 (MAP 75), HR 76bpm   Gait training 2055f 81f49f ADL apartment using RW to prepare for home set-up with CGA and pt having to move more slowly and increase attention on AD management in this environment but no noticeable increase in instability noted. Vitals after 2nd walk: BP 94/75 (MAP 82), HR 75bpm   Transported back to her room and pt agreeable to remain sitting up in w/c - left with needs in reach, BP management items still donned, and seat belt alarm on.   Therapy Documentation Precautions:  Precautions Precautions: Fall Precaution Comments: dys3, nectar, BPPV, L inattention Restrictions Weight Bearing Restrictions: No   Pain:  Reports her legs are "sore" but does not limit participation.   Therapy/Group: Individual Therapy  CarlTawana ScaleT, DPT, NCS, CSRS 11/22/2022, 7:54 AM

## 2022-11-22 NOTE — Progress Notes (Signed)
Speech Language Pathology Daily Session Note  Patient Details  Name: Leslie [name] MRN: [id number] Date of Birth: [birth year]  Today's Date: 11/22/2022 SLP Individual Time: 6394-3200 SLP Individual Time Calculation (min): 62 min  Short Term Goals: Week 2: SLP Short Term Goal 1 (Week 2): STG=LTG due to ELOS  Skilled Therapeutic Interventions: Skilled ST treatment focused on cognitive goals. Pt was greeted upright in wheelchair and complained of some nausea. SLP provided pt with gingerale in which pt reported was helpful to reduce nausea during session. SLP facilitated a thought organization and verbal reasoning task with overall sup A for verbal reasoning, and min-to-mod A verbal redirection cues for sustained attention, and sup A verbal cues for use of external aid for working memory. Pt was transferred to bed at end of session with CGA and left with alarm activated and immediate needs within reach at end of session. Continue per current plan of care.      Pain Pain Assessment Pain Scale: 0-10 Pain Score: 7  Pain Type: Acute pain Pain Location: Hip Pain Orientation: Right Pain Descriptors / Indicators: Aching Pain Intervention(s): Refused  Therapy/Group: Individual Therapy  Patty Sermons 11/22/2022, 2:40 PM

## 2022-11-22 NOTE — Progress Notes (Signed)
Physical Therapy Session Note  Patient Details  Name: Leslie [name] MRN: [id number] Date of Birth: October 01, 1941  Today's Date: 11/22/2022 PT Individual Time: 9694-0982 PT Individual Time Calculation (min): 30 min   Short Term Goals: Week 1:  PT Short Term Goal 1 (Week 1): Pt will initiate BPPV retraining. PT Short Term Goal 1 - Progress (Week 1): Progressing toward goal PT Short Term Goal 2 (Week 1): Pt will perform bed mobility with overall supervision. PT Short Term Goal 2 - Progress (Week 1): Met PT Short Term Goal 3 (Week 1): Pt will initiate standing transfers with overall CGA and reduced dizziness and nausea. PT Short Term Goal 3 - Progress (Week 1): Met PT Short Term Goal 4 (Week 1): Pt will initiate gait training. PT Short Term Goal 4 - Progress (Week 1): Met PT Short Term Goal 5 (Week 1): Pt will initiate stair training. PT Short Term Goal 5 - Progress (Week 1): Progressing toward goal Week 2:  PT Short Term Goal 1 (Week 2): = to LTGs based on ELOS   Skilled Therapeutic Interventions/Progress Updates:   Pt received supine in bed and agreeable to PT. Donning Bil ted hose and ace wrap for BP support while pt in supine with total A. Supine>sit transfer with supervision assist to EOB. Binder donned whlie pt EOB. No nausea noted in sitting. Pt performed stand pivot transfer to [facility] with min assist and RW for safety. Notes "nausea" in transfer. Additional sit<>stand from [facility] to pull pants to waist. Pt left sitting in [facility] with call bell in reach and all needs met.   Semireclined in bed BP 118/100. HR 68  Attempted BPin standing but unable to remain on feet long enough. BP measurement completed as pt sitting in WC 98/58HR 125.  Seated BP after 2 min. 99/58. HR 65.        Therapy Documentation Precautions:  Precautions Precautions: Fall Precaution Comments: dys3, nectar, BPPV, L inattention Restrictions Weight Bearing Restrictions: No    Vital Signs: Therapy Vitals Pulse  Rate: 91 Resp: 16 BP: 102/79 Patient Position (if appropriate): Lying Oxygen Therapy SpO2: 98 % O2 Device: Room Air Pain: denies    Therapy/Group: Individual Therapy  [name] 11/22/2022, 9:20 AM

## 2022-11-22 NOTE — Progress Notes (Addendum)
Patient ID: Leslie [name], female   DOB: [birth year], 81 y.o.   MRN: [id number]  Hospital bed,suction kit,Bedside Commode, Wheelchair and RW ordered through [facility].

## 2022-11-23 ENCOUNTER — Encounter (HOSPITAL_COMMUNITY): Payer: Self-pay | Admitting: Physical Medicine & Rehabilitation

## 2022-11-23 ENCOUNTER — Encounter (HOSPITAL_COMMUNITY): Payer: Self-pay

## 2022-11-23 ENCOUNTER — Inpatient Hospital Stay (HOSPITAL_COMMUNITY)
Admission: EM | Admit: 2022-11-23 | Discharge: 2022-12-10 | DRG: 309 | Disposition: A | Discharge status: Rehabilitation Facility | Payer: Medicare HMO | Source: Intra-hospital | Attending: Family Medicine | Admitting: Family Medicine

## 2022-11-23 DIAGNOSIS — J440 Chronic obstructive pulmonary disease with acute lower respiratory infection: Secondary | ICD-10-CM | POA: Diagnosis present

## 2022-11-23 DIAGNOSIS — Z90710 Acquired absence of both cervix and uterus: Secondary | ICD-10-CM | POA: Diagnosis not present

## 2022-11-23 DIAGNOSIS — R251 Tremor, unspecified: Secondary | ICD-10-CM | POA: Diagnosis present

## 2022-11-23 DIAGNOSIS — E785 Hyperlipidemia, unspecified: Secondary | ICD-10-CM | POA: Diagnosis present

## 2022-11-23 DIAGNOSIS — R4182 Altered mental status, unspecified: Secondary | ICD-10-CM | POA: Diagnosis not present

## 2022-11-23 DIAGNOSIS — Z981 Arthrodesis status: Secondary | ICD-10-CM

## 2022-11-23 DIAGNOSIS — E1122 Type 2 diabetes mellitus with diabetic chronic kidney disease: Secondary | ICD-10-CM | POA: Diagnosis present

## 2022-11-23 DIAGNOSIS — Z79899 Other long term (current) drug therapy: Secondary | ICD-10-CM | POA: Diagnosis not present

## 2022-11-23 DIAGNOSIS — I2510 Atherosclerotic heart disease of native coronary artery without angina pectoris: Secondary | ICD-10-CM | POA: Diagnosis present

## 2022-11-23 DIAGNOSIS — E039 Hypothyroidism, unspecified: Secondary | ICD-10-CM | POA: Diagnosis not present

## 2022-11-23 DIAGNOSIS — Z515 Encounter for palliative care: Secondary | ICD-10-CM

## 2022-11-23 DIAGNOSIS — Z8261 Family history of arthritis: Secondary | ICD-10-CM

## 2022-11-23 DIAGNOSIS — I69392 Facial weakness following cerebral infarction: Secondary | ICD-10-CM | POA: Diagnosis not present

## 2022-11-23 DIAGNOSIS — I080 Rheumatic disorders of both mitral and aortic valves: Secondary | ICD-10-CM | POA: Diagnosis present

## 2022-11-23 DIAGNOSIS — I69391 Dysphagia following cerebral infarction: Secondary | ICD-10-CM

## 2022-11-23 DIAGNOSIS — I639 Cerebral infarction, unspecified: Secondary | ICD-10-CM | POA: Diagnosis not present

## 2022-11-23 DIAGNOSIS — I509 Heart failure, unspecified: Secondary | ICD-10-CM | POA: Diagnosis not present

## 2022-11-23 DIAGNOSIS — E538 Deficiency of other specified B group vitamins: Secondary | ICD-10-CM | POA: Diagnosis not present

## 2022-11-23 DIAGNOSIS — I959 Hypotension, unspecified: Secondary | ICD-10-CM | POA: Diagnosis present

## 2022-11-23 DIAGNOSIS — I429 Cardiomyopathy, unspecified: Secondary | ICD-10-CM | POA: Diagnosis present

## 2022-11-23 DIAGNOSIS — Z87891 Personal history of nicotine dependence: Secondary | ICD-10-CM | POA: Diagnosis not present

## 2022-11-23 DIAGNOSIS — I4892 Unspecified atrial flutter: Secondary | ICD-10-CM | POA: Diagnosis present

## 2022-11-23 DIAGNOSIS — N179 Acute kidney failure, unspecified: Secondary | ICD-10-CM | POA: Diagnosis not present

## 2022-11-23 DIAGNOSIS — I4819 Other persistent atrial fibrillation: Secondary | ICD-10-CM | POA: Diagnosis present

## 2022-11-23 DIAGNOSIS — I6930 Unspecified sequelae of cerebral infarction: Secondary | ICD-10-CM | POA: Diagnosis not present

## 2022-11-23 DIAGNOSIS — N189 Chronic kidney disease, unspecified: Secondary | ICD-10-CM | POA: Diagnosis not present

## 2022-11-23 DIAGNOSIS — R110 Nausea: Secondary | ICD-10-CM | POA: Diagnosis not present

## 2022-11-23 DIAGNOSIS — I130 Hypertensive heart and chronic kidney disease with heart failure and stage 1 through stage 4 chronic kidney disease, or unspecified chronic kidney disease: Secondary | ICD-10-CM | POA: Diagnosis present

## 2022-11-23 DIAGNOSIS — I5022 Chronic systolic (congestive) heart failure: Secondary | ICD-10-CM | POA: Diagnosis present

## 2022-11-23 DIAGNOSIS — J439 Emphysema, unspecified: Secondary | ICD-10-CM | POA: Diagnosis not present

## 2022-11-23 DIAGNOSIS — Z888 Allergy status to other drugs, medicaments and biological substances status: Secondary | ICD-10-CM

## 2022-11-23 DIAGNOSIS — E861 Hypovolemia: Secondary | ICD-10-CM | POA: Diagnosis present

## 2022-11-23 DIAGNOSIS — J449 Chronic obstructive pulmonary disease, unspecified: Secondary | ICD-10-CM | POA: Diagnosis present

## 2022-11-23 DIAGNOSIS — I5020 Unspecified systolic (congestive) heart failure: Secondary | ICD-10-CM | POA: Diagnosis not present

## 2022-11-23 DIAGNOSIS — I63031 Cerebral infarction due to thrombosis of right carotid artery: Secondary | ICD-10-CM | POA: Diagnosis not present

## 2022-11-23 DIAGNOSIS — L8990 Pressure ulcer of unspecified site, unspecified stage: Secondary | ICD-10-CM | POA: Insufficient documentation

## 2022-11-23 DIAGNOSIS — Z8249 Family history of ischemic heart disease and other diseases of the circulatory system: Secondary | ICD-10-CM

## 2022-11-23 DIAGNOSIS — R1310 Dysphagia, unspecified: Secondary | ICD-10-CM | POA: Diagnosis present

## 2022-11-23 DIAGNOSIS — I4891 Unspecified atrial fibrillation: Secondary | ICD-10-CM | POA: Diagnosis present

## 2022-11-23 DIAGNOSIS — N183 Chronic kidney disease, stage 3 unspecified: Secondary | ICD-10-CM | POA: Diagnosis present

## 2022-11-23 DIAGNOSIS — E43 Unspecified severe protein-calorie malnutrition: Secondary | ICD-10-CM | POA: Diagnosis not present

## 2022-11-23 DIAGNOSIS — Z801 Family history of malignant neoplasm of trachea, bronchus and lung: Secondary | ICD-10-CM

## 2022-11-23 DIAGNOSIS — K219 Gastro-esophageal reflux disease without esophagitis: Secondary | ICD-10-CM | POA: Diagnosis present

## 2022-11-23 DIAGNOSIS — N390 Urinary tract infection, site not specified: Secondary | ICD-10-CM | POA: Diagnosis not present

## 2022-11-23 DIAGNOSIS — I3139 Other pericardial effusion (noninflammatory): Secondary | ICD-10-CM | POA: Diagnosis present

## 2022-11-23 DIAGNOSIS — I69354 Hemiplegia and hemiparesis following cerebral infarction affecting left non-dominant side: Secondary | ICD-10-CM

## 2022-11-23 DIAGNOSIS — I69322 Dysarthria following cerebral infarction: Secondary | ICD-10-CM

## 2022-11-23 DIAGNOSIS — B952 Enterococcus as the cause of diseases classified elsewhere: Secondary | ICD-10-CM | POA: Diagnosis not present

## 2022-11-23 DIAGNOSIS — I700 Atherosclerosis of aorta: Secondary | ICD-10-CM | POA: Diagnosis present

## 2022-11-23 DIAGNOSIS — I6329 Cerebral infarction due to unspecified occlusion or stenosis of other precerebral arteries: Secondary | ICD-10-CM | POA: Diagnosis not present

## 2022-11-23 DIAGNOSIS — Z833 Family history of diabetes mellitus: Secondary | ICD-10-CM

## 2022-11-23 DIAGNOSIS — I480 Paroxysmal atrial fibrillation: Secondary | ICD-10-CM | POA: Diagnosis not present

## 2022-11-23 DIAGNOSIS — I340 Nonrheumatic mitral (valve) insufficiency: Secondary | ICD-10-CM | POA: Diagnosis not present

## 2022-11-23 DIAGNOSIS — K5900 Constipation, unspecified: Secondary | ICD-10-CM | POA: Diagnosis not present

## 2022-11-23 DIAGNOSIS — I951 Orthostatic hypotension: Secondary | ICD-10-CM | POA: Diagnosis not present

## 2022-11-23 DIAGNOSIS — R404 Transient alteration of awareness: Secondary | ICD-10-CM | POA: Diagnosis not present

## 2022-11-23 DIAGNOSIS — Z96642 Presence of left artificial hip joint: Secondary | ICD-10-CM | POA: Diagnosis present

## 2022-11-23 DIAGNOSIS — Z7189 Other specified counseling: Secondary | ICD-10-CM | POA: Diagnosis not present

## 2022-11-23 DIAGNOSIS — I6601 Occlusion and stenosis of right middle cerebral artery: Secondary | ICD-10-CM | POA: Diagnosis not present

## 2022-11-23 DIAGNOSIS — Z7901 Long term (current) use of anticoagulants: Secondary | ICD-10-CM

## 2022-11-23 DIAGNOSIS — Z79890 Hormone replacement therapy (postmenopausal): Secondary | ICD-10-CM

## 2022-11-23 DIAGNOSIS — I69398 Other sequelae of cerebral infarction: Secondary | ICD-10-CM | POA: Diagnosis not present

## 2022-11-23 LAB — BASIC METABOLIC PANEL
Anion gap: 8 (ref 5–15)
BUN: 19 mg/dL (ref 8–23)
CO2: 27 mmol/L (ref 22–32)
Calcium: 9.8 mg/dL (ref 8.9–10.3)
Chloride: 103 mmol/L (ref 98–111)
Creatinine, Ser: 1.36 mg/dL — ABNORMAL HIGH (ref 0.44–1.00)
GFR, Estimated: 39 mL/min — ABNORMAL LOW (ref >60)
Glucose, Bld: 134 mg/dL — ABNORMAL HIGH (ref 70–99)
Potassium: 3.8 mmol/L (ref 3.5–5.1)
Sodium: 138 mmol/L (ref 135–145)

## 2022-11-23 LAB — TSH: TSH: 10.315 u[IU]/mL — ABNORMAL HIGH (ref 0.350–4.500)

## 2022-11-23 LAB — MAGNESIUM: Magnesium: 2.1 mg/dL (ref 1.7–2.4)

## 2022-11-23 MED ORDER — SODIUM CHLORIDE 0.9 % IV BOLUS
500.0000 mL | Freq: Once | INTRAVENOUS | Status: AC
  Administered 2022-11-23: 500 mL via INTRAVENOUS

## 2022-11-23 MED ORDER — SENNOSIDES-DOCUSATE SODIUM 8.6-50 MG PO TABS
2.0000 | ORAL_TABLET | Freq: Two times a day (BID) | ORAL | Status: DC
  Administered 2022-11-23 – 2022-12-10 (×25): 2 via ORAL
  Filled 2022-11-23 (×27): qty 2

## 2022-11-23 MED ORDER — GUAIFENESIN-DM 100-10 MG/5ML PO SYRP: 5.0000 mL | ORAL_SOLUTION | Freq: Four times a day (QID) | ORAL | Status: DC | PRN

## 2022-11-23 MED ORDER — AMIODARONE HCL IN DEXTROSE 360-4.14 MG/200ML-% IV SOLN
60.0000 mg/h | INTRAVENOUS | Status: DC
  Administered 2022-11-23 (×2): 60 mg/h via INTRAVENOUS

## 2022-11-23 MED ORDER — ACETAMINOPHEN 325 MG PO TABS
325.0000 mg | ORAL_TABLET | ORAL | Status: DC | PRN
  Administered 2022-11-26 – 2022-12-07 (×4): 650 mg via ORAL
  Filled 2022-11-23 (×4): qty 2

## 2022-11-23 MED ORDER — VITAMIN B-12 1000 MCG PO TABS
1000.0000 ug | ORAL_TABLET | Freq: Every day | ORAL | Status: DC
  Administered 2022-11-24 – 2022-12-10 (×17): 1000 ug via ORAL
  Filled 2022-11-23 (×18): qty 1

## 2022-11-23 MED ORDER — ORAL CARE MOUTH RINSE: 15.0000 mL | OROMUCOSAL | Status: DC | PRN

## 2022-11-23 MED ORDER — AMIODARONE HCL IN DEXTROSE 360-4.14 MG/200ML-% IV SOLN
30.0000 mg/h | INTRAVENOUS | Status: DC
  Administered 2022-11-24 – 2022-11-26 (×7): 30 mg/h via INTRAVENOUS
  Filled 2022-11-23 (×8): qty 200

## 2022-11-23 MED ORDER — PROCHLORPERAZINE 25 MG RE SUPP: 12.5000 mg | Freq: Four times a day (QID) | RECTAL | Status: DC | PRN

## 2022-11-23 MED ORDER — AMIODARONE HCL IN DEXTROSE 360-4.14 MG/200ML-% IV SOLN
60.0000 mg/h | INTRAVENOUS | Status: DC
  Administered 2022-11-23: 60 mg/h via INTRAVENOUS
  Filled 2022-11-23: qty 200

## 2022-11-23 MED ORDER — SORBITOL 70 % SOLN: 60.0000 mL | Freq: Every day | Status: DC | PRN

## 2022-11-23 MED ORDER — ROSUVASTATIN CALCIUM 5 MG PO TABS
10.0000 mg | ORAL_TABLET | Freq: Every day | ORAL | Status: DC
  Administered 2022-11-24 – 2022-12-10 (×17): 10 mg via ORAL
  Filled 2022-11-23 (×19): qty 2

## 2022-11-23 MED ORDER — IPRATROPIUM-ALBUTEROL 0.5-2.5 (3) MG/3ML IN SOLN: 3.0000 mL | RESPIRATORY_TRACT | Status: DC | PRN

## 2022-11-23 MED ORDER — BISACODYL 10 MG RE SUPP
10.0000 mg | Freq: Every day | RECTAL | Status: DC | PRN
  Filled 2022-11-23: qty 1

## 2022-11-23 MED ORDER — ALUM & MAG HYDROXIDE-SIMETH 200-200-20 MG/5ML PO SUSP: 30.0000 mL | ORAL | Status: DC | PRN

## 2022-11-23 MED ORDER — FLEET ENEMA 7-19 GM/118ML RE ENEM: 1.0000 | ENEMA | Freq: Once | RECTAL | Status: DC | PRN

## 2022-11-23 MED ORDER — MELATONIN 5 MG PO TABS
5.0000 mg | ORAL_TABLET | Freq: Every evening | ORAL | Status: DC | PRN
  Administered 2022-11-24 – 2022-12-09 (×9): 5 mg via ORAL
  Filled 2022-11-23 (×10): qty 1

## 2022-11-23 MED ORDER — ORAL CARE MOUTH RINSE
15.0000 mL | OROMUCOSAL | Status: DC
  Administered 2022-11-23 – 2022-12-10 (×56): 15 mL via OROMUCOSAL

## 2022-11-23 MED ORDER — DILTIAZEM HCL 30 MG PO TABS: 30.0000 mg | ORAL_TABLET | Freq: Three times a day (TID) | ORAL | Status: DC

## 2022-11-23 MED ORDER — AMIODARONE HCL IN DEXTROSE 360-4.14 MG/200ML-% IV SOLN
30.0000 mg/h | INTRAVENOUS | Status: DC
  Filled 2022-11-23: qty 200

## 2022-11-23 MED ORDER — CAMPHOR-MENTHOL 0.5-0.5 % EX LOTN: TOPICAL_LOTION | CUTANEOUS | Status: DC | PRN

## 2022-11-23 MED ORDER — APIXABAN 5 MG PO TABS
5.0000 mg | ORAL_TABLET | Freq: Two times a day (BID) | ORAL | Status: DC
  Administered 2022-11-23 – 2022-12-10 (×34): 5 mg via ORAL
  Filled 2022-11-23 (×34): qty 1

## 2022-11-23 MED ORDER — FAMOTIDINE 20 MG PO TABS
20.0000 mg | ORAL_TABLET | Freq: Every day | ORAL | Status: DC
  Administered 2022-11-24 – 2022-12-10 (×17): 20 mg via ORAL
  Filled 2022-11-23 (×19): qty 1

## 2022-11-23 MED ORDER — PROCHLORPERAZINE EDISYLATE 10 MG/2ML IJ SOLN
5.0000 mg | Freq: Four times a day (QID) | INTRAMUSCULAR | Status: DC | PRN
  Administered 2022-11-28: 5 mg via INTRAMUSCULAR
  Administered 2022-12-01 – 2022-12-02 (×3): 10 mg via INTRAMUSCULAR
  Filled 2022-11-23 (×6): qty 2

## 2022-11-23 MED ORDER — CALCIUM CARBONATE ANTACID 500 MG PO CHEW: 1.0000 | CHEWABLE_TABLET | Freq: Three times a day (TID) | ORAL | Status: DC | PRN

## 2022-11-23 MED ORDER — AMIODARONE LOAD VIA INFUSION
150.0000 mg | Freq: Once | INTRAVENOUS | Status: AC
  Administered 2022-11-23: 150 mg via INTRAVENOUS
  Filled 2022-11-23: qty 83.34

## 2022-11-23 MED ORDER — PROCHLORPERAZINE MALEATE 5 MG PO TABS
5.0000 mg | ORAL_TABLET | Freq: Four times a day (QID) | ORAL | Status: DC | PRN
  Administered 2022-11-30 – 2022-12-10 (×3): 5 mg via ORAL
  Filled 2022-11-23: qty 2
  Filled 2022-11-23: qty 1
  Filled 2022-11-23 (×3): qty 2

## 2022-11-23 MED ORDER — LEVOTHYROXINE SODIUM 88 MCG PO TABS
88.0000 ug | ORAL_TABLET | Freq: Every day | ORAL | Status: DC
  Administered 2022-11-24 – 2022-11-25 (×2): 88 ug via ORAL
  Filled 2022-11-23 (×2): qty 1

## 2022-11-23 MED ORDER — GERHARDT'S BUTT CREAM
TOPICAL_CREAM | Freq: Two times a day (BID) | CUTANEOUS | Status: DC
  Administered 2022-11-25 – 2022-12-07 (×2): 1 via TOPICAL
  Filled 2022-11-23 (×2): qty 1

## 2022-11-23 MED ORDER — ENSURE ENLIVE PO LIQD
237.0000 mL | Freq: Three times a day (TID) | ORAL | Status: DC
  Administered 2022-11-23 – 2022-12-10 (×32): 237 mL via ORAL
  Filled 2022-11-23: qty 237

## 2022-11-23 NOTE — Progress Notes (Signed)
Patient with weakness and hypotension. Found to have rapid A fib with soft BP. Normal saline bolus ordered. EKG showed HR in 150-160's range. Rapid RN-Helle contact for assist. Contacted Dr. Warren Lacy who recommended transfer to acute with [facility] to admit.  Dr. Fuller Plan will admit to acute with cards to follow for input. Daughter updated.

## 2022-11-23 NOTE — Progress Notes (Signed)
Patient ID: Leslie [name], female   DOB: 05-Aug-1941, 81 y.o.   MRN: [id number]  SW met with patient, daughter and SIL in room to provide [facility] resources. Family anticipates assisting patient initially but potentially will arrange services in the future.

## 2022-11-23 NOTE — Progress Notes (Signed)
Physical Therapy Session Note  Patient Details  Name: Kateryn Marasigan MRN: [id number] Date of Birth: 03/25/1941  Today's Date: 11/23/2022 PT Individual Time: 1120-1200 and 1405-1500 PT Individual Time Calculation (min): 40 min and 55 min    Short Term Goals: Week 1:  PT Short Term Goal 1 (Week 1): Pt will initiate BPPV retraining. PT Short Term Goal 1 - Progress (Week 1): Progressing toward goal PT Short Term Goal 2 (Week 1): Pt will perform bed mobility with overall supervision. PT Short Term Goal 2 - Progress (Week 1): Met PT Short Term Goal 3 (Week 1): Pt will initiate standing transfers with overall CGA and reduced dizziness and nausea. PT Short Term Goal 3 - Progress (Week 1): Met PT Short Term Goal 4 (Week 1): Pt will initiate gait training. PT Short Term Goal 4 - Progress (Week 1): Met PT Short Term Goal 5 (Week 1): Pt will initiate stair training. PT Short Term Goal 5 - Progress (Week 1): Progressing toward goal Week 2:  PT Short Term Goal 1 (Week 2): = to LTGs based on ELOS Week 3:     Skilled Therapeutic Interventions/Progress Updates:   Session 1.  Family education with daughter and family friend. Pt transported to rehab gym in [facility]. Gait training with CGA from PT x 17f with cues for posture and safety in turns. Stair management with BUE support on Rails with CGA for safety and cues for fmaily to guard below patients. Step management on 4inch step with RW performed x 2 with min assist for safety and mod cues for set up to protect back and reduce fall. Risk. Car transfer with min assist from PT to van height to simulate car access at home and min cues for AD management in turn. WC mobility through hall x 1021fwith supervision assist . Additional gait training with daughter x 5036fnd min assist to bed. Pt returned to room and performed ambulatory transfer to bed with min assist. Sit>supine completed with supervision assist, and left supine in bed with call bell in reach and  all needs met.    Session 2.  Pt received supine in bed and agreeable to PT. Supine>sit transfer with supervision assist form partially elevated position. Donning abdominal binder sitting EOB. Stand pivot transfer to [facility].  [facility] Wnad min assist x [id number] mild s/s of hyptension on first bout.seated rest break of 2 min for s/s to resolve. Pt performed 5 time sit<>stand (5xSTS): 42 sec (>15 sec indicates increased fall risk; average of 3 trials.)  WC mobility with supervision assist on concrete and in hall of rehab unit x 100ft 39f120ft r45fctively Patient returned to room and [facility] reach and all needs met.          Therapy Documentation Precautions:  Precautions Precautions: Fall Precaution Comments: dys3, nectar, BPPV, L inattention Restrictions Weight Bearing Restrictions: No   Therapy/Group: Individual Therapy  Jadrien Narine [name]2023, 6:04 PM

## 2022-11-23 NOTE — H&P (Signed)
History and Physical    Patient: Leslie [name] NAT:[id number] DOB: 03/09/1941 DOA: (Not on file) DOS: the patient was seen and examined on 11/23/2022 PCP: Sonia Side., FNP  Patient coming from: Transfer from inpatient rehab  Chief Complaint: Hypotension and atrial flutter  HPI: Leslie [name] is a 81 y.o. female with medical history significant of CVA, atrial flutter fibrillation/flutter, hypothyroidism, GERD who is being transferred back to inpatient side after being found to be in atrial flutter with heart rates into the 160s and low blood pressures.   Just recently been hospitalized from 11/20-11/30, after being found to be in atrial fibrillation with RVR after being seen by primary care provider for lightheadedness.  She was started on Cardizem drip and anticoagulation.  CT of the chest had noted right layering pleural effusion and right upper lobe pneumonia.  Patient was treated with Rocephin and azithromycin which was completed on 11/27.  Thoracentesis was performed which noted per monitoring fusion.  Echocardiogram noted EF to be 40- 45% with LV global hypokinesis.  Patient had been tried on metoprolol but reported shortness of breath for which it was discontinued.  Patient was placed on Cardizem and transition to Eliquis with plans for outpatient cardioversion.  However patient found to have left-sided hemiparesis and left-sided neglect on 11/25.  Imaging significant for right M1 occlusion, and patient underwent cerebral angiogram with thrombectomy of the right MCA occluded segment with complete revascularization.  MRI revealed a small acute infarct of the right frontal, temporal lobe, and caudate body with chronic small vessel disease.  Neurology had thought symptoms were secondary to atrial fibrillation.  Seems that during her hospitalization amiodarone had been utilized to convert the previously during her hospital stay.  Cardizem has been titrated back up and patient was  discharged to inpatient rehab.  Since admitted into inpatient rehab patient had been having issues hypotension for which Cardizem CD was reduced from 360 mg to 300 mg.  They had been wrapping the patient's legs as well as placing an abdominal binder without significant improvement.  Cardizem dosing had been changed to 75 mg 4 times daily, then 60 mg 4 times daily, and currently she was on 30 mg 4 times daily.  Despite these changes patient was still noted to be orthostatic.  Patient was noted to have heart rates to the 160s today in atrial flutter with blood pressure as low as 84/50.  Patient was bolused IV fluids with improvement in blood pressure.  Labs today noted potassium 3.8, BUN 19, and creatinine 1.36.  Cardiology was consulted and recommended transferring back to a monitored bed.  Orders placed for progressive bed.  Patient reports that she does not feel well but has not felt well for quite some time.  Denies any fever, chest pain, or shortness of breath.  Currently states that she would like some chicken noodle soup   Review of Systems: As mentioned in the history of present illness. All other systems reviewed and are negative. Past Medical History:  Diagnosis Date   Arthritis    Back pain due to injury    fell off a ladder and fractured back-T12 down (multiple surgeries w/ onging pain)   Chronic kidney disease    "stage 3 kidney failure" improving per pt   Headache    Hypothyroidism    Insomnia    Plantar fasciitis    Stomach ulcer ~2000   Stroke [facility]    TIA X2   2001, jan 2017  Vertigo, benign paroxysmal, bilateral    Vitamin D deficiency    Past Surgical History:  Procedure Laterality Date   ABDOMINAL HYSTERECTOMY  1974   ANKLE FUSION Right 06/07/2016   Procedure: RIGHT TALONAVICULAR ARTHRODESIS ;  Surgeon: Wylene Simmer, MD;  Location: [place];  Service: Orthopedics;  Laterality: Right;   APPENDECTOMY     BACK SURGERY     x4  fusion T12 thru sacrum; U rod in  sacrum   CATARACT EXTRACTION, BILATERAL Bilateral 2015   GASTROCNEMIUS RECESSION Right 06/07/2016   Procedure: RIGHT GASTROC RECESSION ;  Surgeon: Wylene Simmer, MD;  Location: [place];  Service: Orthopedics;  Laterality: Right;   HAND SURGERY Left 2003   x2   IR ANGIO EXTRACRAN SEL COM CAROTID INNOMINATE UNI L MOD SED  11/03/2022   IR CT HEAD LTD  11/03/2022   IR PERCUTANEOUS ART THROMBECTOMY/INFUSION INTRACRANIAL INC DIAG ANGIO  11/03/2022   L heel surgery for torn tendon Left 2015   PLANTAR FASCIA SURGERY Right 2009   surgery at [facility] 11/03/2022   Procedure: IR WITH ANESTHESIA;  Surgeon: Radiologist, Medication, MD;  Location: [place];  Service: Radiology;  Laterality: N/A;   SHOULDER ARTHROSCOPY Right 2007   TENDON REPAIR Right 1949   foot- lacerated tendon in arch of foot   THORACENTESIS N/A 10/30/2022   Procedure: THORACENTESIS;  Surgeon: Julian Hy, DO;  Location: [place];  Service: Cardiopulmonary;  Laterality: N/A;   TOTAL HIP ARTHROPLASTY Left 10/05/2021   Procedure: TOTAL HIP ARTHROPLASTY ANTERIOR APPROACH;  Surgeon: Rod Can, MD;  Location: [facility];  Service: Orthopedics;  Laterality: Left;   Social History:  reports that she quit smoking about 36 years ago. Her smoking use included cigarettes. She has never used smokeless tobacco. She reports that she does not drink alcohol and does not use drugs.  Allergies  Allergen Reactions   Duloxetine Nausea Only   Voltaren [Diclofenac Sodium] Shortness Of Breath and Palpitations   Lisinopril Other (See Comments)   Aspirin Other (See Comments)    ulcers   Diphenhydramine Other (See Comments)   Gabapentin Itching   Metoprolol     Cold sweat/exhaustion    Ultram [Tramadol Hcl] Other (See Comments)    Effects cognitive abilities    Proton Pump Inhibitors Rash    Itching, rash    Family History  Problem Relation Age of Onset   Hypertension Mother    Lung cancer  Mother    Lung cancer Father    Diabetes Sister    Hypertension Sister    Congestive Heart Failure Sister    Arthritis Brother     Prior to Admission medications   Medication Sig Start Date End Date Taking? Authorizing Provider  acetaminophen (TYLENOL) 325 MG tablet Take 1-2 tablets (325-650 mg total) by mouth every 6 (six) hours as needed for mild pain (pain score 1-3 or temp > 100.5). Patient taking differently: Take 500 mg by mouth every 6 (six) hours as needed for mild pain (pain score 1-3 or temp > 100.5). 10/06/21   Dorothyann Peng, PA-C  acetaminophen (TYLENOL) 325 MG tablet Take 1-2 tablets (325-650 mg total) by mouth every 4 (four) hours as needed for mild pain. 11/13/22   Love, Ivan Anchors, PA-C  apixaban (ELIQUIS) 5 MG TABS tablet Take 1 tablet (5 mg total) by mouth 2 (two) times daily. 11/08/22 12/08/22  Arrien, Jimmy Picket, MD  diltiazem (CARDIZEM CD) 360 MG 24  hr capsule Take 1 capsule (360 mg total) by mouth daily. 11/09/22 12/09/22  Arrien, Jimmy Picket, MD  famotidine (PEPCID) 20 MG tablet Take 20 mg by mouth daily as needed for indigestion or heartburn.    [provider]  levothyroxine (SYNTHROID) 88 MCG tablet Take 88 mcg by mouth daily before breakfast.    [provider]  Mouthwashes (MOUTH RINSE) LIQD solution 15 mLs by Mouth Rinse route 4 (four) times daily - after meals and at bedtime. 11/13/22   Love, Ivan Anchors, PA-C  polyethylene glycol (MIRALAX / GLYCOLAX) 17 g packet Take 17 g by mouth daily as needed for severe constipation, moderate constipation or mild constipation. 11/08/22   Arrien, Jimmy Picket, MD  rosuvastatin (CRESTOR) 10 MG tablet Take 1 tablet (10 mg total) by mouth daily. 11/09/22 12/09/22  Arrien, Jimmy Picket, MD  senna (SENOKOT) 8.6 MG TABS tablet Take 1 tablet (8.6 mg total) by mouth 2 (two) times daily. Patient taking differently: Take 1 tablet by mouth 2 (two) times daily as needed for mild constipation or moderate  constipation. 10/06/21   Dorothyann Peng, PA-C  spironolactone (ALDACTONE) 25 MG tablet Take 1 tablet (25 mg total) by mouth daily. 11/09/22 12/09/22  Arrien, Jimmy Picket, MD    Physical Exam: There were no vitals filed for this visit.  Constitutional: Elderly female who appears chronically ill Eyes: PERRL, lids and conjunctivae normal ENMT: Mucous membranes are moist. Posterior pharynx clear of any exudate or lesions.Normal dentition.  Neck: normal, supple, no masses, no thyromegaly Respiratory: Normal respiratory effort without significant wheezes or rhonchi.  O2 saturations appear to be maintained on room air. Cardiovascular: Irregular and tachycardic.  No lower extremity edema appreciated. Abdomen: no tenderness, no masses palpated. No hepatosplenomegaly. Bowel sounds positive.  Musculoskeletal: no clubbing / cyanosis . Good ROM, no contractures. Normal muscle tone.  Skin: no rashes, lesions, ulcers. No induration Neurologic: CN 2-12 grossly intact.  Mild dysarthria Psychiatric:  Alert and oriented x 3  Data Reviewed:  EKG revealed atrial fibrillation 159 bpm  Assessment and Plan:  Atrial fibrillation with RVR Patient has been dealing with low blood pressures for which she had been transition from Cardizem CD 360 mg down to Cardizem 30 mg every 6 hours.  In doing so patient's heart rates became uncontrolled and she was noted to still be hypotensive.  Appears during previous hospitalization amiodarone had been noted to have converted patient back into a sinus rhythm.  She had not tolerated metoprolol due to shortness of breath. -Admit to a progressive bed -Discontinued Cardizem -Amiodarone drip per protocol for attempt at rate control -Goal potassium at least 4 and magnesium at least 2 -Continue Eliquis -Appreciate cardiology consultative services, will follow-up for any further recommendations  Hypotension Patient noted to have blood pressures as low as 84/50 today for  which patient has been bolused 500 mL of normal saline IV fluids with improvement in blood pressures. -Continue TED hose and abdominal binder -Goal MAP at least 65 -IV fluid boluses as needed    Heart failure with reduced EF On physical exam patient's lung sounds clear and no significant lower extremity edema appreciated.  Echocardiogram had noted EF to be 40-45 % with global hypokinesis.  Patient was also noted to have moderate to severe mitral regurgitation. -Check I&O's and daily weights  History of CVA with residual deficits Patient had a right middle cerebral artery embolism on 11/25 and underwent thrombectomy. MRI revealed a small acute infarct of the right frontal, temporal  lobe, and caudate body with chronic small vessel disease.  Patient states -PT/OT/speech to eval and treat  COPD Patient without acute exacerbation.  Hypothyroidism -Check TSH -Continue levothyroxine  DVT prophylaxis: Continue Eliquis Advance Care Planning:   Code Status: Full Code    Consults: Cardiology  Family Communication: Daughter updated over the phone Severity of Illness: The appropriate patient status for this patient is INPATIENT. Inpatient status is judged to be reasonable and necessary in order to provide the required intensity of service to ensure the patient's safety. The patient's presenting symptoms, physical exam findings, and initial radiographic and laboratory data in the context of their chronic comorbidities is felt to place them at high risk for further clinical deterioration. Furthermore, it is not anticipated that the patient will be medically stable for discharge from the hospital within 2 midnights of admission.   * I certify that at the point of admission it is my clinical judgment that the patient will require inpatient hospital care spanning beyond 2 midnights from the point of admission due to high intensity of service, high risk for further deterioration and high frequency of  surveillance required.*  Author: Norval Morton, MD 11/23/2022 5:01 PM  For on call review [email or web address].

## 2022-11-23 NOTE — Progress Notes (Signed)
Occupational Therapy Session Note  Patient Details  Name: Leslie [name] MRN: [id number] Date of Birth: 1941/08/21  Today's Date: 11/23/2022 OT Individual Time: 1003-1058 OT Individual Time Calculation (min): 55 min    Short Term Goals: Week 2:  OT Short Term Goal 1 (Week 2): STG=LTG (d/t pt lenght of stay)  Skilled Therapeutic Interventions/Progress Updates:   Family Education Session with daughter.   Patient received reclined supine in bed. Daughter and friend at bedside.   Initial BP:  reclined sitting: 85/69 - Patient reclined to full supine with feet slightly elevated - 92/74.   Applied Ted hose, and ace wraps - educated daughter in both and daughter assisted with application.   Rolled to sidelying then slowly up to sitting - patient unable to tolerate due to nausea.  Attempted breathing exercise, gaze stabilization - unable to reduce symptoms - returned to supine.   5 min rest.   Used bed features to elevate head of bed.  Offered patient ginger ale.    Transitioned from reclined sitting to sitting edge of bed.  Patient without nausea.  Abdominal binder applied - educated daughter on application.   BP Seated at edge of bed:  112/71 Had daughter perform squat pivot transfer toward left with min assist and cueing.    Patient may benefit from hospital bed to assist with bed mobility and to reduce vestibular symptoms during transitional movements.    Patient left up in wheelchair - direct hand off to nurse completing family education.    Therapy Documentation Precautions:  Precautions Precautions: Fall Precaution Comments: dys3, nectar, BPPV, L inattention Restrictions Weight Bearing Restrictions: No   Pain:  Denies pain     Therapy/Group: Individual Therapy  Mariah Milling 11/23/2022, 11:48 AM

## 2022-11-23 NOTE — Progress Notes (Signed)
Amiodarone gtt started per MD order. '150mg'$  Amiod bolused and gtt started at '60mg'$ /hr. Pt SBP prior to bolus 120s, HR-160s. SBP after bolus-90s, HR-120s. Pt transferred to 6E09 with 4W RN.

## 2022-11-23 NOTE — Consult Note (Addendum)
Cardiology Consultation   Patient ID: Leslie [name] MRN: [id number]; DOB: 1941/06/14  Admit date: (Not on file) Date of Consult: 11/23/2022  PCP:  Sonia Side., [place] [facility] Cardiologist:  Peter Martinique, MD   {  Patient Profile:   Leslie [name] is a 81 y.o. 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 Patient was recently admitted from 10/29/2022 to 11/08/2022 for new onset atrial fibrillation and was found to have a reduced EF of 40-45%. Hospitalization was complicated by community acquired pneumonia (which was initially diagnosed prior to this admission) with right pleural effusion s/p thoracentesis on 10/30/2022, and acute stroke requiring thrombectomy. She was discharged to CIR on 11/08/2022. She went back into rapid atrial fibrillation with rates in the 150s on 11/23/2022 and was hypotensive with it. Therefore, she was readmitted as an inpatient and Cardiology consulted for assistance at the request of Dierdre Harness, PA-C.  History of Present Illness:   Leslie [name] is a 81 year female with the above history.  She was recently admitted from 10/29/2022 to 11/08/2022 after being found to be in new onset atrial fibrillation with RVR at her PCPs office. She had recently been diagnosed with community acquired pneumonia and had been treated with antibiotics and and steroids with no significant improvement. She went back to see PCP for follow-up and was found to be in atrial fibrillation with RVR. Therefore, she was advised to go to the ED. Rates were initially in the 150s. Chest x-ray showed right mid and lower lung field infiltrate suggestive of atelectasis vs pneumonia with patchy infiltrates in the left lower lung field suggestive of multifocal pneumonia. Chest CTA  was negative for PE but revealed a right lower lobe consolidation with moderate right pleural effusion compatible with pneumonia. Echo showed LVEF of 40-45% with global hypokinesis, moderate asymmetric LVH of the basal-septal segment and moderate to severe MR. She was started on IV Diltiazem and IV Heparin with improvement in rates and underwent a right sided thoracentesis on 10/30/2022. She was continued on antibiotics. Hospitalization was complicated by an acute stroke on 11/03/2024. Head CT showed right M1 occlusion with poor collateral flow in the MCA territory. She underwent thrombectomy with IR. She was eventually transitioned to PO Diltiazem and started on Eliquis and was discharged to CIR on 11/08/2022.   She was in atrial fib with rate control but today developed RVR with rates in the 150s and was hypotensive with this with systolic BP in the 80D. She was started on an IV fluid bolus. Cardiology was re- consulted. At the time of this evaluation, patient is resting comfortably in no acute distress.  She is in atrial fibrillation with heart rates ranging between 120 and 160s.  She is relatively asymptomatic with this.  Her only complaint at this time is that she is hungry.  She denies any palpitations, lightheadedness, dizziness, near syncope.  No chest pain or shortness of breath.  She states inpatient rehab has been going overall well.  However, upon reviewing notes, it sounds like she has had some orthostatic hypotension. Diltiazem has been reduced and rehab staff has been wrapping legs and using an abdominal binders.  She has no signs of heart failure.  She is laying almost completely flat denies any orthopnea or PND.  No lower extremity edema. She  has been afebrile and denies a cough. She has chronic nausea but no other GI symptoms. She denies any abnormal bleeding.   Past Medical History:  Diagnosis Date   Arthritis    Back pain due to injury    fell off a ladder and fractured back-T12 down  (multiple surgeries w/ onging pain)   Chronic kidney disease    "stage 3 kidney failure" improving per pt   Hypothyroidism    Insomnia    Plantar fasciitis    Stomach ulcer ~2000   Stroke [facility])    TIA X2   2001, jan 2017   Vertigo, benign paroxysmal, bilateral    Vitamin D deficiency     Past Surgical History:  Procedure Laterality Date   ABDOMINAL HYSTERECTOMY  1974   ANKLE FUSION Right 06/07/2016   Procedure: RIGHT TALONAVICULAR ARTHRODESIS ;  Surgeon: Wylene Simmer, MD;  Location: [facility];  Service: Orthopedics;  Laterality: Right;   APPENDECTOMY     BACK SURGERY     x4  fusion T12 thru sacrum; U rod in sacrum   CATARACT EXTRACTION, BILATERAL Bilateral 2015   GASTROCNEMIUS RECESSION Right 06/07/2016   Procedure: RIGHT GASTROC RECESSION ;  Surgeon: Wylene Simmer, MD;  Location: [facility];  Service: Orthopedics;  Laterality: Right;   HAND SURGERY Left 2003   x2   IR ANGIO EXTRACRAN SEL COM CAROTID INNOMINATE UNI L MOD SED  11/03/2022   IR CT HEAD LTD  11/03/2022   IR PERCUTANEOUS ART THROMBECTOMY/INFUSION INTRACRANIAL INC DIAG ANGIO  11/03/2022   L heel surgery for torn tendon Left 2015   PLANTAR FASCIA SURGERY Right 2009   surgery at [facility] 11/03/2022   Procedure: IR WITH ANESTHESIA;  Surgeon: Radiologist, Medication, MD;  Location: [place];  Service: Radiology;  Laterality: N/A;   SHOULDER ARTHROSCOPY Right 2007   TENDON REPAIR Right 1949   foot- lacerated tendon in arch of foot   THORACENTESIS N/A 10/30/2022   Procedure: THORACENTESIS;  Surgeon: Julian Hy, DO;  Location: [facility];  Service: Cardiopulmonary;  Laterality: N/A;   TOTAL HIP ARTHROPLASTY Left 10/05/2021   Procedure: TOTAL HIP ARTHROPLASTY ANTERIOR APPROACH;  Surgeon: Rod Can, MD;  Location: [facility];  Service: Orthopedics;  Laterality: Left;     Home Medications:  Prior to Admission medications   Medication Sig Start Date End Date  Taking? Authorizing Provider  acetaminophen (TYLENOL) 325 MG tablet Take 1-2 tablets (325-650 mg total) by mouth every 6 (six) hours as needed for mild pain (pain score 1-3 or temp > 100.5). Patient taking differently: Take 500 mg by mouth every 6 (six) hours as needed for mild pain (pain score 1-3 or temp > 100.5). 10/06/21   Dorothyann Peng, PA-C  acetaminophen (TYLENOL) 325 MG tablet Take 1-2 tablets (325-650 mg total) by mouth every 4 (four) hours as needed for mild pain. 11/13/22   Love, Ivan Anchors, PA-C  apixaban (ELIQUIS) 5 MG TABS tablet Take 1 tablet (5 mg total) by mouth 2 (two) times daily. 11/08/22 12/08/22  Arrien, Jimmy Picket, MD  diltiazem (CARDIZEM CD) 360 MG 24 hr capsule Take 1 capsule (360 mg total) by mouth daily. 11/09/22 12/09/22  Arrien, Jimmy Picket, MD  famotidine (PEPCID) 20 MG tablet Take 20 mg by mouth daily as needed for indigestion or heartburn.    [provider]  levothyroxine (SYNTHROID) 88 MCG tablet Take 88 mcg by mouth daily before breakfast.    [provider]  Mouthwashes (MOUTH RINSE) LIQD solution 15 mLs by Mouth Rinse route 4 (four) times daily - after meals and at bedtime. 11/13/22   Love, Ivan Anchors, PA-C  polyethylene glycol (MIRALAX / GLYCOLAX) 17 g packet Take 17 g by mouth daily as needed for severe constipation, moderate constipation or mild constipation. 11/08/22   Arrien, Jimmy Picket, MD  rosuvastatin (CRESTOR) 10 MG tablet Take 1 tablet (10 mg total) by mouth daily. 11/09/22 12/09/22  Arrien, Jimmy Picket, MD  senna (SENOKOT) 8.6 MG TABS tablet Take 1 tablet (8.6 mg total) by mouth 2 (two) times daily. Patient taking differently: Take 1 tablet by mouth 2 (two) times daily as needed for mild constipation or moderate constipation. 10/06/21   Dorothyann Peng, PA-C  spironolactone (ALDACTONE) 25 MG tablet Take 1 tablet (25 mg total) by mouth daily. 11/09/22 12/09/22  Arrien, Jimmy Picket, MD    Inpatient  Medications: Scheduled Meds:  Continuous Infusions:  PRN Meds:   Allergies:    Allergies  Allergen Reactions   Duloxetine Nausea Only   Voltaren [Diclofenac Sodium] Shortness Of Breath and Palpitations   Lisinopril Other (See Comments)   Aspirin Other (See Comments)    ulcers   Diphenhydramine Other (See Comments)   Gabapentin Itching   Metoprolol     Cold sweat/exhaustion    Ultram [Tramadol Hcl] Other (See Comments)    Effects cognitive abilities    Proton Pump Inhibitors Rash    Itching, rash    Social History:   Social History   Socioeconomic History   Marital status: Widowed    Spouse name: Not on file   Number of children: 1   Years of education: HS+   Highest education level: Not on file  Occupational History   Occupation: Retired   Tobacco Use   Smoking status: Former    Types: Cigarettes    Quit date: 06/04/1986    Years since quitting: 36.4   Smokeless tobacco: Never   Tobacco comments:    Only smoked a few years  Vaping Use   Vaping Use: Never used  Substance and Sexual Activity   Alcohol use: No    Alcohol/week: 0.0 standard drinks of alcohol   Drug use: No   Sexual activity: Not on file  Other Topics Concern   Not on file  Social History Narrative   Drinks about 3 cups of coffee a day    Social Determinants of Health   Financial Resource Strain: Not on file  Food Insecurity: No Food Insecurity (11/04/2022)   Hunger Vital Sign    Worried About Running Out of Food in the Last Year: Never true    Ran Out of Food in the Last Year: Never true  Transportation Needs: No Transportation Needs (11/04/2022)   PRAPARE - Hydrologist (Medical): No    Lack of Transportation (Non-Medical): No  Physical Activity: Not on file  Stress: Not on file  Social Connections: Not on file  Intimate Partner Violence: Not At Risk (11/04/2022)   Humiliation, Afraid, Rape, and Kick questionnaire    Fear of Current or Ex-Partner: No     Emotionally Abused: No    Physically Abused: No    Sexually Abused: No    Family History:   Family History  Problem Relation Age of Onset   Hypertension Mother    Lung cancer Mother    Lung cancer Father    Diabetes Sister    Hypertension Sister  Congestive Heart Failure Sister    Arthritis Brother      ROS:  Please see the history of present illness.  Review of Systems  Constitutional:  Negative for fever.  HENT:  Negative for congestion.   Respiratory:  Negative for shortness of breath.   Cardiovascular:  Negative for chest pain, palpitations, orthopnea, leg swelling and PND.  Gastrointestinal:  Positive for nausea. Negative for blood in stool, melena and vomiting.  Genitourinary:  Negative for hematuria.  Musculoskeletal:  Negative for myalgias.  Neurological:  Negative for dizziness. Weakness: residual left side weakness from recent stroke. Endo/Heme/Allergies:  Does not bruise/bleed easily.  Psychiatric/Behavioral:  Negative for substance abuse.      Physical Exam/Data:  There were no vitals filed for this visit.  Intake/Output Summary (Last 24 hours) at 11/23/2022 1757 Last data filed at 11/23/2022 1300 Gross per 24 hour  Intake 340 ml  Output --  Net 340 ml      11/23/2022    5:00 AM 11/20/2022    5:00 AM 11/18/2022    5:00 AM  Last 3 Weights  Weight (lbs) 144 lb 10 oz 145 lb 15.1 oz 145 lb 8.1 oz  Weight (kg) 65.6 kg 66.2 kg 66 kg     There is no height or weight on file to calculate BMI.  General: 81 y.o. Caucasian female resting comfortably in no acute distress. HEENT: Normocephalic and atraumatic. Sclera clear.  Neck: Supple. No JVD. Heart: Tachycardic with irregularly irregular rhythm. No significant murmurs, gallops, or rubs. Radial pulses 2+ and equal bilaterally. Lungs: No increased work of breathing. Decreased breath sounds in bilateral bases (left > right) with faint crackles.  Abdomen: Soft, non-distended, and non-tender to palpation.   Extremities: No lower extremity edema.    Skin: Warm and dry. Neuro: Mild dysarthria and some residual left sided weakness from recent stroke. Psych: Normal affect. Responds appropriately.   EKG:  The EKG was personally reviewed and demonstrates:  Atrial fibrillation, rate 159 bpm, with no acute ST/T changes. Telemetry:  Telemetry was personally reviewed and demonstrates:  Atrial fibrillation with rates mostly in the 140s to 160s.  Relevant CV Studies:  Echocardiogram 10/30/2022: Impressions:  1. Left ventricular ejection fraction, by estimation, is 40 to 45%. The  left ventricle has mildly decreased function. The left ventricle  demonstrates global hypokinesis. There is moderate asymmetric left  ventricular hypertrophy of the basal-septal  segment. Left ventricular diastolic parameters are indeterminate.   2. Right ventricular systolic function is normal. The right ventricular  size is normal. There is normal pulmonary artery systolic pressure.   3. Left atrial size was mildly dilated.   4. The mitral valve is abnormal. Moderate to severe mitral valve  regurgitation. No evidence of mitral stenosis.   5. The aortic valve was not well visualized. Aortic valve regurgitation  is trivial. No aortic stenosis is present.   6. The inferior vena cava is dilated in size with >50% respiratory  variability, suggesting right atrial pressure of 8 mmHg.   Comparison(s): MR is worse from prior report.   Laboratory Data:  High Sensitivity Troponin:   Recent Labs  Lab 10/29/22 1552 10/29/22 2018 10/31/22 1540 10/31/22 1736  TROPONINIHS '13 11 12 14     '$ Chemistry Recent Labs  Lab 11/19/22 0613 11/20/22 0527 11/23/22 0842  NA 139 139 138  K 4.5 3.7 3.8  CL 102 103 103  CO2 '26 27 27  '$ GLUCOSE 112* 116* 134*  BUN '18 18 19  '$ CREATININE  1.42* 1.21* 1.36*  CALCIUM 9.9 9.8 9.8  GFRNONAA 37* 45* 39*  ANIONGAP '11 9 8    '$ No results for input(s): "PROT", "ALBUMIN", "AST", "ALT",  "ALKPHOS", "BILITOT" in the last 168 hours. Lipids No results for input(s): "CHOL", "TRIG", "HDL", "LABVLDL", "LDLCALC", "CHOLHDL" in the last 168 hours.  Hematology Recent Labs  Lab 11/19/22 0613  WBC 10.4  RBC 4.77  HGB 13.3  HCT 41.3  MCV 86.6  MCH 27.9  MCHC 32.2  RDW 15.2  PLT 262   Thyroid No results for input(s): "TSH", "FREET4" in the last 168 hours.  BNPNo results for input(s): "BNP", "PROBNP" in the last 168 hours.  DDimer No results for input(s): "DDIMER" in the last 168 hours.   Radiology/Studies:  DG Chest 2 View  Result Date: 11/20/2022 CLINICAL DATA:  Increased breath sounds in the bases EXAM: CHEST - 2 VIEW COMPARISON:  10/31/2022 FINDINGS: Cardiac shadow is within normal limits. Previously seen vascular congestion and edema has resolved. Some patchy residual density noted over the right base which may represent some scarring. Short-term follow-up to assess for stability is recommended. No focal infiltrate or effusion is noted. No bony abnormality is seen. IMPRESSION: Persistent density in the right lung base likely representing some scarring from previous infiltrate. Continued follow-up is recommended. Electronically Signed   By: Inez Catalina [name]   On: 11/20/2022 19:53     Assessment and Plan:   Persistent Atrial Fibrillation Recently admitted last month for new onset atrial fibrillation. Echo showed LVEF of 40-45%. She was started on Diltiazem with improvement in rates. She was discharged to CIR due to stroke during last hospitalization. We were reconsulted today for recurrent rapid atrial fibrillation with rates in the 150s with associated hypotension. - Stop Diltiazem given hypotension and reduced EF. - Start IV Amiodarone with bolus. - Continue Eliquis '5mg'$  twice daily. - If unable to rate control with Amiodarone, may need to consider TEE/DCCV. Otherwise, would plan outpatient DCCV if she does not converted with Amiodarone.  Chronic Systolic CHF Echo during  recent admission showed LVEF of 40-45% with global hypokinesis , moderate asymmetric LVH of the basal-septal segment and moderate to severe MR.  - Decreased breath sounds in bases but otherwise appears euvolemic. - No need for diuresis at this time especially in light of hypotension. - She is not currently on any GDMT due to orthostatic hypotension during CIR and current hypotension in setting of rapid atrial fibrillation. She was on Diltiazem. If we need any rate control agents in the future, would ideally use beta-blocker given reduced EF. - Cardiomyopathy possibly due to tachyarrhythmias. Would repeat Echo in 2-3 months after restoration of sinus rhythm. If EF has not improved at that time, could consider ischemic evaluation.  Hypotension Currently hypotensive in setting of rapid atrial fibrillation with systolic BP in the 37J. However, she has also been struggling with orthostatic hypotension while in CIR. - Continue receiving IV fluid bolus.  - Stop Diltiazem.  - Continue TED hose and abdominal binder if needed to help with orthostasis.  Moderate to Severe MR Noted on Echo during recent admission. - Will need to continue to monitor this as an outpatient.  Otherwise, per primary team: - Recent MCA stroke - Recent pneumonia - Hypothyroidism - GERD   Risk Assessment/Risk Scores:  {  New York Heart Association (NYHA) Functional Class NYHA Class II  CHA2DS2-VASc Score = 8  This indicates a 10.8% annual risk of stroke. The patient's score is based upon: CHF  History: 1 HTN History: 1 Diabetes History: 0 Stroke History: 2 Vascular Disease History: 1 (coronary artery calcifications noted on CT) Age Score: 2 Gender Score: 1  For questions or updates, please contact [place] Please consult [email or web address] for contact info under    Signed, Darreld Mclean, PA-C  11/23/2022 5:57 PM  History and all data above reviewed.  Patient examined.  I agree with the findings  as above.  She had been progressing with her CVA and was taking a few steps with PT.  She was due to be released on Monday possibly.  She said that she was feeling OK when she was noted to have an increased heart rate with hypotension.  She says that she as otherwise felt okay.  She has not had any fevers or chills.  She had no urinary symptoms.  She has had no diarrhea.  She might get a little lightheaded when she stands up.  She has had a mildly rising creatinine over the last few days.  She has been hydrating although probably not enough.  She has not been eating very much.  I interviewed her with her daughter in the room.  The patient exam reveals COR: Irregular systolic murmur at the apex,  Lungs: Clear to auscultation bilaterally,  Abd: Positive bowel sounds, no bruits, rebound, or guarding, Ext 2+ pulses, legs wrapped.  All available labs, radiology testing, previous records reviewed. Agree with documented assessment and plan.  Atrial fibrillation with rapid rate: I suspect this could be related to some dehydration.  She may be transferred from rehab to 6 E.  She is getting gentle hydration although we have to be careful with her MRI and her cardiomyopathy.  We will start amiodarone IV.  Continue Eliquis.  Holding Cardizem.    Cardiomyopathy: Patient seems to be slightly hypovolemic and is getting 250 cc bolus of normal saline.  If she remains hypotensive she likely would tolerate slightly more fluid but again to be judicious.    Jeneen Rinks Timera Windt  6:11 PM  11/23/2022

## 2022-11-23 NOTE — Significant Event (Signed)
Rapid Response Event Note   Reason for Call :  Rapid HR 150-160s  Initial Focused Assessment:  Patient is lying in trendelenburg. She is alert and talkative.  She is answering questions and following commands.  She states that she feel tired but denies dizziness, shortness of breath or chest pain. Lung sounds clear, Heart tones irregular.  BP 110/65 (manual)  HR 150-160  RR 17  O2 sat 98% RA  Temp 98.5  Dr Tamala Julian ([facility]) at bedside to assess patient Cardiology PA at bedside to assess patient Interventions:    Plan of Care:  Readmit to telemetry and start Amiodarone gtt   Event Summary:   MD Notified: Algis Liming at bedside Call Time: [place] Time: 1611 End Time: 1720  Raliegh Ip, RN

## 2022-11-23 NOTE — Progress Notes (Addendum)
Patient ID: Leslie [name], female   DOB: 07-14-1941, 81 y.o.   MRN: [id number]  HB ordered through [facility] referral sent to [facility] for FU.  Patient approved for FU PT/OT/SLP Orders sent  Contact information for [employer] provided to daughter to process DME.

## 2022-11-23 NOTE — Progress Notes (Signed)
Speech Language Pathology Daily Session Note  Patient Details  Name: Leslie [name] MRN: [id number] Date of Birth: 1941-05-09  Today's Date: 11/23/2022 SLP Individual Time: 0900-1000 SLP Individual Time Calculation (min): 60 min  Short Term Goals: Week 2: SLP Short Term Goal 1 (Week 2): STG=LTG due to ELOS  Skilled Therapeutic Interventions: Skilled treatment session focused on education with the patient and pt's daughter and son-in-law. SLP facilitated session by providing education regarding patient's current deficits and their impact on attention, memory, problem solving, awareness, and overall safety. SLP reinforced the importance of 24 hour supervision to ensure safe decision making as patient has decreased awareness of deficits. SLP provided strategies to maximize attention, recall, problem solving, and overall safety with functional tasks. Daughter reported pt was very independent at [facility] and that current status is likely well below pt's cognitive baseline. SLP educated on pt's current diet recommendations of dysphagia 3 textures with thin liquids and crushed medications. Discussed strategies to minimize oral residuals including liquid rinse, finger sweep, lingual sweep, and use of oral sponges. Pt has been known to perseverate on trace amounts of oral residuals during meals which appears to limit pt's attention to task and reduce amount of PO intake consumed. Pt verbalized anxiousness regarding "food particles in my mouth" (min oral residuals) when she eats and SLP discussed strategies to build confidence and reduce anxiety surrounding eating (e.g., consume easier to masticate foods, soups, mashed potatoes, foods with limited particulates, etc.) Pt requested a suction kit for home. SLP communicated request to SW. All verbalized understanding of education and SLP provided handouts to reinforce information. Patient left upright in recliner with all needs within reach. Continue with current  plan of care.       Pain Pain Assessment Pain Scale: 0-10 Pain Score: 0-No pain Faces Pain Scale: No hurt Pain Type: Acute pain  Therapy/Group: Individual Therapy  Ann Groeneveld T Zaidin Blyden 11/23/2022, 10:02 AM

## 2022-11-23 NOTE — Progress Notes (Signed)
Inpatient Rehabilitation Discharge Medication Review by a Pharmacist  A complete drug regimen review was completed for this patient to identify any potential clinically significant medication issues.  High Risk Drug Classes Is patient taking? Indication by Medication  Antipsychotic No   Anticoagulant Yes Apixaban - atrial fibrillation and stroke prevention  Antibiotic No   Opioid No   Antiplatelet No   Hypoglycemics/insulin No   Vasoactive Medication Yes Diltiazem CD - hold for AF  Chemotherapy No   Other Yes Cyanocobalamin - supplement Famotidine - GI prophylaxis Levothyroxine - thyroid supplement Rosuvastatin - hyperlipidemia Senokot S - laxative Spironolactone - diuretic  Amiodarone - AF      Type of Medication Issue Identified Description of Issue Recommendation(s)  Drug Interaction(s) (clinically significant)     Duplicate Therapy     Allergy     No Medication Administration End Date     Incorrect Dose     Additional Drug Therapy Needed     Significant med changes from prior encounter (inform family/care partners about these prior to discharge).    Other       Clinically significant medication issues were identified that warrant physician communication and completion of prescribed/recommended actions by midnight of the next day:  No  Name of provider notified for urgent issues identified: n/a  Provider Method of Notification: n/a   Pharmacist comments:   Time spent performing this drug regimen review (minutes):  [place], PharmD, [place], AAHIVP, CPP Infectious Disease Pharmacist 11/23/2022 8:12 PM

## 2022-11-23 NOTE — Progress Notes (Signed)
Recreational Therapy Discharge Summary Patient Details  Name: Leslie [name] MRN: [id number] Date of Birth: Dec 30, 1940 Today's Date: 11/23/2022 Comments on progress toward goals: Pt has made good progress during LOS but continues to be limited by orthostasis & low activity tolerance.  TR sessions focused on activity analysis/modifications & activity tolerance.  Pt completes simple tasks seated with supervision/set up assistance but required frequent rest breaks.  Pt is scheduled for discharge home 12/18 with family to provide/coordinate the needed supervision/assistance.  Reasons for discharge: discharge from hospital  Follow-up: [place] agrees with progress made and goals achieved: Yes  Leslie [name] 11/23/2022, 12:30 PM

## 2022-11-23 NOTE — Progress Notes (Signed)
PROGRESS NOTE   Subjective/Complaints:   Intermittent confusion noted per CNAs No more itching since pt on hypoallergenic sheets   Review of Systems  Constitutional:  Negative for chills and fever.  HENT:  Negative for congestion.   Eyes:  Negative for blurred vision.  Respiratory:  Negative for shortness of breath.   Cardiovascular:  Negative for chest pain.  Gastrointestinal:  Positive for constipation and nausea.  Genitourinary: Negative.   Neurological:  Positive for weakness. Negative for headaches.      Objective:   No results found. No results for input(s): "WBC", "HGB", "HCT", "PLT" in the last 72 hours.  No results for input(s): "NA", "K", "CL", "CO2", "GLUCOSE", "BUN", "CREATININE", "CALCIUM" in the last 72 hours.   Intake/Output Summary (Last 24 hours) at 11/23/2022 0819 Last data filed at 11/22/2022 1840 Gross per 24 hour  Intake 120 ml  Output --  Net 120 ml         Physical Exam: Vital Signs Blood pressure 108/60, pulse 82, temperature 98.4 F (36.9 C), resp. rate 16, height 5' 7.5" (1.715 m), weight 65.6 kg, SpO2 98 %.   General: No acute distress Mood and affect are appropriate Heart: Regular rate and rhythm no rubs murmurs or extra sounds Lungs: Clear to auscultation, breathing unlabored, no rales or wheezes Abdomen: Positive bowel sounds, soft nontender to palpation, nondistended Extremities: No clubbing, cyanosis, or edema Skin: No evidence of breakdown, no evidence of rash  Neuro:  Left central 7. Mild dysarthria. Fair insight and awareness. Needs redirection at times.  Moves left arm and leg well  4/5. Sensory exam normal for light touch and pain in all 4 limbs. No limb ataxia or cerebellar signs. No abnormal tone appreciated.   Sensation sl decreased to LT in LUE vs RUE  Musculoskeletal: Full ROM, No pain with AROM or PROM in the neck, trunk, or extremities. Posture appropriate      Assessment/Plan: 1. Functional deficits which require 3+ hours per day of interdisciplinary therapy in a comprehensive inpatient rehab setting. Physiatrist is providing close team supervision and 24 hour management of active medical problems listed below. Physiatrist and rehab team continue to assess barriers to discharge/monitor patient progress toward functional and medical goals  Care Tool:  Bathing    Body parts bathed by patient: Right arm, Left arm, Chest, Abdomen, Front perineal area, Right upper leg, Left upper leg, Face   Body parts bathed by helper: Right lower leg, Buttocks, Left lower leg     Bathing assist Assist Level: Moderate Assistance - Patient 50 - 74%     Upper Body Dressing/Undressing Upper body dressing   What is the patient wearing?: Pull over shirt    Upper body assist Assist Level: Minimal Assistance - Patient > 75%    Lower Body Dressing/Undressing Lower body dressing      What is the patient wearing?: Pants     Lower body assist Assist for lower body dressing: Maximal Assistance - Patient 25 - 49%     Toileting Toileting Toileting Activity did not occur (Clothing management and hygiene only): Refused  Toileting assist Assist for toileting: Dependent - Patient 0%     Transfers  Chair/bed transfer  Transfers assist     Chair/bed transfer assist level: Minimal Assistance - Patient > 75% Chair/bed transfer assistive device: Armrests, Programmer, multimedia   Ambulation assist   Ambulation activity did not occur: Safety/medical concerns (unable d/t increased dizziness and nausea from BPPV)  Assist level: Minimal Assistance - Patient > 75% Assistive device: Walker-rolling Max distance: 15f   Walk 10 feet activity   Assist  Walk 10 feet activity did not occur: Safety/medical concerns  Assist level: Contact Guard/Touching assist Assistive device: Walker-rolling   Walk 50 feet activity   Assist Walk 50 feet with 2  turns activity did not occur: Safety/medical concerns (nausea)  Assist level: Contact Guard/Touching assist Assistive device: Walker-rolling    Walk 150 feet activity   Assist Walk 150 feet activity did not occur: Safety/medical concerns (nausea)         Walk 10 feet on uneven surface  activity   Assist Walk 10 feet on uneven surfaces activity did not occur: Safety/medical concerns (nausea)         Wheelchair     Assist Is the patient using a wheelchair?: Yes Type of Wheelchair: Manual Wheelchair activity did not occur: Safety/medical concerns (unable d/t increased dizziness and nausea from BPPV)         Wheelchair 50 feet with 2 turns activity    Assist    Wheelchair 50 feet with 2 turns activity did not occur: Safety/medical concerns       Wheelchair 150 feet activity     Assist  Wheelchair 150 feet activity did not occur: Safety/medical concerns       Blood pressure 108/60, pulse 82, temperature 98.4 F (36.9 C), resp. rate 16, height 5' 7.5" (1.715 m), weight 65.6 kg, SpO2 98 %.  Medical Problem List and Plan: 1. 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  Antithrombotics: -DVT/anticoagulation:  Pharmaceutical: Eliquis             -antiplatelet therapy: N/A 3. Pain Management: N/A 4. Mood/Behavior/Sleep: LCSW to follow for evaluation and support.              -antipsychotic agents: N/A 5. Neuropsych/cognition: This patient maybe capable of making decisions on her own behalf. 6. Skin/Wound Care: Routine pressure relief measures.  Improved, likely due to contact dermatitis from bleached sheets    7.  Fluids/Electrolytes/Nutrition: Monitor I/O.      Latest Ref Rng & Units 11/09/2022    5:55 AM 11/08/2022    1:46 AM 11/07/2022    1:33 AM  BMP  Glucose 70 - 99 mg/dL 118  105  118   BUN 8 - 23 mg/dL '20  17  17   '$ Creatinine 0.44 - 1.00 mg/dL 1.22  1.13  1.12   Sodium 135 - 145 mmol/L 139  139  135   Potassium 3.5 - 5.1 mmol/L 3.5  3.7  3.8   Chloride 98 - 111 mmol/L 99  103  101   CO2 22 - 32 mmol/L '28  26  25   '$ Calcium  8.9 - 10.3 mg/dL 9.8  9.2  8.8   Poor intake repeat BMET as BP on low side   8. MVR/A fib: Monitor HR TID--on Eliquis and Cardizem but heart rate seems to be uncontrolled.             --monitor for symptoms with increase in activity.              --plans for CV in the future.             --heart healthy diet. 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       Filed Weights   11/18/22 0500 11/20/22 0500 11/23/22 0500  Weight: 66 kg 66.2 kg 65.6 kg  Weights balanced Crackles in lungs d/t atelectasis  CXR negative for pulm edema    9. HTN: Monitor BP TID--on Cardizem and Lasix-->recommendations to d/c Lasix and start aldactone in am.              --lasix to be used prn overload.  12/11 bp controlled on cardizem '75mg'$  qid Still with orthostasis will reduce cardizem dose 12/15 to '30mg'$  q8 h Vitals:   11/23/22 0542 11/23/22 0544  BP: (!) 86/65 108/60  Pulse: (!) 42 82  Resp: 16   Temp: 98.4 F (36.9 C)   SpO2: 98%     10. Leucocytosis:  Monitor for fevers and other signs of infection.             12/2 no signs at present     Latest Ref Rng & Units 11/09/2022    5:55 AM 11/08/2022    1:46 AM 11/07/2022    1:33 AM  CBC  WBC 4.0 - 10.5 K/uL 14.7  14.2  11.8   Hemoglobin 12.0 - 15.0 g/dL 13.5  13.1  11.7   Hematocrit 36.0 - 46.0 % 42.1  40.8  37.2   Platelets 150 - 400 K/uL 367  312  287     11. Acute renal failure:  SCr 0.98-->1.25-->1.13 improved       Latest Ref Rng & Units 11/20/2022    5:27 AM 11/19/2022    6:13 AM 11/14/2022    5:59 AM  BMP  Glucose  70 - 99 mg/dL 116  112  107   BUN 8 - 23 mg/dL '18  18  31   '$ Creatinine 0.44 - 1.00 mg/dL 1.21  1.42  1.26   Sodium 135 - 145 mmol/L 139  139  137   Potassium 3.5 - 5.1 mmol/L 3.7  4.5  3.6   Chloride 98 - 111 mmol/L 103  102  101   CO2 22 - 32 mmol/L '27  26  26   '$ Calcium 8.9 - 10.3 mg/dL 9.8  9.9  9.7    Hold off on restarting aldactone, will see BP response to cardizem reduction , enc fluid, now on thin liq  12/11 see #7 above 12. CAP: Has completed antibiotic regimen 11/05/22.             --monitor for hypoxia/dyspnea-->denies respiratory symptoms.  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 No symptoms?  14. Constipation   BM qd    LOS: 15 days A FACE TO FACE EVALUATION WAS PERFORMED  Charlett Blake 11/23/2022, 8:19 AM

## 2022-11-23 NOTE — Progress Notes (Signed)
Upon walking in room, noted patient sitting up in wheelchair (placed there by therapy). Patient looked fatigued and states she felt "lightheaded". Patient alert and oriented at baseline and answering questions appropriately.  Blood pressure checked and reading was noted to be 84/50. HR was ranging from 140's and down to 40's. Radial pulse weak and thready. Resident has chronic afib. Assessed via stethoscope and noted fast irregular rate. Called Pam PA, EKG ordered along with manual BP.  Manual BP 88/54. EKG showed ranges of 220-140 bpm, then averaged at 140-160 bpm. Patient continued to be weak. Rapid response initiated.  BP manual came up to 100/70. PA and charge nurse took over care with rapid nurse.

## 2022-11-23 NOTE — Progress Notes (Shared)
Speech Language Pathology Discharge Summary  Patient Details  Name: Leslie [name] MRN: [id number] Date of Birth: 02-Jul-1941  Date of Discharge from [facility] 16, 2023  {chl ip rehab slp time calculations:[id number]}  Skilled Therapeutic Interventions:  ***   Patient has met 8 of 9 long term goals.  Patient to discharge at overall Mod level.  Reasons goals not met: Pt did not meet emergent awareness goal. Pt with ongoing intellectual awareness impairment   Clinical Impression/Discharge Summary: Patient made slow but functional gains and has met 8 of 9 long-term goals this admission due to improved oropharyngeal swallow function, speech, and cognitive-linguistic skills in the areas of sustained attention, intellectual awareness, and basic problem solving. Patient is currently completing functional and simple cognitive tasks with mod A cues. Pt is currently implementing speech intelligibility strategies at the mod I level to effectively communicate her functional needs. Pt is currently consuming a dysphagia 3 diet with thin liquids and crushed medications with min A to implement swallowing precautions and compensations. Patient and family education is complete and patient to discharge at overall mod assist level. Patient's care partner is independent to provide the necessary physical and cognitive assistance at discharge. Patient would benefit from continued SLP services in home health setting to maximize swallowing and cognitive-linguistic function and functional independence. Pt is highly recommended to have 24 hour supervision for pt's safety and wellbeing due to current cognitive needs. Pt is highly recommended to have support/close supervision with all ADLs and iADLs.   Care Partner:  Caregiver Able to Provide Assistance: Yes  Type of Caregiver Assistance: Cognitive;Physical  Recommendation:  24 hour supervision/assistance;Home Health SLP  Rationale for SLP Follow Up: Maximize  cognitive function and independence;Maximize swallowing safety;Reduce caregiver burden   Equipment: Suction kit per pt request   Reasons for discharge: Discharged from hospital   Patient/Family Agrees with Progress Made and Goals Achieved: Yes   Patty Sermons 11/23/2022, 3:49 PM

## 2022-11-24 DIAGNOSIS — I4891 Unspecified atrial fibrillation: Secondary | ICD-10-CM | POA: Diagnosis not present

## 2022-11-24 DIAGNOSIS — I5020 Unspecified systolic (congestive) heart failure: Secondary | ICD-10-CM | POA: Diagnosis not present

## 2022-11-24 MED ORDER — METOPROLOL TARTRATE 12.5 MG HALF TABLET
12.5000 mg | ORAL_TABLET | Freq: Two times a day (BID) | ORAL | Status: DC
  Administered 2022-11-24 – 2022-11-25 (×2): 12.5 mg via ORAL
  Filled 2022-11-24 (×3): qty 1

## 2022-11-24 MED ORDER — SODIUM CHLORIDE 0.9 % IV BOLUS
250.0000 mL | Freq: Once | INTRAVENOUS | Status: AC
  Administered 2022-11-24: 250 mL via INTRAVENOUS

## 2022-11-24 NOTE — Progress Notes (Signed)
   11/24/22 0003  Assess: MEWS Score  Temp (!) 97.4 F (36.3 C)  BP 99/71  MAP (mmHg) 80  Pulse Rate (!) 102  ECG Heart Rate (!) 126  Resp 18  SpO2 99 %  O2 Device Room Air  Assess: MEWS Score  MEWS Temp 0  MEWS Systolic 1  MEWS Pulse 2  MEWS RR 0  MEWS LOC 0  MEWS Score 3  MEWS Score Color Yellow  Assess: if the MEWS score is Yellow or Red  Were vital signs taken at a resting state? Yes  Focused Assessment No change from prior assessment  Does the patient meet 2 or more of the SIRS criteria? No  Does the patient have a confirmed or suspected source of infection? No  Provider and Rapid Response Notified? Yes  Treat  Pain Scale 0-10  Pain Score 0  Take Vital Signs  Increase Vital Sign Frequency  Yellow: Q 2hr X 2 then Q 4hr X 2, if remains yellow, continue Q 4hrs  Escalate  MEWS: Escalate Yellow: discuss with charge nurse/RN and consider discussing with provider and RRT  Notify: Charge Nurse/RN  Name of Charge Nurse/RN Notified Microbiologist  Date Charge Nurse/RN Notified 11/23/22  Time Charge Nurse/RN Notified 2000  Provider Notification  Provider Name/Title [facility] MD  Date Provider Notified 11/23/22  Time Provider Notified 2000  Method of Notification Page  Notification Reason Other (Comment) (LOW bp)  Provider response See new orders  Document  Patient Outcome Not stable and remains on department  Assess: SIRS CRITERIA  SIRS Temperature  0  SIRS Pulse 1  SIRS Respirations  0  SIRS WBC 0  SIRS Score Sum  1

## 2022-11-24 NOTE — Evaluation (Signed)
Clinical/Bedside Swallow Evaluation Patient Details  Name: Leslie [name] MRN: [id number] Date of Birth: Oct 04, 1941  Today's Date: 11/24/2022 Time: SLP Start Time (ACUTE ONLY): 94 SLP Stop Time (ACUTE ONLY): 1050 SLP Time Calculation (min) (ACUTE ONLY): 15 min  Past Medical History:  Past Medical History:  Diagnosis Date   Arthritis    Back pain due to injury    fell off a ladder and fractured back-T12 down (multiple surgeries w/ onging pain)   Chronic kidney disease    "stage 3 kidney failure" improving per pt   Hypothyroidism    Insomnia    Plantar fasciitis    Stomach ulcer ~2000   Stroke ([facility])    TIA X2   2001, jan 2017   Vertigo, benign paroxysmal, bilateral    Vitamin D deficiency    Past Surgical History:  Past Surgical History:  Procedure Laterality Date   ABDOMINAL HYSTERECTOMY  1974   ANKLE FUSION Right 06/07/2016   Procedure: RIGHT TALONAVICULAR ARTHRODESIS ;  Surgeon: Wylene Simmer, MD;  Location: [facility];  Service: Orthopedics;  Laterality: Right;   APPENDECTOMY     BACK SURGERY     x4  fusion T12 thru sacrum; U rod in sacrum   CATARACT EXTRACTION, BILATERAL Bilateral 2015   GASTROCNEMIUS RECESSION Right 06/07/2016   Procedure: RIGHT GASTROC RECESSION ;  Surgeon: Wylene Simmer, MD;  Location: [facility];  Service: Orthopedics;  Laterality: Right;   HAND SURGERY Left 2003   x2   IR ANGIO EXTRACRAN SEL COM CAROTID INNOMINATE UNI L MOD SED  11/03/2022   IR CT HEAD LTD  11/03/2022   IR PERCUTANEOUS ART THROMBECTOMY/INFUSION INTRACRANIAL INC DIAG ANGIO  11/03/2022   L heel surgery for torn tendon Left 2015   PLANTAR FASCIA SURGERY Right 2009   surgery at [facility] 11/03/2022   Procedure: IR WITH ANESTHESIA;  Surgeon: Radiologist, Medication, MD;  Location: [facility];  Service: Radiology;  Laterality: N/A;   SHOULDER ARTHROSCOPY Right 2007   TENDON REPAIR Right 1949   foot- lacerated tendon in arch of  foot   THORACENTESIS N/A 10/30/2022   Procedure: THORACENTESIS;  Surgeon: Julian Hy, DO;  Location: [facility];  Service: Cardiopulmonary;  Laterality: N/A;   TOTAL HIP ARTHROPLASTY Left 10/05/2021   Procedure: TOTAL HIP ARTHROPLASTY ANTERIOR APPROACH;  Surgeon: Rod Can, MD;  Location: [facility];  Service: Orthopedics;  Laterality: Left;   HPI:  [name] is an 81 y.o. Caucasian female with PMH of hypothyroidism and GERD who was originally admitted for community-acquired pneumonia and atrial fibrillation with RVR.  Patient was recently started on Eliquis, and pneumonia was improving with antibiotics.  While admitted she went to the restroom and developed sudden onset right gaze deviation, left-sided hemiparesis and left-sided neglect.  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  Patient was taken to IR for mechanical thrombectomy. MRI shows Small areas of acute infarct in the right frontal and temporal  lobe cortex and caudate body without hemorrhage or mass effect. She was d/c to IPR, where she was set to d/c home, but on discharge date had cardiac episode and was readmitted to [facility].   Assessment / Plan / Recommendation  Clinical Impression   Ms. Pandey in known to SLP team from prior hospitalization (see history above.) She has been tolerating thin liquids and dys 3 solids since 11/14/22 without evidence of aspiration or respiratory compromise. She  continues with oral dysphagia c/b reduced lingual strength/ROM, which results in solids and pills remaining in L lateral sulci. She is sensate and attempts to clear with tongue, but occasionally has to use finger. Cognition is also impaired (not formally assessed this date), but she appears to be easily distractible, which may impact PO safety. Oral mech exam reveals ongoing significant L sided weakness (facial, labial, lingual). She was independently utilizing straw placed on strong side (R) to  consume single sips of thin liquid without s/s aspiration. She consumed saltine crackers with trace-mild oral residue noted after the swallow, which she resolves independently with liquid wash and lingual sweeps.  Recommend: thin liquids via straw, dys 3 solids, meds crushed in puree, alternate solids/liquids, check L for pocketing, intermittent supervision for cues, SLP to follow for diet upgrade as pt is ready as well as SLE. SLP Visit Diagnosis: Dysphagia, oral phase (R13.11)    Aspiration Risk  Mild aspiration risk    Diet Recommendation Dysphagia 3 (Mech soft);Thin liquid   Medication Administration: Crushed with puree Supervision: Patient able to self feed;Intermittent supervision to cue for compensatory strategies Compensations: Slow rate;Small sips/bites;Follow solids with liquid;Lingual sweep for clearance of pocketing Postural Changes: Seated upright at 90 degrees    Other  Recommendations Oral Care Recommendations: Oral care before and after PO    Recommendations for follow up therapy are one component of a multi-disciplinary discharge planning process, led by the attending physician.  Recommendations may be updated based on patient status, additional functional criteria and insurance authorization.  Follow up Recommendations Acute inpatient rehab (3hours/day)      Assistance Recommended at Discharge  TBD  Functional Status Assessment Patient has had a recent decline in their functional status and demonstrates the ability to make significant improvements in function in a reasonable and predictable amount of time.  Frequency and Duration min 2x/week  1 week       Prognosis Prognosis for Safe Diet Advancement: Good      Swallow Study   General Date of Onset: 11/09/22 HPI: Leslie [name] is an 81 y.o. Caucasian female with PMH of hypothyroidism and GERD who was originally admitted for community-acquired pneumonia and atrial fibrillation with RVR.  Patient was recently  started on Eliquis, and pneumonia was improving with antibiotics.  While admitted she went to the restroom and developed sudden onset right gaze deviation, left-sided hemiparesis and left-sided neglect.  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  Patient was taken to IR for mechanical thrombectomy. MRI shows Small areas of acute infarct in the right frontal and temporal  lobe cortex and caudate body without hemorrhage or mass effect. Previous Swallow Assessment: CSE in IPR Diet Prior to this Study: Dysphagia 3 (soft);Thin liquids Temperature Spikes Noted: No Respiratory Status: Room air History of Recent Intubation: No Behavior/Cognition: Alert;Cooperative;Pleasant mood;Distractible Oral Cavity Assessment: Within Functional Limits Oral Cavity - Dentition: Adequate natural dentition Vision: Functional for self-feeding Self-Feeding Abilities: Able to feed self Patient Positioning: Upright in bed Baseline Vocal Quality: Normal Volitional Cough: Strong Volitional Swallow: Able to elicit    Oral/Motor/Sensory Function Overall Oral Motor/Sensory Function: Moderate impairment Facial ROM: Reduced left;Suspected CN VII (facial) dysfunction Facial Symmetry: Abnormal symmetry left;Suspected CN VII (facial) dysfunction Facial Strength: Reduced left;Suspected CN VII (facial) dysfunction Facial Sensation: Reduced left;Suspected CN V (Trigeminal) dysfunction Lingual ROM: Reduced left;Suspected CN XII (hypoglossal) dysfunction Lingual Symmetry: Abnormal symmetry left;Suspected CN XII (hypoglossal) dysfunction Lingual Strength: Suspected CN XII (hypoglossal) dysfunction Lingual  Sensation: Within Functional Limits Velum: Within Functional Limits Mandible: Within Functional Limits   Thin Liquid Thin Liquid: Within functional limits Presentation: Straw    Puree Puree: Within functional limits Presentation: Self Fed;Spoon   Solid     Solid: Within functional  limits Presentation: Self Fed Oral Phase Functional Implications: Oral residue;Left lateral sulci pocketing     Armany Mano P. Alianys Chacko, M.S., CCC-SLP Speech-Language Pathologist Acute Rehabilitation Services Pager: [place] 11/24/2022,11:07 AM

## 2022-11-24 NOTE — Plan of Care (Signed)
Discharged to acute due to medical changes, unable to complete rehab program.

## 2022-11-24 NOTE — Progress Notes (Signed)
Physical Therapy Discharge Note  Patient Details  Name: Leslie [name] MRN: [id number] Date of Birth: Mar 08, 1941 Today's Date: 11/24/2022    This patient was unable to complete the inpatient rehab program due to medical complications; therefore did not meet their long term goals. Pt left the program at a CGA/min assist level for their functional mobility/ transfers using RW while wearing abdominal binder, thigh high TED hose, and ACE wraps due to orthostatic hypotension. This patient is being discharged from PT services at this time.  Pt's perception of pain in the last five days was unable to answer at this time.    See CareTool for functional status details  If the patient is able to return to inpatient rehabilitation within 3 midnights, this may be considered an interrupted stay and therapy services will resume as ordered. Modification and reinstatement of their goals will be made upon completion of therapy service reevaluations.    Tawana Scale , PT, DPT, NCS, CSRS 11/24/2022, 3:17 PM

## 2022-11-24 NOTE — Progress Notes (Signed)
PT Cancellation Note  Patient Details Name: Leslie [name] MRN: [id number] DOB: 18-Jul-1941   Cancelled Treatment:    Reason Eval/Treat Not Completed: Medical issues which prohibited therapy (rapid Afib, up to 147 at rest)  Wyona Almas, PT, DPT [facility] 11/24/2022, 2:58 PM

## 2022-11-24 NOTE — Progress Notes (Signed)
PROGRESS NOTE    Leslie [name]  MHD:[id number] DOB: 04/12/1941 DOA: 11/23/2022 PCP: Leslie Side., FNP  No chief complaint on file.   Brief Narrative:  Leslie [name] is Leslie [name] 81 y.o. female with medical history significant of CVA, atrial flutter fibrillation/flutter, hypothyroidism, GERD who is being transferred back to inpatient side after being found to be in atrial flutter with heart rates into the 160s and low blood pressures.   Just recently been hospitalized from 11/20-11/30, after being found to be in atrial fibrillation with RVR after being seen by primary care provider for lightheadedness.  She was started on Cardizem drip and anticoagulation.  CT of the chest had noted right layering pleural effusion and right upper lobe pneumonia.  Patient was treated with Rocephin and azithromycin which was completed on 11/27.  Thoracentesis was performed which noted per monitoring fusion.  Echocardiogram noted EF to be 40- 45% with LV global hypokinesis.  Patient had been tried on metoprolol but reported shortness of breath for which it was discontinued.  Patient was placed on Cardizem and transition to Eliquis with plans for outpatient cardioversion.  However patient found to have left-sided hemiparesis and left-sided neglect on 11/25.  Imaging significant for right M1 occlusion, and patient underwent cerebral angiogram with thrombectomy of the right MCA occluded segment with complete revascularization.  MRI revealed Leslie [name] small acute infarct of the right frontal, temporal lobe, and caudate body with chronic small vessel disease.  Neurology had thought symptoms were secondary to atrial fibrillation.  Seems that during her hospitalization amiodarone had been utilized to convert the previously during her hospital stay.  Cardizem has been titrated back up and patient was discharged to inpatient rehab.  Since admitted into inpatient rehab patient had been having issues hypotension for which Cardizem CD  was reduced from 360 mg to 300 mg.  They had been wrapping the patient's legs as well as placing an abdominal binder without significant improvement.  Cardizem dosing had been changed to 75 mg 4 times daily, then 60 mg 4 times daily, and currently she was on 30 mg 4 times daily.  Despite these changes patient was still noted to be orthostatic.  Patient was noted to have heart rates to the 160s today in atrial flutter with blood pressure as low as 84/50.  Patient was bolused IV fluids with improvement in blood pressure.  Labs today noted potassium 3.8, BUN 19, and creatinine 1.36.  Cardiology was consulted and recommended transferring back to Leslie [name] monitored bed.  Orders placed for progressive bed.  Patient reports that she does not feel well but has not felt well for quite some time.  Denies any fever, chest pain, or shortness of breath.  Currently states that she would like some chicken noodle soup  Assessment & Plan:   Active Problems:   Atrial fibrillation with rapid ventricular response ([name])   Hypotension   Heart failure with reduced ejection fraction ([name])   History of CVA with residual deficit   COPD (chronic obstructive pulmonary disease) ([name])   Hypothyroidism  Atrial fibrillation with RVR Patient has been dealing with low blood pressures for which she had been transition from Cardizem CD 360 mg down to Cardizem 30 mg every 6 hours.  In doing so patient's heart rates became uncontrolled and she was noted to still be hypotensive.  Appears during previous hospitalization amiodarone had been noted to have converted patient back into Leslie [name] sinus rhythm.  She had not tolerated metoprolol due to shortness  of breath. -Discontinued Cardizem -appreciate cardiology assistance -> amidoarone gtt, low dose metoprolol.  Diltiazem discontinued. -Goal potassium at least 4 and magnesium at least 2 -Continue Eliquis -Appreciate cardiology consultative services, will follow-up for any further recommendations    Orthostatic Hypotension  Hypotension -hypotension from presentation improved -Continue TED hose and abdominal binder -Goal MAP at least 65 -IV fluid boluses as needed     Heart failure with reduced EF On physical exam patient's lung sounds clear and no significant lower extremity edema appreciated.  Echocardiogram had noted EF to be 40-45 % with global hypokinesis.  Patient was also noted to have moderate to severe mitral regurgitation. -Check I&O's and daily weights - needs repeat echo in 2-3 months - consider ischemic evaluation based on repeat echo   History of CVA with residual deficits Patient had Leslie [name] right middle cerebral artery embolism on 11/25 and underwent thrombectomy. MRI revealed Leslie [name] small acute infarct of the right frontal, temporal lobe, and caudate body with chronic small vessel disease.  -PT/OT/speech to eval and treat   COPD Patient without acute exacerbation.   Hypothyroidism -Check TSH (elevated) - will repeat TSH prior to adjusting the dose -Continue levothyroxine    DVT prophylaxis: eliquis Code Status: full Family Communication: none Disposition:   Status is: Inpatient Remains inpatient appropriate because: persistent RVR   Consultants:  cardiology  Procedures:  none  Antimicrobials:  Anti-infectives (From admission, onward)    None       Subjective: No complaints  Objective: Vitals:   11/24/22 0003 11/24/22 0403 11/24/22 0828 11/24/22 1348  BP: 99/71 118/85 102/79 104/61  Pulse: (!) 102 (!) 119 97 100  Resp: '18 19 15 16  '$ Temp: (!) 97.4 F (36.3 C) 98.1 F (36.7 C) 97.6 F (36.4 C) 97.6 F (36.4 C)  TempSrc: Oral Oral Oral Oral  SpO2: 99% 99% 98% 98%  Weight:  71.5 kg      Intake/Output Summary (Last 24 hours) at 11/24/2022 1440 Last data filed at 11/24/2022 0415 Gross per 24 hour  Intake 434.88 ml  Output 105 ml  Net 329.88 ml   Filed Weights   11/24/22 0403  Weight: 71.5 kg    Examination:  General exam: Appears  calm and comfortable  Respiratory system: unlabored Cardiovascular system: irregularly irregular, tachycardic Gastrointestinal system: Abdomen is nondistended, soft and nontender Central nervous system: Alert and oriented. No focal neurological deficits. Extremities: no LEE   Data Reviewed: I have personally reviewed following labs and imaging studies  CBC: Recent Labs  Lab 11/19/22 0613  WBC 10.4  HGB 13.3  HCT 41.3  MCV 86.6  PLT 638    Basic Metabolic Panel: Recent Labs  Lab 11/19/22 0613 11/20/22 0527 11/23/22 0842 11/23/22 2043  NA 139 139 138  --   K 4.5 3.7 3.8  --   CL 102 103 103  --   CO2 '26 27 27  '$ --   GLUCOSE 112* 116* 134*  --   BUN '18 18 19  '$ --   CREATININE 1.42* 1.21* 1.36*  --   CALCIUM 9.9 9.8 9.8  --   MG  --   --   --  2.1    GFR: Estimated Creatinine Clearance: 32.7 mL/min (Leslie [name]) (by C-G formula based on SCr of 1.36 mg/dL (H)).  Liver Function Tests: No results for input(s): "AST", "ALT", "ALKPHOS", "BILITOT", "PROT", "ALBUMIN" in the last 168 hours.  CBG: No results for input(s): "GLUCAP" in the last 168 hours.   No results found  for this or any previous visit (from the past 240 hour(s)).       Radiology Studies: No results found.      Scheduled Meds:  apixaban  5 mg Oral BID   vitamin B-12  1,000 mcg Oral Daily   famotidine  20 mg Oral Daily   feeding supplement  237 mL Oral TID BM   Gerhardt's butt cream   Topical BID   levothyroxine  88 mcg Oral Q0600   metoprolol tartrate  12.5 mg Oral BID   mouth rinse  15 mL Mouth Rinse 4 times per day   rosuvastatin  10 mg Oral Daily   senna-docusate  2 tablet Oral BID   Continuous Infusions:  amiodarone 30 mg/hr (11/24/22 0612)     LOS: 1 day    Time spent: over 30 min    Leslie Helper, MD [facility]   To contact the attending provider between 7A-7P or the covering provider during after hours 7P-7A, please log into the web site [email or web address] and access using  universal Hand password for that web site. If you do not have the password, please call the hospital operator.  11/24/2022, 2:40 PM

## 2022-11-24 NOTE — Progress Notes (Signed)
In the last 12 hours, patient has only urinated 10m. Patient was bladder scanned and the scan resulted a max of 48 mL in the bladder. Will continue to monitor for changes.

## 2022-11-24 NOTE — Plan of Care (Signed)
  Problem: Education: Goal: Knowledge of General Education information will improve Description: Including pain rating scale, medication(s)/side effects and non-pharmacologic comfort measures Outcome: Progressing   Problem: Health Behavior/Discharge Planning: Goal: Ability to manage health-related needs will improve Outcome: Progressing   Problem: Clinical Measurements: Goal: Ability to maintain clinical measurements within normal limits will improve Outcome: Progressing Goal: Will remain free from infection Outcome: Progressing Goal: Diagnostic test results will improve Outcome: Progressing Goal: Respiratory complications will improve Outcome: Progressing Goal: Cardiovascular complication will be avoided Outcome: Progressing   Problem: Activity: Goal: Risk for activity intolerance will decrease Outcome: Progressing   Problem: Nutrition: Goal: Adequate nutrition will be maintained Outcome: Progressing   Problem: Coping: Goal: Level of anxiety will decrease Outcome: Progressing   Problem: Elimination: Goal: Will not experience complications related to bowel motility Outcome: Progressing Goal: Will not experience complications related to urinary retention Outcome: Progressing   Problem: Pain Managment: Goal: General experience of comfort will improve Outcome: Progressing   Problem: Safety: Goal: Ability to remain free from injury will improve Outcome: Progressing   Problem: Skin Integrity: Goal: Risk for impaired skin integrity will decrease Outcome: Progressing   Problem: Education: Goal: Understanding of CV disease, CV risk reduction, and recovery process will improve Outcome: Progressing Goal: Individualized Educational Video(s) Outcome: Progressing   Problem: Activity: Goal: Ability to return to baseline activity level will improve Outcome: Progressing   Problem: Cardiovascular: Goal: Ability to achieve and maintain adequate cardiovascular perfusion  will improve Outcome: Progressing Goal: Vascular access site(s) Level 0-1 will be maintained Outcome: Progressing   Problem: Health Behavior/Discharge Planning: Goal: Ability to safely manage health-related needs after discharge will improve Outcome: Progressing   Problem: Education: Goal: Knowledge of disease or condition will improve Outcome: Progressing Goal: Knowledge of secondary prevention will improve (MUST DOCUMENT ALL) Outcome: Progressing Goal: Knowledge of patient specific risk factors will improve [place]ta [place] N/A or DELETE if not current risk factor) Outcome: Progressing   Problem: Ischemic Stroke/TIA Tissue Perfusion: Goal: Complications of ischemic stroke/TIA will be minimized Outcome: Progressing   Problem: Coping: Goal: Will verbalize positive feelings about self Outcome: Progressing Goal: Will identify appropriate support needs Outcome: Progressing   Problem: Health Behavior/Discharge Planning: Goal: Ability to manage health-related needs will improve Outcome: Progressing Goal: Goals will be collaboratively established with patient/family Outcome: Progressing   Problem: Self-Care: Goal: Ability to participate in self-care as condition permits will improve Outcome: Progressing Goal: Verbalization of feelings and concerns over difficulty with self-care will improve Outcome: Progressing Goal: Ability to communicate needs accurately will improve Outcome: Progressing   Problem: Nutrition: Goal: Risk of aspiration will decrease Outcome: Progressing Goal: Dietary intake will improve Outcome: Progressing

## 2022-11-24 NOTE — Progress Notes (Signed)
Rounding Note    Patient Name: Leslie [name] Date of Encounter: 11/24/2022  [facility] and [facility] Cardiologist: Peter Martinique, MD   Subjective   BP 102/79.  She denies any chest pain, palpitations, or dyspnea  Inpatient Medications    Scheduled Meds:  apixaban  5 mg Oral BID   vitamin B-12  1,000 mcg Oral Daily   famotidine  20 mg Oral Daily   feeding supplement  237 mL Oral TID BM   Gerhardt's butt cream   Topical BID   levothyroxine  88 mcg Oral Q0600   mouth rinse  15 mL Mouth Rinse 4 times per day   rosuvastatin  10 mg Oral Daily   senna-docusate  2 tablet Oral BID   Continuous Infusions:  amiodarone 30 mg/hr (11/24/22 0612)   PRN Meds: acetaminophen, alum & mag hydroxide-simeth, bisacodyl **OR** sorbitol, calcium carbonate, camphor-menthol, guaiFENesin-dextromethorphan, ipratropium-albuterol, melatonin, mouth rinse, prochlorperazine **OR** prochlorperazine **OR** prochlorperazine, sodium phosphate   Vital Signs    Vitals:   11/23/22 1956 11/24/22 0003 11/24/22 0403 11/24/22 0828  BP: (!) 91/54 99/71 118/85 102/79  Pulse:  (!) 102 (!) 119 97  Resp: '20 18 19 15  '$ Temp: 98.5 F (36.9 C) (!) 97.4 F (36.3 C) 98.1 F (36.7 C) 97.6 F (36.4 C)  TempSrc: Oral Oral Oral Oral  SpO2: 98% 99% 99% 98%  Weight:   71.5 kg     Intake/Output Summary (Last 24 hours) at 11/24/2022 1345 Last data filed at 11/24/2022 0415 Gross per 24 hour  Intake 434.88 ml  Output 105 ml  Net 329.88 ml      11/24/2022    4:03 AM 11/23/2022    5:00 AM 11/20/2022    5:00 AM  Last 3 Weights  Weight (lbs) 157 lb 10.1 oz 144 lb 10 oz 145 lb 15.1 oz  Weight (kg) 71.5 kg 65.6 kg 66.2 kg      Telemetry    Atrial fibrillation, rates 100s to 130s- Personally Reviewed  ECG    No new ECG- Personally Reviewed  Physical Exam   GEN: No acute distress.   Neck: No JVD Cardiac: Irregular, tachycardic no murmurs, rubs, or gallops.  Respiratory: Clear to auscultation  bilaterally. GI: Soft, nontender, non-distended  MS: No edema; No deformity. Neuro:  Nonfocal  Psych: Normal affect   Labs    High Sensitivity Troponin:   Recent Labs  Lab 10/29/22 1552 10/29/22 2018 10/31/22 1540 10/31/22 1736  TROPONINIHS '13 11 12 14     '$ Chemistry Recent Labs  Lab 11/19/22 0613 11/20/22 0527 11/23/22 0842 11/23/22 2043  NA 139 139 138  --   K 4.5 3.7 3.8  --   CL 102 103 103  --   CO2 '26 27 27  '$ --   GLUCOSE 112* 116* 134*  --   BUN '18 18 19  '$ --   CREATININE 1.42* 1.21* 1.36*  --   CALCIUM 9.9 9.8 9.8  --   MG  --   --   --  2.1  GFRNONAA 37* 45* 39*  --   ANIONGAP '11 9 8  '$ --     Lipids No results for input(s): "CHOL", "TRIG", "HDL", "LABVLDL", "LDLCALC", "CHOLHDL" in the last 168 hours.  Hematology Recent Labs  Lab 11/19/22 0613  WBC 10.4  RBC 4.77  HGB 13.3  HCT 41.3  MCV 86.6  MCH 27.9  MCHC 32.2  RDW 15.2  PLT 262   Thyroid  Recent Labs  Lab 11/23/22 2043  TSH 10.315*    BNPNo results for input(s): "BNP", "PROBNP" in the last 168 hours.  DDimer No results for input(s): "DDIMER" in the last 168 hours.   Radiology    No results found.  Cardiac Studies     Patient Profile     81 y.o. female with a history of paroxysmal atrial fibrillation on Eliquis, chronic systolic CHF with EF of 24-82%, mitral regurgitation, CKD stage III, hypothyroidism, GERD, and multiple CVA with recent stroke on 11/03/2022 s/p thrombectomy. Patient was recently admitted from 10/29/2022 to 11/08/2022 for new onset atrial fibrillation and was found to have a reduced EF of 40-45%. Hospitalization was complicated by community acquired pneumonia (which was initially diagnosed prior to this admission) with right pleural effusion s/p thoracentesis on 10/30/2022, and acute stroke requiring thrombectomy. She was discharged to CIR on 11/08/2022. She went back into rapid atrial fibrillation with rates in the 150s on 11/23/2022 and was hypotensive with it. Therefore,  she was readmitted as an inpatient and Cardiology consulted for assistance   Assessment & Plan     Persistent Atrial Fibrillation: Recently admitted last month for new onset atrial fibrillation. Echo showed LVEF of 40-45%. She was started on Diltiazem with improvement in rates as well as Eliquis. She was discharged to CIR due to stroke during last hospitalization. We were reconsulted today for recurrent rapid atrial fibrillation with rates in the 150s with associated hypotension. - Stopped Diltiazem given hypotension and reduced EF. -Continue IV amiodarone -Will add low-dose metoprolol - Continue Eliquis '5mg'$  twice daily.  She was started on Eliquis on 11/22, missed 1 dose at time of her CVA and thrombectomy on 11/25, otherwise on uninterrupted anticoagulation for past 3 weeks - If unable to rate control, may need to consider TEE/DCCV.   Chronic Systolic CHF: Echo during recent admission showed LVEF of 40-45% with global hypokinesis , moderate asymmetric LVH of the basal-septal segment and moderate to severe MR.  -Appears euvolemic - She is not currently on any GDMT due to orthostatic hypotension during CIR and current hypotension in setting of rapid atrial fibrillation. She was on Diltiazem.  Start metoprolol - Cardiomyopathy possibly due to tachyarrhythmias. Would repeat Echo in 2-3 months after restoration of sinus rhythm. If EF has not improved at that time, could consider ischemic evaluation.   Hypotension hypotensive 12/15 in setting of rapid atrial fibrillation with systolic BP in the 50I. However, she has also been struggling with orthostatic hypotension while in CIR. -Improved with IV fluids and stopping diltiazem - Continue TED hose and abdominal binder if needed to help with orthostasis.   Moderate to Severe MR: Noted on Echo during recent admission. -Monitor for improvement once sinus rhythm maintained  For questions or updates, please contact [place] Please consult  [email or web address] for contact info under        Signed, Donato Heinz, MD  11/24/2022, 1:45 PM

## 2022-11-25 DIAGNOSIS — I5020 Unspecified systolic (congestive) heart failure: Secondary | ICD-10-CM | POA: Diagnosis not present

## 2022-11-25 DIAGNOSIS — I4891 Unspecified atrial fibrillation: Secondary | ICD-10-CM | POA: Diagnosis not present

## 2022-11-25 DIAGNOSIS — I951 Orthostatic hypotension: Secondary | ICD-10-CM

## 2022-11-25 DIAGNOSIS — I6930 Unspecified sequelae of cerebral infarction: Secondary | ICD-10-CM | POA: Diagnosis not present

## 2022-11-25 LAB — BASIC METABOLIC PANEL
Anion gap: 6 (ref 5–15)
BUN: 12 mg/dL (ref 8–23)
CO2: 28 mmol/L (ref 22–32)
Calcium: 9.5 mg/dL (ref 8.9–10.3)
Chloride: 107 mmol/L (ref 98–111)
Creatinine, Ser: 1.26 mg/dL — ABNORMAL HIGH (ref 0.44–1.00)
GFR, Estimated: 43 mL/min — ABNORMAL LOW (ref >60)
Glucose, Bld: 109 mg/dL — ABNORMAL HIGH (ref 70–99)
Potassium: 3.8 mmol/L (ref 3.5–5.1)
Sodium: 141 mmol/L (ref 135–145)

## 2022-11-25 LAB — TSH: TSH: 11.301 u[IU]/mL — ABNORMAL HIGH (ref 0.350–4.500)

## 2022-11-25 LAB — CBC
HCT: 38.8 % (ref 36.0–46.0)
Hemoglobin: 12.2 g/dL (ref 12.0–15.0)
MCH: 27.7 pg (ref 26.0–34.0)
MCHC: 31.4 g/dL (ref 30.0–36.0)
MCV: 88 fL (ref 80.0–100.0)
Platelets: 219 10*3/uL (ref 150–400)
RBC: 4.41 MIL/uL (ref 3.87–5.11)
RDW: 15.6 % — ABNORMAL HIGH (ref 11.5–15.5)
WBC: 9.9 10*3/uL (ref 4.0–10.5)
nRBC: 0 % (ref 0.0–0.2)

## 2022-11-25 LAB — PROTIME-INR
INR: 1.3 — ABNORMAL HIGH (ref 0.8–1.2)
Prothrombin Time: 16.4 seconds — ABNORMAL HIGH (ref 11.4–15.2)

## 2022-11-25 LAB — T4, FREE: Free T4: 1.06 ng/dL (ref 0.61–1.12)

## 2022-11-25 MED ORDER — LEVOTHYROXINE SODIUM 100 MCG PO TABS
100.0000 ug | ORAL_TABLET | Freq: Every day | ORAL | Status: DC
  Administered 2022-11-26 – 2022-12-10 (×14): 100 ug via ORAL
  Filled 2022-11-25 (×14): qty 1

## 2022-11-25 MED ORDER — SODIUM CHLORIDE 0.9 % IV SOLN: INTRAVENOUS | Status: DC

## 2022-11-25 NOTE — Progress Notes (Signed)
PROGRESS NOTE    [name]  QIW:[id number] DOB: [birth year] DOA: 11/23/2022 PCP: Sonia Side., FNP  No chief complaint on file.   Brief Narrative:  Leslie [name] is Leslie [name] 81 y.o. female with medical history significant of CVA, atrial flutter fibrillation/flutter, hypothyroidism, GERD who is being transferred back to inpatient side after being found to be in atrial flutter with heart rates into the 160s and low blood pressures.   Just recently been hospitalized from 11/20-11/30, after being found to be in atrial fibrillation with RVR after being seen by primary care provider for lightheadedness.  She was started on Cardizem drip and anticoagulation.  CT of the chest had noted right layering pleural effusion and right upper lobe pneumonia.  Patient was treated with Rocephin and azithromycin which was completed on 11/27.  Thoracentesis was performed which noted per monitoring fusion.  Echocardiogram noted EF to be 40- 45% with LV global hypokinesis.  Patient had been tried on metoprolol but reported shortness of breath for which it was discontinued.  Patient was placed on Cardizem and transition to Eliquis with plans for outpatient cardioversion.  However patient found to have left-sided hemiparesis and left-sided neglect on 11/25.  Imaging significant for right M1 occlusion, and patient underwent cerebral angiogram with thrombectomy of the right MCA occluded segment with complete revascularization.  MRI revealed Dezirae Service small acute infarct of the right frontal, temporal lobe, and caudate body with chronic small vessel disease.  Neurology had thought symptoms were secondary to atrial fibrillation.  Seems that during her hospitalization amiodarone had been utilized to convert the previously during her hospital stay.  Cardizem has been titrated back up and patient was discharged to inpatient rehab.  Since admitted into inpatient rehab patient had been having issues hypotension for which Cardizem CD  was reduced from 360 mg to 300 mg.  They had been wrapping the patient's legs as well as placing an abdominal binder without significant improvement.  Cardizem dosing had been changed to 75 mg 4 times daily, then 60 mg 4 times daily, and currently she was on 30 mg 4 times daily.  Despite these changes patient was still noted to be orthostatic.  Patient was noted to have heart rates to the 160s today in atrial flutter with blood pressure as low as 84/50.  Patient was bolused IV fluids with improvement in blood pressure.  Labs today noted potassium 3.8, BUN 19, and creatinine 1.36.  Cardiology was consulted and recommended transferring back to Leslie [name] monitored bed.  Orders placed for progressive bed.  Patient reports that she does not feel well but has not felt well for quite some time.  Denies any fever, chest pain, or shortness of breath.  Currently states that she would like some chicken noodle soup  Assessment & Plan:   Active Problems:   Atrial fibrillation with rapid ventricular response ([name])   Hypotension   Heart failure with reduced ejection fraction ([name])   History of CVA with residual deficit   COPD (chronic obstructive pulmonary disease) ([name])   Hypothyroidism  Atrial fibrillation with RVR Patient has been dealing with low blood pressures for which she had been transition from Cardizem CD 360 mg down to Cardizem 30 mg every 6 hours.  In doing so patient's heart rates became uncontrolled and she was noted to still be hypotensive.  Appears during previous hospitalization amiodarone had been noted to have converted patient back into Albi Rappaport sinus rhythm.  She had not tolerated metoprolol due to shortness  of breath. -Discontinued Cardizem -appreciate cardiology assistance -> amidoarone gtt, low dose metoprolol d/c'd with hypotension below.  Diltiazem discontinued. -Goal potassium at least 4 and magnesium at least 2 -Continue Eliquis -Appreciate cardiology consultative services, will follow-up for any  further recommendations -> planning for TEE cardioversion   Delirium  - delirium precautions - will order Tyre Beaver sitter - w/u additionally if persistent  Orthostatic Hypotension  Hypotension - hypotension from presentation improved - Continue TED hose and abdominal binder - continued orthostasis today with therapy despite ted hose and abdominal binder.  Stop beta blocker.  If persistent with antihypertensives d/c'd, consider midodrine. -Goal MAP at least 65   Heart failure with reduced EF On physical exam patient's lung sounds clear and no significant lower extremity edema appreciated.  Echocardiogram had noted EF to be 40-45 % with global hypokinesis.  Patient was also noted to have moderate to severe mitral regurgitation. -Check I&O's and daily weights - needs repeat echo in 2-3 months - consider ischemic evaluation based on repeat echo (concern cardiomyopathy related to tachyarrhythmia, repeat echo 2-3 months after restoration of sinus rhythm)   History of CVA with residual deficits Patient had Leslie [name] right middle cerebral artery embolism on 11/25 and underwent thrombectomy. MRI revealed Leslie [name] small acute infarct of the right frontal, temporal lobe, and caudate body with chronic small vessel disease.  -PT/OT/speech to eval and treat   COPD Patient without acute exacerbation.   Hypothyroidism -Check TSH (elevated) -Continue levothyroxine (increase dose)    DVT prophylaxis: eliquis Code Status: full Family Communication: none Disposition:   Status is: Inpatient Remains inpatient appropriate because: persistent RVR   Consultants:  cardiology  Procedures:  none  Antimicrobials:  Anti-infectives (From admission, onward)    None       Subjective: No complaints  Objective: Vitals:   11/25/22 0953 11/25/22 1000 11/25/22 1045 11/25/22 1500  BP:  92/77 (!) 119/96 103/73  Pulse: (!) 109  (!) 101 61  Resp:  18  20  Temp:    (!) 97.4 F (36.3 C)  TempSrc:    Oral  SpO2:     97%  Weight:        Intake/Output Summary (Last 24 hours) at 11/25/2022 1758 Last data filed at 11/25/2022 0446 Gross per 24 hour  Intake --  Output 250 ml  Net -250 ml   Filed Weights   11/24/22 0403 11/25/22 0443  Weight: 71.5 kg 66.3 kg    Examination:  General: No acute distress. Cardiovascular: Heart sounds show Donavyn Fecher regular rate, and rhythm. No gallops or rubs. No murmurs. No JVD. Lungs: unlabored Abdomen: Soft, nontender, nondistended  Neurological: Alert, moving all extremities Extremities: No clubbing or cyanosis. No edema.   Data Reviewed: I have personally reviewed following labs and imaging studies  CBC: Recent Labs  Lab 11/19/22 0613 11/25/22 0130  WBC 10.4 9.9  HGB 13.3 12.2  HCT 41.3 38.8  MCV 86.6 88.0  PLT 262 962    Basic Metabolic Panel: Recent Labs  Lab 11/19/22 0613 11/20/22 0527 11/23/22 0842 11/23/22 2043 11/25/22 0130  NA 139 139 138  --  141  K 4.5 3.7 3.8  --  3.8  CL 102 103 103  --  107  CO2 '26 27 27  '$ --  28  GLUCOSE 112* 116* 134*  --  109*  BUN '18 18 19  '$ --  12  CREATININE 1.42* 1.21* 1.36*  --  1.26*  CALCIUM 9.9 9.8 9.8  --  9.5  MG  --   --   --  2.1  --     GFR: Estimated Creatinine Clearance: 35.3 mL/min (Melanni Benway) (by C-G formula based on SCr of 1.26 mg/dL (H)).  Liver Function Tests: No results for input(s): "AST", "ALT", "ALKPHOS", "BILITOT", "PROT", "ALBUMIN" in the last 168 hours.  CBG: No results for input(s): "GLUCAP" in the last 168 hours.   No results found for this or any previous visit (from the past 240 hour(s)).       Radiology Studies: No results found.      Scheduled Meds:  apixaban  5 mg Oral BID   vitamin B-12  1,000 mcg Oral Daily   famotidine  20 mg Oral Daily   feeding supplement  237 mL Oral TID BM   Gerhardt's butt cream   Topical BID   [START ON 11/26/2022] levothyroxine  100 mcg Oral Q0600   mouth rinse  15 mL Mouth Rinse 4 times per day   rosuvastatin  10 mg Oral Daily    senna-docusate  2 tablet Oral BID   Continuous Infusions:  amiodarone 30 mg/hr (11/25/22 1644)     LOS: 2 days    Time spent: over 30 min    Fayrene Helper, MD [facility]   To contact the attending provider between 7A-7P or the covering provider during after hours 7P-7A, please log into the web site [email or web address] and access using universal [place] password for that web site. If you do not have the password, please call the hospital operator.  11/25/2022, 5:58 PM

## 2022-11-25 NOTE — Progress Notes (Signed)
Rounding Note    Patient Name: Leslie [name] Date of Encounter: 11/25/2022  [facility] Cardiologist: Peter Martinique, MD   Subjective   Still with significant orthostatic hypotension with sitting: Supine: 94/78 (85), Sitting: 68/59 (64), Sitting x 5 minutes: 71/58 (63).  Reports lightheadedness with sitting up.  Denies any chest pain or dyspnea  Inpatient Medications    Scheduled Meds:  apixaban  5 mg Oral BID   vitamin B-12  1,000 mcg Oral Daily   famotidine  20 mg Oral Daily   feeding supplement  237 mL Oral TID BM   Gerhardt's butt cream   Topical BID   [START ON 11/26/2022] levothyroxine  100 mcg Oral Q0600   mouth rinse  15 mL Mouth Rinse 4 times per day   rosuvastatin  10 mg Oral Daily   senna-docusate  2 tablet Oral BID   Continuous Infusions:  amiodarone 30 mg/hr (11/25/22 0558)   PRN Meds: acetaminophen, alum & mag hydroxide-simeth, bisacodyl **OR** sorbitol, calcium carbonate, camphor-menthol, guaiFENesin-dextromethorphan, ipratropium-albuterol, melatonin, mouth rinse, prochlorperazine **OR** prochlorperazine **OR** prochlorperazine, sodium phosphate   Vital Signs    Vitals:   11/25/22 0900 11/25/22 0953 11/25/22 1000 11/25/22 1045  BP: 114/88  92/77 (!) 119/96  Pulse: (!) 102 (!) 109  (!) 101  Resp: 17  18   Temp: 98 F (36.7 C)     TempSrc: Oral     SpO2: 99%     Weight:        Intake/Output Summary (Last 24 hours) at 11/25/2022 1443 Last data filed at 11/25/2022 0446 Gross per 24 hour  Intake 236 ml  Output 750 ml  Net -514 ml       11/25/2022    4:43 AM 11/24/2022    4:03 AM 11/23/2022    5:00 AM  Last 3 Weights  Weight (lbs) 146 lb 2.6 oz 157 lb 10.1 oz 144 lb 10 oz  Weight (kg) 66.3 kg 71.5 kg 65.6 kg      Telemetry    Atrial fibrillation, rates 90-110s- Personally Reviewed  ECG    No new ECG- Personally Reviewed  Physical Exam   GEN: No acute distress.   Neck: No JVD Cardiac: Irregular, tachycardic no  murmurs, rubs, or gallops.  Respiratory: Clear to auscultation bilaterally. GI: Soft, nontender, non-distended  MS: No edema; No deformity. Neuro:  Nonfocal  Psych: Normal affect   Labs    High Sensitivity Troponin:   Recent Labs  Lab 10/29/22 1552 10/29/22 2018 10/31/22 1540 10/31/22 1736  TROPONINIHS '13 11 12 14      '$ Chemistry Recent Labs  Lab 11/20/22 0527 11/23/22 0842 11/23/22 2043 11/25/22 0130  NA 139 138  --  141  K 3.7 3.8  --  3.8  CL 103 103  --  107  CO2 27 27  --  28  GLUCOSE 116* 134*  --  109*  BUN 18 19  --  12  CREATININE 1.21* 1.36*  --  1.26*  CALCIUM 9.8 9.8  --  9.5  MG  --   --  2.1  --   GFRNONAA 45* 39*  --  43*  ANIONGAP 9 8  --  6     Lipids No results for input(s): "CHOL", "TRIG", "HDL", "LABVLDL", "LDLCALC", "CHOLHDL" in the last 168 hours.  Hematology Recent Labs  Lab 11/19/22 0613 11/25/22 0130  WBC 10.4 9.9  RBC 4.77 4.41  HGB 13.3 12.2  HCT 41.3 38.8  MCV 86.6 88.0  MCH 27.9 27.7  MCHC 32.2 31.4  RDW 15.2 15.6*  PLT 262 219    Thyroid  Recent Labs  Lab 11/25/22 0130  TSH 11.301*  FREET4 1.06     BNPNo results for input(s): "BNP", "PROBNP" in the last 168 hours.  DDimer No results for input(s): "DDIMER" in the last 168 hours.   Radiology    No results found.  Cardiac Studies     Patient Profile     81 y.o. female with a history of paroxysmal atrial fibrillation on Eliquis, chronic systolic CHF with EF of 74-08%, mitral regurgitation, CKD stage III, hypothyroidism, GERD, and multiple CVA with recent stroke on 11/03/2022 s/p thrombectomy. Patient was recently admitted from 10/29/2022 to 11/08/2022 for new onset atrial fibrillation and was found to have a reduced EF of 40-45%. Hospitalization was complicated by community acquired pneumonia (which was initially diagnosed prior to this admission) with right pleural effusion s/p thoracentesis on 10/30/2022, and acute stroke requiring thrombectomy. She was discharged  to CIR on 11/08/2022. She went back into rapid atrial fibrillation with rates in the 150s on 11/23/2022 and was hypotensive with it. Therefore, she was readmitted as an inpatient and Cardiology consulted for assistance   Assessment & Plan     Persistent Atrial Fibrillation: Recently admitted last month for new onset atrial fibrillation. Echo showed LVEF of 40-45%. She was started on Diltiazem with improvement in rates as well as Eliquis. She was discharged to CIR due to stroke during last hospitalization. We were reconsulted today for recurrent rapid atrial fibrillation with rates in the 150s with associated hypotension. - Stopped Diltiazem given hypotension and reduced EF. - Continue IV amiodarone - Added low dose metoprolol but with significant orthostatic hypotension, will discontinue - Continue Eliquis '5mg'$  twice daily.  She was started on Eliquis on 11/22, missed 1 dose at time of her CVA and thrombectomy on 11/25, otherwise on uninterrupted anticoagulation for past 3 weeks -Given difficulties with rate control and her significant orthostatic hypotension, recommend cardioversion to maintain sinus rhythm.  Though she has now been on 3 weeks of uninterrupted anticoagulation, the presumption given her CVA on 11/25 was that she had a left atrial appendage thrombus, and would recommend we do TEE to ensure no thrombus prior to cardioversion.   Chronic Systolic CHF: Echo during recent admission showed LVEF of 40-45% with global hypokinesis , moderate asymmetric LVH of the basal-septal segment and moderate to severe MR.  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 Would repeat Echo in 2-3 months after restoration of sinus rhythm. If EF has not improved at that time, could consider ischemic evaluation.   Hypotension hypotensive 12/15 in setting of rapid atrial fibrillation with systolic BP in the 14G. However,  she has also been struggling with orthostatic hypotension while in CIR. - Improved with IV fluids and stopping diltiazem - Continue TED hose and abdominal binder if needed to help with orthostasis.   Moderate to Severe MR: Noted on Echo during recent admission. -Monitor for improvement once sinus rhythm maintained  For questions or updates, please contact [place] Please consult [email or web address] for contact info under        Signed, Donato Heinz, MD  11/25/2022, 2:43 PM

## 2022-11-25 NOTE — H&P (View-Only) (Signed)
Rounding Note    Patient Name: Leslie [name] Date of Encounter: 11/25/2022  [facility] Cardiologist: Peter Martinique, MD   Subjective   Patient feels better today. Continues to have mild palpitations. Planned for TEE/DCCV today.  Remains in Afib with RVR with HR 110s BP 90-110s Cr stable 1.25  Inpatient Medications    Scheduled Meds:  apixaban  5 mg Oral BID   vitamin B-12  1,000 mcg Oral Daily   famotidine  20 mg Oral Daily   feeding supplement  237 mL Oral TID BM   Gerhardt's butt cream   Topical BID   [START ON 11/26/2022] levothyroxine  100 mcg Oral Q0600   mouth rinse  15 mL Mouth Rinse 4 times per day   rosuvastatin  10 mg Oral Daily   senna-docusate  2 tablet Oral BID   Continuous Infusions:  sodium chloride     amiodarone 30 mg/hr (11/25/22 1644)   PRN Meds: acetaminophen, alum & mag hydroxide-simeth, bisacodyl **OR** sorbitol, calcium carbonate, camphor-menthol, guaiFENesin-dextromethorphan, ipratropium-albuterol, melatonin, mouth rinse, prochlorperazine **OR** prochlorperazine **OR** prochlorperazine, sodium phosphate   Vital Signs    Vitals:   11/25/22 0953 11/25/22 1000 11/25/22 1045 11/25/22 1500  BP:  92/77 (!) 119/96 103/73  Pulse: (!) 109  (!) 101 61  Resp:  18  20  Temp:    (!) 97.4 F (36.3 C)  TempSrc:    Oral  SpO2:    97%  Weight:        Intake/Output Summary (Last 24 hours) at 11/25/2022 1954 Last data filed at 11/25/2022 0446 Gross per 24 hour  Intake --  Output 250 ml  Net -250 ml       11/25/2022    4:43 AM 11/24/2022    4:03 AM 11/23/2022    5:00 AM  Last 3 Weights  Weight (lbs) 146 lb 2.6 oz 157 lb 10.1 oz 144 lb 10 oz  Weight (kg) 66.3 kg 71.5 kg 65.6 kg      Telemetry    Afib with HR 90-110s- Personally Reviewed  ECG    No new tracing today- Personally Reviewed  Physical Exam   GEN: No acute distress.   Neck: No JVD Cardiac: Irregular, tachycardic. No murmurs  Respiratory: Clear to  auscultation bilaterally. GI: Soft, nontender, non-distended  MS: No edema; No deformity. Neuro:  Nonfocal  Psych: Normal affect   Labs    High Sensitivity Troponin:   Recent Labs  Lab 10/29/22 1552 10/29/22 2018 10/31/22 1540 10/31/22 1736  TROPONINIHS '13 11 12 14      '$ Chemistry Recent Labs  Lab 11/20/22 0527 11/23/22 0842 11/23/22 2043 11/25/22 0130  NA 139 138  --  141  K 3.7 3.8  --  3.8  CL 103 103  --  107  CO2 27 27  --  28  GLUCOSE 116* 134*  --  109*  BUN 18 19  --  12  CREATININE 1.21* 1.36*  --  1.26*  CALCIUM 9.8 9.8  --  9.5  MG  --   --  2.1  --   GFRNONAA 45* 39*  --  43*  ANIONGAP 9 8  --  6     Lipids No results for input(s): "CHOL", "TRIG", "HDL", "LABVLDL", "LDLCALC", "CHOLHDL" in the last 168 hours.  Hematology Recent Labs  Lab 11/19/22 0613 11/25/22 0130  WBC 10.4 9.9  RBC 4.77 4.41  HGB 13.3 12.2  HCT 41.3 38.8  MCV 86.6 88.0  MCH  27.9 27.7  MCHC 32.2 31.4  RDW 15.2 15.6*  PLT 262 219    Thyroid  Recent Labs  Lab 11/25/22 0130  TSH 11.301*  FREET4 1.06     BNPNo results for input(s): "BNP", "PROBNP" in the last 168 hours.  DDimer No results for input(s): "DDIMER" in the last 168 hours.   Radiology    No results found.  Cardiac Studies     Patient Profile     81 y.o. female with a history of paroxysmal atrial fibrillation on Eliquis, chronic systolic CHF with EF of 26-83%, mitral regurgitation, CKD stage III, hypothyroidism, GERD, and multiple CVA with recent stroke on 11/03/2022 s/p thrombectomy. Patient was recently admitted from 10/29/2022 to 11/08/2022 for new onset atrial fibrillation and was found to have a reduced EF of 40-45%. Hospitalization was complicated by community acquired pneumonia (which was initially diagnosed prior to this admission) with right pleural effusion s/p thoracentesis on 10/30/2022, and acute stroke requiring thrombectomy. She was discharged to CIR on 11/08/2022. She went back into rapid  atrial fibrillation with rates in the 150s on 11/23/2022 and was hypotensive with it. Therefore, she was readmitted as an inpatient and Cardiology consulted for assistance   Assessment & Plan     #Persistent Atrial Fibrillation:  Patient with recent admission for new onset atrial fibrillation. Echo showed LVEF of 40-45%. She was started on Diltiazem and apixaban. She was discharged to CIR but represented on this admission for recurrent rapid atrial fibrillation with rates in the 150s with associated hypotension for which Cardiology has been consulted. Given difficulty with rate control and soft blood pressures limiting nodal agents, she is planned for TEE/DCCV today. - Stopped Diltiazem given hypotension and reduced EF - Continue IV amiodarone; will transition to PO post DCCV - Continue Eliquis '5mg'$  twice daily.  She was started on Eliquis on 11/22, missed 1 dose at time of her CVA and thrombectomy on 11/25, otherwise on uninterrupted anticoagulation for past 3 weeks - Plan for TEE/DCCV due to difficulty with rate control and hypotension limiting nodal agents   #Chronic Systolic CHF:  Echo during recent admission showed LVEF of 40-45% with global hypokinesis , moderate asymmetric LVH of the basal-septal segment and moderate to severe MR. Currently euvolemic on exam. Unable to tolerate GDMT due to hypotension. 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 Dilt stopped. Will plan for TEE/DCCV as detailed above. - Improved with IV fluids and stopping diltiazem - Continue TED hose and abdominal binder if needed to help with orthostasis.   #Moderate to Severe MR: Noted on Echo during recent admission. -Will follow-up repeat TTE once NSR  restored to reasses  For questions or updates, please contact [place] Please consult [email or web address] for contact info under        Signed, Freada Bergeron, MD  11/25/2022, 7:54 PM

## 2022-11-25 NOTE — Progress Notes (Signed)
OT Cancellation Note  Patient Details Name: Leslie [name] MRN: [id number] DOB: [birth year]   Cancelled Treatment:    Reason Eval/Treat Not Completed: Other (comment). Pt just finishing up with PT when I entered. Back in bed due to orthostasis and increase confusion. Will re-attempt eval tomorrow.  [name], OTR/L [facility] [phone] Office [phone]    Almon Register 11/25/2022, 2:10 PM

## 2022-11-25 NOTE — Progress Notes (Deleted)
Cardiology Clinic Note   Patient Name: Leslie [name] Date of Encounter: 11/25/2022  Primary Care Provider:  Sonia Side., FNP Primary Cardiologist:  Leslie Martinique, MD  Patient Profile    Leslie [name] 81 year old female presents the clinic today for follow-up evaluation of her palpitations.  Past Medical History    Past Medical History:  Diagnosis Date   Arthritis    Back pain due to injury    fell off a ladder and fractured back-T12 down (multiple surgeries w/ onging pain)   Chronic kidney disease    "stage 3 kidney failure" improving per pt   Hypothyroidism    Insomnia    Plantar fasciitis    Stomach ulcer ~2000   Stroke [facility])    TIA X2   2001, jan 2017   Vertigo, benign paroxysmal, bilateral    Vitamin D deficiency    Past Surgical History:  Procedure Laterality Date   ABDOMINAL HYSTERECTOMY  1974   ANKLE FUSION Right 06/07/2016   Procedure: RIGHT TALONAVICULAR ARTHRODESIS ;  Surgeon: Leslie Simmer, MD;  Location: [place];  Service: Orthopedics;  Laterality: Right;   APPENDECTOMY     BACK SURGERY     x4  fusion T12 thru sacrum; U Leslie in sacrum   CATARACT EXTRACTION, BILATERAL Bilateral 2015   GASTROCNEMIUS RECESSION Right 06/07/2016   Procedure: RIGHT GASTROC RECESSION ;  Surgeon: Leslie Simmer, MD;  Location: [place];  Service: Orthopedics;  Laterality: Right;   HAND SURGERY Left 2003   x2   IR ANGIO EXTRACRAN SEL COM CAROTID INNOMINATE UNI L MOD SED  11/03/2022   IR CT HEAD LTD  11/03/2022   IR PERCUTANEOUS ART THROMBECTOMY/INFUSION INTRACRANIAL INC DIAG ANGIO  11/03/2022   L heel surgery for torn tendon Left 2015   PLANTAR FASCIA SURGERY Right 2009   surgery at [place] 11/03/2022   Procedure: IR WITH ANESTHESIA;  Surgeon: Radiologist, Medication, MD;  Location: [place];  Service: Radiology;  Laterality: N/A;   SHOULDER ARTHROSCOPY Right 2007   TENDON REPAIR Right 1949   foot-  lacerated tendon in arch of foot   THORACENTESIS N/A 10/30/2022   Procedure: THORACENTESIS;  Surgeon: Leslie Hy, DO;  Location: [place];  Service: Cardiopulmonary;  Laterality: N/A;   TOTAL HIP ARTHROPLASTY Left 10/05/2021   Procedure: TOTAL HIP ARTHROPLASTY ANTERIOR APPROACH;  Surgeon: Leslie Can, MD;  Location: [facility];  Service: Orthopedics;  Laterality: Left;    Allergies  Allergies  Allergen Reactions   Duloxetine Nausea Only   Voltaren [Diclofenac Sodium] Shortness Of Breath and Palpitations   Lisinopril Other (See Comments)   Aspirin Other (See Comments)    ulcers   Diphenhydramine Other (See Comments)   Gabapentin Itching   Metoprolol     Cold sweat/exhaustion    Ultram [Tramadol Hcl] Other (See Comments)    Effects cognitive abilities    Proton Pump Inhibitors Rash    Itching, rash    History of Present Illness    Leslie [name] has a PMH of atrial fibrillation with rapid ventricular response, hypotension, heart failure with reduced ejection fraction, COPD, hypothyroidism, CKD, and CVA.  Echocardiogram showed an LVEF of 40-45%.  She was admitted to the hospital 10/29/2022 until 11/08/2022 with new onset atrial fibrillation.  Her hospitalization was complicated by community-acquired pneumonia.  She developed right pleural effusion status post thoracentesis on 10/30/2022 and acute CVA requiring thrombectomy.  She was discharged  on 11/08/2022.  She went back into atrial fibrillation with RVR in the 150s 11/23/2022.  She became hypotensive.  She was started on apixaban.  Plan for TEE DCCV has been discussed.  She presents to the clinic today for follow-up evaluation states***  *** denies chest pain, shortness of breath, lower extremity edema, fatigue, palpitations, melena, hematuria, hemoptysis, diaphoresis, weakness, presyncope, syncope, orthopnea, and PND.  Atrial fibrillation with RVR-EKG today shows***.  Compliant with apixaban and denies bleeding  issues. Continue amiodarone, apixaban Heart healthy low-sodium diet-salty 6 given Increase physical activity as tolerated  Chronic systolic CHF-no increased DOE or activity intolerance.  Echocardiogram showed an LVEF of 40-45% with global hypokinesis and moderate systemic LVH, moderate-severe MR.  Weight stable.  Plan for repeat echocardiogram once patient has returned to sinus rhythm Continue diltiazem Heart healthy low-sodium diet-salty 6 given Increase physical activity as tolerated  Hypotension-BP today***.  Noted to have chronic orthostatic hypotension which was noted to be worse in the setting of atrial fibrillation with RVR.  Diltiazem was stopped. Continued lower extremity support stockings, abdominal binder Change positions slowly  Mitral valve regurgitation-noted to be moderate-severe on recent echocardiogram. Plan repeat echocardiogram to reevaluate regurgitation once in sinus rhythm Maintain physical activity  Disposition: Follow-up with Dr. Martinique or me in 1-2 months. Home Medications    Prior to Admission medications   Medication Sig Start Date End Date Taking? Authorizing Provider  acetaminophen (TYLENOL) 325 MG tablet Take 1-2 tablets (325-650 mg total) by mouth every 4 (four) hours as needed for mild pain. 11/13/22   Love, Leslie Anchors, PA-C  apixaban (ELIQUIS) 5 MG TABS tablet Take 1 tablet (5 mg total) by mouth 2 (two) times daily. 11/08/22 12/08/22  Arrien, Leslie Picket, MD  diltiazem (CARDIZEM CD) 360 MG 24 hr capsule Take 1 capsule (360 mg total) by mouth daily. 11/09/22 12/09/22  Arrien, Leslie Picket, MD  famotidine (PEPCID) 20 MG tablet Take 20 mg by mouth daily as needed for indigestion or heartburn.    [provider]  levothyroxine (SYNTHROID) 88 MCG tablet Take 88 mcg by mouth daily before breakfast.    [provider]  Mouthwashes (MOUTH RINSE) LIQD solution 15 mLs by Mouth Rinse route 4 (four) times daily - after meals and at bedtime.  11/13/22   Love, Leslie Anchors, PA-C  rosuvastatin (CRESTOR) 10 MG tablet Take 1 tablet (10 mg total) by mouth daily. 11/09/22 12/09/22  Arrien, Leslie Picket, MD  spironolactone (ALDACTONE) 25 MG tablet Take 1 tablet (25 mg total) by mouth daily. 11/09/22 12/09/22  Arrien, Leslie Picket, MD    Family History    Family History  Problem Relation Age of Onset   Hypertension Mother    Lung cancer Mother    Lung cancer Father    Diabetes Sister    Hypertension Sister    Congestive Heart Failure Sister    Arthritis Brother    She indicated that her mother is deceased. She indicated that her father is deceased. She indicated that her sister is alive. She indicated that her brother is alive.  Social History    Social History   Socioeconomic History   Marital status: Widowed    Spouse name: Not on file   Number of children: 1   Years of education: HS+   Highest education level: Not on file  Occupational History   Occupation: Retired   Tobacco Use   Smoking status: Former    Types: Cigarettes    Quit date: 06/04/1986  Years since quitting: 36.5   Smokeless tobacco: Never   Tobacco comments:    Only smoked a few years  Vaping Use   Vaping Use: Never used  Substance and Sexual Activity   Alcohol use: No    Alcohol/week: 0.0 standard drinks of alcohol   Drug use: No   Sexual activity: Not on file  Other Topics Concern   Not on file  Social History Narrative   Drinks about 3 cups of coffee a day    Social Determinants of Health   Financial Resource Strain: Not on file  Food Insecurity: No Food Insecurity (11/04/2022)   Hunger Vital Sign    Worried About Running Out of Food in the Last Year: Never true    Ran Out of Food in the Last Year: Never true  Transportation Needs: No Transportation Needs (11/04/2022)   PRAPARE - Hydrologist (Medical): No    Lack of Transportation (Non-Medical): No  Physical Activity: Not on file  Stress: Not on file   Social Connections: Not on file  Intimate Partner Violence: Not At Risk (11/04/2022)   Humiliation, Afraid, Rape, and Kick questionnaire    Fear of Current or Ex-Partner: No    Emotionally Abused: No    Physically Abused: No    Sexually Abused: No     Review of Systems    General:  No chills, fever, night sweats or weight changes.  Cardiovascular:  No chest pain, dyspnea on exertion, edema, orthopnea, palpitations, paroxysmal nocturnal dyspnea. Dermatological: No rash, lesions/masses Respiratory: No cough, dyspnea Urologic: No hematuria, dysuria Abdominal:   No nausea, vomiting, diarrhea, bright red blood per rectum, melena, or hematemesis Neurologic:  No visual changes, wkns, changes in mental status. All other systems reviewed and are otherwise negative except as noted above.  Physical Exam    VS:  There were no vitals taken for this visit. , BMI There is no height or weight on file to calculate BMI. GEN: Well nourished, well developed, in no acute distress. HEENT: normal. Neck: Supple, no JVD, carotid bruits, or masses. Cardiac: RRR, no murmurs, rubs, or gallops. No clubbing, cyanosis, edema.  Radials/DP/PT 2+ and equal bilaterally.  Respiratory:  Respirations regular and unlabored, clear to auscultation bilaterally. GI: Soft, nontender, nondistended, BS + x 4. MS: no deformity or atrophy. Skin: warm and dry, no rash. Neuro:  Strength and sensation are intact. Psych: Normal affect.  Accessory Clinical Findings    Recent Labs: 11/01/2022: B Natriuretic Peptide 791.8 11/09/2022: ALT 28 11/23/2022: Magnesium 2.1 11/25/2022: BUN 12; Creatinine, Ser 1.26; Hemoglobin 12.2; Platelets 219; Potassium 3.8; Sodium 141; TSH 11.301   Recent Lipid Panel    Component Value Date/Time   CHOL 151 10/31/2022 0108   TRIG 61 10/31/2022 0108   HDL 53 10/31/2022 0108   CHOLHDL 2.8 10/31/2022 0108   VLDL 12 10/31/2022 0108   LDLCALC 86 10/31/2022 0108         ECG personally  reviewed by me today- *** - No acute changes  Echocardiogram 10/30/2022 IMPRESSIONS     1. Left ventricular ejection fraction, by estimation, is 40 to 45%. The  left ventricle has mildly decreased function. The left ventricle  demonstrates global hypokinesis. There is moderate asymmetric left  ventricular hypertrophy of the basal-septal  segment. Left ventricular diastolic parameters are indeterminate.   2. Right ventricular systolic function is normal. The right ventricular  size is normal. There is normal pulmonary artery systolic pressure.   3. Left  atrial size was mildly dilated.   4. The mitral valve is abnormal. Moderate to severe mitral valve  regurgitation. No evidence of mitral stenosis.   5. The aortic valve was not well visualized. Aortic valve regurgitation  is trivial. No aortic stenosis is present.   6. The inferior vena cava is dilated in size with >50% respiratory  variability, suggesting right atrial pressure of 8 mmHg.   Comparison(s): MR is worse from prior report.   FINDINGS   Left Ventricle: Left ventricular ejection fraction, by estimation, is 40  to 45%. The left ventricle has mildly decreased function. The left  ventricle demonstrates global hypokinesis. The left ventricular internal  cavity size was normal in size. There is   moderate asymmetric left ventricular hypertrophy of the basal-septal  segment. Left ventricular diastolic parameters are indeterminate.   Right Ventricle: The right ventricular size is normal. No increase in  right ventricular wall thickness. Right ventricular systolic function is  normal. There is normal pulmonary artery systolic pressure. The tricuspid  regurgitant velocity is 2.72 m/s, and   with an assumed right atrial pressure of 3 mmHg, the estimated right  ventricular systolic pressure is 49.7 mmHg.   Left Atrium: Left atrial size was mildly dilated.   Right Atrium: Right atrial size was normal in size.   Pericardium: The  pericardium was not well visualized.   Mitral Valve: MR volume 52. The mitral valve is abnormal. Mild mitral  annular calcification. 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 No  evidence of mitral valve stenosis.   Tricuspid Valve: The tricuspid valve is not well visualized. Tricuspid  valve regurgitation is not demonstrated. No evidence of tricuspid  stenosis.   Aortic Valve: The aortic valve was not well visualized. Aortic valve  regurgitation is trivial. No aortic stenosis is present.   Pulmonic Valve: The pulmonic valve was not well visualized. Pulmonic valve  regurgitation is not visualized. No evidence of pulmonic stenosis.   Aorta: The aortic root, ascending aorta and aortic arch are all  structurally normal, with no evidence of dilitation or obstruction.   Venous: The inferior vena cava is dilated in size with greater than 50%  respiratory variability, suggesting right atrial pressure of 8 mmHg.   IAS/Shunts: No atrial level shunt detected by color flow Doppler.    Assessment & Plan   1.  ***   Jossie Ng. Clydine Parkison NP-C     11/25/2022, 10:00 AM [place] [address] Office [phone] Fax [phone]    I spent***minutes examining this patient, reviewing medications, and using patient centered shared decision making involving her cardiac care.  Prior to her visit I spent greater than 20 minutes reviewing her past medical history,  medications, and prior cardiac tests.

## 2022-11-25 NOTE — Evaluation (Addendum)
Physical Therapy Evaluation Patient Details Name: Leslie [name] MRN: [id number] DOB: 16-Mar-1941 Today's Date: 11/25/2022  History of Present Illness  Leslie [name] is a 81 y.o. female who was transferred back to inpatient side (from [facility] being found to be in atrial flutter with heart rates into the 160s and low blood pressures. 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 Abdominal binder and TED hose donned prior to evaluation. Pt able to transition to sitting edge of bed with supervision and cues for initiation. Once sitting edge of bed, pt with increasing confusion and repeatedly trying to lie back down. Reports + nausea and dizziness. HR 95-103 afib rhythm, BP listed below. MD/RN notified. Will continue to progress as tolerated.  Orthostatic Vitals: Supine: 94/78 (85) Sitting: 68/59 (64) Sitting x 5 minutes: 71/58 (63)     Recommendations for follow up therapy are one component of a multi-disciplinary discharge planning process, led by the attending physician.  Recommendations may be updated based on patient status, additional functional criteria and insurance authorization.  Follow Up Recommendations Acute inpatient rehab (3hours/day)      Assistance Recommended at Discharge Frequent or constant Supervision/Assistance  Patient can return home with the following  A little help with walking and/or transfers;A little help with bathing/dressing/bathroom;Assistance with cooking/housework;Assist for transportation;Direct supervision/assist for medications management;Help with stairs or ramp for entrance    Equipment Recommendations None recommended by PT  Recommendations for Other Services       Functional Status Assessment Patient has had a recent decline in their functional status and demonstrates the ability to make significant  improvements in function in a reasonable and predictable amount of time.     Precautions / Restrictions Precautions Precautions: Fall;Other (comment) Precaution Comments: orthostatic Restrictions Weight Bearing Restrictions: No Other Position/Activity Restrictions: TED hose, abdominal binder      Mobility  Bed Mobility Overal bed mobility: Needs Assistance Bed Mobility: Supine to Sit, Sit to Supine     Supine to sit: Supervision Sit to supine: Supervision   General bed mobility comments: No physical assist required    Transfers                   General transfer comment: pt repeatedly trying to lie back down    Ambulation/Gait                  Stairs            Wheelchair Mobility    Modified Rankin (Stroke Patients Only)       Balance Overall balance assessment: Needs assistance Sitting-balance support: Feet supported Sitting balance-Leahy Scale: Fair                                       Pertinent Vitals/Pain Pain Assessment Pain Assessment: No/denies pain    Home Living Family/patient expects to be discharged to:: Private residence Living Arrangements: Alone Available Help at Discharge: Family;Neighbor;Available 24 hours/day Type of Home: House Home Access: Stairs to enter Entrance Stairs-Rails: Right;Left Entrance Stairs-Number of Steps: 2 Alternate Level Stairs-Number of Steps: 4 steps down to main living room w/ rail Home Layout: Two level;Able to live on main level with bedroom/bathroom Home Equipment: Rolling Walker (2 wheels);Cane - single point;Shower seat Additional Comments: Daughter to arrange 24/7 supervision; she is here with her  spouse from [place]    Prior Function Prior Level of Function : Independent/Modified Independent;Driving             Mobility Comments: Mod indep with intermittent use of SPC ADLs Comments: stands in walk-in shower     Hand Dominance   Dominant Hand: Right     Extremity/Trunk Assessment   Upper Extremity Assessment Upper Extremity Assessment: Defer to OT evaluation    Lower Extremity Assessment Lower Extremity Assessment: Overall WFL for tasks assessed    Cervical / Trunk Assessment Cervical / Trunk Assessment: Kyphotic  Communication   Communication: No difficulties  Cognition Arousal/Alertness: Awake/alert Behavior During Therapy: Flat affect Overall Cognitive Status: Impaired/Different from baseline Area of Impairment: Attention, Awareness, Problem solving, Following commands                   Current Attention Level: Selective   Following Commands: Follows multi-step commands inconsistently   Awareness: Emergent Problem Solving: Slow processing, Requires verbal cues General Comments: Increased confusion sitting EOB        General Comments      Exercises     Assessment/Plan    PT Assessment Patient needs continued PT services  PT Problem List Decreased activity tolerance;Decreased balance;Decreased mobility;Cardiopulmonary status limiting activity       PT Treatment Interventions DME instruction;Gait training;Stair training;Functional mobility training;Therapeutic activities;Therapeutic exercise;Patient/family education;Balance training    PT Goals (Current goals can be found in the Care Plan section)  Acute Rehab PT Goals Patient Stated Goal: to feel better PT Goal Formulation: With patient Time For Goal Achievement: 12/09/22 Potential to Achieve Goals: Good    Frequency Min 3X/week     Co-evaluation               AM-PAC PT "6 Clicks" Mobility  Outcome Measure Help needed turning from your back to your side while in a flat bed without using bedrails?: A Little Help needed moving from lying on your back to sitting on the side of a flat bed without using bedrails?: A Little Help needed moving to and from a bed to a chair (including a wheelchair)?: A Little Help needed standing up from a chair  using your arms (e.g., wheelchair or bedside chair)?: A Little Help needed to walk in hospital room?: A Little Help needed climbing 3-5 steps with a railing? : A Lot 6 Click Score: 17    End of Session   Activity Tolerance: Other (comment) (limited by orthostatic hypotensino) Patient left: in bed;with call bell/phone within reach;with bed alarm set Nurse Communication: Mobility status PT Visit Diagnosis: Other abnormalities of gait and mobility (R26.89);Dizziness and giddiness (R42)    Time: 3846-6599 PT Time Calculation (min) (ACUTE ONLY): 33 min   Charges:   PT Evaluation $PT Eval Moderate Complexity: 1 Mod PT Treatments $Therapeutic Activity: 8-22 mins        Wyona Almas, PT, DPT Acute Rehabilitation Services Office [phone]   [name] 11/25/2022, 3:57 PM

## 2022-11-25 NOTE — Progress Notes (Signed)
Rounding Note    Patient Name: Leslie [name] Date of Encounter: 11/25/2022  [facility] Cardiologist: Peter Martinique, MD   Subjective   Patient feels better today. Continues to have mild palpitations. Planned for TEE/DCCV today.  Remains in Afib with RVR with HR 110s BP 90-110s Cr stable 1.25  Inpatient Medications    Scheduled Meds:  apixaban  5 mg Oral BID   vitamin B-12  1,000 mcg Oral Daily   famotidine  20 mg Oral Daily   feeding supplement  237 mL Oral TID BM   Gerhardt's butt cream   Topical BID   [START ON 11/26/2022] levothyroxine  100 mcg Oral Q0600   mouth rinse  15 mL Mouth Rinse 4 times per day   rosuvastatin  10 mg Oral Daily   senna-docusate  2 tablet Oral BID   Continuous Infusions:  sodium chloride     amiodarone 30 mg/hr (11/25/22 1644)   PRN Meds: acetaminophen, alum & mag hydroxide-simeth, bisacodyl **OR** sorbitol, calcium carbonate, camphor-menthol, guaiFENesin-dextromethorphan, ipratropium-albuterol, melatonin, mouth rinse, prochlorperazine **OR** prochlorperazine **OR** prochlorperazine, sodium phosphate   Vital Signs    Vitals:   11/25/22 0953 11/25/22 1000 11/25/22 1045 11/25/22 1500  BP:  92/77 (!) 119/96 103/73  Pulse: (!) 109  (!) 101 61  Resp:  18  20  Temp:    (!) 97.4 F (36.3 C)  TempSrc:    Oral  SpO2:    97%  Weight:        Intake/Output Summary (Last 24 hours) at 11/25/2022 1954 Last data filed at 11/25/2022 0446 Gross per 24 hour  Intake --  Output 250 ml  Net -250 ml       11/25/2022    4:43 AM 11/24/2022    4:03 AM 11/23/2022    5:00 AM  Last 3 Weights  Weight (lbs) 146 lb 2.6 oz 157 lb 10.1 oz 144 lb 10 oz  Weight (kg) 66.3 kg 71.5 kg 65.6 kg      Telemetry    Afib with HR 90-110s- Personally Reviewed  ECG    No new tracing today- Personally Reviewed  Physical Exam   GEN: No acute distress.   Neck: No JVD Cardiac: Irregular, tachycardic. No murmurs  Respiratory: Clear to  auscultation bilaterally. GI: Soft, nontender, non-distended  MS: No edema; No deformity. Neuro:  Nonfocal  Psych: Normal affect   Labs    High Sensitivity Troponin:   Recent Labs  Lab 10/29/22 1552 10/29/22 2018 10/31/22 1540 10/31/22 1736  TROPONINIHS '13 11 12 14      '$ Chemistry Recent Labs  Lab 11/20/22 0527 11/23/22 0842 11/23/22 2043 11/25/22 0130  NA 139 138  --  141  K 3.7 3.8  --  3.8  CL 103 103  --  107  CO2 27 27  --  28  GLUCOSE 116* 134*  --  109*  BUN 18 19  --  12  CREATININE 1.21* 1.36*  --  1.26*  CALCIUM 9.8 9.8  --  9.5  MG  --   --  2.1  --   GFRNONAA 45* 39*  --  43*  ANIONGAP 9 8  --  6     Lipids No results for input(s): "CHOL", "TRIG", "HDL", "LABVLDL", "LDLCALC", "CHOLHDL" in the last 168 hours.  Hematology Recent Labs  Lab 11/19/22 0613 11/25/22 0130  WBC 10.4 9.9  RBC 4.77 4.41  HGB 13.3 12.2  HCT 41.3 38.8  MCV 86.6 88.0  MCH  27.9 27.7  MCHC 32.2 31.4  RDW 15.2 15.6*  PLT 262 219    Thyroid  Recent Labs  Lab 11/25/22 0130  TSH 11.301*  FREET4 1.06     BNPNo results for input(s): "BNP", "PROBNP" in the last 168 hours.  DDimer No results for input(s): "DDIMER" in the last 168 hours.   Radiology    No results found.  Cardiac Studies     Patient Profile     81 y.o. 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 Patient was recently admitted from 10/29/2022 to 11/08/2022 for new onset atrial fibrillation and was found to have a reduced EF of 40-45%. Hospitalization was complicated by community acquired pneumonia (which was initially diagnosed prior to this admission) with right pleural effusion s/p thoracentesis on 10/30/2022, and acute stroke requiring thrombectomy. She was discharged to CIR on 11/08/2022. She went back into rapid  atrial fibrillation with rates in the 150s on 11/23/2022 and was hypotensive with it. Therefore, she was readmitted as an inpatient and Cardiology consulted for assistance   Assessment & Plan     #Persistent Atrial Fibrillation:  Patient with recent admission for new onset atrial fibrillation. Echo showed LVEF of 40-45%. She was started on Diltiazem and apixaban. She was discharged to CIR but represented on this admission for recurrent rapid atrial fibrillation with rates in the 150s with associated hypotension for which Cardiology has been consulted. Given difficulty with rate control and soft blood pressures limiting nodal agents, she is planned for TEE/DCCV today. - Stopped Diltiazem given hypotension and reduced EF - Continue IV amiodarone; will transition to PO post DCCV - Continue Eliquis '5mg'$  twice daily.  She was started on Eliquis on 11/22, missed 1 dose at time of her CVA and thrombectomy on 11/25, otherwise on uninterrupted anticoagulation for past 3 weeks - Plan for TEE/DCCV due to difficulty with rate control and hypotension limiting nodal agents   #Chronic Systolic CHF:  Echo during recent admission showed LVEF of 40-45% with global hypokinesis , moderate asymmetric LVH of the basal-septal segment and moderate to severe MR. Currently euvolemic on exam. Unable to tolerate GDMT due to hypotension. 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 Dilt stopped. Will plan for TEE/DCCV as detailed above. - Improved with IV fluids and stopping diltiazem - Continue TED hose and abdominal binder if needed to help with orthostasis.   #Moderate to Severe MR: Noted on Echo during recent admission. -Will follow-up repeat TTE once NSR  restored to reasses  For questions or updates, please contact [place] Please consult [email or web address] for contact info under        Signed, Freada Bergeron, MD  11/25/2022, 7:54 PM

## 2022-11-26 ENCOUNTER — Inpatient Hospital Stay (HOSPITAL_COMMUNITY): Payer: Medicare HMO | Admitting: Anesthesiology

## 2022-11-26 ENCOUNTER — Inpatient Hospital Stay (HOSPITAL_COMMUNITY): Payer: Medicare HMO

## 2022-11-26 ENCOUNTER — Encounter (HOSPITAL_COMMUNITY)
Admission: EM | Disposition: A | Discharge status: Rehabilitation Facility | Payer: Self-pay | Source: Intra-hospital | Attending: Family Medicine

## 2022-11-26 ENCOUNTER — Encounter (HOSPITAL_COMMUNITY): Payer: Self-pay | Admitting: Internal Medicine

## 2022-11-26 DIAGNOSIS — I340 Nonrheumatic mitral (valve) insufficiency: Secondary | ICD-10-CM

## 2022-11-26 DIAGNOSIS — I4891 Unspecified atrial fibrillation: Secondary | ICD-10-CM

## 2022-11-26 DIAGNOSIS — Z87891 Personal history of nicotine dependence: Secondary | ICD-10-CM

## 2022-11-26 DIAGNOSIS — I5020 Unspecified systolic (congestive) heart failure: Secondary | ICD-10-CM | POA: Diagnosis not present

## 2022-11-26 DIAGNOSIS — I6930 Unspecified sequelae of cerebral infarction: Secondary | ICD-10-CM | POA: Diagnosis not present

## 2022-11-26 DIAGNOSIS — J449 Chronic obstructive pulmonary disease, unspecified: Secondary | ICD-10-CM

## 2022-11-26 DIAGNOSIS — I509 Heart failure, unspecified: Secondary | ICD-10-CM | POA: Diagnosis not present

## 2022-11-26 HISTORY — PX: BUBBLE STUDY: SHX6837

## 2022-11-26 HISTORY — PX: CARDIOVERSION: SHX1299

## 2022-11-26 HISTORY — PX: TEE WITHOUT CARDIOVERSION: SHX5443

## 2022-11-26 LAB — BASIC METABOLIC PANEL
Anion gap: 8 (ref 5–15)
BUN: 10 mg/dL (ref 8–23)
CO2: 26 mmol/L (ref 22–32)
Calcium: 9.5 mg/dL (ref 8.9–10.3)
Chloride: 105 mmol/L (ref 98–111)
Creatinine, Ser: 1.25 mg/dL — ABNORMAL HIGH (ref 0.44–1.00)
GFR, Estimated: 44 mL/min — ABNORMAL LOW (ref >60)
Glucose, Bld: 105 mg/dL — ABNORMAL HIGH (ref 70–99)
Potassium: 3.5 mmol/L (ref 3.5–5.1)
Sodium: 139 mmol/L (ref 135–145)

## 2022-11-26 LAB — ECHO TEE
MV M vel: 3.2 m/s
MV Peak grad: 41 mmHg
Radius: 0.7 cm

## 2022-11-26 LAB — CBC
HCT: 39.1 % (ref 36.0–46.0)
Hemoglobin: 12.8 g/dL (ref 12.0–15.0)
MCH: 28.4 pg (ref 26.0–34.0)
MCHC: 32.7 g/dL (ref 30.0–36.0)
MCV: 86.7 fL (ref 80.0–100.0)
Platelets: 208 10*3/uL (ref 150–400)
RBC: 4.51 MIL/uL (ref 3.87–5.11)
RDW: 15.7 % — ABNORMAL HIGH (ref 11.5–15.5)
WBC: 10 10*3/uL (ref 4.0–10.5)
nRBC: 0 % (ref 0.0–0.2)

## 2022-11-26 SURGERY — ECHOCARDIOGRAM, TRANSESOPHAGEAL
Anesthesia: General

## 2022-11-26 MED ORDER — SODIUM CHLORIDE 0.9 % IV SOLN: INTRAVENOUS | Status: DC | PRN

## 2022-11-26 MED ORDER — BUTAMBEN-TETRACAINE-BENZOCAINE 2-2-14 % EX AERO
INHALATION_SPRAY | CUTANEOUS | Status: DC | PRN
  Administered 2022-11-26: 1 via TOPICAL

## 2022-11-26 MED ORDER — PROPOFOL 500 MG/50ML IV EMUL
INTRAVENOUS | Status: DC | PRN
  Administered 2022-11-26: 50 ug/kg/min via INTRAVENOUS

## 2022-11-26 MED ORDER — LIDOCAINE 2% (20 MG/ML) 5 ML SYRINGE
INTRAMUSCULAR | Status: DC | PRN
  Administered 2022-11-26: 60 mg via INTRAVENOUS

## 2022-11-26 MED ORDER — PROPOFOL 10 MG/ML IV BOLUS
INTRAVENOUS | Status: DC | PRN
  Administered 2022-11-26: 40 mg via INTRAVENOUS

## 2022-11-26 MED ORDER — PHENYLEPHRINE 80 MCG/ML (10ML) SYRINGE FOR IV PUSH (FOR BLOOD PRESSURE SUPPORT)
PREFILLED_SYRINGE | INTRAVENOUS | Status: DC | PRN
  Administered 2022-11-26: 80 ug via INTRAVENOUS
  Administered 2022-11-26: 160 ug via INTRAVENOUS
  Administered 2022-11-26: 80 ug via INTRAVENOUS

## 2022-11-26 MED ORDER — MIDODRINE HCL 5 MG PO TABS
2.5000 mg | ORAL_TABLET | Freq: Three times a day (TID) | ORAL | Status: DC
  Administered 2022-11-26 – 2022-11-29 (×8): 2.5 mg via ORAL
  Filled 2022-11-26 (×8): qty 1

## 2022-11-26 NOTE — Progress Notes (Signed)
  Echocardiogram Echocardiogram Transesophageal has been performed.  Leslie [name] M 11/26/2022, 12:34 PM

## 2022-11-26 NOTE — Progress Notes (Signed)
Rounding Note    Patient Name: Leslie [name] Date of Encounter: 11/26/2022   [facility] Cardiologist: Peter Martinique, MD   Subjective   TEE with LVEF 20%, severe MR likely functional, mild AR  Inpatient Medications    Scheduled Meds:  apixaban  5 mg Oral BID   vitamin B-12  1,000 mcg Oral Daily   famotidine  20 mg Oral Daily   feeding supplement  237 mL Oral TID BM   Gerhardt's butt cream   Topical BID   levothyroxine  100 mcg Oral Q0600   midodrine  2.5 mg Oral TID WC   mouth rinse  15 mL Mouth Rinse 4 times per day   rosuvastatin  10 mg Oral Daily   senna-docusate  2 tablet Oral BID   Continuous Infusions:  amiodarone 30 mg/hr (11/26/22 1628)   PRN Meds: acetaminophen, alum & mag hydroxide-simeth, bisacodyl **OR** sorbitol, calcium carbonate, camphor-menthol, guaiFENesin-dextromethorphan, ipratropium-albuterol, melatonin, mouth rinse, prochlorperazine **OR** prochlorperazine **OR** prochlorperazine, sodium phosphate   Vital Signs    Vitals:   11/26/22 1241 11/26/22 1250 11/26/22 1310 11/26/22 1556  BP: 105/78 111/76 121/74 104/67  Pulse: 77 78 77   Resp: '15 15 18 16  '$ Temp:    98.1 F (36.7 C)  TempSrc:   Oral Oral  SpO2: 96% 95% 96% 96%  Weight:        Intake/Output Summary (Last 24 hours) at 11/26/2022 1815 Last data filed at 11/26/2022 1203 Gross per 24 hour  Intake 300 ml  Output 300 ml  Net 0 ml       11/26/2022    3:38 AM 11/25/2022    4:43 AM 11/24/2022    4:03 AM  Last 3 Weights  Weight (lbs) 141 lb 9.6 oz 146 lb 2.6 oz 157 lb 10.1 oz  Weight (kg) 64.229 kg 66.3 kg 71.5 kg      Telemetry    ***- Personally Reviewed  ECG    No new tracing today- Personally Reviewed  Physical Exam   GEN: No acute distress.   Neck: No JVD Cardiac: Irregular, tachycardic. No murmurs  Respiratory: Clear to auscultation bilaterally. GI: Soft, nontender, non-distended  MS: No edema; No deformity. Neuro:  Nonfocal  Psych: Normal  affect   Labs    High Sensitivity Troponin:   Recent Labs  Lab 10/29/22 1552 10/29/22 2018 10/31/22 1540 10/31/22 1736  TROPONINIHS '13 11 12 14      '$ Chemistry Recent Labs  Lab 11/23/22 0842 11/23/22 2043 11/25/22 0130 11/26/22 0608  NA 138  --  141 139  K 3.8  --  3.8 3.5  CL 103  --  107 105  CO2 27  --  28 26  GLUCOSE 134*  --  109* 105*  BUN 19  --  12 10  CREATININE 1.36*  --  1.26* 1.25*  CALCIUM 9.8  --  9.5 9.5  MG  --  2.1  --   --   GFRNONAA 39*  --  43* 44*  ANIONGAP 8  --  6 8     Lipids No results for input(s): "CHOL", "TRIG", "HDL", "LABVLDL", "LDLCALC", "CHOLHDL" in the last 168 hours.  Hematology Recent Labs  Lab 11/25/22 0130 11/26/22 0608  WBC 9.9 10.0  RBC 4.41 4.51  HGB 12.2 12.8  HCT 38.8 39.1  MCV 88.0 86.7  MCH 27.7 28.4  MCHC 31.4 32.7  RDW 15.6* 15.7*  PLT 219 208    Thyroid  Recent Labs  Lab  11/25/22 0130  TSH 11.301*  FREET4 1.06     BNPNo results for input(s): "BNP", "PROBNP" in the last 168 hours.  DDimer No results for input(s): "DDIMER" in the last 168 hours.   Radiology    ECHO TEE  Result Date: 11/26/2022    TRANSESOPHOGEAL ECHO REPORT   Patient Name:   Leslie [name] Date of Exam: 11/26/2022 Medical Rec #:  [id number]           Height:       67.5 in Accession #:    [id number]          Weight:       141.6 lb Date of Birth:  Jul 12, 1941           BSA:          1.756 m Patient Age:    50 years            BP:           148/122 mmHg Patient Gender: F                   HR:           148 bpm. Exam Location:  Inpatient Procedure: Transesophageal Echo, Color Doppler and Cardiac Doppler Indications:     Afib  History:         Patient has prior history of Echocardiogram examinations, most                  recent 10/30/2022. Stroke. Chronic kidney disease.  Sonographer:     Darlina Sicilian RDCS Referring Phys:  [id number] Margie Billet Diagnosing Phys: Cherlynn Kaiser MD PROCEDURE: After discussion of the risks and benefits  of a TEE, an informed consent was obtained from the patient. TEE procedure time was 11 minutes. The transesophogeal probe was passed without difficulty through the esophogus of the patient. Imaged were obtained with the patient in a left lateral decubitus position. Local oropharyngeal anesthetic was provided with Cetacaine. Sedation performed by different physician. The patient was monitored while under deep sedation. Anesthestetic sedation was provided intravenously by Anesthesiology: 72.'74mg'$  of Propofol, '60mg'$  of Lidocaine. Image quality was good. The patient's vital signs; including heart rate, blood pressure, and oxygen saturation; remained stable throughout the procedure. The patient developed no complications during the procedure. A successful direct current cardioversion was performed at 120 joules with 1 attempt.  IMPRESSIONS  1. Left ventricular ejection fraction, by estimation, is 20%. The left ventricle has severely decreased function.  2. Right ventricular systolic function is severely reduced. The right ventricular size is mildly enlarged.  3. Left atrial size was moderately dilated. No left atrial/left atrial appendage thrombus was detected. The LAA emptying velocity was 35 cm/s.  4. A small pericardial effusion is present.  5. The mitral valve is grossly normal. Severe mitral valve regurgitation, likely secondary to LV dysfunction and atrial functional MR. Multiple jets, appears commissural.  6. The aortic valve is grossly normal. Aortic valve regurgitation is mild.  7. There is mild (Grade II) atheroma plaque involving the aortic arch and descending aorta. Conclusion(s)/Recommendation(s): No LA/LAA thrombus identified. Successful cardioversion performed with restoration of normal sinus rhythm. FINDINGS  Left Ventricle: Left ventricular ejection fraction, by estimation, is 20%. The left ventricle has severely decreased function. The left ventricular internal cavity size was normal in size. Right  Ventricle: The right ventricular size is mildly enlarged. Right vetricular wall thickness was not well visualized. Right ventricular systolic function is severely reduced.  Left Atrium: Left atrial size was moderately dilated. No left atrial/left atrial appendage thrombus was detected. The LAA emptying velocity was 35 cm/s. Right Atrium: Right atrial size was normal in size. Pericardium: A small pericardial effusion is present. Mitral Valve: The mitral valve is grossly normal. There is mild thickening of the mitral valve leaflet(s). Severe mitral valve regurgitation. Tricuspid Valve: The tricuspid valve is normal in structure. Tricuspid valve regurgitation is mild. Aortic Valve: The aortic valve is grossly normal. Aortic valve regurgitation is mild. Pulmonic Valve: The pulmonic valve was normal in structure. Pulmonic valve regurgitation is trivial. Aorta: The aortic root and ascending aorta are structurally normal, with no evidence of dilitation. There is mild (Grade II) atheroma plaque involving the aortic arch and descending aorta. Venous: A systolic blunting flow pattern is recorded from the right upper pulmonary vein and the left upper pulmonary vein. IAS/Shunts: No atrial level shunt detected by color flow Doppler. Additional Comments: Spectral Doppler performed.  AORTA Ao Root diam: 3.40 cm Ao Asc diam:  3.10 cm MR Peak grad:    41.0 mmHg MR Mean grad:    29.0 mmHg MR Vmax:         320.00 cm/s MR Vmean:        255.0 cm/s MR PISA:         3.08 cm MR PISA Eff ROA: 37 mm MR PISA Radius:  0.70 cm Cherlynn Kaiser MD Electronically signed by Cherlynn Kaiser MD Signature Date/Time: 11/26/2022/3:19:51 PM    Final     Cardiac Studies  TEE 11/26/22: IMPRESSIONS     1. Left ventricular ejection fraction, by estimation, is 20%. The left  ventricle has severely decreased function.   2. Right ventricular systolic function is severely reduced. The right  ventricular size is mildly enlarged.   3. Left atrial size  was moderately dilated. No left atrial/left atrial  appendage thrombus was detected. The LAA emptying velocity was 35 cm/s.   4. A small pericardial effusion is present.   5. The mitral valve is grossly normal. Severe mitral valve regurgitation,  likely secondary to LV dysfunction and atrial functional MR. Multiple  jets, appears commissural.   6. The aortic valve is grossly normal. Aortic valve regurgitation is  mild.   7. There is mild (Grade II) atheroma plaque involving the aortic arch and  descending aorta.   Conclusion(s)/Recommendation(s): No LA/LAA thrombus identified. Successful  cardioversion performed with restoration of normal sinus rhythm.    Patient Profile     81 y.o. 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 Patient was recently admitted from 10/29/2022 to 11/08/2022 for new onset atrial fibrillation and was found to have a reduced EF of 40-45%. Hospitalization was complicated by community acquired pneumonia (which was initially diagnosed prior to this admission) with right pleural effusion s/p thoracentesis on 10/30/2022, and acute stroke requiring thrombectomy. She was discharged to [facility] on 11/08/2022. She went back into rapid atrial fibrillation with rates in the 150s on 11/23/2022 and was hypotensive with it. Therefore, she was readmitted as an inpatient and Cardiology consulted for assistance   Assessment & Plan     #Persistent Atrial Fibrillation:  Patient with recent admission for new onset atrial fibrillation. Echo showed LVEF of 40-45%. She was started on Diltiazem and apixaban. She was discharged to CIR but represented on this admission for recurrent  rapid atrial fibrillation with rates in the 150s with associated hypotension for which Cardiology has been consulted. Given  difficulty with rate control and soft blood pressures limiting nodal agents, she is planned for TEE/DCCV today. - Stopped Diltiazem given hypotension and reduced EF - Continue IV amiodarone; will transition to PO post DCCV - Continue Eliquis '5mg'$  twice daily.  She was started on Eliquis on 11/22, missed 1 dose at time of her CVA and thrombectomy on 11/25, otherwise on uninterrupted anticoagulation for past 3 weeks - Plan for TEE/DCCV due to difficulty with rate control and hypotension limiting nodal agents   #Chronic Systolic CHF:  Echo during recent admission showed LVEF of 40-45% with global hypokinesis , moderate asymmetric LVH of the basal-septal segment and moderate to severe MR. Currently euvolemic on exam. Unable to tolerate GDMT due to hypotension. 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 Dilt stopped. Will plan for TEE/DCCV as detailed above. - Improved with IV fluids and stopping diltiazem - Continue TED hose and abdominal binder if needed to help with orthostasis.   #Moderate to Severe MR: Noted on Echo during recent admission. -Will follow-up repeat TTE once NSR restored to reasses  For questions or updates, please contact [place] Please consult [email or web address] for contact info under        Signed, Freada Bergeron, MD  11/26/2022, 6:15 PM

## 2022-11-26 NOTE — Progress Notes (Signed)
   11/26/22 1445  Level of Consciousness  Level of Consciousness Alert  MEWS COLOR  MEWS Score Color Green  Orthostatic Lying   BP- Lying 100/44  Orthostatic Sitting  BP- Sitting (!) 83/49  MEWS Score  MEWS Temp 0  MEWS Systolic 0  MEWS Pulse 0  MEWS RR 0  MEWS LOC 0  MEWS Score 0   OT did orthostatics on pt at 1445 and was unable to get any standing VS d/t the pt being dizzy.

## 2022-11-26 NOTE — Evaluation (Addendum)
Occupational Therapy Evaluation Patient Details Name: Leslie [name] MRN: [id number] DOB: Jan 09, 1941 Today's Date: 11/26/2022   History of Present Illness Leslie [name] is a 81 y.o. female who was transferred back to inpatient side (from [facility] being found to be in atrial flutter with heart rates into the 160s and low blood pressures. S/p TEE/cardioversion on 12/18. 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 Pt currently limited by orthostatic hypotension/dizziness despite application of abdominal binder and ted hose prior to session. Pt with L > R UE weakness and decreased coordination, however functional for BADL. Pt needing min-mod A for ADLs, supervision for bed mobility and able to laterally scoot at EOB, pt declining/deferring standing transfer. Pt presenting with impairments listed below, will follow acutely. Recommend AIR at d/c.  BP supine: 100/44 (61) BP seated: 83/49 (60)     Recommendations for follow up therapy are one component of a multi-disciplinary discharge planning process, led by the attending physician.  Recommendations may be updated based on patient status, additional functional criteria and insurance authorization.   Follow Up Recommendations  Acute inpatient rehab (3hours/day)     Assistance Recommended at Discharge Frequent or constant Supervision/Assistance  Patient can return home with the following A little help with walking and/or transfers;A little help with bathing/dressing/bathroom;Assistance with cooking/housework;Assistance with feeding;Direct supervision/assist for medications management;Direct supervision/assist for financial management;Assist for transportation;Help with stairs or ramp for entrance    Functional Status  Assessment  Patient has had a recent decline in their functional status and demonstrates the ability to make significant improvements in function in a reasonable and predictable amount of time.  Equipment Recommendations  None recommended by OT (defer)    Recommendations for Other Services Rehab consult     Precautions / Restrictions Precautions Precautions: Fall;Other (comment) Precaution Comments: orthostatic Restrictions Weight Bearing Restrictions: No Other Position/Activity Restrictions: TED hose, abdominal binder      Mobility Bed Mobility Overal bed mobility: Needs Assistance Bed Mobility: Supine to Sit, Sit to Supine     Supine to sit: Supervision Sit to supine: Supervision        Transfers Overall transfer level: Needs assistance                 General transfer comment: lateral scoot transfer simulated at EOB, pt declining standing attempts due to dizziness      Balance Overall balance assessment: Needs assistance Sitting-balance support: Feet supported Sitting balance-Leahy Scale: Fair         Standing balance comment: pt declining                           ADL either performed or assessed with clinical judgement   ADL Overall ADL's : Needs assistance/impaired Eating/Feeding: Set up Eating/Feeding Details (indicate cue type and reason): eating with HOB 30* upon arrival Grooming: Sitting;Minimal assistance   Upper Body Bathing: Minimal assistance;Sitting;Bed level   Lower Body Bathing: Moderate assistance;Sit to/from stand   Upper Body Dressing : Minimal assistance;Sitting   Lower Body Dressing: Sitting/lateral leans;Maximal assistance Lower Body Dressing Details (indicate cue type and reason): donning socks and ted hose Toilet Transfer: Minimal assistance;Ambulation;BSC/3in1   Toileting- Water quality scientist and Hygiene: Moderate assistance;Bed level Toileting - Clothing Manipulation Details (indicate cue type and reason):  bridging in supine  to assist with doffing mesh underwear     Functional mobility during ADLs: Minimal assistance       Vision   Additional Comments: L inattention, will further assess     Perception Perception Perception Tested?: No   Praxis Praxis Praxis tested?: Not tested    Pertinent Vitals/Pain Pain Assessment Pain Assessment: No/denies pain     Hand Dominance Right   Extremity/Trunk Assessment Upper Extremity Assessment Upper Extremity Assessment: LUE deficits/detail LUE Deficits / Details: 3+/5, ROM and strength, able to use functionally during session to assist self to EOB, also grasping at cords/lines with LUE LUE Sensation: decreased light touch;decreased proprioception LUE Coordination: decreased fine motor;decreased gross motor   Lower Extremity Assessment Lower Extremity Assessment: Defer to PT evaluation   Cervical / Trunk Assessment Cervical / Trunk Assessment: Kyphotic   Communication Communication Communication: No difficulties   Cognition Arousal/Alertness: Awake/alert Behavior During Therapy: Flat affect Overall Cognitive Status: Impaired/Different from baseline Area of Impairment: Attention, Awareness, Problem solving, Following commands                   Current Attention Level: Selective   Following Commands: Follows one step commands with increased time   Awareness: Emergent Problem Solving: Slow processing, Requires verbal cues General Comments: oriented to self, stating it is November, decreased awareness of current condition. Bed soaked with urine upon arrival, pt unaware     General Comments  see note for orthostatic BP    Exercises     Shoulder Instructions      Home Living Family/patient expects to be discharged to:: Private residence Living Arrangements: Alone Available Help at Discharge: Family;Neighbor;Available 24 hours/day Type of Home: House Home Access: Stairs to enter [employer] of Steps:  2 Entrance Stairs-Rails: Right;Left Home Layout: Two level;Able to live on main level with bedroom/bathroom Alternate Level Stairs-Number of Steps: 4 steps down to main living room w/ rail   Bathroom Shower/Tub: Occupational psychologist: Standard Bathroom Accessibility: Yes How Accessible: Accessible via walker Home Equipment: [place] (2 wheels);Cane - single point;Shower seat   Additional Comments: Daughter to arrange 24/7 supervision; she is here with her spouse from [place] With: Alone    Prior Functioning/Environment Prior Level of Function : Independent/Modified Independent;Driving             Mobility Comments: Mod indep with intermittent use of SPC ADLs Comments: stands in walk-in shower        OT Problem List: Decreased strength;Decreased range of motion;Decreased activity tolerance;Impaired balance (sitting and/or standing);Decreased safety awareness;Decreased knowledge of use of DME or AE;Decreased knowledge of precautions;Decreased coordination;Impaired UE functional use;Impaired sensation      OT Treatment/Interventions: Self-care/ADL training;Therapeutic exercise;Neuromuscular education;DME and/or AE instruction;Therapeutic activities;Patient/family education;Balance training    OT Goals(Current goals can be found in the care plan section) Acute Rehab OT Goals Patient Stated Goal: none stated OT Goal Formulation: With patient Time For Goal Achievement: 12/10/22 Potential to Achieve Goals: Good ADL Goals Pt Will Perform Upper Body Dressing: with min assist;sitting Pt Will Perform Lower Body Dressing: with min assist;sitting/lateral leans;sit to/from stand Pt Will Transfer to Toilet: with min assist;ambulating;regular height toilet  OT Frequency: Min 2X/week    Co-evaluation              AM-PAC OT "6 Clicks" Daily Activity     Outcome Measure Help from another person eating meals?: A Little Help from another person taking care of  personal grooming?: A Little Help from another  person toileting, which includes using toliet, bedpan, or urinal?: A Lot Help from another person bathing (including washing, rinsing, drying)?: A Lot Help from another person to put on and taking off regular upper body clothing?: A Little Help from another person to put on and taking off regular lower body clothing?: A Lot 6 Click Score: 15   End of Session Equipment Utilized During Treatment: Gait belt Nurse Communication: Mobility status  Activity Tolerance: Patient tolerated treatment well Patient left: in bed;with call bell/phone within reach;with bed alarm set;with nursing/sitter in room  OT Visit Diagnosis: Unsteadiness on feet (R26.81);Other abnormalities of gait and mobility (R26.89);Muscle weakness (generalized) (M62.81);Hemiplegia and hemiparesis Hemiplegia - Right/Left: Left Hemiplegia - dominant/non-dominant: Non-Dominant Hemiplegia - caused by: Cerebral infarction                Time: 1430-1500 OT Time Calculation (min): 30 min Charges:  OT General Charges $OT Visit: 1 Visit OT Evaluation $OT Eval Moderate Complexity: 1 Mod OT Treatments $Self Care/Home Management : 8-22 mins  Renaye Rakers, OTD, OTR/L SecureChat Preferred Acute Rehab [phone]   Renaye Rakers [place] 11/26/2022, 4:08 PM

## 2022-11-26 NOTE — Progress Notes (Signed)
PROGRESS NOTE    Leslie [name]  HCW:[id number] DOB: 06-Jan-1941 DOA: 11/23/2022 PCP: Leslie Side., FNP  No chief complaint on file.   Brief Narrative:  Leslie [name] is Leslie [name] 81 y.o. female with medical history significant of CVA, atrial flutter fibrillation/flutter, hypothyroidism, GERD who is being transferred back to inpatient side after being found to be in atrial flutter with heart rates into the 160s and low blood pressures.   Just recently been hospitalized from 11/20-11/30, after being found to be in atrial fibrillation with RVR after being seen by primary care provider for lightheadedness.  She was started on Cardizem drip and anticoagulation.  CT of the chest had noted right layering pleural effusion and right upper lobe pneumonia.  Patient was treated with Rocephin and azithromycin which was completed on 11/27.  Thoracentesis was performed which noted per monitoring fusion.  Echocardiogram noted EF to be 40- 45% with LV global hypokinesis.  Patient had been tried on metoprolol but reported shortness of breath for which it was discontinued.  Patient was placed on Cardizem and transition to Eliquis with plans for outpatient cardioversion.  However patient found to have left-sided hemiparesis and left-sided neglect on 11/25.  Imaging significant for right M1 occlusion, and patient underwent cerebral angiogram with thrombectomy of the right MCA occluded segment with complete revascularization.  MRI revealed Leslie [name] small acute infarct of the right frontal, temporal lobe, and caudate body with chronic small vessel disease.  Neurology had thought symptoms were secondary to atrial fibrillation.  Seems that during her hospitalization amiodarone had been utilized to convert the previously during her hospital stay.  Cardizem has been titrated back up and patient was discharged to inpatient rehab.  Since admitted into inpatient rehab patient had been having issues hypotension for which Cardizem CD  was reduced from 360 mg to 300 mg.  They had been wrapping the patient's legs as well as placing an abdominal binder without significant improvement.  Cardizem dosing had been changed to 75 mg 4 times daily, then 60 mg 4 times daily, and currently she was on 30 mg 4 times daily.  Despite these changes patient was still noted to be orthostatic.  Patient was noted to have heart rates to the 160s today in atrial flutter with blood pressure as low as 84/50.  Patient was bolused IV fluids with improvement in blood pressure.  Labs today noted potassium 3.8, BUN 19, and creatinine 1.36.  Cardiology was consulted and recommended transferring back to Leslie [name] monitored bed.  Orders placed for progressive bed.  Patient reports that she does not feel well but has not felt well for quite some time.  Denies any fever, chest pain, or shortness of breath.  Currently states that she would like some chicken noodle soup  Assessment & Plan:   Active Problems:   Atrial fibrillation with rapid ventricular response ([name])   Hypotension   Heart failure with reduced ejection fraction ([name])   History of CVA with residual deficit   COPD (chronic obstructive pulmonary disease) ([name])   Hypothyroidism  Atrial fibrillation with RVR Patient has been dealing with low blood pressures for which she had been transition from Cardizem CD 360 mg down to Cardizem 30 mg every 6 hours.  In doing so patient's heart rates became uncontrolled and she was noted to still be hypotensive.  Appears during previous hospitalization amiodarone had been noted to have converted patient back into Leslie [name] sinus rhythm.  She had not tolerated metoprolol due to shortness  of breath. -Discontinued Cardizem -appreciate cardiology assistance -> currently on amiodarone.  Continue eliquis.  S/p TEE/DCCV today. -Goal potassium at least 4 and magnesium at least 2 -Continue Eliquis -Appreciate cardiology consultative services, will follow-up for any further recommendations ->  s/p TEE cardioversion 12/18.  Currently in sinus rhythm.     Delirium  - delirium precautions - improved today - w/u additionally if persistent  Orthostatic Hypotension  Hypotension - hypotension from presentation improved - Continue TED hose and abdominal binder - continued orthostasis 12/17 with therapy despite ted hose and abdominal binder.  Stop beta blocker.  If persistent with antihypertensives d/c'd, consider midodrine. - follow repeat orthostatics -Goal MAP at least 65   Heart failure with reduced EF On physical exam patient's lung sounds clear and no significant lower extremity edema appreciated.  Echocardiogram had noted EF to be 40-45 % with global hypokinesis.  Patient was also noted to have moderate to severe mitral regurgitation. -Check I&O's and daily weights - needs repeat echo in 2-3 months - consider ischemic evaluation based on repeat echo (concern cardiomyopathy related to tachyarrhythmia, repeat echo 2-3 months after restoration of sinus rhythm)   History of CVA with residual deficits Patient had Leslie [name] right middle cerebral artery embolism on 11/25 and underwent thrombectomy. MRI revealed Leslie [name] small acute infarct of the right frontal, temporal lobe, and caudate body with chronic small vessel disease.  -PT/OT/speech to eval and treat   COPD Patient without acute exacerbation.   Hypothyroidism -Check TSH (elevated) -Continue levothyroxine (increase dose)    DVT prophylaxis: eliquis Code Status: full Family Communication: none Disposition:   Status is: Inpatient Remains inpatient appropriate because: persistent RVR   Consultants:  cardiology  Procedures:  none  Antimicrobials:  Anti-infectives (From admission, onward)    None       Subjective: No new complaints Feels better after cardioversion  Objective: Vitals:   11/26/22 1241 11/26/22 1250 11/26/22 1310 11/26/22 1556  BP: 105/78 111/76 121/74 104/67  Pulse: 77 78 77   Resp: '15 15 18 16   '$ Temp:    98.1 F (36.7 C)  TempSrc:   Oral Oral  SpO2: 96% 95% 96% 96%  Weight:        Intake/Output Summary (Last 24 hours) at 11/26/2022 1557 Last data filed at 11/26/2022 1203 Gross per 24 hour  Intake 300 ml  Output 300 ml  Net 0 ml   Filed Weights   11/24/22 0403 11/25/22 0443 11/26/22 0338  Weight: 71.5 kg 66.3 kg 64.2 kg    Examination:  General: No acute distress. Cardiovascular: RRR Lungs: unlabored Abdomen: Soft, nontender, nondistended  Neurological: Alert and oriented 3. Moves all extremities 4. Cranial nerves II through XII grossly intact. Extremities: No clubbing or cyanosis. No edema  Data Reviewed: I have personally reviewed following labs and imaging studies  CBC: Recent Labs  Lab 11/25/22 0130 11/26/22 0608  WBC 9.9 10.0  HGB 12.2 12.8  HCT 38.8 39.1  MCV 88.0 86.7  PLT 219 347    Basic Metabolic Panel: Recent Labs  Lab 11/20/22 0527 11/23/22 0842 11/23/22 2043 11/25/22 0130 11/26/22 0608  NA 139 138  --  141 139  K 3.7 3.8  --  3.8 3.5  CL 103 103  --  107 105  CO2 27 27  --  28 26  GLUCOSE 116* 134*  --  109* 105*  BUN 18 19  --  12 10  CREATININE 1.21* 1.36*  --  1.26* 1.25*  CALCIUM 9.8  9.8  --  9.5 9.5  MG  --   --  2.1  --   --     GFR: Estimated Creatinine Clearance: 35.6 mL/min (Myrikal Messmer) (by C-G formula based on SCr of 1.25 mg/dL (H)).  Liver Function Tests: No results for input(s): "AST", "ALT", "ALKPHOS", "BILITOT", "PROT", "ALBUMIN" in the last 168 hours.  CBG: No results for input(s): "GLUCAP" in the last 168 hours.   No results found for this or any previous visit (from the past 240 hour(s)).       Radiology Studies: ECHO TEE  Result Date: 11/26/2022    TRANSESOPHOGEAL ECHO REPORT   Patient Name:   [name] Date of Exam: 11/26/2022 Medical Rec #:  [id number]           Height:       67.5 in Accession #:    [id number]          Weight:       141.6 lb Date of Birth:  [birth year]           BSA:           1.756 m Patient Age:    41 years            BP:           148/122 mmHg Patient Gender: F                   HR:           148 bpm. Exam Location:  Inpatient Procedure: Transesophageal Echo, Color Doppler and Cardiac Doppler Indications:     Afib  History:         Patient has prior history of Echocardiogram examinations, most                  recent 10/30/2022. Stroke. Chronic kidney disease.  Sonographer:     Darlina Sicilian RDCS Referring Phys:  [id number] Margie Billet Diagnosing Phys: Cherlynn Kaiser MD PROCEDURE: After discussion of the risks and benefits of Colleen Kotlarz TEE, an informed consent was obtained from the patient. TEE procedure time was 11 minutes. The transesophogeal probe was passed without difficulty through the esophogus of the patient. Imaged were obtained with the patient in Drey Shaff left lateral decubitus position. Local oropharyngeal anesthetic was provided with Cetacaine. Sedation performed by different physician. The patient was monitored while under deep sedation. Anesthestetic sedation was provided intravenously by Anesthesiology: 72.'74mg'$  of Propofol, '60mg'$  of Lidocaine. Image quality was good. The patient's vital signs; including heart rate, blood pressure, and oxygen saturation; remained stable throughout the procedure. The patient developed no complications during the procedure. Darron Stuck successful direct current cardioversion was performed at 120 joules with 1 attempt.  IMPRESSIONS  1. Left ventricular ejection fraction, by estimation, is 20%. The left ventricle has severely decreased function.  2. Right ventricular systolic function is severely reduced. The right ventricular size is mildly enlarged.  3. Left atrial size was moderately dilated. No left atrial/left atrial appendage thrombus was detected. The LAA emptying velocity was 35 cm/s.  4. Decklan Mau small pericardial effusion is present.  5. The mitral valve is grossly normal. Severe mitral valve regurgitation, likely secondary to LV dysfunction and atrial  functional MR. Multiple jets, appears commissural.  6. The aortic valve is grossly normal. Aortic valve regurgitation is mild.  7. There is mild (Grade II) atheroma plaque involving the aortic arch and descending aorta. Conclusion(s)/Recommendation(s): No LA/LAA thrombus identified. Successful cardioversion performed with restoration of normal  sinus rhythm. FINDINGS  Left Ventricle: Left ventricular ejection fraction, by estimation, is 20%. The left ventricle has severely decreased function. The left ventricular internal cavity size was normal in size. Right Ventricle: The right ventricular size is mildly enlarged. Right vetricular wall thickness was not well visualized. Right ventricular systolic function is severely reduced. Left Atrium: Left atrial size was moderately dilated. No left atrial/left atrial appendage thrombus was detected. The LAA emptying velocity was 35 cm/s. Right Atrium: Right atrial size was normal in size. Pericardium: Breianna Delfino small pericardial effusion is present. Mitral Valve: The mitral valve is grossly normal. There is mild thickening of the mitral valve leaflet(s). Severe mitral valve regurgitation. Tricuspid Valve: The tricuspid valve is normal in structure. Tricuspid valve regurgitation is mild. Aortic Valve: The aortic valve is grossly normal. Aortic valve regurgitation is mild. Pulmonic Valve: The pulmonic valve was normal in structure. Pulmonic valve regurgitation is trivial. Aorta: The aortic root and ascending aorta are structurally normal, with no evidence of dilitation. There is mild (Grade II) atheroma plaque involving the aortic arch and descending aorta. Venous: Elizbeth Posa systolic blunting flow pattern is recorded from the right upper pulmonary vein and the left upper pulmonary vein. IAS/Shunts: No atrial level shunt detected by color flow Doppler. Additional Comments: Spectral Doppler performed.  AORTA Ao Root diam: 3.40 cm Ao Asc diam:  3.10 cm MR Peak grad:    41.0 mmHg MR Mean grad:     29.0 mmHg MR Vmax:         320.00 cm/s MR Vmean:        255.0 cm/s MR PISA:         3.08 cm MR PISA Eff ROA: 37 mm MR PISA Radius:  0.70 cm Cherlynn Kaiser MD Electronically signed by Cherlynn Kaiser MD Signature Date/Time: 11/26/2022/3:19:51 PM    Final         Scheduled Meds:  apixaban  5 mg Oral BID   vitamin B-12  1,000 mcg Oral Daily   famotidine  20 mg Oral Daily   feeding supplement  237 mL Oral TID BM   Gerhardt's butt cream   Topical BID   levothyroxine  100 mcg Oral Q0600   mouth rinse  15 mL Mouth Rinse 4 times per day   rosuvastatin  10 mg Oral Daily   senna-docusate  2 tablet Oral BID   Continuous Infusions:  amiodarone 30 mg/hr (11/26/22 1149)     LOS: 3 days    Time spent: over 30 min    Fayrene Helper, MD [facility]   To contact the attending provider between 7A-7P or the covering provider during after hours 7P-7A, please log into the web site [email or web address] and access using universal [place] password for that web site. If you do not have the password, please call the hospital operator.  11/26/2022, 3:57 PM

## 2022-11-26 NOTE — Care Management Important Message (Signed)
Important Message  Patient Details  Name: Leslie [name] MRN: [id number] Date of Birth: 1941-09-16   Medicare Important Message Given:  Yes     [name] 11/26/2022, 12:41 PM

## 2022-11-26 NOTE — Progress Notes (Signed)
Inpatient Rehabilitation Care Coordinator Discharge Note   Patient Details  Name: Leslie [name] MRN: [id number] Date of Birth: 01/27/1941   Discharge location: D/c to acute  Length of Stay:    Discharge activity level: sup  Home/community participation: daughter and family  Patient response XB:MWUXLK Literacy - How often do you need to have someone help you when you read instructions, pamphlets, or other written material from your doctor or pharmacy?: Never  Patient response GM:WNUUVO Isolation - How often do you feel lonely or isolated from those around you?: Patient unable to respond  Services provided included: SW, Neuropsych, Pharmacy, TR, CM, RN, SLP, OT, PT, RD, MD  Financial Services:  Financial Services Utilized: [place] [employer]  Choices offered to/list presented to: pt and daughter  Follow-up services arranged:  [place]: Alvis Lemmings at d/c from acute         Patient response to transportation need: Is the patient able to respond to transportation needs?: Yes In the past 12 months, has lack of transportation kept you from medical appointments or from getting medications?: No In the past 12 months, has lack of transportation kept you from meetings, work, or from getting things needed for daily living?: No    Comments (or additional information):  Patient/Family verbalized understanding of follow-up arrangements:  Yes  Individual responsible for coordination of the follow-up plan: pamela (dauhgter)  Confirmed correct DME delivered: Dyanne Iha 11/26/2022    Dyanne Iha

## 2022-11-26 NOTE — Anesthesia Procedure Notes (Signed)
Procedure Name: MAC Date/Time: 11/26/2022 11:59 AM  Performed by: Lowella Dell, CRNAPre-anesthesia Checklist: Patient identified, Emergency Drugs available, Suction available, Patient being monitored and Timeout performed Patient Re-evaluated:Patient Re-evaluated prior to induction Oxygen Delivery Method: Nasal cannula Placement Confirmation: positive ETCO2 Dental Injury: Teeth and Oropharynx as per pre-operative assessment

## 2022-11-26 NOTE — Consult Note (Signed)
I have placed a request via Secure Chat to Dr. Powell requesting photos of the wound areas of concern to be placed in the EMR.      Tanica Gaige MSN,RN,CWOCN, CNS, CWON-AP [phone]  

## 2022-11-26 NOTE — Progress Notes (Signed)
Inpatient Rehabilitation Admissions Coordinator   Patient has had issues with orthostasis limiting her abilities on CIR. Now s/p cardioversion. I await therapy re evals after procedure to assist with planning if I can readmit to CIR and pursue Auth with [facility]. I will follow up tomorrow.  Danne Baxter, RN, MSN Rehab Admissions Coordinator [phone] 11/26/2022 4:07 PM

## 2022-11-26 NOTE — Progress Notes (Signed)
SLP Cancellation Note  Patient Details Name: Leslie [name] MRN: [id number] DOB: [birth year]   Cancelled treatment:    Pt was at procedure (TEE) during initial attempts to evaluate cognition. Will f/u next date.  Faatimah Spielberg L. Tivis Ringer, MA CCC/SLP Clinical Specialist - Acute Care SLP Acute Rehabilitation Services Office number [phone]        Leslie [name] 11/26/2022, 2:15 PM

## 2022-11-26 NOTE — Progress Notes (Signed)
OT Cancellation Note  Patient Details Name: Leslie [name] MRN: [id number] DOB: [birth year]   Cancelled Treatment:    Reason Eval/Treat Not Completed: Patient at procedure or test/ unavailable (pt off unit, will follow up for OT evaluation as schedule permits)  Renaye Rakers, OTD, OTR/L SecureChat Preferred Acute Rehab [phone] - [place] 11/26/2022, 10:50 AM

## 2022-11-26 NOTE — Transfer of Care (Signed)
Immediate Anesthesia Transfer of Care Note  Patient: Leslie [name]  Procedure(s) Performed: TRANSESOPHAGEAL ECHOCARDIOGRAM (TEE) CARDIOVERSION BUBBLE STUDY  Patient Location: Endoscopy Unit  Anesthesia Type:MAC  Level of Consciousness: drowsy and patient cooperative  Airway & Oxygen Therapy: Patient Spontanous Breathing and Patient connected to nasal cannula oxygen  Post-op Assessment: Report given to RN and Post -op Vital signs reviewed and stable  Post vital signs: Reviewed and stable  Last Vitals:  Vitals Value Taken Time  BP 101/61   Temp    Pulse 71   Resp 14   SpO2 100 on 2L [place]     Last Pain:  Vitals:   11/26/22 1054  TempSrc: Tympanic  PainSc: 0-No pain         Complications: No notable events documented.

## 2022-11-26 NOTE — Anesthesia Preprocedure Evaluation (Addendum)
Anesthesia Evaluation  Patient identified by MRN, date of birth, ID band Patient awake    Reviewed: Allergy & Precautions, H&P , NPO status , Patient's Chart, lab work & pertinent test results  Airway Mallampati: II  TM Distance: >3 FB Neck ROM: Full    Dental no notable dental hx. (+) Teeth Intact, Dental Advisory Given   Pulmonary COPD, former smoker   Pulmonary exam normal breath sounds clear to auscultation       Cardiovascular +CHF  + dysrhythmias Atrial Fibrillation  Rhythm:Irregular Rate:Tachycardia     Neuro/Psych CVA  negative psych ROS   GI/Hepatic Neg liver ROS, PUD,,,  Endo/Other  Hypothyroidism    Renal/GU Renal disease  negative genitourinary   Musculoskeletal  (+) Arthritis , Osteoarthritis,    Abdominal   Peds  Hematology negative hematology ROS (+)   Anesthesia Other Findings   Reproductive/Obstetrics negative OB ROS                             Anesthesia Physical Anesthesia Plan  ASA: 3  Anesthesia Plan: General   Post-op Pain Management: Minimal or no pain anticipated   Induction: Intravenous  PONV Risk Score and Plan: 3 and Propofol infusion and Treatment may vary due to age or medical condition  Airway Management Planned: Natural Airway and Nasal Cannula  Additional Equipment:   Intra-op Plan:   Post-operative Plan:   Informed Consent: I have reviewed the patients History and Physical, chart, labs and discussed the procedure including the risks, benefits and alternatives for the proposed anesthesia with the patient or authorized representative who has indicated his/her understanding and acceptance.     Dental advisory given  Plan Discussed with: CRNA  Anesthesia Plan Comments:        Anesthesia Quick Evaluation

## 2022-11-26 NOTE — Anesthesia Postprocedure Evaluation (Signed)
Anesthesia Post Note  Patient: Leslie [name]  Procedure(s) Performed: TRANSESOPHAGEAL ECHOCARDIOGRAM (TEE) CARDIOVERSION BUBBLE STUDY     Patient location during evaluation: Endoscopy Anesthesia Type: General Level of consciousness: awake and alert Pain management: pain level controlled Vital Signs Assessment: post-procedure vital signs reviewed and stable Respiratory status: spontaneous breathing, nonlabored ventilation and respiratory function stable Cardiovascular status: blood pressure returned to baseline and stable Postop Assessment: no apparent nausea or vomiting Anesthetic complications: no  No notable events documented.  Last Vitals:  Vitals:   11/26/22 1250 11/26/22 1310  BP: 111/76 121/74  Pulse: 78 77  Resp: 15 18  Temp:    SpO2: 95% 96%    Last Pain:  Vitals:   11/26/22 1310  TempSrc: Oral  PainSc: 0-No pain                 Sendy Pluta,W. EDMOND

## 2022-11-26 NOTE — CV Procedure (Signed)
INDICATIONS: Afib  PROCEDURE:   Informed consent was obtained prior to the procedure. The risks, benefits and alternatives for the procedure were discussed and the patient comprehended these risks.  Risks include, but are not limited to, cough, sore throat, vomiting, nausea, somnolence, esophageal and stomach trauma or perforation, bleeding, low blood pressure, aspiration, pneumonia, infection, trauma to the teeth and death.    After a procedural time-out, the oropharynx was anesthetized with 20% benzocaine spray.   During this procedure the patient was administered propofol per anesthesia.  The patient's heart rate, blood pressure, and oxygen saturation were monitored continuously during the procedure. The period of conscious sedation was 30 minutes, of which I was present face-to-face 100% of this time.  The transesophageal probe was inserted in the esophagus and stomach without difficulty and multiple views were obtained.  The patient was kept under observation until the patient left the procedure room.  The patient left the procedure room in stable condition.   Agitated microbubble saline contrast was administered.  COMPLICATIONS:    There were no immediate complications.  FINDINGS:   FORMAL ECHOCARDIOGRAM REPORT PENDING LVEF 20-25% No LA appendage thrombus Severe MR, likely secondary to LV dysfunction. Commissural jets, multiple.   RECOMMENDATIONS:     Proceed to cardioversion  Procedure: Electrical Cardioversion Indications:  Atrial Fibrillation  Procedure Details:  Consent: Risks of procedure as well as the alternatives and risks of each were explained to the (patient/caregiver).  Consent for procedure obtained.  Time Out: Verified patient identification, verified procedure, site/side was marked, verified correct patient position, special equipment/implants available, medications/allergies/relevent history reviewed, required imaging and test results available.  PERFORMED.  Patient placed on cardiac monitor, pulse oximetry, supplemental oxygen as necessary.  Sedation given:  propofol per anesthesia Pacer pads placed anterior and posterior chest.  Cardioverted 1 time(s).  Cardioversion with synchronized biphasic 120J shock.  Evaluation: Findings: Post procedure EKG shows: NSR Complications: None Patient did tolerate procedure well.  Time Spent Directly with the Patient:  60 minutes   Elouise Munroe 11/26/2022, 12:42 PM

## 2022-11-26 NOTE — Interval H&P Note (Signed)
History and Physical Interval Note:  11/26/2022 11:48 AM  Leslie [name]  has presented today for surgery, with the diagnosis of afib.  The various methods of treatment have been discussed with the patient and family. After consideration of risks, benefits and other options for treatment, the patient has consented to  Procedure(s): TRANSESOPHAGEAL ECHOCARDIOGRAM (TEE) (N/A) CARDIOVERSION (N/A) as a surgical intervention.  The patient's history has been reviewed, patient examined, no change in status, stable for surgery.  I have reviewed the patient's chart and labs.  Questions were answered to the patient's satisfaction.     Elouise Munroe

## 2022-11-27 ENCOUNTER — Ambulatory Visit: Payer: Medicare HMO | Admitting: General Practice

## 2022-11-27 ENCOUNTER — Inpatient Hospital Stay (HOSPITAL_COMMUNITY): Payer: Medicare HMO

## 2022-11-27 DIAGNOSIS — I951 Orthostatic hypotension: Secondary | ICD-10-CM | POA: Diagnosis not present

## 2022-11-27 DIAGNOSIS — I4891 Unspecified atrial fibrillation: Secondary | ICD-10-CM | POA: Diagnosis not present

## 2022-11-27 DIAGNOSIS — I5020 Unspecified systolic (congestive) heart failure: Secondary | ICD-10-CM | POA: Diagnosis not present

## 2022-11-27 MED ORDER — AMIODARONE HCL 200 MG PO TABS
200.0000 mg | ORAL_TABLET | Freq: Two times a day (BID) | ORAL | Status: DC
  Administered 2022-11-27 – 2022-11-28 (×3): 200 mg via ORAL
  Filled 2022-11-27 (×3): qty 1

## 2022-11-27 MED ORDER — AMIODARONE HCL 200 MG PO TABS: 200.0000 mg | ORAL_TABLET | Freq: Every day | ORAL | Status: DC

## 2022-11-27 NOTE — Progress Notes (Signed)
Inpatient Rehabilitation Admissions Coordinator   I spoke with daughter after patient worked with PT today. I will discuss her progress with Dr Letta Pate who was her Rehab MD, to assess if if he feels I need to pursue [facility] approval to possible readmit her for a few days of therapy vs progress on acute in a few days to be able to directly discharge home. I will follow up in the am.  Danne Baxter, RN, MSN Rehab Admissions Coordinator [phone] 11/27/2022 3:26 PM

## 2022-11-27 NOTE — Progress Notes (Signed)
Inpatient Rehabilitation Admissions Coordinator   I met at bedside with patient . No family present. I will contact her daughter, Leslie [name], to further discuss dispo options and concerns. Patient readmitted from [place] to acute hospital on 12/15 with her planned discharge was for 12/18. I have left her daughter, Leslie [name] , a voicemail to contact me.  Danne Baxter, RN, MSN Rehab Admissions Coordinator [phone] 11/27/2022 2:39 PM

## 2022-11-27 NOTE — Care Management (Signed)
  Transition of Care [facility]) Screening Note   Patient Details  Name: Leslie [name] Date of Birth: [birth year]   Transition of Care [facility]) CM/SW Contact:    Bethena Roys, RN Phone Number: 11/27/2022, 3:19 PM    Transition of Care Department [facility]) has reviewed the patient. Patient was previously at CIR. Presented for hypotension and atrial flutter. CIR Admissions Coordinator is following the patient to see if she will be a readmit to CIR. Case Manager will continue to monitor patient advancement through interdisciplinary progression rounds. If new patient transition needs arise, please place a TOC consult.

## 2022-11-27 NOTE — Consult Note (Signed)
WOC Nurse Consult Note: Reason for Consult: buttocks  Wound type: patchy areas of partial thickness skin loss related to moisture d/t incontinence; purewick in place to help control moisture  Wound bed: red, patchy areas consistent with probable candidiasis  Dressing procedure/placement/frequency: Topical treatment provided for bedside nurse to perform:  Apply to buttocks bid and prn turning and cleaning; apply antifungal powder on top. Please re-consult if further assistance is needed.  Thank-you,  Julien Girt MSN, [place], [place], [place], [place]

## 2022-11-27 NOTE — Progress Notes (Signed)
Rounding Note    Patient Name: Leslie [name] Date of Encounter: 11/28/2022  [place] Cardiologist: Peter Martinique, MD   Subjective   Feels better today. No chest pain or SOB.  Remains in NSR Repeat TTE pending  Inpatient Medications    Scheduled Meds:  amiodarone  200 mg Oral BID   [START ON 12/11/2022] amiodarone  200 mg Oral Daily   apixaban  5 mg Oral BID   vitamin B-12  1,000 mcg Oral Daily   famotidine  20 mg Oral Daily   feeding supplement  237 mL Oral TID BM   Gerhardt's butt cream   Topical BID   levothyroxine  100 mcg Oral Q0600   midodrine  2.5 mg Oral TID WC   mouth rinse  15 mL Mouth Rinse 4 times per day   rosuvastatin  10 mg Oral Daily   senna-docusate  2 tablet Oral BID   Continuous Infusions:   PRN Meds: acetaminophen, alum & mag hydroxide-simeth, bisacodyl **OR** sorbitol, calcium carbonate, camphor-menthol, guaiFENesin-dextromethorphan, ipratropium-albuterol, melatonin, mouth rinse, prochlorperazine **OR** prochlorperazine **OR** prochlorperazine, sodium phosphate   Vital Signs    Vitals:   11/27/22 1555 11/27/22 2040 11/28/22 0439 11/28/22 0801  BP: 137/78 119/74 (!) 141/76 124/81  Pulse: 65 60 73 90  Resp: '15 15 18 16  '$ Temp: 98 F (36.7 C) 97.6 F (36.4 C) 97.6 F (36.4 C) 98.1 F (36.7 C)  TempSrc: Oral Oral Oral Oral  SpO2: 95% 97% 97% 99%  Weight:   66.7 kg     Intake/Output Summary (Last 24 hours) at 11/28/2022 0845 Last data filed at 11/28/2022 0439 Gross per 24 hour  Intake 201.64 ml  Output 250 ml  Net -48.36 ml      11/28/2022    4:39 AM 11/27/2022    4:45 AM 11/26/2022    3:38 AM  Last 3 Weights  Weight (lbs) 147 lb 149 lb 14.6 oz 141 lb 9.6 oz  Weight (kg) 66.679 kg 68 kg 64.229 kg      Telemetry    NSR with PACs- Personally Reviewed  ECG    No new tracing today- Personally Reviewed  Physical Exam   GEN: No acute distress. Sitting comfortably in bed Neck: No JVD Cardiac: RRR, 1/6  systolic murmur Respiratory: CTAB GI: Soft, nontender, non-distended  MS: No edema; No deformity. Neuro:  Nonfocal  Psych: Normal affect   Labs    High Sensitivity Troponin:   Recent Labs  Lab 10/29/22 1552 10/29/22 2018 10/31/22 1540 10/31/22 1736  TROPONINIHS '13 11 12 14     '$ Chemistry Recent Labs  Lab 11/23/22 0842 11/23/22 2043 11/25/22 0130 11/26/22 0608  NA 138  --  141 139  K 3.8  --  3.8 3.5  CL 103  --  107 105  CO2 27  --  28 26  GLUCOSE 134*  --  109* 105*  BUN 19  --  12 10  CREATININE 1.36*  --  1.26* 1.25*  CALCIUM 9.8  --  9.5 9.5  MG  --  2.1  --   --   GFRNONAA 39*  --  43* 44*  ANIONGAP 8  --  6 8    Lipids No results for input(s): "CHOL", "TRIG", "HDL", "LABVLDL", "LDLCALC", "CHOLHDL" in the last 168 hours.  Hematology Recent Labs  Lab 11/25/22 0130 11/26/22 0608  WBC 9.9 10.0  RBC 4.41 4.51  HGB 12.2 12.8  HCT 38.8 39.1  MCV 88.0 86.7  MCH 27.7 28.4  MCHC 31.4 32.7  RDW 15.6* 15.7*  PLT 219 208   Thyroid  Recent Labs  Lab 11/25/22 0130  TSH 11.301*  FREET4 1.06    BNPNo results for input(s): "BNP", "PROBNP" in the last 168 hours.  DDimer No results for input(s): "DDIMER" in the last 168 hours.   Radiology    DG CHEST PORT 1 VIEW  Result Date: 11/27/2022 CLINICAL DATA:  [place] EXAM: PORTABLE CHEST 1 VIEW COMPARISON:  11/20/2022 FINDINGS: Post infectious inflammatory scarring in the right lower lung. Left lung is clear. No pleural effusion or pneumothorax. The heart is normal in size. IMPRESSION: Post infectious inflammatory scarring in the right lower lung. Electronically Signed   By: Julian Hy [name]   On: 11/27/2022 20:05   ECHO TEE  Result Date: 11/26/2022    TRANSESOPHOGEAL ECHO REPORT   Patient Name:   Leslie [name] Date of Exam: 11/26/2022 Medical Rec #:  [id number]           Height:       67.5 in Accession #:    [id number]          Weight:       141.6 lb Date of Birth:  [birth year]           BSA:           1.756 m Patient Age:    81 years            BP:           148/122 mmHg Patient Gender: F                   HR:           148 bpm. Exam Location:  Inpatient Procedure: Transesophageal Echo, Color Doppler and Cardiac Doppler Indications:     Afib  History:         Patient has prior history of Echocardiogram examinations, most                  recent 10/30/2022. Stroke. Chronic kidney disease.  Sonographer:     Darlina Sicilian RDCS Referring Phys:  [id number] Margie Billet Diagnosing Phys: Cherlynn Kaiser MD PROCEDURE: After discussion of the risks and benefits of a TEE, an informed consent was obtained from the patient. TEE procedure time was 11 minutes. The transesophogeal probe was passed without difficulty through the esophogus of the patient. Imaged were obtained with the patient in a left lateral decubitus position. Local oropharyngeal anesthetic was provided with Cetacaine. Sedation performed by different physician. The patient was monitored while under deep sedation. Anesthestetic sedation was provided intravenously by Anesthesiology: 72.'74mg'$  of Propofol, '60mg'$  of Lidocaine. Image quality was good. The patient's vital signs; including heart rate, blood pressure, and oxygen saturation; remained stable throughout the procedure. The patient developed no complications during the procedure. A successful direct current cardioversion was performed at 120 joules with 1 attempt.  IMPRESSIONS  1. Left ventricular ejection fraction, by estimation, is 20%. The left ventricle has severely decreased function.  2. Right ventricular systolic function is severely reduced. The right ventricular size is mildly enlarged.  3. Left atrial size was moderately dilated. No left atrial/left atrial appendage thrombus was detected. The LAA emptying velocity was 35 cm/s.  4. A small pericardial effusion is present.  5. The mitral valve is grossly normal. Severe mitral valve regurgitation, likely secondary to LV dysfunction and atrial  functional MR. Multiple jets, appears commissural.  6.  The aortic valve is grossly normal. Aortic valve regurgitation is mild.  7. There is mild (Grade II) atheroma plaque involving the aortic arch and descending aorta. Conclusion(s)/Recommendation(s): No LA/LAA thrombus identified. Successful cardioversion performed with restoration of normal sinus rhythm. FINDINGS  Left Ventricle: Left ventricular ejection fraction, by estimation, is 20%. The left ventricle has severely decreased function. The left ventricular internal cavity size was normal in size. Right Ventricle: The right ventricular size is mildly enlarged. Right vetricular wall thickness was not well visualized. Right ventricular systolic function is severely reduced. Left Atrium: Left atrial size was moderately dilated. No left atrial/left atrial appendage thrombus was detected. The LAA emptying velocity was 35 cm/s. Right Atrium: Right atrial size was normal in size. Pericardium: A small pericardial effusion is present. Mitral Valve: The mitral valve is grossly normal. There is mild thickening of the mitral valve leaflet(s). Severe mitral valve regurgitation. Tricuspid Valve: The tricuspid valve is normal in structure. Tricuspid valve regurgitation is mild. Aortic Valve: The aortic valve is grossly normal. Aortic valve regurgitation is mild. Pulmonic Valve: The pulmonic valve was normal in structure. Pulmonic valve regurgitation is trivial. Aorta: The aortic root and ascending aorta are structurally normal, with no evidence of dilitation. There is mild (Grade II) atheroma plaque involving the aortic arch and descending aorta. Venous: A systolic blunting flow pattern is recorded from the right upper pulmonary vein and the left upper pulmonary vein. IAS/Shunts: No atrial level shunt detected by color flow Doppler. Additional Comments: Spectral Doppler performed.  AORTA Ao Root diam: 3.40 cm Ao Asc diam:  3.10 cm MR Peak grad:    41.0 mmHg MR Mean grad:     29.0 mmHg MR Vmax:         320.00 cm/s MR Vmean:        255.0 cm/s MR PISA:         3.08 cm MR PISA Eff ROA: 37 mm MR PISA Radius:  0.70 cm Cherlynn Kaiser MD Electronically signed by Cherlynn Kaiser MD Signature Date/Time: 11/26/2022/3:19:51 PM    Final     Cardiac Studies  TEE 11/26/22: IMPRESSIONS     1. Left ventricular ejection fraction, by estimation, is 20%. The left  ventricle has severely decreased function.   2. Right ventricular systolic function is severely reduced. The right  ventricular size is mildly enlarged.   3. Left atrial size was moderately dilated. No left atrial/left atrial  appendage thrombus was detected. The LAA emptying velocity was 35 cm/s.   4. A small pericardial effusion is present.   5. The mitral valve is grossly normal. Severe mitral valve regurgitation,  likely secondary to LV dysfunction and atrial functional MR. Multiple  jets, appears commissural.   6. The aortic valve is grossly normal. Aortic valve regurgitation is  mild.   7. There is mild (Grade II) atheroma plaque involving the aortic arch and  descending aorta.   Conclusion(s)/Recommendation(s): No LA/LAA thrombus identified. Successful  cardioversion performed with restoration of normal sinus rhythm.    Patient Profile     81 y.o. 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 Patient was recently admitted from 10/29/2022 to 11/08/2022 for new onset atrial fibrillation and was found to have a reduced EF of 40-45%. Hospitalization was complicated by community acquired pneumonia (which was initially diagnosed prior to this admission) with right pleural  effusion s/p thoracentesis on 10/30/2022, and acute stroke requiring thrombectomy. She was discharged to CIR on 11/08/2022. She went back into rapid atrial  fibrillation with rates in the 150s on 11/23/2022 and was hypotensive with it. Therefore, she was readmitted as an inpatient and Cardiology consulted for assistance.   Assessment & Plan    #Persistent Atrial Fibrillation:  Patient with recent admission for stroke found to have new onset atrial fibrillation. Echo showed LVEF of 40-45%. She was started on Diltiazem and apixaban. She was discharged to CIR but represented on this admission for recurrent rapid atrial fibrillation with rates in the 150s with associated hypotension for which Cardiology was consulted. Given difficulty with rate control and soft blood pressures limiting nodal agents, she underwent TEE/DCCV on 11/26/22 with restoration of NSR. Now transitioned to PO amiodarone.  - S/p successful TEE/DCCV on 11/26/22 - Continue amiodarone '200mg'$  BID x 2 weeks and then '200mg'$  daily thereafter - Stopped Diltiazem given hypotension and reduced EF - Continue Eliquis '5mg'$  BID   #Chronic Systolic CHF:  Echo during recent admission showed LVEF of 40-45% with global hypokinesis , moderate asymmetric LVH of the basal-septal segment and moderate to severe MR. TEE on 11/26/22 with interval drop in EF to 20% in the setting of Afib with RVR. Currently euvolemic and compensated with no chest pain. Will repeat TTE to reassess now back in NSR. 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 Dilt stopped and has been started on midodrine due to persistence of symptoms. - Stopped dilt and midodrine added - Continue TED hose and abdominal binder if needed to help with orthostasis.   #Severe MR: Severe on TEE. Likely functional. Will repeat TTE to reassess now that back in NSR.   For questions or updates, please  contact [place] Please consult [email or web address] for contact info under        Signed, Freada Bergeron, MD  11/28/2022, 8:45 AM

## 2022-11-27 NOTE — Progress Notes (Signed)
PROGRESS NOTE    Leslie [name]  VPX:[id number] DOB: [birth year] DOA: 11/23/2022 PCP: Leslie Side., [name]  No chief complaint on file.   Brief Narrative:  Leslie [name] is Leslie [name] 81 y.o. female with medical history significant of CVA, atrial flutter fibrillation/flutter, hypothyroidism, GERD who is being transferred back to inpatient side after being found to be in atrial flutter with heart rates into the 160s and low blood pressures.    Just recently been hospitalized from 11/20-11/30, after being found to be in atrial fibrillation with RVR after being seen by primary care provider for lightheadedness.  She was started on Cardizem drip and anticoagulation.  CT of the chest had noted right layering pleural effusion and right upper lobe pneumonia.  Patient was treated with Rocephin and azithromycin which was completed on 11/27.  Thoracentesis was performed which noted transudative effusion.  Echocardiogram noted EF to be 40- 45% with LV global hypokinesis.  Patient had been tried on metoprolol but reported shortness of breath for which it was discontinued.  Patient was placed on Cardizem and transition to Eliquis with plans for outpatient cardioversion.  However patient found to have left-sided hemiparesis and left-sided neglect on 11/25.  Imaging significant for right M1 occlusion, and patient underwent cerebral angiogram with thrombectomy of the right MCA occluded segment with complete revascularization.  MRI revealed Leslie [name] small acute infarct of the right frontal, temporal lobe, and caudate body with chronic small vessel disease.  Neurology had thought symptoms were secondary to atrial fibrillation.    Since admitted into inpatient rehab patient had been having issues hypotension for which Cardizem CD was reduced from 360 mg to 300 mg.  They had been wrapping the patient's legs as well as placing an abdominal binder without significant improvement.  Cardizem dosing had been changed to 75 mg 4  times daily, then 60 mg 4 times daily, and currently she was on 30 mg 4 times daily.  Despite these changes patient was still noted to be orthostatic.    She was readmitted and has now had TEE cardioversion, back in sinus rhythm.  Currently she still has some orthostatic hypotension, but seems to be improving.  She's been started on midodrine.   Some mild delirium as well, workup pending.  Hopefully d/c soon, potentially back to inpatient rehab?  Assessment & Plan:   Active Problems:   Atrial fibrillation with rapid ventricular response ([name])   Hypotension   Heart failure with reduced ejection fraction ([name])   History of CVA with residual deficit   COPD (chronic obstructive pulmonary disease) ([name])   Hypothyroidism  Atrial fibrillation with RVR Patient has been dealing with low blood pressures for which she had been transition from Cardizem CD 360 mg down to Cardizem 30 mg every 6 hours.  In doing so patient's heart rates became uncontrolled and she was noted to still be hypotensive.  Appears during previous hospitalization amiodarone had been noted to have converted patient back into Leslie [name] sinus rhythm.  She had not tolerated metoprolol due to shortness of breath. -Discontinued Cardizem -appreciate cardiology assistance -> currently on amiodarone.  Continue eliquis.  S/p TEE/DCCV today. -Goal potassium at least 4 and magnesium at least 2 -Continue Eliquis -Appreciate cardiology consultative services, will follow-up for any further recommendations -> s/p TEE cardioversion 12/18.  Currently in sinus rhythm.     Delirium  - delirium precautions - waxing and waning - follow UA/urine culture - TSH elevated, synthroid dose elevated - follow ammonia - repeat  CXR - w/u additionally if persistent  Orthostatic Hypotension  Hypotension hypotension from presentation improved Continue TED hose and abdominal binder  continued orthostasis 12/19 with therapy despite ted hose and abdominal  binder, but better than previous days.  She's been started on midodrine.  Continue to titrate this as needed.  - follow repeat orthostatics -Goal MAP at least 65   Heart failure with reduced EF  Severe MR On physical exam patient's lung sounds clear and no significant lower extremity edema appreciated.  Echocardiogram had noted EF to be 40-45 % with global hypokinesis.  Patient was also noted to have moderate to severe mitral regurgitation. Check I&O's and daily weights needs repeat echo in 2-3 months consider ischemic evaluation based on repeat echo (concern cardiomyopathy related to tachyarrhythmia, repeat echo 2-3 months after restoration of sinus rhythm)   Vitamin B12 deficiency B12 low normal. MMA elevated. Continue b12  History of CVA with residual deficits Patient had Leslie [name] right middle cerebral artery embolism on 11/25 and underwent thrombectomy. MRI revealed Leslie [name] small acute infarct of the right frontal, temporal lobe, and caudate body with chronic small vessel disease.  -PT/OT/speech to eval and treat   COPD Patient without acute exacerbation.   Hypothyroidism -Check TSH (elevated) -Continue levothyroxine (increased dose)    DVT prophylaxis: eliquis Code Status: full Family Communication: none Disposition:   Status is: Inpatient Remains inpatient appropriate because: persistent RVR   Consultants:  cardiology  Procedures:  none  Antimicrobials:  Anti-infectives (From admission, onward)    None       Subjective: No new complaints Feels better after cardioversion  Objective: Vitals:   11/26/22 1241 11/26/22 1250 11/26/22 1310 11/26/22 1556  BP: 105/78 111/76 121/74 104/67  Pulse: 77 78 77   Resp: '15 15 18 16  '$ Temp:    98.1 F (36.7 C)  TempSrc:   Oral Oral  SpO2: 96% 95% 96% 96%  Weight:        Intake/Output Summary (Last 24 hours) at 11/26/2022 1557 Last data filed at 11/26/2022 1203 Gross per 24 hour  Intake 300 ml  Output 300 ml  Net 0 ml    Filed Weights   11/24/22 0403 11/25/22 0443 11/26/22 0338  Weight: 71.5 kg 66.3 kg 64.2 kg    Examination:  General: No acute distress. Cardiovascular: RRR Lungs: unlabored Abdomen: Soft, nontender, nondistended  Neurological: Alert and oriented 3. Moves all extremities 4. Cranial nerves II through XII grossly intact. Extremities: No clubbing or cyanosis. No edema  Data Reviewed: I have personally reviewed following labs and imaging studies  CBC: Recent Labs  Lab 11/25/22 0130 11/26/22 0608  WBC 9.9 10.0  HGB 12.2 12.8  HCT 38.8 39.1  MCV 88.0 86.7  PLT 219 213    Basic Metabolic Panel: Recent Labs  Lab 11/20/22 0527 11/23/22 0842 11/23/22 2043 11/25/22 0130 11/26/22 0608  NA 139 138  --  141 139  K 3.7 3.8  --  3.8 3.5  CL 103 103  --  107 105  CO2 27 27  --  28 26  GLUCOSE 116* 134*  --  109* 105*  BUN 18 19  --  12 10  CREATININE 1.21* 1.36*  --  1.26* 1.25*  CALCIUM 9.8 9.8  --  9.5 9.5  MG  --   --  2.1  --   --     GFR: Estimated Creatinine Clearance: 35.6 mL/min (Cassadie Pankonin) (by C-G formula based on SCr of 1.25 mg/dL (H)).  Liver Function  Tests: No results for input(s): "AST", "ALT", "ALKPHOS", "BILITOT", "PROT", "ALBUMIN" in the last 168 hours.  CBG: No results for input(s): "GLUCAP" in the last 168 hours.   No results found for this or any previous visit (from the past 240 hour(s)).       Radiology Studies: ECHO TEE  Result Date: 11/26/2022    TRANSESOPHOGEAL ECHO REPORT   Patient Name:   [name] Date of Exam: 11/26/2022 Medical Rec #:  [id number]           Height:       67.5 in Accession #:    [id number]          Weight:       141.6 lb Date of Birth:  09-16-1941           BSA:          1.756 m Patient Age:    72 years            BP:           148/122 mmHg Patient Gender: F                   HR:           148 bpm. Exam Location:  Inpatient Procedure: Transesophageal Echo, Color Doppler and Cardiac Doppler Indications:     Afib   History:         Patient has prior history of Echocardiogram examinations, most                  recent 10/30/2022. Stroke. Chronic kidney disease.  Sonographer:     Darlina Sicilian RDCS Referring Phys:  [id number] Margie Billet Diagnosing Phys: Cherlynn Kaiser MD PROCEDURE: After discussion of the risks and benefits of Sandie Swayze TEE, an informed consent was obtained from the patient. TEE procedure time was 11 minutes. The transesophogeal probe was passed without difficulty through the esophogus of the patient. Imaged were obtained with the patient in Honour Schwieger left lateral decubitus position. Local oropharyngeal anesthetic was provided with Cetacaine. Sedation performed by different physician. The patient was monitored while under deep sedation. Anesthestetic sedation was provided intravenously by Anesthesiology: 72.'74mg'$  of Propofol, '60mg'$  of Lidocaine. Image quality was good. The patient's vital signs; including heart rate, blood pressure, and oxygen saturation; remained stable throughout the procedure. The patient developed no complications during the procedure. Olinda Nola successful direct current cardioversion was performed at 120 joules with 1 attempt.  IMPRESSIONS  1. Left ventricular ejection fraction, by estimation, is 20%. The left ventricle has severely decreased function.  2. Right ventricular systolic function is severely reduced. The right ventricular size is mildly enlarged.  3. Left atrial size was moderately dilated. No left atrial/left atrial appendage thrombus was detected. The LAA emptying velocity was 35 cm/s.  4. Cori Henningsen small pericardial effusion is present.  5. The mitral valve is grossly normal. Severe mitral valve regurgitation, likely secondary to LV dysfunction and atrial functional MR. Multiple jets, appears commissural.  6. The aortic valve is grossly normal. Aortic valve regurgitation is mild.  7. There is mild (Grade II) atheroma plaque involving the aortic arch and descending aorta.  Conclusion(s)/Recommendation(s): No LA/LAA thrombus identified. Successful cardioversion performed with restoration of normal sinus rhythm. FINDINGS  Left Ventricle: Left ventricular ejection fraction, by estimation, is 20%. The left ventricle has severely decreased function. The left ventricular internal cavity size was normal in size. Right Ventricle: The right ventricular size is mildly enlarged. Right vetricular wall thickness  was not well visualized. Right ventricular systolic function is severely reduced. Left Atrium: Left atrial size was moderately dilated. No left atrial/left atrial appendage thrombus was detected. The LAA emptying velocity was 35 cm/s. Right Atrium: Right atrial size was normal in size. Pericardium: Dorrell Mitcheltree small pericardial effusion is present. Mitral Valve: The mitral valve is grossly normal. There is mild thickening of the mitral valve leaflet(s). Severe mitral valve regurgitation. Tricuspid Valve: The tricuspid valve is normal in structure. Tricuspid valve regurgitation is mild. Aortic Valve: The aortic valve is grossly normal. Aortic valve regurgitation is mild. Pulmonic Valve: The pulmonic valve was normal in structure. Pulmonic valve regurgitation is trivial. Aorta: The aortic root and ascending aorta are structurally normal, with no evidence of dilitation. There is mild (Grade II) atheroma plaque involving the aortic arch and descending aorta. Venous: Armani Brar systolic blunting flow pattern is recorded from the right upper pulmonary vein and the left upper pulmonary vein. IAS/Shunts: No atrial level shunt detected by color flow Doppler. Additional Comments: Spectral Doppler performed.  AORTA Ao Root diam: 3.40 cm Ao Asc diam:  3.10 cm MR Peak grad:    41.0 mmHg MR Mean grad:    29.0 mmHg MR Vmax:         320.00 cm/s MR Vmean:        255.0 cm/s MR PISA:         3.08 cm MR PISA Eff ROA: 37 mm MR PISA Radius:  0.70 cm Cherlynn Kaiser MD Electronically signed by Cherlynn Kaiser MD Signature  Date/Time: 11/26/2022/3:19:51 PM    Final         Scheduled Meds:  apixaban  5 mg Oral BID   vitamin B-12  1,000 mcg Oral Daily   famotidine  20 mg Oral Daily   feeding supplement  237 mL Oral TID BM   Gerhardt's butt cream   Topical BID   levothyroxine  100 mcg Oral Q0600   mouth rinse  15 mL Mouth Rinse 4 times per day   rosuvastatin  10 mg Oral Daily   senna-docusate  2 tablet Oral BID   Continuous Infusions:  amiodarone 30 mg/hr (11/26/22 1149)     LOS: 3 days    Time spent: over 30 min    Fayrene Helper, MD [facility]   To contact the attending provider between 7A-7P or the covering provider during after hours 7P-7A, please log into the web site [email or web address] and access using universal [place] password for that web site. If you do not have the password, please call the hospital operator.  11/26/2022, 3:57 PM

## 2022-11-27 NOTE — Progress Notes (Signed)
Physical Therapy Treatment Patient Details Name: Leslie [name] MRN: [id number] DOB: 1941-03-23 Today's Date: 11/27/2022   History of Present Illness Leslie [name] is a 81 y.o. female who was transferred back to inpatient side (from [facility] being found to be in atrial flutter with heart rates into the 160s and low blood pressures. S/p TEE/cardioversion on 12/18. PHMx: CVA (R MCA)11/03/22, atrial flutter fibrillation/flutter, hypothyroidism, CKD, RA, plantar fascitis, L THA 2022, GERD    PT Comments    Pt seen for PT tx with pt agreeable & daughter present during session. Pt with thigh high ted hose already donned, PT provides total assist for ace wrapping & donning abdominal binder in supine. Pt is able to complete bed mobility with supervision with use of bed rails, min assist for STS & step pivot bed>recliner with RW. Pt demonstrates posterior lean during transfers with decreased awareness. Pt denied c/o symptoms throughout session despite BP dropping in standing. Pt also very anxious re: CLOF & overall prognosis with tangential speech; PT attempting to distract pt throughout mobility. Continue to recommend CIR upon d/c to help maximize independence with mobility & reduce fall risk prior to d/c home.  BP checked in LUE: Supine: 123/68 mmHg (MAP 82) Sitting: 129/87 mmHg (MAP 100) Standing after turning to recliner, standing ~1-2 minutes: 70/55 mmHg (MAP 62) Sitting in recliner: 101/82 mmHg (MAP 90) Sitting in recliner with BLE elevated at end of session: 129/99 mmHg MAP 108    Recommendations for follow up therapy are one component of a multi-disciplinary discharge planning process, led by the attending physician.  Recommendations may be updated based on patient status, additional functional criteria and insurance authorization.  Follow Up Recommendations  Acute inpatient rehab (3hours/day)     Assistance Recommended at Discharge Frequent or constant Supervision/Assistance   Patient can return home with the following A little help with walking and/or transfers;A little help with bathing/dressing/bathroom;Assistance with cooking/housework;Assist for transportation;Direct supervision/assist for medications management;Help with stairs or ramp for entrance   Equipment Recommendations  None recommended by PT    Recommendations for Other Services Rehab consult     Precautions / Restrictions Precautions Precautions: Fall;Other (comment) Precaution Comments: orthostatic Restrictions Weight Bearing Restrictions: No Other Position/Activity Restrictions: TED hose, abdominal binder, ace wraps     Mobility  Bed Mobility Overal bed mobility: Needs Assistance Bed Mobility: Supine to Sit, Rolling Rolling: Supervision   Supine to sit: Supervision, HOB elevated     General bed mobility comments: use of bed rails, slightly extra time to come to sitting EOB    Transfers Overall transfer level: Needs assistance Equipment used: Rolling walker (2 wheels) Transfers: Sit to/from Stand, Bed to chair/wheelchair/BSC Sit to Stand: Min assist   Step pivot transfers: Min assist       General transfer comment: Cuing for hand placement during STS with RW, posterior lean during STS & step pivot.    Ambulation/Gait                   Stairs             Wheelchair Mobility    Modified Rankin (Stroke Patients Only)       Balance Overall balance assessment: Needs assistance Sitting-balance support: Feet supported Sitting balance-Leahy Scale: Fair Sitting balance - Comments: supervision static sitting   Standing balance support: Bilateral upper extremity supported, During functional activity, Reliant on assistive device for balance Standing balance-Leahy Scale: Poor Standing balance comment: Unsteady during step pivot transfer to recliner, posterior lean.  Cognition Arousal/Alertness: Awake/alert Behavior  During Therapy: Anxious Overall Cognitive Status: Impaired/Different from baseline Area of Impairment: Attention, Following commands, Safety/judgement, Awareness, Problem solving                       Following Commands: Follows one step commands with increased time Safety/Judgement: Decreased awareness of safety, Decreased awareness of deficits Awareness: Emergent Problem Solving: Slow processing, Requires verbal cues General Comments: Pt extremely anxious re: current medical issues and prognosis, speaking of "what ifs" throughout session, requiring distraction through all mobility. Tangential in conversation.        Exercises      General Comments General comments (skin integrity, edema, etc.): PT assisted with donning abdominal binder in supine, LE ace wraps to thigh (thigh high ted hose already donned)      Pertinent Vitals/Pain Pain Assessment Pain Assessment: No/denies pain    Home Living                          Prior Function            PT Goals (current goals can now be found in the care plan section) Acute Rehab PT Goals Patient Stated Goal: to feel better PT Goal Formulation: With patient Time For Goal Achievement: 12/09/22 Potential to Achieve Goals: Good Progress towards PT goals: Progressing toward goals    Frequency    Min 3X/week      PT Plan Current plan remains appropriate    Co-evaluation              AM-PAC PT "6 Clicks" Mobility   Outcome Measure  Help needed turning from your back to your side while in a flat bed without using bedrails?: None Help needed moving from lying on your back to sitting on the side of a flat bed without using bedrails?: A Little Help needed moving to and from a bed to a chair (including a wheelchair)?: A Little Help needed standing up from a chair using your arms (e.g., wheelchair or bedside chair)?: A Little Help needed to walk in hospital room?: A Little Help needed climbing 3-5 steps  with a railing? : A Lot 6 Click Score: 18    End of Session   Activity Tolerance: Patient tolerated treatment well;Treatment limited secondary to medical complications (Comment) (limited by low BP in standing) Patient left: in chair;with call bell/phone within reach;with chair alarm set;with family/visitor present Nurse Communication: Mobility status (BP during session) PT Visit Diagnosis: Other abnormalities of gait and mobility (R26.89);Dizziness and giddiness (R42);Unsteadiness on feet (R26.81) Hemiplegia - Right/Left: Left Hemiplegia - dominant/non-dominant: Non-dominant Hemiplegia - caused by: Cerebral infarction     Time: 1583-0940 PT Time Calculation (min) (ACUTE ONLY): 35 min  Charges:  $Therapeutic Activity: 23-37 mins                     Lavone Nian, PT, DPT 11/27/22, 3:16 PM  Waunita Schooner 11/27/2022, 3:12 PM

## 2022-11-28 ENCOUNTER — Inpatient Hospital Stay (HOSPITAL_COMMUNITY): Payer: Medicare HMO

## 2022-11-28 DIAGNOSIS — I5020 Unspecified systolic (congestive) heart failure: Secondary | ICD-10-CM

## 2022-11-28 DIAGNOSIS — I951 Orthostatic hypotension: Secondary | ICD-10-CM | POA: Diagnosis not present

## 2022-11-28 DIAGNOSIS — I6930 Unspecified sequelae of cerebral infarction: Secondary | ICD-10-CM | POA: Diagnosis not present

## 2022-11-28 DIAGNOSIS — I340 Nonrheumatic mitral (valve) insufficiency: Secondary | ICD-10-CM

## 2022-11-28 DIAGNOSIS — I4891 Unspecified atrial fibrillation: Secondary | ICD-10-CM | POA: Diagnosis not present

## 2022-11-28 LAB — AMMONIA: Ammonia: 36 umol/L — ABNORMAL HIGH (ref 9–35)

## 2022-11-28 LAB — URINALYSIS, ROUTINE W REFLEX MICROSCOPIC
Bacteria, UA: NONE SEEN
Bilirubin Urine: NEGATIVE
Glucose, UA: NEGATIVE mg/dL
Ketones, ur: 5 mg/dL — AB
Leukocytes,Ua: NEGATIVE
Nitrite: NEGATIVE
Protein, ur: NEGATIVE mg/dL
Specific Gravity, Urine: 1.017 (ref 1.005–1.030)
pH: 5 (ref 5.0–8.0)

## 2022-11-28 LAB — ECHOCARDIOGRAM COMPLETE
S' Lateral: 3.2 cm
Weight: 2352 oz

## 2022-11-28 LAB — GLUCOSE, CAPILLARY: Glucose-Capillary: 143 mg/dL — ABNORMAL HIGH (ref 70–99)

## 2022-11-28 MED ORDER — AMIODARONE HCL IN DEXTROSE 360-4.14 MG/200ML-% IV SOLN
60.0000 mg/h | INTRAVENOUS | Status: AC
  Administered 2022-11-28: 60 mg/h via INTRAVENOUS
  Filled 2022-11-28: qty 200

## 2022-11-28 MED ORDER — AMIODARONE LOAD VIA INFUSION
150.0000 mg | Freq: Once | INTRAVENOUS | Status: AC
  Administered 2022-11-28: 150 mg via INTRAVENOUS
  Filled 2022-11-28: qty 83.34

## 2022-11-28 MED ORDER — AMIODARONE HCL IN DEXTROSE 360-4.14 MG/200ML-% IV SOLN
60.0000 mg/h | INTRAVENOUS | Status: DC
  Administered 2022-11-29: 60 mg/h via INTRAVENOUS
  Administered 2022-11-29 (×2): 30 mg/h via INTRAVENOUS
  Administered 2022-11-29 – 2022-11-30 (×3): 60 mg/h via INTRAVENOUS
  Filled 2022-11-28 (×6): qty 200

## 2022-11-28 NOTE — Progress Notes (Signed)
Inpatient Rehabilitation Admissions Coordinator   Discussed case with Dr Naaman Plummer. I will begin Auth with [facility] Medicare this morning to pursue readmit to CIR to complete her rehab program before discharge home with family support. I contacted her daughter and she is aware.  Danne Baxter, RN, MSN Rehab Admissions Coordinator [phone] 11/28/2022 9:14 AM

## 2022-11-28 NOTE — Progress Notes (Signed)
Mobility Specialist - Progress Note   11/28/22 1049  Mobility  Activity Transferred from bed to chair  Level of Assistance Contact guard assist, steadying assist  Assistive Device Front wheel walker  Activity Response Tolerated well  Mobility Referral Yes  $Mobility charge 1 Mobility   Pt was received EOB trying to get out of bed to chair. Pt was CG to transfer to chair. Pt requesting wanting to go outside to get some fresh air. Pt was left in chair with all needs met and chair alarm on. RN notified.   Franki Monte  Mobility Specialist Please contact via Solicitor or Rehab office at [phone]

## 2022-11-28 NOTE — Progress Notes (Signed)
   11/28/22 1954  Assess: MEWS Score  Temp 97.6 F (36.4 C)  BP (!) 111/91  MAP (mmHg) 100  Resp 18  SpO2 100 %  Assess: MEWS Score  MEWS Temp 0  MEWS Systolic 0  MEWS Pulse 3  MEWS RR 0  MEWS LOC 0  MEWS Score 3  MEWS Score Color Yellow  Assess: if the MEWS score is Yellow or Red  Were vital signs taken at a resting state? Yes  Focused Assessment No change from prior assessment  Does the patient meet 2 or more of the SIRS criteria? No  Does the patient have a confirmed or suspected source of infection? No  Provider and Rapid Response Notified? No  MEWS guidelines implemented *See Row Information* No, previously yellow, continue vital signs every 4 hours  Treat  MEWS Interventions Administered scheduled meds/treatments  Pain Scale 0-10  Pain Score 0  Assess: SIRS CRITERIA  SIRS Temperature  0  SIRS Pulse 1  SIRS Respirations  0  SIRS WBC 0  SIRS Score Sum  1

## 2022-11-28 NOTE — PMR Pre-admission (Shared)
PMR Admission Coordinator Pre-Admission Assessment  Patient: Leslie [name] is an 81 y.o., female MRN: [id number] DOB: 19-Aug-1941 Height: 5' 7.5" (171.5 cm) Weight: 65.8 kg  Insurance Information HMO: yes    PPO:      PCP:      IPA:      80/20:      OTHER:  PRIMARY: [employer]      Policy#: [id number]      Subscriber: pt CM Name: Farrel Conners      Phone#: [phone] Ext [phone]     Fax#: [phone] Pre-Cert#: [id number] denied 12/29***      Employer:  Benefits:  Phone #: [phone]     Name: 12/20 Eff. Date: 12/10/21     Deduct: none      Out of Pocket Max: $3400       CIR: $295 co pay per day days 1 until 6      SNF: no c co pay days 1 until 20; $196 co pay per day days 21 until 100 Outpatient: $ 10 to $20 per visit     Co-Pay: visits per medical neccesity Home Health: 100%      Co-Pay: none DME: 80%     Co-Pay: 20% Providers: in network  SECONDARY: none  Financial Counselor:       Phone#:   The Engineer, petroleum" for patients in Inpatient Rehabilitation Facilities with attached "Privacy Act [place] Records" was provided and verbally reviewed with: Family  Emergency Contact Information Contact Information     Name Relation Home Work Romeo Daughter [phone]  [phone]   Assunta Gambles [phone]  [phone]      Current Medical History  Patient Admitting Diagnosis: Afib with RVR, CVA  History of Present Illness: 81 year old female with history of CVA, afib/flutter , hypothyroidism, GERD with recent CVA on CIR/inpatient rehabilitation. Recent hospitalization form 11/20 until 11/30 after being found to be in atrial fibrillation with RVR. She was treated with Cardizem drip and anticoagulation. . CT chest revealed right layering pleural effusion and right upper lobe pneumonia. Treated with Rocephin  and azithromycin. Thoracentesis performed. Echo with EF 40 to 45 % with LV global hypokineses. Placed on  Metoprolol but reported short of breath. Placed on Cardizem and transitioned to Eliquis with plans for outpatient cardioversion. Found to develop left sided hemiparesis and left sided neglect on 11/25. Imaging revealed right M1 Occlusion and patient underwent cerebral angiogram with thrombectomy of the right MCA occluded segment with complete revascularization. MRI revealed a small acute infarct of the right frontal, temporal lobe and caudate body with chronic small vessel disease. Neurology felt secondary to atrial fibrillation. Admitted to CIR on 11/ 30/23.  While undergoing inpatient rehabilitation services, she has had issues with hypotension for which Cardizem CD was reduced. Also wrapping pt's legs as well as placing abdominal binder without significant improvement. Further Cardizem dosing changes with patient remaining orthostatic. On 12/15 patient found to have heart rate 160's with afib with RVR and severe ortho stasis. Cardiology consulted and patient readmitted to inpatient acute hospital on 11/23/22.   Patient underwent TEE cardioversion on 12/18, now back in sinus rhythm. Still with some orthostatic hypotension, but is improving. Will titrate as needed.  Started on Midodrine. Continue Eliquis. 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   On 12/20 had episode when up to [facility] of unresponsiveness and returned in rapid atrial fibrillation. Amiodarone gtt  initiated as well as digoxin. Repeat imaging with new CVA ruled out. No seizure on EEG ,monitoring. To continue with midodrine and TED hose/binder/abdominal binder for ortho stasis.   Underwent second cardioversion on 12/27. Has undergone amiodarone load. Unable to tolerate nodal agents due to significant orthostatic hypotension. Likely will need amiodarone long term. Continue Eliquis.   [facility] record [facility] has been reviewed by the rehabilitation admission coordinator and  physician.  Past Medical History  Past Medical History:  Diagnosis Date   Arthritis    Back pain due to injury    fell off a ladder and fractured back-T12 down (multiple surgeries w/ onging pain)   Chronic kidney disease    "stage 3 kidney failure" improving per pt   Hypothyroidism    Insomnia    Plantar fasciitis    Stomach ulcer ~2000   Stroke [facility])    TIA X2   2001, jan 2017   Vertigo, benign paroxysmal, bilateral    Vitamin D deficiency    Has the patient had major surgery during 100 days prior to admission? Yes  Family History   family history includes Arthritis in her brother; Congestive Heart Failure in her sister; Diabetes in her sister; Hypertension in her mother and sister; Lung cancer in her father and mother.  Current Medications  Current Facility-Administered Medications:    acetaminophen (TYLENOL) tablet 325-650 mg, 325-650 mg, Oral, Q4H PRN, Pixie Casino, MD, 650 mg at 12/02/22 1723   alum & mag hydroxide-simeth (MAALOX/MYLANTA) 200-200-20 MG/5ML suspension 30 mL, 30 mL, Oral, Q4H PRN, Hilty, Nadean Corwin, MD   amiodarone (PACERONE) tablet 200 mg, 200 mg, Oral, Daily, Hilty, Nadean Corwin, MD, 200 mg at 12/07/22 6767   apixaban (ELIQUIS) tablet 5 mg, 5 mg, Oral, BID, Hilty, Nadean Corwin, MD, 5 mg at 12/07/22 2094   bisacodyl (DULCOLAX) suppository 10 mg, 10 mg, Rectal, Daily PRN **OR** sorbitol 70 % solution 60 mL, 60 mL, Oral, Daily PRN, Pixie Casino, MD   calcium carbonate (TUMS - dosed in mg elemental calcium) chewable tablet 200 mg of elemental calcium, 1 tablet, Oral, TID PRN, Hilty, Nadean Corwin, MD   camphor-menthol Timoteo Ace) lotion, , Topical, PRN, Pixie Casino, MD   cyanocobalamin (VITAMIN B12) tablet 1,000 mcg, 1,000 mcg, Oral, Daily, Hilty, Nadean Corwin, MD, 1,000 mcg at 12/07/22 0939   famotidine (PEPCID) tablet 20 mg, 20 mg, Oral, Daily, Hilty, Nadean Corwin, MD, 20 mg at 12/07/22 7096   feeding supplement (ENSURE ENLIVE / ENSURE PLUS) liquid 237 mL, 237 mL,  Oral, TID BM, Hilty, Nadean Corwin, MD, 237 mL at 12/07/22 1420   Gerhardt's butt cream, , Topical, BID, Hilty, Nadean Corwin, MD, 1 Application at 28/36/62 0940   guaiFENesin-dextromethorphan (ROBITUSSIN DM) 100-10 MG/5ML syrup 5-10 mL, 5-10 mL, Oral, Q6H PRN, Hilty, Nadean Corwin, MD   ipratropium-albuterol (DUONEB) 0.5-2.5 (3) MG/3ML nebulizer solution 3 mL, 3 mL, Nebulization, Q4H PRN, Pixie Casino, MD   levothyroxine (SYNTHROID) tablet 100 mcg, 100 mcg, Oral, Q0600, Pixie Casino, MD, 100 mcg at 12/07/22 0526   melatonin tablet 5 mg, 5 mg, Oral, QHS PRN, Pixie Casino, MD, 5 mg at 12/06/22 2024   midodrine (PROAMATINE) tablet 10 mg, 10 mg, Oral, TID WC, Elodia Florence., MD, 10 mg at 12/07/22 1620   ondansetron (ZOFRAN) injection 4 mg, 4 mg, Intravenous, Q6H PRN, Pixie Casino, MD, 4 mg at 12/05/22 1700   Oral care mouth rinse, 15 mL, Mouth Rinse, 4 times  per day, Pixie Casino, MD, 15 mL at 12/07/22 1620   Oral care mouth rinse, 15 mL, Mouth Rinse, PRN, Hilty, Nadean Corwin, MD   prochlorperazine (COMPAZINE) tablet 5-10 mg, 5-10 mg, Oral, Q6H PRN, 5 mg at 11/30/22 0427 **OR** prochlorperazine (COMPAZINE) injection 5-10 mg, 5-10 mg, Intramuscular, Q6H PRN, 10 mg at 12/02/22 1855 **OR** prochlorperazine (COMPAZINE) suppository 12.5 mg, 12.5 mg, Rectal, Q6H PRN, Pixie Casino, MD   rosuvastatin (CRESTOR) tablet 10 mg, 10 mg, Oral, Daily, Hilty, Nadean Corwin, MD, 10 mg at 12/07/22 1275   senna-docusate (Senokot-S) tablet 2 tablet, 2 tablet, Oral, BID, Pixie Casino, MD, 2 tablet at 12/07/22 1700   sodium phosphate (FLEET) 7-19 GM/118ML enema 1 enema, 1 enema, Rectal, Once PRN, Hilty, Nadean Corwin, MD  Patients Current Diet:  Diet Order             DIET DYS 3 Room service appropriate? Yes; Fluid consistency: Thin  Diet effective now                  Precautions / Restrictions Precautions Precautions: Fall, Other (comment) Precaution Comments: orthostatic, teds, ace wraps and ABD  binder Restrictions Weight Bearing Restrictions: No Other Position/Activity Restrictions: TED hose, abdominal binder, ace wraps   Has the patient had 2 or more falls or a fall with injury in the past year? No  Prior Activity Level Limited Community (1-2x/wk): Mod I  Prior Functional Level Self Care: Did the patient need help bathing, dressing, using the toilet or eating? Independent  Indoor Mobility: Did the patient need assistance with walking from room to room (with or without device)? Independent  Stairs: Did the patient need assistance with internal or external stairs (with or without device)? Independent  Functional Cognition: Did the patient need help planning regular tasks such as shopping or remembering to take medications? Independent  Patient Information Are you of Hispanic, Latino/a,or Spanish origin?: A. No, not of Hispanic, Latino/a, or Spanish origin What is your race?: A. White Do you need or want an interpreter to communicate with a doctor or health care staff?: 9. Unable to respond  Patient's Response To:  Health Literacy and Transportation Is the patient able to respond to health literacy and transportation needs?: No Health Literacy - How often do you need to have someone help you when you read instructions, pamphlets, or other written material from your doctor or pharmacy?: Patient unable to respond In the past 12 months, has lack of transportation kept you from medical appointments or from getting medications?: No In the past 12 months, has lack of transportation kept you from meetings, work, or from getting things needed for daily living?: No  Development worker, international aid / [employer] Devices/Equipment: None Home Equipment: Conservation officer, nature (2 wheels), [employer] - single point, Control and instrumentation engineer Use: Indicate devices/aids used by the patient prior to current illness, exacerbation or injury? Walker  Current Functional Level Cognition   Arousal/Alertness: Awake/alert Overall Cognitive Status: No family/caregiver present to determine baseline cognitive functioning Current Attention Level: Selective Orientation Level: Oriented to situation, Oriented to person Following Commands: Follows one step commands consistently Safety/Judgement: Decreased awareness of deficits, Decreased awareness of safety General Comments: highly distractable, poor attention. needs a non-distracting, clear environment and maximal verbal cues. Pt perseverating on having "crumbs" in her teeth throughout sesssion despite thorough oral care Attention: Focused, Sustained Focused Attention: Appears intact Sustained Attention: Impaired Memory: Impaired Memory Impairment: Storage deficit, Retrieval deficit, Decreased recall of new information Awareness:  Impaired Awareness Impairment: Intellectual impairment, Emergent impairment Problem Solving: Impaired Problem Solving Impairment: Functional basic, Verbal basic Executive Function: Reasoning, Sequencing, Organizing, Decision Making, Self Monitoring, Self Correcting Reasoning: Impaired Reasoning Impairment: Verbal basic, Functional basic Sequencing: Impaired Sequencing Impairment: Verbal basic, Functional basic Organizing: Impaired Organizing Impairment: Verbal basic, Functional basic Decision Making: Impaired Decision Making Impairment: Verbal basic, Functional basic Initiating: Impaired Initiating Impairment: Verbal basic, Functional basic Self Monitoring: Impaired Self Monitoring Impairment: Verbal basic, Functional basic Self Correcting: Impaired Self Correcting Impairment: Verbal basic, Functional basic Behaviors: Perseveration Safety/Judgment: Impaired    Extremity Assessment (includes Sensation/Coordination)  Upper Extremity Assessment: LUE deficits/detail LUE Deficits / Details: using to complete oral hygiene LUE Sensation: decreased light touch, decreased proprioception LUE Coordination:  decreased fine motor, decreased gross motor  Lower Extremity Assessment: Defer to PT evaluation    ADLs  Overall ADL's : Needs assistance/impaired Eating/Feeding: Set up Eating/Feeding Details (indicate cue type and reason): eating with HOB 30* upon arrival Grooming: Oral care, Maximal assistance, Sitting Upper Body Bathing: Minimal assistance, Sitting, Bed level Lower Body Bathing: Moderate assistance, Sit to/from stand Upper Body Dressing : Minimal assistance, Sitting Lower Body Dressing: Sitting/lateral leans, Maximal assistance Lower Body Dressing Details (indicate cue type and reason): donning socks and ted hose Toilet Transfer: Minimal assistance, Stand-pivot, Rolling walker (2 wheels) Toilet Transfer Details (indicate cue type and reason): simulated to recliner Toileting- Clothing Manipulation and Hygiene: Moderate assistance, Bed level Toileting - Clothing Manipulation Details (indicate cue type and reason): bridging in supine to assist with doffing mesh underwear Functional mobility during ADLs: Minimal assistance, Cueing for safety, Cueing for sequencing, Rolling walker (2 wheels) General ADL Comments: significantly increased time and cues needed for all funcitonal tasks    Mobility  Overal bed mobility: Needs Assistance Bed Mobility: Supine to Sit, Sit to Supine Rolling: Supervision Supine to sit: Min assist Sit to supine: Min assist General bed mobility comments: assistance for LE support to return to bed. cues for technique    Transfers  Overall transfer level: Needs assistance Equipment used: Rolling walker (2 wheels) Transfers: Sit to/from Stand Sit to Stand: Min assist, Mod assist Bed to/from chair/wheelchair/BSC transfer type:: Step pivot Step pivot transfers: Min assist General transfer comment: abdominal binder donned in sitting prior to standing. patient declined ace wraps today due to discomfort previously. multiple standing bouts performed varying from Min A  to Mod A. verbal cues for hand placement for safety and  faciliation for anterior weight shifting    Ambulation / Gait / Stairs / Wheelchair Mobility  Ambulation/Gait Ambulation/Gait assistance: Herbalist (Feet): 15 Feet Assistive device: Rolling walker (2 wheels) Gait Pattern/deviations: Step-through pattern General Gait Details: verbal cues for rolling walker negotation and occasional steadying assistance provided. further ambulation deferred due to blood pressure in standing (79/60), although patient reports no dizziness with standing activity Gait velocity: decreased Gait velocity interpretation: <1.31 ft/sec, indicative of household ambulator    Posture / Balance Dynamic Sitting Balance Sitting balance - Comments: initially poor Balance Overall balance assessment: Needs assistance Sitting-balance support: Feet supported Sitting balance-Leahy Scale: Fair Sitting balance - Comments: initially poor Standing balance support: Bilateral upper extremity supported Standing balance-Leahy Scale: Poor Standing balance comment: external support required. standing tolerance of 1.5 minutes performed x 3 bouts    Special needs/care consideration Fall precautions due to decreased safety awareness TED/wraps and abdominal binder for ortho stasis   Previous Home Environment  Living Arrangements: Alone  Lives With: Alone Available Help at Discharge: Family, Available 24  hours/day Type of Home: House Home Layout: Two level, Able to live on main level with bedroom/bathroom Alternate Level Stairs-Number of Steps: 4 steps down to main living room w/ rail Home Access: Stairs to enter Entrance Stairs-Rails: Right, Left Entrance Stairs-Number of Steps: 2 Bathroom Shower/Tub: Multimedia programmer: Standard Bathroom Accessibility: Yes How Accessible: Accessible via walker Home Care Services: No Additional Comments: Daughter to arrange 24/7 supervision; she is here with her  spouse from [facility]  Discharge Living Setting Plans for Discharge Living Setting: Patient's home, House Type of Home at Discharge: House Discharge Home Layout: Two level, Able to live on main level with bedroom/bathroom Alternate Level Stairs-Rails:  (4 steps down to main living room) Discharge Home Access: Stairs to enter Entrance Stairs-Rails: Right, Left Entrance Stairs-Number of Steps: 2 Discharge Bathroom Shower/Tub: Walk-in shower Discharge Bathroom Toilet: Standard Discharge Bathroom Accessibility: Yes How Accessible: Accessible via walker Does the patient have any problems obtaining your medications?: No  Social/Family/Support Systems Patient Roles: Parent Contact Information: daughter, Pam Anticipated Caregiver: Daughter pam, SIL and hired Building control surveyor Information: see contacts Ability/Limitations of Caregiver: DTr here from [place] Availability: 24/7 Discharge Plan Discussed with Primary Caregiver: Yes Is Caregiver In Agreement with Plan?: Yes Does Caregiver/Family have Issues with Lodging/Transportation while Pt is in Rehab?: No  Goals Patient/Family Goal for Rehab: supervision with PT, OT and SLP Expected length of stay: ELOS 5 to 7 days Pt/Family Agrees to Admission and willing to participate: Yes Program Orientation Provided & Reviewed with Pt/Caregiver Including Roles  & Responsibilities: Yes  Decrease burden of Care through IP rehab admission: n/a  Possible need for SNF placement upon discharge: not anticipated  Patient Condition: I have reviewed medical records from [facility], spoken with CM, and patient and daughter. I met with patient at the bedside for inpatient rehabilitation assessment.  Patient will benefit from ongoing PT, OT, and SLP, can actively participate in 3 hours of therapy a day 5 days of the week, and can make measurable gains during the admission.  Patient will also benefit from the coordinated team  approach during an Inpatient Acute Rehabilitation admission.  The patient will receive intensive therapy as well as Rehabilitation physician, nursing, social worker, and care management interventions.  Due to bladder management, bowel management, safety, skin/wound care, disease management, medication administration, pain management, and patient education the patient requires 24 hour a day rehabilitation nursing.  The patient is currently *** with mobility and basic ADLs.  Discharge setting and therapy post discharge at home with home health is anticipated.  Patient has agreed to participate in the Acute Inpatient Rehabilitation Program and will admit today.  Preadmission Screen Completed [id number] Olina Melfi RN MSN with updates by Julious Payer, Audelia Acton, 12/07/2022 4:55 PM ______________________________________________________________________   Discussed status with Dr. Marland Kitchen on *** at *** and received approval for admission today.  Admission Coordinator:  Danne Baxter RN MSN with updates by Julious Payer, Audelia Acton, RN, time Marland KitchenSudie Grumbling ***   Assessment/Plan: Diagnosis: Does the need for close, 24 hr/day Medical supervision in concert with the patient's rehab needs make it unreasonable for this patient to be served in a less intensive setting? {yes_no_potentially:3041433} Co-Morbidities requiring supervision/potential complications: *** Due to {due SL:3734287}, does the patient require 24 hr/day rehab nursing? {yes_no_potentially:3041433} Does the patient require coordinated care of a physician, rehab nurse, PT, OT, and SLP to address physical and functional deficits in the context of the above medical diagnosis(es)? {yes_no_potentially:3041433} Addressing deficits in the following areas: {  deficits:3041436} Can the patient actively participate in an intensive therapy program of at least 3 hrs of therapy 5 days a week? {yes_no_potentially:3041433} The potential for patient to make measurable  gains while on inpatient rehab is {potential:3041437} Anticipated functional outcomes upon discharge from inpatient rehab: {functional outcomes:304600100} PT, {functional outcomes:304600100} OT, {functional outcomes:304600100} SLP Estimated rehab length of stay to reach the above functional goals is: *** Anticipated discharge destination: {anticipated dc setting:21604} 10. Overall Rehab/Functional Prognosis: {potential:3041437}   MD Signature: ***

## 2022-11-28 NOTE — Significant Event (Addendum)
Rapid Response Event Note   Reason for Call :  Episode of decreased responsiveness after getting up to [facility].  Per RN, pt had a brief moment of nausea, then unresponsiveness just after getting up to the [facility]. At that time, pt had a blank stare on her face and became cool/clammy. Pt was placed back in the bed. She then started responding. SBP-100s once back in bed  Initial Focused Assessment:  Pt lying in bed with eyes open, in no visible distress. Pt is alert and oriented. She denies CP/SOB and says she remembers what happened on the [facility]. Pupils 4, equal, and reactive. NIH-1 for sensory(though this is inconsistent). Skin warm and dry.  HR-120-130s(Afib), BP-115/70, RR-18, SpO2-100% on RA  EKG strip reviewed, pt HR increased to 150s at the time of the event. No bradycardia or pauses seen.  Interventions:  CBG-143 Plan of Care:  ?Orthostatic hypotension. Pt back to baseline at this time. Continue to monitor pt. Call RRT if further assistance needed.   Event Summary:   MD Notified: Dr. Marlowe Sax notified by bedside RN Call [place], Meshelle Holness Anderson, RN

## 2022-11-28 NOTE — Progress Notes (Signed)
PT Cancellation Note  Patient Details Name: Leslie [name] MRN: [id number] DOB: 1941-02-28   Cancelled Treatment:    Reason Eval/Treat Not Completed: Medical issues which prohibited therapy (Pt stated that she felt like she was smothering and HR 147 bpm. Will wait at this time and continued to attempt as able and appropriate.)  Tomma Rakers, DPT, [place] Office: [phone] (Secure chat preferred)    Ander Purpura 11/28/2022, 3:25 PM

## 2022-11-28 NOTE — Progress Notes (Signed)
Developed recurrent Afib with RVR this afternoon. Amiodarone bolus and gtt restarted. Will defer TTE until we are able to get HR better controlled.  If difficult with rate control, can have EP evaluate as well. 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  Gwyndolyn Kaufman, MD

## 2022-11-28 NOTE — Progress Notes (Signed)
[facility] PROGRESS NOTE  Leslie [name] (DOB: 1941/07/26) ZOX:[id number] PCP: Sonia Side., FNP  Brief Narrative: Leslie [name] is an 81 y.o. female with a history of hypothyroidism, GERD who presented to the ED on 10/29/2022 from PCP's office after finding new AFib with RVR. There was noted to have pneumonia with right pleural effusion treated with antibiotics and thoracentesis (transudate, negative Cx and cytology). Cardiology was consulted, eliquis started, and ultimately got rates under adequate control. On 11/25 she developed right gaze deviation, left hemiparesis and neglect due to right M1 occlusion, taken emergently for mechanical thrombectomy which restored TICI 3 flow. She stabilized, ultimately transferring to CIR 11/30. While undergoing therapy, diltiazem dose was decreased due to orthostatic hypotension. She went recurrently into rapid atrial fibrillation with hypotension prompting readmission on 12/15. Subsequently underwent TEE/DCCV 12/18. Plan was to transfer back to CIR, though she's recurrently in rapid atrial fibrillation again 12/20. Amiodarone gtt initiated. Cardiology managing, perhaps consulting EP.  Subjective: No complaints this morning.   Objective: BP (!) 117/93   Pulse (!) 137   Temp 98.1 F (36.7 C) (Oral)   Resp 20   Wt 66.7 kg   SpO2 100%   BMI 22.68 kg/m   Gen: Nontoxic elderly female WDWN in no distress Pulm: Clear, nonlabored  CV: RRR, no MRG or JVD this AM. Rapid AFlutter on ECG this PM. GI: Soft, NT, ND, +BS Neuro: Alert and oriented. No new focal deficits. Ext: Warm, no deformities Skin: No new rashes, lesions or ulcers on visualized skin   Assessment & Plan: Paroxysmal atrial fibrillation with RVR: s/p TEE/DCCV 12/18, reverted NSR > AFib 12/20. Remains afebrile without leukocytosis or symptoms of infection. No anemia/evidence of bleeding or hypovolemia. No anginal complaints.  - Restart amiodarone load and infusion.  -  Cardiology assistance appreciated - Recheck K, Mg. Goal potassium at least 4 and magnesium at least 2 -Continue Eliquis   Delirium: Improving. Ammonia noted to be 36 (ULN is 35). No asterixis on exam currently, pt not lethargic and AMS has waxed/waned, currently much improved. No urinary symptoms nor pyuria/bacteriuria. No new CXR infiltrate. Will not initiate lactulose at this time.    Orthostatic Hypotension  Hypotension: Overall improved.  - Continue new start low dose midodrine - Continue TED hose/abdominal binder.     HFrEF, tachycardia-mediated cardiomyopathy, severe MR: LVEF 40-45% with global hypokinesis.  - Monitor I/O, weights, BMP.  - Repeat echo pending once AFib under control.  - Consider ischemic evaluation.     Vitamin B12 deficiency: B12 low normal. MMA elevated. 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  - Continue anticoagulation, statin.  - Continue PT/OT/SLP once hemodynamically stable. Plan is to return to CIR prior to home, though disposition will be delayed today.    COPD: Patient without acute exacerbation.   Hypothyroidism: TSH 11.301.   -Continue levothyroxine (increased dose)   [name], MD [facility] [email or web address] 11/28/2022, 5:20 PM

## 2022-11-28 NOTE — Progress Notes (Signed)
Rounding Note    Patient Name: Leslie [name] Date of Encounter: 11/28/2022  [place] Cardiologist: Peter Martinique, MD   Subjective   Developed recurrent Afib with RVR and restarted on amiodarone gtt. HR elevated 100-140s  Patient had episode of decreased responsiveness last night. CT head with new hypodense region in the insula on the right concerning for acute or subactue infract. Neuro has been consulted.  This morning, patient was more alert and sitting in a chair. Answering questions appropriately. Manual blood pressure low with SBP 83.   Inpatient Medications    Scheduled Meds:  apixaban  5 mg Oral BID   vitamin B-12  1,000 mcg Oral Daily   famotidine  20 mg Oral Daily   feeding supplement  237 mL Oral TID BM   Gerhardt's butt cream   Topical BID   levothyroxine  100 mcg Oral Q0600   midodrine  2.5 mg Oral TID WC   mouth rinse  15 mL Mouth Rinse 4 times per day   rosuvastatin  10 mg Oral Daily   senna-docusate  2 tablet Oral BID   Continuous Infusions:  amiodarone 60 mg/hr (11/28/22 1659)   Followed by   amiodarone      PRN Meds: acetaminophen, alum & mag hydroxide-simeth, bisacodyl **OR** sorbitol, calcium carbonate, camphor-menthol, guaiFENesin-dextromethorphan, ipratropium-albuterol, melatonin, mouth rinse, prochlorperazine **OR** prochlorperazine **OR** prochlorperazine, sodium phosphate   Vital Signs    Vitals:   11/28/22 0801 11/28/22 1100 11/28/22 1545 11/28/22 1645  BP: 124/81 136/88 (!) 131/93 (!) 117/93  Pulse: 90 68 (!) 137   Resp: '16 15 18 20  '$ Temp: 98.1 F (36.7 C) 98.1 F (36.7 C) 98.1 F (36.7 C)   TempSrc: Oral Oral Oral   SpO2: 99% 100% 99% 100%  Weight:        Intake/Output Summary (Last 24 hours) at 11/28/2022 1732 Last data filed at 11/28/2022 1700 Gross per 24 hour  Intake 480 ml  Output 900 ml  Net -420 ml       11/28/2022    4:39 AM 11/27/2022    4:45 AM 11/26/2022    3:38 AM  Last 3 Weights   Weight (lbs) 147 lb 149 lb 14.6 oz 141 lb 9.6 oz  Weight (kg) 66.679 kg 68 kg 64.229 kg      Telemetry    Afib with HR 100-140s- Personally Reviewed  ECG    Afib with RVR with HR 121- Personally Reviewed  Physical Exam   GEN: Sitting comfortably in a chair. Alert and answering questions appropriately Neck: No JVD Cardiac: Irregularly irregular, no murmurs Respiratory: CTAB GI: Soft, nontender, non-distended  MS: No edema; No deformity. Neuro:  Nonfocal  Psych: Normal affect   Labs    High Sensitivity Troponin:   Recent Labs  Lab 10/29/22 2018 10/31/22 1540 10/31/22 1736  TROPONINIHS '11 12 14      '$ Chemistry Recent Labs  Lab 11/23/22 0842 11/23/22 2043 11/25/22 0130 11/26/22 0608  NA 138  --  141 139  K 3.8  --  3.8 3.5  CL 103  --  107 105  CO2 27  --  28 26  GLUCOSE 134*  --  109* 105*  BUN 19  --  12 10  CREATININE 1.36*  --  1.26* 1.25*  CALCIUM 9.8  --  9.5 9.5  MG  --  2.1  --   --   GFRNONAA 39*  --  43* 44*  ANIONGAP 8  --  6 8     Lipids No results for input(s): "CHOL", "TRIG", "HDL", "LABVLDL", "LDLCALC", "CHOLHDL" in the last 168 hours.  Hematology Recent Labs  Lab 11/25/22 0130 11/26/22 0608  WBC 9.9 10.0  RBC 4.41 4.51  HGB 12.2 12.8  HCT 38.8 39.1  MCV 88.0 86.7  MCH 27.7 28.4  MCHC 31.4 32.7  RDW 15.6* 15.7*  PLT 219 208    Thyroid  Recent Labs  Lab 11/25/22 0130  TSH 11.301*  FREET4 1.06     BNPNo results for input(s): "BNP", "PROBNP" in the last 168 hours.  DDimer No results for input(s): "DDIMER" in the last 168 hours.   Radiology    ECHOCARDIOGRAM COMPLETE  Result Date: 11/28/2022    ECHOCARDIOGRAM REPORT   Patient Name:   [name] Date of Exam: 11/28/2022 Medical Rec #:  [id number]           Height:       67.5 in Accession #:    [id number]          Weight:       147.0 lb Date of Birth:  1941-05-20           BSA:          1.784 m Patient Age:    70 years            BP:           136/88 mmHg Patient  Gender: F                   HR:           145 bpm. Exam Location:  Inpatient Procedure: 2D Echo, Cardiac Doppler and Color Doppler Indications:    Congestive Heart Failure I50.9                 Mitral valve insufficiency I34.0  History:        Patient has prior history of Echocardiogram examinations, most                 recent 10/30/2022. CHF, COPD and Stroke, Arrythmias:Atrial                 Fibrillation; Signs/Symptoms:Hypotension. Chronic Kidney                 Disease.  Sonographer:    Ronny Flurry Referring Phys: [id number] [place]  1. Limited study with only 45 images, technically difficult  2. Left ventricular ejection fraction, by estimation, is 40 to 45%. The left ventricle has mildly decreased function. The left ventricle demonstrates global hypokinesis. There is mild asymmetric left ventricular hypertrophy. Left ventricular diastolic function could not be evaluated.  3. The mitral valve is abnormal. Mild to moderate mitral valve regurgitation.  4. Rhythm strip during this exam demonstrates atrial fib with rapid ventricular response.  5. The aortic valve is tricuspid. Aortic valve regurgitation is not visualized.  6. Right ventricular systolic function was not well visualized. The right ventricular size is not well visualized. Comparison(s): Changes from prior study are noted. 10/30/2022: LVEF 40-45%, modearate to severe MR. Conclusion(s)/Recommendation(s): Consider repeat limited echo when the HR is lower. FINDINGS  Left Ventricle: Left ventricular ejection fraction, by estimation, is 40 to 45%. The left ventricle has mildly decreased function. The left ventricle demonstrates global hypokinesis. The left ventricular internal cavity size was normal in size. There is  mild asymmetric left ventricular hypertrophy. Left ventricular diastolic function could not be evaluated due  to atrial fibrillation. Left ventricular diastolic function could not be evaluated. Right Ventricle: The  right ventricular size is not well visualized. Right vetricular wall thickness was not assessed. Right ventricular systolic function was not well visualized. Left Atrium: Left atrial size was normal in size. Right Atrium: Right atrial size was normal in size. Pericardium: There is no evidence of pericardial effusion. Mitral Valve: The mitral valve is abnormal. Mild to moderate mitral valve regurgitation, with posteriorly-directed jet. Tricuspid Valve: The tricuspid valve is not well visualized. Tricuspid valve regurgitation is not demonstrated. Aortic Valve: The aortic valve is tricuspid. Aortic valve regurgitation is not visualized. Pulmonic Valve: The pulmonic valve was not well visualized. Pulmonic valve regurgitation is not visualized. Aorta: The aortic root and ascending aorta are structurally normal, with no evidence of dilitation. IAS/Shunts: No atrial level shunt detected by color flow Doppler. EKG: Rhythm strip during this exam demonstrates atrial fib with rapid ventricular response.  LEFT VENTRICLE PLAX 2D LVIDd:         4.00 cm   Diastology LVIDs:         3.20 cm   LV e' lateral: 16.00 cm/s LV PW:         0.90 cm LV IVS:        1.30 cm LVOT diam:     1.90 cm LVOT Area:     2.84 cm  LEFT ATRIUM           Index LA diam:      3.30 cm 1.85 cm/m LA Vol (A4C): 39.9 ml 22.37 ml/m   AORTA Ao Root diam: 2.80 cm Ao Asc diam:  3.10 cm  SHUNTS Systemic Diam: 1.90 cm Lyman Bishop MD Electronically signed by Lyman Bishop MD Signature Date/Time: 11/28/2022/5:07:55 PM    Final    DG CHEST PORT 1 VIEW  Result Date: 11/27/2022 CLINICAL DATA:  Delirium EXAM: PORTABLE CHEST 1 VIEW COMPARISON:  11/20/2022 FINDINGS: Post infectious inflammatory scarring in the right lower lung. Left lung is clear. No pleural effusion or pneumothorax. The heart is normal in size. IMPRESSION: Post infectious inflammatory scarring in the right lower lung. Electronically Signed   By: Julian Hy [name]   On: 11/27/2022 20:05     Cardiac Studies  TEE 11/26/22: IMPRESSIONS     1. Left ventricular ejection fraction, by estimation, is 20%. The left  ventricle has severely decreased function.   2. Right ventricular systolic function is severely reduced. The right  ventricular size is mildly enlarged.   3. Left atrial size was moderately dilated. No left atrial/left atrial  appendage thrombus was detected. The LAA emptying velocity was 35 cm/s.   4. A small pericardial effusion is present.   5. The mitral valve is grossly normal. Severe mitral valve regurgitation,  likely secondary to LV dysfunction and atrial functional MR. Multiple  jets, appears commissural.   6. The aortic valve is grossly normal. Aortic valve regurgitation is  mild.   7. There is mild (Grade II) atheroma plaque involving the aortic arch and  descending aorta.   Conclusion(s)/Recommendation(s): No LA/LAA thrombus identified. Successful  cardioversion performed with restoration of normal sinus rhythm.    Patient Profile     82 y.o. female with a history of paroxysmal atrial fibrillation on Eliquis, chronic systolic CHF with EF of 16-38%, mitral regurgitation, CKD stage III, hypothyroidism, GERD, and multiple CVA with recent stroke on 11/03/2022 s/p thrombectomy. Patient was recently admitted from 10/29/2022 to 11/08/2022 for new onset atrial fibrillation and was found  to have a reduced EF of 40-45%. Hospitalization was complicated by community acquired pneumonia (which was initially diagnosed prior to this admission) with right pleural effusion s/p thoracentesis on 10/30/2022, and acute stroke requiring thrombectomy. She was discharged to CIR on 11/08/2022. She went back into rapid atrial fibrillation with rates in the 150s on 11/23/2022 and was hypotensive with it. Therefore, she was readmitted as an inpatient and Cardiology consulted for assistance.   Assessment & Plan     #Concern for new acute vs subacute insula infarct: #Episode of  decreased responsiveness: Patient had episode of decreased responsiveness with nonsensical speech for a brief period. Underwent CT head which showed new hypodensity in the right insular region. Seen by Neuro and is recommended for MRI brain/MRA head and EEG. More alert on exam for me today. -MRA/MRI head pending -Management per Neuro  #Persistent Atrial Fibrillation:  Patient with recent admission for stroke found to have new onset atrial fibrillation. Echo showed LVEF of 40-45%. She was started on Diltiazem and apixaban. She was discharged to CIR but represented on this admission for recurrent rapid atrial fibrillation with rates in the 150s with associated hypotension for which Cardiology was consulted. Given difficulty with rate control and soft blood pressures limiting nodal agents, she underwent TEE/DCCV on 11/26/22 with initial return to NSR but developed recurrent RVR on 12/20 now back on amiodarone gtt. - S/p successful TEE/DCCV on 11/26/22 but developed recurrent RVR on 12/20 - Re-bolus amiodarone '150mg'$  slowly given soft blood pressure and increase gtt back to 60 given persistent RVR - Will dig load for additional rate control - Cannot tolerate nodal agents due to significant orthostatic hypotension - Continue Eliquis '5mg'$  BID   #Chronic Systolic CHF:  Echo during recent admission showed LVEF of 40-45% with global hypokinesis , moderate asymmetric LVH of the basal-septal segment and moderate to severe MR. TEE on 11/26/22 with interval drop in EF to 20% in the setting of Afib with RVR. Currently euvolemic on exam. On amiodarone gtt for recurrent RVR. -TEE with interval drop in EF to 20% in the setting of Afib with RVR -Will managed Afib as above -Unable to tolerate GDMT due to orthostatic hypotension  -Plan to repeat TTE if able to restore NSR to reassess LVEF   #Hypotension: Patient has had chronic orthostatic hypotension at CIR that acutely worsened on 12/15 in the setting of Afib with  RVR. Dilt stopped and midodrine started. On exam this morning, BP soft in the 80s but she is alert and awake sitting in a chair. Will give IVF now and increase midodrine. -Give 500cc IVF -Increase midodrine to '5mg'$  TID; can uptitrate further if needed -Continue compression socks and abdominal binder   #Severe MR: Severe on TEE. Likely functional. Will plan to repeat once back in NSR or once better rate controlled.  For questions or updates, please contact [place] Please consult [email or web address] for contact info under        Signed, Freada Bergeron, MD  11/28/2022, 5:32 PM

## 2022-11-28 NOTE — Progress Notes (Signed)
Echocardiogram incomplete due to elevated heart rate.

## 2022-11-29 ENCOUNTER — Inpatient Hospital Stay (HOSPITAL_COMMUNITY): Payer: Medicare HMO

## 2022-11-29 ENCOUNTER — Encounter (HOSPITAL_COMMUNITY): Payer: Self-pay | Admitting: Internal Medicine

## 2022-11-29 ENCOUNTER — Other Ambulatory Visit: Payer: Self-pay

## 2022-11-29 DIAGNOSIS — R404 Transient alteration of awareness: Secondary | ICD-10-CM | POA: Diagnosis not present

## 2022-11-29 DIAGNOSIS — R4182 Altered mental status, unspecified: Secondary | ICD-10-CM

## 2022-11-29 DIAGNOSIS — I480 Paroxysmal atrial fibrillation: Principal | ICD-10-CM | POA: Diagnosis present

## 2022-11-29 DIAGNOSIS — I639 Cerebral infarction, unspecified: Secondary | ICD-10-CM

## 2022-11-29 DIAGNOSIS — L8990 Pressure ulcer of unspecified site, unspecified stage: Secondary | ICD-10-CM | POA: Insufficient documentation

## 2022-11-29 LAB — TROPONIN I (HIGH SENSITIVITY)
Troponin I (High Sensitivity): 8 ng/L (ref <18)
Troponin I (High Sensitivity): 8 ng/L (ref <18)

## 2022-11-29 LAB — CBC
HCT: 40.9 % (ref 36.0–46.0)
Hemoglobin: 13 g/dL (ref 12.0–15.0)
MCH: 27.7 pg (ref 26.0–34.0)
MCHC: 31.8 g/dL (ref 30.0–36.0)
MCV: 87.2 fL (ref 80.0–100.0)
Platelets: 204 10*3/uL (ref 150–400)
RBC: 4.69 MIL/uL (ref 3.87–5.11)
RDW: 15.8 % — ABNORMAL HIGH (ref 11.5–15.5)
WBC: 10.8 10*3/uL — ABNORMAL HIGH (ref 4.0–10.5)
nRBC: 0 % (ref 0.0–0.2)

## 2022-11-29 LAB — BASIC METABOLIC PANEL
Anion gap: 8 (ref 5–15)
BUN: 9 mg/dL (ref 8–23)
CO2: 23 mmol/L (ref 22–32)
Calcium: 9.6 mg/dL (ref 8.9–10.3)
Chloride: 108 mmol/L (ref 98–111)
Creatinine, Ser: 1.15 mg/dL — ABNORMAL HIGH (ref 0.44–1.00)
GFR, Estimated: 48 mL/min — ABNORMAL LOW (ref >60)
Glucose, Bld: 118 mg/dL — ABNORMAL HIGH (ref 70–99)
Potassium: 3.4 mmol/L — ABNORMAL LOW (ref 3.5–5.1)
Sodium: 139 mmol/L (ref 135–145)

## 2022-11-29 LAB — MAGNESIUM: Magnesium: 2 mg/dL (ref 1.7–2.4)

## 2022-11-29 MED ORDER — DIGOXIN 0.25 MG/ML IJ SOLN
0.1250 mg | Freq: Once | INTRAMUSCULAR | Status: AC
  Administered 2022-11-30: 0.125 mg via INTRAVENOUS
  Filled 2022-11-29 (×2): qty 0.5

## 2022-11-29 MED ORDER — DIGOXIN 0.25 MG/ML IJ SOLN
0.1250 mg | Freq: Once | INTRAMUSCULAR | Status: AC
  Administered 2022-11-29: 0.125 mg via INTRAVENOUS
  Filled 2022-11-29: qty 0.5

## 2022-11-29 MED ORDER — SODIUM CHLORIDE 0.9 % IV BOLUS
500.0000 mL | Freq: Once | INTRAVENOUS | Status: AC
  Administered 2022-11-29: 500 mL via INTRAVENOUS

## 2022-11-29 MED ORDER — AMIODARONE IV BOLUS ONLY 150 MG/100ML
150.0000 mg | Freq: Once | INTRAVENOUS | Status: AC
  Administered 2022-11-29: 150 mg via INTRAVENOUS
  Filled 2022-11-29: qty 100

## 2022-11-29 MED ORDER — DIGOXIN 0.25 MG/ML IJ SOLN
0.2500 mg | Freq: Once | INTRAMUSCULAR | Status: AC
  Administered 2022-11-29: 0.25 mg via INTRAVENOUS
  Filled 2022-11-29 (×2): qty 1

## 2022-11-29 MED ORDER — MIDODRINE HCL 5 MG PO TABS
2.5000 mg | ORAL_TABLET | Freq: Once | ORAL | Status: AC
  Administered 2022-11-29: 2.5 mg via ORAL
  Filled 2022-11-29: qty 1

## 2022-11-29 MED ORDER — POTASSIUM CHLORIDE CRYS ER 20 MEQ PO TBCR
40.0000 meq | EXTENDED_RELEASE_TABLET | Freq: Once | ORAL | Status: AC
  Administered 2022-11-29: 40 meq via ORAL
  Filled 2022-11-29: qty 2

## 2022-11-29 MED ORDER — MIDODRINE HCL 5 MG PO TABS
5.0000 mg | ORAL_TABLET | Freq: Three times a day (TID) | ORAL | Status: DC
  Administered 2022-11-29 – 2022-11-30 (×5): 5 mg via ORAL
  Filled 2022-11-29 (×5): qty 1

## 2022-11-29 NOTE — TOC Initial Note (Addendum)
Transition of Care [facility]) - Initial/Assessment Note    Patient Details  Name: Leslie [name] MRN: [id number] Date of Birth: 08/18/1941  [facility]) CM/SW Contact:    Bethena Roys, RN Phone Number: 11/29/2022, 12:04 PM  Clinical Narrative:  Patient presented from CIR for hypotension and atrial flutter. Patient continues on IV Amiodarone. Inpatient Rehab Admissions Coordinator is following the patient for possible readmission. Case Manager will continue to follow for transition of care needs.                Planned Disposition: Inpatient Rehab Barriers to Discharge: Continued Medical Work up  Expected Discharge Plan and Services Planned Disposition: Inpatient Rehab In-house Referral: NA Discharge Planning Services: CM Consult Post Acute Care Choice: IP Rehab Living arrangements for the past 2 months: Single Family Home (previous from CIR.)  Prior Living Arrangements/Services Living arrangements for the past 2 months: Single Family Home (previous from CIR.) Lives with:: Self    Psych Involvement: No (comment)  Admission diagnosis:  Paroxysmal A-fib ([place]) [I48.0] Patient Active Problem List   Diagnosis Date Noted   Paroxysmal A-fib ([place]) 11/29/2022   Pressure injury of skin 11/29/2022   Cerebrovascular accident (CVA) ([place]) 11/29/2022   Protein-calorie malnutrition, severe 11/23/2022   Hypotension 11/23/2022   CKD (chronic kidney disease) 11/23/2022   History of CVA with residual deficit 11/23/2022   Heart failure with reduced ejection fraction ([place]) 11/23/2022   Acute on chronic systolic CHF (congestive heart failure) ([place]) 11/07/2022   COPD (chronic obstructive pulmonary disease) ([place]) 11/07/2022   Hypothyroidism 11/07/2022   Middle cerebral artery embolism, right 11/03/2022   Community acquired pneumonia of left lower lobe of lung 10/30/2022   Pleural effusion 10/30/2022   Atrial fibrillation with rapid ventricular response ([place]) 10/29/2022    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 [place] [address] Phone: [phone] Fax: [phone]  Social Determinants of Health (SDOH) Social History: [place]: No Food Insecurity (11/04/2022)  Housing: Low Risk  (11/04/2022)  Transportation Needs: No Transportation Needs (11/04/2022)  Utilities: Not At Risk (11/04/2022)  Tobacco Use: Medium Risk (11/29/2022)   Readmission Risk Interventions     No data to display

## 2022-11-29 NOTE — Progress Notes (Signed)
Patient's BP 77/56, HR 126 while up in chair.  Dr. Johney Frame here and IV bolus NS 500cc ordered.  Patient returned to bed, IV amiodarone bolus given and fluid bolus infusing.  Repeat BP 96/77, 112.  Will continue to monitor.

## 2022-11-29 NOTE — Progress Notes (Signed)
Inpatient Rehabilitation Admissions Coordinator   I am following her medical workup for possible CIR readmit when appropriate pending insurance approval.  Danne Baxter, RN, MSN Rehab Admissions Coordinator [phone] 11/29/2022 4:27 PM

## 2022-11-29 NOTE — Progress Notes (Signed)
CT head showing: "IMPRESSION: 1. New hypodense region in the insula on the right, concerning for acute or subacute infarct. CTA head and/or MRI is recommended for further evaluation. 2. Stable hypodensity in the temporal lobe on the right, compatible with known infarct. 3. Atrophy with extensive chronic microvascular ischemic changes."  -Discussed with Dr. Leonel Ramsay.  He will see the patient and give recommendations.  Appreciate help.

## 2022-11-29 NOTE — Progress Notes (Signed)
EEG complete - results pending 

## 2022-11-29 NOTE — Progress Notes (Signed)
Patient was assisted by nurse tech and another staff member to [facility]. Nurse tech remained at bedside. Nurse tech and daughter reported, patient began to babble unintelligible words with waving in the air arm movement. Nurse tech became concerned. The tech took a blood pressure  and called the nurse . Upon entry into the room, patient HR was 150 and immediately went down to 136. Patient was on the [facility]. Patient was asked to state her name and date of birth. Patient was staring out in space. Patient was given a sternal rub. Patient did not respond and was cool and clammy. Patient was a two assist back to bed. Patient was able to state her name and date of birth after getting in bed. Patient' vitals were taken, NIH was done and blood sugar was taken. Attempted to contact Rath[place] MD via call page at [phone]. Mindy RN with rapid contacted at [phone]. Rapid response enroute to bedside. Patient is AxO x4 Afib RVR (at baseline all night) stable in bed with daughter at bedside.

## 2022-11-29 NOTE — Progress Notes (Signed)
Subjective: Was called back due to the new finding on CT.  The CT was performed because when the patient was sitting on the toilet earlier this evening, she had a transient episode of decreased responsiveness.  Her daughter said that her arms came out but only stiffened up for a second, then the patient was saying nonsensical things for a brief period, she estimates a few seconds.  She never actually lost consciousness.  Reviewing her telemetry, she did not have any alerts during this timeframe.  Exam: Vitals:   11/29/22 0024 11/29/22 0424  BP:  (!) 115/93  Pulse:  (!) 117  Resp: 16 18  Temp:  97.6 F (36.4 C)  SpO2:  100%   Gen: In bed, NAD Resp: non-labored breathing, no acute distress Abd: soft, nt  Neuro: MS: Awake, alert, oriented CN: Visual fields full, EOMI, mild left facial weakness, symmetric facial sensation Motor: She is able to hold all extremities aloft without drift, but confrontation has mild left upper extremity weakness Sensory: She reports decrease sensation of the left arm compared to the right, symmetric in the legs  CT head reviewed-insular hypodensity  Impression: 81 year old female with recent thrombectomy of the right M1 who has a developing hypodensity in the right insular region.  This was not present on her post thrombectomy MRI.  She has been atrial fibrillation, and is possible that she reembolized.  I doubt it was related to her episode earlier.  She will need repeat imaging with MRI and MR angiogram, and I will also include an EEG given the transient episode earlier tonight.  Is possible that this was a vagal episode, just without her heart rate dropping below 50 but with hypoperfusion resulting in the symptoms described.  Seizure would be another possibility, but do not feel I would start antiepileptics with the semiology when she had an abnormal EEG.  Recommendations: 1) MRI brain, MRA head 2) EEG 3) stroke team to follow  Roland Rack,  MD [facility] [phone]  If 7pm- 7am, please page neurology on call as listed in [place].

## 2022-11-29 NOTE — Progress Notes (Addendum)
STROKE TEAM PROGRESS NOTE   INTERVAL HISTORY No family is at the bedside.  Patient remembers entire account of what happened yesterday, and she states that it started when she got frustrated.  Of note she has been hypotensive intermittently MRI brain shows no acute infarct.  MR angiogram of the brain shows no large vessel stenosis or occlusion. Vitals:   11/29/22 0023 11/29/22 0024 11/29/22 0424 11/29/22 0814  BP:   (!) 115/93 94/83  Pulse:   (!) 117 (!) 108  Resp: '15 16 18 18  '$ Temp:   97.6 F (36.4 C) 98 F (36.7 C)  TempSrc:   Axillary Oral  SpO2:   100% 99%  Weight:   66.2 kg    CBC:  Recent Labs  Lab 11/26/22 0608 11/29/22 0241  WBC 10.0 10.8*  HGB 12.8 13.0  HCT 39.1 40.9  MCV 86.7 87.2  PLT 208 952   Basic Metabolic Panel:  Recent Labs  Lab 11/23/22 2043 11/25/22 0130 11/26/22 0608 11/29/22 0241  NA  --    < > 139 139  K  --    < > 3.5 3.4*  CL  --    < > 105 108  CO2  --    < > 26 23  GLUCOSE  --    < > 105* 118*  BUN  --    < > 10 9  CREATININE  --    < > 1.25* 1.15*  CALCIUM  --    < > 9.5 9.6  MG 2.1  --   --  2.0   < > = values in this interval not displayed.   Lipid Panel: No results for input(s): "CHOL", "TRIG", "HDL", "CHOLHDL", "VLDL", "LDLCALC" in the last 168 hours. HgbA1c: No results for input(s): "HGBA1C" in the last 168 hours. Urine Drug Screen: No results for input(s): "LABOPIA", "COCAINSCRNUR", "LABBENZ", "AMPHETMU", "THCU", "LABBARB" in the last 168 hours.  Alcohol Level No results for input(s): "ETH" in the last 168 hours.  IMAGING past 24 hours MR BRAIN WO CONTRAST  Result Date: 11/29/2022 CLINICAL DATA:  Stroke follow-up EXAM: MRI HEAD WITHOUT CONTRAST MRA HEAD WITHOUT CONTRAST TECHNIQUE: Multiplanar, multi-echo pulse sequences of the brain and surrounding structures were acquired without intravenous contrast. Angiographic images of the Circle of Willis were acquired using MRA technique without intravenous contrast. COMPARISON:  Head CT  from earlier today FINDINGS: MRI HEAD FINDINGS Brain: Interval but late subacute to chronic infarct at the right insula, external capsule, and putamen. Evolution of infarct at the anterior right temporal cortex that was acute on prior. Advanced chronic small vessel ischemia in the cerebral white matter. Generalized brain atrophy with ventriculomegaly. Vascular: See below Skull and upper cervical spine: Normal marrow signal Sinuses/Orbits: No acute finding MRA HEAD FINDINGS Anterior circulation: M1 occlusion on prior imaging. No recurrent occlusion or flow limiting stenosis. Mild atheromatous of intracranial branches accentuated by motion. Typical skull base artifact at the carotids. Posterior circulation: Vertebral and basilar arteries are smoothly contoured and widely patent. No branch occlusion, beading, or aneurysm. IMPRESSION: Brain MRI: 1. No acute finding. 2. Progression of right MCA territory infarct compared to prior brain MRI but subacute to chronic. 3. Prominent atrophy and chronic small vessel ischemia. MRA: No recurrent occlusion or proximal flow limiting stenosis. Electronically Signed   By: Jorje Guild [name]   On: 11/29/2022 07:29   MR ANGIO HEAD WO CONTRAST  Result Date: 11/29/2022 CLINICAL DATA:  Stroke follow-up EXAM: MRI HEAD WITHOUT CONTRAST MRA HEAD  WITHOUT CONTRAST TECHNIQUE: Multiplanar, multi-echo pulse sequences of the brain and surrounding structures were acquired without intravenous contrast. Angiographic images of the Circle of Willis were acquired using MRA technique without intravenous contrast. COMPARISON:  Head CT from earlier today FINDINGS: MRI HEAD FINDINGS Brain: Interval but late subacute to chronic infarct at the right insula, external capsule, and putamen. Evolution of infarct at the anterior right temporal cortex that was acute on prior. Advanced chronic small vessel ischemia in the cerebral white matter. Generalized brain atrophy with ventriculomegaly. Vascular: See  below Skull and upper cervical spine: Normal marrow signal Sinuses/Orbits: No acute finding MRA HEAD FINDINGS Anterior circulation: M1 occlusion on prior imaging. No recurrent occlusion or flow limiting stenosis. Mild atheromatous of intracranial branches accentuated by motion. Typical skull base artifact at the carotids. Posterior circulation: Vertebral and basilar arteries are smoothly contoured and widely patent. No branch occlusion, beading, or aneurysm. IMPRESSION: Brain MRI: 1. No acute finding. 2. Progression of right MCA territory infarct compared to prior brain MRI but subacute to chronic. 3. Prominent atrophy and chronic small vessel ischemia. MRA: No recurrent occlusion or proximal flow limiting stenosis. Electronically Signed   By: Jorje Guild [name]   On: 11/29/2022 07:29   CT HEAD WO CONTRAST (5MM)  Addendum Date: 11/29/2022   ADDENDUM REPORT: 11/29/2022 02:25 ADDENDUM: Critical Value/emergent results were called by telephone at the time of interpretation on 11/29/2022 at 2:21 am to patient's nurse Caryl Comes who will attempt to contact the ordering physician Dr. Marlowe Sax with results. Dr. Marlowe Sax is not available at this time. Electronically Signed   By: Brett Fairy [name]   On: 11/29/2022 02:25   Result Date: 11/29/2022 CLINICAL DATA:  Syncope/presyncope, cerebrovascular cause suspected. EXAM: CT HEAD WITHOUT CONTRAST TECHNIQUE: Contiguous axial images were obtained from the base of the skull through the vertex without intravenous contrast. RADIATION DOSE REDUCTION: This exam was performed according to the departmental dose-optimization program which includes automated exposure control, adjustment of the mA and/or kV according to patient size and/or use of iterative reconstruction technique. COMPARISON:  11/03/2022. FINDINGS: Brain: No acute intracranial hemorrhage, midline shift or mass effect. No extra-axial fluid collection. Diffuse atrophy is noted. Extensive periventricular white matter  hypodensities are noted bilaterally. Hypodensity in the temporal lobe anteriorly, compatible with known infarct there is a hypodense region in the insula on the right which is new from the previous exam. No hydrocephalus. Vascular: No hyperdense vessel or unexpected calcification. Skull: Normal. Negative for fracture or focal lesion. Sinuses/Orbits: No acute finding. Other: None. IMPRESSION: 1. New hypodense region in the insula on the right, concerning for acute or subacute infarct. CTA head and/or MRI is recommended for further evaluation. 2. Stable hypodensity in the temporal lobe on the right, compatible with known infarct. 3. Atrophy with extensive chronic microvascular ischemic changes. Electronically Signed: By: Brett Fairy [name] On: 11/29/2022 01:56   DG CHEST PORT 1 VIEW  Result Date: 11/29/2022 CLINICAL DATA:  Syncope. EXAM: PORTABLE CHEST 1 VIEW COMPARISON:  11/27/2022. FINDINGS: The heart size and mediastinal contours are within normal limits. Mild residual airspace disease in the mid to lower right lung field, slightly improved from the prior exam. No effusion or pneumothorax. Degenerative changes in the thoracic spine. IMPRESSION: Mild residual airspace disease in the mid to right lower lung field, slightly improved from the prior exam. Electronically Signed   By: Brett Fairy [name]   On: 11/29/2022 01:00   ECHOCARDIOGRAM COMPLETE  Result Date: 11/28/2022    ECHOCARDIOGRAM REPORT  Patient Name:   Leslie [name] Date of Exam: 11/28/2022 Medical Rec #:  [id number]           Height:       67.5 in Accession #:    [id number]          Weight:       147.0 lb Date of Birth:  1941/04/24           BSA:          1.784 m Patient Age:    52 years            BP:           136/88 mmHg Patient Gender: F                   HR:           145 bpm. Exam Location:  Inpatient Procedure: 2D Echo, Cardiac Doppler and Color Doppler Indications:    Congestive Heart Failure I50.9                 Mitral valve  insufficiency I34.0  History:        Patient has prior history of Echocardiogram examinations, most                 recent 10/30/2022. CHF, COPD and Stroke, Arrythmias:Atrial                 Fibrillation; Signs/Symptoms:Hypotension. Chronic Kidney                 Disease.  Sonographer:    Ronny Flurry Referring Phys: [id number] [place]  1. Limited study with only 45 images, technically difficult  2. Left ventricular ejection fraction, by estimation, is 40 to 45%. The left ventricle has mildly decreased function. The left ventricle demonstrates global hypokinesis. There is mild asymmetric left ventricular hypertrophy. Left ventricular diastolic function could not be evaluated.  3. The mitral valve is abnormal. Mild to moderate mitral valve regurgitation.  4. Rhythm strip during this exam demonstrates atrial fib with rapid ventricular response.  5. The aortic valve is tricuspid. Aortic valve regurgitation is not visualized.  6. Right ventricular systolic function was not well visualized. The right ventricular size is not well visualized. Comparison(s): Changes from prior study are noted. 10/30/2022: LVEF 40-45%, modearate to severe MR. Conclusion(s)/Recommendation(s): Consider repeat limited echo when the HR is lower. FINDINGS  Left Ventricle: Left ventricular ejection fraction, by estimation, is 40 to 45%. The left ventricle has mildly decreased function. The left ventricle demonstrates global hypokinesis. The left ventricular internal cavity size was normal in size. There is  mild asymmetric left ventricular hypertrophy. Left ventricular diastolic function could not be evaluated due to atrial fibrillation. Left ventricular diastolic function could not be evaluated. Right Ventricle: The right ventricular size is not well visualized. Right vetricular wall thickness was not assessed. Right ventricular systolic function was not well visualized. Left Atrium: Left atrial size was normal in size.  Right Atrium: Right atrial size was normal in size. Pericardium: There is no evidence of pericardial effusion. Mitral Valve: The mitral valve is abnormal. Mild to moderate mitral valve regurgitation, with posteriorly-directed jet. Tricuspid Valve: The tricuspid valve is not well visualized. Tricuspid valve regurgitation is not demonstrated. Aortic Valve: The aortic valve is tricuspid. Aortic valve regurgitation is not visualized. Pulmonic Valve: The pulmonic valve was not well visualized. Pulmonic valve regurgitation is not visualized. Aorta: The aortic root and ascending aorta are structurally  normal, with no evidence of dilitation. IAS/Shunts: No atrial level shunt detected by color flow Doppler. EKG: Rhythm strip during this exam demonstrates atrial fib with rapid ventricular response.  LEFT VENTRICLE PLAX 2D LVIDd:         4.00 cm   Diastology LVIDs:         3.20 cm   LV e' lateral: 16.00 cm/s LV PW:         0.90 cm LV IVS:        1.30 cm LVOT diam:     1.90 cm LVOT Area:     2.84 cm  LEFT ATRIUM           Index LA diam:      3.30 cm 1.85 cm/m LA Vol (A4C): 39.9 ml 22.37 ml/m   AORTA Ao Root diam: 2.80 cm Ao Asc diam:  3.10 cm  SHUNTS Systemic Diam: 1.90 cm Lyman Bishop MD Electronically signed by Lyman Bishop MD Signature Date/Time: 11/28/2022/5:07:55 PM    Final     PHYSICAL EXAM Gen: In bed, NAD Resp: non-labored breathing, no acute distress Abd: soft, nt   Neuro: MS: Awake, alert, oriented, no dysarthria.  Able to give a clear and coherent history, follows commands CN: Visual fields full, PERRL, EOMI, mild left facial weakness, symmetric facial sensation, palate elevation symmetric, tongue protrudes midline Motor: She is able to elevate all extremities without drift Sensory: She reports decrease sensation of the left arm compared to the right, symmetric in the legs Coordination: FNF intact Gait: Deferred, patient does state that she has been ambulating to the  bathroom  ASSESSMENT/PLAN Ms. Gordon Carlson is a 81 y.o. female with history of CVA, atrial flutter fibrillation/flutter, hypothyroidism, GERD.  She was initially admitted on 11/25 with a right M1 occlusion.  She went for emergent mechanical thrombectomy with TICI3 revascularization.  MRI shows a right MCA scattered infarct.  Etiology is thought to be secondary to atrial fibrillation despite Eliquis.  Neurology was reengaged due to concern for a new stroke after an episode of decreased responsiveness.  There was concern that the head CT showed an insular hypodensity however MRI shows progression of the right MCA infarct but no acute abnormality.  Transient recrudesence of symptoms due to hypotension?  Vagal episode?; MRI negative for acute infarct Code Stroke CT head insular hypodensity MRI  Progression of right MCA territory infarct compared to prior brain MRI but subacute to chronic. MRA  No recurrent occlusion or proximal flow limiting stenosis.  2D Echo EF 40-45% LDL 86 HgbA1c 6.2 VTE prophylaxis - Eliquis    Diet   DIET DYS 3 Room service appropriate? Yes; Fluid consistency: Thin   Eliquis (apixaban) daily prior to admission, now on Eliquis (apixaban) daily.  Therapy recommendations:  pending- initially CIR Disposition: Pending  Atrial fibrillation Eliquis Amiodarone Cardiology following  Hypotension Requires midodrine 3 times daily Episodes of hypotension with systolic blood pressure in the 70s's documented  Hyperlipidemia Home meds: Rosuvastatin 10 mg LDL 86, goal < 70 May consider increase given stroke risk factors Continue statin at discharge  Other Stroke Risk Factors Advanced Age >/= 81  Hx stroke/TIA Initially admitted 11/25 right MCA small scattered infarcts due to right M1 occlusion s/p IR with TICI3, embolic secondary to afib RVR even on eliquis    Hospital day # 6  Patient seen and examined by NP/APP with MD. MD to update note as needed.   Janine Ores, DNP, FNP-BC [facility] Pager: [phone]  I have personally obtained history,examined this patient, reviewed notes, independently viewed imaging studies, participated in medical decision making and plan of care.ROS completed by me personally and pertinent positives fully documented  I have made any additions or clarifications directly to the above note. Agree with note above.  Patient developed transient worsening of deficits in the setting of possible hypertension related to vagal response.  Brain MRI shows no acute normalities.  Continue Eliquis for A-fib and transferred back to inpatient rehab.  No further neurological testing is necessary.  Greater than 50% time during this 50-minute visit was spent on counseling and coordination of care about 12 stroke and discussion about imaging findings and answering questions.  Discussed with Dr.Grunz.  Stroke team will sign off.  Kindly call for questions  Antony Contras, MD Medical Director [place] Pager: [phone] 11/29/2022 5:45 PM  To contact Stroke Continuity provider, please refer to [email or web address] After hours, contact General Neurology

## 2022-11-29 NOTE — Procedures (Signed)
Patient Name: Leslie [name]  MRN: [id number]  Epilepsy Attending: [name]  Referring Physician/Provider: Shela Leff, MD  Date: 11/29/2022 Duration: 24.58 mins  Patient history: 81 year old female with an episode of decreased responsiveness.  EEG to evaluate for seizure.  Level of alertness: Awake, drowsy  AEDs during EEG study: None  Technical aspects: This EEG study was done with scalp electrodes positioned according to the 10-20 International system of electrode placement. Electrical activity was reviewed with band pass filter of 1-'70Hz'$ , sensitivity of 7 uV/mm, display speed of 32m/sec with a '60Hz'$  notched filter applied as appropriate. EEG data were recorded continuously and digitally stored.  Video monitoring was available and reviewed as appropriate.  Description: The posterior dominant rhythm consists of 9 Hz activity of moderate voltage (25-35 uV) seen predominantly in posterior head regions, symmetric and reactive to eye opening and eye closing. Drowsiness was characterized by attenuation of the posterior background rhythm. EEG showed continuous 3 to 5 Hz theta-delta slowing in left fronto-temporal region. Hyperventilation and photic stimulation were not performed.     ABNORMALITY - Continuous slow, left fronto-temporal region  IMPRESSION: This study is suggestive of cortical dysfunction arising from left fronto-temporal region likely secondary to underlying stroke. No seizures or epileptiform discharges were seen throughout the recording.  Belladonna Lubinski OBarbra Sarks

## 2022-11-29 NOTE — Progress Notes (Signed)
[facility] PROGRESS NOTE  Damesha Lawler (DOB: 1941/06/06) KGU:[id number] PCP: Sonia Side., FNP  Brief Narrative: Leslie [name] is an 81 y.o. female with a history of hypothyroidism, GERD who presented to the ED on 10/29/2022 from PCP's office after finding new AFib with RVR. There was noted to have pneumonia with right pleural effusion treated with antibiotics and thoracentesis (transudate, negative Cx and cytology). Cardiology was consulted, eliquis started, and ultimately got rates under adequate control. On 11/25 she developed right gaze deviation, left hemiparesis and neglect due to right M1 occlusion, taken emergently for mechanical thrombectomy which restored TICI 3 flow. She stabilized, ultimately transferring to CIR 11/30. While undergoing therapy, diltiazem dose was decreased due to orthostatic hypotension. She went recurrently into rapid atrial fibrillation with hypotension prompting readmission on 12/15. Subsequently underwent TEE/DCCV 12/18. Plan was to transfer back to CIR, though she's recurrently in rapid atrial fibrillation again 12/20. Amiodarone gtt initiated. Cardiology managing, perhaps consulting EP.  Subjective: Had episodes noted last night, has returned to her normal and has no current complaints. Does not feel new weakness or numbness. Getting EEG started at time of encounter.  Objective: BP (!) 152/103 (BP Location: Right Arm)   Pulse (!) 106   Temp 98.4 F (36.9 C) (Oral)   Resp 18   Ht 5' 7.5" (1.715 m)   Wt 66.2 kg   SpO2 98%   BMI 22.51 kg/m   Gen: No distress, interactive Pulm: Clear, nonlabored CV: Irreg irreg, rate ~100bpm, no MRG or pitting edema.   GI: Soft, NT, ND, +BS  Neuro: Alert and oriented. Exam limited while on EEG but no new focal deficits. Has stable left facial droop and modest weakness in LUE.  Ext: Warm, no deformities Skin: No rashes, lesions or ulcers on visualized skin   Assessment & Plan: Paroxysmal atrial  fibrillation with RVR: s/p TEE/DCCV 12/18, reverted NSR > AFib 12/20. Does not tolerate atrial fibrillation well. Remains afebrile without leukocytosis or symptoms of infection. No anemia/evidence of bleeding or hypovolemia. No anginal complaints.  - Rebolus and augment infusion of amiodarone, load with digoxin. Options very limited with hypotension. amiodarone load and infusion.  - Cardiology assistance appreciated. ?repeat cardioversion.  - Recheck K, Mg. Supp K. Goal potassium at least 4 and magnesium at least 2 -Continue Eliquis   Delirium: Improving. Ammonia noted to be 36 (ULN is 35). No asterixis on exam currently, pt not lethargic and AMS has waxed/waned, currently much improved. No urinary symptoms nor pyuria/bacteriuria. No new CXR infiltrate. Will not initiate lactulose at this time.  - Continue delirium precautions. Neuro checks per protocol. Management and work up of insular hypodensity per stroke team.  New right insular hypodensity on CT 12/21 concerning for acute/subacute stroke: ?if transient hypoperfusion from orthostasis and hypotension due to AFib.  - Neurology consulted. EEG 12/21 shows continuous slow, left frontotemporal region suggestive of focal cortical dysfunction. No seizure detected during recording. - MRI/MRA ordered, pending - Stroke team following - Continue planned rehab w/PT/OT/SLP. 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  - Continue anticoagulation, statin.  - Continue PT/OT/SLP once hemodynamically stable. Plan is to return to CIR prior to home, though disposition will be delayed today.   Orthostatic Hypotension  Hypotension: - Augment midodrine to avoid hypotension. Small IVF bolus per discussion with cardiology.  - Continue TED hose/abdominal binder.     HFrEF, tachycardia-mediated cardiomyopathy, severe MR: LVEF 40-45% with global hypokinesis. TEE  in setting of AFib with RVR noted interval  worsening LVEF to 20%.  - Monitor I/O, weights, BMP.  - Repeat echo pending once AFib under control.  - Consider ischemic evaluation.     Vitamin B12 deficiency: B12 low normal. MMA elevated. - Continue B12 supplementation   COPD: Patient without acute exacerbation.   Hypothyroidism: TSH 11.301.   - Continue levothyroxine (increased dose)   [name], MD [facility] [email or web address] 11/29/2022, 3:36 PM

## 2022-11-29 NOTE — Progress Notes (Signed)
Dr. Lovena Le with Radiology attempted to call Rathore MD at 0200 with CT head critical result findings. Report can be found in chart. Will attempt to notifiy Rathore MD.

## 2022-11-29 NOTE — Progress Notes (Addendum)
Overnight progress note  Rapid response was called due to episode of unresponsiveness when patient was on bedside commode.  When rapid response arrived, patient was awake, alert, and oriented.  She denied chest pain or shortness of breath and exam was not concerning for stroke.  CBG 143.  Noted to be in Afib with HR in the 120-130s, blood pressure 115/70, respiratory rate 18, SpO2 100% on room air. Unfortunately, I was not urgently text paged by patient's primary nurse to notify me of this event.  Chart reviewed. 81 y.o. female with a history of hypothyroidism, GERD who presented to the ED on 10/29/2022 from PCP's office after finding new AFib with RVR. There was noted to have pneumonia with right pleural effusion treated with antibiotics and thoracentesis (transudate, negative Cx and cytology). Cardiology was consulted, eliquis started, and ultimately got rates under adequate control. On 11/25 she developed right gaze deviation, left hemiparesis and neglect due to right M1 occlusion, taken emergently for mechanical thrombectomy which restored TICI 3 flow. She stabilized, ultimately transferring to CIR 11/30. While undergoing therapy, diltiazem dose was decreased due to orthostatic hypotension. She went recurrently into rapid atrial fibrillation with hypotension prompting readmission on 12/15. Subsequently underwent TEE/DCCV 12/18. Plan was to transfer back to CIR, though she's recurrently in rapid atrial fibrillation again 12/20. Amiodarone gtt initiated. Cardiology managing. Also on midodrine and TED hose/abdominal binder for orthostatic hypotension.  Most recent vital signs: Temperature 97.6 F, heart rate 90-100, blood pressure 97/67, respiratory rate 15-16, SpO2 100% on room air.  -Orders placed for stat EKG, stat troponin x 2, stat chest x-ray -Check orthostatics, fall precautions  Addendum: Stat CT head ordered as well.

## 2022-11-30 DIAGNOSIS — I480 Paroxysmal atrial fibrillation: Secondary | ICD-10-CM

## 2022-11-30 DIAGNOSIS — Z515 Encounter for palliative care: Secondary | ICD-10-CM

## 2022-11-30 DIAGNOSIS — I639 Cerebral infarction, unspecified: Secondary | ICD-10-CM

## 2022-11-30 LAB — BASIC METABOLIC PANEL
Anion gap: 25 — ABNORMAL HIGH (ref 5–15)
Anion gap: 9 (ref 5–15)
BUN: 9 mg/dL (ref 8–23)
BUN: 10 mg/dL (ref 8–23)
CO2: 20 mmol/L — ABNORMAL LOW (ref 22–32)
CO2: 24 mmol/L (ref 22–32)
Calcium: 8.2 mg/dL — ABNORMAL LOW (ref 8.9–10.3)
Calcium: 9.7 mg/dL (ref 8.9–10.3)
Chloride: 86 mmol/L — ABNORMAL LOW (ref 98–111)
Chloride: 105 mmol/L (ref 98–111)
Creatinine, Ser: 1.14 mg/dL — ABNORMAL HIGH (ref 0.44–1.00)
Creatinine, Ser: 1.14 mg/dL — ABNORMAL HIGH (ref 0.44–1.00)
GFR, Estimated: 49 mL/min — ABNORMAL LOW (ref >60)
GFR, Estimated: 49 mL/min — ABNORMAL LOW (ref >60)
Glucose, Bld: 719 mg/dL (ref 70–99)
Glucose, Bld: 113 mg/dL — ABNORMAL HIGH (ref 70–99)
Potassium: 3.3 mmol/L — ABNORMAL LOW (ref 3.5–5.1)
Potassium: 3.7 mmol/L (ref 3.5–5.1)
Sodium: 131 mmol/L — ABNORMAL LOW (ref 135–145)
Sodium: 138 mmol/L (ref 135–145)

## 2022-11-30 LAB — CBC
HCT: 38.1 % (ref 36.0–46.0)
Hemoglobin: 11.5 g/dL — ABNORMAL LOW (ref 12.0–15.0)
MCH: 27.9 pg (ref 26.0–34.0)
MCHC: 30.2 g/dL (ref 30.0–36.0)
MCV: 92.5 fL (ref 80.0–100.0)
Platelets: 155 10*3/uL (ref 150–400)
RBC: 4.12 MIL/uL (ref 3.87–5.11)
RDW: 15.9 % — ABNORMAL HIGH (ref 11.5–15.5)
WBC: 9.7 10*3/uL (ref 4.0–10.5)
nRBC: 0 % (ref 0.0–0.2)

## 2022-11-30 LAB — URINE CULTURE: Culture: 60000 — AB

## 2022-11-30 LAB — GLUCOSE, CAPILLARY: Glucose-Capillary: 108 mg/dL — ABNORMAL HIGH (ref 70–99)

## 2022-11-30 MED ORDER — POTASSIUM CHLORIDE CRYS ER 20 MEQ PO TBCR
40.0000 meq | EXTENDED_RELEASE_TABLET | Freq: Once | ORAL | Status: AC
  Administered 2022-11-30: 40 meq via ORAL
  Filled 2022-11-30: qty 2

## 2022-11-30 MED ORDER — AMIODARONE HCL IN DEXTROSE 360-4.14 MG/200ML-% IV SOLN
30.0000 mg/h | INTRAVENOUS | Status: DC
  Administered 2022-11-30 – 2022-12-01 (×2): 30 mg/h via INTRAVENOUS
  Filled 2022-11-30 (×3): qty 200

## 2022-11-30 MED ORDER — AMIODARONE HCL IN DEXTROSE 360-4.14 MG/200ML-% IV SOLN
60.0000 mg/h | INTRAVENOUS | Status: AC
  Administered 2022-11-30 (×2): 60 mg/h via INTRAVENOUS

## 2022-11-30 NOTE — Progress Notes (Signed)
Occupational Therapy Treatment Patient Details Name: Leslie [name] MRN: [id number] DOB: Oct 30, 1941 Today's Date: 11/30/2022   History of present illness Leslie [name] is a 81 y.o. female who was transferred back to inpatient side (from [facility] being found to be in atrial flutter with heart rates into the 160s and low blood pressures. S/p TEE/cardioversion on 12/18. 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 Pt greeted seated in recliner with daughter present. Pt pleasantly confused asking why she can't go out on her deck. Session focused on transfer training with pt able to stand pivot to transport chair,doctor approved family to roll pt around unit to work on decreasing hospital delirium. Pt completed stand pivot transfer with MIN A with RW. Pt would continue to benefit from skilled occupational therapy while admitted and after d/c to address the below listed limitations in order to improve overall functional mobility and facilitate independence with BADL participation. DC plan remains appropriate, will follow acutely per POC.      Recommendations for follow up therapy are one component of a multi-disciplinary discharge planning process, led by the attending physician.  Recommendations may be updated based on patient status, additional functional criteria and insurance authorization.    Follow Up Recommendations  Acute inpatient rehab (3hours/day)     Assistance Recommended at Discharge Frequent or constant Supervision/Assistance  Patient can return home with the following  A little help with walking and/or transfers;A little help with bathing/dressing/bathroom;Assistance with cooking/housework;Assistance with feeding;Direct supervision/assist for medications management;Direct supervision/assist for financial management;Assist for  transportation;Help with stairs or ramp for entrance   Equipment Recommendations  Other (comment) (defer)    Recommendations for Other Services      Precautions / Restrictions Precautions Precautions: Fall;Other (comment) Precaution Comments: orthostatic, teds, ace wraps and ABD binder Restrictions Weight Bearing Restrictions: No       Mobility Bed Mobility               General bed mobility comments: pt OOB in recliner    Transfers Overall transfer level: Needs assistance Equipment used: Rolling walker (2 wheels) Transfers: Sit to/from Stand, Bed to chair/wheelchair/BSC Sit to Stand: Min assist     Step pivot transfers: Min assist     General transfer comment: MIN A to rise from recliner with cues for hand placement as pt pulling at cords, pt able to stand pivot to transport chair however requires cues for safety and sequencing d/t structure of chair     Balance Overall balance assessment: Needs assistance Sitting-balance support: Feet supported, No upper extremity supported Sitting balance-Leahy Scale: Fair     Standing balance support: Bilateral upper extremity supported, Reliant on assistive device for balance Standing balance-Leahy Scale: Poor                             ADL either performed or assessed with clinical judgement   ADL Overall ADL's : Needs assistance/impaired                         Toilet Transfer: Minimal assistance;Stand-pivot;Rolling walker (2 wheels) Toilet Transfer Details (indicate cue type and reason): simulated via stand pivot to transport chair, pt required MIN A for balance and safety as well as hand placement         Functional mobility during ADLs:  Minimal assistance;Cueing for safety;Cueing for sequencing;Rolling walker (2 wheels) General ADL Comments: ADL participation impacted by cognitive deficits and impaired balance    Extremity/Trunk Assessment Upper Extremity Assessment Upper Extremity  Assessment: LUE deficits/detail LUE Deficits / Details: able to use functionally during session LUE Sensation: decreased light touch;decreased proprioception LUE Coordination: decreased fine motor;decreased gross motor   Lower Extremity Assessment Lower Extremity Assessment: Defer to PT evaluation   Cervical / Trunk Assessment Cervical / Trunk Assessment: Kyphotic    Vision Patient Visual Report: No change from baseline     Perception Perception Perception: Not tested   Praxis Praxis Praxis: Not tested    Cognition Arousal/Alertness: Awake/alert Behavior During Therapy: WFL for tasks assessed/performed, Impulsive (mildy impulsive) Overall Cognitive Status: Impaired/Different from baseline Area of Impairment: Attention, Memory, Following commands, Safety/judgement, Awareness, Problem solving, Orientation                 Orientation Level: Situation Current Attention Level: Selective Memory: Decreased short-term memory Following Commands: Follows one step commands with increased time Safety/Judgement: Decreased awareness of safety, Decreased awareness of deficits Awareness: Intellectual Problem Solving: Slow processing, Decreased initiation, Difficulty sequencing, Requires verbal cues General Comments: upon entry pt speaking to her daughter, pt begins asking therapist why she cant go home and be on her deck, family wanting to get pt to w/c with pt stating "I already have a w/c" pt very disoriented to situation,        Exercises      Shoulder Instructions       General Comments teds, ace wraps, and ABD binder donned, no s/s of [place]. daughter, son in law and nurse present during session    Pertinent Vitals/ Pain       Pain Assessment Pain Assessment: No/denies pain  Home Living                                          Prior Functioning/Environment              Frequency  Min 2X/week        Progress Toward Goals  OT Goals(current  goals can now be found in the care plan section)  Progress towards OT goals: Progressing toward goals  Acute Rehab OT Goals Patient Stated Goal: to go on her deck OT Goal Formulation: With patient Time For Goal Achievement: 12/10/22 Potential to Achieve Goals: [place] Discharge plan remains appropriate;Frequency remains appropriate    Co-evaluation                 AM-PAC OT "6 Clicks" Daily Activity     Outcome Measure   Help from another person eating meals?: A Little Help from another person taking care of personal grooming?: A Little Help from another person toileting, which includes using toliet, bedpan, or urinal?: A Lot Help from another person bathing (including washing, rinsing, drying)?: A Lot Help from another person to put on and taking off regular upper body clothing?: A Little Help from another person to put on and taking off regular lower body clothing?: A Lot 6 Click Score: 15    End of Session Equipment Utilized During Treatment: Rolling walker (2 wheels)  OT Visit Diagnosis: Unsteadiness on feet (R26.81);Other abnormalities of gait and mobility (R26.89);Muscle weakness (generalized) (M62.81);Hemiplegia and hemiparesis Hemiplegia - Right/Left: Left Hemiplegia - dominant/non-dominant: Non-Dominant Hemiplegia - caused by: Cerebral infarction   Activity Tolerance  Patient tolerated treatment well   Patient Left Other (comment) (in transport chair with family, per nurse hospitalist approved)   Nurse Communication Mobility status (nurse present during session)        Time: 6314-9702 OT Time Calculation (min): 18 min  Charges: OT General Charges $OT Visit: 1 Visit OT Treatments $Therapeutic Activity: 8-22 mins Harley Alto., COTA/L Acute Rehabilitation Services [phone]   Precious Haws 11/30/2022, 4:19 PM

## 2022-11-30 NOTE — Progress Notes (Signed)
[facility] PROGRESS NOTE  Leslie [name] (DOB: 1941/07/19) ACZ:[id number] PCP: Sonia Side., FNP  Brief Narrative: Leslie [name] is an 81 y.o. female with a history of hypothyroidism, GERD who presented to the ED on 10/29/2022 from PCP's office after finding new AFib with RVR. There was noted to have pneumonia with right pleural effusion treated with antibiotics and thoracentesis (transudate, negative Cx and cytology). Cardiology was consulted, eliquis started, and ultimately got rates under adequate control. On 11/25 she developed right gaze deviation, left hemiparesis and neglect due to right M1 occlusion, taken emergently for mechanical thrombectomy which restored TICI 3 flow. She stabilized, ultimately transferring to CIR 11/30. While undergoing therapy, diltiazem dose was decreased due to orthostatic hypotension. She went recurrently into rapid atrial fibrillation with hypotension prompting readmission on 12/15. Subsequently underwent TEE/DCCV 12/18. Plan was to transfer back to CIR, though she's recurrently in rapid atrial fibrillation again 12/20. Amiodarone gtt initiated. Cardiology managing, perhaps consulting EP.  Subjective: Overall feeling better today, no shortness of breath or new weakness or numbness. No chest pain. Hungry for breakfast.  Objective: BP (!) 132/99 (BP Location: Right Arm)   Pulse 83   Temp 97.7 F (36.5 C) (Oral)   Resp 15   Ht 5' 7.5" (1.715 m)   Wt 67 kg   SpO2 100%   BMI 22.79 kg/m   Gen: No distress Pulm: Clear, nonlabored  CV: Irreg irreg, rate in 80-100's this AM, no edema or JVD. GI: Soft, NT, ND, +BS  Neuro: Alert and oriented. No new focal deficits. Ext: Warm, no deformities Skin: No rashes, lesions or ulcers on visualized skin   Assessment & Plan: Paroxysmal atrial fibrillation with RVR: s/p TEE/DCCV 12/18, reverted NSR > AFib 12/20. Does not tolerate atrial fibrillation well. Remains afebrile without leukocytosis or  symptoms of infection. No anemia/evidence of bleeding or hypovolemia. No anginal complaints. Options very limited with hypotension.  - Cardiology assistance appreciated. Continue IV amiodarone, digoxin repeated overnight. - Recheck K, Mg. Supp K again today. Note spurious glucose lab value on initial lab draw. Goal potassium at least 4 and magnesium at least 2 -Continue Eliquis   Delirium: Improving. Ammonia noted to be 36 (ULN is 35). No asterixis on exam currently, pt not lethargic and AMS has waxed/waned, currently much improved. No urinary symptoms nor pyuria/bacteriuria. No new CXR infiltrate. Will not initiate lactulose at this time.  - Continue delirium precautions. Neuro checks per protocol. Management and work up of insular hypodensity per stroke team.  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  - Continue anticoagulation, statin.  - Continue PT/OT/SLP once hemodynamically stable. Plan is to return to CIR prior to home, though disposition is delayed with ongoing need for IV amiodarone for rate control. - New stroke ruled out: Initial concern after brief unresponsive episode with right insular hypodensity on CT 12/21, no acute findings on subsequent MRI/MRA. No seizure seen on EEG. Mentation and neurological status is back to her recent baseline. No syncope.  - Avoid hypotension  Orthostatic Hypotension  Hypotension: - Augmented midodrine to avoid hypotension. May be able to deescalate this.  - Continue TED hose/abdominal binder.     HFrEF, tachycardia-mediated cardiomyopathy, severe MR: LVEF 40-45% with global hypokinesis. TEE in setting of AFib with RVR noted interval worsening LVEF to 20%.  - Monitor I/O, weights, BMP.  - Repeat echo pending once AFib under control.  - Consider ischemic evaluation.     Vitamin B12 deficiency:  B12 low normal. MMA elevated. - Continue B12 supplementation   COPD: Patient without acute exacerbation.    Hypothyroidism: TSH 11.301.   - Continue levothyroxine (increased dose)   Leslie Pour, MD [facility] [email or web address] 11/30/2022, 2:03 PM

## 2022-11-30 NOTE — Progress Notes (Signed)
Brief Palliative Medicine Progress Note:  PMT consult received and chart reviewed.   Medical records reviewed including progress notes, labs, imaging. Patient has had 3 admissions in the last 6 months with most recent being 11/20-11/30/23 - she had a working diagnosis of community acquired pneumonia and atrial fibrillation with RVR. Hospitalization was complicated by acute ischemic CVA, now s/p mechanical thrombectomy. She was discharged to CIR where she was showing functional improvements. Patient was re-admitted 11/23/22 for atrial fibrillation with RVR, hypotension, HFrEF, and history of CVA with residual deficits.   PMT is working on seeing this patient for full GOC as soon as possible. Noted plan is for discharge back to CIR pending need for IV amiodarone. If patient discharges prior to completion of inpatient consult, recommend outpatient Palliative Care to follow and/or PMT re-consult in CIR if more urgent.  Thank you for allowing PMT to assist in the care of this patient.  Breelynn Bankert M. Tamala Julian [facility] Palliative Medicine Team Team Phone: [phone] NO CHARGE

## 2022-11-30 NOTE — Progress Notes (Signed)
OT Cancellation Note  Patient Details Name: Leslie [name] MRN: [id number] DOB: [birth year]   Cancelled Treatment:    Reason Eval/Treat Not Completed: Patient at procedure or test/ unavailable;Other (comment) (lab present) lab present for blood draw upon arrival, will check back as time allows.   Harley Alto., COTA/L Acute Rehabilitation Services [phone]   Precious Haws 11/30/2022, 9:30 AM

## 2022-11-30 NOTE — Progress Notes (Addendum)
Inpatient Rehabilitation Admissions Coordinator   We have withdrawn Auth to pursue readmit to CIR with [employer] as she is not medically ready. I contacted her daughter, [name], by phone and she is aware.  Danne Baxter, RN, MSN Rehab Admissions Coordinator [phone] 11/30/2022 8:59 AM

## 2022-11-30 NOTE — Progress Notes (Signed)
Physical Therapy Treatment Patient Details Name: Leslie [name] MRN: [id number] DOB: 1941/07/08 Today's Date: 11/30/2022   History of Present Illness Leslie [name] is a 81 y.o. female who was transferred back to inpatient side (from [facility] being found to be in atrial flutter with heart rates into the 160s and low blood pressures. S/p TEE/cardioversion on 12/18. 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  There ex did support a bit and pt is demonstrating better control of standing.  Confusion level has increased with pt from her time in hosp, and had nursing come talk to family about her issues and some fixes for them. Follow acutely to walk next session if her symptoms permit.   Recommendations for follow up therapy are one component of a multi-disciplinary discharge planning process, led by the attending physician.  Recommendations may be updated based on patient status, additional functional criteria and insurance authorization.  Follow Up Recommendations  Acute inpatient rehab (3hours/day)     Assistance Recommended at Discharge Frequent or constant Supervision/Assistance  Patient can return home with the following A little help with walking and/or transfers;A little help with bathing/dressing/bathroom;Assistance with cooking/housework;Assist for transportation;Direct supervision/assist for medications management;Help with stairs or ramp for entrance   Equipment Recommendations  None recommended by PT    Recommendations for Other Services Rehab consult     Precautions / Restrictions Precautions Precautions: Fall;Other (comment) Precaution Comments: orthostatic, teds, ace wraps and ABD  binder Restrictions Weight Bearing Restrictions: No Other Position/Activity Restrictions: TED hose, abdominal binder, ace wraps     Mobility  Bed Mobility               General bed mobility comments: in recliner    Transfers Overall transfer level: Needs assistance Equipment used: Rolling walker (2 wheels), 1 person hand held assist Transfers: Sit to/from Stand Sit to Stand: Mod assist           General transfer comment: stood with BP checks    Ambulation/Gait               General Gait Details: deferred   Stairs             Wheelchair Mobility    Modified Rankin (Stroke Patients Only)       Balance Overall balance assessment: Needs assistance Sitting-balance support: Feet supported Sitting balance-Leahy Scale: Fair     Standing balance support: Bilateral upper extremity supported, During functional activity Standing balance-Leahy Scale: Poor                              Cognition Arousal/Alertness: Awake/alert Behavior During Therapy: Anxious, Restless Overall Cognitive Status: Impaired/Different from baseline Area of Impairment: Orientation, Attention, Memory, Following commands, Safety/judgement, Awareness, Problem solving                 Orientation Level: Place, Situation Current Attention Level: Selective Memory: Decreased recall of precautions, Decreased short-term memory Following Commands: Follows one step commands inconsistently, Follows one step commands with increased time Safety/Judgement: Decreased awareness of safety, Decreased awareness of deficits Awareness: Intellectual Problem Solving: Slow processing, Requires verbal cues, Requires tactile cues General Comments: disoriented to her care, goals of PT and her discharge  plans        Exercises General Exercises - Lower Extremity Ankle Circles/Pumps: AAROM, 5 reps Long Arc Quad: AROM, Strengthening, 10 reps Heel Slides: AROM, Strengthening, 10  reps Hip ABduction/ADduction: AROM, Strengthening, 10 reps    General Comments General comments (skin integrity, edema, etc.): Pt was wearing binder, wraps and TEDS, performed ex's with PT and could demonstrate better standing BP afterward      Pertinent Vitals/Pain Pain Assessment Pain Assessment: Faces Faces Pain Scale: Hurts a little bit Pain Location: back Pain Descriptors / Indicators: Guarding Pain Intervention(s): Monitored during session, Repositioned    Home Living                          Prior Function            PT Goals (current goals can now be found in the care plan section) Acute Rehab PT Goals Patient Stated Goal: to feel better    Frequency    Min 3X/week      PT Plan Current plan remains appropriate    Co-evaluation              AM-PAC PT "6 Clicks" Mobility   Outcome Measure  Help needed turning from your back to your side while in a flat bed without using bedrails?: None Help needed moving from lying on your back to sitting on the side of a flat bed without using bedrails?: A Little Help needed moving to and from a bed to a chair (including a wheelchair)?: A Little Help needed standing up from a chair using your arms (e.g., wheelchair or bedside chair)?: A Little Help needed to walk in hospital room?: A Little Help needed climbing 3-5 steps with a railing? : A Lot 6 Click Score: 18    End of Session Equipment Utilized During Treatment: Gait belt Activity Tolerance: Patient tolerated treatment well;Treatment limited secondary to medical complications (Comment) Patient left: in chair;with call bell/phone within reach;with chair alarm set;with family/visitor present Nurse Communication: Mobility status PT Visit Diagnosis: Other abnormalities of gait and mobility (R26.89);Dizziness and giddiness (R42);Unsteadiness on feet (R26.81) Hemiplegia - Right/Left: Left Hemiplegia - dominant/non-dominant: Non-dominant Hemiplegia - caused  by: Cerebral infarction     Time: 1430-1501 PT Time Calculation (min) (ACUTE ONLY): 31 min  Charges:  $Therapeutic Exercise: 8-22 mins $Therapeutic Activity: 8-22 mins             Ramond Dial 11/30/2022, 5:02 PM  Mee Hives, PT PhD Acute Rehab Dept. Number: [place]

## 2022-11-30 NOTE — Progress Notes (Signed)
Called by lab informing me Pt had a blood glucose of 719.  Manual bedside accu-check reading of 108. ? Contaminated sample. MD paged.

## 2022-11-30 NOTE — Progress Notes (Signed)
Mobility Specialist - Progress Note   11/30/22 1649  Mobility  Activity Transferred from bed to chair  Level of Assistance Minimal assist, patient does 75% or more  Assistive Device Front wheel walker  Activity Response Tolerated well  Mobility Referral Yes  $Mobility charge 1 Mobility   Pt received in wheelchair needing assistance back to chair. Pt was minA throughout transfer. Pt left in chair with all needs met and chair alarm on.  Franki Monte  Mobility Specialist Please contact via Solicitor or Rehab office at [phone]

## 2022-11-30 NOTE — Progress Notes (Signed)
Mobility Specialist - Progress Note   11/30/22 1153  Mobility  Activity Transferred from bed to chair  Level of Assistance Minimal assist, patient does 75% or more  Assistive Device Front wheel walker  Activity Response Tolerated well  Mobility Referral Yes  $Mobility charge 1 Mobility   Pt was received in bed and agreeable to session. No complaints throughout transfer. Further mobility was deferred d/t lunch arriving. Pt was left in chair with all needs met and RN notified.    Franki Monte  Mobility Specialist Please contact via Solicitor or Rehab office at [phone]

## 2022-11-30 NOTE — Progress Notes (Signed)
Rounding Note    Patient Name: Leslie [name] Date of Encounter: 11/30/2022  [place] Cardiologist: Peter Martinique, MD   Subjective   Feeling better this morning. Hoping to get breakfast soon.  Dig loaded yesterday and rebolused amiodarone HR improved to 80-100s today still in Afib Midodrine increased due to profound orthostatsis   Inpatient Medications    Scheduled Meds:  apixaban  5 mg Oral BID   vitamin B-12  1,000 mcg Oral Daily   famotidine  20 mg Oral Daily   feeding supplement  237 mL Oral TID BM   Gerhardt's butt cream   Topical BID   levothyroxine  100 mcg Oral Q0600   midodrine  5 mg Oral TID WC   mouth rinse  15 mL Mouth Rinse 4 times per day   rosuvastatin  10 mg Oral Daily   senna-docusate  2 tablet Oral BID   Continuous Infusions:  amiodarone 60 mg/hr (11/30/22 0718)    PRN Meds: acetaminophen, alum & mag hydroxide-simeth, bisacodyl **OR** sorbitol, calcium carbonate, camphor-menthol, guaiFENesin-dextromethorphan, ipratropium-albuterol, melatonin, mouth rinse, prochlorperazine **OR** prochlorperazine **OR** prochlorperazine, sodium phosphate   Vital Signs    Vitals:   11/30/22 0448 11/30/22 0449 11/30/22 0530 11/30/22 0804  BP:   115/86 (!) 132/99  Pulse:   94 83  Resp: '16 17 15 15  '$ Temp:   97.6 F (36.4 C) 97.7 F (36.5 C)  TempSrc:   Oral Oral  SpO2:   98% 100%  Weight:   67 kg   Height:        Intake/Output Summary (Last 24 hours) at 11/30/2022 0943 Last data filed at 11/30/2022 0533 Gross per 24 hour  Intake 1975.9 ml  Output 750 ml  Net 1225.9 ml       11/30/2022    5:30 AM 11/29/2022    4:24 AM 11/28/2022    4:39 AM  Last 3 Weights  Weight (lbs) 147 lb 11.3 oz 145 lb 14.4 oz 147 lb  Weight (kg) 67 kg 66.18 kg 66.679 kg      Telemetry    Afib with better rate control 80-100s- Personally Reviewed  ECG    No new tracing- Personally Reviewed  Physical Exam   GEN: Sitting up in bed,  comfortable Neck: No JVD Cardiac: Irregularly irregular, no murmurs Respiratory: Clear bilaterally GI: Soft, nontender, non-distended  MS: No edema; No deformity. Neuro:  Nonfocal  Psych: Normal affect   Labs    High Sensitivity Troponin:   Recent Labs  Lab 10/31/22 1540 10/31/22 1736 11/29/22 0241 11/29/22 0551  TROPONINIHS '12 14 8 8      '$ Chemistry Recent Labs  Lab 11/23/22 2043 11/25/22 0130 11/26/22 0608 11/29/22 0241 11/30/22 0700  NA  --    < > 139 139 131*  K  --    < > 3.5 3.4* 3.3*  CL  --    < > 105 108 86*  CO2  --    < > 26 23 20*  GLUCOSE  --    < > 105* 118* 719*  BUN  --    < > '10 9 9  '$ CREATININE  --    < > 1.25* 1.15* 1.14*  CALCIUM  --    < > 9.5 9.6 8.2*  MG 2.1  --   --  2.0  --   GFRNONAA  --    < > 44* 48* 49*  ANIONGAP  --    < > 8 8 25*   < > =  values in this interval not displayed.     Lipids No results for input(s): "CHOL", "TRIG", "HDL", "LABVLDL", "LDLCALC", "CHOLHDL" in the last 168 hours.  Hematology Recent Labs  Lab 11/26/22 0608 11/29/22 0241 11/30/22 0700  WBC 10.0 10.8* 9.7  RBC 4.51 4.69 4.12  HGB 12.8 13.0 11.5*  HCT 39.1 40.9 38.1  MCV 86.7 87.2 92.5  MCH 28.4 27.7 27.9  MCHC 32.7 31.8 30.2  RDW 15.7* 15.8* 15.9*  PLT 208 204 155    Thyroid  Recent Labs  Lab 11/25/22 0130  TSH 11.301*  FREET4 1.06     BNPNo results for input(s): "BNP", "PROBNP" in the last 168 hours.  DDimer No results for input(s): "DDIMER" in the last 168 hours.   Radiology    EEG adult  Result Date: 11/29/2022 Leslie Havens, MD     11/29/2022 12:18 PM Patient Name: Leslie [name] MRN: [id number] Epilepsy Attending: [name] Referring Physician/Provider: Shela Leff, MD Date: 11/29/2022 Duration: 24.58 mins Patient history: 81 year old female with an episode of decreased responsiveness.  EEG to evaluate for seizure. Level of alertness: Awake, drowsy AEDs during EEG study: None Technical aspects: This EEG study was  done with scalp electrodes positioned according to the 10-20 International system of electrode placement. Electrical activity was reviewed with band pass filter of 1-'70Hz'$ , sensitivity of 7 uV/mm, display speed of 66m/sec with a '60Hz'$  notched filter applied as appropriate. EEG data were recorded continuously and digitally stored.  Video monitoring was available and reviewed as appropriate. Description: The posterior dominant rhythm consists of 9 Hz activity of moderate voltage (25-35 uV) seen predominantly in posterior head regions, symmetric and reactive to eye opening and eye closing. Drowsiness was characterized by attenuation of the posterior background rhythm. EEG showed continuous 3 to 5 Hz theta-delta slowing in left fronto-temporal region. Hyperventilation and photic stimulation were not performed.   ABNORMALITY - Continuous slow, left fronto-temporal region IMPRESSION: This study is suggestive of cortical dysfunction arising from left fronto-temporal region likely secondary to underlying stroke. No seizures or epileptiform discharges were seen throughout the recording. PLora [name]  MR BRAIN WO CONTRAST  Result Date: 11/29/2022 CLINICAL DATA:  Stroke follow-up EXAM: MRI HEAD WITHOUT CONTRAST MRA HEAD WITHOUT CONTRAST TECHNIQUE: Multiplanar, multi-echo pulse sequences of the brain and surrounding structures were acquired without intravenous contrast. Angiographic images of the Circle of Willis were acquired using MRA technique without intravenous contrast. COMPARISON:  Head CT from earlier today FINDINGS: MRI HEAD FINDINGS Brain: Interval but late subacute to chronic infarct at the right insula, external capsule, and putamen. Evolution of infarct at the anterior right temporal cortex that was acute on prior. Advanced chronic small vessel ischemia in the cerebral white matter. Generalized brain atrophy with ventriculomegaly. Vascular: See below Skull and upper cervical spine: Normal marrow signal  Sinuses/Orbits: No acute finding MRA HEAD FINDINGS Anterior circulation: M1 occlusion on prior imaging. No recurrent occlusion or flow limiting stenosis. Mild atheromatous of intracranial branches accentuated by motion. Typical skull base artifact at the carotids. Posterior circulation: Vertebral and basilar arteries are smoothly contoured and widely patent. No branch occlusion, beading, or aneurysm. IMPRESSION: Brain MRI: 1. No acute finding. 2. Progression of right MCA territory infarct compared to prior brain MRI but subacute to chronic. 3. Prominent atrophy and chronic small vessel ischemia. MRA: No recurrent occlusion or proximal flow limiting stenosis. Electronically Signed   By: JJorje GuildM.D.   On: 11/29/2022 07:29   MR ANGIO HEAD WO CONTRAST  Result Date: 11/29/2022 CLINICAL DATA:  Stroke follow-up EXAM: MRI HEAD WITHOUT CONTRAST MRA HEAD WITHOUT CONTRAST TECHNIQUE: Multiplanar, multi-echo pulse sequences of the brain and surrounding structures were acquired without intravenous contrast. Angiographic images of the Circle of Willis were acquired using MRA technique without intravenous contrast. COMPARISON:  Head CT from earlier today FINDINGS: MRI HEAD FINDINGS Brain: Interval but late subacute to chronic infarct at the right insula, external capsule, and putamen. Evolution of infarct at the anterior right temporal cortex that was acute on prior. Advanced chronic small vessel ischemia in the cerebral white matter. Generalized brain atrophy with ventriculomegaly. Vascular: See below Skull and upper cervical spine: Normal marrow signal Sinuses/Orbits: No acute finding MRA HEAD FINDINGS Anterior circulation: M1 occlusion on prior imaging. No recurrent occlusion or flow limiting stenosis. Mild atheromatous of intracranial branches accentuated by motion. Typical skull base artifact at the carotids. Posterior circulation: Vertebral and basilar arteries are smoothly contoured and widely patent. No branch  occlusion, beading, or aneurysm. IMPRESSION: Brain MRI: 1. No acute finding. 2. Progression of right MCA territory infarct compared to prior brain MRI but subacute to chronic. 3. Prominent atrophy and chronic small vessel ischemia. MRA: No recurrent occlusion or proximal flow limiting stenosis. Electronically Signed   By: Jorje Guild [name]   On: 11/29/2022 07:29   CT HEAD WO CONTRAST (5MM)  Addendum Date: 11/29/2022   ADDENDUM REPORT: 11/29/2022 02:25 ADDENDUM: Critical Value/emergent results were called by telephone at the time of interpretation on 11/29/2022 at 2:21 am to patient's nurse Caryl Comes who will attempt to contact the ordering physician Dr. Marlowe Sax with results. Dr. Marlowe Sax is not available at this time. Electronically Signed   By: Brett Fairy [name]   On: 11/29/2022 02:25   Result Date: 11/29/2022 CLINICAL DATA:  Syncope/presyncope, cerebrovascular cause suspected. EXAM: CT HEAD WITHOUT CONTRAST TECHNIQUE: Contiguous axial images were obtained from the base of the skull through the vertex without intravenous contrast. RADIATION DOSE REDUCTION: This exam was performed according to the departmental dose-optimization program which includes automated exposure control, adjustment of the mA and/or kV according to patient size and/or use of iterative reconstruction technique. COMPARISON:  11/03/2022. FINDINGS: Brain: No acute intracranial hemorrhage, midline shift or mass effect. No extra-axial fluid collection. Diffuse atrophy is noted. Extensive periventricular white matter hypodensities are noted bilaterally. Hypodensity in the temporal lobe anteriorly, compatible with known infarct there is a hypodense region in the insula on the right which is new from the previous exam. No hydrocephalus. Vascular: No hyperdense vessel or unexpected calcification. Skull: Normal. Negative for fracture or focal lesion. Sinuses/Orbits: No acute finding. Other: None. IMPRESSION: 1. New hypodense region in the insula  on the right, concerning for acute or subacute infarct. CTA head and/or MRI is recommended for further evaluation. 2. Stable hypodensity in the temporal lobe on the right, compatible with known infarct. 3. Atrophy with extensive chronic microvascular ischemic changes. Electronically Signed: By: Brett Fairy [name] On: 11/29/2022 01:56   DG CHEST PORT 1 VIEW  Result Date: 11/29/2022 CLINICAL DATA:  Syncope. EXAM: PORTABLE CHEST 1 VIEW COMPARISON:  11/27/2022. FINDINGS: The heart size and mediastinal contours are within normal limits. Mild residual airspace disease in the mid to lower right lung field, slightly improved from the prior exam. No effusion or pneumothorax. Degenerative changes in the thoracic spine. IMPRESSION: Mild residual airspace disease in the mid to right lower lung field, slightly improved from the prior exam. Electronically Signed   By: Brett Fairy [name]   On: 11/29/2022  01:00   ECHOCARDIOGRAM COMPLETE  Result Date: 11/28/2022    ECHOCARDIOGRAM REPORT   Patient Name:   Leslie [name] Date of Exam: 11/28/2022 Medical Rec #:  [id number]           Height:       67.5 in Accession #:    [id number]          Weight:       147.0 lb Date of Birth:  11/26/1941           BSA:          1.784 m Patient Age:    81 years            BP:           136/88 mmHg Patient Gender: F                   HR:           145 bpm. Exam Location:  Inpatient Procedure: 2D Echo, Cardiac Doppler and Color Doppler Indications:    Congestive Heart Failure I50.9                 Mitral valve insufficiency I34.0  History:        Patient has prior history of Echocardiogram examinations, most                 recent 10/30/2022. CHF, COPD and Stroke, Arrythmias:Atrial                 Fibrillation; Signs/Symptoms:Hypotension. Chronic Kidney                 Disease.  Sonographer:    Ronny Flurry Referring Phys: [id number] [place]  1. Limited study with only 45 images, technically difficult  2. Left  ventricular ejection fraction, by estimation, is 40 to 45%. The left ventricle has mildly decreased function. The left ventricle demonstrates global hypokinesis. There is mild asymmetric left ventricular hypertrophy. Left ventricular diastolic function could not be evaluated.  3. The mitral valve is abnormal. Mild to moderate mitral valve regurgitation.  4. Rhythm strip during this exam demonstrates atrial fib with rapid ventricular response.  5. The aortic valve is tricuspid. Aortic valve regurgitation is not visualized.  6. Right ventricular systolic function was not well visualized. The right ventricular size is not well visualized. Comparison(s): Changes from prior study are noted. 10/30/2022: LVEF 40-45%, modearate to severe MR. Conclusion(s)/Recommendation(s): Consider repeat limited echo when the HR is lower. FINDINGS  Left Ventricle: Left ventricular ejection fraction, by estimation, is 40 to 45%. The left ventricle has mildly decreased function. The left ventricle demonstrates global hypokinesis. The left ventricular internal cavity size was normal in size. There is  mild asymmetric left ventricular hypertrophy. Left ventricular diastolic function could not be evaluated due to atrial fibrillation. Left ventricular diastolic function could not be evaluated. Right Ventricle: The right ventricular size is not well visualized. Right vetricular wall thickness was not assessed. Right ventricular systolic function was not well visualized. Left Atrium: Left atrial size was normal in size. Right Atrium: Right atrial size was normal in size. Pericardium: There is no evidence of pericardial effusion. Mitral Valve: The mitral valve is abnormal. Mild to moderate mitral valve regurgitation, with posteriorly-directed jet. Tricuspid Valve: The tricuspid valve is not well visualized. Tricuspid valve regurgitation is not demonstrated. Aortic Valve: The aortic valve is tricuspid. Aortic valve regurgitation is not visualized.  Pulmonic Valve: The pulmonic valve was not well  visualized. Pulmonic valve regurgitation is not visualized. Aorta: The aortic root and ascending aorta are structurally normal, with no evidence of dilitation. IAS/Shunts: No atrial level shunt detected by color flow Doppler. EKG: Rhythm strip during this exam demonstrates atrial fib with rapid ventricular response.  LEFT VENTRICLE PLAX 2D LVIDd:         4.00 cm   Diastology LVIDs:         3.20 cm   LV e' lateral: 16.00 cm/s LV PW:         0.90 cm LV IVS:        1.30 cm LVOT diam:     1.90 cm LVOT Area:     2.84 cm  LEFT ATRIUM           Index LA diam:      3.30 cm 1.85 cm/m LA Vol (A4C): 39.9 ml 22.37 ml/m   AORTA Ao Root diam: 2.80 cm Ao Asc diam:  3.10 cm  SHUNTS Systemic Diam: 1.90 cm Lyman Bishop MD Electronically signed by Lyman Bishop MD Signature Date/Time: 11/28/2022/5:07:55 PM    Final     Cardiac Studies  TEE 11/26/22: IMPRESSIONS     1. Left ventricular ejection fraction, by estimation, is 20%. The left  ventricle has severely decreased function.   2. Right ventricular systolic function is severely reduced. The right  ventricular size is mildly enlarged.   3. Left atrial size was moderately dilated. No left atrial/left atrial  appendage thrombus was detected. The LAA emptying velocity was 35 cm/s.   4. A small pericardial effusion is present.   5. The mitral valve is grossly normal. Severe mitral valve regurgitation,  likely secondary to LV dysfunction and atrial functional MR. Multiple  jets, appears commissural.   6. The aortic valve is grossly normal. Aortic valve regurgitation is  mild.   7. There is mild (Grade II) atheroma plaque involving the aortic arch and  descending aorta.   Conclusion(s)/Recommendation(s): No LA/LAA thrombus identified. Successful  cardioversion performed with restoration of normal sinus rhythm.    Patient Profile     81 y.o. 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 Patient was recently admitted from 10/29/2022 to 11/08/2022 for new onset atrial fibrillation and was found to have a reduced EF of 40-45%. Hospitalization was complicated by community acquired pneumonia (which was initially diagnosed prior to this admission) with right pleural effusion s/p thoracentesis on 10/30/2022, and acute stroke requiring thrombectomy. She was discharged to [facility] on 11/08/2022. She went back into rapid atrial fibrillation with rates in the 150s on 11/23/2022 and was hypotensive with it. Therefore, she was readmitted as an inpatient and Cardiology consulted for assistance.   Assessment & Plan     #Persistent Atrial Fibrillation:  Patient with recent admission for stroke found to have new onset atrial fibrillation. Echo showed LVEF of 40-45%. She was started on Diltiazem and apixaban. She was discharged to [facility] but represented on this admission for recurrent rapid atrial fibrillation with rates in the 150s with associated hypotension for which Cardiology was consulted. Given difficulty with rate control and soft blood pressures limiting nodal agents, she underwent TEE/DCCV on 11/26/22 with initial return to NSR but developed recurrent RVR on 12/20 now back on amiodarone gtt. - S/p successful TEE/DCCV on 11/26/22 but developed recurrent RVR on 12/20 - HR improved;  will continue IV amiodarone today and consider switching to PO tomorrow - Likely will need amiodarone long term  - Can consider low dose dig as well if needed but would need to be careful given renal function - Cannot tolerate nodal agents due to significant orthostatic hypotension - Continue Eliquis '5mg'$  BID  #Episode of decreased responsiveness: #Recent MCA CVA Patient had episode of decreased responsiveness with nonsensical speech for a brief period.  Underwent CT head which showed new hypodensity in the right insular region. Follow-up brain MRI fortunately with no acute finding. Had progression of right MCA territory infract but was subacute to chronic. MRA with no recurrent occlusion or proximal flow limiting stenosis. -Management per neuro and primary  #Chronic Systolic CHF:  Echo during recent admission showed LVEF of 40-45% with global hypokinesis , moderate asymmetric LVH of the basal-septal segment and moderate to severe MR. TEE on 11/26/22 with interval drop in EF to 20% in the setting of Afib with RVR. Currently euvolemic on exam. On amiodarone gtt for recurrent RVR. -TEE with interval drop in EF to 20% in the setting of Afib with RVR -Will managed Afib as above -Unable to tolerate GDMT due to orthostatic hypotension  -Plan to repeat TTE if able to restore NSR/better rate control to reassess LVEF   #Hypotension: Patient has had chronic orthostatic hypotension at [facility] that acutely worsened on 12/15 in the setting of Afib with RVR. Dilt stopped and midodrine started. Continues to have significant orthostasis limiting medical therapy. -Continue midodrine '5mg'$  TID -Continue compression socks and abdominal binder   #Severe MR: Severe on TEE. Likely functional. Will plan to repeat once back in NSR or once better rate controlled.  For questions or updates, please contact [place] Please consult [email or web address] for contact info under        Signed, Freada Bergeron, MD  11/30/2022, 9:43 AM

## 2022-12-01 LAB — BASIC METABOLIC PANEL
Anion gap: 7 (ref 5–15)
BUN: 11 mg/dL (ref 8–23)
CO2: 25 mmol/L (ref 22–32)
Calcium: 9.6 mg/dL (ref 8.9–10.3)
Chloride: 108 mmol/L (ref 98–111)
Creatinine, Ser: 1.13 mg/dL — ABNORMAL HIGH (ref 0.44–1.00)
GFR, Estimated: 49 mL/min — ABNORMAL LOW (ref >60)
Glucose, Bld: 96 mg/dL (ref 70–99)
Potassium: 4.5 mmol/L (ref 3.5–5.1)
Sodium: 140 mmol/L (ref 135–145)

## 2022-12-01 LAB — HEMOGLOBIN AND HEMATOCRIT, BLOOD
HCT: 42.2 % (ref 36.0–46.0)
Hemoglobin: 13.2 g/dL (ref 12.0–15.0)

## 2022-12-01 MED ORDER — MIDODRINE HCL 5 MG PO TABS
5.0000 mg | ORAL_TABLET | Freq: Three times a day (TID) | ORAL | Status: DC
  Administered 2022-12-01 – 2022-12-06 (×12): 5 mg via ORAL
  Filled 2022-12-01 (×15): qty 1

## 2022-12-01 MED ORDER — AMOXICILLIN 500 MG PO CAPS
500.0000 mg | ORAL_CAPSULE | Freq: Three times a day (TID) | ORAL | Status: DC
  Administered 2022-12-01: 500 mg via ORAL
  Filled 2022-12-01 (×2): qty 1

## 2022-12-01 MED ORDER — MIDODRINE HCL 5 MG PO TABS
2.5000 mg | ORAL_TABLET | Freq: Three times a day (TID) | ORAL | Status: DC
  Administered 2022-12-01 (×2): 2.5 mg via ORAL
  Filled 2022-12-01 (×2): qty 1

## 2022-12-01 MED ORDER — AMIODARONE HCL 200 MG PO TABS
400.0000 mg | ORAL_TABLET | Freq: Two times a day (BID) | ORAL | Status: AC
  Administered 2022-12-01 – 2022-12-04 (×8): 400 mg via ORAL
  Filled 2022-12-01 (×8): qty 2

## 2022-12-01 MED ORDER — AMOXICILLIN 500 MG PO CAPS
500.0000 mg | ORAL_CAPSULE | Freq: Three times a day (TID) | ORAL | Status: AC
  Administered 2022-12-01 – 2022-12-07 (×16): 500 mg via ORAL
  Filled 2022-12-01 (×21): qty 1

## 2022-12-01 NOTE — Progress Notes (Signed)
Occupational Therapy Treatment Patient Details Name: Leslie [name] MRN: [id number] DOB: 08/19/1941 Today's Date: 12/01/2022   History of present illness Leslie [name] is a 81 y.o. female who was transferred back to inpatient side (from [facility] being found to be in atrial flutter with heart rates into the 160s and low blood pressures. S/p TEE/cardioversion on 12/18. 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 Patient assisted with rolling in bed and bridge to pull up mesh panties. Compression stocking was on patient and theraist assisted with ace wraps for BLE and abdominal binders. Patient's BP checked in supine at 125/105. Patient able to get to EOB and BP was 84/67 and patient stated she was beginning to feel nauseous. Patient transferred to recliner and BP was 106/69. Patient performed grooming tasks seated in recliner and BP was 116/71 following. Patient stood from recliner to check BP while standing and patient asked to sit before reading was complete due to nausea and BP was 92/54 back in recliner. Patient to continue to be followed by acute OT to further address self care and functional transfers.    Recommendations for follow up therapy are one component of a multi-disciplinary discharge planning process, led by the attending physician.  Recommendations may be updated based on patient status, additional functional criteria and insurance authorization.    Follow Up Recommendations  Acute inpatient rehab (3hours/day)     Assistance Recommended at Discharge Frequent or constant Supervision/Assistance  Patient can return home with the following  A little help with walking and/or transfers;A little help with bathing/dressing/bathroom;Assistance with cooking/housework;Assistance with feeding;Direct supervision/assist for medications  management;Direct supervision/assist for financial management;Assist for transportation;Help with stairs or ramp for entrance   Equipment Recommendations  Other (comment) (defer)    Recommendations for Other Services      Precautions / Restrictions Precautions Precautions: Fall;Other (comment) Precaution Comments: orthostatic, teds, ace wraps and ABD binder Restrictions Weight Bearing Restrictions: No Other Position/Activity Restrictions: TED hose, abdominal binder, ace wraps       Mobility Bed Mobility Overal bed mobility: Needs Assistance Bed Mobility: Supine to Sit, Rolling Rolling: Supervision   Supine to sit: Supervision, HOB elevated     General bed mobility comments: verbal cues for rails use and increased time    Transfers Overall transfer level: Needs assistance Equipment used: Rolling walker (2 wheels) Transfers: Sit to/from Stand Sit to Stand: Min assist     Step pivot transfers: Min assist     General transfer comment: min assist for safety an due to feeling nausea     Balance Overall balance assessment: Needs assistance Sitting-balance support: Feet supported Sitting balance-Leahy Scale: Fair Sitting balance - Comments: supervision static sitting   Standing balance support: Bilateral upper extremity supported, During functional activity Standing balance-Leahy Scale: Poor Standing balance comment: min guard for static standing with BUE support but limited standing tolerance due to orthostatic                           ADL either performed or assessed with clinical judgement   ADL Overall ADL's : Needs assistance/impaired     Grooming: Wash/dry hands;Wash/dry face;Set up;Sitting                   Toilet Transfer: Minimal assistance;Stand-pivot;Rolling walker (2 wheels) Toilet Transfer Details (indicate cue type and reason): simulated to  recliner           General ADL Comments: patient complaining of feeling nausea when  standing, performed seated grooming tasks    Extremity/Trunk Assessment Upper Extremity Assessment LUE Deficits / Details: able to use functionally during session LUE Sensation: decreased light touch;decreased proprioception LUE Coordination: decreased fine motor;decreased gross motor            Vision       Perception     Praxis      Cognition Arousal/Alertness: Awake/alert Behavior During Therapy: Anxious, Restless Overall Cognitive Status: Impaired/Different from baseline Area of Impairment: Orientation, Attention, Memory, Following commands, Safety/judgement, Awareness, Problem solving                 Orientation Level: Place, Situation Current Attention Level: Selective Memory: Decreased recall of precautions, Decreased short-term memory Following Commands: Follows one step commands inconsistently, Follows one step commands with increased time Safety/Judgement: Decreased awareness of safety, Decreased awareness of deficits Awareness: Intellectual Problem Solving: Slow processing, Requires verbal cues, Requires tactile cues          Exercises      Shoulder Instructions       General Comments after session seated in recliner HR 100 BP 92/54 after standing    Pertinent Vitals/ Pain       Pain Assessment Pain Assessment: Faces Faces Pain Scale: Hurts a little bit Pain Location: back Pain Descriptors / Indicators: Guarding Pain Intervention(s): Monitored during session, Premedicated before session, Repositioned  Home Living                                          Prior Functioning/Environment              Frequency  Min 2X/week        Progress Toward Goals  OT Goals(current goals can now be found in the care plan section)  Progress towards OT goals: Progressing toward goals  Acute Rehab OT Goals Patient Stated Goal: get better OT Goal Formulation: With patient Time For Goal Achievement: 12/10/22 Potential to  Achieve Goals: Fair ADL Goals Pt Will Perform Grooming: with modified independence;standing Pt Will Perform Upper Body Dressing: with min assist;sitting Pt Will Perform Lower Body Dressing: with min assist;sitting/lateral leans;sit to/from stand Pt Will Transfer to Toilet: with min assist;ambulating;regular height toilet Additional ADL Goal #1: pt will indep complete IADL medication management task  Plan Discharge plan remains appropriate;Frequency remains appropriate    Co-evaluation                 AM-PAC OT "6 Clicks" Daily Activity     Outcome Measure   Help from another person eating meals?: A Little Help from another person taking care of personal grooming?: A Little Help from another person toileting, which includes using toliet, bedpan, or urinal?: A Lot Help from another person bathing (including washing, rinsing, drying)?: A Lot Help from another person to put on and taking off regular upper body clothing?: A Little Help from another person to put on and taking off regular lower body clothing?: A Lot 6 Click Score: 15    End of Session Equipment Utilized During Treatment: Rolling walker (2 wheels)  OT Visit Diagnosis: Unsteadiness on feet (R26.81);Other abnormalities of gait and mobility (R26.89);Muscle weakness (generalized) (M62.81);Hemiplegia and hemiparesis Hemiplegia - Right/Left: Left Hemiplegia - dominant/non-dominant: Non-Dominant Hemiplegia - caused by: Cerebral infarction   Activity  Tolerance Patient tolerated treatment well;Other (comment) (orthostatic when standing)   Patient Left     Nurse Communication Mobility status        Time: 8832-5498 OT Time Calculation (min): 34 min  Charges: OT General Charges $OT Visit: 1 Visit OT Treatments $Self Care/Home Management : 8-22 mins $Therapeutic Activity: 8-22 mins  Lodema Hong, OTA Acute Rehabilitation Services  Office [phone]   Trixie Dredge 12/01/2022, 12:21 PM

## 2022-12-01 NOTE — Progress Notes (Signed)
[facility] PROGRESS NOTE  Leslie [name] (DOB: Sep 29, 1941) LFY:[id number] PCP: Sonia Side., FNP  Brief Narrative: Leslie [name] is an 81 y.o. female with a history of hypothyroidism, GERD who presented to the ED on 10/29/2022 from PCP's office after finding new AFib with RVR. There was noted to have pneumonia with right pleural effusion treated with antibiotics and thoracentesis (transudate, negative Cx and cytology). Cardiology was consulted, eliquis started, and ultimately got rates under adequate control. On 11/25 she developed right gaze deviation, left hemiparesis and neglect due to right M1 occlusion, taken emergently for mechanical thrombectomy which restored TICI 3 flow. She stabilized, ultimately transferring to CIR 11/30. While undergoing therapy, diltiazem dose was decreased due to orthostatic hypotension. She went recurrently into rapid atrial fibrillation with hypotension prompting readmission on 12/15. Subsequently underwent TEE/DCCV 12/18. Plan was to transfer back to CIR, though she's recurrently in rapid atrial fibrillation again 12/20. Amiodarone gtt initiated. Cardiology managing, perhaps consulting EP.  Subjective: Says she feels better, got up with therapy with abd binder, TED hose to chair and for the first time in a while did not feel nauseated. Overnight she had severe delirium again, much worse than at any point of her hospitalization. No chest pain. However, when orthostatic BP checked she remains severely orthostatic.  Objective: BP 105/64 (BP Location: Right Arm)   Pulse 91   Temp 98 F (36.7 C) (Oral)   Resp 18   Ht 5' 7.5" (1.715 m)   Wt 67.5 kg   SpO2 96%   BMI 22.96 kg/m   Gen: Pleasant mildly tangential but cooperative elderly female sitting in chair in no distress Pulm: Clear, nonlabored  CV: Irreg irreg, no pitting edema (TED hose on), no JVD GI: Soft, NT, ND, +BS  with abdominal binder in place. Neuro: Alert, still thinks someone  moved her out of this room to another person's house last night Cooperative, says she's able to use tongue better than before. No new focal deficits. Ext: Warm, no deformities Skin: No new rashes, lesions or ulcers on visualized skin   Assessment & Plan: Paroxysmal atrial fibrillation with RVR: s/p TEE/DCCV 12/18, reverted NSR > AFib 12/20. Does not tolerate atrial fibrillation well. Options limited with hypotension.  - Cardiology assistance appreciated. Continue amiodarone with oral loading, '400mg'$  BID then '200mg'$  daily. prn digoxin, not currently required.  - Goal potassium at least 4 and magnesium at least 2 - Continue Eliquis   Delirium: Improving. Ammonia noted to be 36 (ULN is 35). No asterixis on exam, no other hepatic dysfunction or lethargy.  - Continue delirium precautions. Mobilize as much as possible, may go into hallway in [facility].  - This has notably worsened over the past several days with urine culture growing E. faecalis and no alternative explanation. Tx possible UTI as below.   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  - Continue anticoagulation, statin.  - Continue PT/OT/SLP once hemodynamically stable. Plan is to return to CIR prior to home, though disposition is delayed with ongoing need for IV amiodarone for rate control. - New stroke ruled out: Initial concern after brief unresponsive episode with right insular hypodensity on CT 12/21, no acute findings on subsequent MRI/MRA. No seizure seen on EEG. Mentation and neurological status is back to her recent baseline. No syncope.  - Avoid hypotension  UTI:  - Start amoxicillin per discussion with family and culture results (enterococcus)  Orthostatic Hypotension  Hypotension: - Augmented midodrine to avoid  hypotension. Attempted to decrease dose this morning, but remains severely orthostatic BP though notably her symptoms were much better, no severe nausea as is her usual. - Continue TED  hose/abdominal binder.    HFrEF, tachycardia-mediated cardiomyopathy, severe MR: LVEF 40-45% with global hypokinesis. TEE in setting of AFib with RVR noted interval worsening LVEF to 20%.  - Monitor I/O, weights, BMP.  - MR will make AFib less tolerable. May consider repeat cardioversion after amiodarone load per discussion with cardiology. - Consider ischemic evaluation.     Vitamin B12 deficiency: B12 low normal. MMA elevated. - Continue B12 supplementation   COPD: Patient without acute exacerbation.   Hypothyroidism: TSH 11.301.   - Continue levothyroxine (increased dose)   [name], MD [facility] [email or web address] 12/01/2022, 1:32 PM

## 2022-12-01 NOTE — Progress Notes (Addendum)
Rounding Note    Patient Name: Leslie [name] Date of Encounter: 12/01/2022  [place] Cardiologist: Peter Martinique, MD   Subjective   HR improved to 80-100s today still in Afib Midodrine increased yesterday due to profound orthostatsis but dose subsequently decreased back to 2.5 mg po TID  Inpatient Medications    Scheduled Meds:  amoxicillin  500 mg Oral Q8H   apixaban  5 mg Oral BID   vitamin B-12  1,000 mcg Oral Daily   famotidine  20 mg Oral Daily   feeding supplement  237 mL Oral TID BM   Gerhardt's butt cream   Topical BID   levothyroxine  100 mcg Oral Q0600   midodrine  2.5 mg Oral TID WC   mouth rinse  15 mL Mouth Rinse 4 times per day   rosuvastatin  10 mg Oral Daily   senna-docusate  2 tablet Oral BID   Continuous Infusions:  amiodarone 30 mg/hr (12/01/22 0612)    PRN Meds: acetaminophen, alum & mag hydroxide-simeth, bisacodyl **OR** sorbitol, calcium carbonate, camphor-menthol, guaiFENesin-dextromethorphan, ipratropium-albuterol, melatonin, mouth rinse, prochlorperazine **OR** prochlorperazine **OR** prochlorperazine, sodium phosphate   Vital Signs    Vitals:   11/30/22 0530 11/30/22 0804 11/30/22 2109 12/01/22 0508  BP: 115/86 (!) 132/99 (!) 128/90 137/85  Pulse: 94 83 86 90  Resp: '15 15 16 15  '$ Temp: 97.6 F (36.4 C) 97.7 F (36.5 C) 97.6 F (36.4 C) 98.1 F (36.7 C)  TempSrc: Oral Oral Oral Oral  SpO2: 98% 100% 100% 96%  Weight: 67 kg   67.5 kg  Height:        Intake/Output Summary (Last 24 hours) at 12/01/2022 0937 Last data filed at 12/01/2022 0526 Gross per 24 hour  Intake 740.52 ml  Output 1100 ml  Net -359.48 ml      12/01/2022    5:08 AM 11/30/2022    5:30 AM 11/29/2022    4:24 AM  Last 3 Weights  Weight (lbs) 148 lb 13 oz 147 lb 11.3 oz 145 lb 14.4 oz  Weight (kg) 67.5 kg 67 kg 66.18 kg      Telemetry    Afib  80-100s- Personally Reviewed  ECG    No new tracing- Personally Reviewed  Physical Exam    GEN: Sitting up in bed, comfortable Neck: No JVD Cardiac: Irregularly irregular, systolic apical murmur Respiratory: Clear bilaterally GI: Soft, nontender, non-distended  MS: No edema; No deformity. Neuro:  Nonfocal  Psych: Normal affect   Labs    High Sensitivity Troponin:   Recent Labs  Lab 11/29/22 0241 11/29/22 0551  TROPONINIHS 8 8     Chemistry Recent Labs  Lab 11/29/22 0241 11/30/22 0700 11/30/22 0940 12/01/22 0138  NA 139 131* 138 140  K 3.4* 3.3* 3.7 4.5  CL 108 86* 105 108  CO2 23 20* 24 25  GLUCOSE 118* 719* 113* 96  BUN '9 9 10 11  '$ CREATININE 1.15* 1.14* 1.14* 1.13*  CALCIUM 9.6 8.2* 9.7 9.6  MG 2.0  --   --   --   GFRNONAA 48* 49* 49* 49*  ANIONGAP 8 25* 9 7    Lipids No results for input(s): "CHOL", "TRIG", "HDL", "LABVLDL", "LDLCALC", "CHOLHDL" in the last 168 hours.  Hematology Recent Labs  Lab 11/26/22 0608 11/29/22 0241 11/30/22 0700 12/01/22 0138  WBC 10.0 10.8* 9.7  --   RBC 4.51 4.69 4.12  --   HGB 12.8 13.0 11.5* 13.2  HCT 39.1 40.9 38.1 42.2  MCV 86.7 87.2 92.5  --   MCH 28.4 27.7 27.9  --   MCHC 32.7 31.8 30.2  --   RDW 15.7* 15.8* 15.9*  --   PLT 208 204 155  --    Thyroid  Recent Labs  Lab 11/25/22 0130  TSH 11.301*  FREET4 1.06    BNPNo results for input(s): "BNP", "PROBNP" in the last 168 hours.  DDimer No results for input(s): "DDIMER" in the last 168 hours.   Radiology    EEG adult  Result Date: 11/29/2022 [name], MD     11/29/2022 12:18 PM Patient Name: Leslie [name] MRN: [id number] Epilepsy Attending: [name] Referring Physician/Provider: Shela Leff, MD Date: 11/29/2022 Duration: 24.58 mins Patient history: 81 year old female with an episode of decreased responsiveness.  EEG to evaluate for seizure. Level of alertness: Awake, drowsy AEDs during EEG study: None Technical aspects: This EEG study was done with scalp electrodes positioned according to the 10-20 International system of  electrode placement. Electrical activity was reviewed with band pass filter of 1-'70Hz'$ , sensitivity of 7 uV/mm, display speed of 29m/sec with a '60Hz'$  notched filter applied as appropriate. EEG data were recorded continuously and digitally stored.  Video monitoring was available and reviewed as appropriate. Description: The posterior dominant rhythm consists of 9 Hz activity of moderate voltage (25-35 uV) seen predominantly in posterior head regions, symmetric and reactive to eye opening and eye closing. Drowsiness was characterized by attenuation of the posterior background rhythm. EEG showed continuous 3 to 5 Hz theta-delta slowing in left fronto-temporal region. Hyperventilation and photic stimulation were not performed.   ABNORMALITY - Continuous slow, left fronto-temporal region IMPRESSION: This study is suggestive of cortical dysfunction arising from left fronto-temporal region likely secondary to underlying stroke. No seizures or epileptiform discharges were seen throughout the recording. PLora Havens   Cardiac Studies  TEE 11/26/22: IMPRESSIONS     1. Left ventricular ejection fraction, by estimation, is 20%. The left  ventricle has severely decreased function.   2. Right ventricular systolic function is severely reduced. The right  ventricular size is mildly enlarged.   3. Left atrial size was moderately dilated. No left atrial/left atrial  appendage thrombus was detected. The LAA emptying velocity was 35 cm/s.   4. A small pericardial effusion is present.   5. The mitral valve is grossly normal. Severe mitral valve regurgitation,  likely secondary to LV dysfunction and atrial functional MR. Multiple  jets, appears commissural.   6. The aortic valve is grossly normal. Aortic valve regurgitation is  mild.   7. There is mild (Grade II) atheroma plaque involving the aortic arch and  descending aorta.   Conclusion(s)/Recommendation(s): No LA/LAA thrombus identified. Successful   cardioversion performed with restoration of normal sinus rhythm.    Patient Profile     81y.o. female with a history of paroxysmal atrial fibrillation on Eliquis, chronic systolic CHF with EF of 462-94% mitral regurgitation, CKD stage III, hypothyroidism, GERD, and multiple CVA with recent stroke on 11/03/2022 s/p thrombectomy. Patient was recently admitted from 10/29/2022 to 11/08/2022 for new onset atrial fibrillation and was found to have a reduced EF of 40-45%. Hospitalization was complicated by community acquired pneumonia (which was initially diagnosed prior to this admission) with right pleural effusion s/p thoracentesis on 10/30/2022, and acute stroke requiring thrombectomy. She was discharged to CIR on 11/08/2022. She went back into rapid atrial fibrillation with rates in the 150s on 11/23/2022 and was hypotensive with it. Therefore,  she was readmitted as an inpatient and Cardiology consulted for assistance.   Assessment & Plan     #Persistent Atrial Fibrillation:  Patient with recent admission for stroke found to have new onset atrial fibrillation. Echo showed LVEF of 40-45%. She was started on Diltiazem and apixaban. She was discharged to CIR but represented on this admission for recurrent rapid atrial fibrillation with rates in the 150s with associated hypotension for which Cardiology was consulted. Given difficulty with rate control and soft blood pressures limiting nodal agents, she underwent TEE/DCCV on 11/26/22 with initial return to NSR but developed recurrent RVR on 12/20 now back on amiodarone gtt. - S/p successful TEE/DCCV on 11/26/22 but developed recurrent RVR on 12/20 - HR improved; IV amiodarone can be transitioned to oral today, 400 mg BID for 4 days to complete loading, then 200 mg daily going forward. - Likely will need amiodarone long term  - Can consider low dose dig as needed but would need to be careful given renal function, no additional dosing required at this time  given adequate rate control.  - Cannot tolerate nodal agents due to significant orthostatic hypotension - Continue Eliquis '5mg'$  BID  #Episode of decreased responsiveness: #Recent MCA CVA Patient had episode of decreased responsiveness with nonsensical speech for a brief period. Underwent CT head which showed new hypodensity in the right insular region. Follow-up brain MRI fortunately with no acute finding. Had progression of right MCA territory infract but was subacute to chronic. MRA with no recurrent occlusion or proximal flow limiting stenosis. -Management per neuro and primary  #Chronic Systolic CHF:  Echo during recent admission showed LVEF of 40-45% with global hypokinesis , moderate asymmetric LVH of the basal-septal segment and moderate to severe MR. TEE on 11/26/22 with interval drop in EF to 20% in the setting of Afib with RVR. Currently euvolemic on exam. On amiodarone for recurrent RVR. -TEE with interval drop in EF to 20% in the setting of Afib with RVR -Unable to tolerate GDMT due to orthostatic hypotension  - repeat TTE shows similarly depressed EF, likely 25-30%. Needs rate and rhythm control as much as possible.    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 Dilt stopped and midodrine started. Continues to have significant orthostasis limiting medical therapy. -Continue midodrine '5mg'$  TID - this was decreased by primary service to 2.5 mg TID. -Continue compression socks and abdominal binder   #Severe MR: Severe on TEE. Likely functional. Will plan to repeat TTE once back in NSR or once better rate controlled.  For questions or updates, please contact [place] Please consult [email or web address] for contact info under        Signed, Elouise Munroe, MD  12/01/2022, 9:37 AM

## 2022-12-01 NOTE — Progress Notes (Signed)
Mobility Specialist - Progress Note   12/01/22 1256  Mobility  Activity Transferred from chair to bed  Level of Assistance Minimal assist, patient does 75% or more  Assistive Device Front wheel walker  Activity Response Tolerated well  Mobility Referral Yes  $Mobility charge 1 Mobility   Pt received in chair requesting assistance to bed. Pt was MinA throughout transfer. Pt left in bed with all needs met and bed alarm on.  Franki Monte  Mobility Specialist Please contact via Solicitor or Rehab office at [phone]

## 2022-12-02 DIAGNOSIS — Z7189 Other specified counseling: Secondary | ICD-10-CM

## 2022-12-02 MED ORDER — ONDANSETRON HCL 4 MG/2ML IJ SOLN
4.0000 mg | Freq: Four times a day (QID) | INTRAMUSCULAR | Status: DC | PRN
  Administered 2022-12-02 – 2022-12-08 (×4): 4 mg via INTRAVENOUS
  Filled 2022-12-02 (×4): qty 2

## 2022-12-02 NOTE — Progress Notes (Signed)
[facility] PROGRESS NOTE  Leslie [name] (DOB: 11/08/1941) BLT:[id number] PCP: Sonia Side., FNP  Brief Narrative: Leslie [name] is an 81 y.o. female with a history of hypothyroidism, GERD who presented to the ED on 10/29/2022 from PCP's office after finding new AFib with RVR. There was noted to have pneumonia with right pleural effusion treated with antibiotics and thoracentesis (transudate, negative Cx and cytology). Cardiology was consulted, eliquis started, and ultimately got rates under adequate control. On 11/25 she developed right gaze deviation, left hemiparesis and neglect due to right M1 occlusion, taken emergently for mechanical thrombectomy which restored TICI 3 flow. She stabilized, ultimately transferring to CIR 11/30. While undergoing therapy, diltiazem dose was decreased due to orthostatic hypotension. She went recurrently into rapid atrial fibrillation with hypotension prompting readmission on 12/15. Subsequently underwent TEE/DCCV 12/18. Plan was to transfer back to CIR, though she's recurrently in rapid atrial fibrillation again 12/20. Amiodarone gtt initiated. Cardiology managing, perhaps consulting EP.  Subjective: Eating breakfast herself with no complaints early this morning.   Objective: BP (!) 132/95 (BP Location: Right Arm)   Pulse 89   Temp 97.7 F (36.5 C) (Oral)   Resp 18   Ht 5' 7.5" (1.715 m)   Wt 67.3 kg   SpO2 100%   BMI 22.89 kg/m   Gen: No distress, eating breakfast Pulm: Clear, nonlabored  CV: Irreg irreg, no MRG or pitting edema GI: Soft, NT, ND, +BS w/abd binder on Neuro: Alert, conversant, cooperative, stable left mild weakness. No new focal deficits. Ext: Warm, no deformities Skin: No new rashes, lesions or ulcers on visualized skin   Assessment & Plan: Paroxysmal atrial fibrillation with RVR: s/p TEE/DCCV 12/18, reverted NSR > AFib 12/20. Does not tolerate atrial fibrillation well. Options limited with hypotension.  -  Cardiology assistance appreciated. Continue amiodarone with oral loading, '400mg'$  BID then '200mg'$  daily. prn digoxin, not currently required.  - Goal potassium at least 4 and magnesium at least 2 - Continue Eliquis   Delirium: Improving. Ammonia noted to be 36 (ULN is 35). No asterixis on exam, no other hepatic dysfunction or lethargy.  - Continue delirium precautions. Mobilize as much as possible, may go into hallway in [facility]  - Tx possible UTI as below.   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  - Continue anticoagulation, statin.  - Continue PT/OT/SLP once hemodynamically stable. Plan is to return to CIR prior to home. The patient has stabilized to facilitate transfer to CIR though will be delayed as insurance authorization was withdrawn and will need to be restarted after holiday. - New stroke ruled out: Initial concern after brief unresponsive episode with right insular hypodensity on CT 12/21, no acute findings on subsequent MRI/MRA. No seizure seen on EEG. Mentation and neurological status is back to her recent baseline. No syncope.  - Avoid hypotension  UTI:  - Start amoxicillin per discussion with family and culture results (enterococcus)  Orthostatic Hypotension  Hypotension: - Augmented midodrine to avoid hypotension. Attempted to decrease dose previously but remained severely orthostatic BP though notably her symptoms were much better, no severe nausea as is her usual. If not reasonably controlled, ?fludrocortisone  - Continue TED hose/abdominal binder.    HFrEF, tachycardia-mediated cardiomyopathy, severe MR: LVEF 40-45% with global hypokinesis. TEE in setting of AFib with RVR noted interval worsening LVEF to 20%.  - Monitor I/O, weights, BMP.  - MR will make AFib less tolerable. May consider repeat cardioversion after amiodarone load  per discussion with cardiology. - Consider ischemic evaluation.     Vitamin B12 deficiency: B12 low normal. MMA  elevated. - Continue B12 supplementation   COPD: Patient without acute exacerbation.   Hypothyroidism: TSH 11.301.   - Continue levothyroxine (increased dose)   [name], MD [facility] [email or web address] 12/02/2022, 12:04 PM

## 2022-12-02 NOTE — Progress Notes (Signed)
Mobility Specialist - Progress Note   12/02/22 1118  Mobility  Activity Transferred from chair to bed  Level of Assistance Moderate assist, patient does 50-74%  Assistive Device Front wheel walker  Distance Ambulated (ft) 12 ft  Activity Response Tolerated well  Mobility Referral Yes  $Mobility charge 1 Mobility   Pt received in bed eager to get up in wheelchair. No complaints throughout. Pt was returned to chair with all needs met.  Franki Monte  Mobility Specialist Please contact via Solicitor or Rehab office at [phone]

## 2022-12-02 NOTE — Consult Note (Signed)
Palliative Care Consult Note                                  Date: 12/02/2022   Patient Name: Leslie [name]  DOB: 02/11/1941  MRN: [id number]  Age / Sex: 81 y.o., female  PCP: Sonia Side., FNP Referring Physician: [name], MD  Reason for Consultation: Establishing goals of care  HPI/Patient Profile: Palliative Care consult requested for goals of care discussion in this 81 y.o. female  with past medical history of atrial fibrillation, CVA, GERD, and CKD. She was admitted on 11/23/2022 from CIR with hypotension and atrial fibrillation.   Past Medical History:  Diagnosis Date   Arthritis    Back pain due to injury    fell off a ladder and fractured back-T12 down (multiple surgeries w/ onging pain)   Chronic kidney disease    "stage 3 kidney failure" improving per pt   Hypothyroidism    Insomnia    Plantar fasciitis    Stomach ulcer ~2000   Stroke [facility])    TIA X2   2000/01/25, jan 2017   Vertigo, benign paroxysmal, bilateral    Vitamin D deficiency      Subjective:   This NP Osborne Oman reviewed medical records, received report from team, assessed the patient and then met at the patient's bedside with Ms. Yan to discuss diagnosis, prognosis, GOC, EOL wishes disposition and options.  Ms. Schillaci was awake and alert sitting up in bed. No acute distress. Alert and able to engage appropriately in discussions.    Concept of Palliative Care was introduced as specialized medical care for people and their families living with serious illness.  It focuses on providing relief from the symptoms and stress of a serious illness.  The goal is to improve quality of life for both the patient and the family. Values and goals of care important to patient and family were attempted to be elicited.  Created space and opportunity for patient to explore state of health prior to admission, thoughts, and feelings.   Ms. Nyce  shares she lives in the home alone with her 2 Beagles. Widowed 01/25/2000. Her fiance also passed away in 2021-10-24. Her daughter resides in [place] with her 2 grandchildren. 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   Prior to admission Adrean was independent of most ADLs with some ongoing weakness and health challenges. She shares her awareness of health decline.   We discussed Her current illness and what it means in the larger context of Her on-going co-morbidities. Natural disease trajectory and expectations were discussed.  Ms. Whitworth verbalized understanding of current illness and co-morbidities. She is emotional sharing understanding of plan to discharge back to CIR. Yliana is concerned that she may not be able to return home by herself after she has received the maximum level of care. She does not wish to be a burden on her family. States her being hospitalized for as long as she has is taking "a toll" on them and her. Emotional support provided.   Patient is inquiring if she is to the point that she would benefit from SNF and more of a long-term care facility. She is hopeful she can return home but shares she is also realistic knowing she lives alone with very limited  support locally. Her brother is also available however he lives in [place]. She has not discussed with her daughter but plans to do so.   Zurri states she knows that her heart is getting weaker and her health is not the best. She does not wish to suffer but is also not ready to "throw in the towel!"  I discussed the importance of continued conversation with family and their medical providers regarding overall plan of care and treatment options, ensuring decisions are within the context of the patients values and GOCs.  Questions and concerns were addressed.  Patient was encouraged to call with questions or concerns.   PMT will continue to support holistically as needed.   Objective:   Primary Diagnoses: Present on Admission:  Atrial fibrillation with rapid ventricular response ([name])  Hypotension  COPD (chronic obstructive pulmonary disease) ([name])  Hypothyroidism  Paroxysmal A-fib ([name])   Scheduled Meds:  amiodarone  400 mg Oral BID   amoxicillin  500 mg Oral Q8H   apixaban  5 mg Oral BID   vitamin B-12  1,000 mcg Oral Daily   famotidine  20 mg Oral Daily   feeding supplement  237 mL Oral TID BM   Gerhardt's butt cream   Topical BID   levothyroxine  100 mcg Oral Q0600   midodrine  5 mg Oral TID WC   mouth rinse  15 mL Mouth Rinse 4 times per day   rosuvastatin  10 mg Oral Daily   senna-docusate  2 tablet Oral BID    Continuous Infusions:   PRN Meds: acetaminophen, alum & mag hydroxide-simeth, bisacodyl **OR** sorbitol, calcium carbonate, camphor-menthol, guaiFENesin-dextromethorphan, ipratropium-albuterol, melatonin, mouth rinse, prochlorperazine **OR** prochlorperazine **OR** prochlorperazine, sodium phosphate  Allergies  Allergen Reactions   Duloxetine Nausea Only   Voltaren [Diclofenac Sodium] Shortness Of Breath and Palpitations   Lisinopril Other (See Comments)   Aspirin Other (See Comments)    ulcers   Diphenhydramine Other (See Comments)   Gabapentin Itching   Metoprolol     Cold sweat/exhaustion    Ultram [Tramadol Hcl] Other (See Comments)    Effects cognitive abilities    Proton Pump Inhibitors Rash    Itching, rash    Review of Systems  Constitutional:  Positive for fatigue.  Neurological:  Positive for weakness.  Unless otherwise noted, a complete review of systems is negative.  Physical Exam General: NAD Cardiovascular: Irregularly irregular  Pulmonary: clear ant fields, normal breathing pattern  Abdomen: soft, nontender, + bowel sounds Extremities: no edema, no joint deformities Skin: no rashes, warm and dry Neurological: AAO X3  Vital Signs:  BP (!)  132/95 (BP Location: Right Arm)   Pulse 89   Temp 97.7 F (36.5 C) (Oral)   Resp 18   Ht 5' 7.5" (1.715 m)   Wt 67.3 kg   SpO2 100%   BMI 22.89 kg/m  Pain Scale: 0-10   Pain Score: 0-No pain  SpO2: SpO2: 100 % O2 Device:SpO2: 100 % O2 Flow Rate: .O2 Flow Rate (L/min): 2 L/min  IO: Intake/output summary:  Intake/Output Summary (Last 24 hours) at 12/02/2022 1516 Last data filed at 12/02/2022 1200 Gross per 24 hour  Intake 341.78 ml  Output --  Net 341.78 ml    LBM: Last BM Date : 12/01/22 Baseline Weight: Weight: 71.5 kg Most recent weight: Weight: 67.3 kg      Palliative Assessment/Data:    Advanced Care Planning:   Primary Decision Maker: PATIENT  Code Status/Advance Care Planning: Full code  A discussion was had today regarding advanced directives. Concepts specific to code status, artifical feeding and hydration, continued IV antibiotics and rehospitalization was had.  The difference between a aggressive medical intervention path and a palliative comfort care path was discussed.   Meena shares she has a completed advanced directive. Her daughter, [name] is her Scientist, research (medical). Patient expresses wishes to remain a full code sharing previous experiences with her parents and other family members. I attempted to discuss concerns from previous experiences. She expressed appreciation. States fear of health decline. I empathetically discussed what code status would look like for patient in the event of a cardiopulmonary event. She verbalized understanding acknowledging she needs time to think about her decisions now that we have had detailed discussions.   Assessment & Plan:   SUMMARY OF RECOMMENDATIONS   Full Code-as confirmed by patient. She wishes to have some time to think about DNR/DNI and possibly discuss with her daughter.  Continue with current plan of care per medical team.  Extensive goals of care discussions. Charon is clear in expressed wishes to  continue to treat the treatable allowing her every opportunity to thrive. She is taking things one day at a time with awareness her health is declining. She is appropriately concerned about the future and not suffering or causing her family distress.  PMT will continue to support and follow as needed. Please call team line with urgent needs.  Symptom Management:  Per Attending/See below  Palliative Prophylaxis:  Bowel Regimen and Frequent Pain Assessment  Additional Recommendations (Limitations, Scope, Preferences): Full Scope Treatment  Psycho-social/Spiritual:  Desire for further Chaplaincy support: no Additional Recommendations:  ongoing goals of care discussion   Prognosis:  Unable to determine  Discharge Planning:  To Be Determined    Patient expressed understanding and was in agreement with this plan.   Time Total: 50 min   Visit consisted of counseling and education dealing with the complex and emotionally intense issues of symptom management and palliative care in the setting of serious and potentially life-threatening illness.Greater than 50%  of this time was spent counseling and coordinating care related to the above assessment and plan.  Signed by:  Alda Lea, AGPCNP-BC [place]   Phone: [phone] Pager: [phone] Amion: Bjorn Pippin   Thank you for allowing the Palliative Medicine Team to assist in the care of this patient. Please utilize secure chat with additional questions, if there is no response within 30 minutes please call the above phone number. Palliative Medicine Team providers are available by phone from 7am to 5pm daily and can be reached through the team cell phone.  Should this patient require assistance outside of these hours, please call the patient's attending physician.

## 2022-12-02 NOTE — Progress Notes (Signed)
Rounding Note    Patient Name: Leslie [name] Date of Encounter: 12/02/2022  [place] Cardiologist: Peter Martinique, MD   Subjective   HR improved to 80-100s today still in Afib Midodrine increased yesterday due to profound orthostatsis but dose subsequently decreased back to 2.5 mg po TID  Inpatient Medications    Scheduled Meds:  amiodarone  400 mg Oral BID   amoxicillin  500 mg Oral Q8H   apixaban  5 mg Oral BID   vitamin B-12  1,000 mcg Oral Daily   famotidine  20 mg Oral Daily   feeding supplement  237 mL Oral TID BM   Gerhardt's butt cream   Topical BID   levothyroxine  100 mcg Oral Q0600   midodrine  5 mg Oral TID WC   mouth rinse  15 mL Mouth Rinse 4 times per day   rosuvastatin  10 mg Oral Daily   senna-docusate  2 tablet Oral BID   Continuous Infusions:    PRN Meds: acetaminophen, alum & mag hydroxide-simeth, bisacodyl **OR** sorbitol, calcium carbonate, camphor-menthol, guaiFENesin-dextromethorphan, ipratropium-albuterol, melatonin, mouth rinse, prochlorperazine **OR** prochlorperazine **OR** prochlorperazine, sodium phosphate   Vital Signs    Vitals:   12/01/22 1255 12/01/22 2110 12/02/22 0524 12/02/22 0845  BP: 105/64 136/85 137/89 (!) 132/95  Pulse: 91 87 88 89  Resp: '18 16 16 18  '$ Temp: 98 F (36.7 C) 98.2 F (36.8 C) 97.6 F (36.4 C) 97.7 F (36.5 C)  TempSrc: Oral Oral Oral Oral  SpO2: 96% 98% 98% 100%  Weight:   67.3 kg   Height:       No intake or output data in the 24 hours ending 12/02/22 0910     12/02/2022    5:24 AM 12/01/2022    5:08 AM 11/30/2022    5:30 AM  Last 3 Weights  Weight (lbs) 148 lb 5.9 oz 148 lb 13 oz 147 lb 11.3 oz  Weight (kg) 67.3 kg 67.5 kg 67 kg      Telemetry    Afib  80-100s- Personally Reviewed  ECG    No new tracing- Personally Reviewed  Physical Exam   GEN: Sitting up in bed, comfortable Neck: No JVD Cardiac: Irregularly irregular, systolic apical murmur Respiratory: Clear  bilaterally GI: Soft, nontender, non-distended  MS: No edema; No deformity. Neuro:  Nonfocal  Psych: Normal affect   Labs    High Sensitivity Troponin:   Recent Labs  Lab 11/29/22 0241 11/29/22 0551  TROPONINIHS 8 8     Chemistry Recent Labs  Lab 11/29/22 0241 11/30/22 0700 11/30/22 0940 12/01/22 0138  NA 139 131* 138 140  K 3.4* 3.3* 3.7 4.5  CL 108 86* 105 108  CO2 23 20* 24 25  GLUCOSE 118* 719* 113* 96  BUN '9 9 10 11  '$ CREATININE 1.15* 1.14* 1.14* 1.13*  CALCIUM 9.6 8.2* 9.7 9.6  MG 2.0  --   --   --   GFRNONAA 48* 49* 49* 49*  ANIONGAP 8 25* 9 7    Lipids No results for input(s): "CHOL", "TRIG", "HDL", "LABVLDL", "LDLCALC", "CHOLHDL" in the last 168 hours.  Hematology Recent Labs  Lab 11/26/22 0608 11/29/22 0241 11/30/22 0700 12/01/22 0138  WBC 10.0 10.8* 9.7  --   RBC 4.51 4.69 4.12  --   HGB 12.8 13.0 11.5* 13.2  HCT 39.1 40.9 38.1 42.2  MCV 86.7 87.2 92.5  --   MCH 28.4 27.7 27.9  --   MCHC 32.7 31.8  30.2  --   RDW 15.7* 15.8* 15.9*  --   PLT 208 204 155  --    Thyroid  No results for input(s): "TSH", "FREET4" in the last 168 hours.   BNPNo results for input(s): "BNP", "PROBNP" in the last 168 hours.  DDimer No results for input(s): "DDIMER" in the last 168 hours.   Radiology    No results found.  Cardiac Studies  TEE 11/26/22: IMPRESSIONS     1. Left ventricular ejection fraction, by estimation, is 20%. The left  ventricle has severely decreased function.   2. Right ventricular systolic function is severely reduced. The right  ventricular size is mildly enlarged.   3. Left atrial size was moderately dilated. No left atrial/left atrial  appendage thrombus was detected. The LAA emptying velocity was 35 cm/s.   4. A small pericardial effusion is present.   5. The mitral valve is grossly normal. Severe mitral valve regurgitation,  likely secondary to LV dysfunction and atrial functional MR. Multiple  jets, appears commissural.   6. The  aortic valve is grossly normal. Aortic valve regurgitation is  mild.   7. There is mild (Grade II) atheroma plaque involving the aortic arch and  descending aorta.   Conclusion(s)/Recommendation(s): No LA/LAA thrombus identified. Successful  cardioversion performed with restoration of normal sinus rhythm.    Patient Profile     81 y.o. female with a history of paroxysmal atrial fibrillation on Eliquis, chronic systolic CHF with EF of 70-17%, mitral regurgitation, CKD stage III, hypothyroidism, GERD, and multiple CVA with recent stroke on 11/03/2022 s/p thrombectomy. Patient was recently admitted from 10/29/2022 to 11/08/2022 for new onset atrial fibrillation and was found to have a reduced EF of 40-45%. Hospitalization was complicated by community acquired pneumonia (which was initially diagnosed prior to this admission) with right pleural effusion s/p thoracentesis on 10/30/2022, and acute stroke requiring thrombectomy. She was discharged to CIR on 11/08/2022. She went back into rapid atrial fibrillation with rates in the 150s on 11/23/2022 and was hypotensive with it. Therefore, she was readmitted as an inpatient and Cardiology consulted for assistance.   Assessment & Plan     #Persistent Atrial Fibrillation:  Patient with recent admission for stroke found to have new onset atrial fibrillation. Echo showed LVEF of 40-45%. She was started on Diltiazem and apixaban. She was discharged to CIR but represented on this admission for recurrent rapid atrial fibrillation with rates in the 150s with associated hypotension for which Cardiology was consulted. Given difficulty with rate control and soft blood pressures limiting nodal agents, she underwent TEE/DCCV on 11/26/22 with initial return to NSR but developed recurrent RVR on 12/20 now back on amiodarone gtt. - S/p successful TEE/DCCV on 11/26/22 but developed recurrent RVR on 12/20 - HR improved; amiodarone 400 mg BID for 4 total days to complete  loading, then 200 mg daily going forward. - Likely will need amiodarone long term  - Can consider low dose dig as needed but would need to be careful given renal function, no additional dosing required at this time given adequate rate control.  - Cannot tolerate nodal agents due to significant orthostatic hypotension - Continue Eliquis '5mg'$  BID - consider repeat cardioversion next week vs as outpatient, she will benefit from SR.   #Episode of decreased responsiveness: #Recent MCA CVA Patient had episode of decreased responsiveness with nonsensical speech for a brief period. Underwent CT head which showed new hypodensity in the right insular region. Follow-up brain MRI fortunately with no  acute finding. Had progression of right MCA territory infract but was subacute to chronic. MRA with no recurrent occlusion or proximal flow limiting stenosis. -Management per neuro and primary  #Chronic Systolic CHF:  Echo during recent admission showed LVEF of 40-45% with global hypokinesis , moderate asymmetric LVH of the basal-septal segment and moderate to severe MR. TEE on 11/26/22 with interval drop in EF to 20% in the setting of Afib with RVR. Currently euvolemic on exam. On amiodarone for recurrent RVR. -TEE with interval drop in EF to 20% in the setting of Afib with RVR -Unable to tolerate GDMT due to orthostatic hypotension  - repeat TTE shows similarly depressed EF, likely 25-30%. Needs rate and rhythm control as much as possible.    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 Dilt stopped and midodrine started. Continues to have significant orthostasis limiting medical therapy. -Continue midodrine '5mg'$  TID - this was decreased by primary service to 2.5 mg TID. -Continue compression socks and abdominal binder   #Severe MR: Severe on TEE. Likely functional + atrial functional. Will plan to repeat TTE once back in  NSR or once better rate controlled.   For questions or updates, please contact [place] Please consult [email or web address] for contact info under        Signed, Elouise Munroe, MD  12/02/2022, 9:10 AM

## 2022-12-02 NOTE — Progress Notes (Signed)
Pt is c/o nausea, feeling hot and upper mid abdominal pain. Dr. Bonner Puna made aware. New orders received. See MAR.

## 2022-12-03 NOTE — Progress Notes (Addendum)
[facility] PROGRESS NOTE  Leslie [name] (DOB: 1941/09/17) LOV:[id number] PCP: Sonia Side., FNP  Brief Narrative: Leslie [name] is an 81 y.o. female with a history of hypothyroidism, GERD who presented to the ED on 10/29/2022 from PCP's office after finding new AFib with RVR. There was noted to have pneumonia with right pleural effusion treated with antibiotics and thoracentesis (transudate, negative Cx and cytology). Cardiology was consulted, eliquis started, and ultimately got rates under adequate control. On 11/25 she developed right gaze deviation, left hemiparesis and neglect due to right M1 occlusion, taken emergently for mechanical thrombectomy which restored TICI 3 flow. She stabilized, ultimately transferring to CIR 11/30. While undergoing therapy, diltiazem dose was decreased due to orthostatic hypotension. She went recurrently into rapid atrial fibrillation with hypotension prompting readmission on 12/15. Subsequently underwent TEE/DCCV 12/18. Plan was to transfer back to CIR, though she's recurrently in rapid atrial fibrillation again 12/20. Amiodarone gtt initiated. Cardiology managing, perhaps consulting EP.  Subjective: Had nausea last night improved with zofran after compazine. Associate dyspnea though no hypoxia. Went more clearly into AFlutter though remains rate controlled. No complaints this morning. Spoke with palliative care yesterday.   Objective: BP (!) 132/98 (BP Location: Right Arm)   Pulse 80   Temp 97.6 F (36.4 C) (Oral)   Resp 17   Ht 5' 7.5" (1.715 m)   Wt 67 kg   SpO2 100%   BMI 22.79 kg/m   Gen: No distress laying flat Pulm: Clear, nonlabored CV: Regular with 3:1 conduction, rate ~80, no MRG or JVD laying flat.  GI: Soft, NT, ND, +BS  Neuro: Alert and oriented. No new focal deficits. Ext: Warm, no deformities Skin: No new rashes, lesions or ulcers on visualized skin   Assessment & Plan: Paroxysmal atrial fibrillation/flutter with  RVR: s/p TEE/DCCV 12/18, reverted NSR > AFib 12/20. Does not tolerate atrial fibrillation well. Options limited with hypotension.  - Wonder if DCCV would be helpful for her.  - Cardiology assistance appreciated. Continue amiodarone with oral loading, '400mg'$  BID then '200mg'$  daily. prn digoxin, not currently required.  - Goal potassium at least 4 and magnesium at least 2. Recheck in AM - Continue Eliquis   Delirium: Improving. Ammonia noted to be 36 (ULN is 35). No asterixis on exam, no other hepatic dysfunction or lethargy.  - Continue delirium precautions. Mobilize as much as possible, may go into hallway in [place].  - Tx possible UTI as below.   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  - Continue anticoagulation, statin.  - Continue PT/OT/SLP once hemodynamically stable. Plan is to return to CIR prior to home. The patient has stabilized to facilitate transfer to CIR though will be delayed as insurance authorization was withdrawn and will need to be restarted after holiday. - New stroke ruled out: Initial concern after brief unresponsive episode with right insular hypodensity on CT 12/21, no acute findings on subsequent MRI/MRA. No seizure seen on EEG. Mentation and neurological status is back to her recent baseline. No syncope.  - Avoid hypotension  UTI:  - Start amoxicillin per discussion with family and culture results (enterococcus)  Orthostatic Hypotension  Hypotension: - Augmented midodrine to avoid hypotension. Attempted to decrease dose previously but remained severely orthostatic BP though notably her symptoms were much better, no severe nausea as is her usual. If not reasonably controlled, ?fludrocortisone  - Continue TED hose/abdominal binder.  - Suspect nausea related to CNS insult, hypotension/dysrhythmia. Improved symptoms and  no angina.    HFrEF, tachycardia-mediated cardiomyopathy, severe MR: LVEF 40-45% with global hypokinesis. TEE in setting of  AFib with RVR noted interval worsening LVEF to 20%.  - Monitor I/O, weights, BMP.  - MR will make AFib less tolerable. May consider repeat cardioversion after amiodarone load per discussion with cardiology. - Consider ischemic evaluation.     Vitamin B12 deficiency: B12 low normal. MMA elevated. - Continue B12 supplementation   COPD: Patient without acute exacerbation.   Hypothyroidism: TSH 11.301.   - Continue levothyroxine (increased dose)   [name], MD [facility] [email or web address] 12/03/2022, 9:51 AM

## 2022-12-03 NOTE — Progress Notes (Signed)
Mobility Specialist - Progress Note   12/03/22 1131  Mobility  Activity Transferred from bed to chair  Level of Assistance Moderate assist, patient does 50-74%  Assistive Device Front wheel walker  Distance Ambulated (ft) 3 ft  Activity Response Tolerated fair (HR increased)  Mobility Referral Yes  $Mobility charge 1 Mobility    Pt received in bed agreeable to mobility. ModA to stand and pivot, cues needed for safety using RW. Left in recliner w/ chair alarm on and call bell in reach.    [place] Specialist Please contact via SecureChat or Rehab office at [phone]

## 2022-12-03 NOTE — Progress Notes (Signed)
Rounding Note    Patient Name: Leslie [name] Date of Encounter: 12/03/2022  [facility] Cardiologist: Peter Martinique, MD   Subjective   Nausea overnight. Rate controlled atrial flutter today.  Inpatient Medications    Scheduled Meds:  amiodarone  400 mg Oral BID   amoxicillin  500 mg Oral Q8H   apixaban  5 mg Oral BID   vitamin B-12  1,000 mcg Oral Daily   famotidine  20 mg Oral Daily   feeding supplement  237 mL Oral TID BM   Gerhardt's butt cream   Topical BID   levothyroxine  100 mcg Oral Q0600   midodrine  5 mg Oral TID WC   mouth rinse  15 mL Mouth Rinse 4 times per day   rosuvastatin  10 mg Oral Daily   senna-docusate  2 tablet Oral BID   Continuous Infusions:    PRN Meds: acetaminophen, alum & mag hydroxide-simeth, bisacodyl **OR** sorbitol, calcium carbonate, camphor-menthol, guaiFENesin-dextromethorphan, ipratropium-albuterol, melatonin, ondansetron (ZOFRAN) IV, mouth rinse, prochlorperazine **OR** prochlorperazine **OR** prochlorperazine, sodium phosphate   Vital Signs    Vitals:   12/02/22 1723 12/02/22 1841 12/02/22 2054 12/03/22 0506  BP:  136/80 (!) 145/99 (!) 132/98  Pulse:  85 87 80  Resp: '20 20 16 17  '$ Temp:   97.6 F (36.4 C) 97.6 F (36.4 C)  TempSrc:   Oral Oral  SpO2:  98% 100% 100%  Weight:      Height:        Intake/Output Summary (Last 24 hours) at 12/03/2022 0619 Last data filed at 12/03/2022 0507 Gross per 24 hour  Intake 721.78 ml  Output 850 ml  Net -128.22 ml       12/02/2022    5:24 AM 12/01/2022    5:08 AM 11/30/2022    5:30 AM  Last 3 Weights  Weight (lbs) 148 lb 5.9 oz 148 lb 13 oz 147 lb 11.3 oz  Weight (kg) 67.3 kg 67.5 kg 67 kg      Telemetry    Aflutter  80-100s- Personally Reviewed  ECG    No new tracing- Personally Reviewed  Physical Exam   GEN: Sitting up in bed, back pain Neck: No JVD Cardiac: Irregularly irregular, systolic apical murmur Respiratory: Clear bilaterally GI:  Soft, nontender, non-distended  MS: No edema; No deformity. Neuro:  Nonfocal  Psych: Normal affect   Labs    High Sensitivity Troponin:   Recent Labs  Lab 11/29/22 0241 11/29/22 0551  TROPONINIHS 8 8     Chemistry Recent Labs  Lab 11/29/22 0241 11/30/22 0700 11/30/22 0940 12/01/22 0138  NA 139 131* 138 140  K 3.4* 3.3* 3.7 4.5  CL 108 86* 105 108  CO2 23 20* 24 25  GLUCOSE 118* 719* 113* 96  BUN '9 9 10 11  '$ CREATININE 1.15* 1.14* 1.14* 1.13*  CALCIUM 9.6 8.2* 9.7 9.6  MG 2.0  --   --   --   GFRNONAA 48* 49* 49* 49*  ANIONGAP 8 25* 9 7    Lipids No results for input(s): "CHOL", "TRIG", "HDL", "LABVLDL", "LDLCALC", "CHOLHDL" in the last 168 hours.  Hematology Recent Labs  Lab 11/29/22 0241 11/30/22 0700 12/01/22 0138  WBC 10.8* 9.7  --   RBC 4.69 4.12  --   HGB 13.0 11.5* 13.2  HCT 40.9 38.1 42.2  MCV 87.2 92.5  --   MCH 27.7 27.9  --   MCHC 31.8 30.2  --   RDW 15.8* 15.9*  --  PLT 204 155  --    Thyroid  No results for input(s): "TSH", "FREET4" in the last 168 hours.   BNPNo results for input(s): "BNP", "PROBNP" in the last 168 hours.  DDimer No results for input(s): "DDIMER" in the last 168 hours.   Radiology    No results found.  Cardiac Studies  TEE 11/26/22: IMPRESSIONS     1. Left ventricular ejection fraction, by estimation, is 20%. The left  ventricle has severely decreased function.   2. Right ventricular systolic function is severely reduced. The right  ventricular size is mildly enlarged.   3. Left atrial size was moderately dilated. No left atrial/left atrial  appendage thrombus was detected. The LAA emptying velocity was 35 cm/s.   4. A small pericardial effusion is present.   5. The mitral valve is grossly normal. Severe mitral valve regurgitation,  likely secondary to LV dysfunction and atrial functional MR. Multiple  jets, appears commissural.   6. The aortic valve is grossly normal. Aortic valve regurgitation is  mild.   7.  There is mild (Grade II) atheroma plaque involving the aortic arch and  descending aorta.   Conclusion(s)/Recommendation(s): No LA/LAA thrombus identified. Successful  cardioversion performed with restoration of normal sinus rhythm.    Patient Profile     81 y.o. female with a history of paroxysmal atrial fibrillation on Eliquis, chronic systolic CHF with EF of 30-07%, mitral regurgitation, CKD stage III, hypothyroidism, GERD, and multiple CVA with recent stroke on 11/03/2022 s/p thrombectomy. Patient was recently admitted from 10/29/2022 to 11/08/2022 for new onset atrial fibrillation and was found to have a reduced EF of 40-45%. Hospitalization was complicated by community acquired pneumonia (which was initially diagnosed prior to this admission) with right pleural effusion s/p thoracentesis on 10/30/2022, and acute stroke requiring thrombectomy. She was discharged to CIR on 11/08/2022. She went back into rapid atrial fibrillation with rates in the 150s on 11/23/2022 and was hypotensive with it. Therefore, she was readmitted as an inpatient and Cardiology consulted for assistance.   Assessment & Plan     #Persistent Atrial Fibrillation:  Patient with recent admission for stroke found to have new onset atrial fibrillation. Echo showed LVEF of 40-45%. She was started on Diltiazem and apixaban. She was discharged to CIR but represented on this admission for recurrent rapid atrial fibrillation with rates in the 150s with associated hypotension for which Cardiology was consulted. Given difficulty with rate control and soft blood pressures limiting nodal agents, she underwent TEE/DCCV on 11/26/22 with initial return to NSR but developed recurrent RVR on 12/20 now back on amiodarone gtt. - S/p successful TEE/DCCV on 11/26/22 but developed recurrent RVR on 12/20 - HR improved; amiodarone 400 mg BID for 4 total days to complete loading, then 200 mg daily going forward. - Likely will need amiodarone long  term  - Can consider low dose dig as needed but would need to be careful given renal function, no additional dosing required at this time given adequate rate control.  - Cannot tolerate nodal agents due to significant orthostatic hypotension - Continue Eliquis '5mg'$  BID - will arrange for cardioversion 12/26 or 12/27 pending availability, overall stable. Will benefit from restoration of SR.  #Episode of decreased responsiveness: #Recent MCA CVA Patient had episode of decreased responsiveness with nonsensical speech for a brief period. Underwent CT head which showed new hypodensity in the right insular region. Follow-up brain MRI fortunately with no acute finding. Had progression of right MCA territory infract but was  subacute to chronic. MRA with no recurrent occlusion or proximal flow limiting stenosis. -Management per neuro and primary  #Chronic Systolic CHF:  Echo during recent admission showed LVEF of 40-45% with global hypokinesis , moderate asymmetric LVH of the basal-septal segment and moderate to severe MR. TEE on 11/26/22 with interval drop in EF to 20% in the setting of Afib with RVR. Currently euvolemic on exam. On amiodarone for recurrent RVR. -TEE with interval drop in EF to 20% in the setting of Afib with RVR -Unable to tolerate GDMT due to orthostatic hypotension  - repeat TTE shows similarly depressed EF, likely 25-30%. Needs rate and rhythm control as much as possible.    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 Dilt stopped and midodrine started. Continues to have significant orthostasis limiting medical therapy. -Continue midodrine '5mg'$  TID -Continue compression socks and abdominal binder   #Severe MR: Severe on TEE. Likely functional + atrial functional. Will plan to repeat TTE once back in NSR or once better rate controlled.   For questions or updates, please contact [place] Please consult [email or web address] for contact info under        Signed, Elouise Munroe, MD  12/03/2022, 6:19 AM

## 2022-12-04 LAB — PROTIME-INR
INR: 1.4 — ABNORMAL HIGH (ref 0.8–1.2)
Prothrombin Time: 17 seconds — ABNORMAL HIGH (ref 11.4–15.2)

## 2022-12-04 MED ORDER — SODIUM CHLORIDE 0.9 % IV SOLN: INTRAVENOUS | Status: DC

## 2022-12-04 MED ORDER — AMIODARONE HCL 200 MG PO TABS
200.0000 mg | ORAL_TABLET | Freq: Every day | ORAL | Status: DC
  Administered 2022-12-05 – 2022-12-10 (×6): 200 mg via ORAL
  Filled 2022-12-04 (×6): qty 1

## 2022-12-04 NOTE — Progress Notes (Signed)
Mobility Specialist - Progress Note   12/04/22 1105  Mobility  Activity Transferred from bed to chair  Level of Assistance Minimal assist, patient does 75% or more  Assistive Device Front wheel walker  Activity Response Tolerated well  Mobility Referral Yes  $Mobility charge 1 Mobility   Pt was received in bed and agreeable to mobility. Pt was MinA to stand and pivot to chair. Pt required multiple vocal ques during transfer. Pt was left in chair with all needs met and chair alarm on. Daughter present.   Franki Monte  Mobility Specialist Please contact via Solicitor or Rehab office at [phone]

## 2022-12-04 NOTE — Progress Notes (Signed)
Inpatient Rehab Admissions Coordinator:    Patient needs updated therapy notes to begin authorization process for potential CIR admission. Secure chat sent to rehab this am. Will await updated notes and submit to insurance when available.   Rehab Admissons Coordinator [place], [place], [place] [phone]

## 2022-12-04 NOTE — Progress Notes (Signed)
Rounding Note    Patient Name: Leslie [name] Date of Encounter: 12/04/2022  [place] Cardiologist: Peter Martinique, MD   Subjective   Atrial flutter continues, rate controlled.  Inpatient Medications    Scheduled Meds:  amiodarone  400 mg Oral BID   amoxicillin  500 mg Oral Q8H   apixaban  5 mg Oral BID   vitamin B-12  1,000 mcg Oral Daily   famotidine  20 mg Oral Daily   feeding supplement  237 mL Oral TID BM   Gerhardt's butt cream   Topical BID   levothyroxine  100 mcg Oral Q0600   midodrine  5 mg Oral TID WC   mouth rinse  15 mL Mouth Rinse 4 times per day   rosuvastatin  10 mg Oral Daily   senna-docusate  2 tablet Oral BID   Continuous Infusions:  sodium chloride 20 mL/hr at 12/04/22 0601     PRN Meds: acetaminophen, alum & mag hydroxide-simeth, bisacodyl **OR** sorbitol, calcium carbonate, camphor-menthol, guaiFENesin-dextromethorphan, ipratropium-albuterol, melatonin, ondansetron (ZOFRAN) IV, mouth rinse, prochlorperazine **OR** prochlorperazine **OR** prochlorperazine, sodium phosphate   Vital Signs    Vitals:   12/03/22 2206 12/03/22 2310 12/04/22 0450 12/04/22 0800  BP: (!) 134/100 (!) 152/76 119/67 120/84  Pulse:   93   Resp: 17 (!) '26 18 10  '$ Temp:   98.3 F (36.8 C) 98 F (36.7 C)  TempSrc:   Axillary Oral  SpO2:   100%   Weight:   66.1 kg   Height:        Intake/Output Summary (Last 24 hours) at 12/04/2022 0815 Last data filed at 12/04/2022 0452 Gross per 24 hour  Intake 720 ml  Output 220 ml  Net 500 ml       12/04/2022    4:50 AM 12/03/2022    5:06 AM 12/02/2022    5:24 AM  Last 3 Weights  Weight (lbs) 145 lb 12.8 oz 147 lb 11.3 oz 148 lb 5.9 oz  Weight (kg) 66.134 kg 67 kg 67.3 kg      Telemetry    Aflutter  80-100s- Personally Reviewed  ECG    No new tracing- Personally Reviewed  Physical Exam   GEN: Sitting up in bed, back pain Neck: No JVD Cardiac: Irregularly irregular, systolic apical  murmur Respiratory: Clear bilaterally GI: Soft, nontender, non-distended  MS: No edema; No deformity. Neuro:  Nonfocal  Psych: Normal affect   Labs    High Sensitivity Troponin:   Recent Labs  Lab 11/29/22 0241 11/29/22 0551  TROPONINIHS 8 8     Chemistry Recent Labs  Lab 11/29/22 0241 11/30/22 0700 11/30/22 0940 12/01/22 0138  NA 139 131* 138 140  K 3.4* 3.3* 3.7 4.5  CL 108 86* 105 108  CO2 23 20* 24 25  GLUCOSE 118* 719* 113* 96  BUN '9 9 10 11  '$ CREATININE 1.15* 1.14* 1.14* 1.13*  CALCIUM 9.6 8.2* 9.7 9.6  MG 2.0  --   --   --   GFRNONAA 48* 49* 49* 49*  ANIONGAP 8 25* 9 7    Lipids No results for input(s): "CHOL", "TRIG", "HDL", "LABVLDL", "LDLCALC", "CHOLHDL" in the last 168 hours.  Hematology Recent Labs  Lab 11/29/22 0241 11/30/22 0700 12/01/22 0138  WBC 10.8* 9.7  --   RBC 4.69 4.12  --   HGB 13.0 11.5* 13.2  HCT 40.9 38.1 42.2  MCV 87.2 92.5  --   MCH 27.7 27.9  --   MCHC  31.8 30.2  --   RDW 15.8* 15.9*  --   PLT 204 155  --    Thyroid  No results for input(s): "TSH", "FREET4" in the last 168 hours.   BNPNo results for input(s): "BNP", "PROBNP" in the last 168 hours.  DDimer No results for input(s): "DDIMER" in the last 168 hours.   Radiology    No results found.  Cardiac Studies  TEE 11/26/22: IMPRESSIONS     1. Left ventricular ejection fraction, by estimation, is 20%. The left  ventricle has severely decreased function.   2. Right ventricular systolic function is severely reduced. The right  ventricular size is mildly enlarged.   3. Left atrial size was moderately dilated. No left atrial/left atrial  appendage thrombus was detected. The LAA emptying velocity was 35 cm/s.   4. A small pericardial effusion is present.   5. The mitral valve is grossly normal. Severe mitral valve regurgitation,  likely secondary to LV dysfunction and atrial functional MR. Multiple  jets, appears commissural.   6. The aortic valve is grossly normal.  Aortic valve regurgitation is  mild.   7. There is mild (Grade II) atheroma plaque involving the aortic arch and  descending aorta.   Conclusion(s)/Recommendation(s): No LA/LAA thrombus identified. Successful  cardioversion performed with restoration of normal sinus rhythm.    Patient Profile     81 y.o. female with a history of paroxysmal atrial fibrillation on Eliquis, chronic systolic CHF with EF of 88-41%, mitral regurgitation, CKD stage III, hypothyroidism, GERD, and multiple CVA with recent stroke on 11/03/2022 s/p thrombectomy. Patient was recently admitted from 10/29/2022 to 11/08/2022 for new onset atrial fibrillation and was found to have a reduced EF of 40-45%. Hospitalization was complicated by community acquired pneumonia (which was initially diagnosed prior to this admission) with right pleural effusion s/p thoracentesis on 10/30/2022, and acute stroke requiring thrombectomy. She was discharged to CIR on 11/08/2022. She went back into rapid atrial fibrillation with rates in the 150s on 11/23/2022 and was hypotensive with it. Therefore, she was readmitted as an inpatient and Cardiology consulted for assistance.   Assessment & Plan     #Persistent Atrial Fibrillation:  Patient with recent admission for stroke found to have new onset atrial fibrillation. Echo showed LVEF of 40-45%. She was started on Diltiazem and apixaban. She was discharged to CIR but represented on this admission for recurrent rapid atrial fibrillation with rates in the 150s with associated hypotension for which Cardiology was consulted. Given difficulty with rate control and soft blood pressures limiting nodal agents, she underwent TEE/DCCV on 11/26/22 with initial return to NSR but developed recurrent RVR on 12/20 now back on amiodarone gtt. - S/p successful TEE/DCCV on 11/26/22 but developed recurrent RVR on 12/20 - HR improved; amiodarone 400 mg BID for 4 total days to complete loading, then 200 mg daily going  forward. - Likely will need amiodarone long term  - Can consider low dose dig as needed but would need to be careful given renal function, no additional dosing required at this time given adequate rate control.  - Cannot tolerate nodal agents due to significant orthostatic hypotension - Continue Eliquis '5mg'$  BID - will arrange for cardioversion 12/27 pending availability, overall stable. Will benefit from restoration of SR. Informed consent: Discussed 12/24 with family present: Risks, benefits and alternatives of direct current cardioversion reviewed including potential for post-cardioversion rhythms, especially life-threatening arrhythmias (ventricular tachycardia and fibrillation, profound bradycardia). Major complications may include serious or fatal arrhythmias, myocardial  damage, and acute pulmonary edema; minor complications include skin burns and transient hypotension. Benefits include restoration of sinus rhythm. Alternatives to treatment were discussed, questions were answered. Patient is willing to proceed.    #Episode of decreased responsiveness: #Recent MCA CVA Patient had episode of decreased responsiveness with nonsensical speech for a brief period. Underwent CT head which showed new hypodensity in the right insular region. Follow-up brain MRI fortunately with no acute finding. Had progression of right MCA territory infract but was subacute to chronic. MRA with no recurrent occlusion or proximal flow limiting stenosis. -Management per neuro and primary  #Chronic Systolic CHF:  Echo during recent admission showed LVEF of 40-45% with global hypokinesis , moderate asymmetric LVH of the basal-septal segment and moderate to severe MR. TEE on 11/26/22 with interval drop in EF to 20% in the setting of Afib with RVR. Currently euvolemic on exam. On amiodarone for recurrent RVR. -TEE with interval drop in EF to 20% in the setting of Afib with RVR -Unable to tolerate GDMT due to orthostatic  hypotension  - repeat TTE shows similarly depressed EF, likely 25-30%. Needs rate and rhythm control as much as possible.    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 Dilt stopped and midodrine started. Continues to have significant orthostasis limiting medical therapy. -Continue midodrine '5mg'$  TID -Continue compression socks and abdominal binder   #Severe MR: Severe on TEE. Likely functional + atrial functional. Will plan to repeat TTE once back in NSR or once better rate controlled.   For questions or updates, please contact [place] Please consult [email or web address] for contact info under        Signed, Elouise Munroe, MD  12/04/2022, 8:15 AM

## 2022-12-04 NOTE — Progress Notes (Signed)
Mobility Specialist - Progress Note   12/04/22 1426  Mobility  Activity Transferred from bed to chair  Level of Assistance +2 (takes two people) (for safety)  Assistive Device Front wheel walker  Activity Response Tolerated fair  Mobility Referral Yes  $Mobility charge 1 Mobility   Pt was received at end of bed trying to get up. Pt stating that her back was killing her and she was trying to get comfortable. Pt requesting assistance to chair. +2 for safety. Pt was left in chair with all needs met and chair alarm on. RN aware.   Franki Monte  Mobility Specialist Please contact via Solicitor or Rehab office at [phone]

## 2022-12-04 NOTE — Anesthesia Preprocedure Evaluation (Addendum)
Anesthesia Evaluation  Patient identified by MRN, date of birth, ID band Patient awake    Reviewed: Allergy & Precautions, NPO status , Patient's Chart, lab work & pertinent test results  History of Anesthesia Complications Negative for: history of anesthetic complications  Airway Mallampati: II  TM Distance: >3 FB Neck ROM: Full    Dental   Pulmonary COPD, former smoker   Pulmonary exam normal        Cardiovascular +CHF  + dysrhythmias Atrial Fibrillation  Rhythm:Irregular  Echo 11/28/22: EF 40-45%, global hypokinesis, mild asymmetric LVH, mild-mod MR TEE 11/26/22: EF 20%, severe MR   Neuro/Psych TIACVA    GI/Hepatic Neg liver ROS, PUD,,,  Endo/Other  Hypothyroidism    Renal/GU CRFRenal disease  negative genitourinary   Musculoskeletal  (+) Arthritis ,    Abdominal   Peds  Hematology negative hematology ROS (+)   Anesthesia Other Findings   Reproductive/Obstetrics                              Anesthesia Physical Anesthesia Plan  ASA: 4  Anesthesia Plan: General   Post-op Pain Management: Minimal or no pain anticipated   Induction: Intravenous  PONV Risk Score and Plan: 3 and TIVA and Treatment may vary due to age or medical condition  Airway Management Planned: Mask  Additional Equipment: None  Intra-op Plan:   Post-operative Plan:   Informed Consent: I have reviewed the patients History and Physical, chart, labs and discussed the procedure including the risks, benefits and alternatives for the proposed anesthesia with the patient or authorized representative who has indicated his/her understanding and acceptance.       Plan Discussed with:   Anesthesia Plan Comments:         Anesthesia Quick Evaluation

## 2022-12-04 NOTE — Progress Notes (Signed)
[facility] PROGRESS NOTE  Leslie [name] (DOB: [birth year]) XTA:[id number] PCP: Sonia Side., FNP  Brief Narrative: Leslie [name] is an 81 y.o. female with a history of hypothyroidism, GERD who presented to the ED on 10/29/2022 from PCP's office after finding new AFib with RVR. There was noted to have pneumonia with right pleural effusion treated with antibiotics and thoracentesis (transudate, negative Cx and cytology). Cardiology was consulted, eliquis started, and ultimately got rates under adequate control. On 11/25 she developed right gaze deviation, left hemiparesis and neglect due to right M1 occlusion, taken emergently for mechanical thrombectomy which restored TICI 3 flow. She stabilized, ultimately transferring to CIR 11/30. While undergoing therapy, diltiazem dose was decreased due to orthostatic hypotension. She went recurrently into rapid atrial fibrillation with hypotension prompting readmission on 12/15. Subsequently underwent TEE/DCCV 12/18. Plan was to transfer back to CIR, though she's recurrently in rapid atrial fibrillation again 12/20. Amiodarone gtt initiated, ultimately tapered. the patient's atrial fibrillation/flutter rate is improved but not tolerated well. DCCV is planned 12/27. Updated PT/OT recommendations are needed to return to CIR.  Subjective: Still having some delirium. Has episodes of dyspnea/nausea that she thinks may be related to anxiety, though these do seem to coincide with more rapid heart rates.   Objective: BP 120/84 (BP Location: Right Arm)   Pulse 93   Temp 98 F (36.7 C) (Oral)   Resp 18   Ht 5' 7.5" (1.715 m)   Wt 66.1 kg   SpO2 100%   BMI 22.50 kg/m   Gen: No distress Pulm: Clear, nonlabored  CV: Irreg, flutter waves on monitor, rate overall in 90's, no pitting edema GI: Soft, NT, ND, +BS  Neuro: Alert and oriented at this time. No new focal deficits. Ext: Warm, no deformities Skin: No rashes, lesions or ulcers on  visualized skin   Assessment & Plan: Paroxysmal atrial fibrillation/flutter with RVR: s/p TEE/DCCV 12/18, reverted NSR > AFib 12/20. Does not tolerate atrial fibrillation well. Options limited with hypotension.  - Atrial flutter is persistent despite ongoing amiodarone and she seems to have persistent symptoms attributable to this. DCCV planned per cardiology 12/27.  - Cardiology assistance appreciated. Continue amiodarone with oral loading, '400mg'$  BID, will start maintenance dose '200mg'$  daily 12/27. prn digoxin, not currently required.  - Goal potassium at least 4 and magnesium at least 2. Recheck labs in AM. - Continue Eliquis   Delirium: Improving. Ammonia noted to be 36 (ULN is 35). No asterixis on exam, no other hepatic dysfunction or lethargy.  - Continue delirium precautions. Mobilize as much as possible, may go into hallway in [facility].  - Tx possible UTI as below.   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  - Continue anticoagulation, statin.  - Continue PT/OT/SLP. Updated rec's needed to restart insurance authorization process.   - New stroke ruled out: Initial concern after brief unresponsive episode with right insular hypodensity on CT 12/21, no acute findings on subsequent MRI/MRA. No seizure seen on EEG. Mentation and neurological status is back to her recent baseline. No syncope.  - Avoid hypotension  UTI:  - Start amoxicillin per discussion with family and culture results (enterococcus)  Orthostatic Hypotension  Hypotension: - Continue midodrine. - Continue TED hose/abdominal binder.    HFrEF, tachycardia-mediated cardiomyopathy, severe MR: LVEF 40-45% with global hypokinesis. TEE in setting of AFib with RVR noted interval worsening LVEF to 20%.  - Monitor I/O, weights, BMP.  - Consider ischemic evaluation eventually.  Vitamin B12 deficiency: B12 low normal. MMA elevated. - Continue B12 supplementation   COPD: Patient without acute  exacerbation.   Hypothyroidism: TSH 11.301.   - Continue levothyroxine (increased dose)   [name], MD [facility] [email or web address] 12/04/2022, 1:56 PM

## 2022-12-04 NOTE — Progress Notes (Signed)
Physical Therapy Treatment Patient Details Name: Leslie [name] MRN: [id number] DOB: 1941/06/20 Today's Date: 12/04/2022   History of Present Illness Leslie [name] is a 81 y.o. female who was transferred back to inpatient side (from [facility] being found to be in atrial flutter with heart rates into the 160s and low blood pressures. S/p TEE/cardioversion on 12/18. PHMx: CVA (R MCA)11/03/22, atrial flutter fibrillation/flutter, hypothyroidism, CKD, RA, plantar fascitis, L THA 2022, GERD    PT Comments    Pt progressing with mobility and no c/o's of lightheadedness or dizziness. Able to walk some in hallway. Continues to have cognitive deficits impairing function. Continue to recommend acute inpatient rehab (AIR) for post-acute therapy needs.   Orthostatic BPs  Sitting 126/80  Standing 109/92  Sitting after amb 126/73       Recommendations for follow up therapy are one component of a multi-disciplinary discharge planning process, led by the attending physician.  Recommendations may be updated based on patient status, additional functional criteria and insurance authorization.  Follow Up Recommendations  Acute inpatient rehab (3hours/day)     Assistance Recommended at Discharge Frequent or constant Supervision/Assistance  Patient can return home with the following A little help with walking and/or transfers;A little help with bathing/dressing/bathroom;Assistance with cooking/housework;Assist for transportation;Direct supervision/assist for medications management;Help with stairs or ramp for entrance   Equipment Recommendations  None recommended by PT    Recommendations for Other Services Rehab consult     Precautions / Restrictions Precautions Precautions: Fall;Other (comment) Precaution Comments: orthostatic, teds, ace wraps and ABD binder Restrictions Weight Bearing Restrictions: No Other Position/Activity Restrictions: TED hose, abdominal binder, ace wraps      Mobility  Bed Mobility               General bed mobility comments: Pt up in chair    Transfers Overall transfer level: Needs assistance Equipment used: Rolling walker (2 wheels) Transfers: Sit to/from Stand Sit to Stand: Min assist   Step pivot transfers: Min assist       General transfer comment: Assist for balance. Verbal cues for hand placement    Ambulation/Gait Ambulation/Gait assistance: Min assist, +2 physical assistance Gait Distance (Feet): 100 Feet Assistive device: Rolling walker (2 wheels) Gait Pattern/deviations: Step-through pattern, Decreased step length - right, Decreased stride length, Trunk flexed, Drifts right/left Gait velocity: decr Gait velocity interpretation: <1.31 ft/sec, indicative of household ambulator   General Gait Details: Assist for balance and to guide walker. Verbal cues to look up. Second person for chair follow since recent orthostasis   Stairs             Wheelchair Mobility    Modified Rankin (Stroke Patients Only)       Balance Overall balance assessment: Needs assistance Sitting-balance support: Feet supported Sitting balance-Leahy Scale: Fair     Standing balance support: Bilateral upper extremity supported, During functional activity Standing balance-Leahy Scale: Poor Standing balance comment: walker and min guard for static standing                            Cognition Arousal/Alertness: Awake/alert Behavior During Therapy: Impulsive Overall Cognitive Status: Impaired/Different from baseline Area of Impairment: Orientation, Attention, Memory, Following commands, Safety/judgement, Awareness, Problem solving                 Orientation Level: Place, Situation Current Attention Level: Selective Memory: Decreased recall of precautions, Decreased short-term memory Following Commands: Follows one step commands inconsistently, Follows one  step commands with increased  time Safety/Judgement: Decreased awareness of safety, Decreased awareness of deficits Awareness: Intellectual Problem Solving: Slow processing, Requires verbal cues, Requires tactile cues          Exercises      General Comments General comments (skin integrity, edema, etc.): See Assessment for BP's      Pertinent Vitals/Pain Pain Assessment Pain Assessment: No/denies pain    Home Living                          Prior Function            PT Goals (current goals can now be found in the care plan section) Progress towards PT goals: Progressing toward goals    Frequency    Min 3X/week      PT Plan Current plan remains appropriate    Co-evaluation              AM-PAC PT "6 Clicks" Mobility   Outcome Measure  Help needed turning from your back to your side while in a flat bed without using bedrails?: None Help needed moving from lying on your back to sitting on the side of a flat bed without using bedrails?: A Little Help needed moving to and from a bed to a chair (including a wheelchair)?: A Little Help needed standing up from a chair using your arms (e.g., wheelchair or bedside chair)?: A Little Help needed to walk in hospital room?: A Little Help needed climbing 3-5 steps with a railing? : A Lot 6 Click Score: 18    End of Session Equipment Utilized During Treatment: Gait belt Activity Tolerance: Patient tolerated treatment well Patient left: in chair;with call bell/phone within reach;with chair alarm set   PT Visit Diagnosis: Other abnormalities of gait and mobility (R26.89);Unsteadiness on feet (R26.81);Muscle weakness (generalized) (M62.81)     Time: 9166-0600 PT Time Calculation (min) (ACUTE ONLY): 25 min  Charges:  $Gait Training: 23-37 mins                     [place] [facility] [place] 12/04/2022, 4:14 PM

## 2022-12-05 ENCOUNTER — Inpatient Hospital Stay (HOSPITAL_COMMUNITY): Payer: Medicare HMO | Admitting: Anesthesiology

## 2022-12-05 ENCOUNTER — Encounter (HOSPITAL_COMMUNITY)
Admission: EM | Disposition: A | Discharge status: Rehabilitation Facility | Payer: Self-pay | Source: Intra-hospital | Attending: Family Medicine

## 2022-12-05 ENCOUNTER — Encounter (HOSPITAL_COMMUNITY): Payer: Self-pay | Admitting: Family Medicine

## 2022-12-05 DIAGNOSIS — I4891 Unspecified atrial fibrillation: Secondary | ICD-10-CM

## 2022-12-05 DIAGNOSIS — N189 Chronic kidney disease, unspecified: Secondary | ICD-10-CM

## 2022-12-05 DIAGNOSIS — I509 Heart failure, unspecified: Secondary | ICD-10-CM

## 2022-12-05 DIAGNOSIS — E039 Hypothyroidism, unspecified: Secondary | ICD-10-CM

## 2022-12-05 HISTORY — PX: CARDIOVERSION: SHX1299

## 2022-12-05 LAB — CBC
HCT: 39.2 % (ref 36.0–46.0)
Hemoglobin: 12.4 g/dL (ref 12.0–15.0)
MCH: 27.8 pg (ref 26.0–34.0)
MCHC: 31.6 g/dL (ref 30.0–36.0)
MCV: 87.9 fL (ref 80.0–100.0)
Platelets: 232 10*3/uL (ref 150–400)
RBC: 4.46 MIL/uL (ref 3.87–5.11)
RDW: 15.8 % — ABNORMAL HIGH (ref 11.5–15.5)
WBC: 10.1 10*3/uL (ref 4.0–10.5)
nRBC: 0 % (ref 0.0–0.2)

## 2022-12-05 LAB — BASIC METABOLIC PANEL
Anion gap: 10 (ref 5–15)
BUN: 12 mg/dL (ref 8–23)
CO2: 26 mmol/L (ref 22–32)
Calcium: 9.7 mg/dL (ref 8.9–10.3)
Chloride: 105 mmol/L (ref 98–111)
Creatinine, Ser: 1.27 mg/dL — ABNORMAL HIGH (ref 0.44–1.00)
GFR, Estimated: 43 mL/min — ABNORMAL LOW (ref >60)
Glucose, Bld: 95 mg/dL (ref 70–99)
Potassium: 4 mmol/L (ref 3.5–5.1)
Sodium: 141 mmol/L (ref 135–145)

## 2022-12-05 LAB — MAGNESIUM: Magnesium: 2.1 mg/dL (ref 1.7–2.4)

## 2022-12-05 SURGERY — CARDIOVERSION
Anesthesia: General

## 2022-12-05 MED ORDER — SODIUM CHLORIDE 0.9 % IV SOLN
INTRAVENOUS | Status: AC | PRN
  Administered 2022-12-05: 500 mL via INTRAVENOUS

## 2022-12-05 MED ORDER — PROPOFOL 10 MG/ML IV BOLUS
INTRAVENOUS | Status: DC | PRN
  Administered 2022-12-05: 40 mg via INTRAVENOUS

## 2022-12-05 MED ORDER — LIDOCAINE 2% (20 MG/ML) 5 ML SYRINGE
INTRAMUSCULAR | Status: DC | PRN
  Administered 2022-12-05: 60 mg via INTRAVENOUS

## 2022-12-05 NOTE — CV Procedure (Signed)
    CARDIOVERSION NOTE  Procedure: Electrical Cardioversion Indications:  Atrial Fibrillation  Procedure Details:  Consent: Risks of procedure as well as the alternatives and risks of each were explained to the (patient/caregiver).  Consent for procedure obtained.  Time Out: Verified patient identification, verified procedure, site/side was marked, verified correct patient position, special equipment/implants available, medications/allergies/relevent history reviewed, required imaging and test results available.  Performed  Patient placed on cardiac monitor, pulse oximetry, supplemental oxygen as necessary.  Sedation given:  propofol per anesthesia Pacer pads placed anterior and posterior chest.  Cardioverted 1 time(s).  Cardioverted at 150J biphasic.  Impression: Findings: Post procedure EKG shows: NSR Complications: None Patient did tolerate procedure well.  Plan: Successful DCCV with a single 150J shock to NSR.  Time Spent Directly with the Patient:  30 minutes   Pixie Casino, MD, [facility], [place] Director of the [facility]  [facility] Diplomate of the American Board of Clinical Lipidology Attending Cardiologist  Direct Dial: [phone]  Fax: [phone]  Website:  [email or web address] 12/05/2022, 8:36 AM

## 2022-12-05 NOTE — Progress Notes (Signed)
Inpatient Rehabilitation Admissions Coordinator   I met at bedside with patient , daughter and granddaughter. I await updated OT assessment today to begin Insurance Auth with [facility] for possible readmit to CIR this week. I have contacted acute therapy office of my request and need for today.  Danne Baxter, RN, MSN Rehab Admissions Coordinator [phone] 12/05/2022 11:17 AM

## 2022-12-05 NOTE — Progress Notes (Addendum)
PROGRESS NOTE    Leslie [name]  JTT:[id number] DOB: 04/28/1941 DOA: 11/23/2022 PCP: Leslie Side., Leslie [name]  No chief complaint on file.   Brief Narrative:  Leslie [name] is an 81 y.o. female with Leslie [name] history of hypothyroidism, GERD who presented to the ED on 10/29/2022 from PCP's office after finding new AFib with RVR. There was noted to have pneumonia with right pleural effusion treated with antibiotics and thoracentesis (transudate, negative Cx and cytology). Cardiology was consulted, eliquis started, and ultimately got rates under adequate control. On 11/25 she developed right gaze deviation, left hemiparesis and neglect due to right M1 occlusion, taken emergently for mechanical thrombectomy which restored TICI 3 flow. She stabilized, ultimately transferring to CIR 11/30. While undergoing therapy, diltiazem dose was decreased due to orthostatic hypotension. She went recurrently into rapid atrial fibrillation with hypotension prompting readmission on 12/15. Subsequently underwent TEE/DCCV 12/18. Plan was to transfer back to CIR, though she's recurrently in rapid atrial fibrillation again 12/20. Amiodarone gtt initiated, ultimately tapered. the patient's atrial fibrillation/flutter rate is improved but not tolerated well. DCCV is planned 12/27. Updated PT/OT recommendations are needed to return to CIR.   Assessment & Plan:   Principal Problem:   Paroxysmal Leslie [name] (Leslie [name]) Active Problems:   Atrial fibrillation with rapid ventricular response ([name])   Hypotension   Heart failure with reduced ejection fraction ([name])   History of CVA with residual deficit   COPD (chronic obstructive pulmonary disease) ([name])   Hypothyroidism   Pressure injury of skin   Cerebrovascular accident (CVA) ([name]  Paroxysmal atrial fibrillation/flutter with RVR: s/p TEE/DCCV 12/18, reverted NSR > AFib 12/20. Does not tolerate atrial fibrillation well. Options limited with hypotension.  - Atrial flutter is  persistent despite ongoing amiodarone and she seems to have persistent symptoms attributable to this.  - s/p DCCV 12/27 - Cardiology assistance appreciated. Continue amiodarone with oral loading, '400mg'$  BID, will start maintenance dose '200mg'$  daily 12/27. prn digoxin, not currently required.  - Goal potassium at least 4 and magnesium at least 2. Recheck labs in AM. - Continue Eliquis   Delirium: Improving. Ammonia noted to be 36 (ULN is 35). No asterixis on exam, no other hepatic dysfunction or lethargy.  - Continue delirium precautions. Mobilize as much as possible, may go into hallway in Leslie [name].  - Tx possible UTI as below.    Tremor - seems new, more left sided - ? If this is medication related, will review with pharmacy (amiodarone, synthroid can worsen physiologic tremor) - if persistent, would warrant follow up with neurology (? 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  - Continue anticoagulation, statin.  - Continue PT/OT/SLP. Updated rec's needed to restart insurance authorization process.   - New stroke ruled out: Initial concern after brief unresponsive episode with right insular hypodensity on CT 12/21, no acute findings on subsequent MRI/MRA. No seizure seen on EEG. Mentation and neurological status is back to her recent baseline. No syncope.  - Avoid hypotension   Dysphagia - will ask SLP to reeval, concerns regarding swallowing per patient  Enterococcus UTI:  - amoxicillin per discussion with family and culture results (enterococcus)   Orthostatic Hypotension  Hypotension: - Continue midodrine. - Continue TED hose/abdominal binder.  - follow orthostatics daily    HFrEF, tachycardia-mediated cardiomyopathy, severe MR: LVEF 40-45% with global hypokinesis. TEE in setting of AFib with RVR noted interval worsening LVEF to 20%.  -  Monitor I/O, weights, BMP.  - Consider ischemic evaluation  eventually.     Vitamin B12 deficiency: B12 low normal. MMA elevated. - Continue B12 supplementation   COPD: Patient without acute exacerbation.   Hypothyroidism: TSH 11.301.   - Continue levothyroxine (increased dose)    DVT prophylaxis: eliquis Code Status: full Family Communication: daughter Disposition:   Status is: Inpatient Remains inpatient appropriate because: working on placement   Consultants:  cardiology  Procedures:  12/27 Plan: Successful DCCV with Leslie [name] single 150J shock to NSR.  12/20 Echo IMPRESSIONS     1. Limited study with only 45 images, technically difficult   2. Left ventricular ejection fraction, by estimation, is 40 to 45%. The  left ventricle has mildly decreased function. The left ventricle  demonstrates global hypokinesis. There is mild asymmetric left ventricular  hypertrophy. Left ventricular diastolic  function could not be evaluated.   3. The mitral valve is abnormal. Mild to moderate mitral valve  regurgitation.   4. Rhythm strip during this exam demonstrates atrial fib with rapid  ventricular response.   5. The aortic valve is tricuspid. Aortic valve regurgitation is not  visualized.   6. Right ventricular systolic function was not well visualized. The right  ventricular size is not well visualized.   Comparison(s): Changes from prior study are noted. 10/30/2022: LVEF  40-45%, modearate to severe MR.   Conclusion(s)/Recommendation(s): Consider repeat limited echo when the HR  is lower.   12/18 IMPRESSIONS     1. Left ventricular ejection fraction, by estimation, is 20%. The left  ventricle has severely decreased function.   2. Right ventricular systolic function is severely reduced. The right  ventricular size is mildly enlarged.   3. Left atrial size was moderately dilated. No left atrial/left atrial  appendage thrombus was detected. The LAA emptying velocity was 35 cm/s.   4. Leslie [name] small pericardial effusion is present.   5.  The mitral valve is grossly normal. Severe mitral valve regurgitation,  likely secondary to LV dysfunction and atrial functional MR. Multiple  jets, appears commissural.   6. The aortic valve is grossly normal. Aortic valve regurgitation is  mild.   7. There is mild (Grade II) atheroma plaque involving the aortic arch and  descending aorta.   Conclusion(s)/Recommendation(s): No LA/LAA thrombus identified. Successful  cardioversion performed with restoration of normal sinus rhythm.   11/21 IMPRESSIONS     1. Left ventricular ejection fraction, by estimation, is 40 to 45%. The  left ventricle has mildly decreased function. The left ventricle  demonstrates global hypokinesis. There is moderate asymmetric left  ventricular hypertrophy of the basal-septal  segment. Left ventricular diastolic parameters are indeterminate.   2. Right ventricular systolic function is normal. The right ventricular  size is normal. There is normal pulmonary artery systolic pressure.   3. Left atrial size was mildly dilated.   4. The mitral valve is abnormal. Moderate to severe mitral valve  regurgitation. No evidence of mitral stenosis.   5. The aortic valve was not well visualized. Aortic valve regurgitation  is trivial. No aortic stenosis is present.   6. The inferior vena cava is dilated in size with >50% respiratory  variability, suggesting right atrial pressure of 8 mmHg.   Comparison(s): MR is worse from prior report.  Antimicrobials:  Anti-infectives (From admission, onward)    Start     Dose/Rate Route Frequency Ordered Stop   12/01/22 1800  amoxicillin (AMOXIL) capsule 500 mg        500  mg Oral Every 8 hours 12/01/22 1152     12/01/22 0730  amoxicillin (AMOXIL) capsule 500 mg  Status:  Discontinued        500 mg Oral Every 8 hours 12/01/22 0637 12/01/22 1152       Subjective: No complaints today  Objective: Vitals:   12/05/22 0850 12/05/22 0900 12/05/22 1300 12/05/22 1335  BP: 133/81  129/82 132/79 118/82  Pulse: 64 67    Resp: '14 14 13 16  '$ Temp:    98.1 F (36.7 C)  TempSrc:    Oral  SpO2: 100% 100%    Weight:      Height:        Intake/Output Summary (Last 24 hours) at 12/05/2022 1605 Last data filed at 12/05/2022 0836 Gross per 24 hour  Intake 460 ml  Output --  Net 460 ml   Filed Weights   12/03/22 0506 12/04/22 0450 12/05/22 0619  Weight: 67 kg 66.1 kg 66.8 kg    Examination:  General exam: Appears calm and comfortable  Respiratory system: Clear to auscultation. Respiratory effort normal. Cardiovascular system: S1 & S2 heard, RRR.  Gastrointestinal system: Abdomen is nondistended, soft and nontender.  Central nervous system: Alert and oriented. No focal neurological deficits. Has resting tremor, seems worse on L Extremities: no LEE    Data Reviewed: I have personally reviewed following labs and imaging studies  CBC: Recent Labs  Lab 11/29/22 0241 11/30/22 0700 12/01/22 0138 12/05/22 0358  WBC 10.8* 9.7  --  10.1  HGB 13.0 11.5* 13.2 12.4  HCT 40.9 38.1 42.2 39.2  MCV 87.2 92.5  --  87.9  PLT 204 155  --  161    Basic Metabolic Panel: Recent Labs  Lab 11/29/22 0241 11/30/22 0700 11/30/22 0940 12/01/22 0138 12/05/22 0358  NA 139 131* 138 140 141  K 3.4* 3.3* 3.7 4.5 4.0  CL 108 86* 105 108 105  CO2 23 20* '24 25 26  '$ GLUCOSE 118* 719* 113* 96 95  BUN '9 9 10 11 12  '$ CREATININE 1.15* 1.14* 1.14* 1.13* 1.27*  CALCIUM 9.6 8.2* 9.7 9.6 9.7  MG 2.0  --   --   --  2.1    GFR: Estimated Creatinine Clearance: 35 mL/min (Leslie [name]) (by C-G formula based on SCr of 1.27 mg/dL (H)).  Liver Function Tests: No results for input(s): "AST", "ALT", "ALKPHOS", "BILITOT", "PROT", "ALBUMIN" in the last 168 hours.  CBG: Recent Labs  Lab 11/28/22 2313 11/30/22 0814  GLUCAP 143* 108*     Recent Results (from the past 240 hour(s))  Urine Culture     Status: Abnormal   Collection Time: 11/28/22 12:24 AM   Specimen: Urine, Clean Catch  Result  Value Ref Range Status   Specimen Description URINE, CLEAN CATCH  Final   Special Requests   Final    NONE Performed at [facility], [place] [address], [place], [place] [address]    Culture 60,000 COLONIES/mL ENTEROCOCCUS FAECALIS (Leslie [name])  Final   Report Status 11/30/2022 FINAL  Final   Organism ID, Bacteria ENTEROCOCCUS FAECALIS (Leslie [name])  Final      Susceptibility   Enterococcus faecalis - MIC*    AMPICILLIN <=2 SENSITIVE Sensitive     NITROFURANTOIN <=16 SENSITIVE Sensitive     VANCOMYCIN 1 SENSITIVE Sensitive     * 60,000 COLONIES/mL ENTEROCOCCUS FAECALIS         Radiology Studies: No results found.      Scheduled Meds:  amiodarone  200  mg Oral Daily   amoxicillin  500 mg Oral Q8H   apixaban  5 mg Oral BID   vitamin B-12  1,000 mcg Oral Daily   famotidine  20 mg Oral Daily   feeding supplement  237 mL Oral TID BM   Leslie [name]   Topical BID   levothyroxine  100 mcg Oral Q0600   midodrine  5 mg Oral TID WC   mouth rinse  15 mL Mouth Rinse 4 times per day   rosuvastatin  10 mg Oral Daily   senna-docusate  2 tablet Oral BID   Continuous Infusions:   LOS: 12 days    Time spent: over 30 min    Fayrene Helper, MD [facility]   To contact the attending provider between 7A-7P or the covering provider during after hours 7P-7A, please log into the web site [email or web address] and access using universal [place] password for that web site. If you do not have the password, please call the hospital operator.  12/05/2022, 4:05 PM

## 2022-12-05 NOTE — Anesthesia Postprocedure Evaluation (Signed)
Anesthesia Post Note  Patient: Leslie [name]  Procedure(s) Performed: CARDIOVERSION     Patient location during evaluation: Endoscopy Anesthesia Type: General Level of consciousness: awake and alert Pain management: pain level controlled Vital Signs Assessment: post-procedure vital signs reviewed and stable Respiratory status: spontaneous breathing, nonlabored ventilation and respiratory function stable Cardiovascular status: blood pressure returned to baseline and stable Postop Assessment: no apparent nausea or vomiting Anesthetic complications: no   No notable events documented.  Last Vitals:  Vitals:   12/05/22 0850 12/05/22 0900  BP: 133/81 129/82  Pulse: 64 67  Resp: 14 14  Temp:    SpO2: 100% 100%    Last Pain:  Vitals:   12/05/22 0900  TempSrc:   PainSc: 0-No pain                 Lidia Collum

## 2022-12-05 NOTE — Anesthesia Procedure Notes (Signed)
Procedure Name: General with mask airway Date/Time: 12/05/2022 8:33 AM  Performed by: Lowella Dell, CRNAPre-anesthesia Checklist: Patient identified, Emergency Drugs available, Suction available, Patient being monitored and Timeout performed Patient Re-evaluated:Patient Re-evaluated prior to induction Oxygen Delivery Method: Ambu bag Preoxygenation: Pre-oxygenation with 100% oxygen Induction Type: IV induction Ventilation: Mask ventilation without difficulty Placement Confirmation: positive ETCO2 Dental Injury: Teeth and Oropharynx as per pre-operative assessment

## 2022-12-05 NOTE — Interval H&P Note (Signed)
History and Physical Interval Note:  12/05/2022 8:18 AM  Leslie [name]  has presented today for surgery, with the diagnosis of afib.  The various methods of treatment have been discussed with the patient and family. After consideration of risks, benefits and other options for treatment, the patient has consented to  Procedure(s): CARDIOVERSION (N/A) as a surgical intervention.  The patient's history has been reviewed, patient examined, no change in status, stable for surgery.  I have reviewed the patient's chart and labs.  Questions were answered to the patient's satisfaction.     Pixie Casino

## 2022-12-05 NOTE — Progress Notes (Signed)
Occupational Therapy Treatment Patient Details Name: Leslie [name] MRN: [id number] DOB: 1941-01-13 Today's Date: 12/05/2022   History of present illness Leslie [name] is a 81 y.o. female who was transferred back to inpatient side (from [facility] being found to be in atrial flutter with heart rates into the 160s and low blood pressures. S/p TEE/cardioversion on 12/18. 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 Upon arrival, pt was perseverating on having "crumbs" in her teeth. Thorough oral care completed while sitting EOB with mod-max A for problem solving and verbal cues, pt was difficult to re-direct despite maximal efforts. Overall she was min A to stand to re-adjust at the EOB, VSS on RA through positional changes. OT to continue to follow acutely. AIR is appropriate.    Recommendations for follow up therapy are one component of a multi-disciplinary discharge planning process, led by the attending physician.  Recommendations may be updated based on patient status, additional functional criteria and insurance authorization.    Follow Up Recommendations  Acute inpatient rehab (3hours/day)     Assistance Recommended at Discharge Frequent or constant Supervision/Assistance  Patient can return home with the following  A little help with walking and/or transfers;A little help with bathing/dressing/bathroom;Assistance with cooking/housework;Assistance with feeding;Direct supervision/assist for medications management;Direct supervision/assist for financial management;Assist for transportation;Help with stairs or ramp for entrance   Equipment Recommendations  Other (comment)    Recommendations for Other Services Rehab  consult    Precautions / Restrictions Precautions Precautions: Fall;Other (comment) Precaution Comments: orthostatic, teds, ace wraps and ABD binder Restrictions Weight Bearing Restrictions: No Other Position/Activity Restrictions: TED hose, abdominal binder, ace wraps       Mobility Bed Mobility Overal bed mobility: Needs Assistance Bed Mobility: Supine to Sit, Sit to Supine     Supine to sit: Min assist Sit to supine: Supervision   General bed mobility comments: cues required for problem solving    Transfers Overall transfer level: Needs assistance Equipment used: Rolling walker (2 wheels) Transfers: Sit to/from Stand Sit to Stand: Min assist                 Balance Overall balance assessment: Needs assistance Sitting-balance support: Feet supported Sitting balance-Leahy Scale: Fair Sitting balance - Comments: initially poor   Standing balance support: Bilateral upper extremity supported Standing balance-Leahy Scale: Poor                             ADL either performed or assessed with clinical judgement   ADL Overall ADL's : Needs assistance/impaired     Grooming: Oral care;Maximal assistance;Sitting           Upper Body Dressing : Minimal assistance;Sitting                     General ADL Comments: significantly increased time and cues needed for all funcitonal tasks    Extremity/Trunk Assessment Upper Extremity Assessment Upper Extremity Assessment: LUE deficits/detail LUE Deficits / Details: using to complete oral hygiene   Lower Extremity Assessment Lower Extremity Assessment: Defer to PT evaluation        Vision   Vision Assessment?: Vision impaired- to be further tested in functional context Alignment/Gaze Preference: Gaze right Tracking/Visual Pursuits: Right eye does not track  laterally;Left eye does not track laterally;Decreased smoothness of horizontal tracking;Requires cues, head turns, or add eye shifts to  track Saccades: Additional eye shifts occurred during testing;Undershoots Convergence: Impaired - to be further tested in functional context Depth Perception: Undershoots Additional Comments: continues to demonstrate L inattention   Perception Perception Perception: Not tested   Praxis Praxis Praxis: Not tested    Cognition Arousal/Alertness: Awake/alert Behavior During Therapy: Impulsive Overall Cognitive Status: Impaired/Different from baseline Area of Impairment: Orientation, Attention, Memory, Following commands, Safety/judgement, Awareness, Problem solving                 Orientation Level: Place, Situation Current Attention Level: Selective Memory: Decreased recall of precautions, Decreased short-term memory Following Commands: Follows one step commands inconsistently, Follows one step commands with increased time Safety/Judgement: Decreased awareness of safety, Decreased awareness of deficits Awareness: Intellectual Problem Solving: Slow processing, Requires verbal cues, Requires tactile cues General Comments: highly distractable, poor attention. needs a non-distracting, clear environment and maximal verbal cues. Pt perseverating on having "crumbs" in her teeth throughout sesssion despite thorough oral care              General Comments VSS on RA    Pertinent Vitals/ Pain       Pain Assessment Pain Assessment: Faces Faces Pain Scale: No hurt Pain Intervention(s): Monitored during session   Frequency  Min 2X/week        Progress Toward Goals  OT Goals(current goals can now be found in the care plan section)  Progress towards OT goals: Progressing toward goals  Acute Rehab OT Goals Patient Stated Goal: to get crumbs out of teeth OT Goal Formulation: With patient Time For Goal Achievement: 12/10/22 Potential to Achieve Goals: Fair ADL Goals Pt Will Perform Grooming: with modified independence;standing Pt Will Perform Upper Body Dressing: with min  assist;sitting Pt Will Perform Lower Body Dressing: with min assist;sitting/lateral leans;sit to/from stand Pt Will Transfer to Toilet: with min assist;ambulating;regular height toilet Additional ADL Goal #1: pt will indep complete IADL medication management task  Plan Discharge plan remains appropriate;Frequency remains appropriate       AM-PAC OT "6 Clicks" Daily Activity     Outcome Measure   Help from another person eating meals?: A Little Help from another person taking care of personal grooming?: A Lot Help from another person toileting, which includes using toliet, bedpan, or urinal?: A Lot Help from another person bathing (including washing, rinsing, drying)?: A Lot Help from another person to put on and taking off regular upper body clothing?: A Little Help from another person to put on and taking off regular lower body clothing?: A Lot 6 Click Score: 14    End of Session Equipment Utilized During Treatment: Rolling walker (2 wheels)  OT Visit Diagnosis: Unsteadiness on feet (R26.81);Other abnormalities of gait and mobility (R26.89);Muscle weakness (generalized) (M62.81);Hemiplegia and hemiparesis Hemiplegia - Right/Left: Left Hemiplegia - dominant/non-dominant: Non-Dominant Hemiplegia - caused by: Cerebral infarction   Activity Tolerance Patient tolerated treatment well;Other (comment)   Patient Left Other (comment)   Nurse Communication Mobility status        Time: 0160-1093 OT Time Calculation (min): 18 min  Charges: OT General Charges $OT Visit: 1 Visit OT Treatments $Self Care/Home Management : 8-22 mins    Elliot Cousin 12/05/2022, 12:09 PM

## 2022-12-05 NOTE — Transfer of Care (Signed)
Immediate Anesthesia Transfer of Care Note  Patient: Leslie [name]  Procedure(s) Performed: CARDIOVERSION  Patient Location: PACU and Endoscopy Unit  Anesthesia Type:General  Level of Consciousness: drowsy and patient cooperative  Airway & Oxygen Therapy: Patient Spontanous Breathing and Patient connected to nasal cannula oxygen  Post-op Assessment: Report given to RN and Post -op Vital signs reviewed and stable  Post vital signs: Reviewed and stable  Last Vitals:  Vitals Value Taken Time  BP 125/73   Temp    Pulse 57   Resp 19   SpO2 100     Last Pain:  Vitals:   12/05/22 0808  TempSrc: Temporal  PainSc: 0-No pain      Patients Stated Pain Goal: 0 (89/37/34 2876)  Complications: No notable events documented.

## 2022-12-05 NOTE — Progress Notes (Signed)
Rounding Note    Patient Name: Leslie [name] Date of Encounter: 12/05/2022  [place] Cardiologist: Peter Martinique, MD   Subjective   Atrial fib continues, rate controlled. Seen in Endo prior to procedure.  Inpatient Medications    Scheduled Meds:  [MAR Hold] amiodarone  200 mg Oral Daily   [MAR Hold] amoxicillin  500 mg Oral Q8H   [MAR Hold] apixaban  5 mg Oral BID   [MAR Hold] vitamin B-12  1,000 mcg Oral Daily   [MAR Hold] famotidine  20 mg Oral Daily   [MAR Hold] feeding supplement  237 mL Oral TID BM   [MAR Hold] Gerhardt's butt cream   Topical BID   [MAR Hold] levothyroxine  100 mcg Oral Q0600   [MAR Hold] midodrine  5 mg Oral TID WC   [MAR Hold] mouth rinse  15 mL Mouth Rinse 4 times per day   [MAR Hold] rosuvastatin  10 mg Oral Daily   [MAR Hold] senna-docusate  2 tablet Oral BID   Continuous Infusions:  sodium chloride       PRN Meds: sodium chloride, [MAR Hold] acetaminophen, [MAR Hold] alum & mag hydroxide-simeth, [MAR Hold] bisacodyl **OR** [MAR Hold] sorbitol, [MAR Hold] calcium carbonate, [MAR Hold] camphor-menthol, [MAR Hold] guaiFENesin-dextromethorphan, [MAR Hold] ipratropium-albuterol, [MAR Hold] melatonin, [MAR Hold] ondansetron (ZOFRAN) IV, [MAR Hold] mouth rinse, [MAR Hold] prochlorperazine **OR** [MAR Hold] prochlorperazine **OR** [MAR Hold] prochlorperazine, [MAR Hold] sodium phosphate   Vital Signs    Vitals:   12/04/22 1506 12/05/22 0220 12/05/22 0619 12/05/22 0808  BP: (!) 140/83 (!) 142/84  (!) 140/99  Pulse:    88  Resp: '14 17  15  '$ Temp: (!) 97.5 F (36.4 C) 97.8 F (36.6 C)  98 F (36.7 C)  TempSrc: Oral   Temporal  SpO2:    98%  Weight:   66.8 kg   Height:        Intake/Output Summary (Last 24 hours) at 12/05/2022 0828 Last data filed at 12/04/2022 1800 Gross per 24 hour  Intake 360 ml  Output --  Net 360 ml       12/05/2022    6:19 AM 12/04/2022    4:50 AM 12/03/2022    5:06 AM  Last 3 Weights   Weight (lbs) 147 lb 4.3 oz 145 lb 12.8 oz 147 lb 11.3 oz  Weight (kg) 66.8 kg 66.134 kg 67 kg      Telemetry    Afib  80-100s- Personally Reviewed  ECG    No new tracing- Personally Reviewed  Physical Exam   GEN: laying in bed comfortable. Neck: No JVD Cardiac: Irregularly irregular, 2/6 systolic apical murmur Respiratory: Clear bilaterally GI: Soft, nontender, non-distended  MS: No edema; No deformity. Neuro:  Nonfocal  Psych: Normal affect   Labs    High Sensitivity Troponin:   Recent Labs  Lab 11/29/22 0241 11/29/22 0551  TROPONINIHS 8 8     Chemistry Recent Labs  Lab 11/29/22 0241 11/30/22 0700 11/30/22 0940 12/01/22 0138 12/05/22 0358  NA 139   < > 138 140 141  K 3.4*   < > 3.7 4.5 4.0  CL 108   < > 105 108 105  CO2 23   < > '24 25 26  '$ GLUCOSE 118*   < > 113* 96 95  BUN 9   < > '10 11 12  '$ CREATININE 1.15*   < > 1.14* 1.13* 1.27*  CALCIUM 9.6   < > 9.7 9.6 9.7  MG 2.0  --   --   --  2.1  GFRNONAA 48*   < > 49* 49* 43*  ANIONGAP 8   < > '9 7 10   '$ < > = values in this interval not displayed.    Lipids No results for input(s): "CHOL", "TRIG", "HDL", "LABVLDL", "LDLCALC", "CHOLHDL" in the last 168 hours.  Hematology Recent Labs  Lab 11/29/22 0241 11/30/22 0700 12/01/22 0138 12/05/22 0358  WBC 10.8* 9.7  --  10.1  RBC 4.69 4.12  --  4.46  HGB 13.0 11.5* 13.2 12.4  HCT 40.9 38.1 42.2 39.2  MCV 87.2 92.5  --  87.9  MCH 27.7 27.9  --  27.8  MCHC 31.8 30.2  --  31.6  RDW 15.8* 15.9*  --  15.8*  PLT 204 155  --  232   Thyroid  No results for input(s): "TSH", "FREET4" in the last 168 hours.   BNPNo results for input(s): "BNP", "PROBNP" in the last 168 hours.  DDimer No results for input(s): "DDIMER" in the last 168 hours.   Radiology    No results found.  Cardiac Studies  TEE 11/26/22: IMPRESSIONS     1. Left ventricular ejection fraction, by estimation, is 20%. The left  ventricle has severely decreased function.   2. Right ventricular  systolic function is severely reduced. The right  ventricular size is mildly enlarged.   3. Left atrial size was moderately dilated. No left atrial/left atrial  appendage thrombus was detected. The LAA emptying velocity was 35 cm/s.   4. A small pericardial effusion is present.   5. The mitral valve is grossly normal. Severe mitral valve regurgitation,  likely secondary to LV dysfunction and atrial functional MR. Multiple  jets, appears commissural.   6. The aortic valve is grossly normal. Aortic valve regurgitation is  mild.   7. There is mild (Grade II) atheroma plaque involving the aortic arch and  descending aorta.   Conclusion(s)/Recommendation(s): No LA/LAA thrombus identified. Successful  cardioversion performed with restoration of normal sinus rhythm.    Patient Profile     81 y.o. female with a history of paroxysmal atrial fibrillation on Eliquis, chronic systolic CHF with EF of 36-62%, mitral regurgitation, CKD stage III, hypothyroidism, GERD, and multiple CVA with recent stroke on 11/03/2022 s/p thrombectomy. Patient was recently admitted from 10/29/2022 to 11/08/2022 for new onset atrial fibrillation and was found to have a reduced EF of 40-45%. Hospitalization was complicated by community acquired pneumonia (which was initially diagnosed prior to this admission) with right pleural effusion s/p thoracentesis on 10/30/2022, and acute stroke requiring thrombectomy. She was discharged to CIR on 11/08/2022. She went back into rapid atrial fibrillation with rates in the 150s on 11/23/2022 and was hypotensive with it. Therefore, she was readmitted as an inpatient and Cardiology consulted for assistance.   Assessment & Plan     #Persistent Atrial Fibrillation:  Patient with recent admission for stroke found to have new onset atrial fibrillation. Echo showed LVEF of 40-45%. She was started on Diltiazem and apixaban. She was discharged to CIR but represented on this admission for  recurrent rapid atrial fibrillation with rates in the 150s with associated hypotension for which Cardiology was consulted. Given difficulty with rate control and soft blood pressures limiting nodal agents, she underwent TEE/DCCV on 11/26/22 with initial return to NSR but developed recurrent RVR on 12/20 now back on amiodarone gtt. - S/p successful TEE/DCCV on 11/26/22 but developed recurrent RVR on 12/20 - HR  improved; amiodarone 400 mg BID for 4 total days to complete loading, then 200 mg daily going forward. - Likely will need amiodarone long term  - Can consider low dose dig as needed but would need to be careful given renal function, no additional dosing required at this time given adequate rate control.  - Cannot tolerate nodal agents due to significant orthostatic hypotension - Continue Eliquis '5mg'$  BID - cardioversion this am.  Informed consent: Discussed 12/24 with family present: Risks, benefits and alternatives of direct current cardioversion reviewed including potential for post-cardioversion rhythms, especially life-threatening arrhythmias (ventricular tachycardia and fibrillation, profound bradycardia). Major complications may include serious or fatal arrhythmias, myocardial damage, and acute pulmonary edema; minor complications include skin burns and transient hypotension. Benefits include restoration of sinus rhythm. Alternatives to treatment were discussed, questions were answered. Patient is willing to proceed.    #Episode of decreased responsiveness: #Recent MCA CVA Patient had episode of decreased responsiveness with nonsensical speech for a brief period. Underwent CT head which showed new hypodensity in the right insular region. Follow-up brain MRI fortunately with no acute finding. Had progression of right MCA territory infract but was subacute to chronic. MRA with no recurrent occlusion or proximal flow limiting stenosis. -Management per neuro and primary  #Chronic Systolic  CHF:  Echo during recent admission showed LVEF of 40-45% with global hypokinesis , moderate asymmetric LVH of the basal-septal segment and moderate to severe MR. TEE on 11/26/22 with interval drop in EF to 20% in the setting of Afib with RVR. Currently euvolemic on exam. On amiodarone for recurrent RVR. -TEE with interval drop in EF to 20% in the setting of Afib with RVR -Unable to tolerate GDMT due to orthostatic hypotension  - repeat TTE shows similarly depressed EF, likely 25-30%. Needs rate and rhythm control as much as possible.    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 Dilt stopped and midodrine started. Continues to have significant orthostasis limiting medical therapy. -Continue midodrine '5mg'$  TID -Continue compression socks and abdominal binder   #Severe MR: Severe on TEE. Likely functional + atrial functional. Will plan to repeat TTE once back in NSR or once better rate controlled.   For questions or updates, please contact [place] Please consult [email or web address] for contact info under        Signed, Elouise Munroe, MD  12/05/2022, 8:28 AM

## 2022-12-05 NOTE — Progress Notes (Signed)
Inpatient Rehabilitation Admissions Coordinator   Ill begin Auth with [facility] Medicare for possible readmit to CIR if approved.  Danne Baxter, RN, MSN Rehab Admissions Coordinator [phone] 12/05/2022 12:45 PM

## 2022-12-06 ENCOUNTER — Inpatient Hospital Stay (HOSPITAL_COMMUNITY): Payer: Medicare HMO

## 2022-12-06 ENCOUNTER — Encounter: Payer: Self-pay | Admitting: General Practice

## 2022-12-06 LAB — CBC WITH DIFFERENTIAL/PLATELET
Abs Immature Granulocytes: 0.05 10*3/uL (ref 0.00–0.07)
Basophils Absolute: 0.1 10*3/uL (ref 0.0–0.1)
Basophils Relative: 1 %
Eosinophils Absolute: 0.1 10*3/uL (ref 0.0–0.5)
Eosinophils Relative: 1 %
HCT: 36.5 % (ref 36.0–46.0)
Hemoglobin: 11.7 g/dL — ABNORMAL LOW (ref 12.0–15.0)
Immature Granulocytes: 0 %
Lymphocytes Relative: 23 %
Lymphs Abs: 2.5 10*3/uL (ref 0.7–4.0)
MCH: 28.3 pg (ref 26.0–34.0)
MCHC: 32.1 g/dL (ref 30.0–36.0)
MCV: 88.2 fL (ref 80.0–100.0)
Monocytes Absolute: 0.8 10*3/uL (ref 0.1–1.0)
Monocytes Relative: 7 %
Neutro Abs: 7.7 10*3/uL (ref 1.7–7.7)
Neutrophils Relative %: 68 %
Platelets: 212 10*3/uL (ref 150–400)
RBC: 4.14 MIL/uL (ref 3.87–5.11)
RDW: 15.7 % — ABNORMAL HIGH (ref 11.5–15.5)
WBC: 11.3 10*3/uL — ABNORMAL HIGH (ref 4.0–10.5)
nRBC: 0 % (ref 0.0–0.2)

## 2022-12-06 LAB — COMPREHENSIVE METABOLIC PANEL
ALT: 22 U/L (ref 0–44)
AST: 30 U/L (ref 15–41)
Albumin: 3.4 g/dL — ABNORMAL LOW (ref 3.5–5.0)
Alkaline Phosphatase: 52 U/L (ref 38–126)
Anion gap: 9 (ref 5–15)
BUN: 13 mg/dL (ref 8–23)
CO2: 26 mmol/L (ref 22–32)
Calcium: 9.6 mg/dL (ref 8.9–10.3)
Chloride: 104 mmol/L (ref 98–111)
Creatinine, Ser: 1.21 mg/dL — ABNORMAL HIGH (ref 0.44–1.00)
GFR, Estimated: 45 mL/min — ABNORMAL LOW (ref >60)
Glucose, Bld: 82 mg/dL (ref 70–99)
Potassium: 4 mmol/L (ref 3.5–5.1)
Sodium: 139 mmol/L (ref 135–145)
Total Bilirubin: 1.2 mg/dL (ref 0.3–1.2)
Total Protein: 6.1 g/dL — ABNORMAL LOW (ref 6.5–8.1)

## 2022-12-06 LAB — PHOSPHORUS: Phosphorus: 3.4 mg/dL (ref 2.5–4.6)

## 2022-12-06 LAB — MAGNESIUM: Magnesium: 2.1 mg/dL (ref 1.7–2.4)

## 2022-12-06 MED ORDER — MIDODRINE HCL 5 MG PO TABS
10.0000 mg | ORAL_TABLET | Freq: Three times a day (TID) | ORAL | Status: DC
  Administered 2022-12-06 – 2022-12-08 (×5): 10 mg via ORAL
  Filled 2022-12-06 (×5): qty 2

## 2022-12-06 NOTE — Progress Notes (Signed)
Mobility Specialist - Progress Note   12/06/22 1140  Mobility  Activity Transferred from bed to chair  Level of Assistance Minimal assist, patient does 75% or more  Assistive Device Front wheel walker  Activity Response Tolerated well  Mobility Referral Yes  $Mobility charge 1 Mobility   Pt was received in bed and agreeable to transfer. Pt was MinA throughout transfer. Pt was left in chair with all needs met and chair alarm on.   Franki Monte  Mobility Specialist Please contact via Solicitor or Rehab office at [phone]

## 2022-12-06 NOTE — Progress Notes (Signed)
Physical Therapy Treatment Patient Details Name: Leslie [name] MRN: [id number] DOB: 12-Dec-1940 Today's Date: 12/06/2022   History of Present Illness Leslie [name] is a 81 y.o. female who was transferred back to inpatient side (from [facility] being found to be in atrial flutter with heart rates into the 160s and low blood pressures. S/p TEE/cardioversion on 12/18. 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 The patient continues to require assistance for bed mobility and transfers. She ambulated in the room with steadying assistance provided and cues for safety. Although she has a drop in blood pressure with mobility, she reports no symptoms.  Supine- 121/67, sitting 109/62, standing 0 minutes 79/60.  Recommend to continue PT to maximize independence and facilitate return to prior level of function. Recommend acute inpatient rehab at discharge.    Recommendations for follow up therapy are one component of a multi-disciplinary discharge planning process, led by the attending physician.  Recommendations may be updated based on patient status, additional functional criteria and insurance authorization.  Follow Up Recommendations  Acute inpatient rehab (3hours/day)     Assistance Recommended at Discharge Frequent or constant Supervision/Assistance  Patient can return home with the following A little help with walking and/or transfers;A little help with bathing/dressing/bathroom;Assistance with cooking/housework;Assist for transportation;Direct supervision/assist for medications management;Help with stairs or ramp for entrance   Equipment Recommendations  None recommended by PT    Recommendations for Other Services       Precautions / Restrictions Precautions Precautions: Fall;Other (comment) Precaution Comments: orthostatic,  teds, ace wraps and ABD binder Restrictions Weight Bearing Restrictions: No     Mobility  Bed Mobility Overal bed mobility: Needs Assistance Bed Mobility: Supine to Sit, Sit to Supine     Supine to sit: Min assist Sit to supine: Min assist   General bed mobility comments: assistance for LE support to return to bed. cues for technique    Transfers Overall transfer level: Needs assistance Equipment used: Rolling walker (2 wheels) Transfers: Sit to/from Stand Sit to Stand: Min assist, Mod assist           General transfer comment: abdominal binder donned in sitting prior to standing. patient declined ace wraps today due to discomfort previously. multiple standing bouts performed varying from Min A to Mod A. verbal cues for hand placement for safety and  faciliation for anterior weight shifting    Ambulation/Gait Ambulation/Gait assistance: Min assist Gait Distance (Feet): 15 Feet Assistive device: Rolling walker (2 wheels) Gait Pattern/deviations: Step-through pattern Gait velocity: decreased     General Gait Details: verbal cues for rolling walker negotation and occasional steadying assistance provided. further ambulation deferred due to blood pressure in standing (79/60), although patient reports no dizziness with standing activity   Stairs             Wheelchair Mobility    Modified Rankin (Stroke Patients Only)       Balance Overall balance assessment: Needs assistance Sitting-balance support: Feet supported Sitting balance-Leahy Scale: Fair     Standing balance support: Bilateral upper extremity supported Standing balance-Leahy Scale: Poor Standing balance comment: external support required. standing tolerance of 1.5 minutes performed x 3 bouts                            Cognition Arousal/Alertness: Awake/alert Behavior During Therapy: [facility]  for tasks assessed/performed Overall Cognitive Status: No family/caregiver present to determine  baseline cognitive functioning                       Memory: Decreased recall of precautions, Decreased short-term memory Following Commands: Follows one step commands consistently Safety/Judgement: Decreased awareness of deficits, Decreased awareness of safety   Problem Solving: Slow processing, Requires verbal cues, Requires tactile cues          Exercises      General Comments General comments (skin integrity, edema, etc.): orthostatic vitals taken during session. supine 121/67, sitting 109/62, standing 0 minutes 79/60, unable to stand long enough for 3 minute vitals      Pertinent Vitals/Pain Pain Assessment Pain Assessment: No/denies pain    Home Living     Available Help at Discharge: Family;Available 24 hours/day Type of Home: House                  Prior Function            PT Goals (current goals can now be found in the care plan section) Acute Rehab PT Goals Patient Stated Goal: to walk PT Goal Formulation: With patient Time For Goal Achievement: 12/09/22 Potential to Achieve Goals: Good Progress towards PT goals: Progressing toward goals    Frequency    Min 3X/week      PT Plan Current plan remains appropriate    Co-evaluation              AM-PAC PT "6 Clicks" Mobility   Outcome Measure  Help needed turning from your back to your side while in a flat bed without using bedrails?: A Little Help needed moving from lying on your back to sitting on the side of a flat bed without using bedrails?: A Little Help needed moving to and from a bed to a chair (including a wheelchair)?: A Little Help needed standing up from a chair using your arms (e.g., wheelchair or bedside chair)?: A Lot Help needed to walk in hospital room?: A Little Help needed climbing 3-5 steps with a railing? : A Lot 6 Click Score: 16    End of Session   Activity Tolerance: Patient tolerated treatment well Patient left: in bed;with call bell/phone within  reach;with bed alarm set   PT Visit Diagnosis: Other abnormalities of gait and mobility (R26.89);Unsteadiness on feet (R26.81);Muscle weakness (generalized) (M62.81)     Time: 2426-8341 PT Time Calculation (min) (ACUTE ONLY): 26 min  Charges:  $Gait Training: 8-22 mins $Therapeutic Activity: 8-22 mins                    Minna Merritts, PT, MPT    Percell Locus 12/06/2022, 12:15 PM

## 2022-12-06 NOTE — Evaluation (Signed)
Speech Language Pathology Evaluation Patient Details Name: Leslie [name] MRN: [id number] DOB: January 17, 1941 Today's Date: 12/06/2022 Time: 0900-0920 SLP Time Calculation (min) (ACUTE ONLY): 20 min  Problem List:  Patient Active Problem List   Diagnosis Date Noted   Paroxysmal A-fib ([place]) 11/29/2022   Pressure injury of skin 11/29/2022   Cerebrovascular accident (CVA) ([place]) 11/29/2022   Protein-calorie malnutrition, severe 11/23/2022   Hypotension 11/23/2022   CKD (chronic kidney disease) 11/23/2022   History of CVA with residual deficit 11/23/2022   Heart failure with reduced ejection fraction ([place]) 11/23/2022   Acute on chronic systolic CHF (congestive heart failure) ([place]) 11/07/2022   COPD (chronic obstructive pulmonary disease) ([place]) 11/07/2022   Hypothyroidism 11/07/2022   Middle cerebral artery embolism, right 11/03/2022   Community acquired pneumonia of left lower lobe of lung 10/30/2022   Pleural effusion 10/30/2022   Atrial fibrillation with rapid ventricular response ([place]) 10/29/2022   Osteoarthritis of left hip 10/05/2021   Past Medical History:  Past Medical History:  Diagnosis Date   Arthritis    Back pain due to injury    fell off a ladder and fractured back-T12 down (multiple surgeries w/ onging pain)   Chronic kidney disease    "stage 3 kidney failure" improving per pt   Hypothyroidism    Insomnia    Plantar fasciitis    Stomach ulcer ~2000   Stroke ([place])    TIA X2   2001, jan 2017   Vertigo, benign paroxysmal, bilateral    Vitamin D deficiency    Past Surgical History:  Past Surgical History:  Procedure Laterality Date   ABDOMINAL HYSTERECTOMY  1974   ANKLE FUSION Right 06/07/2016   Procedure: RIGHT TALONAVICULAR ARTHRODESIS ;  Surgeon: Wylene Simmer, MD;  Location: [place];  Service: Orthopedics;  Laterality: Right;   APPENDECTOMY     BACK SURGERY     x4  fusion T12 thru sacrum; U rod in sacrum   BUBBLE STUDY  11/26/2022    Procedure: BUBBLE STUDY;  Surgeon: Elouise Munroe, MD;  Location: [place];  Service: Cardiovascular;;   CARDIOVERSION N/A 11/26/2022   Procedure: CARDIOVERSION;  Surgeon: Elouise Munroe, MD;  Location: [place];  Service: Cardiovascular;  Laterality: N/A;   CATARACT EXTRACTION, BILATERAL Bilateral 2015   GASTROCNEMIUS RECESSION Right 06/07/2016   Procedure: RIGHT GASTROC RECESSION ;  Surgeon: Wylene Simmer, MD;  Location: [place];  Service: Orthopedics;  Laterality: Right;   HAND SURGERY Left 2003   x2   IR ANGIO EXTRACRAN SEL COM CAROTID INNOMINATE UNI L MOD SED  11/03/2022   IR CT HEAD LTD  11/03/2022   IR PERCUTANEOUS ART THROMBECTOMY/INFUSION INTRACRANIAL INC DIAG ANGIO  11/03/2022   L heel surgery for torn tendon Left 2015   PLANTAR FASCIA SURGERY Right 2009   surgery at [place] 11/03/2022   Procedure: IR WITH ANESTHESIA;  Surgeon: Radiologist, Medication, MD;  Location: [place];  Service: Radiology;  Laterality: N/A;   SHOULDER ARTHROSCOPY Right 2007   TEE WITHOUT CARDIOVERSION N/A 11/26/2022   Procedure: TRANSESOPHAGEAL ECHOCARDIOGRAM (TEE);  Surgeon: Elouise Munroe, MD;  Location: [facility] ENDOSCOPY;  Service: Cardiovascular;  Laterality: N/A;   TENDON REPAIR Right 1949   foot- lacerated tendon in arch of foot   THORACENTESIS N/A 10/30/2022   Procedure: THORACENTESIS;  Surgeon: Julian Hy, DO;  Location: [place];  Service: Cardiopulmonary;  Laterality: N/A;   TOTAL HIP ARTHROPLASTY Left 10/05/2021   Procedure:  TOTAL HIP ARTHROPLASTY ANTERIOR APPROACH;  Surgeon: Rod Can, MD;  Location: [facility];  Service: Orthopedics;  Laterality: Left;   HPI:  81yo female re-admitted from CIR 11/23/22 with aflutter, HR 160's, low blood pressures (84/50).  PMH: CVA (12/2015), TIAx2 (2001), Aflutter/fib, hypothyroidism, GERD, arthritis, CKD, plantar fasciitis, stomach ulcer.   Assessment / Plan / Recommendation Clinical  Impression  Pt seen at bedside today to assess cognitive linguistic function. Pt's receptive and expressive language appear intact. Pt's speech is fully intelligible, and is at an appropriate loudness level. The Mini-Mental State Exam (MMSE) was administered to assess cognition. Pt scored 19/30, raising concerns for cognitive deficits. Pt exhibited difficulty on orientation, delayed recall, attention, following 3-step commands, writing, and figure copying. Pt would benefit from continued skilled ST services to maximize cognition and reduce caregiver burden.    SLP Assessment  SLP Recommendation/Assessment: Patient needs continued Speech Language Pathology Services  SLP Visit Diagnosis: Cognitive communication deficit (R41.841)    Recommendations for follow up therapy are one component of a multi-disciplinary discharge planning process, led by the attending physician.  Recommendations may be updated based on patient status, additional functional criteria and insurance authorization.    Follow Up Recommendations  Follow physician's recommendations for discharge plan and follow up therapies    Assistance Recommended at Discharge  Frequent or constant Supervision/Assistance  Functional Status Assessment Patient has had a recent decline in their functional status and/or demonstrates limited ability to make significant improvements in function in a reasonable and predictable amount of time   Frequency and Duration min 1 x/week  2 weeks      SLP Evaluation Cognition  Overall Cognitive Status: No family/caregiver present to determine baseline cognitive functioning Arousal/Alertness: Awake/alert Orientation Level: Oriented to person;Oriented to place;Disoriented to time;Disoriented to situation Year: 2023 Month: December Day of Week: Incorrect Attention: Focused;Sustained Focused Attention: Appears intact Sustained Attention: Impaired Memory: Impaired Memory Impairment: Storage  deficit;Retrieval deficit;Decreased recall of new information Awareness: Impaired Awareness Impairment: Intellectual impairment;Emergent impairment Problem Solving: Impaired Problem Solving Impairment: Functional basic;Verbal basic Executive Function: Reasoning;Sequencing;Organizing;Decision Making;Self Monitoring;Self Correcting Reasoning: Impaired Reasoning Impairment: Verbal basic;Functional basic Sequencing: Impaired Sequencing Impairment: Verbal basic;Functional basic Organizing: Impaired Organizing Impairment: Verbal basic;Functional basic Decision Making: Impaired Decision Making Impairment: Verbal basic;Functional basic Initiating: Impaired Initiating Impairment: Verbal basic;Functional basic Self Monitoring: Impaired Self Monitoring Impairment: Verbal basic;Functional basic Self Correcting: Impaired Self Correcting Impairment: Verbal basic;Functional basic Behaviors: Perseveration Safety/Judgment: Impaired       Comprehension  Auditory Comprehension Overall Auditory Comprehension: Appears within functional limits for tasks assessed    Expression Expression Primary Mode of Expression: Verbal Verbal Expression Overall Verbal Expression: Appears within functional limits for tasks assessed Written Expression Dominant Hand: Right   Oral / Motor  Oral Motor/Sensory Function Overall Oral Motor/Sensory Function: Within functional limits Motor Speech Overall Motor Speech: Appears within functional limits for tasks assessed Articulation: Within functional limitis           Linzy Darling B. Quentin Ore, [facility], [place] Speech Language Pathologist Office: [phone]  Shonna Chock 12/06/2022, 9:54 AM

## 2022-12-06 NOTE — TOC Progression Note (Signed)
Transition of Care [facility]) - Progression Note    Patient Details  Name: Leslie [name] MRN: [id number] Date of Birth: 06/08/1941  Transition of Care [facility]) CM/SW Contact  Graves-Bigelow, Ocie Cornfield, RN Phone Number: 12/06/2022, 12:42 PM  Clinical Narrative:  CIR is following- awaiting insurance authorization at this time. Case Manager will continue to follow for transition of care needs.    Expected Discharge Plan: [place] Barriers to Discharge: No Barriers Identified  Expected Discharge Plan and Services In-house Referral: NA Discharge Planning Services: CM Consult Post Acute Care Choice: IP Rehab Living arrangements for the past 2 months: Single Family Home (previous from CIR.) Expected Discharge Date: 12/03/22                Social Determinants of Health ([place]) Interventions [place]: No Food Insecurity (11/29/2022)  Housing: Low Risk  (11/29/2022)  Transportation Needs: No Transportation Needs (11/29/2022)  Utilities: Not At Risk (11/29/2022)  Tobacco Use: Medium Risk (12/05/2022)    Readmission Risk Interventions     No data to display

## 2022-12-06 NOTE — Progress Notes (Signed)
Rounding Note    Patient Name: Leslie [name] Date of Encounter: 12/06/2022  [place] Cardiologist: Peter Martinique, MD   Subjective   SR after DCCV yesterday. Feeling brighter and better. Awaiting decision about return to CIR.  Inpatient Medications    Scheduled Meds:  amiodarone  200 mg Oral Daily   amoxicillin  500 mg Oral Q8H   apixaban  5 mg Oral BID   vitamin B-12  1,000 mcg Oral Daily   famotidine  20 mg Oral Daily   feeding supplement  237 mL Oral TID BM   Gerhardt's butt cream   Topical BID   levothyroxine  100 mcg Oral Q0600   midodrine  5 mg Oral TID WC   mouth rinse  15 mL Mouth Rinse 4 times per day   rosuvastatin  10 mg Oral Daily   senna-docusate  2 tablet Oral BID   Continuous Infusions:     PRN Meds: acetaminophen, alum & mag hydroxide-simeth, bisacodyl **OR** sorbitol, calcium carbonate, camphor-menthol, guaiFENesin-dextromethorphan, ipratropium-albuterol, melatonin, ondansetron (ZOFRAN) IV, mouth rinse, prochlorperazine **OR** prochlorperazine **OR** prochlorperazine, sodium phosphate   Vital Signs    Vitals:   12/05/22 1335 12/05/22 1632 12/05/22 1945 12/06/22 0430  BP: 118/82 (!) 142/72 123/70   Pulse:   70 63  Resp: '16 20 15 15  '$ Temp: 98.1 F (36.7 C)  98.1 F (36.7 C) 98.1 F (36.7 C)  TempSrc: Oral  Oral Oral  SpO2:   100%   Weight:    66.2 kg  Height:        Intake/Output Summary (Last 24 hours) at 12/06/2022 0936 Last data filed at 12/06/2022 0859 Gross per 24 hour  Intake --  Output 630 ml  Net -630 ml       12/06/2022    4:30 AM 12/05/2022    6:19 AM 12/04/2022    4:50 AM  Last 3 Weights  Weight (lbs) 146 lb 147 lb 4.3 oz 145 lb 12.8 oz  Weight (kg) 66.225 kg 66.8 kg 66.134 kg      Telemetry    SR- Personally Reviewed  ECG    SR- Personally Reviewed  Physical Exam   GEN: laying in bed comfortable. Neck: No JVD Cardiac: Regular rhythm, faint systolic murmur at apex Respiratory: Clear  bilaterally GI: Soft, nontender, non-distended  MS: No edema; No deformity. Neuro:  Nonfocal  Psych: Normal affect   Labs    High Sensitivity Troponin:   Recent Labs  Lab 11/29/22 0241 11/29/22 0551  TROPONINIHS 8 8     Chemistry Recent Labs  Lab 12/01/22 0138 12/05/22 0358 12/06/22 0341  NA 140 141 139  K 4.5 4.0 4.0  CL 108 105 104  CO2 '25 26 26  '$ GLUCOSE 96 95 82  BUN '11 12 13  '$ CREATININE 1.13* 1.27* 1.21*  CALCIUM 9.6 9.7 9.6  MG  --  2.1 2.1  PROT  --   --  6.1*  ALBUMIN  --   --  3.4*  AST  --   --  30  ALT  --   --  22  ALKPHOS  --   --  52  BILITOT  --   --  1.2  GFRNONAA 49* 43* 45*  ANIONGAP '7 10 9    '$ Lipids No results for input(s): "CHOL", "TRIG", "HDL", "LABVLDL", "LDLCALC", "CHOLHDL" in the last 168 hours.  Hematology Recent Labs  Lab 11/30/22 0700 12/01/22 0138 12/05/22 0358 12/06/22 0341  WBC 9.7  --  10.1 11.3*  RBC 4.12  --  4.46 4.14  HGB 11.5* 13.2 12.4 11.7*  HCT 38.1 42.2 39.2 36.5  MCV 92.5  --  87.9 88.2  MCH 27.9  --  27.8 28.3  MCHC 30.2  --  31.6 32.1  RDW 15.9*  --  15.8* 15.7*  PLT 155  --  232 212   Thyroid  No results for input(s): "TSH", "FREET4" in the last 168 hours.   BNPNo results for input(s): "BNP", "PROBNP" in the last 168 hours.  DDimer No results for input(s): "DDIMER" in the last 168 hours.   Radiology    No results found.  Cardiac Studies  TEE 11/26/22: IMPRESSIONS     1. Left ventricular ejection fraction, by estimation, is 20%. The left  ventricle has severely decreased function.   2. Right ventricular systolic function is severely reduced. The right  ventricular size is mildly enlarged.   3. Left atrial size was moderately dilated. No left atrial/left atrial  appendage thrombus was detected. The LAA emptying velocity was 35 cm/s.   4. A small pericardial effusion is present.   5. The mitral valve is grossly normal. Severe mitral valve regurgitation,  likely secondary to LV dysfunction and  atrial functional MR. Multiple  jets, appears commissural.   6. The aortic valve is grossly normal. Aortic valve regurgitation is  mild.   7. There is mild (Grade II) atheroma plaque involving the aortic arch and  descending aorta.   Conclusion(s)/Recommendation(s): No LA/LAA thrombus identified. Successful  cardioversion performed with restoration of normal sinus rhythm.    Patient Profile     81 y.o. female with a history of paroxysmal atrial fibrillation on Eliquis, chronic systolic CHF with EF of 99-35%, mitral regurgitation, CKD stage III, hypothyroidism, GERD, and multiple CVA with recent stroke on 11/03/2022 s/p thrombectomy. Patient was recently admitted from 10/29/2022 to 11/08/2022 for new onset atrial fibrillation and was found to have a reduced EF of 40-45%. Hospitalization was complicated by community acquired pneumonia (which was initially diagnosed prior to this admission) with right pleural effusion s/p thoracentesis on 10/30/2022, and acute stroke requiring thrombectomy. She was discharged to CIR on 11/08/2022. She went back into rapid atrial fibrillation with rates in the 150s on 11/23/2022 and was hypotensive with it. Therefore, she was readmitted as an inpatient and Cardiology consulted for assistance.   Assessment & Plan     #Persistent Atrial Fibrillation:  Patient with recent admission for stroke found to have new onset atrial fibrillation. Echo showed LVEF of 40-45%. She was started on Diltiazem and apixaban. She was discharged to CIR but represented on this admission for recurrent rapid atrial fibrillation with rates in the 150s with associated hypotension for which Cardiology was consulted. Given difficulty with rate control and soft blood pressures limiting nodal agents, she underwent TEE/DCCV on 11/26/22 with initial return to NSR but developed recurrent RVR on 12/20   - S/p successful TEE/DCCV on 11/26/22 but developed recurrent RVR on 12/20, after amio load, repeat  successful DCCV 12/05/22 - amio 200 mg daily ongoing, indefinite to be determined as outpatient for duration. - Cannot tolerate nodal agents due to significant orthostatic hypotension - Continue Eliquis '5mg'$  BID  #Episode of decreased responsiveness: #Recent MCA CVA Patient had episode of decreased responsiveness with nonsensical speech for a brief period. Underwent CT head which showed new hypodensity in the right insular region. Follow-up brain MRI fortunately with no acute finding. Had progression of right MCA territory infract but was subacute  to chronic. MRA with no recurrent occlusion or proximal flow limiting stenosis. -Management per neuro and primary  #Chronic Systolic CHF:  Echo during recent admission showed LVEF of 40-45% with global hypokinesis , moderate asymmetric LVH of the basal-septal segment and moderate to severe MR. TEE on 11/26/22 with interval drop in EF to 20% in the setting of Afib with RVR. Currently euvolemic on exam. On amiodarone for recurrent RVR. -TEE with interval drop in EF to 20% in the setting of Afib with RVR -Unable to tolerate GDMT due to orthostatic hypotension  - repeat TTE shows similarly depressed EF, likely 25-30%. Needs rate and rhythm control as much as possible.  -will likely repeat echo next week if she maintains SR, can be done at CIR.   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 Dilt stopped and midodrine started. Continues to have significant orthostasis limiting medical therapy. -Continue midodrine '5mg'$  TID as tolerated. Now back in SR, can reassess dosing. -Continue compression socks and abdominal binder   #Severe MR: Severe on TEE. Likely functional + atrial functional.  - repeat echo as above.  Cardiology will follow peripherally at this point.   For questions or updates, please contact [place] Please consult  [email or web address] for contact info under        Signed, Elouise Munroe, MD  12/06/2022, 9:36 AM

## 2022-12-06 NOTE — Progress Notes (Signed)
PROGRESS NOTE    Leslie [name]  JJO:[id number] DOB: [birth year] DOA: 11/23/2022 PCP: Sonia Side., FNP  No chief complaint on file.   Brief Narrative:  Leslie [name] is an 81 y.o. female with Leslie [name] history of hypothyroidism, GERD who presented to the ED on 10/29/2022 from PCP's office after finding new AFib with RVR. There was noted to have pneumonia with right pleural effusion treated with antibiotics and thoracentesis (transudate, negative Cx and cytology). Cardiology was consulted, eliquis started, and ultimately got rates under adequate control. On 11/25 she developed right gaze deviation, left hemiparesis and neglect due to right M1 occlusion, taken emergently for mechanical thrombectomy which restored TICI 3 flow. She stabilized, ultimately transferring to CIR 11/30. While undergoing therapy, diltiazem dose was decreased due to orthostatic hypotension. She went recurrently into rapid atrial fibrillation with hypotension prompting readmission on 12/15. Subsequently underwent TEE/DCCV 12/18. Plan was to transfer back to CIR, though she's recurrently in rapid atrial fibrillation again 12/20. Amiodarone gtt initiated, ultimately tapered. the patient's atrial fibrillation/flutter rate is improved but not tolerated well. DCCV is planned 12/27. Updated PT/OT recommendations are needed to return to CIR.   Assessment & Plan:   Principal Problem:   Paroxysmal [name] Active Problems:   Atrial fibrillation with rapid ventricular response ([name])   Hypotension   Heart failure with reduced ejection fraction ([name])   History of CVA with residual deficit   COPD (chronic obstructive pulmonary disease) ([name])   Hypothyroidism   Pressure injury of skin   Cerebrovascular accident (CVA) ([place])  Paroxysmal atrial fibrillation/flutter with RVR: s/p TEE/DCCV 12/18, reverted NSR > AFib 12/20. Does not tolerate atrial fibrillation well. Options limited with hypotension.  - Atrial flutter is  persistent despite ongoing amiodarone and she seems to have persistent symptoms attributable to this.  - s/p DCCV 12/27 - Cardiology assistance appreciated. Continue amiodarone with oral loading, '400mg'$  BID, will start maintenance dose '200mg'$  daily 12/27. prn digoxin, not currently required.  - Goal potassium at least 4 and magnesium at least 2. Recheck labs in AM. - Continue Eliquis   Delirium  Concern for Cognitive Deficit: Improving. Ammonia noted to be 36 (ULN is 35). No asterixis on exam, no other hepatic dysfunction or lethargy.  - Continue delirium precautions. Mobilize as much as possible, may go into hallway in [facility].  - Tx possible UTI as below.  - MMSE 19/30 12/28 with SLP   Tremor - seems new, more left sided - ? If this is medication related, will review with pharmacy (amiodarone, synthroid can worsen physiologic tremor) - if persistent, would warrant follow up with neurology (? 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  - Continue anticoagulation, statin.  - Continue PT/OT/SLP. Updated rec's needed to restart insurance authorization process.   - New stroke ruled out: Initial concern after brief unresponsive episode with right insular hypodensity on CT 12/21, no acute findings on subsequent MRI/MRA. No seizure seen on EEG. Mentation and neurological status is back to her recent baseline. No syncope.  - Avoid hypotension   Dysphagia - will ask SLP to reeval, concerns regarding swallowing per patient -> no witnessed concerns 12/28 per discussion with SLP  Enterococcus UTI:  - amoxicillin per discussion with family and culture results (enterococcus)   Orthostatic Hypotension  Hypotension: - Continue midodrine (will increase dose with SBP down to 70's with standing today) - Continue TED hose/abdominal binder.  -  follow orthostatics daily    HFrEF, tachycardia-mediated cardiomyopathy, severe  MR: LVEF 40-45% with global hypokinesis. TEE in setting of AFib with RVR noted interval worsening LVEF to 20%.  - Monitor I/O, weights, BMP.  - Consider ischemic evaluation eventually.     Vitamin B12 deficiency: B12 low normal. MMA elevated. - Continue B12 supplementation   COPD: Patient without acute exacerbation.   Hypothyroidism: TSH 11.301.   - Continue levothyroxine (increased dose)    DVT prophylaxis: eliquis Code Status: full Family Communication: daughter Disposition:   Status is: Inpatient Remains inpatient appropriate because: working on placement   Consultants:  cardiology  Procedures:  12/27 Plan: Successful DCCV with Leslie [name] single 150J shock to NSR.  12/20 Echo IMPRESSIONS     1. Limited study with only 45 images, technically difficult   2. Left ventricular ejection fraction, by estimation, is 40 to 45%. The  left ventricle has mildly decreased function. The left ventricle  demonstrates global hypokinesis. There is mild asymmetric left ventricular  hypertrophy. Left ventricular diastolic  function could not be evaluated.   3. The mitral valve is abnormal. Mild to moderate mitral valve  regurgitation.   4. Rhythm strip during this exam demonstrates atrial fib with rapid  ventricular response.   5. The aortic valve is tricuspid. Aortic valve regurgitation is not  visualized.   6. Right ventricular systolic function was not well visualized. The right  ventricular size is not well visualized.   Comparison(s): Changes from prior study are noted. 10/30/2022: LVEF  40-45%, modearate to severe MR.   Conclusion(s)/Recommendation(s): Consider repeat limited echo when the HR  is lower.   12/18 IMPRESSIONS     1. Left ventricular ejection fraction, by estimation, is 20%. The left  ventricle has severely decreased function.   2. Right ventricular systolic function is severely reduced. The right  ventricular size is mildly enlarged.   3. Left atrial size was  moderately dilated. No left atrial/left atrial  appendage thrombus was detected. The LAA emptying velocity was 35 cm/s.   4. Leslie [name] small pericardial effusion is present.   5. The mitral valve is grossly normal. Severe mitral valve regurgitation,  likely secondary to LV dysfunction and atrial functional MR. Multiple  jets, appears commissural.   6. The aortic valve is grossly normal. Aortic valve regurgitation is  mild.   7. There is mild (Grade II) atheroma plaque involving the aortic arch and  descending aorta.   Conclusion(s)/Recommendation(s): No LA/LAA thrombus identified. Successful  cardioversion performed with restoration of normal sinus rhythm.   11/21 IMPRESSIONS     1. Left ventricular ejection fraction, by estimation, is 40 to 45%. The  left ventricle has mildly decreased function. The left ventricle  demonstrates global hypokinesis. There is moderate asymmetric left  ventricular hypertrophy of the basal-septal  segment. Left ventricular diastolic parameters are indeterminate.   2. Right ventricular systolic function is normal. The right ventricular  size is normal. There is normal pulmonary artery systolic pressure.   3. Left atrial size was mildly dilated.   4. The mitral valve is abnormal. Moderate to severe mitral valve  regurgitation. No evidence of mitral stenosis.   5. The aortic valve was not well visualized. Aortic valve regurgitation  is trivial. No aortic stenosis is present.   6. The inferior vena cava is dilated in size with >50% respiratory  variability, suggesting right atrial pressure of 8 mmHg.   Comparison(s): MR is worse from prior report.  Antimicrobials:  Anti-infectives (From admission, onward)  Start     Dose/Rate Route Frequency Ordered Stop   12/01/22 1800  amoxicillin (AMOXIL) capsule 500 mg        500 mg Oral Every 8 hours 12/01/22 1152     12/01/22 0730  amoxicillin (AMOXIL) capsule 500 mg  Status:  Discontinued        500 mg Oral Every 8  hours 12/01/22 0637 12/01/22 1152       Subjective: Asking for help sitting up in bed  Objective: Vitals:   12/05/22 0850 12/05/22 0900 12/05/22 1300 12/05/22 1335  BP: 133/81 129/82 132/79 118/82  Pulse: 64 67    Resp: '14 14 13 16  '$ Temp:    98.1 F (36.7 C)  TempSrc:    Oral  SpO2: 100% 100%    Weight:      Height:        Intake/Output Summary (Last 24 hours) at 12/05/2022 1605 Last data filed at 12/05/2022 0836 Gross per 24 hour  Intake 460 ml  Output --  Net 460 ml   Filed Weights   12/03/22 0506 12/04/22 0450 12/05/22 0619  Weight: 67 kg 66.1 kg 66.8 kg    Examination:  General: No acute distress. Cardiovascular: RR Lungs: unlabored, diminished Abdomen: Soft, nontender, nondistended Neurological: Alert , but mildly confused, distracted. Moves all extremities 4. Cranial nerves II through XII grossly intact.  Mild L sided tremor. Extremities: No clubbing or cyanosis. No edema  Data Reviewed: I have personally reviewed following labs and imaging studies  CBC: Recent Labs  Lab 11/29/22 0241 11/30/22 0700 12/01/22 0138 12/05/22 0358  WBC 10.8* 9.7  --  10.1  HGB 13.0 11.5* 13.2 12.4  HCT 40.9 38.1 42.2 39.2  MCV 87.2 92.5  --  87.9  PLT 204 155  --  888    Basic Metabolic Panel: Recent Labs  Lab 11/29/22 0241 11/30/22 0700 11/30/22 0940 12/01/22 0138 12/05/22 0358  NA 139 131* 138 140 141  K 3.4* 3.3* 3.7 4.5 4.0  CL 108 86* 105 108 105  CO2 23 20* '24 25 26  '$ GLUCOSE 118* 719* 113* 96 95  BUN '9 9 10 11 12  '$ CREATININE 1.15* 1.14* 1.14* 1.13* 1.27*  CALCIUM 9.6 8.2* 9.7 9.6 9.7  MG 2.0  --   --   --  2.1    GFR: Estimated Creatinine Clearance: 35 mL/min (Leslie [name]) (by C-G formula based on SCr of 1.27 mg/dL (H)).  Liver Function Tests: No results for input(s): "AST", "ALT", "ALKPHOS", "BILITOT", "PROT", "ALBUMIN" in the last 168 hours.  CBG: Recent Labs  Lab 11/28/22 2313 11/30/22 0814  GLUCAP 143* 108*     Recent Results (from the  past 240 hour(s))  Urine Culture     Status: Abnormal   Collection Time: 11/28/22 12:24 AM   Specimen: Urine, Clean Catch  Result Value Ref Range Status   Specimen Description URINE, CLEAN CATCH  Final   Special Requests   Final    NONE Performed at [facility], [place] [address] [place], [place] [address]    Culture 60,000 COLONIES/mL ENTEROCOCCUS FAECALIS (Leslie [name])  Final   Report Status 11/30/2022 FINAL  Final   Organism ID, Bacteria ENTEROCOCCUS FAECALIS (Leslie [name])  Final      Susceptibility   Enterococcus faecalis - MIC*    AMPICILLIN <=2 SENSITIVE Sensitive     NITROFURANTOIN <=16 SENSITIVE Sensitive     VANCOMYCIN 1 SENSITIVE Sensitive     * 60,000 COLONIES/mL ENTEROCOCCUS FAECALIS  Radiology Studies: No results found.      Scheduled Meds:  amiodarone  200 mg Oral Daily   amoxicillin  500 mg Oral Q8H   apixaban  5 mg Oral BID   vitamin B-12  1,000 mcg Oral Daily   famotidine  20 mg Oral Daily   feeding supplement  237 mL Oral TID BM   Gerhardt's butt cream   Topical BID   levothyroxine  100 mcg Oral Q0600   midodrine  5 mg Oral TID WC   mouth rinse  15 mL Mouth Rinse 4 times per day   rosuvastatin  10 mg Oral Daily   senna-docusate  2 tablet Oral BID   Continuous Infusions:   LOS: 12 days    Time spent: over 30 min    Leslie Helper, Leslie [name] [facility]   To contact the attending provider between 7A-7P or the covering provider during after hours 7P-7A, please log into the web site [email or web address] and access using universal [place] password for that web site. If you do not have the password, please call the hospital operator.  12/05/2022, 4:05 PM

## 2022-12-06 NOTE — Evaluation (Signed)
Clinical/Bedside Swallow RE-Evaluation Patient Details  Name: Leslie [name] MRN: [id number] Date of Birth: 08-Mar-1941  Today's Date: 12/06/2022 Time: SLP Start Time (ACUTE ONLY): 0835 SLP Stop Time (ACUTE ONLY): 0900 SLP Time Calculation (min) (ACUTE ONLY): 25 min  Past Medical History:  Past Medical History:  Diagnosis Date   Arthritis    Back pain due to injury    fell off a ladder and fractured back-T12 down (multiple surgeries w/ onging pain)   Chronic kidney disease    "stage 3 kidney failure" improving per pt   Hypothyroidism    Insomnia    Plantar fasciitis    Stomach ulcer ~2000   Stroke [facility])    TIA X2   2001, jan 2017   Vertigo, benign paroxysmal, bilateral    Vitamin D deficiency    Past Surgical History:  Past Surgical History:  Procedure Laterality Date   ABDOMINAL HYSTERECTOMY  1974   ANKLE FUSION Right 06/07/2016   Procedure: RIGHT TALONAVICULAR ARTHRODESIS ;  Surgeon: Wylene Simmer, MD;  Location: [place];  Service: Orthopedics;  Laterality: Right;   APPENDECTOMY     BACK SURGERY     x4  fusion T12 thru sacrum; U rod in sacrum   BUBBLE STUDY  11/26/2022   Procedure: BUBBLE STUDY;  Surgeon: Elouise Munroe, MD;  Location: [place];  Service: Cardiovascular;;   CARDIOVERSION N/A 11/26/2022   Procedure: CARDIOVERSION;  Surgeon: Elouise Munroe, MD;  Location: [place];  Service: Cardiovascular;  Laterality: N/A;   CATARACT EXTRACTION, BILATERAL Bilateral 2015   GASTROCNEMIUS RECESSION Right 06/07/2016   Procedure: RIGHT GASTROC RECESSION ;  Surgeon: Wylene Simmer, MD;  Location: [place];  Service: Orthopedics;  Laterality: Right;   HAND SURGERY Left 2003   x2   IR ANGIO EXTRACRAN SEL COM CAROTID INNOMINATE UNI L MOD SED  11/03/2022   IR CT HEAD LTD  11/03/2022   IR PERCUTANEOUS ART THROMBECTOMY/INFUSION INTRACRANIAL INC DIAG ANGIO  11/03/2022   L heel surgery for torn tendon Left 2015   PLANTAR FASCIA  SURGERY Right 2009   surgery at [place] 11/03/2022   Procedure: IR WITH ANESTHESIA;  Surgeon: Radiologist, Medication, MD;  Location: [place];  Service: Radiology;  Laterality: N/A;   SHOULDER ARTHROSCOPY Right 2007   TEE WITHOUT CARDIOVERSION N/A 11/26/2022   Procedure: TRANSESOPHAGEAL ECHOCARDIOGRAM (TEE);  Surgeon: Elouise Munroe, MD;  Location: [facility];  Service: Cardiovascular;  Laterality: N/A;   TENDON REPAIR Right 1949   foot- lacerated tendon in arch of foot   THORACENTESIS N/A 10/30/2022   Procedure: THORACENTESIS;  Surgeon: Julian Hy, DO;  Location: [place];  Service: Cardiopulmonary;  Laterality: N/A;   TOTAL HIP ARTHROPLASTY Left 10/05/2021   Procedure: TOTAL HIP ARTHROPLASTY ANTERIOR APPROACH;  Surgeon: Rod Can, MD;  Location: [facility];  Service: Orthopedics;  Laterality: Left;   HPI:  81yo female re-admitted from CIR 11/23/22 with aflutter, HR 160's, low blood pressures (84/50).  PMH: CVA (12/2015), TIAx2 (2001), Aflutter/fib, hypothyroidism, GERD, arthritis, CKD, plantar fasciitis, stomach ulcer.    Assessment / Plan / Recommendation  Clinical Impression  Pt was seen at bedside for re-evaluation of swallow function and safety. Pt was seated upright in bed, breakfast on tray table at side of bed. Pt was noted to have her gown upside down, This was corrected after breakfast.  Pt was observed during breakfast, including scrambled eggs, sausage, biscuit, coffee, and water. Pt exhibited slight dry  cough x1, but not timed with PO trial. Pt appears to tolerate Dys3 consistencies, and thin liquids, but she did not eat much. Recommend continuing with current diet (dys3/soft, thin liquids), meds crushed in puree. SLP will follow to assess diet tolerance and determine if advancement is needed.  SLP Visit Diagnosis: Dysphagia, oral phase (R13.11)    Aspiration Risk  Mild aspiration risk    Diet Recommendation Dysphagia 3 (Mech  soft);Thin liquid   Liquid Administration via: Straw;Cup Medication Administration: Crushed with puree Supervision: Patient able to self feed;Intermittent supervision to cue for compensatory strategies Compensations: Slow rate;Small sips/bites;Follow solids with liquid;Lingual sweep for clearance of pocketing;Minimize environmental distractions Postural Changes: Seated upright at 90 degrees;Remain upright for at least 30 minutes after po intake    Other  Recommendations Oral Care Recommendations: Oral care before and after PO;Oral care QID    Recommendations for follow up therapy are one component of a multi-disciplinary discharge planning process, led by the attending physician.  Recommendations may be updated based on patient status, additional functional criteria and insurance authorization.  Follow up Recommendations Follow physician's recommendations for discharge plan and follow up therapies      Assistance Recommended at Discharge Frequent/Constant  Functional Status Assessment Patient has had a recent decline in their functional status and/or demonstrates limited ability to make significant improvements in function in a reasonable and predictable amount of time  Frequency and Duration min 1 x/week  1 week;2 weeks       Prognosis Prognosis for Safe Diet Advancement: Fair Barriers to Reach Goals: Cognitive deficits      Swallow Study   General Date of Onset: 11/23/22 HPI: 81yo female re-admitted from [facility] 11/23/22 with aflutter, HR 160's, low blood pressures (84/50).  PMH: CVA (12/2015), TIAx2 (2001), Aflutter/fib, hypothyroidism, GERD, arthritis, CKD, plantar fasciitis, stomach ulcer. Type of Study: Bedside Swallow Evaluation Previous Swallow Assessment: MBS11/27/23 Diet Prior to this Study: Dysphagia 3 (soft);Thin liquids Temperature Spikes Noted: No Respiratory Status: Room air History of Recent Intubation: No Behavior/Cognition: Alert;Cooperative;Pleasant  mood;Distractible Oral Cavity Assessment: Within Functional Limits Oral Care Completed by SLP: No Oral Cavity - Dentition: Adequate natural dentition Vision: Functional for self-feeding Self-Feeding Abilities: Able to feed self;Needs set up Patient Positioning: Upright in bed Baseline Vocal Quality: Normal Volitional Cough:  (Fair) Volitional Swallow: Able to elicit    Oral/Motor/Sensory Function Overall Oral Motor/Sensory Function: Within functional limits   Ice Chips Ice chips: Not tested   Thin Liquid Thin Liquid: Within functional limits Presentation: Cup;Straw Oral Phase Functional Implications: Prolonged oral transit;Oral residue Pharyngeal  Phase Impairments: Suspected delayed Swallow    Nectar Thick Nectar Thick Liquid: Not tested   Honey Thick Honey Thick Liquid: Not tested   Puree Puree: Within functional limits Presentation: Self Fed;Spoon   Solid     Solid: Within functional limits Presentation: Self Fed Oral Phase Functional Implications: Oral residue     Leslie [name] B. Quentin Ore, [facility], [place] Speech Language Pathologist Office: [phone]  Shonna Chock 12/06/2022,9:39 AM

## 2022-12-06 NOTE — Progress Notes (Signed)
Inpatient Rehabilitation Admissions Coordinator   I await insurance determination for a possible CIR admit.  Danne Baxter, RN, MSN Rehab Admissions Coordinator [phone] 12/06/2022 12:14 PM

## 2022-12-06 NOTE — Progress Notes (Signed)
Palliative-   Chart reviewed.  Noted awaiting insurance auth for CIR.  No acute Palliative needs at this time.   Mariana Kaufman, AGNP-C Palliative Medicine  No charge

## 2022-12-07 LAB — CBC
HCT: 35.1 % — ABNORMAL LOW (ref 36.0–46.0)
Hemoglobin: 11.4 g/dL — ABNORMAL LOW (ref 12.0–15.0)
MCH: 28.6 pg (ref 26.0–34.0)
MCHC: 32.5 g/dL (ref 30.0–36.0)
MCV: 88 fL (ref 80.0–100.0)
Platelets: 224 10*3/uL (ref 150–400)
RBC: 3.99 MIL/uL (ref 3.87–5.11)
RDW: 15.8 % — ABNORMAL HIGH (ref 11.5–15.5)
WBC: 8.6 10*3/uL (ref 4.0–10.5)
nRBC: 0 % (ref 0.0–0.2)

## 2022-12-07 LAB — BASIC METABOLIC PANEL
Anion gap: 7 (ref 5–15)
BUN: 14 mg/dL (ref 8–23)
CO2: 29 mmol/L (ref 22–32)
Calcium: 9.4 mg/dL (ref 8.9–10.3)
Chloride: 104 mmol/L (ref 98–111)
Creatinine, Ser: 1.3 mg/dL — ABNORMAL HIGH (ref 0.44–1.00)
GFR, Estimated: 42 mL/min — ABNORMAL LOW (ref >60)
Glucose, Bld: 122 mg/dL — ABNORMAL HIGH (ref 70–99)
Potassium: 3.7 mmol/L (ref 3.5–5.1)
Sodium: 140 mmol/L (ref 135–145)

## 2022-12-07 LAB — MAGNESIUM: Magnesium: 2.1 mg/dL (ref 1.7–2.4)

## 2022-12-07 LAB — GLUCOSE, CAPILLARY: Glucose-Capillary: 97 mg/dL (ref 70–99)

## 2022-12-07 LAB — PHOSPHORUS: Phosphorus: 3.5 mg/dL (ref 2.5–4.6)

## 2022-12-07 NOTE — Progress Notes (Signed)
Patient is having episodes of confusion.  She attempted to get out of bed and out of the chair without assistance, twice today.. She is asking over and over why she is here.  She wants to know why Leslie [name] left her here.

## 2022-12-07 NOTE — Progress Notes (Signed)
Inpatient Rehabilitation Admissions Coordinator   I have received a denial from [facility] for CIR admit after peer to peer with Dr Florene Glen. I have contacted pt's daughter by phone and she is aware and wishes to pursue appeal. Pam is aware that backup plan would be SNF. Patient is unaware of this denial due to her cognitive deficits. Acute team and TOC made aware.  Danne Baxter, RN, MSN Rehab Admissions Coordinator [phone] 12/07/2022 2:23 PM

## 2022-12-07 NOTE — Care Management Important Message (Signed)
Important Message  Patient Details  Name: Leslie [name] MRN: [id number] Date of Birth: 1941/02/17   Medicare Important Message Given:  Yes     [name] 12/07/2022, 12:02 PM

## 2022-12-07 NOTE — Progress Notes (Signed)
Rounding Note    Patient Name: Leslie [name] Date of Encounter: 12/07/2022  [place] Cardiologist: Peter Martinique, MD   Subjective   Maintaining SR.  Inpatient Medications    Scheduled Meds:  amiodarone  200 mg Oral Daily   amoxicillin  500 mg Oral Q8H   apixaban  5 mg Oral BID   vitamin B-12  1,000 mcg Oral Daily   famotidine  20 mg Oral Daily   feeding supplement  237 mL Oral TID BM   Gerhardt's butt cream   Topical BID   levothyroxine  100 mcg Oral Q0600   midodrine  10 mg Oral TID WC   mouth rinse  15 mL Mouth Rinse 4 times per day   rosuvastatin  10 mg Oral Daily   senna-docusate  2 tablet Oral BID   Continuous Infusions:     PRN Meds: acetaminophen, alum & mag hydroxide-simeth, bisacodyl **OR** sorbitol, calcium carbonate, camphor-menthol, guaiFENesin-dextromethorphan, ipratropium-albuterol, melatonin, ondansetron (ZOFRAN) IV, mouth rinse, prochlorperazine **OR** prochlorperazine **OR** prochlorperazine, sodium phosphate   Vital Signs    Vitals:   12/06/22 0430 12/06/22 1628 12/06/22 2035 12/07/22 0702  BP:  (!) 147/73 (!) 150/74 136/72  Pulse: 63 60 65 60  Resp: 15 14 (!) 23 18  Temp: 98.1 F (36.7 C) 97.7 F (36.5 C) (!) 97.4 F (36.3 C) 97.6 F (36.4 C)  TempSrc: Oral Oral Oral Oral  SpO2:  100% 97% 97%  Weight: 66.2 kg   65.8 kg  Height:        Intake/Output Summary (Last 24 hours) at 12/07/2022 0805 Last data filed at 12/06/2022 0859 Gross per 24 hour  Intake --  Output 625 ml  Net -625 ml       12/07/2022    7:02 AM 12/06/2022    4:30 AM 12/05/2022    6:19 AM  Last 3 Weights  Weight (lbs) 145 lb 146 lb 147 lb 4.3 oz  Weight (kg) 65.772 kg 66.225 kg 66.8 kg      Telemetry    SR- Personally Reviewed  ECG    SR- Personally Reviewed  Physical Exam   GEN: laying in bed comfortable. Neck: No JVD Cardiac: Regular rhythm, faint systolic murmur at apex Respiratory: Clear bilaterally GI: Soft, nontender,  non-distended  MS: No edema; No deformity. Neuro:  Nonfocal  Psych: Normal affect   Labs    High Sensitivity Troponin:   Recent Labs  Lab 11/29/22 0241 11/29/22 0551  TROPONINIHS 8 8     Chemistry Recent Labs  Lab 12/05/22 0358 12/06/22 0341 12/07/22 0452  NA 141 139 140  K 4.0 4.0 3.7  CL 105 104 104  CO2 '26 26 29  '$ GLUCOSE 95 82 122*  BUN '12 13 14  '$ CREATININE 1.27* 1.21* 1.30*  CALCIUM 9.7 9.6 9.4  MG 2.1 2.1 2.1  PROT  --  6.1*  --   ALBUMIN  --  3.4*  --   AST  --  30  --   ALT  --  22  --   ALKPHOS  --  52  --   BILITOT  --  1.2  --   GFRNONAA 43* 45* 42*  ANIONGAP '10 9 7    '$ Lipids No results for input(s): "CHOL", "TRIG", "HDL", "LABVLDL", "LDLCALC", "CHOLHDL" in the last 168 hours.  Hematology Recent Labs  Lab 12/05/22 0358 12/06/22 0341 12/07/22 0452  WBC 10.1 11.3* 8.6  RBC 4.46 4.14 3.99  HGB 12.4 11.7* 11.4*  HCT  39.2 36.5 35.1*  MCV 87.9 88.2 88.0  MCH 27.8 28.3 28.6  MCHC 31.6 32.1 32.5  RDW 15.8* 15.7* 15.8*  PLT 232 212 224   Thyroid  No results for input(s): "TSH", "FREET4" in the last 168 hours.   BNPNo results for input(s): "BNP", "PROBNP" in the last 168 hours.  DDimer No results for input(s): "DDIMER" in the last 168 hours.   Radiology    DG CHEST PORT 1 VIEW  Result Date: 12/06/2022 CLINICAL DATA:  Shortness of breath. EXAM: PORTABLE CHEST 1 VIEW COMPARISON:  Chest x-ray dated November 29, 2022. FINDINGS: The heart size and mediastinal contours are within normal limits. Normal pulmonary vascularity. Mild irregular opacities at the right lung base are unchanged and may reflect scarring after recent pneumonia. No focal consolidation, pleural effusion, or pneumothorax. No acute osseous abnormality. IMPRESSION: 1. No active disease. Electronically Signed   By: Titus Dubin [name]   On: 12/06/2022 11:11    Cardiac Studies  TEE 11/26/22: IMPRESSIONS     1. Left ventricular ejection fraction, by estimation, is 20%. The left   ventricle has severely decreased function.   2. Right ventricular systolic function is severely reduced. The right  ventricular size is mildly enlarged.   3. Left atrial size was moderately dilated. No left atrial/left atrial  appendage thrombus was detected. The LAA emptying velocity was 35 cm/s.   4. A small pericardial effusion is present.   5. The mitral valve is grossly normal. Severe mitral valve regurgitation,  likely secondary to LV dysfunction and atrial functional MR. Multiple  jets, appears commissural.   6. The aortic valve is grossly normal. Aortic valve regurgitation is  mild.   7. There is mild (Grade II) atheroma plaque involving the aortic arch and  descending aorta.   Conclusion(s)/Recommendation(s): No LA/LAA thrombus identified. Successful  cardioversion performed with restoration of normal sinus rhythm.    Patient Profile     81 y.o. 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 Patient was recently admitted from 10/29/2022 to 11/08/2022 for new onset atrial fibrillation and was found to have a reduced EF of 40-45%. Hospitalization was complicated by community acquired pneumonia (which was initially diagnosed prior to this admission) with right pleural effusion s/p thoracentesis on 10/30/2022, and acute stroke requiring thrombectomy. She was discharged to CIR on 11/08/2022. She went back into rapid atrial fibrillation with rates in the 150s on 11/23/2022 and was hypotensive with it. Therefore, she was readmitted as an inpatient and Cardiology consulted for assistance. She was TEE/DCCV on 12/18 but had recurrence of AF RVR/ aflutter, and was repeat cardioverted 12/27 after amiodarone loading. Plan is to return to CIR.  Assessment & Plan    #Persistent Atrial Fibrillation, now in SR  -  S/p successful TEE/DCCV on 11/26/22 but developed recurrent RVR on 12/20, after amio load, repeat successful DCCV 12/05/22.  - amio 200 mg daily ongoing, indefinite to be determined as outpatient for duration. - Cannot tolerate nodal agents due to significant orthostatic hypotension, and now with noctural sinus bradycardia not required. BP has been stable with midodrine supplementation. - Continue Eliquis '5mg'$  BID  #Episode of decreased responsiveness: #Recent MCA CVA Patient had episode of decreased responsiveness with nonsensical speech for a brief period. Underwent CT head which showed new hypodensity in the right insular region. Follow-up brain MRI fortunately  with no acute finding. Had progression of right MCA territory infract but was subacute to chronic. MRA with no recurrent occlusion or proximal flow limiting stenosis. -Management per neuro and primary  #Chronic Systolic CHF:  Echo during recent admission showed LVEF of 40-45% with global hypokinesis , moderate asymmetric LVH of the basal-septal segment and moderate to severe MR. TEE on 11/26/22 with interval drop in EF to 20% in the setting of Afib with RVR. Currently euvolemic on exam. On amiodarone for recurrent RVR. -TEE with interval drop in EF to 20% in the setting of Afib with RVR -Unable to tolerate GDMT due to orthostatic hypotension - repeat TTE shows similarly depressed EF, likely 25-30%. Needs rate and rhythm control as much as possible.  -will likely repeat echo first week of January if she maintains SR, can be done at [facility] or as outpatient.   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 Dilt stopped and midodrine started. Continues to have significant orthostasis limiting medical therapy. -reduce midodrine dose to 5 mg BID, was increased to 10 mg but BP is hypertensive. -Continue compression socks and abdominal binder    #Severe MR: Severe on TEE. Likely functional + atrial functional.  - repeat echo as above.  Cardiology will sign off at this time. No further inpatient cardiovascular recommendations.   For questions or updates, please contact [place] Please consult [email or web address] for contact info under        Signed, Elouise Munroe, MD  12/07/2022, 8:05 AM

## 2022-12-07 NOTE — Plan of Care (Signed)
  Problem: Clinical Measurements: Goal: Will remain free from infection Outcome: Progressing Goal: Cardiovascular complication will be avoided Outcome: Progressing   Problem: Activity: Goal: Risk for activity intolerance will decrease Outcome: Progressing   Problem: Nutrition: Goal: Adequate nutrition will be maintained Outcome: Progressing   

## 2022-12-07 NOTE — Progress Notes (Signed)
PROGRESS NOTE    Leslie [name]  XFG:[id number] DOB: 1941/05/23 DOA: 11/23/2022 PCP: Sonia Side., FNP  No chief complaint on file.   Brief Narrative:  Leslie [name] is an 81 y.o. female with Leslie [name] history of hypothyroidism, GERD who presented to the ED on 10/29/2022 from PCP's office after finding new AFib with RVR. There was noted to have pneumonia with right pleural effusion treated with antibiotics and thoracentesis (transudate, negative Cx and cytology). Cardiology was consulted, eliquis started, and ultimately got rates under adequate control. On 11/25 she developed right gaze deviation, left hemiparesis and neglect due to right M1 occlusion, taken emergently for mechanical thrombectomy which restored TICI 3 flow. She stabilized, ultimately transferring to CIR 11/30. While undergoing therapy, diltiazem dose was decreased due to orthostatic hypotension. She went recurrently into rapid atrial fibrillation with hypotension prompting readmission on 12/15. Subsequently underwent TEE/DCCV 12/18. Plan was to transfer back to CIR, though she's recurrently in rapid atrial fibrillation again 12/20. Amiodarone gtt initiated, ultimately tapered. the patient's atrial fibrillation/flutter rate is improved but not tolerated well. DCCV is planned 12/27. Updated PT/OT recommendations are needed to return to CIR.   Assessment & Plan:   Principal Problem:   Paroxysmal [name] Active Problems:   Atrial fibrillation with rapid ventricular response ([name])   Hypotension   Heart failure with reduced ejection fraction ([name])   History of CVA with residual deficit   COPD (chronic obstructive pulmonary disease) ([name])   Hypothyroidism   Pressure injury of skin   Cerebrovascular accident (CVA) ([place])  Paroxysmal atrial fibrillation/flutter with RVR: s/p TEE/DCCV 12/18, reverted NSR > AFib 12/20. Does not tolerate atrial fibrillation well. Options limited with hypotension.  - Atrial flutter is  persistent despite ongoing amiodarone and she seems to have persistent symptoms attributable to this.  - s/p DCCV 12/27 - Cardiology assistance appreciated. Continue amiodarone with oral loading, '400mg'$  BID, will start maintenance dose '200mg'$  daily 12/27. prn digoxin, not currently required.  - Goal potassium at least 4 and magnesium at least 2. Recheck labs in AM. - Continue Eliquis - plan for repeat echo in January if she maintains sinus rhythm, can be done in inpatient rehab or as outpatient    Delirium  Concern for Cognitive Deficit: Improving. Ammonia noted to be 36 (ULN is 35). No asterixis on exam, no other hepatic dysfunction or lethargy.  - Continue delirium precautions. Mobilize as much as possible, may go into hallway in [facility].  - Tx possible UTI as below.  - MMSE 19/30 12/28 with SLP - neurology f/u outpatient    Tremor - seems new, more left sided - seems improved now - ? 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  - Continue anticoagulation, statin.  - Continue PT/OT/SLP. Updated rec's needed to restart insurance authorization process.   - New stroke ruled out: Initial concern after brief unresponsive episode with right insular hypodensity on CT 12/21, no acute findings on subsequent MRI/MRA. No seizure seen on EEG. Mentation and neurological status is back to her recent baseline. No syncope.  - Avoid hypotension   Dysphagia - will ask SLP to reeval, concerns regarding swallowing per patient -> no witnessed concerns 12/28 per discussion with SLP  Enterococcus UTI:  - amoxicillin per discussion with family and culture results (enterococcus) - s/p 7 days   Orthostatic  Hypotension  Hypotension: - midodrine decreased to 5 in setting of HTN per cards - no official orthostatics today yet, but  got up to chair without orthostasis today - Continue TED hose/abdominal binder.  - follow orthostatics daily    HFrEF, tachycardia-mediated cardiomyopathy, severe MR: LVEF 40-45% with global hypokinesis. TEE in setting of AFib with RVR noted interval worsening LVEF to 20%.  - Monitor I/O, weights, BMP.  - Consider ischemic evaluation eventually.     Vitamin B12 deficiency: B12 low normal. MMA elevated. - Continue B12 supplementation   COPD: Patient without acute exacerbation.   Hypothyroidism: TSH 11.301.   - Continue levothyroxine (increased dose)    DVT prophylaxis: eliquis Code Status: full Family Communication: daughter Disposition:   Status is: Inpatient Remains inpatient appropriate because: working on placement   Consultants:  cardiology  Procedures:  12/27 Plan: Successful DCCV with [name] single 150J shock to NSR.  12/20 Echo IMPRESSIONS     1. Limited study with only 45 images, technically difficult   2. Left ventricular ejection fraction, by estimation, is 40 to 45%. The  left ventricle has mildly decreased function. The left ventricle  demonstrates global hypokinesis. There is mild asymmetric left ventricular  hypertrophy. Left ventricular diastolic  function could not be evaluated.   3. The mitral valve is abnormal. Mild to moderate mitral valve  regurgitation.   4. Rhythm strip during this exam demonstrates atrial fib with rapid  ventricular response.   5. The aortic valve is tricuspid. Aortic valve regurgitation is not  visualized.   6. Right ventricular systolic function was not well visualized. The right  ventricular size is not well visualized.   Comparison(s): Changes from prior study are noted. 10/30/2022: LVEF  40-45%, modearate to severe MR.   Conclusion(s)/Recommendation(s): Consider repeat limited echo when the HR  is lower.   12/18 IMPRESSIONS     1. Left ventricular ejection fraction, by estimation, is 20%. The left  ventricle has  severely decreased function.   2. Right ventricular systolic function is severely reduced. The right  ventricular size is mildly enlarged.   3. Left atrial size was moderately dilated. No left atrial/left atrial  appendage thrombus was detected. The LAA emptying velocity was 35 cm/s.   4. Tilly Pernice small pericardial effusion is present.   5. The mitral valve is grossly normal. Severe mitral valve regurgitation,  likely secondary to LV dysfunction and atrial functional MR. Multiple  jets, appears commissural.   6. The aortic valve is grossly normal. Aortic valve regurgitation is  mild.   7. There is mild (Grade II) atheroma plaque involving the aortic arch and  descending aorta.   Conclusion(s)/Recommendation(s): No LA/LAA thrombus identified. Successful  cardioversion performed with restoration of normal sinus rhythm.   11/21 IMPRESSIONS     1. Left ventricular ejection fraction, by estimation, is 40 to 45%. The  left ventricle has mildly decreased function. The left ventricle  demonstrates global hypokinesis. There is moderate asymmetric left  ventricular hypertrophy of the basal-septal  segment. Left ventricular diastolic parameters are indeterminate.   2. Right ventricular systolic function is normal. The right ventricular  size is normal. There is normal pulmonary artery systolic pressure.   3. Left atrial size was mildly dilated.   4. The mitral valve is abnormal. Moderate to severe mitral valve  regurgitation. No evidence of mitral stenosis.   5. The aortic valve was not well visualized. Aortic valve regurgitation  is trivial. No aortic stenosis is present.   6. The  inferior vena cava is dilated in size with >50% respiratory  variability, suggesting right atrial pressure of 8 mmHg.   Comparison(s): MR is worse from prior report.  Antimicrobials:  Anti-infectives (From admission, onward)    Start     Dose/Rate Route Frequency Ordered Stop   12/01/22 1800  amoxicillin (AMOXIL)  capsule 500 mg        500 mg Oral Every 8 hours 12/01/22 1152     12/01/22 0730  amoxicillin (AMOXIL) capsule 500 mg  Status:  Discontinued        500 mg Oral Every 8 hours 12/01/22 0637 12/01/22 1152       Subjective: No new complaints Daughter, granddaughter present, they note she's having better day today, did well transferring to chair without complaint of orthostasis  Objective: Vitals:   12/05/22 0850 12/05/22 0900 12/05/22 1300 12/05/22 1335  BP: 133/81 129/82 132/79 118/82  Pulse: 64 67    Resp: '14 14 13 16  '$ Temp:    98.1 F (36.7 C)  TempSrc:    Oral  SpO2: 100% 100%    Weight:      Height:        Intake/Output Summary (Last 24 hours) at 12/05/2022 1605 Last data filed at 12/05/2022 0836 Gross per 24 hour  Intake 460 ml  Output --  Net 460 ml   Filed Weights   12/03/22 0506 12/04/22 0450 12/05/22 0619  Weight: 67 kg 66.1 kg 66.8 kg    Examination:  General: No acute distress. Sitting up in chair. Cardiovascular: Heart sounds show Quaneshia Wareing regular rate, and rhythm.  Lungs: Clear to auscultation bilaterally  Abdomen: Soft, nontender, nondistended  Neurological: Alert. Moves all extremities 4 . Cranial nerves II through XII grossly intact.  Tremor seems improved. Extremities: No clubbing or cyanosis. No edema.  Data Reviewed: I have personally reviewed following labs and imaging studies  CBC: Recent Labs  Lab 11/29/22 0241 11/30/22 0700 12/01/22 0138 12/05/22 0358  WBC 10.8* 9.7  --  10.1  HGB 13.0 11.5* 13.2 12.4  HCT 40.9 38.1 42.2 39.2  MCV 87.2 92.5  --  87.9  PLT 204 155  --  366    Basic Metabolic Panel: Recent Labs  Lab 11/29/22 0241 11/30/22 0700 11/30/22 0940 12/01/22 0138 12/05/22 0358  NA 139 131* 138 140 141  K 3.4* 3.3* 3.7 4.5 4.0  CL 108 86* 105 108 105  CO2 23 20* '24 25 26  '$ GLUCOSE 118* 719* 113* 96 95  BUN '9 9 10 11 12  '$ CREATININE 1.15* 1.14* 1.14* 1.13* 1.27*  CALCIUM 9.6 8.2* 9.7 9.6 9.7  MG 2.0  --   --   --  2.1     GFR: Estimated Creatinine Clearance: 35 mL/min (Rashema Seawright) (by C-G formula based on SCr of 1.27 mg/dL (H)).  Liver Function Tests: No results for input(s): "AST", "ALT", "ALKPHOS", "BILITOT", "PROT", "ALBUMIN" in the last 168 hours.  CBG: Recent Labs  Lab 11/28/22 2313 11/30/22 0814  GLUCAP 143* 108*     Recent Results (from the past 240 hour(s))  Urine Culture     Status: Abnormal   Collection Time: 11/28/22 12:24 AM   Specimen: Urine, Clean Catch  Result Value Ref Range Status   Specimen Description URINE, CLEAN CATCH  Final   Special Requests   Final    NONE Performed at [facility], [place] [address] [place], [place] [address]    Culture 60,000 COLONIES/mL ENTEROCOCCUS FAECALIS (Silvana Holecek)  Final  Report Status 11/30/2022 FINAL  Final   Organism ID, Bacteria ENTEROCOCCUS FAECALIS (Asal Teas)  Final      Susceptibility   Enterococcus faecalis - MIC*    AMPICILLIN <=2 SENSITIVE Sensitive     NITROFURANTOIN <=16 SENSITIVE Sensitive     VANCOMYCIN 1 SENSITIVE Sensitive     * 60,000 COLONIES/mL ENTEROCOCCUS FAECALIS         Radiology Studies: No results found.      Scheduled Meds:  amiodarone  200 mg Oral Daily   amoxicillin  500 mg Oral Q8H   apixaban  5 mg Oral BID   vitamin B-12  1,000 mcg Oral Daily   famotidine  20 mg Oral Daily   feeding supplement  237 mL Oral TID BM   Gerhardt's butt cream   Topical BID   levothyroxine  100 mcg Oral Q0600   midodrine  5 mg Oral TID WC   mouth rinse  15 mL Mouth Rinse 4 times per day   rosuvastatin  10 mg Oral Daily   senna-docusate  2 tablet Oral BID   Continuous Infusions:   LOS: 12 days    Time spent: over 30 min    Fayrene Helper, MD [facility]   To contact the attending provider between 7A-7P or the covering provider during after hours 7P-7A, please log into the web site [email or web address] and access using universal [place] password for that web site. If you do not have the password, please call the  hospital operator.  12/05/2022, 4:05 PM

## 2022-12-08 ENCOUNTER — Encounter (HOSPITAL_COMMUNITY): Payer: Self-pay | Admitting: Internal Medicine

## 2022-12-08 LAB — CBC WITH DIFFERENTIAL/PLATELET
Abs Immature Granulocytes: 0.03 10*3/uL (ref 0.00–0.07)
Basophils Absolute: 0.1 10*3/uL (ref 0.0–0.1)
Basophils Relative: 1 %
Eosinophils Absolute: 0.2 10*3/uL (ref 0.0–0.5)
Eosinophils Relative: 2 %
HCT: 35.5 % — ABNORMAL LOW (ref 36.0–46.0)
Hemoglobin: 11.5 g/dL — ABNORMAL LOW (ref 12.0–15.0)
Immature Granulocytes: 0 %
Lymphocytes Relative: 30 %
Lymphs Abs: 3 10*3/uL (ref 0.7–4.0)
MCH: 28.3 pg (ref 26.0–34.0)
MCHC: 32.4 g/dL (ref 30.0–36.0)
MCV: 87.2 fL (ref 80.0–100.0)
Monocytes Absolute: 0.9 10*3/uL (ref 0.1–1.0)
Monocytes Relative: 9 %
Neutro Abs: 5.8 10*3/uL (ref 1.7–7.7)
Neutrophils Relative %: 58 %
Platelets: 231 10*3/uL (ref 150–400)
RBC: 4.07 MIL/uL (ref 3.87–5.11)
RDW: 15.8 % — ABNORMAL HIGH (ref 11.5–15.5)
WBC: 10 10*3/uL (ref 4.0–10.5)
nRBC: 0 % (ref 0.0–0.2)

## 2022-12-08 LAB — COMPREHENSIVE METABOLIC PANEL
ALT: 22 U/L (ref 0–44)
AST: 30 U/L (ref 15–41)
Albumin: 3.4 g/dL — ABNORMAL LOW (ref 3.5–5.0)
Alkaline Phosphatase: 54 U/L (ref 38–126)
Anion gap: 8 (ref 5–15)
BUN: 14 mg/dL (ref 8–23)
CO2: 28 mmol/L (ref 22–32)
Calcium: 9.6 mg/dL (ref 8.9–10.3)
Chloride: 104 mmol/L (ref 98–111)
Creatinine, Ser: 1.27 mg/dL — ABNORMAL HIGH (ref 0.44–1.00)
GFR, Estimated: 43 mL/min — ABNORMAL LOW (ref >60)
Glucose, Bld: 108 mg/dL — ABNORMAL HIGH (ref 70–99)
Potassium: 3.9 mmol/L (ref 3.5–5.1)
Sodium: 140 mmol/L (ref 135–145)
Total Bilirubin: 1 mg/dL (ref 0.3–1.2)
Total Protein: 5.9 g/dL — ABNORMAL LOW (ref 6.5–8.1)

## 2022-12-08 LAB — MAGNESIUM: Magnesium: 2.2 mg/dL (ref 1.7–2.4)

## 2022-12-08 MED ORDER — MIDODRINE HCL 5 MG PO TABS
5.0000 mg | ORAL_TABLET | Freq: Two times a day (BID) | ORAL | Status: DC
  Administered 2022-12-08 – 2022-12-10 (×4): 5 mg via ORAL
  Filled 2022-12-08 (×4): qty 1

## 2022-12-08 NOTE — Progress Notes (Signed)
Physical Therapy Treatment Patient Details Name: Leslie [name] MRN: [id number] DOB: 1941/10/23 Today's Date: 12/08/2022   History of Present Illness Leslie [name] is a 81 y.o. female who was transferred back to inpatient side (from [facility] being found to be in atrial flutter with heart rates into the 160s and low blood pressures. S/p TEE/cardioversion on 12/18. 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 Thigh high TED hose not found so used ace wraps for LE's. From supine (114/71) to sitting (118/73) no drop. Unable to get initial standing BP due to cuff not functioning on portable monitor when portable monitor not connected to main unit. Pt denied symptoms and was able to amb in hallway and back. Once returned to sitting EOB BP 103/69 and pt not symptomatic. Then took one static standing BP (88/47) with pt reporting some mild nausea and seemed weak. It seems symptomatically that pt tolerates amb better than static standing so will continue to mobilize and work toward incr activity tolerance and mobility. Continue to recommend acute inpatient rehab (AIR) for post-acute therapy needs.    Recommendations for follow up therapy are one component of a multi-disciplinary discharge planning process, led by the attending physician.  Recommendations may be updated based on patient status, additional functional criteria and insurance authorization.  Follow Up Recommendations  Acute inpatient rehab (3hours/day)     Assistance Recommended at Discharge Frequent or constant Supervision/Assistance  Patient can return home with the following A little help with walking and/or transfers;A little help with bathing/dressing/bathroom;Assistance with cooking/housework;Assist for transportation;Direct supervision/assist for medications management;Help  with stairs or ramp for entrance   Equipment Recommendations  None recommended by PT    Recommendations for Other Services       Precautions / Restrictions Precautions Precautions: Fall;Other (comment) Precaution Comments: orthostatic, teds, ace wraps and ABD binder Restrictions Weight Bearing Restrictions: No     Mobility  Bed Mobility Overal bed mobility: Needs Assistance Bed Mobility: Supine to Sit, Sit to Supine     Supine to sit: Min assist Sit to supine: Min assist   General bed mobility comments: Assist to elevate trunk into sitting and to bring legs back up into bed returning to supine.    Transfers Overall transfer level: Needs assistance Equipment used: Rolling walker (2 wheels) Transfers: Sit to/from Stand Sit to Stand: Min assist           General transfer comment: Assist to bring hips up and for balance    Ambulation/Gait Ambulation/Gait assistance: Min assist Gait Distance (Feet): 140 Feet Assistive device: Rolling walker (2 wheels) Gait Pattern/deviations: Step-through pattern Gait velocity: decreased Gait velocity interpretation: 1.31 - 2.62 ft/sec, indicative of limited community ambulator   General Gait Details: Assist for balance and support. Pt denied any dizziness or light headedness   Stairs             Wheelchair Mobility    Modified Rankin (Stroke Patients Only)       Balance Overall balance assessment: Needs assistance Sitting-balance support: Feet supported Sitting balance-Leahy Scale: Fair     Standing balance support: Bilateral upper extremity supported Standing balance-Leahy Scale: Poor Standing balance comment: walker and min assist for static standing                            Cognition Arousal/Alertness:  Awake/alert Behavior During Therapy: WFL for tasks assessed/performed Overall Cognitive Status: Impaired/Different from baseline Area of Impairment: Attention, Memory, Following commands,  Safety/judgement, Awareness, Problem solving                   Current Attention Level: Selective Memory: Decreased recall of precautions, Decreased short-term memory Following Commands: Follows one step commands consistently Safety/Judgement: Decreased awareness of deficits, Decreased awareness of safety   Problem Solving: Slow processing, Requires verbal cues, Requires tactile cues          Exercises      General Comments General comments (skin integrity, edema, etc.): See assessment for BP's      Pertinent Vitals/Pain Pain Assessment Pain Assessment: No/denies pain    Home Living                          Prior Function            PT Goals (current goals can now be found in the care plan section) Acute Rehab PT Goals Patient Stated Goal: to walk Progress towards PT goals: Progressing toward goals    Frequency    Min 3X/week      PT Plan Current plan remains appropriate    Co-evaluation              AM-PAC PT "6 Clicks" Mobility   Outcome Measure  Help needed turning from your back to your side while in a flat bed without using bedrails?: A Little Help needed moving from lying on your back to sitting on the side of a flat bed without using bedrails?: A Little Help needed moving to and from a bed to a chair (including a wheelchair)?: A Little Help needed standing up from a chair using your arms (e.g., wheelchair or bedside chair)?: A Lot Help needed to walk in hospital room?: A Little Help needed climbing 3-5 steps with a railing? : A Lot 6 Click Score: 16    End of Session Equipment Utilized During Treatment: Gait belt;Other (comment) (ace wraps for LE's) Activity Tolerance: Patient tolerated treatment well Patient left: in bed;with call bell/phone within reach;with bed alarm set;with family/visitor present Nurse Communication: Mobility status PT Visit Diagnosis: Other abnormalities of gait and mobility (R26.89);Unsteadiness on  feet (R26.81);Muscle weakness (generalized) (M62.81)     Time: 1898-4210 PT Time Calculation (min) (ACUTE ONLY): 27 min  Charges:  $Gait Training: 23-37 mins                     [facility] [place] 12/08/2022, 4:37 PM

## 2022-12-08 NOTE — Progress Notes (Signed)
PROGRESS NOTE    Leslie [name]  ZOX:[id number] DOB: [birth year] DOA: 11/23/2022 PCP: Sonia Side., FNP  No chief complaint on file.   Brief Narrative:  Leslie [name] is an 80 y.o. female with Makailah Slavick history of hypothyroidism, GERD who presented to the ED on 10/29/2022 from PCP's office after finding new AFib with RVR. There was noted to have pneumonia with right pleural effusion treated with antibiotics and thoracentesis (transudate, negative Cx and cytology). Cardiology was consulted, eliquis started, and ultimately got rates under adequate control. On 11/25 she developed right gaze deviation, left hemiparesis and neglect due to right M1 occlusion, taken emergently for mechanical thrombectomy which restored TICI 3 flow. She stabilized, ultimately transferring to CIR 11/30. While undergoing therapy, diltiazem dose was decreased due to orthostatic hypotension. She went recurrently into rapid atrial fibrillation with hypotension prompting readmission on 12/15. Subsequently underwent TEE/DCCV 12/18. Plan was to transfer back to CIR, though she's recurrently in rapid atrial fibrillation again 12/20. Amiodarone gtt initiated, ultimately tapered. the patient's atrial fibrillation/flutter rate is improved but not tolerated well. DCCV is planned 12/27. Updated PT/OT recommendations are needed to return to CIR.   Assessment & Plan:   Principal Problem:   Paroxysmal Leslie [name] ([place]) Active Problems:   Atrial fibrillation with rapid ventricular response ([name])   Hypotension   Heart failure with reduced ejection fraction ([name])   History of CVA with residual deficit   COPD (chronic obstructive pulmonary disease) ([name])   Hypothyroidism   Pressure injury of skin   Cerebrovascular accident (CVA) ([place])  Paroxysmal atrial fibrillation/flutter with RVR: s/p TEE/DCCV 12/18, reverted NSR > AFib 12/20. Does not tolerate atrial fibrillation well. Options limited with hypotension.  - Atrial flutter is  persistent despite ongoing amiodarone and she seems to have persistent symptoms attributable to this.  - s/p DCCV 12/27 - Cardiology assistance appreciated. Continue amiodarone with oral loading, '400mg'$  BID, maintenance dose started on 12/27 '200mg'$  daily. prn digoxin, not currently required.  - Goal potassium at least 4 and magnesium at least 2. Recheck labs in AM. - Continue Eliquis - plan for repeat echo in January if she maintains sinus rhythm, can be done in inpatient rehab or as outpatient    Delirium  Concern for Cognitive Deficit: Improving. Ammonia noted to be 36 (ULN is 35). No asterixis on exam, no other hepatic dysfunction or lethargy.  - Continue delirium precautions. Mobilize as much as possible, may go into hallway in [facility].  - Tx possible UTI as below.  - MMSE 19/30 12/28 with SLP - neurology f/u outpatient    Tremor - seems new, more left sided - seems improved now - ? 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  - Continue anticoagulation, statin.  - Continue PT/OT/SLP. Updated rec's needed to restart insurance authorization process.   - New stroke ruled out: Initial concern after brief unresponsive episode with right insular hypodensity on CT 12/21, no acute findings on subsequent MRI/MRA. No seizure seen on EEG. Mentation and neurological status is back to her recent baseline. No syncope.  - Avoid hypotension   Dysphagia - will ask SLP to reeval, concerns regarding swallowing per patient -> no witnessed concerns 12/28 per discussion with SLP  Enterococcus UTI:  - amoxicillin per discussion with family and culture results (enterococcus) - s/p 7 days   Orthostatic  Hypotension  Hypotension: - midodrine decreased to 5 in setting of HTN per cards - Continue TED hose/abdominal binder.  -  follow orthostatics daily -> see 12/30 therapy note, had some orthostasis with "static standing" "symptomatically... pt tolerates amb better than static standing so will continue to mobilize and work towards incr activity tolerance and mobility"   HFrEF, tachycardia-mediated cardiomyopathy, severe MR: LVEF 40-45% with global hypokinesis. TEE in setting of AFib with RVR noted interval worsening LVEF to 20%.  - Monitor I/O, weights, BMP.  - Consider ischemic evaluation eventually.     Vitamin B12 deficiency: B12 low normal. MMA elevated. - Continue B12 supplementation   COPD: Patient without acute exacerbation.   Hypothyroidism: TSH 11.301.   - Continue levothyroxine (increased dose) - repeat labs in [place] few weeks    DVT prophylaxis: eliquis Code Status: full Family Communication: daughter Disposition:   Status is: Inpatient Remains inpatient appropriate because: working on placement   Consultants:  cardiology  Procedures:  12/27 Plan: Successful DCCV with Finnegan Gatta single 150J shock to NSR.  12/20 Echo IMPRESSIONS     1. Limited study with only 45 images, technically difficult   2. Left ventricular ejection fraction, by estimation, is 40 to 45%. The  left ventricle has mildly decreased function. The left ventricle  demonstrates global hypokinesis. There is mild asymmetric left ventricular  hypertrophy. Left ventricular diastolic  function could not be evaluated.   3. The mitral valve is abnormal. Mild to moderate mitral valve  regurgitation.   4. Rhythm strip during this exam demonstrates atrial fib with rapid  ventricular response.   5. The aortic valve is tricuspid. Aortic valve regurgitation is not  visualized.   6. Right ventricular systolic function was not well visualized. The right  ventricular size is not well visualized.   Comparison(s): Changes from prior study are noted. 10/30/2022: LVEF  40-45%, modearate to severe MR.   Conclusion(s)/Recommendation(s):  Consider repeat limited echo when the HR  is lower.   12/18 IMPRESSIONS     1. Left ventricular ejection fraction, by estimation, is 20%. The left  ventricle has severely decreased function.   2. Right ventricular systolic function is severely reduced. The right  ventricular size is mildly enlarged.   3. Left atrial size was moderately dilated. No left atrial/left atrial  appendage thrombus was detected. The LAA emptying velocity was 35 cm/s.   4. Ala Capri small pericardial effusion is present.   5. The mitral valve is grossly normal. Severe mitral valve regurgitation,  likely secondary to LV dysfunction and atrial functional MR. Multiple  jets, appears commissural.   6. The aortic valve is grossly normal. Aortic valve regurgitation is  mild.   7. There is mild (Grade II) atheroma plaque involving the aortic arch and  descending aorta.   Conclusion(s)/Recommendation(s): No LA/LAA thrombus identified. Successful  cardioversion performed with restoration of normal sinus rhythm.   11/21 IMPRESSIONS     1. Left ventricular ejection fraction, by estimation, is 40 to 45%. The  left ventricle has mildly decreased function. The left ventricle  demonstrates global hypokinesis. There is moderate asymmetric left  ventricular hypertrophy of the basal-septal  segment. Left ventricular diastolic parameters are indeterminate.   2. Right ventricular systolic function is normal. The right ventricular  size is normal. There is normal pulmonary artery systolic pressure.   3. Left atrial size was mildly dilated.   4. The mitral valve is abnormal. Moderate to severe mitral valve  regurgitation. No evidence of mitral stenosis.  5. The aortic valve was not well visualized. Aortic valve regurgitation  is trivial. No aortic stenosis is present.   6. The inferior vena cava is dilated in size with >50% respiratory  variability, suggesting right atrial pressure of 8 mmHg.   Comparison(s): MR is worse from  prior report.  Antimicrobials:  Anti-infectives (From admission, onward)    Start     Dose/Rate Route Frequency Ordered Stop   12/01/22 1800  amoxicillin (AMOXIL) capsule 500 mg        500 mg Oral Every 8 hours 12/01/22 1152     12/01/22 0730  amoxicillin (AMOXIL) capsule 500 mg  Status:  Discontinued        500 mg Oral Every 8 hours 12/01/22 0637 12/01/22 1152       Subjective: Had confusion overnight Daughter and SIL at bedside  Objective: Vitals:   12/05/22 0850 12/05/22 0900 12/05/22 1300 12/05/22 1335  BP: 133/81 129/82 132/79 118/82  Pulse: 64 67    Resp: '14 14 13 16  '$ Temp:    98.1 F (36.7 C)  TempSrc:    Oral  SpO2: 100% 100%    Weight:      Height:        Intake/Output Summary (Last 24 hours) at 12/05/2022 1605 Last data filed at 12/05/2022 0836 Gross per 24 hour  Intake 460 ml  Output --  Net 460 ml   Filed Weights   12/03/22 0506 12/04/22 0450 12/05/22 0619  Weight: 67 kg 66.1 kg 66.8 kg    Examination:  General: No acute distress. Cardiovascular: RRR Lungs: unlabored Abdomen: Soft, nontender, nondistended  Neurological: persistent mild delirium, left sided facial droop Extremities: No clubbing or cyanosis. No edema.   Data Reviewed: I have personally reviewed following labs and imaging studies  CBC: Recent Labs  Lab 11/29/22 0241 11/30/22 0700 12/01/22 0138 12/05/22 0358  WBC 10.8* 9.7  --  10.1  HGB 13.0 11.5* 13.2 12.4  HCT 40.9 38.1 42.2 39.2  MCV 87.2 92.5  --  87.9  PLT 204 155  --  268    Basic Metabolic Panel: Recent Labs  Lab 11/29/22 0241 11/30/22 0700 11/30/22 0940 12/01/22 0138 12/05/22 0358  NA 139 131* 138 140 141  K 3.4* 3.3* 3.7 4.5 4.0  CL 108 86* 105 108 105  CO2 23 20* '24 25 26  '$ GLUCOSE 118* 719* 113* 96 95  BUN '9 9 10 11 12  '$ CREATININE 1.15* 1.14* 1.14* 1.13* 1.27*  CALCIUM 9.6 8.2* 9.7 9.6 9.7  MG 2.0  --   --   --  2.1    GFR: Estimated Creatinine Clearance: 35 mL/min (Aveena Bari) (by C-G formula based on  SCr of 1.27 mg/dL (H)).  Liver Function Tests: No results for input(s): "AST", "ALT", "ALKPHOS", "BILITOT", "PROT", "ALBUMIN" in the last 168 hours.  CBG: Recent Labs  Lab 11/28/22 2313 11/30/22 0814  GLUCAP 143* 108*     Recent Results (from the past 240 hour(s))  Urine Culture     Status: Abnormal   Collection Time: 11/28/22 12:24 AM   Specimen: Urine, Clean Catch  Result Value Ref Range Status   Specimen Description URINE, CLEAN CATCH  Final   Special Requests   Final    NONE Performed at [facility], [place] [address], [place], [place] [address]    Culture 60,000 COLONIES/mL ENTEROCOCCUS FAECALIS (Ilario Dhaliwal)  Final   Report Status 11/30/2022 FINAL  Final   Organism ID, Bacteria ENTEROCOCCUS FAECALIS (Electra Paladino)  Final      Susceptibility   Enterococcus faecalis - MIC*    AMPICILLIN <=2 SENSITIVE Sensitive     NITROFURANTOIN <=16 SENSITIVE Sensitive     VANCOMYCIN 1 SENSITIVE Sensitive     * 60,000 COLONIES/mL ENTEROCOCCUS FAECALIS         Radiology Studies: No results found.      Scheduled Meds:  amiodarone  200 mg Oral Daily   amoxicillin  500 mg Oral Q8H   apixaban  5 mg Oral BID   vitamin B-12  1,000 mcg Oral Daily   famotidine  20 mg Oral Daily   feeding supplement  237 mL Oral TID BM   Gerhardt's butt cream   Topical BID   levothyroxine  100 mcg Oral Q0600   midodrine  5 mg Oral TID WC   mouth rinse  15 mL Mouth Rinse 4 times per day   rosuvastatin  10 mg Oral Daily   senna-docusate  2 tablet Oral BID   Continuous Infusions:   LOS: 12 days    Time spent: over 30 min    Fayrene Helper, MD [facility]   To contact the attending provider between 7A-7P or the covering provider during after hours 7P-7A, please log into the web site [email or web address] and access using universal [place] password for that web site. If you do not have the password, please call the hospital operator.  12/05/2022, 4:05 PM

## 2022-12-08 NOTE — Progress Notes (Signed)
[facility] confirmed they have a visual through the telesitter camera for this pt.

## 2022-12-08 NOTE — Progress Notes (Signed)
Palliative-   Chart reviewed. Patient continues to remain stable.  TOC is working with CIR on disposition.  PMT will continue to chart check and see if needed.   Leslie [name], AGNP-C Palliative Medicine  No charge

## 2022-12-09 MED ORDER — ALUM & MAG HYDROXIDE-SIMETH 200-200-20 MG/5ML PO SUSP
30.0000 mL | Freq: Once | ORAL | Status: AC
  Administered 2022-12-09: 30 mL via ORAL
  Filled 2022-12-09: qty 30

## 2022-12-09 NOTE — Progress Notes (Signed)
Inpatient Rehab Admissions Coordinator:  Expedited appeal overturned denial. Received approval. Await bed availability and medical clearance. Will continue to follow.   Gayland Curry, [place], [place] Admissions Coordinator [phone]

## 2022-12-09 NOTE — Progress Notes (Signed)
Mobility Specialist Progress Note:   12/09/22 0931  Mobility  Activity Ambulated with assistance in room;Transferred from bed to chair  Level of Assistance Contact guard assist, steadying assist  Assistive Device Front wheel walker  Distance Ambulated (ft) 2 ft  Activity Response Tolerated well  $Mobility charge 1 Mobility   Pt in bed willing to participate in mobility. No complaints of lightheadedness/ dizziness. BP 114/65, SpO2 100%, HR 74. Left in chair with call bell in reach and all needs met.   Gareth Eagle Hassani Sliney Mobility Specialist Please contact via [employer] or  Rehab Office at [phone]

## 2022-12-09 NOTE — Progress Notes (Addendum)
PROGRESS NOTE    Leslie [name]  OAC:[id number] DOB: Nov 09, 1941 DOA: 11/23/2022 PCP: Sonia Side., FNP  No chief complaint on file.   Brief Narrative:  Leslie [name] is an 81 y.o. female with Leslie [name] history of hypothyroidism, GERD who presented to the ED on 10/29/2022 from PCP's office after finding new AFib with RVR. There was noted to have pneumonia with right pleural effusion treated with antibiotics and thoracentesis (transudate, negative Cx and cytology). Cardiology was consulted, eliquis started, and ultimately got rates under adequate control. On 11/25 she developed right gaze deviation, left hemiparesis and neglect due to right M1 occlusion, taken emergently for mechanical thrombectomy which restored TICI 3 flow. She stabilized, ultimately transferring to CIR 11/30. While undergoing therapy, diltiazem dose was decreased due to orthostatic hypotension. She went recurrently into rapid atrial fibrillation with hypotension prompting readmission on 12/15. Subsequently underwent TEE/DCCV 12/18. Plan was to transfer back to CIR, though she's recurrently in rapid atrial fibrillation again 12/20. Amiodarone gtt initiated, ultimately tapered. the patient's atrial fibrillation/flutter rate is improved but not tolerated well. DCCV is planned 12/27. Updated PT/OT recommendations are needed to return to CIR.   She's been approved by CIR on appeal.  Assessment & Plan:   Principal Problem:   Paroxysmal Dian Minahan-fib ([place]) Active Problems:   Atrial fibrillation with rapid ventricular response ([name])   Hypotension   Heart failure with reduced ejection fraction ([name])   History of CVA with residual deficit   COPD (chronic obstructive pulmonary disease) ([name])   Hypothyroidism   Pressure injury of skin   Cerebrovascular accident (CVA) ([place])  Chest Discomfort EKG with sinus rhythm Suspect this was GI etiology, occurred after she ate Given GI cocktail Low suspicion for ACS, no need for troponins.   If recurrent or more concerning sx, will order.  Paroxysmal atrial fibrillation/flutter with RVR: s/p TEE/DCCV 12/18, reverted NSR > AFib 12/20. Does not tolerate atrial fibrillation well. Options limited with hypotension.  - Atrial flutter is persistent despite ongoing amiodarone and she seems to have persistent symptoms attributable to this.  - s/p DCCV 12/27 - Cardiology assistance appreciated. Continue amiodarone with oral loading, '400mg'$  BID, maintenance dose started on 12/27 '200mg'$  daily. prn digoxin, not currently required.  - Goal potassium at least 4 and magnesium at least 2. Recheck labs in AM. - Continue Eliquis - plan for repeat echo in January if she maintains sinus rhythm, can be done in inpatient rehab or as outpatient    Delirium  Concern for Cognitive Deficit: Improving. Ammonia noted to be 36 (ULN is 35). No asterixis on exam, no other hepatic dysfunction or lethargy.  - Continue delirium precautions. Mobilize as much as possible, may go into hallway in [facility].  - Tx possible UTI as below.  - MMSE 19/30 12/28 with SLP - neurology f/u outpatient    Tremor - seems new, more left sided - seems improved now - ? 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  - Continue anticoagulation, statin.  - Continue PT/OT/SLP. Updated rec's needed to restart insurance authorization process.   - New stroke ruled out: Initial concern after brief unresponsive episode with right insular hypodensity on CT 12/21, no acute findings on subsequent MRI/MRA. No seizure seen on EEG. Mentation and neurological status is back to her recent baseline.  No syncope.  - Avoid hypotension   Dysphagia - will ask SLP to reeval, concerns regarding swallowing per patient -> no witnessed concerns  12/28 per discussion with SLP  Enterococcus UTI:  - amoxicillin per discussion with family and culture results (enterococcus) - s/p 7 days   Orthostatic Hypotension  Hypotension: - midodrine decreased to 5 in setting of HTN per cards - Continue TED hose/abdominal binder.  - follow orthostatics daily -> see 12/30 therapy note, had some orthostasis with "static standing" "symptomatically... pt tolerates amb better than static standing so will continue to mobilize and work towards incr activity tolerance and mobility".  Worked with mobility tech and did well without [place] 12/31.   HFrEF, tachycardia-mediated cardiomyopathy, severe MR: LVEF 40-45% with global hypokinesis. TEE in setting of AFib with RVR noted interval worsening LVEF to 20%.  - Monitor I/O, weights, BMP.  - Consider ischemic evaluation eventually.     Vitamin B12 deficiency: B12 low normal. MMA elevated. - Continue B12 supplementation   COPD: Patient without acute exacerbation.   Hypothyroidism: TSH 11.301.   - Continue levothyroxine (increased dose) - repeat labs in Leslie [name] few weeks    DVT prophylaxis: eliquis Code Status: full Family Communication: none at bedside Disposition:   Status is: Inpatient Remains inpatient appropriate because: working on placement   Consultants:  cardiology  Procedures:  12/27 Plan: Successful DCCV with Pasqualino Witherspoon single 150J shock to NSR.  12/20 Echo IMPRESSIONS     1. Limited study with only 45 images, technically difficult   2. Left ventricular ejection fraction, by estimation, is 40 to 45%. The  left ventricle has mildly decreased function. The left ventricle  demonstrates global hypokinesis. There is mild asymmetric left ventricular  hypertrophy. Left ventricular diastolic  function could not be evaluated.   3. The mitral valve is abnormal. Mild to moderate mitral valve  regurgitation.   4. Rhythm strip during this exam demonstrates atrial fib with rapid  ventricular response.    5. The aortic valve is tricuspid. Aortic valve regurgitation is not  visualized.   6. Right ventricular systolic function was not well visualized. The right  ventricular size is not well visualized.   Comparison(s): Changes from prior study are noted. 10/30/2022: LVEF  40-45%, modearate to severe MR.   Conclusion(s)/Recommendation(s): Consider repeat limited echo when the HR  is lower.   12/18 IMPRESSIONS     1. Left ventricular ejection fraction, by estimation, is 20%. The left  ventricle has severely decreased function.   2. Right ventricular systolic function is severely reduced. The right  ventricular size is mildly enlarged.   3. Left atrial size was moderately dilated. No left atrial/left atrial  appendage thrombus was detected. The LAA emptying velocity was 35 cm/s.   4. Jazelle Achey small pericardial effusion is present.   5. The mitral valve is grossly normal. Severe mitral valve regurgitation,  likely secondary to LV dysfunction and atrial functional MR. Multiple  jets, appears commissural.   6. The aortic valve is grossly normal. Aortic valve regurgitation is  mild.   7. There is mild (Grade II) atheroma plaque involving the aortic arch and  descending aorta.   Conclusion(s)/Recommendation(s): No LA/LAA thrombus identified. Successful  cardioversion performed with restoration of normal sinus rhythm.   11/21 IMPRESSIONS     1. Left ventricular ejection fraction, by estimation, is 40 to 45%. The  left ventricle has mildly decreased function. The left ventricle  demonstrates global hypokinesis. There is moderate asymmetric left  ventricular hypertrophy of  the basal-septal  segment. Left ventricular diastolic parameters are indeterminate.   2. Right ventricular systolic function is normal. The right ventricular  size is normal. There is normal pulmonary artery systolic pressure.   3. Left atrial size was mildly dilated.   4. The mitral valve is abnormal. Moderate to severe  mitral valve  regurgitation. No evidence of mitral stenosis.   5. The aortic valve was not well visualized. Aortic valve regurgitation  is trivial. No aortic stenosis is present.   6. The inferior vena cava is dilated in size with >50% respiratory  variability, suggesting right atrial pressure of 8 mmHg.   Comparison(s): MR is worse from prior report.  Antimicrobials:  Anti-infectives (From admission, onward)    Start     Dose/Rate Route Frequency Ordered Stop   12/01/22 1800  amoxicillin (AMOXIL) capsule 500 mg        500 mg Oral Every 8 hours 12/01/22 1152 12/07/22 0940   12/01/22 0730  amoxicillin (AMOXIL) capsule 500 mg  Status:  Discontinued        500 mg Oral Every 8 hours 12/01/22 0637 12/01/22 1152       Subjective: C/o sharp chest discomfort this AM after eating  Objective: Vitals:   12/08/22 2018 12/09/22 0005 12/09/22 0420 12/09/22 1300  BP: 138/76 (!) 142/73 (!) 140/76 (!) 158/78  Pulse: (!) 56  63 65  Resp: '18 20 16 16  '$ Temp: 97.8 F (36.6 C) 98.3 F (36.8 C) 97.9 F (36.6 C) 97.8 F (36.6 C)  TempSrc: Oral Oral Oral Oral  SpO2: 100% 99% 99% 97%  Weight:   64.9 kg   Height:        Intake/Output Summary (Last 24 hours) at 12/09/2022 1529 Last data filed at 12/09/2022 0900 Gross per 24 hour  Intake 237 ml  Output 300 ml  Net -63 ml   Filed Weights   12/07/22 0702 12/08/22 0541 12/09/22 0420  Weight: 65.8 kg 66.2 kg 64.9 kg    Examination:  General: No acute distress. Cardiovascular: RRR Lungs: unlabored Abdomen: Soft, nontender, nondistended Neurological: Alert and oriented 3. Moves all extremities 4 with equal strength. Cranial nerves II through XII grossly intact. Extremities: No clubbing or cyanosis. No edema.   Data Reviewed: I have personally reviewed following labs and imaging studies  CBC: Recent Labs  Lab 12/05/22 0358 12/06/22 0341 12/07/22 0452 12/08/22 0144  WBC 10.1 11.3* 8.6 10.0  NEUTROABS  --  7.7  --  5.8  HGB 12.4  11.7* 11.4* 11.5*  HCT 39.2 36.5 35.1* 35.5*  MCV 87.9 88.2 88.0 87.2  PLT 232 212 224 989    Basic Metabolic Panel: Recent Labs  Lab 12/05/22 0358 12/06/22 0341 12/07/22 0452 12/08/22 0144  NA 141 139 140 140  K 4.0 4.0 3.7 3.9  CL 105 104 104 104  CO2 '26 26 29 28  '$ GLUCOSE 95 82 122* 108*  BUN '12 13 14 14  '$ CREATININE 1.27* 1.21* 1.30* 1.27*  CALCIUM 9.7 9.6 9.4 9.6  MG 2.1 2.1 2.1 2.2  PHOS  --  3.4 3.5  --     GFR: Estimated Creatinine Clearance: 35 mL/min (Janesia Joswick) (by C-G formula based on SCr of 1.27 mg/dL (H)).  Liver Function Tests: Recent Labs  Lab 12/06/22 0341 12/08/22 0144  AST 30 30  ALT 22 22  ALKPHOS 52 54  BILITOT 1.2 1.0  PROT 6.1* 5.9*  ALBUMIN 3.4* 3.4*    CBG: Recent Labs  Lab 12/07/22 1749  GLUCAP 97     No results found for this or any previous visit (from the past 240 hour(s)).        Radiology Studies: No results found.      Scheduled Meds:  amiodarone  200 mg Oral Daily   apixaban  5 mg Oral BID   vitamin B-12  1,000 mcg Oral Daily   famotidine  20 mg Oral Daily   feeding supplement  237 mL Oral TID BM   Gerhardt's butt cream   Topical BID   levothyroxine  100 mcg Oral Q0600   midodrine  5 mg Oral BID WC   mouth rinse  15 mL Mouth Rinse 4 times per day   rosuvastatin  10 mg Oral Daily   senna-docusate  2 tablet Oral BID   Continuous Infusions:   LOS: 16 days    Time spent: over 30 min    Fayrene Helper, MD [facility]   To contact the attending provider between 7A-7P or the covering provider during after hours 7P-7A, please log into the web site [email or web address] and access using universal Lane password for that web site. If you do not have the password, please call the hospital operator.  12/09/2022, 3:29 PM

## 2022-12-10 ENCOUNTER — Other Ambulatory Visit: Payer: Self-pay

## 2022-12-10 ENCOUNTER — Encounter (HOSPITAL_COMMUNITY): Payer: Self-pay

## 2022-12-10 ENCOUNTER — Inpatient Hospital Stay (HOSPITAL_COMMUNITY)
Admit: 2022-12-10 | Discharge: 2022-12-26 | DRG: 056 | Disposition: A | Discharge status: Home Health | Payer: Medicare HMO | Source: Intra-hospital | Attending: Physical Medicine & Rehabilitation | Admitting: Physical Medicine & Rehabilitation

## 2022-12-10 DIAGNOSIS — J189 Pneumonia, unspecified organism: Secondary | ICD-10-CM | POA: Diagnosis present

## 2022-12-10 DIAGNOSIS — I4891 Unspecified atrial fibrillation: Secondary | ICD-10-CM | POA: Diagnosis present

## 2022-12-10 DIAGNOSIS — Z8673 Personal history of transient ischemic attack (TIA), and cerebral infarction without residual deficits: Secondary | ICD-10-CM | POA: Diagnosis not present

## 2022-12-10 DIAGNOSIS — K219 Gastro-esophageal reflux disease without esophagitis: Secondary | ICD-10-CM | POA: Diagnosis present

## 2022-12-10 DIAGNOSIS — Z833 Family history of diabetes mellitus: Secondary | ICD-10-CM | POA: Diagnosis not present

## 2022-12-10 DIAGNOSIS — Z96642 Presence of left artificial hip joint: Secondary | ICD-10-CM | POA: Diagnosis present

## 2022-12-10 DIAGNOSIS — Z981 Arthrodesis status: Secondary | ICD-10-CM | POA: Diagnosis not present

## 2022-12-10 DIAGNOSIS — N390 Urinary tract infection, site not specified: Secondary | ICD-10-CM | POA: Diagnosis present

## 2022-12-10 DIAGNOSIS — I4892 Unspecified atrial flutter: Secondary | ICD-10-CM | POA: Diagnosis present

## 2022-12-10 DIAGNOSIS — I69398 Other sequelae of cerebral infarction: Secondary | ICD-10-CM | POA: Diagnosis present

## 2022-12-10 DIAGNOSIS — E039 Hypothyroidism, unspecified: Secondary | ICD-10-CM | POA: Diagnosis present

## 2022-12-10 DIAGNOSIS — F0152 Vascular dementia, unspecified severity, with psychotic disturbance: Secondary | ICD-10-CM | POA: Diagnosis present

## 2022-12-10 DIAGNOSIS — F0154 Vascular dementia, unspecified severity, with anxiety: Secondary | ICD-10-CM | POA: Diagnosis present

## 2022-12-10 DIAGNOSIS — Z87891 Personal history of nicotine dependence: Secondary | ICD-10-CM

## 2022-12-10 DIAGNOSIS — I69354 Hemiplegia and hemiparesis following cerebral infarction affecting left non-dominant side: Secondary | ICD-10-CM | POA: Diagnosis not present

## 2022-12-10 DIAGNOSIS — Z801 Family history of malignant neoplasm of trachea, bronchus and lung: Secondary | ICD-10-CM | POA: Diagnosis not present

## 2022-12-10 DIAGNOSIS — I63031 Cerebral infarction due to thrombosis of right carotid artery: Secondary | ICD-10-CM | POA: Diagnosis not present

## 2022-12-10 DIAGNOSIS — K5900 Constipation, unspecified: Secondary | ICD-10-CM | POA: Diagnosis present

## 2022-12-10 DIAGNOSIS — Z885 Allergy status to narcotic agent status: Secondary | ICD-10-CM

## 2022-12-10 DIAGNOSIS — Z79890 Hormone replacement therapy (postmenopausal): Secondary | ICD-10-CM

## 2022-12-10 DIAGNOSIS — Z886 Allergy status to analgesic agent status: Secondary | ICD-10-CM

## 2022-12-10 DIAGNOSIS — Z79899 Other long term (current) drug therapy: Secondary | ICD-10-CM

## 2022-12-10 DIAGNOSIS — L89312 Pressure ulcer of right buttock, stage 2: Secondary | ICD-10-CM | POA: Diagnosis present

## 2022-12-10 DIAGNOSIS — Z8711 Personal history of peptic ulcer disease: Secondary | ICD-10-CM

## 2022-12-10 DIAGNOSIS — F419 Anxiety disorder, unspecified: Secondary | ICD-10-CM | POA: Diagnosis present

## 2022-12-10 DIAGNOSIS — B952 Enterococcus as the cause of diseases classified elsewhere: Secondary | ICD-10-CM | POA: Diagnosis present

## 2022-12-10 DIAGNOSIS — Z8249 Family history of ischemic heart disease and other diseases of the circulatory system: Secondary | ICD-10-CM

## 2022-12-10 DIAGNOSIS — I63511 Cerebral infarction due to unspecified occlusion or stenosis of right middle cerebral artery: Secondary | ICD-10-CM

## 2022-12-10 DIAGNOSIS — Z8261 Family history of arthritis: Secondary | ICD-10-CM | POA: Diagnosis not present

## 2022-12-10 DIAGNOSIS — N183 Chronic kidney disease, stage 3 unspecified: Secondary | ICD-10-CM | POA: Diagnosis present

## 2022-12-10 DIAGNOSIS — F01511 Vascular dementia, unspecified severity, with agitation: Secondary | ICD-10-CM | POA: Diagnosis not present

## 2022-12-10 DIAGNOSIS — Z882 Allergy status to sulfonamides status: Secondary | ICD-10-CM

## 2022-12-10 DIAGNOSIS — Z888 Allergy status to other drugs, medicaments and biological substances status: Secondary | ICD-10-CM

## 2022-12-10 DIAGNOSIS — R110 Nausea: Secondary | ICD-10-CM | POA: Diagnosis present

## 2022-12-10 DIAGNOSIS — I6329 Cerebral infarction due to unspecified occlusion or stenosis of other precerebral arteries: Secondary | ICD-10-CM | POA: Diagnosis present

## 2022-12-10 DIAGNOSIS — I951 Orthostatic hypotension: Secondary | ICD-10-CM | POA: Diagnosis not present

## 2022-12-10 DIAGNOSIS — Z7901 Long term (current) use of anticoagulants: Secondary | ICD-10-CM

## 2022-12-10 LAB — PHOSPHORUS: Phosphorus: 3.5 mg/dL (ref 2.5–4.6)

## 2022-12-10 LAB — BASIC METABOLIC PANEL
Anion gap: 11 (ref 5–15)
BUN: 11 mg/dL (ref 8–23)
CO2: 27 mmol/L (ref 22–32)
Calcium: 9.8 mg/dL (ref 8.9–10.3)
Chloride: 101 mmol/L (ref 98–111)
Creatinine, Ser: 1.09 mg/dL — ABNORMAL HIGH (ref 0.44–1.00)
GFR, Estimated: 51 mL/min — ABNORMAL LOW (ref >60)
Glucose, Bld: 96 mg/dL (ref 70–99)
Potassium: 3.8 mmol/L (ref 3.5–5.1)
Sodium: 139 mmol/L (ref 135–145)

## 2022-12-10 LAB — CBC
HCT: 39.1 % (ref 36.0–46.0)
Hemoglobin: 12.1 g/dL (ref 12.0–15.0)
MCH: 27.4 pg (ref 26.0–34.0)
MCHC: 30.9 g/dL (ref 30.0–36.0)
MCV: 88.5 fL (ref 80.0–100.0)
Platelets: 241 10*3/uL (ref 150–400)
RBC: 4.42 MIL/uL (ref 3.87–5.11)
RDW: 15.8 % — ABNORMAL HIGH (ref 11.5–15.5)
WBC: 9.7 10*3/uL (ref 4.0–10.5)
nRBC: 0 % (ref 0.0–0.2)

## 2022-12-10 LAB — MAGNESIUM: Magnesium: 2.4 mg/dL (ref 1.7–2.4)

## 2022-12-10 MED ORDER — VITAMIN B-12 1000 MCG PO TABS
1000.0000 ug | ORAL_TABLET | Freq: Every day | ORAL | Status: DC
  Administered 2022-12-11 – 2022-12-26 (×16): 1000 ug via ORAL
  Filled 2022-12-10 (×16): qty 1

## 2022-12-10 MED ORDER — CYANOCOBALAMIN 1000 MCG PO TABS: 1000.0000 ug | ORAL_TABLET | Freq: Every day | ORAL | Status: DC

## 2022-12-10 MED ORDER — MIDODRINE HCL 5 MG PO TABS
5.0000 mg | ORAL_TABLET | Freq: Two times a day (BID) | ORAL | Status: DC
  Administered 2022-12-10 – 2022-12-12 (×5): 5 mg via ORAL
  Filled 2022-12-10 (×5): qty 1

## 2022-12-10 MED ORDER — ONDANSETRON HCL 4 MG PO TABS
4.0000 mg | ORAL_TABLET | Freq: Four times a day (QID) | ORAL | Status: DC | PRN
  Administered 2022-12-11 – 2022-12-14 (×3): 4 mg via ORAL
  Filled 2022-12-10 (×4): qty 1

## 2022-12-10 MED ORDER — CALCIUM CARBONATE ANTACID 500 MG PO CHEW
1.0000 | CHEWABLE_TABLET | Freq: Three times a day (TID) | ORAL | Status: DC | PRN
  Administered 2022-12-14 – 2022-12-15 (×2): 200 mg via ORAL
  Filled 2022-12-10 (×3): qty 1

## 2022-12-10 MED ORDER — APIXABAN 5 MG PO TABS
5.0000 mg | ORAL_TABLET | Freq: Two times a day (BID) | ORAL | Status: DC
  Administered 2022-12-10 – 2022-12-26 (×32): 5 mg via ORAL
  Filled 2022-12-10 (×32): qty 1

## 2022-12-10 MED ORDER — AMIODARONE HCL 200 MG PO TABS
200.0000 mg | ORAL_TABLET | Freq: Every day | ORAL | Status: DC
  Administered 2022-12-11 – 2022-12-26 (×16): 200 mg via ORAL
  Filled 2022-12-10 (×16): qty 1

## 2022-12-10 MED ORDER — MELATONIN 5 MG PO TABS
5.0000 mg | ORAL_TABLET | Freq: Every evening | ORAL | Status: DC | PRN
  Administered 2022-12-10 – 2022-12-23 (×11): 5 mg via ORAL
  Filled 2022-12-10 (×11): qty 1

## 2022-12-10 MED ORDER — FAMOTIDINE 20 MG PO TABS
20.0000 mg | ORAL_TABLET | Freq: Every day | ORAL | Status: DC
  Administered 2022-12-11 – 2022-12-26 (×16): 20 mg via ORAL
  Filled 2022-12-10 (×16): qty 1

## 2022-12-10 MED ORDER — AMIODARONE HCL 200 MG PO TABS: 200.0000 mg | ORAL_TABLET | Freq: Every day | ORAL | Status: DC

## 2022-12-10 MED ORDER — MIDODRINE HCL 5 MG PO TABS: 5.0000 mg | ORAL_TABLET | Freq: Three times a day (TID) | ORAL | Status: DC

## 2022-12-10 MED ORDER — ACETAMINOPHEN 325 MG PO TABS
325.0000 mg | ORAL_TABLET | ORAL | Status: DC | PRN
  Administered 2022-12-15 – 2022-12-23 (×5): 650 mg via ORAL
  Filled 2022-12-10 (×5): qty 2

## 2022-12-10 MED ORDER — OFF THE BEAT BOOK
Freq: Once | Status: DC
  Filled 2022-12-10: qty 1

## 2022-12-10 MED ORDER — SENNOSIDES-DOCUSATE SODIUM 8.6-50 MG PO TABS
2.0000 | ORAL_TABLET | Freq: Two times a day (BID) | ORAL | Status: DC
  Administered 2022-12-10 – 2022-12-12 (×4): 2 via ORAL
  Filled 2022-12-10 (×4): qty 2

## 2022-12-10 MED ORDER — IPRATROPIUM-ALBUTEROL 0.5-2.5 (3) MG/3ML IN SOLN: 3.0000 mL | RESPIRATORY_TRACT | Status: DC | PRN

## 2022-12-10 MED ORDER — LEVOTHYROXINE SODIUM 100 MCG PO TABS
100.0000 ug | ORAL_TABLET | Freq: Every day | ORAL | Status: DC
  Administered 2022-12-11 – 2022-12-26 (×16): 100 ug via ORAL
  Filled 2022-12-10 (×16): qty 1

## 2022-12-10 MED ORDER — LEVOTHYROXINE SODIUM 100 MCG PO TABS: 100.0000 ug | ORAL_TABLET | Freq: Every day | ORAL | Status: DC

## 2022-12-10 MED ORDER — MIDODRINE HCL 5 MG PO TABS: 5.0000 mg | ORAL_TABLET | Freq: Two times a day (BID) | ORAL | Status: DC

## 2022-12-10 NOTE — Progress Notes (Signed)
Patient arrived from [facility] at 1500. Assigned to room 4W01, [facility].

## 2022-12-10 NOTE — Progress Notes (Signed)
Inpatient Rehab Admissions Coordinator:  ° °I have a CIR bed for this Pt. Today. RN may call report to [phone]. ° °Nichoals Heyde, MS, CCC-SLP °Rehab Admissions Coordinator  °[phone] (celll) °[phone] (office) ° °

## 2022-12-10 NOTE — Progress Notes (Signed)
Patient admitted to room 4W01. Patient family present upon arrival and left shortly after before able to gather admission questions. Patient is alert and oriented 3x-4. Unsure of what day it is but knows she is in [place] and later states [facility], here for an evaluation she states. Patient is pleasant and has obvious intermittent confusion. Patient asks repetitive questions about why she is not getting therapy today and has to wait until tomorrow - explained to her a few times. Patient vital signs are stable, no abnormal values. Patient denies any shortness of breath or chest pain at this time. LBM 12/07/2022, history of constipation.  Patient asks why she is unable to get up, she does know to use the call bell and points to the red button in which she is to use to call for help. She states "I know better not to get up without help."  Patient recently had telesitter, but per report she has not been trying to get out of bed. Patient states she was glad to get rid of the "camera" because it yells at her and causes her anxiety even when she moves the slightest bit. Patient noted to have stage 2 wound above right buttock near sacrum. (2cm x 1cm x 0 cm). Photo uploaded to patient chart. No other obvious skin issues. Denies complaints of pain.

## 2022-12-10 NOTE — H&P (Signed)
Physical Medicine and Rehabilitation Admission H&P       HPI: Leslie [name]. Plack is a 82 year old right-handed female with history of prior CVA, CKD GERD recent bouts of cough treated with antibiotics and steroids was admitted with reports of 2-3 weeks of palpitations, seen by PCP for lightheadedness and dizziness found to have A-fib with RVR and sent to [facility] 10/29/2022 for evaluation.  She was started on intravenous Cardizem and heparin.  CT chest done due to episodes of chest pain and reports of cough revealed right layering pleural effusion and right upper lobe pneumonia.  She was started on ceftriaxone and Zithromax for CAP and underwent thoracentesis of 500 cc parapneumonic effusion by PCCM Dr. Martinique consulted for management of A-fib and felt that new onset likely due to pneumonia.  Echocardiogram with ejection fraction of 40 to 45% with LV global hypokinesis with moderate asymmetric left LVH moderate to severe MVR and dilated inferior vena cava.  She was started on metoprolol but reported shortness of breath side effects therefore was discontinued Cardizem was titrated.  She was transition to Eliquis.  On 11/25 patient had sudden onset of right gaze deviation with left hemiparesis and neglect.  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  Neurology felt stroke was embolic due to A-fib with RVR even on Eliquis.  Follow-up MRI brain done revealing small acute infarct right frontal temporal lobe and caudate body with advanced chronic small vessel disease.  Dr. Curt Bears following for ongoing issues with A-flutter converted with IV amiodarone 11/25 and Cardizem titrated but heart rates continued to run 120s at rest.  Modified barium swallow done revealing moderate spillage with buccal residual and delay in swallow with occasional trace aspiration.  She was placed on a  dysphagia #3 diet.  Patient was admitted to inpatient rehab services 11/08/2022 with bouts of orthostasis A-fib heart rate in the 160s.  On 11/23/2022 she was started on fluid bolus due to hypotension.  Dr. Percival Spanish was consulted recommending intravenous amiodarone bolus for rate control.  Dr. Lorrine Kin consulted for hospitalist and assistance to discharge to acute care services as patient would have to remain on telemetry.  Patient is stabilized cardiac rate controlled she remains on Eliquis as well as amiodarone 200 mg daily.  Hospital course complicated by Enterococcus UTI completing 7-day course of antibiotics.  Due to patient decreased functional mobility left-sided inattention was admitted for a comprehensive rehab program.   Review of Systems  Constitutional:  Negative for chills and fever.  HENT:  Negative for hearing loss.   Eyes:  Negative for blurred vision and double vision.  Respiratory:  Positive for cough and shortness of breath. Negative for wheezing.   Cardiovascular:  Positive for palpitations. Negative for chest pain and leg swelling.  Gastrointestinal:  Positive for constipation. Negative for heartburn and nausea.  Genitourinary:  Negative for dysuria, flank pain and hematuria.  Musculoskeletal:  Positive for joint pain and myalgias.  Skin:  Negative for rash.  Psychiatric/Behavioral:  The patient has insomnia.   All other systems reviewed and are negative.       Past Medical History:  Diagnosis Date   Arthritis     Back pain due to injury      fell off a ladder and fractured back-T12 down (multiple surgeries w/ onging pain)   Chronic kidney disease      "stage 3 kidney failure" improving per pt  Hypothyroidism     Insomnia     Plantar fasciitis     Stomach ulcer ~2000   Stroke [facility])      TIA X2   2001, jan 2017   Vertigo, benign paroxysmal, bilateral     Vitamin D deficiency           Past Surgical History:  Procedure Laterality Date   ABDOMINAL  HYSTERECTOMY   1974   ANKLE FUSION Right 06/07/2016    Procedure: RIGHT TALONAVICULAR ARTHRODESIS ;  Surgeon: Wylene Simmer, MD;  Location: [place];  Service: Orthopedics;  Laterality: Right;   APPENDECTOMY       BACK SURGERY        x4  fusion T12 thru sacrum; U rod in sacrum   BUBBLE STUDY   11/26/2022    Procedure: BUBBLE STUDY;  Surgeon: Elouise Munroe, MD;  Location: [place];  Service: Cardiovascular;;   CARDIOVERSION N/A 11/26/2022    Procedure: CARDIOVERSION;  Surgeon: Elouise Munroe, MD;  Location: [facility] ENDOSCOPY;  Service: Cardiovascular;  Laterality: N/A;   CARDIOVERSION N/A 12/05/2022    Procedure: CARDIOVERSION;  Surgeon: Pixie Casino, MD;  Location: [facility] ENDOSCOPY;  Service: Cardiovascular;  Laterality: N/A;   CATARACT EXTRACTION, BILATERAL Bilateral 2015   GASTROCNEMIUS RECESSION Right 06/07/2016    Procedure: RIGHT GASTROC RECESSION ;  Surgeon: Wylene Simmer, MD;  Location: [place];  Service: Orthopedics;  Laterality: Right;   HAND SURGERY Left 2003    x2   IR ANGIO EXTRACRAN SEL COM CAROTID INNOMINATE UNI L MOD SED   11/03/2022   IR CT HEAD LTD   11/03/2022   IR PERCUTANEOUS ART THROMBECTOMY/INFUSION INTRACRANIAL INC DIAG ANGIO   11/03/2022   L heel surgery for torn tendon Left 2015   PLANTAR FASCIA SURGERY Right 2009    surgery at [facility] 11/03/2022    Procedure: IR WITH ANESTHESIA;  Surgeon: Radiologist, Medication, MD;  Location: [place];  Service: Radiology;  Laterality: N/A;   SHOULDER ARTHROSCOPY Right 2007   TEE WITHOUT CARDIOVERSION N/A 11/26/2022    Procedure: TRANSESOPHAGEAL ECHOCARDIOGRAM (TEE);  Surgeon: Elouise Munroe, MD;  Location: [facility] ENDOSCOPY;  Service: Cardiovascular;  Laterality: N/A;   TENDON REPAIR Right 1949    foot- lacerated tendon in arch of foot   THORACENTESIS N/A 10/30/2022    Procedure: THORACENTESIS;  Surgeon: Julian Hy, DO;  Location: [place];  Service:  Cardiopulmonary;  Laterality: N/A;   TOTAL HIP ARTHROPLASTY Left 10/05/2021    Procedure: TOTAL HIP ARTHROPLASTY ANTERIOR APPROACH;  Surgeon: Rod Can, MD;  Location: [facility];  Service: Orthopedics;  Laterality: Left;         Family History  Problem Relation Age of Onset   Hypertension Mother     Lung cancer Mother     Lung cancer Father     Diabetes Sister     Hypertension Sister     Congestive Heart Failure Sister     Arthritis Brother      Social History:  reports that she quit smoking about 36 years ago. Her smoking use included cigarettes. She has never used smokeless tobacco. She reports that she does not drink alcohol and does not use drugs. Allergies:       Allergies  Allergen Reactions   Duloxetine Nausea Only   Voltaren [Diclofenac Sodium] Shortness Of Breath and Palpitations   Lisinopril Other (See Comments)   Aspirin Other (See Comments)  ulcers   Diphenhydramine Other (See Comments)   Gabapentin Itching   Metoprolol        Cold sweat/exhaustion    Ultram [Tramadol Hcl] Other (See Comments)      Effects cognitive abilities    Proton Pump Inhibitors Rash      Itching, rash    No medications prior to admission.          Home: Home Living Family/patient expects to be discharged to:: Unsure Living Arrangements: Alone Available Help at Discharge: Family, Available 24 hours/day Type of Home: House Home Access: Stairs to enter [employer] of Steps: 2 Entrance Stairs-Rails: Right, Left Home Layout: Two level, Able to live on main level with bedroom/bathroom Alternate Level Stairs-Number of Steps: 4 steps down to main living room w/ rail Bathroom Shower/Tub: Multimedia programmer: Standard Bathroom Accessibility: Yes Home Equipment: Conservation officer, nature (2 wheels), [employer] - single point, Electronics engineer Comments: Daughter to arrange 24/7 supervision; she is here with her spouse from [place] With: Alone   Functional  History: Prior Function Prior Level of Function : Independent/Modified Independent, Driving Mobility Comments: Mod indep with intermittent use of SPC ADLs Comments: stands in walk-in shower   Functional Status:  Mobility: Bed Mobility Overal bed mobility: Needs Assistance Bed Mobility: Supine to Sit, Sit to Supine Rolling: Supervision Supine to sit: Min assist Sit to supine: Min assist General bed mobility comments: Assist to elevate trunk into sitting and to bring legs back up into bed returning to supine. Transfers Overall transfer level: Needs assistance Equipment used: Rolling walker (2 wheels) Transfers: Sit to/from Stand Sit to Stand: Min assist Bed to/from chair/wheelchair/BSC transfer type:: Step pivot Step pivot transfers: Min assist General transfer comment: Assist to bring hips up and for balance Ambulation/Gait Ambulation/Gait assistance: Min assist Gait Distance (Feet): 140 Feet Assistive device: Rolling walker (2 wheels) Gait Pattern/deviations: Step-through pattern General Gait Details: Assist for balance and support. Pt denied any dizziness or light headedness Gait velocity: decreased Gait velocity interpretation: 1.31 - 2.62 ft/sec, indicative of limited community ambulator   ADL: ADL Overall ADL's : Needs assistance/impaired Eating/Feeding: Set up Eating/Feeding Details (indicate cue type and reason): eating with HOB 30* upon arrival Grooming: Oral care, Maximal assistance, Sitting Upper Body Bathing: Minimal assistance, Sitting, Bed level Lower Body Bathing: Moderate assistance, Sit to/from stand Upper Body Dressing : Minimal assistance, Sitting Lower Body Dressing: Sitting/lateral leans, Maximal assistance Lower Body Dressing Details (indicate cue type and reason): donning socks and ted hose Toilet Transfer: Minimal assistance, Stand-pivot, Rolling walker (2 wheels) Toilet Transfer Details (indicate cue type and reason): simulated to  recliner Toileting- Clothing Manipulation and Hygiene: Moderate assistance, Bed level Toileting - Clothing Manipulation Details (indicate cue type and reason): bridging in supine to assist with doffing mesh underwear Functional mobility during ADLs: Minimal assistance, Cueing for safety, Cueing for sequencing, Rolling walker (2 wheels) General ADL Comments: significantly increased time and cues needed for all funcitonal tasks   Cognition: Cognition Overall Cognitive Status: Impaired/Different from baseline Arousal/Alertness: Awake/alert Orientation Level: Oriented X4 Year: 2023 Month: December Day of Week: Incorrect Attention: Focused, Sustained Focused Attention: Appears intact Sustained Attention: Impaired Memory: Impaired Memory Impairment: Storage deficit, Retrieval deficit, Decreased recall of new information Awareness: Impaired Awareness Impairment: Intellectual impairment, Emergent impairment Problem Solving: Impaired Problem Solving Impairment: Functional basic, Verbal basic Executive Function: Reasoning, Sequencing, Organizing, Decision Making, Self Monitoring, Self Correcting Reasoning: Impaired Reasoning Impairment: Verbal basic, Functional basic Sequencing: Impaired Sequencing Impairment: Verbal basic, Functional basic  Organizing: Impaired Organizing Impairment: Verbal basic, Functional basic Decision Making: Impaired Decision Making Impairment: Verbal basic, Functional basic Initiating: Impaired Initiating Impairment: Verbal basic, Functional basic Self Monitoring: Impaired Self Monitoring Impairment: Verbal basic, Functional basic Self Correcting: Impaired Self Correcting Impairment: Verbal basic, Functional basic Behaviors: Perseveration Safety/Judgment: Impaired Cognition Arousal/Alertness: Awake/alert Behavior During Therapy: WFL for tasks assessed/performed Overall Cognitive Status: Impaired/Different from baseline Area of Impairment: Attention, Memory,  Following commands, Safety/judgement, Awareness, Problem solving Orientation Level: Place, Situation Current Attention Level: Selective Memory: Decreased recall of precautions, Decreased short-term memory Following Commands: Follows one step commands consistently Safety/Judgement: Decreased awareness of deficits, Decreased awareness of safety Awareness: Intellectual Problem Solving: Slow processing, Requires verbal cues, Requires tactile cues General Comments: highly distractable, poor attention. needs a non-distracting, clear environment and maximal verbal cues. Pt perseverating on having "crumbs" in her teeth throughout sesssion despite thorough oral care   Physical Exam: Blood pressure 111/69, pulse 75, temperature 97.7 F (36.5 C), temperature source Oral, resp. rate 16, height 5' 7.5" (1.715 m), weight 63.4 kg, SpO2 97 %. Physical Exam Neurological:     Comments: Patient is alert.  Follows commands.  Provides name and age.      General: No acute distress Mood and affect are appropriate Heart: Regular rate and rhythm no rubs murmurs or extra sounds Lungs: Clear to auscultation, breathing unlabored, no rales or wheezes Abdomen: Positive bowel sounds, soft nontender to palpation, nondistended Extremities: No clubbing, cyanosis, or edema Skin: No evidence of breakdown, no evidence of rash Neurologic: Cranial nerves II through XII intact, motor strength is 5/5 in bilateral deltoid, bicep, tricep, grip, hip flexor, knee extensors, ankle dorsiflexor and plantar flexor  Cerebellar exam normal finger to nose to finger as well as heel to shin in bilateral upper and lower extremities Musculoskeletal: Full range of motion in all 4 extremities. No joint swelling    Lab Results Last 48 Hours        Results for orders placed or performed during the hospital encounter of 11/23/22 (from the past 48 hour(s))  Basic metabolic panel     Status: Abnormal    Collection Time: 12/10/22  1:47 AM   Result Value Ref Range    Sodium 139 135 - 145 mmol/L    Potassium 3.8 3.5 - 5.1 mmol/L    Chloride 101 98 - 111 mmol/L    CO2 27 22 - 32 mmol/L    Glucose, Bld 96 70 - 99 mg/dL      Comment: Glucose reference range applies only to samples taken after fasting for at least 8 hours.    BUN 11 8 - 23 mg/dL    Creatinine, Ser 1.09 (H) 0.44 - 1.00 mg/dL    Calcium 9.8 8.9 - 10.3 mg/dL    GFR, Estimated 51 (L) >60 mL/min      Comment: (NOTE) Calculated using the CKD-EPI Creatinine Equation (2021)      Anion gap 11 5 - 15      Comment: Performed at [facility] [address], [place] [address]  CBC     Status: Abnormal    Collection Time: 12/10/22  1:47 AM  Result Value Ref Range    WBC 9.7 4.0 - 10.5 K/uL    RBC 4.42 3.87 - 5.11 MIL/uL    Hemoglobin 12.1 12.0 - 15.0 g/dL    HCT 39.1 36.0 - 46.0 %    MCV 88.5 80.0 - 100.0 fL    MCH 27.4 26.0 - 34.0  pg    MCHC 30.9 30.0 - 36.0 g/dL    RDW 15.8 (H) 11.5 - 15.5 %    Platelets 241 150 - 400 K/uL    nRBC 0.0 0.0 - 0.2 %      Comment: Performed at [facility], [place] [address] [place], [place] [address]  Magnesium     Status: None    Collection Time: 12/10/22  1:47 AM  Result Value Ref Range    Magnesium 2.4 1.7 - 2.4 mg/dL      Comment: Performed at [facility] [address] [place], [place] [address]  Phosphorus     Status: None    Collection Time: 12/10/22  1:47 AM  Result Value Ref Range    Phosphorus 3.5 2.5 - 4.6 mg/dL      Comment: Performed at [facility] [address] [place],  [address]      Imaging Results (Last 48 hours)  No results found.         Blood pressure 111/69, pulse 75, temperature 97.7 F (36.5 C), temperature source Oral, resp. rate 16, height 5' 7.5" (1.715 m), weight 63.4 kg, SpO2 97 %.   Medical Problem List and Plan: 1. Functional deficits secondary to right MCA embolic infarction             -patient may  shower             -ELOS/Goals: 7-10d 2.   Antithrombotics: -DVT/anticoagulation:  Pharmaceutical: Eliquis             -antiplatelet therapy: N/A 3. Pain Management: Tylenol as needed 4. Mood/Behavior/Sleep: Provide emotional support             -antipsychotic agents: N/A 5. Neuropsych/cognition: This patient is capable of making decisions on her own behalf. 6. Skin/Wound Care: Routine skin checks 7. Fluids/Electrolytes/Nutrition: Routine in and outs with follow-up chemistries 8.  Atrial fibrillation.  Continue amiodarone 200 mg daily.  Eliquis as directed.  Cardiac rate controlled 9.  Orthostasis.  ProAmatine 5 mg twice daily.  Monitor with increased mobility 10.  Hypothyroidism.  Synthroid      Cathlyn Parsons, PA-C 12/10/2022 Charlett Blake [name] [facility] Fellow Am Acad of Phys Med and Rehab Diplomate Am Board of Electrodiagnostic Med Fellow Am Board of Interventional Pain

## 2022-12-10 NOTE — Care Management Important Message (Signed)
Important Message  Patient Details  Name: Leslie [name] MRN: [id number] Date of Birth: November 19, 1941   Medicare Important Message Given:  Yes     [name] 12/10/2022, 10:49 AM

## 2022-12-10 NOTE — H&P (Incomplete)
Physical Medicine and Rehabilitation Admission H&P     HPI: Leslie [name]. Leslie [name] is a 82 year old right-handed female with history of prior CVA, CKD GERD recent bouts of cough treated with antibiotics and steroids was admitted with reports of 2-3 weeks of palpitations, seen by PCP for lightheadedness and dizziness found to have A-fib with RVR and sent to [facility] 10/29/2022 for evaluation.  She was started on intravenous Cardizem and heparin.  CT chest done due to episodes of chest pain and reports of cough revealed right layering pleural effusion and right upper lobe pneumonia.  She was started on ceftriaxone and Zithromax for CAP and underwent thoracentesis of 500 cc parapneumonic effusion by PCCM Dr. Martinique consulted for management of A-fib and felt that new onset likely due to pneumonia.  Echocardiogram with ejection fraction of 40 to 45% with LV global hypokinesis with moderate asymmetric left LVH moderate to severe MVR and dilated inferior vena cava.  She was started on metoprolol but reported shortness of breath side effects therefore was discontinued Cardizem was titrated.  She was transition to Eliquis.  On 11/25 patient had sudden onset of right gaze deviation with left hemiparesis and neglect.  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  Neurology felt stroke was embolic due to A-fib with RVR even on Eliquis.  Follow-up MRI brain done revealing small acute infarct right frontal temporal lobe and caudate body with advanced chronic small vessel disease.  Dr. Curt Bears following for ongoing issues with A-flutter converted with IV amiodarone 11/25 and Cardizem titrated but heart rates continued to run 120s at rest.  Modified barium swallow done revealing moderate spillage with buccal residual and delay in swallow with occasional trace aspiration.  She was placed on a  dysphagia #3 diet.  Patient was admitted to inpatient rehab services 11/08/2022 with bouts of orthostasis A-fib heart rate in the 160s.  On 11/23/2022 she was started on fluid bolus due to hypotension.  Dr. Percival Spanish was consulted recommending intravenous amiodarone bolus for rate control.  Dr. Lorrine Kin consulted for hospitalist and assistance to discharge to acute care services as patient would have to remain on telemetry.  Patient is stabilized cardiac rate controlled she remains on Eliquis as well as amiodarone 200 mg daily.  Hospital course complicated by Enterococcus UTI completing 7-day course of antibiotics.  Due to patient decreased functional mobility left-sided inattention was admitted for a comprehensive rehab program.  Review of Systems  Constitutional:  Negative for chills and fever.  HENT:  Negative for hearing loss.   Eyes:  Negative for blurred vision and double vision.  Respiratory:  Positive for cough and shortness of breath. Negative for wheezing.   Cardiovascular:  Positive for palpitations. Negative for chest pain and leg swelling.  Gastrointestinal:  Positive for constipation. Negative for heartburn and nausea.  Genitourinary:  Negative for dysuria, flank pain and hematuria.  Musculoskeletal:  Positive for joint pain and myalgias.  Skin:  Negative for rash.  Psychiatric/Behavioral:  The patient has insomnia.   All other systems reviewed and are negative.  Past Medical History:  Diagnosis Date   Arthritis    Back pain due to injury    fell off a ladder and fractured back-T12 down (multiple surgeries w/ onging pain)   Chronic kidney disease    "stage 3 kidney failure" improving per pt   Hypothyroidism    Insomnia    Plantar  fasciitis    Stomach ulcer ~2000   Stroke [facility])    TIA X2   2001, jan 2017   Vertigo, benign paroxysmal, bilateral    Vitamin D deficiency    Past Surgical History:  Procedure Laterality Date   ABDOMINAL HYSTERECTOMY  1974   ANKLE FUSION  Right 06/07/2016   Procedure: RIGHT TALONAVICULAR ARTHRODESIS ;  Surgeon: Wylene Simmer, MD;  Location: [place];  Service: Orthopedics;  Laterality: Right;   APPENDECTOMY     BACK SURGERY     x4  fusion T12 thru sacrum; U rod in sacrum   BUBBLE STUDY  11/26/2022   Procedure: BUBBLE STUDY;  Surgeon: Elouise Munroe, MD;  Location: [place];  Service: Cardiovascular;;   CARDIOVERSION N/A 11/26/2022   Procedure: CARDIOVERSION;  Surgeon: Elouise Munroe, MD;  Location: [facility] ENDOSCOPY;  Service: Cardiovascular;  Laterality: N/A;   CARDIOVERSION N/A 12/05/2022   Procedure: CARDIOVERSION;  Surgeon: Pixie Casino, MD;  Location: [facility] ENDOSCOPY;  Service: Cardiovascular;  Laterality: N/A;   CATARACT EXTRACTION, BILATERAL Bilateral 2015   GASTROCNEMIUS RECESSION Right 06/07/2016   Procedure: RIGHT GASTROC RECESSION ;  Surgeon: Wylene Simmer, MD;  Location: [place];  Service: Orthopedics;  Laterality: Right;   HAND SURGERY Left 2003   x2   IR ANGIO EXTRACRAN SEL COM CAROTID INNOMINATE UNI L MOD SED  11/03/2022   IR CT HEAD LTD  11/03/2022   IR PERCUTANEOUS ART THROMBECTOMY/INFUSION INTRACRANIAL INC DIAG ANGIO  11/03/2022   L heel surgery for torn tendon Left 2015   PLANTAR FASCIA SURGERY Right 2009   surgery at [place] 11/03/2022   Procedure: IR WITH ANESTHESIA;  Surgeon: Radiologist, Medication, MD;  Location: [place];  Service: Radiology;  Laterality: N/A;   SHOULDER ARTHROSCOPY Right 2007   TEE WITHOUT CARDIOVERSION N/A 11/26/2022   Procedure: TRANSESOPHAGEAL ECHOCARDIOGRAM (TEE);  Surgeon: Elouise Munroe, MD;  Location: [facility] ENDOSCOPY;  Service: Cardiovascular;  Laterality: N/A;   TENDON REPAIR Right 1949   foot- lacerated tendon in arch of foot   THORACENTESIS N/A 10/30/2022   Procedure: THORACENTESIS;  Surgeon: Julian Hy, DO;  Location: [place];  Service: Cardiopulmonary;  Laterality: N/A;   TOTAL HIP ARTHROPLASTY  Left 10/05/2021   Procedure: TOTAL HIP ARTHROPLASTY ANTERIOR APPROACH;  Surgeon: Rod Can, MD;  Location: [facility];  Service: Orthopedics;  Laterality: Left;   Family History  Problem Relation Age of Onset   Hypertension Mother    Lung cancer Mother    Lung cancer Father    Diabetes Sister    Hypertension Sister    Congestive Heart Failure Sister    Arthritis Brother    Social History:  reports that she quit smoking about 36 years ago. Her smoking use included cigarettes. She has never used smokeless tobacco. She reports that she does not drink alcohol and does not use drugs. Allergies:  Allergies  Allergen Reactions   Duloxetine Nausea Only   Voltaren [Diclofenac Sodium] Shortness Of Breath and Palpitations   Lisinopril Other (See Comments)   Aspirin Other (See Comments)    ulcers   Diphenhydramine Other (See Comments)   Gabapentin Itching   Metoprolol     Cold sweat/exhaustion    Ultram [Tramadol Hcl] Other (See Comments)    Effects cognitive abilities    Proton Pump Inhibitors Rash    Itching, rash   No medications prior to admission.      Home: Home Living Family/patient  expects to be discharged to:: Unsure Living Arrangements: Alone Available Help at Discharge: Family, Available 24 hours/day Type of Home: House Home Access: Stairs to enter [employer] of Steps: 2 Entrance Stairs-Rails: Right, Left Home Layout: Two level, Able to live on main level with bedroom/bathroom Alternate Level Stairs-Number of Steps: 4 steps down to main living room w/ rail Bathroom Shower/Tub: Multimedia programmer: Standard Bathroom Accessibility: Yes Home Equipment: Conservation officer, nature (2 wheels), [employer] - single point, Electronics engineer Comments: Daughter to arrange 24/7 supervision; she is here with her spouse from [place] With: Alone   Functional History: Prior Function Prior Level of Function : Independent/Modified Independent, Driving Mobility  Comments: Mod indep with intermittent use of SPC ADLs Comments: stands in walk-in shower  Functional Status:  Mobility: Bed Mobility Overal bed mobility: Needs Assistance Bed Mobility: Supine to Sit, Sit to Supine Rolling: Supervision Supine to sit: Min assist Sit to supine: Min assist General bed mobility comments: Assist to elevate trunk into sitting and to bring legs back up into bed returning to supine. Transfers Overall transfer level: Needs assistance Equipment used: Rolling walker (2 wheels) Transfers: Sit to/from Stand Sit to Stand: Min assist Bed to/from chair/wheelchair/BSC transfer type:: Step pivot Step pivot transfers: Min assist General transfer comment: Assist to bring hips up and for balance Ambulation/Gait Ambulation/Gait assistance: Min assist Gait Distance (Feet): 140 Feet Assistive device: Rolling walker (2 wheels) Gait Pattern/deviations: Step-through pattern General Gait Details: Assist for balance and support. Pt denied any dizziness or light headedness Gait velocity: decreased Gait velocity interpretation: 1.31 - 2.62 ft/sec, indicative of limited community ambulator    ADL: ADL Overall ADL's : Needs assistance/impaired Eating/Feeding: Set up Eating/Feeding Details (indicate cue type and reason): eating with HOB 30* upon arrival Grooming: Oral care, Maximal assistance, Sitting Upper Body Bathing: Minimal assistance, Sitting, Bed level Lower Body Bathing: Moderate assistance, Sit to/from stand Upper Body Dressing : Minimal assistance, Sitting Lower Body Dressing: Sitting/lateral leans, Maximal assistance Lower Body Dressing Details (indicate cue type and reason): donning socks and ted hose Toilet Transfer: Minimal assistance, Stand-pivot, Rolling walker (2 wheels) Toilet Transfer Details (indicate cue type and reason): simulated to recliner Toileting- Clothing Manipulation and Hygiene: Moderate assistance, Bed level Toileting - Clothing Manipulation  Details (indicate cue type and reason): bridging in supine to assist with doffing mesh underwear Functional mobility during ADLs: Minimal assistance, Cueing for safety, Cueing for sequencing, Rolling walker (2 wheels) General ADL Comments: significantly increased time and cues needed for all funcitonal tasks  Cognition: Cognition Overall Cognitive Status: Impaired/Different from baseline Arousal/Alertness: Awake/alert Orientation Level: Oriented X4 Year: 2023 Month: December Day of Week: Incorrect Attention: Focused, Sustained Focused Attention: Appears intact Sustained Attention: Impaired Memory: Impaired Memory Impairment: Storage deficit, Retrieval deficit, Decreased recall of new information Awareness: Impaired Awareness Impairment: Intellectual impairment, Emergent impairment Problem Solving: Impaired Problem Solving Impairment: Functional basic, Verbal basic Executive Function: Reasoning, Sequencing, Organizing, Decision Making, Self Monitoring, Self Correcting Reasoning: Impaired Reasoning Impairment: Verbal basic, Functional basic Sequencing: Impaired Sequencing Impairment: Verbal basic, Functional basic Organizing: Impaired Organizing Impairment: Verbal basic, Functional basic Decision Making: Impaired Decision Making Impairment: Verbal basic, Functional basic Initiating: Impaired Initiating Impairment: Verbal basic, Functional basic Self Monitoring: Impaired Self Monitoring Impairment: Verbal basic, Functional basic Self Correcting: Impaired Self Correcting Impairment: Verbal basic, Functional basic Behaviors: Perseveration Safety/Judgment: Impaired Cognition Arousal/Alertness: Awake/alert Behavior During Therapy: WFL for tasks assessed/performed Overall Cognitive Status: Impaired/Different from baseline Area of Impairment: Attention, Memory, Following commands, Safety/judgement, Awareness, Problem  solving Orientation Level: Place, Situation Current Attention  Level: Selective Memory: Decreased recall of precautions, Decreased short-term memory Following Commands: Follows one step commands consistently Safety/Judgement: Decreased awareness of deficits, Decreased awareness of safety Awareness: Intellectual Problem Solving: Slow processing, Requires verbal cues, Requires tactile cues General Comments: highly distractable, poor attention. needs a non-distracting, clear environment and maximal verbal cues. Pt perseverating on having "crumbs" in her teeth throughout sesssion despite thorough oral care  Physical Exam: Blood pressure 111/69, pulse 75, temperature 97.7 F (36.5 C), temperature source Oral, resp. rate 16, height 5' 7.5" (1.715 m), weight 63.4 kg, SpO2 97 %. Physical Exam Neurological:     Comments: Patient is alert.  Follows commands.  Provides name and age.     Results for orders placed or performed during the hospital encounter of 11/23/22 (from the past 48 hour(s))  Basic metabolic panel     Status: Abnormal   Collection Time: 12/10/22  1:47 AM  Result Value Ref Range   Sodium 139 135 - 145 mmol/L   Potassium 3.8 3.5 - 5.1 mmol/L   Chloride 101 98 - 111 mmol/L   CO2 27 22 - 32 mmol/L   Glucose, Bld 96 70 - 99 mg/dL    Comment: Glucose reference range applies only to samples taken after fasting for at least 8 hours.   BUN 11 8 - 23 mg/dL   Creatinine, Ser 1.09 (H) 0.44 - 1.00 mg/dL   Calcium 9.8 8.9 - 10.3 mg/dL   GFR, Estimated 51 (L) >60 mL/min    Comment: (NOTE) Calculated using the CKD-EPI Creatinine Equation (2021)    Anion gap 11 5 - 15    Comment: Performed at [facility] [address] [place], [place] [address]  CBC     Status: Abnormal   Collection Time: 12/10/22  1:47 AM  Result Value Ref Range   WBC 9.7 4.0 - 10.5 K/uL   RBC 4.42 3.87 - 5.11 MIL/uL   Hemoglobin 12.1 12.0 - 15.0 g/dL   HCT 39.1 36.0 - 46.0 %   MCV 88.5 80.0 - 100.0 fL   MCH 27.4 26.0 - 34.0 pg   MCHC 30.9 30.0 - 36.0 g/dL   RDW  15.8 (H) 11.5 - 15.5 %   Platelets 241 150 - 400 K/uL   nRBC 0.0 0.0 - 0.2 %    Comment: Performed at [facility], [place] [address] [place], [place] [address]  Magnesium     Status: None   Collection Time: 12/10/22  1:47 AM  Result Value Ref Range   Magnesium 2.4 1.7 - 2.4 mg/dL    Comment: Performed at [facility] [address], [place], [place] [address]  Phosphorus     Status: None   Collection Time: 12/10/22  1:47 AM  Result Value Ref Range   Phosphorus 3.5 2.5 - 4.6 mg/dL    Comment: Performed at [facility] [address] [place], [place] [address]   No results found.    Blood pressure 111/69, pulse 75, temperature 97.7 F (36.5 C), temperature source Oral, resp. rate 16, height 5' 7.5" (1.715 m), weight 63.4 kg, SpO2 97 %.  Medical Problem List and Plan: 1. Functional deficits secondary to right MCA embolic infarction  -patient may *** shower  -ELOS/Goals: *** 2.  Antithrombotics: -DVT/anticoagulation:  Pharmaceutical: Eliquis  -antiplatelet therapy: N/A 3. Pain Management: Tylenol as needed 4. Mood/Behavior/Sleep: Provide emotional support  -antipsychotic agents: N/A 5. Neuropsych/cognition: This patient  is capable of making decisions on her own behalf. 6. Skin/Wound Care: Routine skin checks 7. Fluids/Electrolytes/Nutrition: Routine in and outs with follow-up chemistries 8.  Atrial fibrillation.  Continue amiodarone 200 mg daily.  Eliquis as directed.  Cardiac rate controlled 9.  Orthostasis.  ProAmatine 5 mg twice daily.  Monitor with increased mobility 10.  Hypothyroidism.  Synthroid  ***  Lavon Paganini Jedrick Hutcherson, PA-C 12/10/2022

## 2022-12-10 NOTE — Progress Notes (Signed)
Pt report called to 4W pt receiving RN. Thanks

## 2022-12-10 NOTE — Progress Notes (Signed)
Palliative-   Chart reviewed- pt stable and plans for d/c to CIR. Palliative will sign off as no further acute Palliative needs at this time.   Mariana Kaufman, AGNP-C Palliative Medicine  No charge

## 2022-12-10 NOTE — Progress Notes (Addendum)
Inpatient Rehabilitation Admission Medication Review by a Pharmacist  A complete drug regimen review was completed for this patient to identify any potential clinically significant medication issues.  High Risk Drug Classes Is patient taking? Indication by Medication  Antipsychotic No   Anticoagulant Yes Apixaban - atrial fibrillation and stroke prevention   Antibiotic No   Opioid No   Antiplatelet No   Hypoglycemics/insulin No   Vasoactive Medication Yes Midodrine - hypotension   Chemotherapy No   Other Yes Famotidine - GI prophylaxis Levothyroxine - thyroid supplement Amiodarone - AF     Type of Medication Issue Identified Description of Issue Recommendation(s)  Drug Interaction(s) (clinically significant)     Duplicate Therapy     Allergy     No Medication Administration End Date     Incorrect Dose     Additional Drug Therapy Needed     Significant med changes from prior encounter (inform family/care partners about these prior to discharge).    Other  PTA Meds not yet resumed: Crestor Restart PTA meds when and if necessary during CIR admission or at time of discharge, if warranted    Clinically significant medication issues were identified that warrant physician communication and completion of prescribed/recommended actions by midnight of the next day:  No  Name of provider notified for urgent issues identified:   Provider Method of Notification:     Pharmacist comments:   Time spent performing this drug regimen review (minutes):  Julian, PharmD Pharmacy Resident  12/10/2022 4:07 PM

## 2022-12-10 NOTE — TOC Transition Note (Signed)
Transition of Care [facility]) - CM/SW Discharge Note   Patient Details  Name: Lorea Kupfer MRN: [id number] Date of Birth: December 20, 1940  [facility] CM/SW Contact:  Bethena Roys, RN Phone Number: 12/10/2022, 9:56 AM   Clinical Narrative: Case Manager received notification that CIR has bed available for this patient. No further needs identified at this time.   Final next level of care: IP Rehab Facility Barriers to Discharge: No Barriers Identified  Discharge Plan and Services Additional resources added to the After Visit Summary for   In-house Referral: NA Discharge Planning Services: CM Consult Post Acute Care Choice: IP Rehab            Social Determinants of Health (SDOH) Interventions SDOH Screenings   Food Insecurity: No Food Insecurity (11/29/2022)  Housing: Low Risk  (11/29/2022)  Transportation Needs: No Transportation Needs (11/29/2022)  Utilities: Not At Risk (11/29/2022)  Tobacco Use: Medium Risk (12/08/2022)     Readmission Risk Interventions     No data to display

## 2022-12-10 NOTE — Progress Notes (Signed)
.  Mobility Specialist - Progress Note   12/10/22 0852  Mobility  Activity Ambulated with assistance in room  Level of Assistance Moderate assist, patient does 50-74%  Assistive Device Front wheel walker  Distance Ambulated (ft) 5 ft  Activity Response Tolerated well  Mobility Referral Yes  $Mobility charge 1 Mobility   Pt was received in chair and agreeable to mobility. Pt was MinA to stand. Pt had x1 bout of LOB and was ModA to correct. Pt was returned to chair with all needs met.   Franki Monte  Mobility Specialist Please contact via Solicitor or Rehab office at [phone]

## 2022-12-10 NOTE — Discharge Summary (Signed)
Physician Discharge Summary  Leslie [name] GEX:[id number] DOB: [birth year] DOA: 11/23/2022  PCP: Sonia Side., FNP  Admit date: 11/23/2022 Discharge date: 12/10/2022  Time spent: 40 minutes  Recommendations for Outpatient Follow-up:  Follow CBC/CMP  Per cardiology, repeat echo first week of January if she maintains SR (consider while in CIR vs outpatient) Follow orthostasis, currently doing well on midodrine, compression stockings, abdominal binder - adjust meds prn Outpatient neurology follow up for stroke (as well as cognitive deficits) Follow with cardiology outpatient for Leslie [name] - follow amiodarone outpatient Follow repeat thyroid function tests outpatient Follow vitamin b12 def outpatient  Discharge Diagnoses:  Principal Problem:   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 female with Leslie [name] history of hypothyroidism, GERD who presented to the ED on 10/29/2022 from PCP's office after finding new AFib with RVR. There was noted to have pneumonia with right pleural effusion treated with antibiotics and thoracentesis (transudate, negative Cx and cytology). Cardiology was consulted, eliquis started, and ultimately got rates under adequate control. On 11/25 she developed right gaze deviation, left hemiparesis and neglect due to right M1 occlusion, taken emergently for mechanical thrombectomy which restored TICI 3 flow. She stabilized, ultimately transferring  to CIR 11/30. While undergoing therapy, diltiazem dose was decreased due to orthostatic hypotension. She went recurrently into rapid atrial fibrillation with hypotension prompting readmission on 12/15. Subsequently underwent TEE/DCCV 12/18. Plan was to transfer back to CIR, though she's recurrently in rapid atrial fibrillation again 12/20. Amiodarone gtt initiated, ultimately tapered. the patient's atrial fibrillation/flutter rate is improved but not tolerated well. S/p DCCV 12/27.  Her course has been complicated by delirium and persistent orthostasis which have improved.  Treated for UTI.     She's been approved by CIR on appeal.  Stable for discharge 12/10/2022.   See below for additional details  Hospital Course:  Assessment and Plan:  Paroxysmal atrial fibrillation/flutter with RVR: s/p TEE/DCCV 12/18, reverted NSR > AFib 12/20. Does not tolerate atrial fibrillation well. Options limited with hypotension.  - Atrial flutter is persistent despite ongoing amiodarone and she seems to have persistent symptoms attributable to this.  - s/p DCCV 12/27 - Cardiology assistance appreciated. Continue amiodarone with oral loading, '400mg'$  BID, maintenance dose started on 12/27 '200mg'$  daily. prn digoxin, not currently required.  - Goal potassium at least 4 and magnesium at least 2. Recheck labs in AM. - Continue Eliquis - plan for repeat echo in January if she maintains sinus rhythm, can be done in inpatient rehab or as outpatient    Delirium  Concern for Cognitive Deficit: Improving. Ammonia noted to be 36 (ULN is 35). No asterixis on exam, no other hepatic dysfunction or lethargy.  - Continue delirium precautions. Mobilize as much as possible, may go into hallway in [facility].  - Tx possible UTI as below.  - MMSE 19/30 12/28 with SLP - neurology f/u outpatient    Chest Discomfort EKG with sinus rhythm Suspect this  was GI etiology, occurred after she ate Given GI cocktail Low suspicion for ACS, no need for  troponins.  If recurrent or more concerning sx, will order. Resolved  Tremor - seems new, more left sided - seems improved now - ? 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  - Continue anticoagulation, statin.  - Continue PT/OT/SLP. Updated rec's needed to restart insurance authorization process.   - New stroke ruled out: Initial concern after brief unresponsive episode with right insular hypodensity on CT 12/21, no acute findings on subsequent MRI/MRA (progression of R MCA territory infarct compared to prior brian MRI, subacute to chronic). No seizure seen on EEG. Mentation and neurological status is back to her recent baseline. No syncope.  - Avoid hypotension - follow with neurology outpatient   Dysphagia - will ask SLP to reeval, concerns regarding swallowing per patient -> no witnessed concerns 12/28 per discussion with SLP - dysphagia 3, thin liquid   Enterococcus UTI:  - amoxicillin per discussion with family and culture results (enterococcus) - s/p 7 days   Orthostatic Hypotension  Hypotension: - midodrine decreased to 5 mg BID in setting of HTN per cards - Continue TED hose/abdominal binder.  - follow orthostatics daily -> see 12/30 therapy note, had some orthostasis with "static standing" "symptomatically... pt tolerates amb better than static standing so will continue to mobilize and work towards incr activity tolerance and mobility".  Worked with mobility tech and did well without Blanchard 12/31.  Follow in CIR, adjust midodrine as needed.   HFrEF, tachycardia-mediated cardiomyopathy, severe MR: LVEF 40-45% with global hypokinesis. TEE in setting of AFib with RVR noted interval worsening LVEF to 20%.  - Monitor I/O, weights, BMP.  - Consider ischemic  evaluation eventually.  follow with cardiology outpatient   Vitamin B12 deficiency: B12 low normal. MMA elevated. - Continue B12 supplementation - follow outpatient   COPD: Patient without acute exacerbation.   Hypothyroidism: TSH 11.301.   - Continue levothyroxine (increased dose) - repeat labs in [name] few weeks (2-4 weeks)     Procedures: 12/27 Plan: Successful DCCV with Leslie [name] single 150J shock to NSR.   12/20 Echo IMPRESSIONS     1. Limited study with only 45 images, technically difficult   2. Left ventricular ejection fraction, by estimation, is 40 to 45%. The  left ventricle has mildly decreased function. The left ventricle  demonstrates global hypokinesis. There is mild asymmetric left ventricular  hypertrophy. Left ventricular diastolic  function could not be evaluated.   3. The mitral valve is abnormal. Mild to moderate mitral valve  regurgitation.   4. Rhythm strip during this exam demonstrates atrial [name] with rapid  ventricular response.   5. The aortic valve is tricuspid. Aortic valve regurgitation is not  visualized.   6. Right ventricular systolic function was not well visualized. The right  ventricular size is not well visualized.   Comparison(s): Changes from prior study are noted. 10/30/2022: LVEF  40-45%, modearate to severe MR.   Conclusion(s)/Recommendation(s): Consider repeat limited echo when the HR  is lower.    12/18 IMPRESSIONS     1. Left ventricular ejection fraction, by estimation, is 20%. The left  ventricle has severely decreased function.   2. Right ventricular systolic function is severely reduced. The right  ventricular size is  mildly enlarged.   3. Left atrial size was moderately dilated. No left atrial/left atrial  appendage thrombus was detected. The LAA emptying velocity was 35 cm/s.   4. Kamisha Ell small pericardial effusion is present.   5. The mitral valve is grossly normal. Severe mitral valve regurgitation,  likely secondary to LV  dysfunction and atrial functional MR. Multiple  jets, appears commissural.   6. The aortic valve is grossly normal. Aortic valve regurgitation is  mild.   7. There is mild (Grade II) atheroma plaque involving the aortic arch and  descending aorta.   Conclusion(s)/Recommendation(s): No LA/LAA thrombus identified. Successful  cardioversion performed with restoration of normal sinus rhythm.    11/21 IMPRESSIONS     1. Left ventricular ejection fraction, by estimation, is 40 to 45%. The  left ventricle has mildly decreased function. The left ventricle  demonstrates global hypokinesis. There is moderate asymmetric left  ventricular hypertrophy of the basal-septal  segment. Left ventricular diastolic parameters are indeterminate.   2. Right ventricular systolic function is normal. The right ventricular  size is normal. There is normal pulmonary artery systolic pressure.   3. Left atrial size was mildly dilated.   4. The mitral valve is abnormal. Moderate to severe mitral valve  regurgitation. No evidence of mitral stenosis.   5. The aortic valve was not well visualized. Aortic valve regurgitation  is trivial. No aortic stenosis is present.   6. The inferior vena cava is dilated in size with >50% respiratory  variability, suggesting right atrial pressure of 8 mmHg.   Comparison(s): MR is worse from prior report.   Consultations: Cardiology Palliative care  Discharge Exam: Vitals:   12/10/22 0444 12/10/22 0931  BP: (!) 147/92 111/69  Pulse: 60 75  Resp: 16 16  Temp: 97.7 F (36.5 C) 97.7 F (36.5 C)  SpO2: 99% 97%   No complaints  General: No acute distress. Cardiovascular: RRR, sinus on tele Lungs: unlabored Neurological: Alert. L facial droop. Extremities: No clubbing or cyanosis. No edema.   Discharge Instructions   Discharge Instructions     Call MD for:  difficulty breathing, headache or visual disturbances   Complete by: As directed    Call MD for:  extreme  fatigue   Complete by: As directed    Call MD for:  hives   Complete by: As directed    Call MD for:  persistant dizziness or light-headedness   Complete by: As directed    Call MD for:  persistant nausea and vomiting   Complete by: As directed    Call MD for:  redness, tenderness, or signs of infection (pain, swelling, redness, odor or green/yellow discharge around incision site)   Complete by: As directed    Call MD for:  severe uncontrolled pain   Complete by: As directed    Call MD for:  temperature >100.4   Complete by: As directed    Diet - low sodium heart healthy   Complete by: As directed    Discharge instructions   Complete by: As directed    You were readmitted with orthostasis and atrial fibrillation with rapid heart rate after your rehab stay.  Cardiology has since cardioverted you and your heart rate has remained in Stephanye Finnicum normal rhythm.  You'll continue on amiodarone for now and follow up with cardiology outpatient to determine whether any changes are needed to this regimen.  Continue your eliquis for stroke prevention.  You should have Juliocesar Blasius repeat echocardiogram in January if you maintain  sinus rhythm.  This can happen while you're in inpatient rehab.  You've had significant drops in blood pressure when standing limiting your ability to work with therapy.  You've gradually improved with correction of the fast heart rate, compression stockings, abdominal binder, and midodrine.  The rehab doctors can continue to titrate the midodrine as needed.  For your stroke, you'll need to continue to follow outpatient with the stroke doctors.  You had Trang Bouse mild tremor noted during the hospitalization.  This seems to have improved, but if it recurs or is persistent, please discuss this with neurology in follow up.  You have had issues with delirium (confusion).  This was related to the prolonged hospitalization, your stroke, your UTI (we treated), etc.  Delirium precautions are important going  forward (frequent reorientation, normalization of sleep wake cycle, TV off at night, blinds open in daytime, glasses readily available, etc.).  There's some concern for Jecenia Leamer developing cognitive deficit which is likely related to your stroke.  Please follow with neurology outpatient.  We changed your thyroid dose.  Repeat labs in 2-4 weeks to see if the synthroid needs further adjustment.  You have b12 deficiency, follow outpatient with your PCP.  Return for new, recurrent, or worsening symptoms.  Please ask your PCP to request records from this hospitalization so they know what was done and what the next steps will be.   Discharge wound care:   Complete by: As directed    LEAVE FOAM DRESSING OFF, it is trapping moisture against the skin. Apply Gerhardts butt cream to buttocks bid and prn turning and cleaning; apply antifungal powder on top   Increase activity slowly   Complete by: As directed       Allergies as of 12/10/2022       Reactions   Duloxetine Nausea Only   Voltaren [diclofenac Sodium] Shortness Of Breath, Palpitations   Lisinopril Other (See Comments)   Aspirin Other (See Comments)   ulcers   Diphenhydramine Other (See Comments)   Gabapentin Itching   Metoprolol    Cold sweat/exhaustion    Ultram [tramadol Hcl] Other (See Comments)   Effects cognitive abilities    Proton Pump Inhibitors Rash   Itching, rash        Medication List     STOP taking these medications    diltiazem 360 MG 24 hr capsule Commonly known as: CARDIZEM CD   spironolactone 25 MG tablet Commonly known as: ALDACTONE       TAKE these medications    acetaminophen 325 MG tablet Commonly known as: TYLENOL Take 1-2 tablets (325-650 mg total) by mouth every 4 (four) hours as needed for mild pain.   amiodarone 200 MG tablet Commonly known as: PACERONE Take 1 tablet (200 mg total) by mouth daily. Start taking on: December 11, 2022   apixaban 5 MG Tabs tablet Commonly known as:  ELIQUIS Take 1 tablet (5 mg total) by mouth 2 (two) times daily.   cyanocobalamin 1000 MCG tablet Take 1 tablet (1,000 mcg total) by mouth daily. Start taking on: December 11, 2022   famotidine 20 MG tablet Commonly known as: PEPCID Take 20 mg by mouth daily as needed for indigestion or heartburn.   levothyroxine 100 MCG tablet Commonly known as: SYNTHROID Take 1 tablet (100 mcg total) by mouth daily at 6 (six) AM. Start taking on: December 11, 2022 What changed:  medication strength how much to take when to take this   midodrine 5 MG tablet Commonly known as:  PROAMATINE Take 1 tablet (5 mg total) by mouth 3 (three) times daily with meals.   mouth rinse Liqd solution 15 mLs by Mouth Rinse route 4 (four) times daily - after meals and at bedtime.   rosuvastatin 10 MG tablet Commonly known as: CRESTOR Take 1 tablet (10 mg total) by mouth daily.               Discharge Care Instructions  (From admission, onward)           Start     Ordered   12/10/22 0000  Discharge wound care:       Comments: LEAVE FOAM DRESSING OFF, it is trapping moisture against the skin. Apply Gerhardts butt cream to buttocks bid and prn turning and cleaning; apply antifungal powder on top   12/10/22 1304           Allergies  Allergen Reactions   Duloxetine Nausea Only   Voltaren [Diclofenac Sodium] Shortness Of Breath and Palpitations   Lisinopril Other (See Comments)   Aspirin Other (See Comments)    ulcers   Diphenhydramine Other (See Comments)   Gabapentin Itching   Metoprolol     Cold sweat/exhaustion    Ultram [Tramadol Hcl] Other (See Comments)    Effects cognitive abilities    Proton Pump Inhibitors Rash    Itching, rash      The results of significant diagnostics from this hospitalization (including imaging, microbiology, ancillary and laboratory) are listed below for reference.    Significant Diagnostic Studies: DG CHEST PORT 1 VIEW  Result Date:  12/06/2022 CLINICAL DATA:  Shortness of breath. EXAM: PORTABLE CHEST 1 VIEW COMPARISON:  Chest x-ray dated November 29, 2022. FINDINGS: The heart size and mediastinal contours are within normal limits. Normal pulmonary vascularity. Mild irregular opacities at the right lung base are unchanged and may reflect scarring after recent pneumonia. No focal consolidation, pleural effusion, or pneumothorax. No acute osseous abnormality. IMPRESSION: 1. No active disease. Electronically Signed   By: Titus Dubin [name]   On: 12/06/2022 11:11   EEG adult  Result Date: 11/29/2022 [name], MD     11/29/2022 12:18 PM Patient Name: Pauletta Pickney MRN: [id number] Epilepsy Attending: [name] Referring Physician/Provider: Shela Leff, MD Date: 11/29/2022 Duration: 24.58 mins Patient history: 82 year old female with an episode of decreased responsiveness.  EEG to evaluate for seizure. Level of alertness: Awake, drowsy AEDs during EEG study: None Technical aspects: This EEG study was done with scalp electrodes positioned according to the 10-20 International system of electrode placement. Electrical activity was reviewed with band pass filter of 1-'70Hz'$ , sensitivity of 7 uV/mm, display speed of 50m/sec with Wilgus Deyton '60Hz'$  notched filter applied as appropriate. EEG data were recorded continuously and digitally stored.  Video monitoring was available and reviewed as appropriate. Description: The posterior dominant rhythm consists of 9 Hz activity of moderate voltage (25-35 uV) seen predominantly in posterior head regions, symmetric and reactive to eye opening and eye closing. Drowsiness was characterized by attenuation of the posterior background rhythm. EEG showed continuous 3 to 5 Hz theta-delta slowing in left fronto-temporal region. Hyperventilation and photic stimulation were not performed.   ABNORMALITY - Continuous slow, left fronto-temporal region IMPRESSION: This study is suggestive of cortical  dysfunction arising from left fronto-temporal region likely secondary to underlying stroke. No seizures or epileptiform discharges were seen throughout the recording. PLora Havens  MR BRAIN WO CONTRAST  Result Date: 11/29/2022 CLINICAL DATA:  Stroke follow-up EXAM: MRI HEAD  WITHOUT CONTRAST MRA HEAD WITHOUT CONTRAST TECHNIQUE: Multiplanar, multi-echo pulse sequences of the brain and surrounding structures were acquired without intravenous contrast. Angiographic images of the Circle of Willis were acquired using MRA technique without intravenous contrast. COMPARISON:  Head CT from earlier today FINDINGS: MRI HEAD FINDINGS Brain: Interval but late subacute to chronic infarct at the right insula, external capsule, and putamen. Evolution of infarct at the anterior right temporal cortex that was acute on prior. Advanced chronic small vessel ischemia in the cerebral white matter. Generalized brain atrophy with ventriculomegaly. Vascular: See below Skull and upper cervical spine: Normal marrow signal Sinuses/Orbits: No acute finding MRA HEAD FINDINGS Anterior circulation: M1 occlusion on prior imaging. No recurrent occlusion or flow limiting stenosis. Mild atheromatous of intracranial branches accentuated by motion. Typical skull base artifact at the carotids. Posterior circulation: Vertebral and basilar arteries are smoothly contoured and widely patent. No branch occlusion, beading, or aneurysm. IMPRESSION: Brain MRI: 1. No acute finding. 2. Progression of right MCA territory infarct compared to prior brain MRI but subacute to chronic. 3. Prominent atrophy and chronic small vessel ischemia. MRA: No recurrent occlusion or proximal flow limiting stenosis. Electronically Signed   By: Jorje Guild [name]   On: 11/29/2022 07:29   MR ANGIO HEAD WO CONTRAST  Result Date: 11/29/2022 CLINICAL DATA:  Stroke follow-up EXAM: MRI HEAD WITHOUT CONTRAST MRA HEAD WITHOUT CONTRAST TECHNIQUE: Multiplanar, multi-echo pulse  sequences of the brain and surrounding structures were acquired without intravenous contrast. Angiographic images of the Circle of Willis were acquired using MRA technique without intravenous contrast. COMPARISON:  Head CT from earlier today FINDINGS: MRI HEAD FINDINGS Brain: Interval but late subacute to chronic infarct at the right insula, external capsule, and putamen. Evolution of infarct at the anterior right temporal cortex that was acute on prior. Advanced chronic small vessel ischemia in the cerebral white matter. Generalized brain atrophy with ventriculomegaly. Vascular: See below Skull and upper cervical spine: Normal marrow signal Sinuses/Orbits: No acute finding MRA HEAD FINDINGS Anterior circulation: M1 occlusion on prior imaging. No recurrent occlusion or flow limiting stenosis. Mild atheromatous of intracranial branches accentuated by motion. Typical skull base artifact at the carotids. Posterior circulation: Vertebral and basilar arteries are smoothly contoured and widely patent. No branch occlusion, beading, or aneurysm. IMPRESSION: Brain MRI: 1. No acute finding. 2. Progression of right MCA territory infarct compared to prior brain MRI but subacute to chronic. 3. Prominent atrophy and chronic small vessel ischemia. MRA: No recurrent occlusion or proximal flow limiting stenosis. Electronically Signed   By: Jorje Guild [name]   On: 11/29/2022 07:29   CT HEAD WO CONTRAST (5MM)  Addendum Date: 11/29/2022   ADDENDUM REPORT: 11/29/2022 02:25 ADDENDUM: Critical Value/emergent results were called by telephone at the time of interpretation on 11/29/2022 at 2:21 am to patient's nurse Caryl Comes who will attempt to contact the ordering physician Dr. Marlowe Sax with results. Dr. Marlowe Sax is not available at this time. Electronically Signed   By: Brett Fairy [name]   On: 11/29/2022 02:25   Result Date: 11/29/2022 CLINICAL DATA:  Syncope/presyncope, cerebrovascular cause suspected. EXAM: CT HEAD WITHOUT  CONTRAST TECHNIQUE: Contiguous axial images were obtained from the base of the skull through the vertex without intravenous contrast. RADIATION DOSE REDUCTION: This exam was performed according to the departmental dose-optimization program which includes automated exposure control, adjustment of the mA and/or kV according to patient size and/or use of iterative reconstruction technique. COMPARISON:  11/03/2022. FINDINGS: Brain: No acute intracranial hemorrhage, midline shift or mass  effect. No extra-axial fluid collection. Diffuse atrophy is noted. Extensive periventricular white matter hypodensities are noted bilaterally. Hypodensity in the temporal lobe anteriorly, compatible with known infarct there is Nicholaos Schippers hypodense region in the insula on the right which is new from the previous exam. No hydrocephalus. Vascular: No hyperdense vessel or unexpected calcification. Skull: Normal. Negative for fracture or focal lesion. Sinuses/Orbits: No acute finding. Other: None. IMPRESSION: 1. New hypodense region in the insula on the right, concerning for acute or subacute infarct. CTA head and/or MRI is recommended for further evaluation. 2. Stable hypodensity in the temporal lobe on the right, compatible with known infarct. 3. Atrophy with extensive chronic microvascular ischemic changes. Electronically Signed: By: Brett Fairy [name] On: 11/29/2022 01:56   DG CHEST PORT 1 VIEW  Result Date: 11/29/2022 CLINICAL DATA:  Syncope. EXAM: PORTABLE CHEST 1 VIEW COMPARISON:  11/27/2022. FINDINGS: The heart size and mediastinal contours are within normal limits. Mild residual airspace disease in the mid to lower right lung field, slightly improved from the prior exam. No effusion or pneumothorax. Degenerative changes in the thoracic spine. IMPRESSION: Mild residual airspace disease in the mid to right lower lung field, slightly improved from the prior exam. Electronically Signed   By: Brett Fairy [name]   On: 11/29/2022 01:00    ECHOCARDIOGRAM COMPLETE  Result Date: 11/28/2022    ECHOCARDIOGRAM REPORT   Patient Name:   RUBI TOOLEY Date of Exam: 11/28/2022 Medical Rec #:  [id number]           Height:       67.5 in Accession #:    [id number]          Weight:       147.0 lb Date of Birth:  [birth year]           BSA:          1.784 m Patient Age:    89 years            BP:           136/88 mmHg Patient Gender: F                   HR:           145 bpm. Exam Location:  Inpatient Procedure: 2D Echo, Cardiac Doppler and Color Doppler Indications:    Congestive Heart Failure I50.9                 Mitral valve insufficiency I34.0  History:        Patient has prior history of Echocardiogram examinations, most                 recent 10/30/2022. CHF, COPD and Stroke, Arrythmias:Atrial                 Fibrillation; Signs/Symptoms:Hypotension. Chronic Kidney                 Disease.  Sonographer:    Ronny Flurry Referring Phys: [id number] [place]  1. Limited study with only 45 images, technically difficult  2. Left ventricular ejection fraction, by estimation, is 40 to 45%. The left ventricle has mildly decreased function. The left ventricle demonstrates global hypokinesis. There is mild asymmetric left ventricular hypertrophy. Left ventricular diastolic function could not be evaluated.  3. The mitral valve is abnormal. Mild to moderate mitral valve regurgitation.  4. Rhythm strip during this exam demonstrates atrial [name] with rapid ventricular response.  5. The aortic  valve is tricuspid. Aortic valve regurgitation is not visualized.  6. Right ventricular systolic function was not well visualized. The right ventricular size is not well visualized. Comparison(s): Changes from prior study are noted. 10/30/2022: LVEF 40-45%, modearate to severe MR. Conclusion(s)/Recommendation(s): Consider repeat limited echo when the HR is lower. FINDINGS  Left Ventricle: Left ventricular ejection fraction, by estimation, is 40 to 45%.  The left ventricle has mildly decreased function. The left ventricle demonstrates global hypokinesis. The left ventricular internal cavity size was normal in size. There is  mild asymmetric left ventricular hypertrophy. Left ventricular diastolic function could not be evaluated due to atrial fibrillation. Left ventricular diastolic function could not be evaluated. Right Ventricle: The right ventricular size is not well visualized. Right vetricular wall thickness was not assessed. Right ventricular systolic function was not well visualized. Left Atrium: Left atrial size was normal in size. Right Atrium: Right atrial size was normal in size. Pericardium: There is no evidence of pericardial effusion. Mitral Valve: The mitral valve is abnormal. Mild to moderate mitral valve regurgitation, with posteriorly-directed jet. Tricuspid Valve: The tricuspid valve is not well visualized. Tricuspid valve regurgitation is not demonstrated. Aortic Valve: The aortic valve is tricuspid. Aortic valve regurgitation is not visualized. Pulmonic Valve: The pulmonic valve was not well visualized. Pulmonic valve regurgitation is not visualized. Aorta: The aortic root and ascending aorta are structurally normal, with no evidence of dilitation. IAS/Shunts: No atrial level shunt detected by color flow Doppler. EKG: Rhythm strip during this exam demonstrates atrial [name] with rapid ventricular response.  LEFT VENTRICLE PLAX 2D LVIDd:         4.00 cm   Diastology LVIDs:         3.20 cm   LV e' lateral: 16.00 cm/s LV PW:         0.90 cm LV IVS:        1.30 cm LVOT diam:     1.90 cm LVOT Area:     2.84 cm  LEFT ATRIUM           Index LA diam:      3.30 cm 1.85 cm/m LA Vol (A4C): 39.9 ml 22.37 ml/m   AORTA Ao Root diam: 2.80 cm Ao Asc diam:  3.10 cm  SHUNTS Systemic Diam: 1.90 cm Lyman Bishop MD Electronically signed by Lyman Bishop MD Signature Date/Time: 11/28/2022/5:07:55 PM    Final    DG CHEST PORT 1 VIEW  Result Date:  11/27/2022 CLINICAL DATA:  Delirium EXAM: PORTABLE CHEST 1 VIEW COMPARISON:  11/20/2022 FINDINGS: Post infectious inflammatory scarring in the right lower lung. Left lung is clear. No pleural effusion or pneumothorax. The heart is normal in size. IMPRESSION: Post infectious inflammatory scarring in the right lower lung. Electronically Signed   By: Julian Hy [name]   On: 11/27/2022 20:05   ECHO TEE  Result Date: 11/26/2022    TRANSESOPHOGEAL ECHO REPORT   Patient Name:   [name] Date of Exam: 11/26/2022 Medical Rec #:  [id number]           Height:       67.5 in Accession #:    [id number]          Weight:       141.6 lb Date of Birth:  05-08-1941           BSA:          1.756 m Patient Age:    76 years  BP:           148/122 mmHg Patient Gender: F                   HR:           148 bpm. Exam Location:  Inpatient Procedure: Transesophageal Echo, Color Doppler and Cardiac Doppler Indications:     Afib  History:         Patient has prior history of Echocardiogram examinations, most                  recent 10/30/2022. Stroke. Chronic kidney disease.  Sonographer:     Darlina Sicilian RDCS Referring Phys:  [id number] Margie Billet Diagnosing Phys: Cherlynn Kaiser MD PROCEDURE: After discussion of the risks and benefits of Aarion Kittrell TEE, an informed consent was obtained from the patient. TEE procedure time was 11 minutes. The transesophogeal probe was passed without difficulty through the esophogus of the patient. Imaged were obtained with the patient in Jagger Demonte left lateral decubitus position. Local oropharyngeal anesthetic was provided with Cetacaine. Sedation performed by different physician. The patient was monitored while under deep sedation. Anesthestetic sedation was provided intravenously by Anesthesiology: 72.'74mg'$  of Propofol, '60mg'$  of Lidocaine. Image quality was good. The patient's vital signs; including heart rate, blood pressure, and oxygen saturation; remained stable throughout the  procedure. The patient developed no complications during the procedure. Exa Bomba successful direct current cardioversion was performed at 120 joules with 1 attempt.  IMPRESSIONS  1. Left ventricular ejection fraction, by estimation, is 20%. The left ventricle has severely decreased function.  2. Right ventricular systolic function is severely reduced. The right ventricular size is mildly enlarged.  3. Left atrial size was moderately dilated. No left atrial/left atrial appendage thrombus was detected. The LAA emptying velocity was 35 cm/s.  4. Alanna Storti small pericardial effusion is present.  5. The mitral valve is grossly normal. Severe mitral valve regurgitation, likely secondary to LV dysfunction and atrial functional MR. Multiple jets, appears commissural.  6. The aortic valve is grossly normal. Aortic valve regurgitation is mild.  7. There is mild (Grade II) atheroma plaque involving the aortic arch and descending aorta. Conclusion(s)/Recommendation(s): No LA/LAA thrombus identified. Successful cardioversion performed with restoration of normal sinus rhythm. FINDINGS  Left Ventricle: Left ventricular ejection fraction, by estimation, is 20%. The left ventricle has severely decreased function. The left ventricular internal cavity size was normal in size. Right Ventricle: The right ventricular size is mildly enlarged. Right vetricular wall thickness was not well visualized. Right ventricular systolic function is severely reduced. Left Atrium: Left atrial size was moderately dilated. No left atrial/left atrial appendage thrombus was detected. The LAA emptying velocity was 35 cm/s. Right Atrium: Right atrial size was normal in size. Pericardium: Celisa Schoenberg small pericardial effusion is present. Mitral Valve: The mitral valve is grossly normal. There is mild thickening of the mitral valve leaflet(s). Severe mitral valve regurgitation. Tricuspid Valve: The tricuspid valve is normal in structure. Tricuspid valve regurgitation is mild. Aortic  Valve: The aortic valve is grossly normal. Aortic valve regurgitation is mild. Pulmonic Valve: The pulmonic valve was normal in structure. Pulmonic valve regurgitation is trivial. Aorta: The aortic root and ascending aorta are structurally normal, with no evidence of dilitation. There is mild (Grade II) atheroma plaque involving the aortic arch and descending aorta. Venous: Katya Rolston systolic blunting flow pattern is recorded from the right upper pulmonary vein and the left upper pulmonary vein. IAS/Shunts: No atrial level shunt detected by color flow  Doppler. Additional Comments: Spectral Doppler performed.  AORTA Ao Root diam: 3.40 cm Ao Asc diam:  3.10 cm MR Peak grad:    41.0 mmHg MR Mean grad:    29.0 mmHg MR Vmax:         320.00 cm/s MR Vmean:        255.0 cm/s MR PISA:         3.08 cm MR PISA Eff ROA: 37 mm MR PISA Radius:  0.70 cm Cherlynn Kaiser MD Electronically signed by Cherlynn Kaiser MD Signature Date/Time: 11/26/2022/3:19:51 PM    Final    DG Chest 2 View  Result Date: 11/20/2022 CLINICAL DATA:  Increased breath sounds in the bases EXAM: CHEST - 2 VIEW COMPARISON:  10/31/2022 FINDINGS: Cardiac shadow is within normal limits. Previously seen vascular congestion and edema has resolved. Some patchy residual density noted over the right base which may represent some scarring. Short-term follow-up to assess for stability is recommended. No focal infiltrate or effusion is noted. No bony abnormality is seen. IMPRESSION: Persistent density in the right lung base likely representing some scarring from previous infiltrate. Continued follow-up is recommended. Electronically Signed   By: Inez Catalina [name]   On: 11/20/2022 19:53    Microbiology: No results found for this or any previous visit (from the past 240 hour(s)).   Labs: Basic Metabolic Panel: Recent Labs  Lab 12/05/22 0358 12/06/22 0341 12/07/22 0452 12/08/22 0144 12/10/22 0147  NA 141 139 140 140 139  K 4.0 4.0 3.7 3.9 3.8  CL 105 104 104  104 101  CO2 '26 26 29 28 27  '$ GLUCOSE 95 82 122* 108* 96  BUN '12 13 14 14 11  '$ CREATININE 1.27* 1.21* 1.30* 1.27* 1.09*  CALCIUM 9.7 9.6 9.4 9.6 9.8  MG 2.1 2.1 2.1 2.2 2.4  PHOS  --  3.4 3.5  --  3.5   Liver Function Tests: Recent Labs  Lab 12/06/22 0341 12/08/22 0144  AST 30 30  ALT 22 22  ALKPHOS 52 54  BILITOT 1.2 1.0  PROT 6.1* 5.9*  ALBUMIN 3.4* 3.4*   No results for input(s): "LIPASE", "AMYLASE" in the last 168 hours. No results for input(s): "AMMONIA" in the last 168 hours. CBC: Recent Labs  Lab 12/05/22 0358 12/06/22 0341 12/07/22 0452 12/08/22 0144 12/10/22 0147  WBC 10.1 11.3* 8.6 10.0 9.7  NEUTROABS  --  7.7  --  5.8  --   HGB 12.4 11.7* 11.4* 11.5* 12.1  HCT 39.2 36.5 35.1* 35.5* 39.1  MCV 87.9 88.2 88.0 87.2 88.5  PLT 232 212 224 231 241   Cardiac Enzymes: No results for input(s): "CKTOTAL", "CKMB", "CKMBINDEX", "TROPONINI" in the last 168 hours. BNP: BNP (last 3 results) Recent Labs    10/31/22 1540 11/01/22 0038  BNP 733.7* 791.8*    ProBNP (last 3 results) No results for input(s): "PROBNP" in the last 8760 hours.  CBG: Recent Labs  Lab 12/07/22 1749  GLUCAP 97       Signed:  Fayrene Helper MD.  [facility] 12/10/2022, 1:04 PM

## 2022-12-11 DIAGNOSIS — I6329 Cerebral infarction due to unspecified occlusion or stenosis of other precerebral arteries: Secondary | ICD-10-CM

## 2022-12-11 LAB — CBC WITH DIFFERENTIAL/PLATELET
Abs Immature Granulocytes: 0.03 10*3/uL (ref 0.00–0.07)
Basophils Absolute: 0.1 10*3/uL (ref 0.0–0.1)
Basophils Relative: 1 %
Eosinophils Absolute: 0.3 10*3/uL (ref 0.0–0.5)
Eosinophils Relative: 4 %
HCT: 39.3 % (ref 36.0–46.0)
Hemoglobin: 12.3 g/dL (ref 12.0–15.0)
Immature Granulocytes: 0 %
Lymphocytes Relative: 17 %
Lymphs Abs: 1.6 10*3/uL (ref 0.7–4.0)
MCH: 28 pg (ref 26.0–34.0)
MCHC: 31.3 g/dL (ref 30.0–36.0)
MCV: 89.3 fL (ref 80.0–100.0)
Monocytes Absolute: 0.9 10*3/uL (ref 0.1–1.0)
Monocytes Relative: 9 %
Neutro Abs: 6.5 10*3/uL (ref 1.7–7.7)
Neutrophils Relative %: 69 %
Platelets: 228 10*3/uL (ref 150–400)
RBC: 4.4 MIL/uL (ref 3.87–5.11)
RDW: 16 % — ABNORMAL HIGH (ref 11.5–15.5)
WBC: 9.5 10*3/uL (ref 4.0–10.5)
nRBC: 0 % (ref 0.0–0.2)

## 2022-12-11 LAB — COMPREHENSIVE METABOLIC PANEL
ALT: 19 U/L (ref 0–44)
AST: 25 U/L (ref 15–41)
Albumin: 3.5 g/dL (ref 3.5–5.0)
Alkaline Phosphatase: 66 U/L (ref 38–126)
Anion gap: 9 (ref 5–15)
BUN: 15 mg/dL (ref 8–23)
CO2: 27 mmol/L (ref 22–32)
Calcium: 9.3 mg/dL (ref 8.9–10.3)
Chloride: 102 mmol/L (ref 98–111)
Creatinine, Ser: 1.3 mg/dL — ABNORMAL HIGH (ref 0.44–1.00)
GFR, Estimated: 42 mL/min — ABNORMAL LOW (ref >60)
Glucose, Bld: 104 mg/dL — ABNORMAL HIGH (ref 70–99)
Potassium: 3.8 mmol/L (ref 3.5–5.1)
Sodium: 138 mmol/L (ref 135–145)
Total Bilirubin: 0.9 mg/dL (ref 0.3–1.2)
Total Protein: 6.3 g/dL — ABNORMAL LOW (ref 6.5–8.1)

## 2022-12-11 MED ORDER — TRAZODONE HCL 50 MG PO TABS
25.0000 mg | ORAL_TABLET | Freq: Every evening | ORAL | Status: DC | PRN
  Administered 2022-12-11 – 2022-12-20 (×9): 25 mg via ORAL
  Filled 2022-12-11 (×11): qty 1

## 2022-12-11 MED ORDER — POLYETHYLENE GLYCOL 3350 17 G PO PACK
17.0000 g | PACK | Freq: Every day | ORAL | Status: DC
  Administered 2022-12-11 – 2022-12-26 (×13): 17 g via ORAL
  Filled 2022-12-11 (×12): qty 1

## 2022-12-11 NOTE — Discharge Instructions (Addendum)
Inpatient Rehab Discharge Instructions  [place] Discharge date and time: No discharge date for patient encounter.   Activities/Precautions/ Functional Status: Activity: activity as tolerated Diet: regular diet Wound Care: Routine skin checks Functional status:  ___ No restrictions     ___ Walk up steps independently ___ 24/7 supervision/assistance   ___ Walk up steps with assistance ___ Intermittent supervision/assistance  ___ Bathe/dress independently ___ Walk with walker     _x__ Bathe/dress with assistance ___ Walk Independently    ___ Shower independently ___ Walk with assistance    ___ Shower with assistance ___ No alcohol     ___ Return to work/school ________   COMMUNITY REFERRALS UPON DISCHARGE:    Home Health:   PT     OT     ST                   Agency: [facility] Phone: [phone]    Medical Equipment/Items Ordered: Bedside Commode and Transport Chair                                                  Agency/Supplier: [phone]  Special Instructions: No driving smoking or alcoholSTROKE/TIA DISCHARGE INSTRUCTIONS SMOKING Cigarette smoking nearly doubles your risk of having a stroke & is the single most alterable risk factor  If you smoke or have smoked in the last 12 months, you are advised to quit smoking for your health. Most of the excess cardiovascular risk related to smoking disappears within a year of stopping. Ask you doctor about anti-smoking medications [place] Quit Line: 1-800-QUIT NOW Free Smoking Cessation Classes [phone]  CHOLESTEROL Know your levels; limit fat & cholesterol in your diet  Lipid Panel     Component Value Date/Time   CHOL 151 10/31/2022 0108   TRIG 61 10/31/2022 0108   HDL 53 10/31/2022 0108   CHOLHDL 2.8 10/31/2022 0108   VLDL 12 10/31/2022 0108   LDLCALC 86 10/31/2022 0108     Many patients benefit from treatment even if their cholesterol is at goal. Goal: Total Cholesterol (CHOL) less than 160 Goal:  Triglycerides  (TRIG) less than 150 Goal:  HDL greater than 40 Goal:  LDL (LDLCALC) less than 100   BLOOD PRESSURE American Stroke Association blood pressure target is less that 120/80 mm/Hg  Your discharge blood pressure is:  BP: 139/77 Monitor your blood pressure Limit your salt and alcohol intake Many individuals will require more than one medication for high blood pressure  DIABETES (A1c is a blood sugar average for last 3 months) Goal HGBA1c is under 7% (HBGA1c is blood sugar average for last 3 months)  Diabetes: No known diagnosis of diabetes    Lab Results  Component Value Date   HGBA1C 6.2 (H) 10/30/2022    Your HGBA1c can be lowered with medications, healthy diet, and exercise. Check your blood sugar as directed by your physician Call your physician if you experience unexplained or low blood sugars.  PHYSICAL ACTIVITY/REHABILITATION Goal is 30 minutes at least 4 days per week  Activity: Increase activity slowly, Therapies: Physical Therapy: Home Health Return to work:  Activity decreases your risk of heart attack and stroke and makes your heart stronger.  It helps control your weight and blood pressure; helps you relax and can improve your mood. Participate in a regular exercise program. Talk with your doctor  about the best form of exercise for you (dancing, walking, swimming, cycling).  DIET/WEIGHT Goal is to maintain a healthy weight  Your discharge diet is:  Diet Order             DIET DYS 3 Room service appropriate? Yes; Fluid consistency: Thin  Diet effective now                   liquids Your height is:  Height: '5\' 7"'$  (170.2 cm) Your current weight is: Weight: 64 kg Your Body Mass Index (BMI) is:  BMI (Calculated): 22.09 Following the type of diet specifically designed for you will help prevent another stroke. Your goal weight range is:   Your goal Body Mass Index (BMI) is 19-24. Healthy food habits can help reduce 3 risk factors for stroke:  High cholesterol, hypertension,  and excess weight.  RESOURCES Stroke/Support Group:  Call [phone]   STROKE EDUCATION PROVIDED/REVIEWED AND GIVEN TO PATIENT Stroke warning signs and symptoms How to activate emergency medical system (call 911). Medications prescribed at discharge. Need for follow-up after discharge. Personal risk factors for stroke. Pneumonia vaccine given: No Flu vaccine given: No My questions have been answered, the writing is legible, and I understand these instructions.  I will adhere to these goals & educational materials that have been provided to me after my discharge from the hospital.      My questions have been answered and I understand these instructions. I will adhere to these goals and the provided educational materials after my discharge from the hospital.  Patient/Caregiver Signature _______________________________ Date __________  Clinician Signature _______________________________________ Date __________  Please bring this form and your medication list with you to all your follow-up doctor's appointments.     Information on my medicine - ELIQUIS (apixaban)  This medication education was reviewed with me or my healthcare representative as part of my discharge preparation.  Why was Eliquis prescribed for you? Eliquis was prescribed for you to reduce the risk of a blood clot forming that can cause a stroke if you have a medical condition called atrial fibrillation (a type of irregular heartbeat).  What do You need to know about Eliquis ? Take your Eliquis TWICE DAILY - one tablet in the morning and one tablet in the evening with or without food. If you have difficulty swallowing the tablet whole please discuss with your pharmacist how to take the medication safely.  Take Eliquis exactly as prescribed by your doctor and DO NOT stop taking Eliquis without talking to the doctor who prescribed the medication.  Stopping may increase your risk of developing a stroke.  Refill your  prescription before you run out.  After discharge, you should have regular check-up appointments with your healthcare provider that is prescribing your Eliquis.  In the future your dose may need to be changed if your kidney function or weight changes by a significant amount or as you get older.  What do you do if you miss a dose? If you miss a dose, take it as soon as you remember on the same day and resume taking twice daily.  Do not take more than one dose of ELIQUIS at the same time to make up a missed dose.  Important Safety Information A possible side effect of Eliquis is bleeding. You should call your healthcare provider right away if you experience any of the following: Bleeding from an injury or your nose that does not stop. Unusual colored urine (red or dark brown) or  unusual colored stools (red or black). Unusual bruising for unknown reasons. A serious fall or if you hit your head (even if there is no bleeding).  Some medicines may interact with Eliquis and might increase your risk of bleeding or clotting while on Eliquis. To help avoid this, consult your healthcare provider or pharmacist prior to using any new prescription or non-prescription medications, including herbals, vitamins, non-steroidal anti-inflammatory drugs (NSAIDs) and supplements.  This website has more information on Eliquis (apixaban): http://[email or web address].com/eliquis/home

## 2022-12-11 NOTE — Evaluation (Signed)
Occupational Therapy Assessment and Plan  Patient Details  Name: Leslie [name] MRN: [id number] Date of Birth: 10/25/1941  OT Diagnosis: cognitive deficits, hemiplegia affecting non-dominant side, and muscle weakness (generalized) Rehab Potential: Rehab Potential (ACUTE ONLY): Good ELOS: 2 weeks   Today's Date: 12/11/2022 OT Individual Time: 0900-1015 OT Individual Time Calculation (min): 75 min     Hospital Problem: Principal Problem:   Cerebrovascular accident (CVA) due to occlusion of right posterior communicating artery ([place])   Past Medical History:  Past Medical History:  Diagnosis Date   Arthritis    Back pain due to injury    fell off a ladder and fractured back-T12 down (multiple surgeries w/ onging pain)   Chronic kidney disease    "stage 3 kidney failure" improving per pt   Hypothyroidism    Insomnia    Plantar fasciitis    Stomach ulcer ~2000   Stroke ([place])    TIA X2   2001, jan 2017   Vertigo, benign paroxysmal, bilateral    Vitamin D deficiency    Past Surgical History:  Past Surgical History:  Procedure Laterality Date   ABDOMINAL HYSTERECTOMY  1974   ANKLE FUSION Right 06/07/2016   Procedure: RIGHT TALONAVICULAR ARTHRODESIS ;  Surgeon: Wylene Simmer, MD;  Location: [facility];  Service: Orthopedics;  Laterality: Right;   APPENDECTOMY     BACK SURGERY     x4  fusion T12 thru sacrum; U rod in sacrum   BUBBLE STUDY  11/26/2022   Procedure: BUBBLE STUDY;  Surgeon: Elouise Munroe, MD;  Location: [place];  Service: Cardiovascular;;   CARDIOVERSION N/A 11/26/2022   Procedure: CARDIOVERSION;  Surgeon: Elouise Munroe, MD;  Location: [facility] ENDOSCOPY;  Service: Cardiovascular;  Laterality: N/A;   CARDIOVERSION N/A 12/05/2022   Procedure: CARDIOVERSION;  Surgeon: Pixie Casino, MD;  Location: [facility] ENDOSCOPY;  Service: Cardiovascular;  Laterality: N/A;   CATARACT EXTRACTION, BILATERAL Bilateral 2015   GASTROCNEMIUS RECESSION Right  06/07/2016   Procedure: RIGHT GASTROC RECESSION ;  Surgeon: Wylene Simmer, MD;  Location: [place];  Service: Orthopedics;  Laterality: Right;   HAND SURGERY Left 2003   x2   IR ANGIO EXTRACRAN SEL COM CAROTID INNOMINATE UNI L MOD SED  11/03/2022   IR CT HEAD LTD  11/03/2022   IR PERCUTANEOUS ART THROMBECTOMY/INFUSION INTRACRANIAL INC DIAG ANGIO  11/03/2022   L heel surgery for torn tendon Left 2015   PLANTAR FASCIA SURGERY Right 2009   surgery at [place] 11/03/2022   Procedure: IR WITH ANESTHESIA;  Surgeon: Radiologist, Medication, MD;  Location: [place];  Service: Radiology;  Laterality: N/A;   SHOULDER ARTHROSCOPY Right 2007   TEE WITHOUT CARDIOVERSION N/A 11/26/2022   Procedure: TRANSESOPHAGEAL ECHOCARDIOGRAM (TEE);  Surgeon: Elouise Munroe, MD;  Location: [facility]OSCOPY;  Service: Cardiovascular;  Laterality: N/A;   TENDON REPAIR Right 1949   foot- lacerated tendon in arch of foot   THORACENTESIS N/A 10/30/2022   Procedure: THORACENTESIS;  Surgeon: Julian Hy, DO;  Location: [place];  Service: Cardiopulmonary;  Laterality: N/A;   TOTAL HIP ARTHROPLASTY Left 10/05/2021   Procedure: TOTAL HIP ARTHROPLASTY ANTERIOR APPROACH;  Surgeon: Rod Can, MD;  Location: [facility];  Service: Orthopedics;  Laterality: Left;    Assessment & Plan Clinical Impression: Leslie [name] is a 82 year old right-handed female with history of prior CVA, CKD GERD recent bouts of cough treated with antibiotics and steroids was admitted with reports of 2-3  weeks of palpitations, seen by PCP for lightheadedness and dizziness found to have A-fib with RVR and sent to [facility] 10/29/2022 for evaluation.  She was started on intravenous Cardizem and heparin.  CT chest done due to episodes of chest pain and reports of cough revealed right layering pleural effusion and right upper lobe pneumonia.  She was started on ceftriaxone and Zithromax for CAP and  underwent thoracentesis of 500 cc parapneumonic effusion by PCCM Dr. Martinique consulted for management of A-fib and felt that new onset likely due to pneumonia.  Echocardiogram with ejection fraction of 40 to 45% with LV global hypokinesis with moderate asymmetric left LVH moderate to severe MVR and dilated inferior vena cava.  She was started on metoprolol but reported shortness of breath side effects therefore was discontinued Cardizem was titrated.  She was transition to Eliquis.  On 11/25 patient had sudden onset of right gaze deviation with left hemiparesis and neglect.  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  Neurology felt stroke was embolic due to A-fib with RVR even on Eliquis.  Follow-up MRI brain done revealing small acute infarct right frontal temporal lobe and caudate body with advanced chronic small vessel disease.  Dr. Curt Bears following for ongoing issues with A-flutter converted with IV amiodarone 11/25 and Cardizem titrated but heart rates continued to run 120s at rest.  Modified barium swallow done revealing moderate spillage with buccal residual and delay in swallow with occasional trace aspiration.  She was placed on a dysphagia #3 diet.  Patient was admitted to inpatient rehab services 11/08/2022 with bouts of orthostasis A-fib heart rate in the 160s.  On 11/23/2022 she was started on fluid bolus due to hypotension.  Dr. Percival Spanish was consulted recommending intravenous amiodarone bolus for rate control.  Dr. Lorrine Kin consulted for hospitalist and assistance to discharge to acute care services as patient would have to remain on telemetry.  Patient is stabilized cardiac rate controlled she remains on Eliquis as well as amiodarone 200 mg daily.  Hospital course complicated by Enterococcus UTI completing 7-day course of antibiotics.  Patient transferred to CIR on 12/10/2022  .    Patient currently requires min-mod with basic self-care skills and IADL secondary to muscle weakness, decreased cardiorespiratoy endurance, motor apraxia, decreased coordination, and decreased motor planning, decreased attention to left, left side neglect, and decreased motor planning, decreased initiation, decreased attention, decreased awareness, decreased problem solving, decreased safety awareness, decreased memory, and delayed processing, central origin, and decreased sitting balance, decreased standing balance, hemiplegia, and decreased balance strategies.  Prior to hospitalization, patient could complete BADLs and IADLs with independent .  Patient will benefit from skilled intervention to decrease level of assist with basic self-care skills, increase independence with basic self-care skills, and increase level of independence with iADL prior to discharge home with care partner.  Anticipate patient will require 24 hour supervision and  follow up services to be determined .  OT - End of Session Activity Tolerance: Decreased this session Endurance Deficit: Yes OT Assessment Rehab Potential (ACUTE ONLY): Good OT Patient demonstrates impairments in the following area(s): Balance;Endurance;Perception;Behavior;Motor;Safety;Cognition;Nutrition;Pain;Skin Integrity;Sensory OT Basic ADL's Functional Problem(s): Grooming;Bathing;Dressing;Toileting OT Transfers Functional Problem(s): Toilet;Tub/Shower OT Plan OT Intensity: Minimum of 1-2 x/day, 45 to 90 minutes OT Frequency: 5 out of 7 days OT Duration/Estimated Length of Stay: 2 weeks OT Treatment/Interventions: Balance/vestibular training;Community reintegration;Disease mangement/prevention;Neuromuscular re-education;Patient/family education;Self Care/advanced ADL retraining;Therapeutic Exercise;UE/LE Coordination activities;Wheelchair propulsion/positioning;Cognitive  remediation/compensation;Discharge planning;DME/adaptive equipment  instruction;Functional mobility training;Pain management;Psychosocial support;Skin care/wound managment;Therapeutic Activities;Visual/perceptual remediation/compensation;UE/LE Strength taining/ROM OT Self Feeding Anticipated Outcome(s): Independent OT Basic Self-Care Anticipated Outcome(s): Supervision OT Toileting Anticipated Outcome(s): Min A OT Bathroom Transfers Anticipated Outcome(s): CGA wiht LRAD OT Recommendation Patient destination: Home (d/c destination to be determined based on improvement in Pt fucntional status and availability of family support at d/c) Follow Up Recommendations: Other (comment) (follow up services to be determined) Equipment Recommended: To be determined (to be determined based on d/c location; may need tub bench and 3 in 1 BSC if d/c'ing home)   OT Evaluation OT Skilled Intervention/Progress Updates:  1:1 OT evaluation and intervention initiated with education provided on OT role, goals, and POC. Pt received reclined in bed presenting to be anxious and somewhat confused. Pt reporting unrated abdominal pain d/t constipation during session. OT oriented Pt and provided therapeutic use of self. Pt reported she remembered OT from previous IPR stay. Pt receptive to skilled OT session. Pt urgently requesting to use restroom d/t need to have BM. OT donned TED hose on Pt in supine. Pt transitioned supine>EOB CGA with HOB elevated. Pt reported nausea upon sitting and spontaneously returning to supine. OT provided therapeutic support and encouragement allowing Pt increased time to rest. Pt transitioned to EOB CGA and maintained sitting balance SBA while OT attempted to gather BP, but unable d/t equipment malfunction. Pt sit>stand>stand pivot>BSC using RW min A with mod verbal cues for safety. Pt able to remove brief in standing with min A for balance. Upon sitting on BSC, Pt had continent void, but unable to have BM despite being provided increased time. Pt spontaneously reaching  for bed and attempting to transfer while sitting on BSC with mod verbal cues required to redirect Pt to sitting on BSC. Pt upset she was unable to have a BM and perseverating for the rest of the session despite max verbal cues for redirection provided. Pt squat pivot back to bed using RW MIN A. Lateral scoots to [place] A. Pt able to roll R/L to donn clean brief CGA and bridge hips x2 min A. Pt able to pull self up in bed using bed features min A. Pt unable to be redirected and attempting to call nurse multiple times during session with OT speaking with nurse x2 and educating Pt that she had already received stool softener/prune juice. Pt stating she is "impacted" and needs gloves to un-impact her BM. Pt reaching her hand into her brief and attempting to dig out her BM and unable to be redirected. Pt max A to clean up hands and min A to donn clean gown. Rn notified of Pt status- entering room upon OT departure. Pt was left resting in bed with call bell in reach, bed alarm on, and all immediate needs met.   Precautions/Restrictions  Precautions Precautions: Fall;Other (comment) Precaution Comments: orthostatic, TEDs and ABD binder Restrictions Weight Bearing Restrictions: No Other Position/Activity Restrictions: TED hose, abdominal binder, ace wraps General Chart Reviewed: Yes Additional Pertinent History: Vertigo PT Missed Treatment Reason: Patient fatigue Family/Caregiver Present: No Vital Signs Therapy Vitals Temp: 98.3 F (36.8 C) Temp Source: Oral Pulse Rate: 79 Resp: 16 BP: 110/63 Patient Position (if appropriate): Sitting Oxygen Therapy SpO2: 99 % O2 Device: Room Air Pain   Home Living/Prior Functioning Home Living Family/patient expects to be discharged to:: Private residence Living Arrangements: Alone Available Help at Discharge: Family Type of Home: House Home Access: Stairs to enter [employer] of Steps: 2 Entrance Stairs-Rails:  Right, Left Home Layout: Two  level, Able to live on main level with bedroom/bathroom Alternate Level Stairs-Number of Steps: 4 steps down to main living room w/ rail Bathroom Shower/Tub: Multimedia programmer: Standard Bathroom Accessibility: Yes Additional Comments: Daughter to arrange 24/7 supervision; she is here with her spouse from [place] With: [place] Responsibilities: Yes Meal Prep Responsibility: Primary Laundry Responsibility: Primary Cleaning Responsibility: Primary Bill Paying/Finance Responsibility: Primary Shopping Responsibility: Primary Child Care Responsibility: No Current License: Yes Mode of Transportation: Car Education: High school Occupation: Retired Prior Function Level of Independence: Independent with homemaking with ambulation, Requires assistive device for independence, Independent with gait, Independent with basic ADLs  Able to [place]?: Yes Driving: Yes Vocation: Retired Surveyor, mining Baseline Vision/History: 1 Wears glasses Ability to See in Adequate Light: 1 Impaired Patient Visual Report: No change from baseline Vision Assessment?: Vision impaired- to be further tested in functional context Alignment/Gaze Preference: Gaze right Tracking/Visual Pursuits: Right eye does not track laterally;Left eye does not track laterally;Decreased smoothness of horizontal tracking;Impaired - to be further tested in functional context;Requires cues, head turns, or add eye shifts to track Saccades: Impaired - to be further tested in functional context Convergence: Impaired - to be further tested in functional context Depth Perception: Undershoots Additional Comments: L innatention Perception  Perception: Impaired Inattention/Neglect: Does not attend to left visual field;Does not attend to left side of body Praxis Praxis: Impaired Praxis Impairment Details: Motor planning;Perseveration;Initiation Praxis-Other Comments: perseverative L UE  grasp Cognition Cognition Overall Cognitive Status: Impaired/Different from baseline Arousal/Alertness: Awake/alert Orientation Level: Person;Place;Situation Person: Oriented Place: Oriented Situation: Oriented (somewhat oriented; Pt able to report she had a stroke) Memory: Impaired Memory Impairment: Storage deficit;Retrieval deficit;Decreased recall of new information;Decreased short term memory Decreased Short Term Memory: Verbal basic;Functional basic Attention: Focused;Sustained Focused Attention: Appears intact Sustained Attention: Impaired Sustained Attention Impairment: Verbal basic;Functional basic Selective Attention: Impaired Selective Attention Impairment: Verbal basic;Functional basic Awareness: Impaired Awareness Impairment: Intellectual impairment;Emergent impairment Problem Solving: Impaired Problem Solving Impairment: Functional basic;Verbal basic Executive Function:  (lower level deficits impacting all higher level exercitive functions) Reasoning: Impaired Reasoning Impairment: Verbal basic;Functional basic Sequencing: Impaired Sequencing Impairment: Verbal basic;Functional basic Organizing: Impaired Organizing Impairment: Verbal basic;Functional basic Decision Making: Impaired Decision Making Impairment: Verbal basic;Functional basic Initiating: Impaired Initiating Impairment: Verbal basic;Functional basic Self Monitoring: Impaired Self Monitoring Impairment: Verbal basic;Functional basic Self Correcting: Impaired Self Correcting Impairment: Verbal basic;Functional basic Behaviors: Restless;Perseveration;Impulsive Safety/Judgment: Impaired Brief Interview for Mental Status (BIMS) Repetition of Three Words (First Attempt): 3 Temporal Orientation: Year: Correct Temporal Orientation: Month: Accurate within 5 days Temporal Orientation: Day: Incorrect Recall: "Sock": No, could not recall Recall: "Blue": Yes, no cue required Recall: "Bed": Yes, no cue  required BIMS Summary Score: 12 Sensation Sensation Light Touch: Impaired Detail Central sensation comments: difficult to assess during session; Pt agitated and fixated on need to use restroom Light Touch Impaired Details: Impaired LUE Hot/Cold: Not tested Proprioception: Impaired Detail Proprioception Impaired Details: Impaired LLE Stereognosis: Not tested Additional Comments: dmininished in LUE compaired to RUE; unable to follow directions to keep eyes closed during assessment; to be further assessed in functional context Coordination Gross Motor Movements are Fluid and Coordinated: No Fine Motor Movements are Fluid and Coordinated: No Coordination and Movement Description: FM movements impaired d/t L hemiparesis, L inattention, and impaired motor planning Finger Nose Finger Test: undrshooting with L>R, dysmetria noted in LUE Motor  Motor Motor: Other (comment);Motor perseverations Motor - Skilled Clinical Observations: mild L hemipareisis, with decreased  L attention, and impaired awareness  Trunk/Postural Assessment  Cervical Assessment Cervical Assessment: Exceptions to [facility] (slight forward head) Thoracic Assessment Thoracic Assessment: Exceptions to [facility] (mild rounding of shoulders) Lumbar Assessment Lumbar Assessment: Exceptions to [facility] Postural Control Postural Control: Deficits on evaluation Protective Responses: delayed, to be further assessed in fucntional context Postural Limitations: decreased  Balance Balance Balance Assessed: Yes Static Sitting Balance Static Sitting - Balance Support: Feet supported Static Sitting - Level of Assistance: 5: Stand by assistance Dynamic Sitting Balance Dynamic Sitting - Balance Support: Feet supported Dynamic Sitting - Level of Assistance: 5: Stand by assistance Dynamic Sitting - Balance Activities: Reaching for objects;Lateral lean/weight shifting;Reaching across midline;Forward lean/weight shifting Sitting balance - Comments:  required max motivation to remain seated d/t perseverating on abdominal pain Static Standing Balance Static Standing - Balance Support: Bilateral upper extremity supported Static Standing - Level of Assistance: 4: Min assist Dynamic Standing Balance Dynamic Standing - Balance Support: During functional activity;Bilateral upper extremity supported Dynamic Standing - Level of Assistance: 4: Min assist Extremity/Trunk Assessment RUE Assessment RUE Assessment: Within Functional Limits General Strength Comments: 4+/5 strength LUE Assessment LUE Assessment: Within Functional Limits General Strength Comments: 4+/5 strength, increased time required to initate movements; delayed/slow motor movements; to be further assessed during fucntional context  Care Tool Care Tool Self Care Eating   Eating Assist Level: Supervision/Verbal cueing    Oral Care    Oral Care Assist Level: Moderate Assistance - Patient 50 - 74%    Bathing   Body parts bathed by patient: Right arm;Left arm;Chest;Abdomen;Front perineal area;Right upper leg;Left upper leg;Face Body parts bathed by helper: Right lower leg;Buttocks;Left lower leg   Assist Level: Moderate Assistance - Patient 50 - 74%    Upper Body Dressing(including orthotics)   What is the patient wearing?: Pull over shirt   Assist Level: Minimal Assistance - Patient > 75%    Lower Body Dressing (excluding footwear)   What is the patient wearing?: Pants Assist for lower body dressing: Moderate Assistance - Patient 50 - 74%    Putting on/Taking off footwear   What is the patient wearing?: [place] for footwear: Total Assistance - Patient < 25%       Care Tool Toileting Toileting activity   Assist for toileting: Moderate Assistance - Patient 50 - 74%     Care Tool Bed Mobility Roll left and right activity   Roll left and right assist level: Contact Guard/Touching assist    Sit to lying activity   Sit to lying assist level: Contact  Guard/Touching assist    Lying to sitting on side of bed activity   Lying to sitting on side of bed assist level: the ability to move from lying on the back to sitting on the side of the bed with no back support.: Contact Guard/Touching assist     Care Tool Transfers Sit to stand transfer   Sit to stand assist level: Minimal Assistance - Patient > 75%    Chair/bed transfer   Chair/bed transfer assist level: Minimal Assistance - Patient > 75%     Toilet transfer   Assist Level: Minimal Assistance - Patient > 75% Assistive Device Comment: RW, [facility]   Care Tool Cognition  Expression of Ideas and Wants Expression of Ideas and Wants: 3. Some difficulty - exhibits some difficulty with expressing needs and ideas (e.g, some words or finishing thoughts) or speech is not clear  Understanding Verbal and Non-Verbal Content Understanding Verbal and Non-Verbal Content: 3. Usually understands -  understands most conversations, but misses some part/intent of message. Requires cues at times to understand   Memory/Recall Ability Memory/Recall Ability : Current season;That he or she is in a hospital/hospital unit;Staff names and faces   Refer to Care Plan for Long Term Goals  SHORT TERM GOAL WEEK 1 OT Short Term Goal 1 (Week 1): Pt will attend to L visual field/side of body during functional activities with min verbal cues OT Short Term Goal 2 (Week 1): Pt will tolerate standing using LRAD during functional activties/BADLs for 3 minutes OT Short Term Goal 3 (Week 1): Pt will complete toilet transfer CGA using LRAD  Recommendations for other services: None    Skilled Therapeutic Intervention 1:1 OT evaluation and intervention initiated with skilled education provided on OT role, goals, and POC.  HR 73 PSO2 99% BP 146/111 HR 84 PSO2 99& BP 90/80 Sitting with TED hose  HR 89 BP 115/82 Vitals taken in sitting  ADL ADL Eating: Supervision/safety Where Assessed-Eating: Bed level Grooming: Moderate  assistance Where Assessed-Grooming: Bed level Upper Body Bathing: Minimal assistance Where Assessed-Upper Body Bathing: Bed level Lower Body Bathing: Moderate assistance Where Assessed-Lower Body Bathing: Bed level Upper Body Dressing: Minimal assistance Where Assessed-Upper Body Dressing: Bed level Lower Body Dressing: Minimal assistance Where Assessed-Lower Body Dressing: Bed level Toileting: Moderate assistance Where Assessed-Toileting: Bedside Commode Toilet Transfer: Minimal assistance Toilet Transfer Method: Stand pivot Toilet Transfer Equipment: Bedside commode Tub/Shower Transfer: Not assessed Social research officer, government: Not assessed Mobility  Bed Mobility Bed Mobility: Supine to Sit;Sit to Supine Supine to Sit: Contact Guard/Touching assist;Minimal Assistance - Patient > 75% Sit to Supine: Contact Guard/Touching assist Transfers Sit to Stand: Minimal Assistance - Patient > 75% Stand to Sit: Minimal Assistance - Patient > 75%   Discharge Criteria: Patient will be discharged from OT if patient refuses treatment 3 consecutive times without medical reason, if treatment goals not met, if there is a change in medical status, if patient makes no progress towards goals or if patient is discharged from hospital.  The above assessment, treatment plan, treatment alternatives and goals were discussed and mutually agreed upon: by patient  Janey Genta 12/11/2022, 5:19 PM

## 2022-12-11 NOTE — Evaluation (Signed)
Physical Therapy Assessment and Plan  Patient Details  Name: Leslie [name] MRN: [id number] Date of Birth: 1941/07/08  PT Diagnosis: Abnormal posture, Abnormality of gait, Cognitive deficits, Difficulty walking, Hemiparesis non-dominant, Impaired cognition, Impaired sensation, Muscle weakness, and Pain in back Rehab Potential: Good ELOS: ~2 weeks   Today's Date: 12/11/2022 PT Individual Time: 7262-0355 PT Individual Time Calculation (min): 4 min    Hospital Problem: Principal Problem:   Cerebrovascular accident (CVA) due to occlusion of right posterior communicating artery ([place])   Past Medical History:  Past Medical History:  Diagnosis Date   Arthritis    Back pain due to injury    fell off a ladder and fractured back-T12 down (multiple surgeries w/ onging pain)   Chronic kidney disease    "stage 3 kidney failure" improving per pt   Hypothyroidism    Insomnia    Plantar fasciitis    Stomach ulcer ~2000   Stroke ([place])    TIA X2   2001, jan 2017   Vertigo, benign paroxysmal, bilateral    Vitamin D deficiency    Past Surgical History:  Past Surgical History:  Procedure Laterality Date   ABDOMINAL HYSTERECTOMY  1974   ANKLE FUSION Right 06/07/2016   Procedure: RIGHT TALONAVICULAR ARTHRODESIS ;  Surgeon: Wylene Simmer, MD;  Location: [place];  Service: Orthopedics;  Laterality: Right;   APPENDECTOMY     BACK SURGERY     x4  fusion T12 thru sacrum; U rod in sacrum   BUBBLE STUDY  11/26/2022   Procedure: BUBBLE STUDY;  Surgeon: Elouise Munroe, MD;  Location: [place];  Service: Cardiovascular;;   CARDIOVERSION N/A 11/26/2022   Procedure: CARDIOVERSION;  Surgeon: Elouise Munroe, MD;  Location: [facility] ENDOSCOPY;  Service: Cardiovascular;  Laterality: N/A;   CARDIOVERSION N/A 12/05/2022   Procedure: CARDIOVERSION;  Surgeon: Pixie Casino, MD;  Location: [facility] ENDOSCOPY;  Service: Cardiovascular;  Laterality: N/A;   CATARACT EXTRACTION, BILATERAL  Bilateral 2015   GASTROCNEMIUS RECESSION Right 06/07/2016   Procedure: RIGHT GASTROC RECESSION ;  Surgeon: Wylene Simmer, MD;  Location: [place];  Service: Orthopedics;  Laterality: Right;   HAND SURGERY Left 2003   x2   IR ANGIO EXTRACRAN SEL COM CAROTID INNOMINATE UNI L MOD SED  11/03/2022   IR CT HEAD LTD  11/03/2022   IR PERCUTANEOUS ART THROMBECTOMY/INFUSION INTRACRANIAL INC DIAG ANGIO  11/03/2022   L heel surgery for torn tendon Left 2015   PLANTAR FASCIA SURGERY Right 2009   surgery at [place] 11/03/2022   Procedure: IR WITH ANESTHESIA;  Surgeon: Radiologist, Medication, MD;  Location: [place];  Service: Radiology;  Laterality: N/A;   SHOULDER ARTHROSCOPY Right 2007   TEE WITHOUT CARDIOVERSION N/A 11/26/2022   Procedure: TRANSESOPHAGEAL ECHOCARDIOGRAM (TEE);  Surgeon: Elouise Munroe, MD;  Location: [facility] ENDOSCOPY;  Service: Cardiovascular;  Laterality: N/A;   TENDON REPAIR Right 1949   foot- lacerated tendon in arch of foot   THORACENTESIS N/A 10/30/2022   Procedure: THORACENTESIS;  Surgeon: Julian Hy, DO;  Location: [place];  Service: Cardiopulmonary;  Laterality: N/A;   TOTAL HIP ARTHROPLASTY Left 10/05/2021   Procedure: TOTAL HIP ARTHROPLASTY ANTERIOR APPROACH;  Surgeon: Rod Can, MD;  Location: [facility];  Service: Orthopedics;  Laterality: Left;    Assessment & Plan Clinical Impression: Patient is a 82 y.o. year old female right-handed female with history of prior CVA, CKD GERD recent bouts of cough treated with antibiotics  and steroids was admitted with reports of 2-3 weeks of palpitations, seen by PCP for lightheadedness and dizziness found to have A-fib with RVR and sent to [facility] 10/29/2022 for evaluation.  She was started on intravenous Cardizem and heparin.  CT chest done due to episodes of chest pain and reports of cough revealed right layering pleural effusion and right upper lobe pneumonia.  She was  started on ceftriaxone and Zithromax for CAP and underwent thoracentesis of 500 cc parapneumonic effusion by PCCM Dr. Martinique consulted for management of A-fib and felt that new onset likely due to pneumonia.  Echocardiogram with ejection fraction of 40 to 45% with LV global hypokinesis with moderate asymmetric left LVH moderate to severe MVR and dilated inferior vena cava.  She was started on metoprolol but reported shortness of breath side effects therefore was discontinued Cardizem was titrated.  She was transition to Eliquis.  On 11/25 patient had sudden onset of right gaze deviation with left hemiparesis and neglect.  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  Neurology felt stroke was embolic due to A-fib with RVR even on Eliquis.  Follow-up MRI brain done revealing small acute infarct right frontal temporal lobe and caudate body with advanced chronic small vessel disease.  Dr. Curt Bears following for ongoing issues with A-flutter converted with IV amiodarone 11/25 and Cardizem titrated but heart rates continued to run 120s at rest.  Modified barium swallow done revealing moderate spillage with buccal residual and delay in swallow with occasional trace aspiration.  She was placed on a dysphagia #3 diet.  Patient was admitted to inpatient rehab services 11/08/2022 with bouts of orthostasis A-fib heart rate in the 160s.  On 11/23/2022 she was started on fluid bolus due to hypotension.  Dr. Percival Spanish was consulted recommending intravenous amiodarone bolus for rate control.  Dr. Lorrine Kin consulted for hospitalist and assistance to discharge to acute care services as patient would have to remain on telemetry.  Patient is stabilized cardiac rate controlled she remains on Eliquis as well as amiodarone 200 mg daily.  Hospital course complicated by Enterococcus UTI completing 7-day course of  antibiotics.  Due to patient decreased functional mobility left-sided inattention was admitted for a comprehensive rehab program. Patient transferred to CIR on 12/10/2022 .   Patient currently requires min assist with mobility secondary to muscle weakness, decreased cardiorespiratoy endurance, impaired timing and sequencing, unbalanced muscle activation, motor apraxia, and decreased motor planning, decreased attention to left, decreased initiation, decreased attention, decreased awareness, decreased problem solving, decreased safety awareness, decreased memory, and delayed processing, and decreased standing balance, decreased postural control, and decreased balance strategies.  Prior to hospitalization, patient was independent  with mobility and lived Alone in a House home.  Home access is 2Stairs to enter.  Patient will benefit from skilled PT intervention to maximize safe functional mobility, minimize fall risk, and decrease caregiver burden for planned discharge home with 24 hour supervision.  Anticipate patient will benefit from follow up [place] at discharge.  PT - End of Session Activity Tolerance: Tolerates 30+ min activity with multiple rests Endurance Deficit: Yes Endurance Deficit Description: requires frequent seated rest breaks PT Assessment Rehab Potential (ACUTE/IP ONLY): Good PT Barriers to Discharge: Decreased caregiver support;Home environment access/layout;Lack of/limited family support;Incontinence;Neurogenic Bowel & Bladder PT Patient demonstrates impairments in the following area(s): Balance;Sensory;Skin Integrity;Behavior;Endurance;Motor;Nutrition;Pain;Perception;Safety PT Transfers Functional Problem(s): Bed Mobility;Bed to Chair;Car;Furniture;Floor PT Locomotion Functional Problem(s): Ambulation;Stairs PT  Plan PT Intensity: Minimum of 1-2 x/day ,45 to 90 minutes PT Frequency: 5 out of 7 days PT Duration Estimated Length of Stay: ~2 weeks PT Treatment/Interventions:  Ambulation/gait training;Community reintegration;DME/adaptive equipment instruction;Neuromuscular re-education;Psychosocial support;Stair training;UE/LE Strength taining/ROM;Wheelchair propulsion/positioning;UE/LE Coordination activities;Therapeutic Activities;Skin care/wound management;Pain management;Discharge planning;Balance/vestibular training;Cognitive remediation/compensation;Disease management/prevention;Functional mobility training;Patient/family education;Splinting/orthotics;Therapeutic Exercise;Visual/perceptual remediation/compensation PT Transfers Anticipated Outcome(s): supervision using LRAD PT Locomotion Anticipated Outcome(s): supervision using LRAD PT Recommendation Recommendations for Other Services: Therapeutic Recreation consult Therapeutic Recreation Interventions: Pet therapy;Kitchen group;Stress management Follow Up Recommendations: Home health PT;24 hour supervision/assistance Patient destination: Home Equipment Recommended: To be determined   PT Evaluation Precautions/Restrictions Precautions Precautions: Fall;Other (comment) Precaution Comments: orthostatic, TEDs and ABD binder Restrictions Weight Bearing Restrictions: No Pain Pain Assessment Pain Scale: 0-10 Pain Score: 0-No pain Pain Interference Pain Interference Pain Effect on Sleep: 3. Frequently Pain Interference with Therapy Activities: 1. Rarely or not at all Pain Interference with Day-to-Day Activities: 2. Occasionally Home Living/Prior Functioning Home Living Available Help at Discharge: Family (dauther, brother, and step-son - per prior admission pt's daughter planning to arrange 24/7 supervision) Type of Home: House Home Access: Stairs to enter [employer] of Steps: 2 Entrance Stairs-Rails: Right;Left Home Layout: Two level;Able to live on main level with bedroom/bathroom Alternate Level Stairs-Number of Steps: 4 steps down to main living room w/ rail Bathroom Shower/Tub: Clinical cytogeneticist: Standard Bathroom Accessibility: Yes Additional Comments: Daughter to arrange 24/7 supervision; she is here with her spouse from [place] With: Alone Prior Function Level of Independence: Independent with homemaking with ambulation;Requires assistive device for independence;Independent with gait;Independent with basic ADLs  Able to Take Stairs?: Yes Driving: Yes Vocation: Retired Vision/Perception  Vision - History Ability to See in Adequate Light: 1 Impaired Perception Perception: Impaired Inattention/Neglect: Does not attend to left visual field;Does not attend to left side of body Praxis Praxis: Impaired Praxis Impairment Details: Motor planning;Perseveration;Initiation Praxis-Other Comments: pt with perseverative L UE grasp  Cognition Overall Cognitive Status: Impaired/Different from baseline Arousal/Alertness: Awake/alert Orientation Level: Oriented to person;Oriented to place;Oriented to time;Disoriented to situation (states she is in "[place], [place]" at the "hospital" and reason for being here is "to be evaluated to see if I need more training") Year: 2024 Month: January Day of Week: Incorrect (states it is Wednesday) Attention: Focused;Sustained Focused Attention: Appears intact Sustained Attention: Impaired Selective Attention: Impaired Memory: Impaired Awareness: Impaired Problem Solving: Impaired Behaviors: Restless;Perseveration Safety/Judgment: Impaired Sensation Sensation Light Touch: Impaired Detail Central sensation comments: unable to feel light touch nor crude touch in L LE when vision obstructed Light Touch Impaired Details: Impaired LLE Hot/Cold: Not tested Proprioception: Impaired Detail Proprioception Impaired Details: Impaired LLE Stereognosis: Not tested Coordination Gross Motor Movements are Fluid and Coordinated: No Coordination and Movement Description: GM movements impaired due to L hemipareis, L inattention, and  impaired motor planning Motor  Motor Motor: Other (comment);Motor perseverations Motor - Skilled Clinical Observations: mild L hemipareisis, with decreased L attention, and impaired awareness   Trunk/Postural Assessment  Cervical Assessment Cervical Assessment: Exceptions to [facility] (slight forward head) Thoracic Assessment Thoracic Assessment: Exceptions to [facility] (mild rounding of shoulders) Lumbar Assessment Lumbar Assessment: Exceptions to [facility] (flexible posterior pelvic tilt in sitting) Postural Control Postural Control: Deficits on evaluation Protective Responses: delayed and inadequate in standing Postural Limitations: decreased  Balance Static Sitting Balance Static Sitting - Balance Support: Feet supported Static Sitting - Level of Assistance: 5: Stand by assistance Dynamic Sitting Balance Dynamic Sitting - Balance Support: Feet supported Dynamic Sitting - Level of Assistance:  5: Stand by assistance Static Standing Balance Static Standing - Balance Support: During functional activity;Bilateral upper extremity supported Static Standing - Level of Assistance: Other (comment) (CGA) Dynamic Standing Balance Dynamic Standing - Balance Support: During functional activity;Bilateral upper extremity supported Dynamic Standing - Level of Assistance: 4: Min assist Extremity Assessment      RLE Assessment RLE Assessment: Exceptions to [facility] Active Range of Motion (AROM) Comments: WFL General Strength Comments: pt with difficulty following commands to perform formal testing but demos grossly 4-/5 to 4/5 throughout functionally with continued weakness in hip flexors (pt continues to attempt to lift her LE using her hands, although improving) LLE Assessment LLE Assessment: Exceptions to [facility] Active Range of Motion (AROM) Comments: [facility] General Strength Comments: pt with difficulty following commands to perform formal testing but demos grossly 4-/5 to 4/5 throughout functionally with continued  weakness in hip flexors (pt continues to attempt to lift her LE using her hands, although improving)  Care Tool Care Tool Bed Mobility Roll left and right activity   Roll left and right assist level: Contact Guard/Touching assist    Sit to lying activity   Sit to lying assist level: Contact Guard/Touching assist    Lying to sitting on side of bed activity   Lying to sitting on side of bed assist level: the ability to move from lying on the back to sitting on the side of the bed with no back support.: Minimal Assistance - Patient > 75%     Care Tool Transfers Sit to stand transfer   Sit to stand assist level: Minimal Assistance - Patient > 75% Sit to stand assistive device: Walker  Chair/bed transfer   Chair/bed transfer assist level: Minimal Assistance - Patient > 75% Chair/bed transfer assistive device: Proofreader transfer assist level: Minimal Assistance - Patient > 75% Health visitor Comment: RW    Care Tool Locomotion Ambulation   Assist level: Minimal Assistance - Patient > 75% Assistive device: Walker-rolling Max distance: 32f  Walk 10 feet activity   Assist level: Minimal Assistance - Patient > 75% Assistive device: Walker-rolling   Walk 50 feet with 2 turns activity   Assist level: Minimal Assistance - Patient > 75% Assistive device: Walker-rolling  Walk 150 feet activity Walk 150 feet activity did not occur: Safety/medical concerns (impaired endurance)      Walk 10 feet on uneven surfaces activity Walk 10 feet on uneven surfaces activity did not occur: Safety/medical concerns      Stairs   Assist level: Minimal Assistance - Patient > 75% Stairs assistive device: 2 hand rails Max number of stairs: 4  Walk up/down 1 step activity   Walk up/down 1 step (curb) assist level: Minimal Assistance - Patient > 75% Walk up/down 1 step or curb assistive device: 2 hand rails  Walk up/down 4 steps activity   Walk  up/down 4 steps assist level: Minimal Assistance - Patient > 75% Walk up/down 4 steps assistive device: 2 hand rails  Walk up/down 12 steps activity Walk up/down 12 steps activity did not occur: Safety/medical concerns      Pick up small objects from floor   Pick up small object from the floor assist level: Maximal Assistance - Patient 25 - 49% Pick up small object from the floor assistive device: RW  Wheelchair Is the patient using a wheelchair?: Yes (used for transportation) Type of Wheelchair: MGeophysicist/field seismologist  assist level: Dependent - Patient 0%    Wheel 50 feet with 2 turns activity   Assist Level: Dependent - Patient 0%  Wheel 150 feet activity   Assist Level: Dependent - Patient 0%    Refer to Care Plan for Long Term Goals  SHORT TERM GOAL WEEK 1 PT Short Term Goal 1 (Week 1): Pt will perform supine<>sit with supervision consistently PT Short Term Goal 2 (Week 1): Pt will perform sit<>stands using LRAD with CGA consistently PT Short Term Goal 3 (Week 1): Pt will perform bed<>chair transfers using LRAD with CGA consistently PT Short Term Goal 4 (Week 1): Pt will ambulate at least 166f using LRAD with CGA PT Short Term Goal 5 (Week 1): Pt will navigate 4 steps using HRs with CGA  Recommendations for other services: Therapeutic Recreation  Pet therapy, Kitchen group, and Stress management  Skilled Therapeutic Intervention Pt received supine in bed with her family exiting upon therapist arrival. Pt states she is feeling so much better now that she is no longer in pain from feeling the need to have a BM. Pt agreeable to therapy session. Pt already wearing knee high TED hose - vitals assessed throughout session as described below. Evaluation completed (see details above) with patient education regarding purpose of PT evaluation, PT POC and goals, therapy schedule, weekly team meetings, and other CIR information including safety plan and fall risk safety. Pt performed the below  functional mobility tasks with the specified levels of skilled cuing and assistance. During gait training, pt with very slow speed and small steps with B LEs shaking, likely due to fatigue and muscle weakness.  Vitals during session:  - NT assessed them in supine: BP 110/63, HR 79bpm  - Sitting EOB: BP 113/97 (MAP 103), HR 91bpm  - after stand pivot transfer to w/c: BP 94/68 (MAP 76), HR 91bpm  - after [id number]: BP 89/55 (MAP 66), HR 90bpm  - after <2 min seated rest break for BP recovery: BP 101/68 (MAP 78), HR 86bpm  - after stairs: BP 103/64 (MAP 77), HR 88bpm   At end of session, pt left seated in recliner with needs in reach and seat belt alarm on.   Mobility Bed Mobility Bed Mobility: Supine to Sit;Sit to Supine Supine to Sit: Contact Guard/Touching assist;Minimal Assistance - Patient > 75% Sit to Supine: Contact Guard/Touching assist Transfers Transfers: Sit to Stand;Stand to Sit;Stand Pivot Transfers Sit to Stand: Minimal Assistance - Patient > 75% Stand to Sit: Minimal Assistance - Patient > 75% Stand Pivot Transfers: Minimal Assistance - Patient > 75% Stand Pivot Transfer Details: Verbal cues for safe use of DME/AE;Verbal cues for precautions/safety;Verbal cues for sequencing;Tactile cues for sequencing;Verbal cues for technique;Visual cues/gestures for sequencing;Tactile cues for initiation Transfer (Assistive device): Rolling walker Locomotion  Gait Ambulation: Yes Gait Assistance: Minimal Assistance - Patient > 75% Gait Distance (Feet): 90 Feet Assistive device: Rolling walker Gait Assistance Details: Verbal cues for safe use of DME/AE;Verbal cues for precautions/safety;Verbal cues for sequencing;Verbal cues for technique;Verbal cues for gait pattern;Visual cues for safe use of DME/AE;Tactile cues for posture;Tactile cues for weight shifting Gait Gait: Yes Gait Pattern: Impaired Gait Pattern: Poor foot clearance - left;Poor foot clearance - right;Narrow base of  support;Step-through pattern;Decreased step length - right;Decreased step length - left;Decreased stride length;Trunk flexed Gait velocity: significantly decreased Stairs / Additional Locomotion Stairs: Yes Stairs Assistance: Minimal Assistance - Patient > 75% Stair Management Technique: Two rails;Step to pattern;Forwards Number of Stairs: 4 Height of Stairs:  6 Wheelchair Mobility Wheelchair Mobility: No   Discharge Criteria: Patient will be discharged from PT if patient refuses treatment 3 consecutive times without medical reason, if treatment goals not met, if there is a change in medical status, if patient makes no progress towards goals or if patient is discharged from hospital.  The above assessment, treatment plan, treatment alternatives and goals were discussed and mutually agreed upon: by patient  Tawana Scale , PT, DPT, NCS, CSRS 12/11/2022, 12:19 PM

## 2022-12-11 NOTE — Progress Notes (Signed)
Inpatient Rehabilitation  Patient information reviewed and entered into eRehab system by Rebekka Lobello M. Kelicia Youtz, M.A., CCC/SLP, PPS Coordinator.  Information including medical coding, functional ability and quality indicators will be reviewed and updated through discharge.    

## 2022-12-11 NOTE — Progress Notes (Signed)
PMR Admission Coordinator Pre-Admission Assessment   Patient: Leslie [name] is an 82 y.o., female MRN: [id number] DOB: 06-22-1941 Height: 5' 7.5" (171.5 cm) Weight: 65.8 kg   Insurance Information HMO: yes    PPO:      PCP:      IPA:      80/20:      OTHER:  PRIMARY: [employer]      Policy#: [id number]      Subscriber: pt CM Name: Farrel Conners      Phone#: [phone] Ext [phone]     Fax#: [phone] Pre-Cert#: [id number] denied 12/29 Received approval from [place] on 12/31.   approved for admit  12/31-1/6  Employer:  Benefits:  Phone #: [phone]     Name: 12/20 Eff. Date: 12/10/21     Deduct: none      Out of Pocket Max: $3400       CIR: $295 co pay per day days 1 until 6      SNF: no c co pay days 1 until 20; $196 co pay per day days 21 until 100 Outpatient: $ 10 to $20 per visit     Co-Pay: visits per medical neccesity Home Health: 100%      Co-Pay: none DME: 80%     Co-Pay: 20% Providers: in network  SECONDARY: none   Financial Counselor:       Phone#:    The Engineer, [employer]" for patients in Inpatient Rehabilitation Facilities with attached "Privacy Act [place] Records" was provided and verbally reviewed with: Family   Emergency Contact Information Contact Information       Name Relation Home Work [place] Daughter [phone]   [phone]    Assunta Gambles [phone]   [phone]         Current Medical History  Patient Admitting Diagnosis: Afib with RVR, CVA   History of Present Illness: 82 year old female with history of CVA, afib/flutter , hypothyroidism, GERD with recent CVA on CIR/inpatient rehabilitation. Recent hospitalization form 11/20 until 11/30 after being found to be in atrial fibrillation with RVR. She was treated with Cardizem drip and anticoagulation. . CT chest revealed right layering pleural effusion and right upper lobe pneumonia. Treated with Rocephin  and azithromycin. Thoracentesis  performed. Echo with EF 40 to 45 % with LV global hypokineses. Placed on Metoprolol but reported short of breath. Placed on Cardizem and transitioned to Eliquis with plans for outpatient cardioversion. Found to develop left sided hemiparesis and left sided neglect on 11/25. Imaging revealed right M1 Occlusion and patient underwent cerebral angiogram with thrombectomy of the right MCA occluded segment with complete revascularization. MRI revealed a small acute infarct of the right frontal, temporal lobe and caudate body with chronic small vessel disease. Neurology felt secondary to atrial fibrillation. Admitted to CIR on 11/ 30/23. While undergoing inpatient rehabilitation services, she has had issues with hypotension for which Cardizem CD was reduced. Also wrapping pt's legs as well as placing abdominal binder without significant improvement. Further Cardizem dosing changes with patient remaining orthostatic. On 12/15 patient found to have heart rate 160's with afib with RVR and severe ortho stasis. Cardiology consulted and patient readmitted to inpatient acute hospital on 11/23/22. Patient underwent TEE cardioversion on 12/18, now back in sinus rhythm. Still with some orthostatic hypotension, but is improving. Will titrate as needed.  Started on Midodrine. Continue Eliquis. Continue daily I and Os and daily weights with her heart failure with reduced EF and severe  MR. On 12/20 had episode when up to [facility] of unresponsiveness and returned in rapid atrial fibrillation. Amiodarone gtt initiated as well as digoxin. Repeat imaging with new CVA ruled out. No seizure on EEG ,monitoring. To continue with midodrine and TED hose/binder/abdominal binder for ortho stasis. Pt underwent second cardioversion on 12/27. Has undergone amiodarone load. Unable to tolerate nodal agents due to significant orthostatic hypotension. Likely will need amiodarone long term. Continue Eliquis. Pt. Was seen by PT/OT who recommended return to CIR.     [facility]'s medical record from [facility] has been reviewed by the rehabilitation admission coordinator and physician.   Past Medical History      Past Medical History:  Diagnosis Date   Arthritis     Back pain due to injury      fell off a ladder and fractured back-T12 down (multiple surgeries w/ onging pain)   Chronic kidney disease      "stage 3 kidney failure" improving per pt   Hypothyroidism     Insomnia     Plantar fasciitis     Stomach ulcer ~2000   Stroke [facility])      TIA X2   2001, jan 2017   Vertigo, benign paroxysmal, bilateral     Vitamin D deficiency      Has the patient had major surgery during 100 days prior to admission? Yes   Family History   family history includes Arthritis in her brother; Congestive Heart Failure in her sister; Diabetes in her sister; Hypertension in her mother and sister; Lung cancer in her father and mother.   Current Medications   Current Facility-Administered Medications:    acetaminophen (TYLENOL) tablet 325-650 mg, 325-650 mg, Oral, Q4H PRN, Pixie Casino, MD, 650 mg at 12/02/22 1723   alum & mag hydroxide-simeth (MAALOX/MYLANTA) 200-200-20 MG/5ML suspension 30 mL, 30 mL, Oral, Q4H PRN, Hilty, Nadean Corwin, MD   amiodarone (PACERONE) tablet 200 mg, 200 mg, Oral, Daily, Hilty, Nadean Corwin, MD, 200 mg at 12/07/22 4818   apixaban (ELIQUIS) tablet 5 mg, 5 mg, Oral, BID, Hilty, Nadean Corwin, MD, 5 mg at 12/07/22 5631   bisacodyl (DULCOLAX) suppository 10 mg, 10 mg, Rectal, Daily PRN **OR** sorbitol 70 % solution 60 mL, 60 mL, Oral, Daily PRN, Pixie Casino, MD   calcium carbonate (TUMS - dosed in mg elemental calcium) chewable tablet 200 mg of elemental calcium, 1 tablet, Oral, TID PRN, Hilty, Nadean Corwin, MD   camphor-menthol Timoteo Ace) lotion, , Topical, PRN, Pixie Casino, MD   cyanocobalamin (VITAMIN B12) tablet 1,000 mcg, 1,000 mcg, Oral, Daily, Hilty, Nadean Corwin, MD, 1,000 mcg at 12/07/22 0939   famotidine (PEPCID) tablet 20 mg, 20 mg,  Oral, Daily, Hilty, Nadean Corwin, MD, 20 mg at 12/07/22 4970   feeding supplement (ENSURE ENLIVE / ENSURE PLUS) liquid 237 mL, 237 mL, Oral, TID BM, Hilty, Nadean Corwin, MD, 237 mL at 12/07/22 1420   Gerhardt's butt cream, , Topical, BID, Hilty, Nadean Corwin, MD, 1 Application at 26/37/85 0940   guaiFENesin-dextromethorphan (ROBITUSSIN DM) 100-10 MG/5ML syrup 5-10 mL, 5-10 mL, Oral, Q6H PRN, Hilty, Nadean Corwin, MD   ipratropium-albuterol (DUONEB) 0.5-2.5 (3) MG/3ML nebulizer solution 3 mL, 3 mL, Nebulization, Q4H PRN, Hilty, Nadean Corwin, MD   levothyroxine (SYNTHROID) tablet 100 mcg, 100 mcg, Oral, Q0600, Pixie Casino, MD, 100 mcg at 12/07/22 0526   melatonin tablet 5 mg, 5 mg, Oral, QHS PRN, Pixie Casino, MD, 5 mg at 12/06/22 2024   midodrine (PROAMATINE) tablet  10 mg, 10 mg, Oral, TID WC, Elodia Florence., MD, 10 mg at 12/07/22 1620   ondansetron [facility]) injection 4 mg, 4 mg, Intravenous, Q6H PRN, Pixie Casino, MD, 4 mg at 12/05/22 1700   Oral care mouth rinse, 15 mL, Mouth Rinse, 4 times per day, Pixie Casino, MD, 15 mL at 12/07/22 1620   Oral care mouth rinse, 15 mL, Mouth Rinse, PRN, Hilty, Nadean Corwin, MD   prochlorperazine (COMPAZINE) tablet 5-10 mg, 5-10 mg, Oral, Q6H PRN, 5 mg at 11/30/22 0427 **OR** prochlorperazine (COMPAZINE) injection 5-10 mg, 5-10 mg, Intramuscular, Q6H PRN, 10 mg at 12/02/22 1855 **OR** prochlorperazine (COMPAZINE) suppository 12.5 mg, 12.5 mg, Rectal, Q6H PRN, Pixie Casino, MD   rosuvastatin (CRESTOR) tablet 10 mg, 10 mg, Oral, Daily, Hilty, Nadean Corwin, MD, 10 mg at 12/07/22 3474   senna-docusate (Senokot-S) tablet 2 tablet, 2 tablet, Oral, BID, Pixie Casino, MD, 2 tablet at 12/07/22 2595   sodium phosphate (FLEET) 7-19 GM/118ML enema 1 enema, 1 enema, Rectal, Once PRN, Hilty, Nadean Corwin, MD   Patients Current Diet:  Diet Order                  DIET DYS 3 Room service appropriate? Yes; Fluid consistency: Thin  Diet effective now                        Precautions / Restrictions Precautions Precautions: Fall, Other (comment) Precaution Comments: orthostatic, teds, ace wraps and ABD binder Restrictions Weight Bearing Restrictions: No Other Position/Activity Restrictions: TED hose, abdominal binder, ace wraps    Has the patient had 2 or more falls or a fall with injury in the past year? No   Prior Activity Level Limited Community (1-2x/wk): Mod I   Prior Functional Level Self Care: Did the patient need help bathing, dressing, using the toilet or eating? Independent   Indoor Mobility: Did the patient need assistance with walking from room to room (with or without device)? Independent   Stairs: Did the patient need assistance with internal or external stairs (with or without device)? Independent   Functional Cognition: Did the patient need help planning regular tasks such as shopping or remembering to take medications? Independent   Patient Information Are you of Hispanic, Latino/a,or Spanish origin?: A. No, not of Hispanic, Latino/a, or Spanish origin What is your race?: A. White Do you need or want an interpreter to communicate with a doctor or health care staff?: 9. Unable to respond   Patient's Response To:  Health Literacy and Transportation Is the patient able to respond to health literacy and transportation needs?: No Health Literacy - How often do you need to have someone help you when you read instructions, pamphlets, or other written material from your doctor or pharmacy?: Patient unable to respond In the past 12 months, has lack of transportation kept you from medical appointments or from getting medications?: No In the past 12 months, has lack of transportation kept you from meetings, work, or from getting things needed for daily living?: No   Development worker, international aid / [place] Devices/Equipment: None Home Equipment: Conservation officer, nature (2 wheels), [employer] - single point, Contractor Use:  Indicate devices/aids used by the patient prior to current illness, exacerbation or injury? Walker   Current Functional Level Cognition   Arousal/Alertness: Awake/alert Overall Cognitive Status: No family/caregiver present to determine baseline cognitive functioning Current Attention Level: Selective Orientation Level: Oriented to  situation, Oriented to person Following Commands: Follows one step commands consistently Safety/Judgement: Decreased awareness of deficits, Decreased awareness of safety General Comments: highly distractable, poor attention. needs a non-distracting, clear environment and maximal verbal cues. Pt perseverating on having "crumbs" in her teeth throughout sesssion despite thorough oral care Attention: Focused, Sustained Focused Attention: Appears intact Sustained Attention: Impaired Memory: Impaired Memory Impairment: Storage deficit, Retrieval deficit, Decreased recall of new information Awareness: Impaired Awareness Impairment: Intellectual impairment, Emergent impairment Problem Solving: Impaired Problem Solving Impairment: Functional basic, Verbal basic Executive Function: Reasoning, Sequencing, Organizing, Decision Making, Self Monitoring, Self Correcting Reasoning: Impaired Reasoning Impairment: Verbal basic, Functional basic Sequencing: Impaired Sequencing Impairment: Verbal basic, Functional basic Organizing: Impaired Organizing Impairment: Verbal basic, Functional basic Decision Making: Impaired Decision Making Impairment: Verbal basic, Functional basic Initiating: Impaired Initiating Impairment: Verbal basic, Functional basic Self Monitoring: Impaired Self Monitoring Impairment: Verbal basic, Functional basic Self Correcting: Impaired Self Correcting Impairment: Verbal basic, Functional basic Behaviors: Perseveration Safety/Judgment: Impaired    Extremity Assessment (includes Sensation/Coordination)   Upper Extremity Assessment: LUE  deficits/detail LUE Deficits / Details: using to complete oral hygiene LUE Sensation: decreased light touch, decreased proprioception LUE Coordination: decreased fine motor, decreased gross motor  Lower Extremity Assessment: Defer to PT evaluation     ADLs   Overall ADL's : Needs assistance/impaired Eating/Feeding: Set up Eating/Feeding Details (indicate cue type and reason): eating with HOB 30* upon arrival Grooming: Oral care, Maximal assistance, Sitting Upper Body Bathing: Minimal assistance, Sitting, Bed level Lower Body Bathing: Moderate assistance, Sit to/from stand Upper Body Dressing : Minimal assistance, Sitting Lower Body Dressing: Sitting/lateral leans, Maximal assistance Lower Body Dressing Details (indicate cue type and reason): donning socks and ted hose Toilet Transfer: Minimal assistance, Stand-pivot, Rolling walker (2 wheels) Toilet Transfer Details (indicate cue type and reason): simulated to recliner Toileting- Clothing Manipulation and Hygiene: Moderate assistance, Bed level Toileting - Clothing Manipulation Details (indicate cue type and reason): bridging in supine to assist with doffing mesh underwear Functional mobility during ADLs: Minimal assistance, Cueing for safety, Cueing for sequencing, Rolling walker (2 wheels) General ADL Comments: significantly increased time and cues needed for all funcitonal tasks     Mobility   Overal bed mobility: Needs Assistance Bed Mobility: Supine to Sit, Sit to Supine Rolling: Supervision Supine to sit: Min assist Sit to supine: Min assist General bed mobility comments: assistance for LE support to return to bed. cues for technique     Transfers   Overall transfer level: Needs assistance Equipment used: Rolling walker (2 wheels) Transfers: Sit to/from Stand Sit to Stand: Min assist, Mod assist Bed to/from chair/wheelchair/BSC transfer type:: Step pivot Step pivot transfers: Min assist General transfer comment: abdominal  binder donned in sitting prior to standing. patient declined ace wraps today due to discomfort previously. multiple standing bouts performed varying from Min A to Mod A. verbal cues for hand placement for safety and  faciliation for anterior weight shifting     Ambulation / Gait / Stairs / Wheelchair Mobility   Ambulation/Gait Ambulation/Gait assistance: Herbalist (Feet): 15 Feet Assistive device: Rolling walker (2 wheels) Gait Pattern/deviations: Step-through pattern General Gait Details: verbal cues for rolling walker negotation and occasional steadying assistance provided. further ambulation deferred due to blood pressure in standing (79/60), although patient reports no dizziness with standing activity Gait velocity: decreased Gait velocity interpretation: <1.31 ft/sec, indicative of household ambulator     Posture / Balance Dynamic Sitting Balance Sitting balance - Comments: initially poor Balance Overall balance  assessment: Needs assistance Sitting-balance support: Feet supported Sitting balance-Leahy Scale: Fair Sitting balance - Comments: initially poor Standing balance support: Bilateral upper extremity supported Standing balance-Leahy Scale: Poor Standing balance comment: external support required. standing tolerance of 1.5 minutes performed x 3 bouts     Special needs/care consideration Fall precautions due to decreased safety awareness TED/wraps and abdominal binder for ortho stasis    Previous Home Environment  Living Arrangements: Alone  Lives With: Alone Available Help at Discharge: Family, Available 24 hours/day Type of Home: House Home Layout: Two level, Able to live on main level with bedroom/bathroom Alternate Level Stairs-Number of Steps: 4 steps down to main living room w/ rail Home Access: Stairs to enter Entrance Stairs-Rails: Right, Left Entrance Stairs-Number of Steps: 2 Bathroom Shower/Tub: Multimedia programmer: Standard Bathroom  Accessibility: Yes How Accessible: Accessible via walker [place]: No Additional Comments: Daughter to arrange 24/7 supervision; she is here with her spouse from [place]   Discharge Living Setting Plans for Discharge Living Setting: Patient's home, House Type of Home at Discharge: House Discharge Home Layout: Two level, Able to live on main level with bedroom/bathroom Alternate Level Stairs-Rails:  (4 steps down to main living room) Discharge Home Access: Stairs to enter Entrance Stairs-Rails: Right, Left Entrance Stairs-Number of Steps: 2 Discharge Bathroom Shower/Tub: Walk-in shower Discharge Bathroom Toilet: Standard Discharge Bathroom Accessibility: Yes How Accessible: Accessible via walker Does the patient have any problems obtaining your medications?: No   Social/Family/Support Systems Patient Roles: Parent Contact Information: daughter, Pam Anticipated Caregiver: Daughter pam, SIL and hired Building control surveyor Information: see contacts Ability/Limitations of Caregiver: DTr here from [place] Availability: 24/7 Discharge Plan Discussed with Primary Caregiver: Yes Is Caregiver In Agreement with Plan?: Yes Does Caregiver/Family have Issues with Lodging/Transportation while Pt is in Rehab?: No   Goals Patient/Family Goal for Rehab: supervision with PT, OT and SLP Expected length of stay: ELOS 5 to 7 days Pt/Family Agrees to Admission and willing to participate: Yes Program Orientation Provided & Reviewed with Pt/Caregiver Including Roles  & Responsibilities: Yes   Decrease burden of Care through IP rehab admission: n/a   Possible need for SNF placement upon discharge: not anticipated   Patient Condition: I have reviewed medical records from [facility], spoken with CM, and patient and daughter. I met with patient at the bedside for inpatient rehabilitation assessment.  Patient will benefit from ongoing PT, OT, and SLP, can  actively participate in 3 hours of therapy a day 5 days of the week, and can make measurable gains during the admission.  Patient will also benefit from the coordinated team approach during an Inpatient Acute Rehabilitation admission.  The patient will receive intensive therapy as well as Rehabilitation physician, nursing, social worker, and care management interventions.  Due to bladder management, bowel management, safety, skin/wound care, disease management, medication administration, pain management, and patient education the patient requires 24 hour a day rehabilitation nursing.  The patient is currently Min A- Mod A  with mobility and basic ADLs.  Discharge setting and therapy post discharge at home with home health is anticipated.  Patient has agreed to participate in the Acute Inpatient Rehabilitation Program and will admit today.   Preadmission Screen Completed [id number] Boyette RN MSN with updates by Clemens Catholic, MS, CCC-SLP   Cleatrice Burke, 12/07/2022 4:55 PM ______________________________________________________________________   Discussed status with Dr. Letta Pate  on 12/10/22  at 57 and received approval for admission today.   Admission Coordinator:  Danne Baxter RN MSN with updates by Clemens Catholic, MS, CCC-SLP Julious Payer Audelia Acton, RN, time 12/10/22    Sudie Grumbling 930    Assessment/Plan: Diagnosis:Right MCA infarct  Does the need for close, 24 hr/day Medical supervision in concert with the patient's rehab needs make it unreasonable for this patient to be served in a less intensive setting? Yes Co-Morbidities requiring supervision/potential complications: Afib RVR, GERD, orthostatic hypotension, hx recent PNALV hypokinesis Due to bladder management, bowel management, safety, skin/wound care, disease management, medication administration, pain management, and patient education, does the patient require 24 hr/day rehab nursing? Yes Does the patient require coordinated care of a  physician, rehab nurse, PT, OT, and SLP to address physical and functional deficits in the context of the above medical diagnosis(es)? Yes Addressing deficits in the following areas: balance, endurance, locomotion, strength, transferring, bowel/bladder control, bathing, dressing, grooming, toileting, and psychosocial support Can the patient actively participate in an intensive therapy program of at least 3 hrs of therapy 5 days a week? Yes The potential for patient to make measurable gains while on inpatient rehab is good Anticipated functional outcomes upon discharge from inpatient rehab: supervision PT, supervision OT, supervision SLP Estimated rehab length of stay to reach the above functional goals is: 7d Anticipated discharge destination: Home 10. Overall Rehab/Functional Prognosis: good     MD Signature: Charlett Blake [name] [facility] Fellow Am Acad of Phys Med and Rehab Diplomate Am Board of Electrodiagnostic Med

## 2022-12-11 NOTE — Progress Notes (Signed)
PROGRESS NOTE   Subjective/Complaints:  No issues overnite except constipation   ROS:  Denies abd pain, N/V, no breathing issues   Objective:   No results found. Recent Labs    12/10/22 0147 12/11/22 0546  WBC 9.7 9.5  HGB 12.1 12.3  HCT 39.1 39.3  PLT 241 228   Recent Labs    12/10/22 0147 12/11/22 0546  NA 139 138  K 3.8 3.8  CL 101 102  CO2 27 27  GLUCOSE 96 104*  BUN 11 15  CREATININE 1.09* 1.30*  CALCIUM 9.8 9.3    Intake/Output Summary (Last 24 hours) at 12/11/2022 0850 Last data filed at 12/10/2022 2100 Gross per 24 hour  Intake 360 ml  Output --  Net 360 ml     Pressure Injury 12/10/22 Buttocks Right Stage 2 -  Partial thickness loss of dermis presenting as a shallow open injury with a red, pink wound bed without slough. Pink, open (Active)  12/10/22 1936  Location: Buttocks  Location Orientation: Right  Staging: Stage 2 -  Partial thickness loss of dermis presenting as a shallow open injury with a red, pink wound bed without slough.  Wound Description (Comments): Pink, open  Present on Admission: Yes    Physical Exam: Vital Signs Blood pressure 139/77, pulse (!) 57, temperature 98.7 F (37.1 C), temperature source Oral, resp. rate 16, height '5\' 7"'$  (1.702 m), weight 64 kg, SpO2 100 %.   General: No acute distress Mood and affect are appropriate Heart: Regular rate and rhythm no rubs murmurs or extra sounds Lungs: Clear to auscultation, breathing unlabored, no rales or wheezes Abdomen: Positive bowel sounds, soft nontender to palpation, nondistended Extremities: No clubbing, cyanosis, or edema Skin: No evidence of breakdown, no evidence of rash Neurologic: Cranial nerves II through XII intact, motor strength is 5/5 in bilateral deltoid, bicep, tricep, grip, hip flexor, knee extensors, ankle dorsiflexor and plantar flexor Visual fields intact to confrontation testing  Cerebellar exam normal  finger to nose to finger as well as heel to shin in bilateral upper and lower extremities Musculoskeletal: Full range of motion in all 4 extremities. No joint swelling   Assessment/Plan: 1. Functional deficits which require 3+ hours per day of interdisciplinary therapy in a comprehensive inpatient rehab setting. Physiatrist is providing close team supervision and 24 hour management of active medical problems listed below. Physiatrist and rehab team continue to assess barriers to discharge/monitor patient progress toward functional and medical goals  Care Tool:  Bathing              Bathing assist       Upper Body Dressing/Undressing Upper body dressing        Upper body assist      Lower Body Dressing/Undressing Lower body dressing            Lower body assist       Toileting Toileting    Toileting assist       Transfers Chair/bed transfer  Transfers assist           Locomotion Ambulation   Ambulation assist              Walk 10  feet activity   Assist           Walk 50 feet activity   Assist           Walk 150 feet activity   Assist           Walk 10 feet on uneven surface  activity   Assist           Wheelchair     Assist               Wheelchair 50 feet with 2 turns activity    Assist            Wheelchair 150 feet activity     Assist          Blood pressure 139/77, pulse (!) 57, temperature 98.7 F (37.1 C), temperature source Oral, resp. rate 16, height '5\' 7"'$  (1.702 m), weight 64 kg, SpO2 100 %.  Medical Problem List and Plan: 1. 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  Antithrombotics: -DVT/anticoagulation:  Pharmaceutical: Eliquis             -antiplatelet therapy: N/A 3. Pain Management: Tylenol as needed 4. Mood/Behavior/Sleep: Provide emotional support              -antipsychotic agents: N/A 5. Neuropsych/cognition: This patient is capable of making decisions on her own behalf. 6. Skin/Wound Care: Routine skin checks 7. Fluids/Electrolytes/Nutrition: Routine in and outs with follow-up chemistries 8.  Atrial fibrillation.  Continue amiodarone 200 mg daily.  Eliquis as directed.  Cardiac rate controlled 9.  Orthostasis.  ProAmatine 5 mg twice daily.  Monitor with increased mobility 10.  Hypothyroidism.  Synthroid  11.  Constipation- benign abd exam, on colace, add Miralax   LOS: 1 days A FACE TO FACE EVALUATION WAS PERFORMED  Charlett Blake 12/11/2022, 8:50 AM

## 2022-12-11 NOTE — Evaluation (Signed)
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 ([place])  Past Medical History:  Past Medical History:  Diagnosis Date   Arthritis    Back pain due to injury    fell off a ladder and fractured back-T12 down (multiple surgeries w/ onging pain)   Chronic kidney disease    "stage 3 kidney failure" improving per pt   Hypothyroidism    Insomnia    Plantar fasciitis    Stomach ulcer ~2000   Stroke ([place])    TIA X2   2001, jan 2017   Vertigo, benign paroxysmal, bilateral    Vitamin D deficiency    Past Surgical History:  Past Surgical History:  Procedure Laterality Date   ABDOMINAL HYSTERECTOMY  1974   ANKLE FUSION Right 06/07/2016   Procedure: RIGHT TALONAVICULAR ARTHRODESIS ;  Surgeon: Wylene Simmer, MD;  Location: [place];  Service: Orthopedics;  Laterality: Right;   APPENDECTOMY     BACK SURGERY     x4  fusion T12 thru sacrum; U rod in sacrum   BUBBLE STUDY  11/26/2022   Procedure: BUBBLE STUDY;  Surgeon: Elouise Munroe, MD;  Location: [place];  Service: Cardiovascular;;   CARDIOVERSION N/A 11/26/2022   Procedure: CARDIOVERSION;  Surgeon: Elouise Munroe, MD;  Location: [facility] ENDOSCOPY;  Service: Cardiovascular;  Laterality: N/A;   CARDIOVERSION N/A 12/05/2022   Procedure: CARDIOVERSION;  Surgeon: Pixie Casino, MD;  Location: [facility] ENDOSCOPY;  Service: Cardiovascular;  Laterality: N/A;   CATARACT EXTRACTION, BILATERAL Bilateral 2015   GASTROCNEMIUS RECESSION Right 06/07/2016   Procedure: RIGHT GASTROC RECESSION ;  Surgeon: Wylene Simmer, MD;  Location: [place];  Service: Orthopedics;  Laterality: Right;   HAND SURGERY Left 2003   x2   IR ANGIO EXTRACRAN SEL COM CAROTID INNOMINATE UNI L MOD SED  11/03/2022   IR CT HEAD LTD  11/03/2022   IR PERCUTANEOUS ART THROMBECTOMY/INFUSION INTRACRANIAL INC DIAG ANGIO  11/03/2022   L heel surgery for torn tendon Left 2015   PLANTAR FASCIA SURGERY Right 2009   surgery at [place] 11/03/2022   Procedure: IR WITH ANESTHESIA;  Surgeon: Radiologist, Medication, MD;  Location: [place];  Service: Radiology;  Laterality: N/A;   SHOULDER ARTHROSCOPY Right 2007   TEE WITHOUT CARDIOVERSION N/A 11/26/2022   Procedure: TRANSESOPHAGEAL ECHOCARDIOGRAM (TEE);  Surgeon: Elouise Munroe, MD;  Location: [facility] ENDOSCOPY;  Service: Cardiovascular;  Laterality: N/A;   TENDON REPAIR Right 1949   foot- lacerated tendon in arch of foot   THORACENTESIS N/A 10/30/2022   Procedure: THORACENTESIS;  Surgeon: Julian Hy, DO;  Location: [place];  Service: Cardiopulmonary;  Laterality: N/A;   TOTAL HIP ARTHROPLASTY Left 10/05/2021   Procedure: TOTAL HIP ARTHROPLASTY ANTERIOR APPROACH;  Surgeon: Rod Can, MD;  Location: [facility];  Service: Orthopedics;  Laterality: Left;    Assessment / Plan / Recommendation Clinical Impression Leslie [name] is a 82 year old right-handed female with history of prior CVA, CKD GERD recent bouts of cough treated with antibiotics and steroids was admitted with reports of 2-3 weeks of palpitations, seen by PCP for lightheadedness and dizziness found to have A-fib  with RVR and sent to [facility] 10/29/2022 for evaluation. On 11/25 patient had sudden onset of right gaze deviation with left hemiparesis and neglect.  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 Follow-up MRI brain done revealing small acute infarct right frontal temporal  lobe and caudate body with advanced chronic small vessel disease. Patient was admitted to inpatient rehab services 11/08/2022 with bouts of orthostasis A-fib heart rate in the 160s.  On 11/23/2022 she was started on fluid bolus due to hypotension.  Dr. Percival Spanish was consulted recommending intravenous amiodarone bolus for rate control.  Dr. Lorrine Kin consulted for hospitalist and assistance to discharge to acute care services as patient would have to remain on telemetry.  Patient is stabilized cardiac rate controlled she remains on Eliquis as well as amiodarone 200 mg daily. Hospital course complicated by Enterococcus UTI completing 7-day course of antibiotics.  Due to patient decreased functional mobility left-sided inattention was admitted for a comprehensive rehab program.   Pt is known to this service. Last seen in CIR on 11/23/22. Pt was supposed to discharge home with 24 hour supervision from daughter however she transferred to acute due to cardiac episode on day of planned discharge. Today's assessment was limited by fatigue and perseverating on recent bowel movement and size/appearance of stool. Pt minimally redirectable. Significant distractibility is not unusual for this pt based on this documenting SLP's experience from prior admission. Per H&P, there do not appear to be any acute episodes or dx that may contribute to any acute cognitive-linguistic, speech, or swallowing changes since recent admission to CIR. Based on imaging and discussion with MD, suspect pt may have underlying age related cognitive decline at baseline.   Consistent with prior admission, pt continues to exhibit a mild oral dysphagia characterized by mild anterior spillage on left with solids with minimal awareness. Pt exhibited on clinical s/sx of aspiration with current diet. Recommend pt continue with dysphagia 3 textures and thin liquids with intermittent supervision during meals to monitor anterior spillage and/or pocketing on  left side and to ensure there are minimal environmental distractions. Recommend crushed medications. May try small, whole pills with water or puree as tolerated and per pt's comfort. Pt verbalized understanding and agreement with plan.   SLP was only able to obtain informal cognitive -linguistic assessment measures due to pt falling asleep during attempt to complete SLUMS. Pt demonstrates deficits in the domains of attention, memory, problem solving, and awareness. Attention deficits appear to negatively impact comprehension. Verbal expression grossly intact for all tasks assessed. Cognitive function appears generally consistent with previous admission, however perseveration caused more significant distractibility today. Given the abovementioned deficits, pt would benefit from skilled ST while inpatient in order to maximize functional independence and reduce burden of care prior to discharge. As discussed before, anticipate that pt will need 24/7 supervision at discharge in addition to [place] follow up at next level of care, preferably in home health setting.    Skilled Therapeutic Interventions          CSE and informal assessment measures administered. Please see report for full details.   SLP Assessment  Patient will need skilled Speech Lanaguage Pathology Services during CIR admission    Recommendations  SLP Diet Recommendations: Dysphagia 3 (Mech soft);Thin Liquid Administration via: Cup;Straw Medication Administration: Crushed with puree Supervision: Patient able to self feed;Full supervision/cueing for compensatory strategies Compensations: Slow rate;Small sips/bites;Lingual sweep for clearance of  pocketing;Minimize environmental distractions Postural Changes and/or Swallow Maneuvers: Seated upright 90 degrees Oral Care Recommendations: Oral care BID Recommendations for Other Services: Neuropsych consult;Therapeutic Recreation consult Therapeutic Recreation Interventions: Pet therapy Patient  destination: Home Follow up Recommendations: 24 hour supervision/assistance;Home Health SLP Equipment Recommended: None recommended by SLP    SLP Frequency 1 to 3 out of 7 days   SLP Duration  SLP Intensity  SLP Treatment/Interventions 2 weeks  Minumum of 1-2 x/day, 30 to 90 minutes  Cognitive remediation/compensation;Internal/external aids;Dysphagia/aspiration precaution training;Functional tasks;Patient/family education;Therapeutic Activities;Environmental controls    Pain Pain Assessment Pain Scale: 0-10 Pain Score: 0-No pain  Prior Functioning Cognitive/Linguistic Baseline:  (cognitive deficits from previous admission to CIR on 11/09/22) Type of Home: House  Lives With: Alone Available Help at Discharge: Other (Comment) (TBD) Education: High school Vocation: Retired  Programmer, systems Overall Cognitive Status: Impaired/Different from baseline Arousal/Alertness: Lethargic Orientation Level: Oriented to person;Oriented to place;Oriented to situation Year: 2024 Month: January Day of Week: Incorrect Attention: Focused;Sustained Focused Attention: Appears intact Sustained Attention: Impaired Sustained Attention Impairment: Verbal basic;Functional basic Selective Attention: Impaired Selective Attention Impairment: Verbal basic;Functional basic Memory: Impaired Memory Impairment: Storage deficit;Retrieval deficit;Decreased recall of new information;Decreased short term memory Decreased Short Term Memory: Verbal basic;Functional basic Awareness: Impaired Awareness Impairment: Intellectual impairment;Emergent impairment Problem Solving: Impaired Problem Solving Impairment: Functional basic;Verbal basic Executive Function:  (lower level deficits impact all executive functions) Behaviors: Perseveration Safety/Judgment: Impaired  Comprehension Auditory Comprehension Overall Auditory Comprehension: Impaired Interfering Components: Attention;Working  memory Expression Expression Primary Mode of Expression: Verbal Verbal Expression Overall Verbal Expression: Appears within functional limits for tasks assessed Oral Motor Oral Motor/Sensory Function Overall Oral Motor/Sensory Function: Mild impairment Facial ROM: Reduced left;Suspected CN VII (facial) dysfunction Facial Symmetry: Abnormal symmetry left;Suspected CN VII (facial) dysfunction Facial Strength: Reduced left;Suspected CN VII (facial) dysfunction Facial Sensation: Reduced left;Suspected CN V (Trigeminal) dysfunction Lingual ROM: Reduced left;Suspected CN XII (hypoglossal) dysfunction Lingual Strength: Reduced Velum: Within Functional Limits Mandible: Within Functional Limits Motor Speech Overall Motor Speech: Appears within functional limits for tasks assessed Intelligibility: Intelligible  Care Tool Care Tool Cognition Ability to hear (with hearing aid or hearing appliances if normally used Ability to hear (with hearing aid or hearing appliances if normally used): 0. Adequate - no difficulty in normal conservation, social interaction, listening to TV   Expression of Ideas and Wants Expression of Ideas and Wants: 3. Some difficulty - exhibits some difficulty with expressing needs and ideas (e.g, some words or finishing thoughts) or speech is not clear   Understanding Verbal and Non-Verbal Content Understanding Verbal and Non-Verbal Content: 3. Usually understands - understands most conversations, but misses some part/intent of message. Requires cues at times to understand  Memory/Recall Ability Memory/Recall Ability : Current season;That he or she is in a hospital/hospital unit   PMSV Assessment  PMSV Trial Intelligibility: Intelligible - slight impairment. Pt compensates well and is intelligible at the conversation level  Bedside Swallowing Assessment General Date of Onset: 11/23/22 Previous Swallow Assessment: MBS11/27/23, BSE 11/09/22, BSE 12/06/22 Diet Prior to  this Study: Dysphagia 3 (soft);Thin liquids Temperature Spikes Noted: No History of Recent Intubation: No Behavior/Cognition: Cooperative;Pleasant mood Oral Cavity - Dentition: Missing dentition Self-Feeding Abilities: Able to feed self;Needs set up Vision: Functional for self-feeding Patient Positioning: Upright in bed Baseline Vocal Quality: Normal Volitional Cough:  (fair) Volitional Swallow: Able to elicit  Oral Care Assessment Oral Assessment  (WDL): Exceptions to WDL Lips: Asymmetrical Teeth: Missing (Comment) Tongue: Pink;Moist Mucous Membrane(s): Moist;Pink Saliva: Moist, saliva free  flowing Level of Consciousness: Alert Is patient on any of following O2 devices?: None of the above Nutritional status: Dysphagia Oral Assessment Risk : High Risk Ice Chips Ice chips: Not tested Thin Liquid Thin Liquid: Within functional limits Presentation: Cup;Straw Nectar Thick Nectar Thick Liquid: Not tested Honey Thick Honey Thick Liquid: Not tested Puree Puree: Within functional limits Presentation: Self Fed;Spoon Solid Solid: Impaired Presentation: Self Fed Oral Phase Functional Implications: Left anterior spillage BSE Assessment Risk for Aspiration Impact on safety and function: Mild aspiration risk Other Related Risk Factors: Previous CVA;History of dysphagia  Short Term Goals: Week 1: SLP Short Term Goal 1 (Week 1): Patient will identify safe vs. unsafe scenarios with mod A verbal and/or visual cues to achieve 75% accuracy SLP Short Term Goal 2 (Week 1): Patient will generate appropriate solutions to problems with mod A verbal and/or visual cues to achieve 75% accuracy SLP Short Term Goal 3 (Week 1): Patient will be given specific scenarios (verbal and/or visual) and anticipate up to 2 ways to minimize unsafe outcomes with mod A verbal and/or visual cues SLP Short Term Goal 4 (Week 1): Patient will sustain attention to functional tasks for 3 minute duration with mod A  verbal redirection cues SLP Short Term Goal 5 (Week 1): Pt will consume current diet with minimal overt s/sx of aspiration and improved self monitoring of left anterior spillage with min A reminder cues  Refer to Care Plan for Long Term Goals  Recommendations for other services: Neuropsych and Therapeutic Recreation  Pet therapy and Stress management  Discharge Criteria: Patient will be discharged from SLP if patient refuses treatment 3 consecutive times without medical reason, if treatment goals not met, if there is a change in medical status, if patient makes no progress towards goals or if patient is discharged from hospital.  The above assessment, treatment plan, treatment alternatives and goals were discussed and mutually agreed upon: by patient  Patty Sermons 12/12/2022, 3:28 PM

## 2022-12-11 NOTE — Plan of Care (Signed)
  Problem: RH Balance Goal: LTG Patient will maintain dynamic sitting balance (PT) Description: LTG:  Patient will maintain dynamic sitting balance with assistance during mobility activities (PT) Flowsheets (Taken 12/11/2022 2143) LTG: Pt will maintain dynamic sitting balance during mobility activities with:: Independent with assistive device  Goal: LTG Patient will maintain dynamic standing balance (PT) Description: LTG:  Patient will maintain dynamic standing balance with assistance during mobility activities (PT) Flowsheets (Taken 12/11/2022 2143) LTG: Pt will maintain dynamic standing balance during mobility activities with:: Contact Guard/Touching assist   Problem: Sit to Stand Goal: LTG:  Patient will perform sit to stand with assistance level (PT) Description: LTG:  Patient will perform sit to stand with assistance level (PT) Flowsheets (Taken 12/11/2022 2143) LTG: PT will perform sit to stand in preparation for functional mobility with assistance level: Supervision/Verbal cueing   Problem: RH Bed Mobility Goal: LTG Patient will perform bed mobility with assist (PT) Description: LTG: Patient will perform bed mobility with assistance, with/without cues (PT). Flowsheets (Taken 12/11/2022 2143) LTG: Pt will perform bed mobility with assistance level of: Supervision/Verbal cueing   Problem: RH Bed to Chair Transfers Goal: LTG Patient will perform bed/chair transfers w/assist (PT) Description: LTG: Patient will perform bed to chair transfers with assistance (PT). Flowsheets (Taken 12/11/2022 2143) LTG: Pt will perform Bed to Chair Transfers with assistance level: Supervision/Verbal cueing   Problem: RH Car Transfers Goal: LTG Patient will perform car transfers with assist (PT) Description: LTG: Patient will perform car transfers with assistance (PT). Flowsheets (Taken 12/11/2022 2143) LTG: Pt will perform car transfers with assist:: Supervision/Verbal cueing   Problem: RH Ambulation Goal:  LTG Patient will ambulate in controlled environment (PT) Description: LTG: Patient will ambulate in a controlled environment, # of feet with assistance (PT). Flowsheets (Taken 12/11/2022 2143) LTG: Pt will ambulate in controlled environ  assist needed:: Supervision/Verbal cueing LTG: Ambulation distance in controlled environment: 151f using LRAD Goal: LTG Patient will ambulate in home environment (PT) Description: LTG: Patient will ambulate in home environment, # of feet with assistance (PT). Flowsheets (Taken 12/11/2022 2143) LTG: Pt will ambulate in home environ  assist needed:: Supervision/Verbal cueing LTG: Ambulation distance in home environment: 533fusing LRAD   Problem: RH Stairs Goal: LTG Patient will ambulate up and down stairs w/assist (PT) Description: LTG: Patient will ambulate up and down # of stairs with assistance (PT) Flowsheets (Taken 12/11/2022 2143) LTG: Pt will ambulate up/down stairs assist needed:: Contact Guard/Touching assist LTG: Pt will  ambulate up and down number of stairs: at least 2 steps using BHRs per home set-up

## 2022-12-11 NOTE — Plan of Care (Signed)
  Problem: Sit to Stand Goal: LTG:  Patient will perform sit to stand in prep for activites of daily living with assistance level (OT) Description: LTG:  Patient will perform sit to stand in prep for activites of daily living with assistance level (OT) Flowsheets (Taken 12/11/2022 1714) LTG: PT will perform sit to stand in prep for activites of daily living with assistance level: Supervision/Verbal cueing   Problem: RH Grooming Goal: LTG Patient will perform grooming w/assist,cues/equip (OT) Description: LTG: Patient will perform grooming with assist, with/without cues using equipment (OT) Flowsheets (Taken 12/11/2022 1714) LTG: Pt will perform grooming with assistance level of: Supervision/Verbal cueing   Problem: RH Bathing Goal: LTG Patient will bathe all body parts with assist levels (OT) Description: LTG: Patient will bathe all body parts with assist levels (OT) Flowsheets (Taken 12/11/2022 1714) LTG: Pt will perform bathing with assistance level/cueing: Contact Guard/Touching assist LTG: Position pt will perform bathing: Shower   Problem: RH Dressing Goal: LTG Patient will perform lower body dressing w/assist (OT) Description: LTG: Patient will perform lower body dressing with assist, with/without cues in positioning using equipment (OT) Flowsheets (Taken 12/11/2022 1714) LTG: Pt will perform lower body dressing with assistance level of: Supervision/Verbal cueing   Problem: RH Toileting Goal: LTG Patient will perform toileting task (3/3 steps) with assistance level (OT) Description: LTG: Patient will perform toileting task (3/3 steps) with assistance level (OT)  Flowsheets (Taken 12/11/2022 1714) LTG: Pt will perform toileting task (3/3 steps) with assistance level: Minimal Assistance - Patient > 75%   Problem: RH Tub/Shower Transfers Goal: LTG Patient will perform tub/shower transfers w/assist (OT) Description: LTG: Patient will perform tub/shower transfers with assist, with/without  cues using equipment (OT) Flowsheets (Taken 12/11/2022 1714) LTG: Pt will perform tub/shower stall transfers with assistance level of: Minimal Assistance - Patient > 75%   Problem: RH Memory Goal: LTG Patient will demonstrate ability for day to day recall/carry over during activities of daily living with assistance level (OT) Description: LTG:  Patient will demonstrate ability for day to day recall/carry over during activities of daily living with assistance level (OT). Flowsheets (Taken 12/11/2022 1714) LTG:  Patient will demonstrate ability for day to day recall/carry over during activities of daily living with assistance level (OT): Supervision   Problem: RH Attention Goal: LTG Patient will demonstrate this level of attention during functional activites (OT) Description: LTG:  Patient will demonstrate this level of attention during functional activites  (OT) Flowsheets (Taken 12/11/2022 1714) Patient will demonstrate this level of attention during functional activites: Sustained Patient will demonstrate above attention level in the following environment: Home LTG: Patient will demonstrate this level of attention during functional activites (OT): Supervision   Problem: RH Awareness Goal: LTG: Patient will demonstrate awareness during functional activites type of (OT) Description: LTG: Patient will demonstrate awareness during functional activites type of (OT) Flowsheets (Taken 12/11/2022 1714) Patient will demonstrate awareness during functional activites type of: Emergent LTG: Patient will demonstrate awareness during functional activites type of (OT): Supervision

## 2022-12-12 DIAGNOSIS — I6329 Cerebral infarction due to unspecified occlusion or stenosis of other precerebral arteries: Secondary | ICD-10-CM | POA: Diagnosis not present

## 2022-12-12 MED ORDER — SENNOSIDES-DOCUSATE SODIUM 8.6-50 MG PO TABS
3.0000 | ORAL_TABLET | Freq: Two times a day (BID) | ORAL | Status: DC
  Administered 2022-12-12 – 2022-12-19 (×15): 3 via ORAL
  Filled 2022-12-12 (×15): qty 3

## 2022-12-12 MED ORDER — MIDODRINE HCL 5 MG PO TABS
5.0000 mg | ORAL_TABLET | Freq: Three times a day (TID) | ORAL | Status: DC
  Administered 2022-12-13 – 2022-12-17 (×15): 5 mg via ORAL
  Filled 2022-12-12 (×15): qty 1

## 2022-12-12 NOTE — Progress Notes (Signed)
Occupational Therapy Session Note  Patient Details  Name: Leslie [name] MRN: [id number] Date of Birth: [birth year]  Today's Date: 12/12/2022 OT Individual Time: 9924-2683 OT Individual Time Calculation (min): 56 min    Short Term Goals: Week 1:  OT Short Term Goal 1 (Week 1): Pt will attend to L visual field/side of body during functional activities with min verbal cues OT Short Term Goal 2 (Week 1): Pt will tolerate standing using LRAD during functional activties/BADLs for 3 minutes OT Short Term Goal 3 (Week 1): Pt will complete toilet transfer CGA using LRAD  Skilled Therapeutic Interventions/Progress Updates:     Pt received sitting up in chair with TED hose donned. Pt reporting 0/10 pain and presenting anxious. Pt stating "I am getting aggitated, I need to do something" upon OT arrival and reporting nausea symptoms. Pt provided therapeutic listening and support with improvement noted in moral.   Vitals assessed in sitting: BP 96/81 (88) HR 73 PSO2 100%. Pt instructed to complete ankle pumps and legs elevated on recliner foot rest. Vitals assessed again in sitting: BP 110/62 (77). Sit>stand using RW min A with mod verbal cues required for hand placement and body mechanics. Vitals assessed in standing: BP 75/57 (64) HR 90 SPO2 69. Pt reporting increase in nausea in standing. Pt spontaneously returning to sitting- OT propped legs with Pt reporting decrease in symptoms. Pt receptive to functional mobility in room. Pt ambulated across room to wc using RW CGA. Vitals Assessed in standing following ambulation: BP 80/69 (73) HR 78. Upon arriving to wc, Pt pointed to the bed stating "the dog's face is covered in plastic, we need to check on the dog before we leave." OT reoriented Pt and allowed Pt time to investigate the bed for reassurance there was not a dog in danger.   Pt propelled her wc down hallway to therapy gym for endurance training, to increase attention to left side, and to increase  functional use of LUE. Pt required mod verbal cues and min A +time to manipulate wc to avoid obstacles and the wall on Pt left side. Pt self-efficacy noted to be improved following activity.   Pt completed therapeutic task of writing down three things she is thankful for on a heart (sticky note) while standing at elevated table. Pt able to tolerate ~2 minutes of standing during task before requiring a rest break d/t nausea symptoms. Unable to gather vitals in standing d/t equipment malfunction. Pt stood to place sticky note on wall and able to maintain standing balance ~1.5 minutes before sitting.   Pt transported back to room total A in wc. Pt ambulated able to ambulate within her room to her recliner using RW CGA +time. Upon sitting in recliner, Pt pointing to the door stating "there's my grand daughter, you should say hi to her." However, no one was standing at the door. OT reoriented Pt and provided therapeutic support. Pt was left resting in wc with call bell in reach, bed alarm on, and all needs met.   Therapy Documentation Precautions:  Precautions Precautions: Fall, Other (comment) Precaution Comments: orthostatic, TEDs and ABD binder Restrictions Weight Bearing Restrictions: No Other Position/Activity Restrictions: TED hose, abdominal binder, ace wraps General:   Vital Signs:  Pain: Pain Assessment Pain Scale: 0-10 Pain Score: 0-No pain ADL: ADL Eating: Supervision/safety Where Assessed-Eating: Bed level Grooming: Moderate assistance Where Assessed-Grooming: Bed level Upper Body Bathing: Minimal assistance Where Assessed-Upper Body Bathing: Bed level Lower Body Bathing: Moderate assistance Where Assessed-Lower Body Bathing:  Bed level Upper Body Dressing: Minimal assistance Where Assessed-Upper Body Dressing: Bed level Lower Body Dressing: Minimal assistance Where Assessed-Lower Body Dressing: Bed level Toileting: Moderate assistance Where Assessed-Toileting: Bedside  Commode Toilet Transfer: Minimal assistance Toilet Transfer Method: Stand pivot Toilet Transfer Equipment: Engineer, technical sales Transfer: Not assessed Social research officer, government: Not assessed Vision   Perception    Praxis   Balance   Exercises:   Other Treatments:     Therapy/Group: Individual Therapy  Janey Genta 12/12/2022, 11:21 AM

## 2022-12-12 NOTE — Progress Notes (Signed)
Physical Therapy Session Note  Patient Details  Name: Leslie [name] MRN: [id number] Date of Birth: 12-24-1940  Today's Date: 12/12/2022 PT Individual Time: [phone] and  2831-5176 PT Individual Time Calculation (min): 73 min and 76 min   Short Term Goals: Week 1:  PT Short Term Goal 1 (Week 1): Pt will perform supine<>sit with supervision consistently PT Short Term Goal 2 (Week 1): Pt will perform sit<>stands using LRAD with CGA consistently PT Short Term Goal 3 (Week 1): Pt will perform bed<>chair transfers using LRAD with CGA consistently PT Short Term Goal 4 (Week 1): Pt will ambulate at least 165f using LRAD with CGA PT Short Term Goal 5 (Week 1): Pt will navigate 4 steps using HRs with CGA  Skilled Therapeutic Interventions/Progress Updates:    Session 1: Pt received sitting EOB finishing eating breakfast and agreeable to therapy session. Pt reports she took care of her dogs all night last night; however, when asked, pt is oriented to the fact that she is in the hospital and therapist educated that her dogs could not spend the night in the hospital, but pt reporting she remembers petting and caring for them.  Able to easily redirect pt to task at hand and plan to participate in therapy.  Vitals sitting EOB: BP 103/69 (MAP 79), HR 75bpm  Donned knee high TED hose total assist.  Sitting EOB, with no balance instability, threaded on pants set-up assist.  Sit>stand EOB>RW with CGA and standing with CGA pulled pants up over hips with min assist for L side. Pt spontaneously returned to sitting so reassessed vitals: BP 110/77 (MAP 87), HR 85bpm   L stand pivot EOB>w/c using RW with CGA for steadying.  Pt noted to have oral residue from eating breakfast therefore performed seated oral care at sink with set-up assist and max cuing for cleanliness as pt with poor awareness to residual food in her mouth.    Transported to/from gym in w/c for time management and energy  conservation.  MD in/out for morning assessment.  Sit>stand w/c>RW with CGA/min assist for rising to stand and cuing to increase anterior trunk lean to improve ability to rise - repeated x2 to reinforce improved mechanics.  Gait training ~797fusing RW with CGA for steadying - slow gait speed, decreased B LE step lengths, slight forward trunk flexed at hips causing increased BUE WBing down through AD - cuing throughout for improvement.  Vitals in sitting after gait: BP 107/66 (MAP 78), HR 81bpm   Gait training ~7324f3 (seated breaks between), with only L HHA, and light min assist for balance - pt demos increasing gait speed and increasing B LE step lengths as well as improving upright posture but more impaired balance with 1x L LOB when looking R at something in environment requiring heavier min assist to recover; however, overall has more consistent minor R lean.  Pt continues to start initiating sitting before turning fully. Therapist provided demonstration of proper sequencing to turn fully and then reach back for armrest rather than reaching for armrest while still facing the seat. Block practice stand pivot transfers w/c<>standard chair x3 reps with pt demoing improved ability to side step towards chair and turn hips fully prior to reaching back to armrest and initiating sitting.   Transported back to room.   Short distance ~24f32fbulatory transfer w/c>recliner using L HHA with min assist for balance.  Pt left seated in recliner with needs in reach and seat belt alarm on.   Session  2: Prior to therapy session, therapist stopped by pt's room due to hearing seat belt alarm going off. Pt upset she is sitting in her room alone - therapist provided emotional support and educated pt that she is in her personal hospital room, therapist assisted pt with calling her daughter, Jeannene Patella, who states she will be to the hospital shortly. Pt left in recliner with seat belt alarm on, talking on the phone to her  daughter.   Pt received supine in bed with her daughter, Jeannene Patella, and granddaughter, Willadean Carol, present and pt upset that this therapist left her alone in the room earlier. Therapist provided education on therapy schedule and reasoning for allowing pt to remain up in recliner due to this therapist returning for this session in ~45 minutes. Pt required encouragement to participate in this therapy session to which pt was eventually agreeable. Offered alternative option of pt sitting at nurses station during long breaks between therapy sessions in the day and pt agreeable - nurse notified.  Therapist made pt's daughter, Jeannene Patella, aware of pt thinking she took care of her dogs last night - pt's daughter confirms she did bring pt's dog by hospital and pt did call Pam at midnight to check on her dogs - pt's daughter confirms this hallucination/confusion about her dogs has been present. However, during session once in therapy gym, pt had ~3 instances of where she was talking to her family despite her family returning to her room and not being in the gym - therapist oriented pt to this as she was unaware they were no longer present.  Supine>sitting R EOB with min assist for B LE management off EOB and trunk upright due to pt continuing to require gentle encouragement to participate. Pt already wearing knee high TED hose. Donned shoes total assist.  Pt excited to show family her gait without AD. Sit>stand EOB>L HHA with min assist for rising and balance in standing.   Gait training ~43f + ~890fusing L HHA with pt's family providing w/c follow in event of fatigue - requires min assist for balance as pt continues to have minor R lean - pt is able to perform R UE arm swing and has decreased gait speed with short step lengths although reciprocal. Requires seated rest breaks between and vitals assessed after each walk: 1st: BP 111/70 (MAP 82), HR 77bpm after 2nd: BP 101/68 (MAP 79), HR 79bpm   Dynamic standing balance task of  alternating foot taps to 4" step 2x10reps with L HHA and min assist for balance. Pt needing to sit down and appears to have decrease in energy levels (presenting similarly to how she did during last CIR stay when she had orthostatic hypotension); therefore, reassessed vitals in sitting: BP 87/49 (MAP 57), HR 82bpm   Provided longer seated rest break and reassessed vitals: BP 94/66 (MAP 75), HR 82bpm performed seated overhead BUE exercises for BP recovery and reassessed vitals in sitting: BP 90/55 (MAP 65), HR 77bpm (no improvement)   Donned thigh high TED hose and reassessed vitals in sitting: BP 103/70 (MAP 80), HR 82bpm  Transitioned to standing to perform orthostatic vital assessment; however, pt unable to remain standing long enough for dynamap to obtain reading due to onset of nausea and pt needing to sit down (same presentation pt had during last CIR stay when she was having orthostatic hypotension) Dynamap finished obtaining reading in sitting: BP 85/67 (MAP 75), HR 78bpm therefore donned abdominal binder.  Transported back to her room. Pt requesting to  remain sitting up in w/c to visit with her granddaughter. Reassessed vitals to ensure this was safe: BP 106/67 (MAP 87), HR 75bpm   Pt left seated in w/c with needs in reach, thigh high TEDs and abdominal binder in place, seat belt alarm on, and her family present.  Therapy Documentation Precautions:  Precautions Precautions: Fall, Other (comment) Precaution Comments: orthostatic, TEDs and ABD binder Restrictions Weight Bearing Restrictions: No Other Position/Activity Restrictions: TED hose, abdominal binder, ace wraps   Pain: Session 1: No reports of pain throughout session.  Session 2: No reports of pain throughout session.    Therapy/Group: Individual Therapy  Tawana Scale , PT, DPT, NCS, CSRS 12/12/2022, 7:43 AM

## 2022-12-12 NOTE — Progress Notes (Signed)
Inpatient Rehabilitation Care Coordinator Assessment and Plan Patient Details  Name: Leslie [name] MRN: [id number] Date of Birth: 1941/04/08  Today's Date: 12/12/2022  Hospital Problems: Principal Problem:   Cerebrovascular accident (CVA) due to occlusion of right posterior communicating artery [facility])  Past Medical History:  Past Medical History:  Diagnosis Date   Arthritis    Back pain due to injury    fell off a ladder and fractured back-T12 down (multiple surgeries w/ onging pain)   Chronic kidney disease    "stage 3 kidney failure" improving per pt   Hypothyroidism    Insomnia    Plantar fasciitis    Stomach ulcer ~2000   Stroke [facility])    TIA X2   2001, jan 2017   Vertigo, benign paroxysmal, bilateral    Vitamin D deficiency    Past Surgical History:  Past Surgical History:  Procedure Laterality Date   ABDOMINAL HYSTERECTOMY  1974   ANKLE FUSION Right 06/07/2016   Procedure: RIGHT TALONAVICULAR ARTHRODESIS ;  Surgeon: Wylene Simmer, MD;  Location: [place];  Service: Orthopedics;  Laterality: Right;   APPENDECTOMY     BACK SURGERY     x4  fusion T12 thru sacrum; U rod in sacrum   BUBBLE STUDY  11/26/2022   Procedure: BUBBLE STUDY;  Surgeon: Elouise Munroe, MD;  Location: [place];  Service: Cardiovascular;;   CARDIOVERSION N/A 11/26/2022   Procedure: CARDIOVERSION;  Surgeon: Elouise Munroe, MD;  Location: [facility] ENDOSCOPY;  Service: Cardiovascular;  Laterality: N/A;   CARDIOVERSION N/A 12/05/2022   Procedure: CARDIOVERSION;  Surgeon: Pixie Casino, MD;  Location: [facility] ENDOSCOPY;  Service: Cardiovascular;  Laterality: N/A;   CATARACT EXTRACTION, BILATERAL Bilateral 2015   GASTROCNEMIUS RECESSION Right 06/07/2016   Procedure: RIGHT GASTROC RECESSION ;  Surgeon: Wylene Simmer, MD;  Location: [place];  Service: Orthopedics;  Laterality: Right;   HAND SURGERY Left 2003   x2   IR ANGIO EXTRACRAN SEL COM CAROTID INNOMINATE UNI L MOD  SED  11/03/2022   IR CT HEAD LTD  11/03/2022   IR PERCUTANEOUS ART THROMBECTOMY/INFUSION INTRACRANIAL INC DIAG ANGIO  11/03/2022   L heel surgery for torn tendon Left 2015   PLANTAR FASCIA SURGERY Right 2009   surgery at [place] 11/03/2022   Procedure: IR WITH ANESTHESIA;  Surgeon: Radiologist, Medication, MD;  Location: [place];  Service: Radiology;  Laterality: N/A;   SHOULDER ARTHROSCOPY Right 2007   TEE WITHOUT CARDIOVERSION N/A 11/26/2022   Procedure: TRANSESOPHAGEAL ECHOCARDIOGRAM (TEE);  Surgeon: Elouise Munroe, MD;  Location: [facility] ENDOSCOPY;  Service: Cardiovascular;  Laterality: N/A;   TENDON REPAIR Right 1949   foot- lacerated tendon in arch of foot   THORACENTESIS N/A 10/30/2022   Procedure: THORACENTESIS;  Surgeon: Julian Hy, DO;  Location: [place];  Service: Cardiopulmonary;  Laterality: N/A;   TOTAL HIP ARTHROPLASTY Left 10/05/2021   Procedure: TOTAL HIP ARTHROPLASTY ANTERIOR APPROACH;  Surgeon: Rod Can, MD;  Location: [facility];  Service: Orthopedics;  Laterality: Left;   Social History:  reports that she quit smoking about 36 years ago. Her smoking use included cigarettes. She has never used smokeless tobacco. She reports that she does not drink alcohol and does not use drugs.  Family / Support Systems Children: Daughter Other Supports: Brother, Agricultural consultant, SIL and grandchildren Anticipated Caregiver: Daughter, SIL, Bother, Grandchildren and hired assistance Ability/Limitations of Caregiver: Daughter anticipates retuning home; family anticipated to provide 24/7. Daughter thinking about  hiring [facility] (Resources provided) Caregiver Availability: 24/7 Family Dynamics: children, SIL, siblings, neighbors, grandchildren and other family  Social History Preferred language: English Religion: Curator - How often do you need to have someone help you when you read instructions, pamphlets, or other written material from your  doctor or pharmacy?: Often Writes: Yes   Abuse/Neglect Abuse/Neglect Assessment Can Be Completed: Yes Physical Abuse: Denies Verbal Abuse: Denies Sexual Abuse: Denies Exploitation of patient/patient's resources: Denies Self-Neglect: Denies  Patient response to: Social Isolation - How often do you feel lonely or isolated from those around you?: Rarely  Emotional Status Recent Psychosocial Issues: coping Psychiatric History: N/A Substance Abuse History: N/A  Patient / Family Perceptions, Expectations & Goals Pt/Family understanding of illness & functional limitations: yes Premorbid pt/family roles/activities: Independent Anticipated changes in roles/activities/participation: Daughter , SIL, grandchildren, other family memebers and daughter anticipating hiring [facility] Pt/family expectations/goals: MOD I to Supervision  Community Resources Premorbid Home Care/DME Agencies: Other (Comment) ([facility], [facility] , Civil engineer, contracting) Transportation available at discharge: daughter or other family members Is the patient able to respond to transportation needs?: Yes In the past 12 months, has lack of transportation kept you from medical appointments or from getting medications?: No In the past 12 months, has lack of transportation kept you from meetings, work, or from getting things needed for daily living?: No Resource referrals recommended: Neuropsychology  Discharge Planning Living Arrangements: Alone Support Systems: Children, Other relatives, Friends/neighbors Type of Residence: Private residence Insurance Resources: Multimedia programmer (specify) Financial Resources: Social Security Financial Screen Referred: No Living Expenses: Own Money Management: Patient Does the patient have any problems obtaining your medications?: No Home Management: Independent Patient/Family Preliminary Plans: Daughter and family able to assist with cogntive tasks Care Coordinator Barriers to Discharge: Decreased caregiver  support, Insurance for SNF coverage Care Coordinator Anticipated Follow Up Needs: HH/OP Expected length of stay: 5-7 Days  Clinical Impression Readmission. Sw met with patient and spoke with daughter, Leslie [name]. Daughter still anticipates that patient will discharge home with assistance from family and potentially hired caregivers. Daughter has [place] resources from previous admission.  No additional questions or concerns.  Dyanne Iha 12/12/2022, 2:34 PM

## 2022-12-12 NOTE — Progress Notes (Signed)
PROGRESS NOTE   Subjective/Complaints:  No issues overnite  Pt excited to go to PT   ROS:  Denies abd pain, N/V, no breathing issues   Objective:   No results found. Recent Labs    12/10/22 0147 12/11/22 0546  WBC 9.7 9.5  HGB 12.1 12.3  HCT 39.1 39.3  PLT 241 228    Recent Labs    12/10/22 0147 12/11/22 0546  NA 139 138  K 3.8 3.8  CL 101 102  CO2 27 27  GLUCOSE 96 104*  BUN 11 15  CREATININE 1.09* 1.30*  CALCIUM 9.8 9.3     Intake/Output Summary (Last 24 hours) at 12/12/2022 0840 Last data filed at 12/11/2022 1816 Gross per 24 hour  Intake 357 ml  Output --  Net 357 ml      Pressure Injury 12/10/22 Buttocks Right Stage 2 -  Partial thickness loss of dermis presenting as a shallow open injury with a red, pink wound bed without slough. Pink, open (Active)  12/10/22 1936  Location: Buttocks  Location Orientation: Right  Staging: Stage 2 -  Partial thickness loss of dermis presenting as a shallow open injury with a red, pink wound bed without slough.  Wound Description (Comments): Pink, open  Present on Admission: Yes    Physical Exam: Vital Signs Blood pressure (!) 123/58, pulse 62, temperature 97.6 F (36.4 C), resp. rate 18, height _0  (1.702 m), weight 64 kg, SpO2 100 %.   General: No acute distress Mood and affect are appropriate Heart: Regular rate and rhythm no rubs murmurs or extra sounds Lungs: Clear to auscultation, breathing unlabored, no rales or wheezes Abdomen: Positive bowel sounds, soft nontender to palpation, nondistended Extremities: No clubbing, cyanosis, or edema Skin: No evidence of breakdown, no evidence of rash Neurologic: Cranial nerves II through XII intact, motor strength is 4/5 in bilateral deltoid, bicep, tricep, grip, hip flexor, knee extensors, ankle dorsiflexor and plantar flexor  Musculoskeletal: Full range of motion in all 4 extremities. No joint  swelling   Assessment/Plan: 1. Functional deficits which require 3+ hours per day of interdisciplinary therapy in a comprehensive inpatient rehab setting. Physiatrist is providing close team supervision and 24 hour management of active medical problems listed below. Physiatrist and rehab team continue to assess barriers to discharge/monitor patient progress toward functional and medical goals  Care Tool:  Bathing    Body parts bathed by patient: Right arm, Left arm, Chest, Abdomen, Front perineal area, Right upper leg, Left upper leg, Face   Body parts bathed by helper: Right lower leg, Buttocks, Left lower leg     Bathing assist Assist Level: Moderate Assistance - Patient 50 - 74%     Upper Body Dressing/Undressing Upper body dressing   What is the patient wearing?: Pull over shirt    Upper body assist Assist Level: Minimal Assistance - Patient > 75%    Lower Body Dressing/Undressing Lower body dressing      What is the patient wearing?: Pants     Lower body assist Assist for lower body dressing: Moderate Assistance - Patient 50 - 74%     Toileting Toileting    Toileting assist Assist for  toileting: Moderate Assistance - Patient 50 - 74%     Transfers Chair/bed transfer  Transfers assist     Chair/bed transfer assist level: Minimal Assistance - Patient > 75% Chair/bed transfer assistive device: Programmer, multimedia   Ambulation assist      Assist level: Minimal Assistance - Patient > 75% Assistive device: Walker-rolling Max distance: 27f   Walk 10 feet activity   Assist     Assist level: Minimal Assistance - Patient > 75% Assistive device: Walker-rolling   Walk 50 feet activity   Assist    Assist level: Minimal Assistance - Patient > 75% Assistive device: Walker-rolling    Walk 150 feet activity   Assist Walk 150 feet activity did not occur: Safety/medical concerns (impaired endurance)         Walk 10 feet on uneven  surface  activity   Assist Walk 10 feet on uneven surfaces activity did not occur: Safety/medical concerns         Wheelchair     Assist Is the patient using a wheelchair?: Yes (used for transportation) Type of Wheelchair: Manual    Wheelchair assist level: Dependent - Patient 0%      Wheelchair 50 feet with 2 turns activity    Assist        Assist Level: Dependent - Patient 0%   Wheelchair 150 feet activity     Assist      Assist Level: Dependent - Patient 0%   Blood pressure (!) 123/58, pulse 62, temperature 97.6 F (36.4 C), resp. rate 18, height _0  (1.702 m), weight 64 kg, SpO2 100 %.  Medical Problem List and Plan: 1. 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 Formulized individual treatment plan based on medical history, underlying problem and comorbidities. 2.  Antithrombotics: -DVT/anticoagulation:  Pharmaceutical: Eliquis             -antiplatelet therapy: N/A 3. Pain Management: Tylenol as needed 4. Mood/Behavior/Sleep: Provide emotional support             -antipsychotic agents: N/A 5. Neuropsych/cognition: This patient is capable of making decisions on her own behalf. 6. Skin/Wound Care: Routine skin checks 7. Fluids/Electrolytes/Nutrition: Routine in and outs with follow-up chemistries 8.  Atrial fibrillation.  Continue amiodarone 200 mg daily.  Eliquis as directed.  Cardiac rate controlled 9.  Orthostasis.  ProAmatine 5 mg twice daily.  Monitor with increased mobility 10.  Hypothyroidism.  Synthroid  11.  Constipation- benign abd exam, on colace, add Miralax   LOS: 2 days A FACE TO FACE EVALUATION WAS PERFORMED  ACharlett Blake1/02/2023, 8:40 AM

## 2022-12-12 NOTE — Progress Notes (Signed)
Patient ID: Leslie [name], female   DOB: 29-Nov-1941, 82 y.o.   MRN: [id number] Team Conference Report to Patient/Family  Team Conference discussion was reviewed with the patient and caregiver, including goals, any changes in plan of care and target discharge date.  Patient and caregiver express understanding and are in agreement.  The patient has a target discharge date of 12/26/22.  SW spoke with patient daughter, [name] and provided conference updates. Daughter anticipates patient will discharge home with family to assist 24/7. Daughter potentially hiring caregivers. No additional questions or concerns.  Dyanne Iha 12/12/2022, 2:04 PM

## 2022-12-12 NOTE — Plan of Care (Signed)
  Problem: RH Swallowing Goal: LTG Patient will consume least restrictive diet using compensatory strategies with assistance (SLP) Description: LTG:  Patient will consume least restrictive diet using compensatory strategies with assistance (SLP) Flowsheets (Taken 12/12/2022 0802) LTG: Pt Patient will consume least restrictive diet using compensatory strategies with assistance of (SLP): Supervision   Problem: RH Problem Solving Goal: LTG Patient will demonstrate problem solving for (SLP) Description: LTG:  Patient will demonstrate problem solving for basic/complex daily situations with cues  (SLP) Flowsheets (Taken 12/12/2022 0802) LTG: Patient will demonstrate problem solving for (SLP): Basic daily situations LTG Patient will demonstrate problem solving for: Minimal Assistance - Patient > 75%   Problem: RH Attention Goal: LTG Patient will demonstrate this level of attention during functional activites (SLP) Description: LTG:  Patient will will demonstrate this level of attention during functional activites (SLP) Flowsheets (Taken 12/12/2022 0802) Patient will demonstrate during cognitive/linguistic activities the attention type of: Sustained Patient will demonstrate this level of attention during cognitive/linguistic activities in: Controlled LTG: Patient will demonstrate this level of attention during cognitive/linguistic activities with assistance of (SLP): Minimal Assistance - Patient > 75%   Problem: RH Awareness Goal: LTG: Patient will demonstrate awareness during functional activites type of (SLP) Description: LTG: Patient will demonstrate awareness during functional activites type of (SLP) Flowsheets (Taken 12/12/2022 0802) Patient will demonstrate during cognitive/linguistic activities awareness type of: Emergent LTG: Patient will demonstrate awareness during cognitive/linguistic activities with assistance of (SLP): Minimal Assistance - Patient > 75%

## 2022-12-12 NOTE — Patient Care Conference (Signed)
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 Alysia Penna Social Worker Present: Erlene Quan, BSW Nurse Present: Dorien Chihuahua, RN PT Present: Page Spiro, PT OT Present: Other (comment) Lowella Fairy, OT) SLP Present: Sherren Kerns, SLP PPS Coordinator present : Gunnar Fusi, SLP     Current Status/Progress Goal Weekly Team Focus  Bowel/Bladder   Pt is continent/incontinent of b/b   Regain full continence of b/b   Assist with time toileting qshift and prn    Swallow/Nutrition/ Hydration   sup A   sup A  tolerance of current diet with increased awareness of anterior spillage    ADL's   Pt perseverating on need for BM during evaluation, difficult to assess current functional status. Bed mobility- CGA, toilet transfer-min A, U/LB dressing- min A to don gown; decreased strenght in BUE d/t deconditioning; L inattention   Supervision overall, CGA bathing, min A shower transfer and toileting d/t cog deficits   Evaluation, BADL retraining, therapeutic use of self, DME training    Mobility   min assist supine<>sit, min assist sit<>stand and stand pivot transfers using RW, min assist gait up to 80f using RW with noticeable B LE fatigue with muscle shaking, min assist 4  stairs using B HRs - pt demos overall decreased B LE strength impacting functional mobility compared to prior CIR stay; however, improvement in BP with mobility wearing TED hose (and abdominal binder as needed)   supervision overall ambulating  pt education, activity tolerance, BP management, bed mobility training, transfer training, gait training, standing balance, DME training, stair navigation training    Communication                Safety/Cognition/ Behavioral Observations  max A   min A   problem solving, intellectual/emergent awareness, sustained attention    Pain   no c/o pain   Remain pain free   Assess qshift and prn    Skin   pt has a stage 2 on right buttocks   promote proper healing  Assess qshift and prn      Discharge Planning:  Discharging home with daughter/family to provide 24/7   Team Discussion: Patient readmitted post A-fib management after right PCA CVA with vascular dementia history. Orthostasis improved with medication adjustment, TEDs and binder. Patient is easily distracted and perseverative.   Patient on target to meet rehab goals: yes, currently needs CGA - min assist for sit - stand and stand pivot transfers. Able to ambulate up to 745 with CGA. Needs min assist for stair management with rails. Working on problem solving, anticipatory needs, sustained attention, initiation, awareness with supervision goals.   *See Care Plan and progress notes for long and short-term goals.   Revisions to Treatment Plan:  N/A   Teaching Needs: Safety, medications, dietary modifications, transfers,  toileting, etc.  Current Barriers to Discharge: Decreased caregiver support and Home enviroment access/layout  Possible Resolutions to Barriers: Family education Private duty caregiver being addressed by daughter Recommend ramp for entry to home     Medical Summary Current Status: constipation, incont of urine, BPs on low side but no dizziness  Barriers to  Discharge: Hypotension   Possible Resolutions to Barriers/Weekly Focus: knee hi TEDs, on pro amatine for BP support, timed toileting   Continued Need for Acute Rehabilitation Level of Care: The patient requires daily medical management by a physician with specialized training in physical medicine and rehabilitation for the following reasons: Direction of a multidisciplinary physical rehabilitation program to maximize functional independence : Yes Medical management of patient stability for increased activity during participation in an intensive rehabilitation regime.: Yes Analysis of laboratory values and/or radiology reports with any subsequent need for medication adjustment and/or medical intervention. : Yes   I attest that I was present, lead the team conference, and concur with the assessment and plan of the team.   Dorien Chihuahua B 12/12/2022, 2:43 PM

## 2022-12-13 DIAGNOSIS — I6329 Cerebral infarction due to unspecified occlusion or stenosis of other precerebral arteries: Secondary | ICD-10-CM | POA: Diagnosis not present

## 2022-12-13 NOTE — IPOC Note (Signed)
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 ([place])     Functional Problem List: Nursing Bladder, Endurance, Medication Management, Safety, Skin Integrity  PT Balance, Sensory, Skin Integrity, Behavior, Endurance, Motor, Nutrition, Pain, Perception, Safety  OT Balance, Endurance, Perception, Behavior, Motor, Safety, Cognition, Nutrition, Pain, Skin Integrity, Sensory  SLP Cognition, Linguistic, Nutrition, Behavior, Safety  TR         Basic ADL's: OT Grooming, Bathing, Dressing, Toileting     Advanced  ADL's: OT       Transfers: PT Bed Mobility, Bed to Chair, Car, Furniture, Futures trader, Metallurgist: PT Ambulation, Stairs     Additional Impairments: OT    SLP Swallowing, Social Cognition, Communication comprehension Problem Solving, Memory, Awareness, Attention  TR      Anticipated Outcomes Item Anticipated Outcome  Self Feeding Independent  Swallowing      Basic self-care  Supervision  Toileting  Min A   Bathroom Transfers CGA wiht LRAD  Bowel/Bladder  manage bladder w toileting assist  Transfers  supervision using LRAD  Locomotion  supervision using LRAD  Communication     Cognition     Pain  n/a  Safety/Judgment  manage w cues   Therapy Plan: PT Intensity: Minimum of 1-2 x/day ,45 to 90 minutes PT Frequency: 5 out of 7 days PT Duration Estimated Length of Stay: ~2 weeks OT Intensity: Minimum of 1-2 x/day, 45 to 90 minutes OT Frequency: 5 out of 7 days OT Duration/Estimated Length of Stay: 2 weeks SLP Intensity: Minumum of 1-2 x/day, 30 to 90 minutes SLP Frequency: 1 to 3 out of 7 days SLP Duration/Estimated Length of Stay: 2  weeks   Team Interventions: Nursing Interventions Patient/Family Education, Medication Management, Disease Management/Prevention, Bladder Management, Discharge Planning, Skin Care/Wound Management  PT interventions Ambulation/gait training, Community reintegration, DME/adaptive equipment instruction, Neuromuscular re-education, Psychosocial support, Stair training, UE/LE Strength taining/ROM, Wheelchair propulsion/positioning, UE/LE Coordination activities, Therapeutic Activities, Skin care/wound management, Pain management, Discharge planning, Training and development officer, Cognitive remediation/compensation, Disease management/prevention, Functional mobility training, Patient/family education, Splinting/orthotics, Therapeutic Exercise, Visual/perceptual remediation/compensation  OT Interventions Training and development officer, Community reintegration, Disease mangement/prevention, Neuromuscular re-education, Patient/family education, Self Care/advanced ADL retraining, Therapeutic Exercise, UE/LE Coordination activities, Wheelchair propulsion/positioning, Cognitive remediation/compensation, Discharge planning, DME/adaptive equipment instruction, Functional mobility training, Pain management, Psychosocial support, Skin care/wound managment, Therapeutic Activities, Visual/perceptual remediation/compensation, UE/LE Strength taining/ROM  SLP Interventions Cognitive remediation/compensation, Internal/external aids, Dysphagia/aspiration precaution training, Functional tasks, Patient/family education, Therapeutic Activities, Environmental controls  TR Interventions    SW/CM Interventions Discharge Planning, Psychosocial Support, Patient/Family Education, Disease Management/Prevention   Barriers to Discharge MD  Medical stability  Nursing Lack of/limited family support, Home environment access/layout 1 level 2 ste bil rail, 4 step down into living room, solo, hired caregiver and daughter to assist  PT Decreased  caregiver support, Home environment access/layout, Lack of/limited family support, Incontinence, Neurogenic Bowel & Bladder    OT      SLP      SW Decreased caregiver support, Insurance underwriter for SNF coverage     Team Discharge Planning: Destination: PT-Home ,OT- Home (d/c destination to be determined based on improvement in Pt fucntional status and availability of family support at d/c) , SLP-Home Projected Follow-up: PT-Home health PT, 24 hour supervision/assistance, OT-  Other (comment) (follow up services to be determined), SLP-24 hour supervision/assistance, Home Health SLP Projected Equipment Needs: PT-To be determined, OT- To be determined (to be determined based on d/c location; may need tub bench and 3 in 1 BSC if d/c'ing home), SLP-None recommended by SLP Equipment Details: PT- , OT-  Patient/family involved in discharge planning: PT- Patient,  OT-Patient, SLP-Patient  MD ELOS: 10 to 14 days Medical Rehab Prognosis:  Good Assessment: The patient has been admitted for CIR therapies with the diagnosis of right MCA embolic infarct. The team will be addressing functional mobility, strength, stamina, balance, safety, adaptive techniques and equipment, self-care, bowel and bladder mgt, patient and caregiver education, orthostatic hypotension with need for medication adjustments. Goals have been set at supervision. Anticipated discharge destination is home.        See Team Conference Notes for weekly updates to the plan of care

## 2022-12-13 NOTE — Progress Notes (Addendum)
Speech Language Pathology Daily Session Note  Patient Details  Name: Carloyn Lahue MRN: [id number] Date of Birth: 02/21/1941  Today's Date: 12/13/2022 SLP Individual Time: 0815-0900 SLP Individual Time Calculation (min): 45 min  Short Term Goals: Week 1: SLP Short Term Goal 1 (Week 1): Patient will identify safe vs. unsafe scenarios with mod A verbal and/or visual cues to achieve 75% accuracy SLP Short Term Goal 2 (Week 1): Patient will generate appropriate solutions to problems with mod A verbal and/or visual cues to achieve 75% accuracy SLP Short Term Goal 3 (Week 1): Patient will be given specific scenarios (verbal and/or visual) and anticipate up to 2 ways to minimize unsafe outcomes with mod A verbal and/or visual cues SLP Short Term Goal 4 (Week 1): Patient will sustain attention to functional tasks for 3 minute duration with mod A verbal redirection cues SLP Short Term Goal 5 (Week 1): Pt will consume current diet with minimal overt s/sx of aspiration and improved self monitoring of left anterior spillage with min A reminder cues  Skilled Therapeutic Interventions: Pt seen this date for skilled ST intervention targeting cognitive goals outlined in care plan. Pt greeted awake/alert and sitting upright with breakfast tray in front of her; ate 0% of morning meal. Upon inquiry, pt reports she is finished with her breakfast. Pt reporting she felt the urge to have a bowel movement; therefore, transfer from bed to [facility] with Min A; per safety plan, donned thigh high TED hose with Total A. Pt unable to produce bowel movement or urinate. Total A for peri-care for time management. Min A for transfer from [facility] to w/c. Agreeable to intervention in speech office. Flat affect with decreased eye contact noted throughout.  Today's session with emphasis on cognitive skills. SLP administered Mini-Mental State Exam (MMSE) with pt achieving a score of 23 out of 30 which is indicative of at least mild  cognitive impairment for very basic cognitive tasks; likely will appear more severe in the setting of complex ADL and IADL tasks. Oriented grossly x 4.   Informally, pt exhibited delayed processing, inattention, decreased working memory, functional recall, and problem-solving. Pt benefited from verbal repetition, extended processing time, simplified commands, demonstration cues, and verbal + visual schedule of activities for therapy session. Providing schedule of activities did appear to help decrease instances of perseveration. Additionally, pt completed peg design task with overall Mod A verbal and visual cues for self-correcting errors and Mod A verbal and visual/demonstration cues for basic problem-solving re: how to correct errors. Throughout today's session, pt benefited from intermittent Min A verbal cues for redirection and attention to task. Pt correctly recalled room number following 10 + minute delay with distraction, given Sup A.  Recommend upcoming therapy sessions focus on dysphagia management, given pt with minimal intake this AM, though reports she feels her appetite is improving.   Pt returned to room and left OOB in w/c with all safety measures activated and call bell within reach. Reviewed today's therapy session and provided pt with sensory apron as distraction and to keep pt occupied while waiting for upcoming PT session. Continue per current ST POC.   Pain No pain reported; NAD  Therapy/Group: Individual Therapy  Sherard Sutch A Eun Vermeer 12/13/2022, 11:27 AM

## 2022-12-13 NOTE — Progress Notes (Signed)
Physical Therapy Session Note  Patient Details  Name: Leslie [name] MRN: [id number] Date of Birth: 1941/03/14  Today's Date: 12/13/2022 PT Individual Time: 0910-1006 and 8416-6063 PT Individual Time Calculation (min): 56 min and 56 min   Short Term Goals: Week 1:  PT Short Term Goal 1 (Week 1): Pt will perform supine<>sit with supervision consistently PT Short Term Goal 2 (Week 1): Pt will perform sit<>stands using LRAD with CGA consistently PT Short Term Goal 3 (Week 1): Pt will perform bed<>chair transfers using LRAD with CGA consistently PT Short Term Goal 4 (Week 1): Pt will ambulate at least 19f using LRAD with CGA PT Short Term Goal 5 (Week 1): Pt will navigate 4 steps using HRs with CGA  Skilled Therapeutic Interventions/Progress Updates:    Session 1: Pt received sitting in w/c reporting feeling need to have BM this morning, but has been unable to void. Pt agreeable to therapy session with toileting assistance first. Pt already wearing B LE thigh high TED hose. Vitals sitting to start session: BP 115/70 (MAP 83), HR 73bpm, SpO2 99% on room air. Sit>stand w/c>RW with light min assist for lifting to stand - cuing to recall pushing up from chair rather than pulling up on AD.   Gait in/out bathroom using RW with light min assist for balance (pt with 1x R LOB when navigating over doorway threshold) - pt does have some B LE shaking this morning that was present at eval, but not yesterday. Standing with light min assist for balance while pt managed LB clothing with max verbal cuing. Vitals reassessed sitting on toilet: BP 122/85 (MAP 95), HR 82bpm. Continent of bowels - sitting dependent peri-care (pt states at baseline she has to reach from front to wipe because she cannot twist due to her back hx). Sit>stand toilet>RW with min assist for lifting to stand and balance from this low seat height.  Sitting in w/c at sink performed hand hygiene and oral care with mod/max verbal cuing to  ensure cleanliness as pt continues to hold food in her mouth with impaired awareness of it.  Sit<>stands w/c<>RW with CGA/light min assist remainder of session.  Gait training 1160fx2 (seated break between) using RW with CGA and intermittent light min assist for steadying - achieves reciprocal stepping pattern despite slow gait speed and mild forward trunk flexed at hips. Therapist bringing w/c behind pt to allow seated rest break when fatigued.  Vitals sitting after 1st walk: BP 98/66 (MAP 75), HR 78bpm  Donned abdominal binder and reassessed in sitting: BP 114/70 (MAP 84), HR 69bpm  Vitals, sitting after 2nd walk: BP 102/69 (MAP 79), HR 69bpm   Pt reports she thought she went and stayed in a different facility last night but is reoriented and believes therapist when educating her she stayed in the same room.  At end of session, pt left seated at nurses station in w/c with seat belt alarm on and pt's nurse present.   Session 2: Pt has been moved to a room closer to the nurses station for increased safety. Pt received supine in bed with her daughter, PaJeannene Patellapresent and pt agreeable to therapy session. Pt reports she doesn't like being alone in her room, but was upset she couldn't move/push her w/c earlier when sitting at nurses station, therapist reinforced education on brakes being locked for her safety and that the nurses still have to maintain the privacy of other pt's when she is sitting there.  Supine>sitting R EOB, HOB partially  elevated, with supervision.  Sitting EOB without trunk instability, donned thigh high TED hose and abdominal binder total assist for time.  L stand pivot EOB>w/c using UE support on w/c armrest with light min assist for safety as pt reaches across her body to grab w/c armrest making her trunk rotate the wrong direction.   Transported to/from gym in w/c for time management and energy conservation.  Vitals sitting in w/c at beginning of session: BP 121/65 (MAP 80),  HR 72bpm   Gait training ~18f x2 L HHA with min assist for balance - pt with improving balance stability and less R lateral lean - continues to have decreased gait speed with mild forward flexed trunk posture (hinged forward at hips) and reciprocal stepping pattern although decreased step lengths.  Vitals after 1st walk: BP 99/60 (MAP 73), HR 76bpm  Vitals after 2nd walk: BP 102/70 (MAP 81), HR 77bpm   Stair navigation training ascending/descending 8 steps + 4 steps (6" height) using B HRs with min assist - step-to pattern and pt requiring reinforcement of education on ascending leading with R LE and descending leading with L LE (she has this written on a sign at top/bottom of her steps at home to help her remember) - mild instability noted and pt with gradual fatigue not powering up as easily during ascent.  Vitals during seated rest break: BP 94/61 (MAP 73), HR 80bpm   Gait training ~1310fback to her room using RW with therapist bringing w/c in event of fatigue with CGA/light min assist for steadying/balance - continues to have mild excessive forward trunk flexion, hinged at hips, with decreased gait speed although min reciprocal stepping pattern with decreased step lengths.  Had pt turn and sit on EOB to practice turning fully using RW prior to initiating sit for carryover from education/training yesterday.  L stand pivot EOB>w/c using RW with CGA and pt demoing excellent recall of ability turn fully prior to sitting.   Vitals at end of session: BP 112/65 (MAP 79), HR 79bpm   Pt left seated in w/c with needs in reach, seat belt alarm on, and her daughter present.   Therapy Documentation Precautions:  Precautions Precautions: Fall, Other (comment) Precaution Comments: orthostatic, TEDs and ABD binder Restrictions Weight Bearing Restrictions: No Other Position/Activity Restrictions: TED hose, abdominal binder, ace wraps   Pain:  Session 1: Reports back pain/discomfort from  prolonged sitting on toilet - improved with repositioning and no additional complaints of pain.  Session 2: No reports of pain throughout session.    Therapy/Group: Individual Therapy  CaTawana Scale PT, DPT, NCS, CSRS 12/13/2022, 7:59 AM

## 2022-12-13 NOTE — Progress Notes (Signed)
PROGRESS NOTE   Subjective/Complaints:  Was up early waiting for therapy, pt was oriented to therapy schedule   ROS:  Denies abd pain, N/V, no breathing issues   Objective:   No results found. Recent Labs    12/11/22 0546  WBC 9.5  HGB 12.3  HCT 39.3  PLT 228    Recent Labs    12/11/22 0546  NA 138  K 3.8  CL 102  CO2 27  GLUCOSE 104*  BUN 15  CREATININE 1.30*  CALCIUM 9.3     Intake/Output Summary (Last 24 hours) at 12/13/2022 0750 Last data filed at 12/12/2022 1329 Gross per 24 hour  Intake 480 ml  Output --  Net 480 ml          Physical Exam: Vital Signs Blood pressure 137/73, pulse 65, temperature (!) 97.4 F (36.3 C), resp. rate 16, height '5\' 7"'$  (1.702 m), weight 64 kg, SpO2 100 %.   General: No acute distress Mood and affect are appropriate Heart: Regular rate and rhythm no rubs murmurs or extra sounds Lungs: Clear to auscultation, breathing unlabored, no rales or wheezes Abdomen: Positive bowel sounds, soft nontender to palpation, nondistended Extremities: No clubbing, cyanosis, or edema Skin: No evidence of breakdown, no evidence of rash Neurologic: oriented to person and place Cranial nerves II through XII intact, motor strength is 4/5 in bilateral deltoid, bicep, tricep, grip, hip flexor, knee extensors, ankle dorsiflexor and plantar flexor  Musculoskeletal: Full range of motion in all 4 extremities. No joint swelling   Assessment/Plan: 1. Functional deficits which require 3+ hours per day of interdisciplinary therapy in a comprehensive inpatient rehab setting. Physiatrist is providing close team supervision and 24 hour management of active medical problems listed below. Physiatrist and rehab team continue to assess barriers to discharge/monitor patient progress toward functional and medical goals  Care Tool:  Bathing    Body parts bathed by patient: Right arm, Left arm, Chest,  Abdomen, Front perineal area, Right upper leg, Left upper leg, Face   Body parts bathed by helper: Right lower leg, Buttocks, Left lower leg     Bathing assist Assist Level: Moderate Assistance - Patient 50 - 74%     Upper Body Dressing/Undressing Upper body dressing   What is the patient wearing?: Pull over shirt    Upper body assist Assist Level: Minimal Assistance - Patient > 75%    Lower Body Dressing/Undressing Lower body dressing      What is the patient wearing?: Pants     Lower body assist Assist for lower body dressing: Moderate Assistance - Patient 50 - 74%     Toileting Toileting    Toileting assist Assist for toileting: Moderate Assistance - Patient 50 - 74%     Transfers Chair/bed transfer  Transfers assist     Chair/bed transfer assist level: Minimal Assistance - Patient > 75% Chair/bed transfer assistive device: Programmer, multimedia   Ambulation assist      Assist level: Minimal Assistance - Patient > 75% Assistive device: Walker-rolling Max distance: 98f   Walk 10 feet activity   Assist     Assist level: Minimal Assistance - Patient > 75%  Assistive device: Walker-rolling   Walk 50 feet activity   Assist    Assist level: Minimal Assistance - Patient > 75% Assistive device: Walker-rolling    Walk 150 feet activity   Assist Walk 150 feet activity did not occur: Safety/medical concerns (impaired endurance)         Walk 10 feet on uneven surface  activity   Assist Walk 10 feet on uneven surfaces activity did not occur: Safety/medical concerns         Wheelchair     Assist Is the patient using a wheelchair?: Yes (used for transportation) Type of Wheelchair: Manual    Wheelchair assist level: Dependent - Patient 0%      Wheelchair 50 feet with 2 turns activity    Assist        Assist Level: Dependent - Patient 0%   Wheelchair 150 feet activity     Assist      Assist Level:  Dependent - Patient 0%   Blood pressure 137/73, pulse 65, temperature (!) 97.4 F (36.3 C), resp. rate 16, height '5\' 7"'$  (1.702 m), weight 64 kg, SpO2 100 %.  Medical Problem List and Plan: 1. 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  Antithrombotics: -DVT/anticoagulation:  Pharmaceutical: Eliquis             -antiplatelet therapy: N/A 3. Pain Management: Tylenol as needed 4. Mood/Behavior/Sleep: Provide emotional support             -antipsychotic agents: N/A 5. Neuropsych/cognition: This patient is capable of making decisions on her own behalf. 6. Skin/Wound Care: Routine skin checks 7. Fluids/Electrolytes/Nutrition: Routine in and outs with follow-up chemistries 8.  Atrial fibrillation.  Continue amiodarone 200 mg daily.  Eliquis as directed.  Cardiac rate controlled 9.  Orthostasis.  ProAmatine 5 mg twice daily.  Increased to TID as pt still has orthostatic drops with mild symptoms  10.  Hypothyroidism.  Synthroid  11.  Constipation- benign abd exam, on colace, add Miralax   LOS: 3 days A FACE TO FACE EVALUATION WAS PERFORMED  Charlett Blake 12/13/2022, 7:50 AM

## 2022-12-13 NOTE — Progress Notes (Signed)
Occupational Therapy Session Note  Patient Details  Name: Nataki Mccrumb MRN: [id number] Date of Birth: [birth year]  Today's Date: 12/13/2022 OT Individual Time: 1030-1200 OT Individual Time Calculation (min): 90 min    Short Term Goals: Week 1:  OT Short Term Goal 1 (Week 1): Pt will attend to L visual field/side of body during functional activities with min verbal cues OT Short Term Goal 2 (Week 1): Pt will tolerate standing using LRAD during functional activties/BADLs for 3 minutes OT Short Term Goal 3 (Week 1): Pt will complete toilet transfer CGA using LRAD  Skilled Therapeutic Interventions/Progress Updates:     Pt received resting in wc at nurses desk with TEDs and Abdominal binder donned. Pt presenting mildly agitated d/t Pt perception of sitting for prolonged period of time. Pt receptive to skilled therapy session, reporting "I am ready to exercise". Pt reporting 0/10 pain during session.  Pt perseverating on wc at beginning of session reporting to therapist "my wc is broken, I need a new one." Pt provided therapeutic support and educated on wc and purpose of keeping wc locked when not in therapy sessions with improvement in moral noted. Pt easily redirected to functional task following education.   Pt completed functional mobility in wc to increase functional use of LUE and for endurance training. Pt able to propel wc from nurses desk to bed room with min-mod A for navigating obstacles in hallway and mod verbal cues for technique and safety. Pt able to recall room number, but not location. Pt instructed to scan R side of hallway for room number and verbalize each number as she passed to identify room requiring min cues to attend to room numbers. Task graded up to address Pt attention to L side and selective attention deficits. Pt instructed to complete dual tasking activity of propelling wc while visually scanning R/L sides of hallway to verbalize room numbers as she approached them.  Hallway busy during this time with Pt requiring mod verbal cues to attend to task.   Vitals assessed upon arriving to therapy gym following functional mobility training in wc. Vitals taken in sitting BP 103/67 HR 66. Pt completed sit>stand using RW CGA with mod verbal cues for hand placement and body mechanics. Attempted to take vitals taken in standing, but unable d/t Pt not tolerating standing position.  Vitals taken immediately upon returning to sitting BP 98/69 (79) HR 71.   Pt completed sit>stand- reporting no increase in nausea. Pt instructed to complete card sorting task in standing with RW maintaining balance with CGA-min A as Pt fatigued during task and mod verbal cues required to facilitate Pt weight shifting to L side. Pt able to maintain standing during functional task ~5 minutes. Pt able to complete card sorting task with mod verbal cues d/t Pt distracted by other Pt in gym and to correct errors x2. Task graded up with Pt instructed to match card stack to cards placed on sheet to her L side. Pt able to tolerate standing ~3 minutes during activity. Pt required mod direct/questioning cues to recall directions for activity while completing it.   Pt transported back to her room total A in wc for time management. Pt ambulated from room door to toilet using RW CGA-min A for RW management and mod verbal cues for safety (no report of nausea during ambulation). Pt min A for clothing management (continent void). Pt ambulated to her bed using RW CGA and returned to bed supervision. Pt was left resting in bed with  call bell in reach, bed alarm on, and all needs met. Nursing staff updated on Pt status and Pt room left partially open for monitoring.   Therapy Documentation Precautions:  Precautions Precautions: Fall, Other (comment) Precaution Comments: orthostatic, TEDs and ABD binder Restrictions Weight Bearing Restrictions: No Other Position/Activity Restrictions: TED hose, abdominal binder, ace  wraps General:   Vital Signs:  Pain: Pain Assessment Pain Scale: 0-10 Pain Score: 0-No pain ADL: ADL Eating: Supervision/safety Where Assessed-Eating: Bed level Grooming: Moderate assistance Where Assessed-Grooming: Bed level Upper Body Bathing: Minimal assistance Where Assessed-Upper Body Bathing: Bed level Lower Body Bathing: Moderate assistance Where Assessed-Lower Body Bathing: Bed level Upper Body Dressing: Minimal assistance Where Assessed-Upper Body Dressing: Bed level Lower Body Dressing: Minimal assistance Where Assessed-Lower Body Dressing: Bed level Toileting: Moderate assistance Where Assessed-Toileting: Bedside Commode Toilet Transfer: Minimal assistance Toilet Transfer Method: Stand pivot Toilet Transfer Equipment: Bedside commode Tub/Shower Transfer: Not assessed Social research officer, government: Not assessed   Therapy/Group: Individual Therapy  Janey Genta 12/13/2022, 11:05 AM

## 2022-12-14 DIAGNOSIS — I6329 Cerebral infarction due to unspecified occlusion or stenosis of other precerebral arteries: Secondary | ICD-10-CM | POA: Diagnosis not present

## 2022-12-14 NOTE — Progress Notes (Signed)
Patient ID: Leslie [name], female   DOB: 1941-01-19, 82 y.o.   MRN: [id number] Met with the patient to review current situation, rehab process and plan of care. Patient is able to note memory issues but able to state discharge date. Able to make needs known and select items of preference for meals. Reviewed medications, patient unable to recall but noted she did remember tips for eating from the SLP. Continue to follow along to address educational needs for discharge. Margarito Liner

## 2022-12-14 NOTE — Progress Notes (Signed)
Physical Therapy Session Note  Patient Details  Name: Leslie [name] MRN: [id number] Date of Birth: 1941-04-15  Today's Date: 12/14/2022 PT Individual Time: 1016-1059 PT Individual Time Calculation (min): 43 min   Short Term Goals: Week 1:  PT Short Term Goal 1 (Week 1): Pt will perform supine<>sit with supervision consistently PT Short Term Goal 2 (Week 1): Pt will perform sit<>stands using LRAD with CGA consistently PT Short Term Goal 3 (Week 1): Pt will perform bed<>chair transfers using LRAD with CGA consistently PT Short Term Goal 4 (Week 1): Pt will ambulate at least 120f using LRAD with CGA PT Short Term Goal 5 (Week 1): Pt will navigate 4 steps using HRs with CGA  Skilled Therapeutic Interventions/Progress Updates:     Pt received seated in recliner and agrees to therapy. Reports chronic pain in back, no worse than usual> PT provides rest breaks as needed and cues for posture to help manage pain. Pt performs sit to stand and [facility] and [facility] due to slight LOB to the R. Cues for initiation and positioning for safety. Pt noted to be speaking to "Carly" several times despite PT telling pt that CBethann Berkshireis not present. WC transport to gym for time management. BP taken in sitting 112/71. Stand step to mat table with minA and PT provides totalA to don abdominal binder. Pt then performs sit to stand from mat with minA and cues for hand placement. Pt ambulates x150' with RW and minA with consistent slight R lateral bias and forward flexed posture. PT provides verbal and tactile cues to correct. Following seated rest break, pt ambulates x200' with RW and same assist and cues, though pt has difficulty correcting for R lateral bias, and even states "my R side is going to be sore because I'm using it too much". Pt also requires increased cueing for safety when turning to sit in WC. WC transport back to room. Stand step to bed with modA and no AD. Left supine with alarm intact  and all needs within reach.  Therapy Documentation Precautions:  Precautions Precautions: Fall, Other (comment) Precaution Comments: orthostatic, TEDs and ABD binder Restrictions Weight Bearing Restrictions: No Other Position/Activity Restrictions: TED hose, abdominal binder, ace wraps   Therapy/Group: Individual Therapy  WBreck Coons PT, DPT 12/14/2022, 5:26 PM

## 2022-12-14 NOTE — Progress Notes (Addendum)
Speech Language Pathology Daily Session Note  Patient Details  Name: Leslie [name] MRN: [id number] Date of Birth: 03-Aug-1941  Today's Date: 12/14/2022 SLP Individual Time: 0738-0820 SLP Individual Time Calculation (min): 42 min  Short Term Goals: Week 1: SLP Short Term Goal 1 (Week 1): Patient will identify safe vs. unsafe scenarios with mod A verbal and/or visual cues to achieve 75% accuracy SLP Short Term Goal 2 (Week 1): Patient will generate appropriate solutions to problems with mod A verbal and/or visual cues to achieve 75% accuracy SLP Short Term Goal 3 (Week 1): Patient will be given specific scenarios (verbal and/or visual) and anticipate up to 2 ways to minimize unsafe outcomes with mod A verbal and/or visual cues SLP Short Term Goal 4 (Week 1): Patient will sustain attention to functional tasks for 3 minute duration with mod A verbal redirection cues SLP Short Term Goal 5 (Week 1): Pt will consume current diet with minimal overt s/sx of aspiration and improved self monitoring of left anterior spillage with min A reminder cues  Skilled Therapeutic Interventions: Pt seen this date for skilled ST intervention targeting safe swallowing/dysphagia and cognitive goals outlined above. Pt greeted awake/alert and OOB in recliner chair, consuming AM meal with NT present for supervision to optimize safety. Agreeable to intervention. Pt reports poor night's sleep due to ongoing coughing and nausea. States she feels it is r/t allergies and "dust" in the room. RN notified and provided pt with PRN Zofran. This AM, pt ate approximately 50% of breakfast.   Today's session with emphasis on diet tolerance monitoring and functional cognitive skill training. SLP provided skilled observation of current diet textures (Dysphagia 3 and thin liquids). Pt self-fed with Sup-Min A for attention to task, with only one instances of coughing likely due to oral residuals and decreased awareness to bolus. From an  oral standpoint, pt continues to present with mildly prolonged manipulation + manipulation, decreased bolus cohesion, and incomplete A-P transit with solid textures resulting in L>R oral residuals along lateral sulci and within buccal cavity. Pt benefited from Sup-Min A for implementation of aspiration precautions to include only accepting one bite at a time, minimize distractions, not talking while eating, and use of liquid rinse to clear oral residuals. Use of mirror and Min-Mod verbal and visual/demonstration cues were beneficial in clearing oral residuals; without cues, pt did not demonstrate awareness of residuals. Additionally,  required Min A cues to remain on task when eating AM meal given pt was noted to stop at times and begin to initiate other tasks not r/t eating (e.g. filing her nails, figity with towel, etc.).Given today's meal observation, continue to recommend current diet textures. Pt amenable to recommendation and appreciative of education.   Re: cognition, pt recalled functional information (PRN medications to help with nausea and sleep given pt reports) given overall Mod A, faded to Min A multimodal cues to include writing information down on her notepad to facilitate delayed recall and address current symptoms. Appeared to benefit from and verbalize appreciation for written cues for recall. Additionally, wrote down interventions utilized during today's session to mitigate oral residuals (lingual/finger sweep, liquid rinse, and use of mirror) From a communication standpoint, benefits from allowing extra time for thought formulation and expression, close-ended vs open-ended questions, yes/no questions, and verbal choice prompts. Continues to benefit from being provided with schedule of activities.  Pt left in room and OOB in recliner chair with all safety measures activated and call bell within reach. Continue per current ST POC.  Specifically, will continue to address ongoing cognitive  deficits within functional contexts. Appears to be tolerating current diet during today's session; therefore, continue to recommend Dysphagia 3 and thin liquid consistencies.    Pain No pain reported; NAD  Therapy/Group: Individual Therapy  Leslie [name] A Leslie [name] 12/14/2022, 12:34 PM

## 2022-12-14 NOTE — Progress Notes (Signed)
Occupational Therapy Session Note  Patient Details  Name: Leslie [name] MRN: [id number] Date of Birth: 10-21-1941  Today's Date: 12/14/2022 OT Individual Time: 0845-1000 OT Individual Time Calculation (min): 75 min  OT Individual Time: 1400-1500 OT Individual Time Calculation (min): 60 min   Short Term Goals: Week 1:  OT Short Term Goal 1 (Week 1): Pt will attend to L visual field/side of body during functional activities with min verbal cues OT Short Term Goal 2 (Week 1): Pt will tolerate standing using LRAD during functional activties/BADLs for 3 minutes OT Short Term Goal 3 (Week 1): Pt will complete toilet transfer CGA using LRAD  Skilled Therapeutic Interventions/Progress Updates:     AM Session: Pt received sitting up in recliner presenting to be in good spirits and receptive to skilled OT session. Pt reporting 0/10 pain stating she feels like she will need to have a BM later today. Vitals assessed with Pt in sitting not wearing thigh high TED hose or abdominal binder. Vitals sitting at beginning of session BP 101/57 HR 80 PSO2 98 on room air. Sit>stand using RW light min A. Max motivation provided to encourage Pt to maintain standing position. Assessed Pt vitals in standing 64/48 HR 90 PSO2 98. Symptoms of dizziness in standing.  Pt instructed to sit. Pt completed BUE and BLE exercises- over head/cross body punches, knee extension/flexion, and alternating toe/heel raises. Thigh high TEDs and abdominal binder donned on Pt. Vitals taken in sitting recovering to BP 97/77 HR 90. Sit>stand using RW CGA- cueing required to recall hand placement and facilitate anterior weight shifting.  Pt completed marches in place to increase BP while standing. Pt able to tolerate 1.5 minutes of standing marches- reporting sleepiness. Attempted to take Pt vitals in standing, but unable to tolerate prolonged standing despite providing maximal motivation and distractions. Vitals immediately returning to siting  BP 90/76 (82) HR 78.  Pt completing functional mobility in room sit>stand>ambulating across room ~10 ft heavy min HHA. Vitals following functional mobility in sitting: BP 106/67 (80) HR 80. Pt transported total A to therapy gym for time management. Pt ambulated within therapy gym from wc to EOM using RW CGA mod cues for safety and recall staying within RW. Pt completed dynamic standing balance bean bag toss/color matching activity. Pt able to correctly match 8/8 bean bags and maintain standing balance without AD for ~2 minutes. Pt fatiguing and reporting nausea at end of activity- spontaneously returning to sitting. Pt and OT discussed importance of being safe and OT educated Pt on communicating need to sit vs. Sitting without warning. Pt verbalized understanding. Squat pivot to R side without AD CGA. Pt transported back to room in wc total A. Squat pivot to L side to recliner CGA. Pt was left resting in wc with call bell in reach, chair alarm on, and al needs met.   PM Session:  Pt received sitting up in bed with abdominal binder and thigh high TEDs donned presenting to be mildly agitated. Pt reporting she cannot get her phone to work stating "I need to call my DTR so she can help me figure out the claim on my bathroom". Pt required max cues and elimination of distractions to be redirected. PT provided therapeutic support with Pt agreeing to wait to call her DTR until after session. Pt reporting 0/10 pain- mild nausea symptoms.  Pt supine>EOB supervision with HOB elevated. Pt sitting EOB reported nausea. Pt agreeable to listening to music and completing U/LB dance-based exercises to increase BP  prior to mobilization. Pt able to attend to task and follow therapist in completing exercises ~5 minutes. Vitals taken in sitting following activity BP 122/75 HR 65 PSO2 98%. Pt very encouraged seeing higher BP numbers. Sit>stand using RW CGA cueing required for hand positioning. Pt ambulated to wc across room using RW  and transferred to [facility] CGA. Pt reporting no dizziness or nausea symptoms during ambulation.  Pt propelled wc from room to ADL apartment for endurance training and improve awareness of LUE. Pt required mod verbal cues utilize LUE and mod A to navigate hallway/avoid barriers.  Pt participated in kitchen safety activities and discussion to increase safety upon d/c. Pt able to identify 6/6 kitchen hazards in kitchen with mod questioning cues. Pt also verbalizing additional kitchen hazards such as putting metal in microwave, touching a hot pot, leaving items on stove, leaving oven on after using.  Pt reported urgent needs to use restroom during kitchen safety task. Pt transferred total A to room in wc. Pt completed stand pivot transfer to toilet with mod verbal cues for technique. Total A clothing management for time management. Pt requiring increased time on toilet d/t feeling need to have BM. Nursing staff called and Pt was left sitting on toilet with nursing staff present in room with all needs met.   Therapy Documentation Precautions:  Precautions Precautions: Fall, Other (comment) Precaution Comments: orthostatic, TEDs and ABD binder Restrictions Weight Bearing Restrictions: No Other Position/Activity Restrictions: TED hose, abdominal binder, ace wraps   Pain: Pain Assessment Pain Scale: 0-10 Pain Score: 0-No pain ADL: ADL Eating: Supervision/safety Where Assessed-Eating: Bed level Grooming: Moderate assistance Where Assessed-Grooming: Bed level Upper Body Bathing: Minimal assistance Where Assessed-Upper Body Bathing: Bed level Lower Body Bathing: Moderate assistance Where Assessed-Lower Body Bathing: Bed level Upper Body Dressing: Minimal assistance Where Assessed-Upper Body Dressing: Bed level Lower Body Dressing: Minimal assistance Where Assessed-Lower Body Dressing: Bed level Toileting: Moderate assistance Where Assessed-Toileting: Bedside Commode Toilet Transfer: Minimal  assistance Toilet Transfer Method: Stand pivot Toilet Transfer Equipment: Bedside commode Tub/Shower Transfer: Not assessed Social research officer, government: Not assessed   Therapy/Group: Individual Therapy  Janey Genta 12/14/2022, 9:00 AM

## 2022-12-14 NOTE — Progress Notes (Signed)
PROGRESS NOTE   Subjective/Complaints:  Moved closer to nsg station due to trying to get OOB, pt realizes she is unsteady but "does not like to bother staff "  ROS:  Denies abd pain, N/V, no breathing issues   Objective:   No results found. No results for input(s): "WBC", "HGB", "HCT", "PLT" in the last 72 hours.  No results for input(s): "NA", "K", "CL", "CO2", "GLUCOSE", "BUN", "CREATININE", "CALCIUM" in the last 72 hours.   Intake/Output Summary (Last 24 hours) at 12/14/2022 0847 Last data filed at 12/14/2022 0824 Gross per 24 hour  Intake 600 ml  Output --  Net 600 ml          Physical Exam: Vital Signs Blood pressure 136/74, pulse 61, temperature 97.7 F (36.5 C), resp. rate 16, height '5\' 7"'$  (1.702 m), weight 64 kg, SpO2 98 %.   General: No acute distress Mood and affect are appropriate Heart: Regular rate and rhythm no rubs murmurs or extra sounds Lungs: Clear to auscultation, breathing unlabored, no rales or wheezes Abdomen: Positive bowel sounds, soft nontender to palpation, nondistended Extremities: No clubbing, cyanosis, or edema Skin: No evidence of breakdown, no evidence of rash Neurologic: oriented to person and place Cranial nerves II through XII intact, motor strength is 4/5 in bilateral deltoid, bicep, tricep, grip, hip flexor, knee extensors, ankle dorsiflexor and plantar flexor  Musculoskeletal: Full range of motion in all 4 extremities. No joint swelling   Assessment/Plan: 1. Functional deficits which require 3+ hours per day of interdisciplinary therapy in a comprehensive inpatient rehab setting. Physiatrist is providing close team supervision and 24 hour management of active medical problems listed below. Physiatrist and rehab team continue to assess barriers to discharge/monitor patient progress toward functional and medical goals  Care Tool:  Bathing    Body parts bathed by patient:  Right arm, Left arm, Chest, Abdomen, Front perineal area, Right upper leg, Left upper leg, Face   Body parts bathed by helper: Right lower leg, Buttocks, Left lower leg     Bathing assist Assist Level: Moderate Assistance - Patient 50 - 74%     Upper Body Dressing/Undressing Upper body dressing   What is the patient wearing?: Pull over shirt    Upper body assist Assist Level: Minimal Assistance - Patient > 75%    Lower Body Dressing/Undressing Lower body dressing      What is the patient wearing?: Pants     Lower body assist Assist for lower body dressing: Moderate Assistance - Patient 50 - 74%     Toileting Toileting    Toileting assist Assist for toileting: Moderate Assistance - Patient 50 - 74%     Transfers Chair/bed transfer  Transfers assist     Chair/bed transfer assist level: Minimal Assistance - Patient > 75% Chair/bed transfer assistive device: Programmer, multimedia   Ambulation assist      Assist level: Minimal Assistance - Patient > 75% Assistive device: Walker-rolling Max distance: 70f   Walk 10 feet activity   Assist     Assist level: Minimal Assistance - Patient > 75% Assistive device: Walker-rolling   Walk 50 feet activity   Assist  Assist level: Minimal Assistance - Patient > 75% Assistive device: Walker-rolling    Walk 150 feet activity   Assist Walk 150 feet activity did not occur: Safety/medical concerns (impaired endurance)         Walk 10 feet on uneven surface  activity   Assist Walk 10 feet on uneven surfaces activity did not occur: Safety/medical concerns         Wheelchair     Assist Is the patient using a wheelchair?: Yes (used for transportation) Type of Wheelchair: Manual    Wheelchair assist level: Dependent - Patient 0%      Wheelchair 50 feet with 2 turns activity    Assist        Assist Level: Dependent - Patient 0%   Wheelchair 150 feet activity      Assist      Assist Level: Dependent - Patient 0%   Blood pressure 136/74, pulse 61, temperature 97.7 F (36.5 C), resp. rate 16, height '5\' 7"'$  (1.702 m), weight 64 kg, SpO2 98 %.  Medical Problem List and Plan: 1. 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  Antithrombotics: -DVT/anticoagulation:  Pharmaceutical: Eliquis             -antiplatelet therapy: N/A 3. Pain Management: Tylenol as needed 4. Mood/Behavior/Sleep: Provide emotional support             -antipsychotic agents: N/A 5. Neuropsych/cognition: This patient is capable of making personal healthcare with assist but not financial decisions on her own behalf  Has clinical an dimaging study evidence of vascular dementia 6. Skin/Wound Care: Routine skin checks 7. Fluids/Electrolytes/Nutrition: Routine in and outs with follow-up chemistries 8.  Atrial fibrillation.  Continue amiodarone 200 mg daily.  Eliquis as directed.  Cardiac rate controlled 9.  Orthostasis improved .  ProAmatine 5 mg twice daily.  Increased to TID as pt still has orthostatic drops with mild symptoms  10.  Hypothyroidism.  Synthroid  11.  Constipation- benign abd exam, on colace, add Miralax   LOS: 4 days A FACE TO FACE EVALUATION WAS PERFORMED  Charlett Blake 12/14/2022, 8:47 AM

## 2022-12-15 DIAGNOSIS — I6329 Cerebral infarction due to unspecified occlusion or stenosis of other precerebral arteries: Secondary | ICD-10-CM | POA: Diagnosis not present

## 2022-12-15 NOTE — Progress Notes (Addendum)
Occupational Therapy Session Note  Patient Details  Name: Leslie [name] MRN: [id number] Date of Birth: May 20, 1941  Today's Date: 12/15/2022 OT Individual Time: 1303-1403 OT Individual Time Calculation (min): 60 min    Short Term Goals: Week 1:  OT Short Term Goal 1 (Week 1): Pt will attend to L visual field/side of body during functional activities with min verbal cues OT Short Term Goal 2 (Week 1): Pt will tolerate standing using LRAD during functional activties/BADLs for 3 minutes OT Short Term Goal 3 (Week 1): Pt will complete toilet transfer CGA using LRAD  Skilled Therapeutic Interventions/Progress Updates:  Pt received seated in recliner for skilled OT session with focus on L-side awareness/NMR and standing balance/tolerance. Pt agreeable to interventions, demonstrating overall pleasant mood. Pt with no reports of pain. OT offering intermediate rest breaks and positioning suggestions throughout session to address potential pain/fatigue and maximize participation/safety in session.   Pt completes recliner>WC stand-step transfer with CGA + Min verbal cuing for safe hand-placement of RW.   Pt dependent for WC transport to day room for time management. In day room, pt completes multiple STS transfer to participate in table-top activity, requiring CGA + RW and varying levels of multimodal cuing for safe hand-placement/RW management.   In standing with CGA overall, pt crosses mid-line to bring resistive clips from R-side and clip them on object in her L-side. Pt requiring multiple trials of activity to process directions given by OT, impulsively starting/continuing activity despite cuing. Pt correctly completes two-trials of activity using L-hand to reach/bring clips to L-side for awareness and NMR. Pt benefiting from simple/direct instruction with unnecessary items removed, such as extra resistive clips, to successfully carry out task. Pt with no signs/symptoms of decreased BP, tolerating  standing for ~2-3 mins without needing a seated rest break.   Pt perseverating on education provided by SLP in session, asking "should I chew on my right side or left" multiple times during session, reaching out to other therapists in therapy area to ask the same question. Pt confusing other therapists with SLP who saw her earlier in the day, stating "I feel like I will never get to talk to her again." OT offering redirection to task and assuring patient she will continue to receive speech therapy.   Pt transported to simulated kitchen environment, completing simple dish/utensil organization activity in standing with CGA + RW, and min cuing for attention to task. Pt requiring increased A and cuing to return to [facility] from kitchen counter, as patient attempting to walk around kitchen and initiating STS transfer prematurely, with some unsteadiness noted.   Pt completes stand-step transfer from WC>EOB with CGA, and bed mobility with supervision.   No symptoms of orthostatic BP reported or noted throughout session.   Pt remained resting in bed with all immediate needs met and bed alarm activated at end of session. Pt continues to be appropriate for skilled OT intervention to promote further functional independence.   Therapy Documentation Precautions:  Precautions Precautions: Fall, Other (comment) Precaution Comments: orthostatic, TEDs and ABD binder Restrictions Weight Bearing Restrictions: No Other Position/Activity Restrictions: TED hose, abdominal binder, ace wraps   Therapy/Group: Individual Therapy  Maudie Mercury, OTR/L, MSOT  12/15/2022, 7:58 AM

## 2022-12-15 NOTE — Progress Notes (Signed)
PROGRESS NOTE   Subjective/Complaints: Trying to play games on her ipad Called her daughter to help her set this up Has no medical complaints Daughter asks about d/c date  ROS:  Denies abd pain, N/V, no breathing issues   Objective:   No results found. No results for input(s): "WBC", "HGB", "HCT", "PLT" in the last 72 hours.  No results for input(s): "NA", "K", "CL", "CO2", "GLUCOSE", "BUN", "CREATININE", "CALCIUM" in the last 72 hours.   Intake/Output Summary (Last 24 hours) at 12/15/2022 1617 Last data filed at 12/15/2022 1300 Gross per 24 hour  Intake 484 ml  Output --  Net 484 ml         Physical Exam: Vital Signs Blood pressure 111/66, pulse 62, temperature 97.8 F (36.6 C), resp. rate 17, height '5\' 7"'$  (1.702 m), weight 64 kg, SpO2 93 %.  Gen: no distress, normal appearing HEENT: oral mucosa pink and moist, NCAT Cardio: Reg rate Chest: normal effort, normal rate of breathing Abd: soft, non-distended Ext: no edema Psych: pleasant, normal affect Neurologic: oriented to person and place Cranial nerves II through XII intact, motor strength is 4/5 in bilateral deltoid, bicep, tricep, grip, hip flexor, knee extensors, ankle dorsiflexor and plantar flexor  Musculoskeletal: Full range of motion in all 4 extremities. No joint swelling   Assessment/Plan: 1. Functional deficits which require 3+ hours per day of interdisciplinary therapy in a comprehensive inpatient rehab setting. Physiatrist is providing close team supervision and 24 hour management of active medical problems listed below. Physiatrist and rehab team continue to assess barriers to discharge/monitor patient progress toward functional and medical goals  Care Tool:  Bathing    Body parts bathed by patient: Right arm, Left arm, Chest, Abdomen, Front perineal area, Right upper leg, Left upper leg, Face   Body parts bathed by helper: Right lower leg,  Buttocks, Left lower leg     Bathing assist Assist Level: Moderate Assistance - Patient 50 - 74%     Upper Body Dressing/Undressing Upper body dressing   What is the patient wearing?: Pull over shirt    Upper body assist Assist Level: Minimal Assistance - Patient > 75%    Lower Body Dressing/Undressing Lower body dressing      What is the patient wearing?: Pants     Lower body assist Assist for lower body dressing: Moderate Assistance - Patient 50 - 74%     Toileting Toileting    Toileting assist Assist for toileting: Moderate Assistance - Patient 50 - 74%     Transfers Chair/bed transfer  Transfers assist     Chair/bed transfer assist level: Minimal Assistance - Patient > 75% Chair/bed transfer assistive device: Programmer, multimedia   Ambulation assist      Assist level: Minimal Assistance - Patient > 75% Assistive device: Walker-rolling Max distance: 23f   Walk 10 feet activity   Assist     Assist level: Minimal Assistance - Patient > 75% Assistive device: Walker-rolling   Walk 50 feet activity   Assist    Assist level: Minimal Assistance - Patient > 75% Assistive device: Walker-rolling    Walk 150 feet activity   Assist Walk 150  feet activity did not occur: Safety/medical concerns (impaired endurance)         Walk 10 feet on uneven surface  activity   Assist Walk 10 feet on uneven surfaces activity did not occur: Safety/medical concerns         Wheelchair     Assist Is the patient using a wheelchair?: Yes (used for transportation) Type of Wheelchair: Manual    Wheelchair assist level: Dependent - Patient 0%      Wheelchair 50 feet with 2 turns activity    Assist        Assist Level: Dependent - Patient 0%   Wheelchair 150 feet activity     Assist      Assist Level: Dependent - Patient 0%   Blood pressure 111/66, pulse 62, temperature 97.8 F (36.6 C), resp. rate 17, height '5\' 7"'$   (1.702 m), weight 64 kg, SpO2 93 %.  Medical Problem List and Plan: 1. 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  2.  Antithrombotics: -DVT/anticoagulation:  Pharmaceutical: Eliquis             -antiplatelet therapy: N/A 3. Pain Management: Tylenol as needed 4. Mood/Behavior/Sleep: Provide emotional support             -antipsychotic agents: N/A 5. Neuropsych/cognition: This patient is capable of making personal healthcare with assist but not financial decisions on her own behalf  Has clinical an dimaging study evidence of vascular dementia 6. Skin/Wound Care: Routine skin checks 7. Fluids/Electrolytes/Nutrition: Routine in and outs with follow-up chemistries 8.  Atrial fibrillation.  Continue amiodarone 200 mg daily.  Eliquis as directed.  Cardiac rate controlled 9.  Orthostasis improved .  Continue ProAmatine,  Increased to TID as pt still has orthostatic drops with mild symptoms  10.  Hypothyroidism.  Continue Synthroid  11.  Constipation- benign abd exam, on colace, add Miralax   LOS: 5 days A FACE TO FACE EVALUATION WAS PERFORMED  Clide Deutscher Jenel Gierke 12/15/2022, 4:17 PM

## 2022-12-15 NOTE — Progress Notes (Signed)
Physical Therapy Session Note  Patient Details  Name: Leslie [name] MRN: [id number] Date of Birth: 06-09-1941  Today's Date: 12/15/2022 PT Individual Time: 0920-1020 and 4742-5956 PT Individual Time Calculation (min): 60 min and 48 min   Short Term Goals: Week 1:  PT Short Term Goal 1 (Week 1): Pt will perform supine<>sit with supervision consistently PT Short Term Goal 2 (Week 1): Pt will perform sit<>stands using LRAD with CGA consistently PT Short Term Goal 3 (Week 1): Pt will perform bed<>chair transfers using LRAD with CGA consistently PT Short Term Goal 4 (Week 1): Pt will ambulate at least 113f using LRAD with CGA PT Short Term Goal 5 (Week 1): Pt will navigate 4 steps using HRs with CGA  Skilled Therapeutic Interventions/Progress Updates:    Session 1: Pt received sitting in recliner attempting to call her daughter, [name] therapist assisted her in this for improved pt comfort - her daughter encourages pt to focus on therapy at this time. Pt agreeable to therapy session. Donned thigh high TED hose and improved abdominal binder positioning in preparation for session. Donned tennis shoes.  While sitting in recliner pt still talking to her daughter, [name] despite her no longer being on the phone - therapist reoriented pt of this 2x prior to pt [name] Pt also perseverating on the fact that last night/early this morning she was told that she could not be assisted with something "because it was dark outside" and pt upset about this - therapist attempts to educate pt that staff was likely trying to encourage her to go back to sleep so she would not flip her sleep/wake cycles. Vitals sitting in recliner after donning TEDs and abdominal binder to start session: BP 93/65 (MAP 72), HR 67bpm  R stand pivot recliner>w/c with min assist via B UE support and cuing to rotate hips fully prior to initiating sitting.   Transported to/from gym in w/c for time management  and energy conservation.  Sit>stand w/c>RW with CGA and cuing for pt to recall pushing up from w/c armrest rather than pulling up with both hands on AD.  Gait training ~443fusing RW with CGA for steadying/safety - continues to have slow gait speed but reciprocal stepping pattern as well as slight forward flexion at hips. Vitals assessed after in sitting: BP 102/74 (MAP 82), HR 70bpm   Gait training 1169fsing RW with CGA for safety/steadying - pt demos improved ability to look up at targeted area as pt navigating to nurses station to retrieve water - continues to have slow gait speed but reciprocal stepping pattern, slight forward trunk flexion.Therapist provided pt with water.   Gait training ~100f100fck using L HHA with light min assist for balance due to R lean bias but continues to achieve reciprocal stepping pattern although overall increased unsteadiness compared to when using AD. Vitals after: BP 99/60 (MAP 73), HR 75bpm   Gait training in hallway using RW while finding numbered 1-10 discs in ascending order with them placed back/forth between L/R side of hallway targeting visual scanning and L attention - CGA for steadying and cuing for improved AD management to get closer to disks while keeping AD close before reaching to pick them up - able to complete in <5 minutes with mod cuing for visually scanning environment.  Vitals after: BP 111/66 (MAP 79), HR 71bpm   Transported back to room and pt requesting to remain up in w/c - left with needs in reach and  seat belt alarm on.    Session 2: Pt received supine in bed with her daughter and family friend present eating. Pt agreeable to therapy session. Pt still perseverating on the fact last night, people told her she could not get help because it was "dark outside," therapist attempted to educate pt that likely staff were trying to encourage her to sleep at night to stay on correct sleep/wake cycle. Pt able to be reoriented to task at  hand.  Transitioned to sitting EOB with light min assist via HHA due to pt reaching out despite encouragement to perform without assist. Donned shoes and abdominal binder total assist for time management. L stand pivot EOB>w/c using RW with CGA for steadying - pt always turns more fully towards the seat when using AD compared to without. Transported to/from gym in w/c for time management and energy conservation.  Vitals sitting in w/c to start session: BP 128/79 (MAP 92), HR 66bpm wearing thigh high TEDs and abdominal binder.  Dynamic gait training using RW while ambulating back/forth between table and mat to collect 1 foam block at a time in order to create a copy of the pattern therapist put on the table - requires max/total cuing for sequencing and recalling correct color she is currently retrieving Vitals after 1s pattern, in sitting:  BP 108/70 (MAP 82), HR 78bpm  Attempted to initiate creating a 2nd design to see if pt with improved sequencing now that she understands the task, but after retrieving the first 2 blocks pt reports onset of significant nausea therefore ambulated ~61f over to w/c to assess vitals in sitting: BP 101/74 (MAP 84), HR 81bpm  During this task, pt noted to have more significant L inattention often not seeing the L side of the pattern on the table - more exaggerated than even her L inattention to the large environment.  Transported back to room in w/c and pt requesting to remain sitting up . Therapist assisted with writing in memory notebook and noted pt had started writing information for the days coming up even though it had not happened yet, therapist attempted to reorient pt to purpose of notebook. Also, noticed pt starts writing in middle of the page rather than on L side and would benefit from a line drawn on L side of page to be visual cue for where to start. Pt left with needs in reach, family present, nurse present, and seat belt alarm on.  Therapy  Documentation Precautions:  Precautions Precautions: Fall, Other (comment) Precaution Comments: orthostatic, TEDs and ABD binder Restrictions Weight Bearing Restrictions: No Other Position/Activity Restrictions: TED hose, abdominal binder, ace wraps   Pain:  Session 1: No reports of pain throughout session.  Session 2: No reports of pain throughout session.    Therapy/Group: Individual Therapy  CTawana Scale, PT, DPT, NCS, CSRS 12/15/2022, 7:49 AM

## 2022-12-15 NOTE — Progress Notes (Addendum)
Speech Language Pathology Daily Session Note  Patient Details  Name: Leslie [name] MRN: [id number] Date of Birth: [birth year]  Today's Date: 12/15/2022 SLP Individual Time: 1130-1200 SLP Individual Time Calculation (min): 30 min  Short Term Goals: Week 1: SLP Short Term Goal 1 (Week 1): Patient will identify safe vs. unsafe scenarios with mod A verbal and/or visual cues to achieve 75% accuracy SLP Short Term Goal 2 (Week 1): Patient will generate appropriate solutions to problems with mod A verbal and/or visual cues to achieve 75% accuracy SLP Short Term Goal 3 (Week 1): Patient will be given specific scenarios (verbal and/or visual) and anticipate up to 2 ways to minimize unsafe outcomes with mod A verbal and/or visual cues SLP Short Term Goal 4 (Week 1): Patient will sustain attention to functional tasks for 3 minute duration with mod A verbal redirection cues SLP Short Term Goal 5 (Week 1): Pt will consume current diet with minimal overt s/sx of aspiration and improved self monitoring of left anterior spillage with min A reminder cues  Skilled Therapeutic Interventions: Pt seen this date for skilled ST intervention targeting deglutition and cognitive goals outlined above. Pt received awake/alert and OOB in w/c; looking at her iPad. Agreeable to intervention in hospital room. Remains confused and perseverative.    Today's session with emphasis on oral dysphagia and recall + implementation of safe swallowing strategies to include checking for L buccal and lateral sulci pocketing. Pt recalled strategies given set-up assistance for referencing external aid created yesterday, and implemented with Min A verbal cues to monitor with mirror and correct with liquid rinse and finger/lingual sweeps. No overt s/sx concerning for aspiration noted with thin liquid and Dysphagia 3 trials with implementation of safe swallowing strategies. Continue to recommend current diet textures and full  supervision.  Re: cognition, SLP provided memory notebook to further facilitate recall of daily events/information, given fluctuation in recall. SLP provided demonstration re: use by writing activities completed during today's session, as well as verbal education re: intended use. Recalled ST events with Sup A when given question prompts and notebook, and wrote 1 additional event given Min A verbal cues. Pt verbalized appreciation for notebook and sign placed outside of door to encourage staff to utilize; secure Epic message also sent to interdisciplinary team.  Pt left in room and OOB in w/c with all safety measures activated and call bell within reach. Continue per current ST POC.   Pain No pain reported; NAD  Therapy/Group: Individual Therapy  Eriko Economos A Elmer Boutelle 12/15/2022, 1:22 PM

## 2022-12-16 DIAGNOSIS — I6329 Cerebral infarction due to unspecified occlusion or stenosis of other precerebral arteries: Secondary | ICD-10-CM | POA: Diagnosis not present

## 2022-12-16 MED ORDER — ESCITALOPRAM OXALATE 10 MG PO TABS
5.0000 mg | ORAL_TABLET | Freq: Every day | ORAL | Status: DC
  Administered 2022-12-16 – 2022-12-25 (×9): 5 mg via ORAL
  Filled 2022-12-16 (×9): qty 1

## 2022-12-16 MED ORDER — QUETIAPINE FUMARATE 25 MG PO TABS
25.0000 mg | ORAL_TABLET | Freq: Every day | ORAL | Status: DC | PRN
  Administered 2022-12-16 – 2022-12-19 (×4): 25 mg via ORAL
  Filled 2022-12-16 (×4): qty 1

## 2022-12-16 NOTE — Progress Notes (Addendum)
PROGRESS NOTE   Subjective/Complaints: Patient asks if IV can be removed, medications reviewed and nothing ordered IV, have requested nursing to remove Daughter at bedside would like to ensure d/c date is 1/17  ROS:  Denies abd pain, N/V, no breathing issues   Objective:   No results found. No results for input(s): "WBC", "HGB", "HCT", "PLT" in the last 72 hours.  No results for input(s): "NA", "K", "CL", "CO2", "GLUCOSE", "BUN", "CREATININE", "CALCIUM" in the last 72 hours.   Intake/Output Summary (Last 24 hours) at 12/16/2022 1112 Last data filed at 12/16/2022 0808 Gross per 24 hour  Intake 1194 ml  Output --  Net 1194 ml         Physical Exam: Vital Signs Blood pressure 128/70, pulse 67, temperature 98.5 F (36.9 C), temperature source Oral, resp. rate 19, height '5\' 7"'$  (1.702 m), weight 64 kg, SpO2 100 %.  Gen: no distress, normal appearing HEENT: oral mucosa pink and moist, NCAT Cardio: Reg rate Chest: normal effort, normal rate of breathing Abd: soft, non-distended Ext: no edema Psych: pleasant, normal affect Neurologic: oriented to person. Cranial nerves II through XII intact, motor strength is 4/5 in bilateral deltoid, bicep, tricep, grip, hip flexor, knee extensors, ankle dorsiflexor and plantar flexor, +confusion regarding medications and where she is at times  Musculoskeletal: Full range of motion in all 4 extremities. No joint swelling   Assessment/Plan: 1. Functional deficits which require 3+ hours per day of interdisciplinary therapy in a comprehensive inpatient rehab setting. Physiatrist is providing close team supervision and 24 hour management of active medical problems listed below. Physiatrist and rehab team continue to assess barriers to discharge/monitor patient progress toward functional and medical goals  Care Tool:  Bathing    Body parts bathed by patient: Right arm, Left arm, Chest,  Abdomen, Front perineal area, Right upper leg, Left upper leg, Face   Body parts bathed by helper: Right lower leg, Buttocks, Left lower leg     Bathing assist Assist Level: Moderate Assistance - Patient 50 - 74%     Upper Body Dressing/Undressing Upper body dressing   What is the patient wearing?: Pull over shirt    Upper body assist Assist Level: Minimal Assistance - Patient > 75%    Lower Body Dressing/Undressing Lower body dressing      What is the patient wearing?: Pants     Lower body assist Assist for lower body dressing: Moderate Assistance - Patient 50 - 74%     Toileting Toileting    Toileting assist Assist for toileting: Moderate Assistance - Patient 50 - 74%     Transfers Chair/bed transfer  Transfers assist     Chair/bed transfer assist level: Minimal Assistance - Patient > 75% Chair/bed transfer assistive device: Programmer, multimedia   Ambulation assist      Assist level: Minimal Assistance - Patient > 75% Assistive device: Walker-rolling Max distance: 58f   Walk 10 feet activity   Assist     Assist level: Minimal Assistance - Patient > 75% Assistive device: Walker-rolling   Walk 50 feet activity   Assist    Assist level: Minimal Assistance - Patient > 75% Assistive  device: Walker-rolling    Walk 150 feet activity   Assist Walk 150 feet activity did not occur: Safety/medical concerns (impaired endurance)         Walk 10 feet on uneven surface  activity   Assist Walk 10 feet on uneven surfaces activity did not occur: Safety/medical concerns         Wheelchair     Assist Is the patient using a wheelchair?: Yes (used for transportation) Type of Wheelchair: Manual    Wheelchair assist level: Dependent - Patient 0%      Wheelchair 50 feet with 2 turns activity    Assist        Assist Level: Dependent - Patient 0%   Wheelchair 150 feet activity     Assist      Assist Level:  Dependent - Patient 0%   Blood pressure 128/70, pulse 67, temperature 98.5 F (36.9 C), temperature source Oral, resp. rate 19, height '5\' 7"'$  (1.702 m), weight 64 kg, SpO2 100 %.  Medical Problem List and Plan: 1. 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   Continue CIR 2.  Antithrombotics: -DVT/anticoagulation:  Pharmaceutical: Eliquis             -antiplatelet therapy: N/A 3. Pain Management: Tylenol as needed 4. Paranoia: patient self-reports, discussed sundowning in hospital with daughter 5. Neuropsych/cognition: This patient is capable of making personal healthcare with assist but not financial decisions on her own behalf  Has clinical an dimaging study evidence of vascular dementia 6. Skin/Wound Care: Routine skin checks 7. Fluids/Electrolytes/Nutrition: Routine in and outs with follow-up chemistries 8.  Atrial fibrillation.  Continue amiodarone 200 mg daily.  Eliquis as directed.  Cardiac rate controlled 9.  Orthostasis improved .  Continue ProAmatine,  Increased to TID as pt still has orthostatic drops with mild symptoms, discussed with patient and daughter 69.  Hypothyroidism.  Continue Synthroid  11.  Constipation- benign abd exam, on colace, add Miralax  12. Agitation/calling daughter multiple times daily: will order seroquel '25mg'$  daily prn to help calm patient 13. Chronic anxiety: start lexapro '5mg'$  HS  LOS: 6 days A FACE TO FACE EVALUATION WAS PERFORMED  Leslie [name] 12/16/2022, 11:12 AM

## 2022-12-17 DIAGNOSIS — I6329 Cerebral infarction due to unspecified occlusion or stenosis of other precerebral arteries: Secondary | ICD-10-CM | POA: Diagnosis not present

## 2022-12-17 NOTE — Progress Notes (Signed)
Physical Therapy Session Note  Patient Details  Name: Leslie [name] MRN: [id number] Date of Birth: [birth year]  Today's Date: 12/17/2022 PT Individual Time: 1302-1408 PT Individual Time Calculation (min): 66 min   Short Term Goals: Week 1:  PT Short Term Goal 1 (Week 1): Pt will perform supine<>sit with supervision consistently PT Short Term Goal 2 (Week 1): Pt will perform sit<>stands using LRAD with CGA consistently PT Short Term Goal 3 (Week 1): Pt will perform bed<>chair transfers using LRAD with CGA consistently PT Short Term Goal 4 (Week 1): Pt will ambulate at least 151f using LRAD with CGA PT Short Term Goal 5 (Week 1): Pt will navigate 4 steps using HRs with CGA   Skilled Therapeutic Interventions/Progress Updates:  Patient supine in bed on entrance to room and eating lunch. Patient alert and agreeable to PT session after she eats more lunch. Pt encouraged and refocused for attention to lunch plate in order to improve intake as pt noted to "play" with food on plate by pushing around with fork.   Many written notes around pt stating that she requires assistance to return to bed.   Patient with no pain complaint at start of session.  RN request for orthostatic vitals during session. Abdominal binder not located during session and performed without.   Therapeutic Activity: Bed Mobility: Pt performed supine <> sit with supervision. VC/ tc required for remaining seated prior to impulsive move to stand upon reaching EOB.  Transfers: Pt performed sit<>stand transfers throughout session with CGA for balance and safety. Provided verbal cues for push from bed as pt reaching for RW in order to stand. Pt guided in BP readings in supine, seated and standing positions (at 1 and 5 min). Please see readings taken below. Pt with difficulty remaining in stance with several moves to return to seated position. Addressed with redirection and focus on completing BP readings for RN/ MD team.    Neuromuscular Re-ed: NMR facilitated during session with focus on standing balance and safety with transfers/ surroundings. Pt guided in forward lean  for appropriate weight shift with NDT facilitation at ribcage for improved technique. VC throughout for use of L hand to push at bed surface. NMR performed for improvements in motor control and coordination, balance, sequencing, judgement, and self confidence/ efficacy in performing all aspects of mobility at highest level of independence.   Patient supine at end of session with brakes locked, bed alarm set, and all needs within reach. Pt noted to attempt to get out of bed when noticing shoes at bedside.    Therapy Documentation Precautions:  Precautions Precautions: Fall, Other (comment) Precaution Comments: orthostatic, TEDs and ABD binder Restrictions Weight Bearing Restrictions: No Other Position/Activity Restrictions: TED hose, abdominal binder, ace wraps General:   Vital Signs: Orthostatic VS for the past 24 hrs:  BP- Lying Pulse- Lying BP- Sitting Pulse- Sitting BP- Standing at 0 minutes Pulse- Standing at 0 minutes  12/17/22 1300 137/74 66 121/79 72 96/73 91   BP-Standing (5 min): 85/56 Pulse-Standing (5 min): 97   Pain:  Back pain related this session with time in stance. Addressed with repositioning in seated position for relief prior to return to stance.   Therapy/Group: Individual Therapy  JAlger SimonsPT, DPT, CSRS 12/17/2022, 1:59 PM

## 2022-12-17 NOTE — Progress Notes (Signed)
PROGRESS NOTE   Subjective/Complaints: Moved her bowels this am , discussed left neglect, MD f/u with PCP, Neuro, Cards and PMR as well as ongoing rehab needs post discharge   ROS:  Denies abd pain, N/V, no breathing issues   Objective:   No results found. No results for input(s): "WBC", "HGB", "HCT", "PLT" in the last 72 hours.  No results for input(s): "NA", "K", "CL", "CO2", "GLUCOSE", "BUN", "CREATININE", "CALCIUM" in the last 72 hours.   Intake/Output Summary (Last 24 hours) at 12/17/2022 1009 Last data filed at 12/17/2022 0818 Gross per 24 hour  Intake 477 ml  Output --  Net 477 ml          Physical Exam: Vital Signs Blood pressure (!) 141/89, pulse 71, temperature (!) 97.5 F (36.4 C), temperature source Oral, resp. rate 17, height '5\' 7"'$  (1.702 m), weight 64 kg, SpO2 97 %.  Gen: no distress, normal appearing HEENT: oral mucosa pink and moist, NCAT Cardio: Reg rate Chest: normal effort, normal rate of breathing Abd: soft, non-distended Ext: no edema Psych: pleasant, normal affect Neurologic: oriented to person. Cranial nerves II through XII intact, motor strength is 4/5 in bilateral deltoid, bicep, tricep, grip, hip flexor, knee extensors, ankle dorsiflexor and plantar flexor,  Oriented to person , place month and year  Musculoskeletal: Full range of motion in all 4 extremities. No joint swelling   Assessment/Plan: 1. Functional deficits which require 3+ hours per day of interdisciplinary therapy in a comprehensive inpatient rehab setting. Physiatrist is providing close team supervision and 24 hour management of active medical problems listed below. Physiatrist and rehab team continue to assess barriers to discharge/monitor patient progress toward functional and medical goals  Care Tool:  Bathing    Body parts bathed by patient: Right arm, Left arm, Chest, Abdomen, Front perineal area, Right upper leg,  Left upper leg, Face   Body parts bathed by helper: Right lower leg, Buttocks, Left lower leg     Bathing assist Assist Level: Moderate Assistance - Patient 50 - 74%     Upper Body Dressing/Undressing Upper body dressing   What is the patient wearing?: Pull over shirt    Upper body assist Assist Level: Minimal Assistance - Patient > 75%    Lower Body Dressing/Undressing Lower body dressing      What is the patient wearing?: Pants     Lower body assist Assist for lower body dressing: Moderate Assistance - Patient 50 - 74%     Toileting Toileting    Toileting assist Assist for toileting: Moderate Assistance - Patient 50 - 74%     Transfers Chair/bed transfer  Transfers assist     Chair/bed transfer assist level: Minimal Assistance - Patient > 75% Chair/bed transfer assistive device: Programmer, multimedia   Ambulation assist      Assist level: Minimal Assistance - Patient > 75% Assistive device: Walker-rolling Max distance: 41f   Walk 10 feet activity   Assist     Assist level: Minimal Assistance - Patient > 75% Assistive device: Walker-rolling   Walk 50 feet activity   Assist    Assist level: Minimal Assistance - Patient > 75% Assistive  device: Walker-rolling    Walk 150 feet activity   Assist Walk 150 feet activity did not occur: Safety/medical concerns (impaired endurance)         Walk 10 feet on uneven surface  activity   Assist Walk 10 feet on uneven surfaces activity did not occur: Safety/medical concerns         Wheelchair     Assist Is the patient using a wheelchair?: Yes (used for transportation) Type of Wheelchair: Manual    Wheelchair assist level: Dependent - Patient 0%      Wheelchair 50 feet with 2 turns activity    Assist        Assist Level: Dependent - Patient 0%   Wheelchair 150 feet activity     Assist      Assist Level: Dependent - Patient 0%   Blood pressure (!)  141/89, pulse 71, temperature (!) 97.5 F (36.4 C), temperature source Oral, resp. rate 17, height '5\' 7"'$  (1.702 m), weight 64 kg, SpO2 97 %.  Medical Problem List and Plan: 1. 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  Antithrombotics: -DVT/anticoagulation:  Pharmaceutical: Eliquis             -antiplatelet therapy: N/A 3. Pain Management: Tylenol as needed 4. Paranoia: patient self-reports, discussed sundowning in hospital with daughter 5. Neuropsych/cognition: This patient is capable of making personal healthcare with assist but not financial decisions on her own behalf  Has clinical an dimaging study evidence of vascular dementia 6. Skin/Wound Care: Routine skin checks 7. Fluids/Electrolytes/Nutrition: Routine in and outs with follow-up chemistries 8.  Atrial fibrillation.  Continue amiodarone 200 mg daily.  Eliquis as directed.  Cardiac rate controlled 9.  Orthostasis improved .  Continue ProAmatine,  Increased to TID as pt still has orthostatic drops with mild symptoms, however no sys BPs<22mHg have been recorded   10.  Hypothyroidism.  Continue Synthroid  11.  Constipation- benign abd exam, on colace, add Miralax  LBM this am 1/8 , nausea better  12. Agitation/calling daughter multiple times daily: will order seroquel '25mg'$  daily prn to help calm patient 13. Chronic anxiety: start lexapro '5mg'$  HS  LOS: 7 days A FACE TO FACE EVALUATION WAS PERFORMED  ACharlett Blake1/07/2023, 10:09 AM

## 2022-12-17 NOTE — Progress Notes (Signed)
Occupational Therapy Session Note  Patient Details  Name: Leslie [name] MRN: [id number] Date of Birth: 1941-09-07  Today's Date: 12/17/2022 OT Individual Time: 1000-1100 OT Individual Time Calculation (min): 60 min    Short Term Goals: Week 1:  OT Short Term Goal 1 (Week 1): Pt will attend to L visual field/side of body during functional activities with min verbal cues OT Short Term Goal 2 (Week 1): Pt will tolerate standing using LRAD during functional activties/BADLs for 3 minutes OT Short Term Goal 3 (Week 1): Pt will complete toilet transfer CGA using LRAD  Skilled Therapeutic Interventions/Progress Updates:     Pt received sitting up in recliner with thigh high TED hose donned presenting to be in good spirits and receptive to skilled OT session. Pt reporting 0/10 pain and looking forward to therapy session.   Vitals taken in sitting at beginning of session. Vitals in sitting BP 128/87 Hr 68. Pt motivated by seeing higher number on monitor. Reviewed today's therapy session with Pt to support moral and structure for the day. Pt inquiring about follow up therapy and worry of losing progress she had made so far during hospital stay- Pt provided education on follow up therapy options and therapeutic support with improved moral noted.   Pt completed seated therapeutic dance activity to increase blood flow prior to ambulation. Pt able to mirror therapist movements to Pt desired song with mod verbal cues required to facilitate Pt attention to task.   Sit>stand using RW CGA with mod verbal cues for hand placement. Functional mobility in room using RW ambulating to wc CGA with min A for RW management when turning to sit down and mod verbal cues for safety.   Pt propelled her wc down straight hallway for functional mobility and endurance training. Pt required mod verbal cues to utilize L arm during task and to facilitate equal propulsion with R/LUEs min A to avoid obstacles on L side.   Pt  completed standing visual scanning task in ADL apartment. Pt noted to miss items placed on Pt L side ~25% of the time requiring mod questioning cues to locate items. Pt required mod cues for attention d/t becoming distracted by other items located in kitchen and perseverating on telling OT what she does in her kitchen at home. Pt able to tolerate ~6 minutes of standing without reporting dizziness or sleepiness symptoms. Pt transported back to room total A in wc. Pt requested to return to recliner at end of session. Pt completed stand step transfer to recliner with max verbal and gestural cues d/t Pt not seeing/forgetting location of recliner to her L upon standing. OT assisted Pt with writing down therapy activities in memory note book per SLP request. Pt was left resting in recliner with call bell in reach, chair alarm on, and all needs met.   Therapy Documentation Precautions:  Precautions Precautions: Fall, Other (comment) Precaution Comments: orthostatic, TEDs and ABD binder Restrictions Weight Bearing Restrictions: No Other Position/Activity Restrictions: TED hose, abdominal binder, ace wraps General:   Vital Signs:  Pain: Pain Assessment Pain Scale: 0-10 Pain Score: 0-No pain ADL: ADL Eating: Supervision/safety Where Assessed-Eating: Bed level Grooming: Moderate assistance Where Assessed-Grooming: Bed level Upper Body Bathing: Minimal assistance Where Assessed-Upper Body Bathing: Bed level Lower Body Bathing: Moderate assistance Where Assessed-Lower Body Bathing: Bed level Upper Body Dressing: Minimal assistance Where Assessed-Upper Body Dressing: Bed level Lower Body Dressing: Minimal assistance Where Assessed-Lower Body Dressing: Bed level Toileting: Moderate assistance Where Assessed-Toileting: Bedside Commode Toilet Transfer:  Minimal assistance Toilet Transfer Method: Stand pivot Toilet Transfer Equipment: Engineer, technical sales Transfer: Not assessed Press photographer: Not assessed   Therapy/Group: Individual Therapy  Janey Genta 12/17/2022, 10:20 AM

## 2022-12-17 NOTE — Progress Notes (Signed)
Occupational Therapy Session Note  Patient Details  Name: Leslie [name] MRN: [id number] Date of Birth: Apr 01, 1941  Today's Date: 12/17/2022 OT Individual Time: 1655-3748 OT Individual Time Calculation (min): 78 min    Short Term Goals: Week 1:  OT Short Term Goal 1 (Week 1): Pt will attend to L visual field/side of body during functional activities with min verbal cues OT Short Term Goal 2 (Week 1): Pt will tolerate standing using LRAD during functional activties/BADLs for 3 minutes OT Short Term Goal 3 (Week 1): Pt will complete toilet transfer CGA using LRAD  Skilled Therapeutic Interventions/Progress Updates:  Pt greeted on toilet with NT present, pt agreeable to OT intervention. Session focus on BADL reeducation, functional mobility, dynamic standing balance, L sided attenton and decreasing overall caregiver burden.           Pt unable to have BM but reports need to void bowels, asking for suppository, pt exited bathroom via ambulatory toilet transfer with MIN A needing assist to mange RW. Pt donned pants and brief from w/c with MIN A, pt able to figure 4 bilateral LEs to dons pants/brief but needed assist to remember to pull up brief prior to pants. Pt then ambulated back to bed and states " I am going to need something to poop before I can do anything. "  Alerted nurse.    RN entered to begin to prep meds, attempted to take Orthostatic vitals however pt standing up and laying back down d/t pain in her butt despite cues.  BP- 101/62(75)  Donned teds while pt tooks meds.   Pt then reports need to void bowels again with pt able to ambulate back into bathroom with Rw and MINA, this time pt with + BM.pt able completed pericare via lateral leans with CGA. Pt needed assist to pull up pants on L side.   Pt ambulated to sink for hand hygiene and able to stand at sink for hand washing with CGA with no UE support. Total A transport to gym where pt completed seated bell cancellation test. Pt  needed multimodal cues to locate any bells on L side of screen. Noted alternating attention during task needed cues to maintain focused attention. Pt missed 3 on L side and hit 7 distractors, in 10 mins   Ended session with pt seated in recliner with all needs within reach and safety belt alarm activated.                    Therapy Documentation Precautions:  Precautions Precautions: Fall, Other (comment) Precaution Comments: orthostatic, TEDs and ABD binder Restrictions Weight Bearing Restrictions: No Other Position/Activity Restrictions: TED hose, abdominal binder, ace wraps General:   Vital Signs:  Pain: Unrated pain reported in bottom from urge to void bowels. Rest breaks, repositioning and multiple attempts to void bowels provided.    Therapy/Group: Individual Therapy  Corinne Ports [facility] 12/17/2022, 10:42 AM

## 2022-12-18 MED ORDER — MIDODRINE HCL 5 MG PO TABS
5.0000 mg | ORAL_TABLET | Freq: Two times a day (BID) | ORAL | Status: DC
  Administered 2022-12-18 – 2022-12-26 (×17): 5 mg via ORAL
  Filled 2022-12-18 (×17): qty 1

## 2022-12-18 NOTE — Progress Notes (Signed)
Physical Therapy Weekly Progress Note  Patient Details  Name: Leslie [name] MRN: [id number] Date of Birth: 04-26-1941  Beginning of progress report period: December 11, 2022 End of progress report period: December 18, 2022  Today's Date: 12/18/2022 PT Individual Time: 2122-4825 and 0037-0488 PT Individual Time Calculation (min): 54 min  and 18 min and  Today's Date: 12/18/2022 PT Missed Time: 42 Minutes Missed Time Reason: Pain  Patient has met 4 of 5 short term goals. Leslie [name] is progressing well with therapy demonstrating increasing independence with functional mobility and improved management of her orthostatic hypotension via medications and use of thigh high TED hose and abdominal binder when mobilizing. She is performing supine<>sit with supervision, sit<>stands and stand pivot transfers using RW with CGA, and ambulating up to 170f using RW with CGA as well as navigating 8 steps using HRs with min assist. She continues to have significant cognitive impairments impacting her recall/carryover of education/training as well as impacting her attention and awareness. She will benefit from continued CIR level skilled physical therapy prior to D/Cing home with 24hr support from her family.  Patient continues to demonstrate the following deficits muscle weakness, decreased cardiorespiratoy endurance, impaired timing and sequencing and unbalanced muscle activation, decreased attention to left, decreased initiation, decreased attention, decreased awareness, decreased problem solving, decreased safety awareness, decreased memory, and delayed processing, and decreased standing balance, decreased postural control, and decreased balance strategies and therefore will continue to benefit from skilled PT intervention to increase functional independence with mobility.  Patient progressing toward long term goals..  Continue plan of care.  PT Short Term Goals Week 1:  PT Short Term Goal 1 (Week 1): Pt  will perform supine<>sit with supervision consistently PT Short Term Goal 1 - Progress (Week 1): Met PT Short Term Goal 2 (Week 1): Pt will perform sit<>stands using LRAD with CGA consistently PT Short Term Goal 2 - Progress (Week 1): Met PT Short Term Goal 3 (Week 1): Pt will perform bed<>chair transfers using LRAD with CGA consistently PT Short Term Goal 3 - Progress (Week 1): Met PT Short Term Goal 4 (Week 1): Pt will ambulate at least 1043fusing LRAD with CGA PT Short Term Goal 4 - Progress (Week 1): Met PT Short Term Goal 5 (Week 1): Pt will navigate 4 steps using HRs with CGA PT Short Term Goal 5 - Progress (Week 1): Progressing toward goal Week 2:  PT Short Term Goal 1 (Week 2): = to LTGs based on ELOS  Skilled Therapeutic Interventions/Progress Updates:  Ambulation/gait training;Community reintegration;DME/adaptive equipment instruction;Neuromuscular re-education;Psychosocial support;Stair training;UE/LE Strength taining/ROM;Wheelchair propulsion/positioning;UE/LE Coordination activities;Therapeutic Activities;Skin care/wound management;Pain management;Discharge planning;Balance/vestibular training;Cognitive remediation/compensation;Disease management/prevention;Functional mobility training;Patient/family education;Splinting/orthotics;Therapeutic Exercise;Visual/perceptual remediation/compensation  Session 1: Pt received supine in bed, awake and agreeable to therapy session. Donned B LE thigh high TED hose total assist and pants set-up assist. Supine>sitting R EOB, HOB flat but using bedrail, with supervision. Sitting EOB, donned abdominal binder total assist to ensure proper placement.   Sit>stand EOB>RW requiring cuing to push up with both hands and requires a few attempts to come to standing fully due to poor ability to power up - CGA for safety. L stand pivot to w/c using RW with CGA for steadying and cuing to turn fully prior to initiating sitting.    Pt perseveratively talking about  a concern with her daughter, therapist comforted pt and encouraged her not to continue worrying/stressing until she knows the facts. Redirected attention to tasks at hand during session.  Transported to/from  gym in w/c for time management and energy conservation.  Sitting in w/c to start session: BP 126/72 (MAP 86), HR 88bpm   Gait ~57f x2 using RW with CGA - slow gait speed with short but min reciprocal stepping pattern, slight forward trunk flexion - pt needing to sit and rest at end of 422f  Vitals after 2nd gait: BP 100/61 (MAP 71), HR 99bpm  MD in/out for morning assessment and discussed BP.  Gait training 8545fsing RW with continued slow gait speed, slight forward trunk flexion, and small but reciprocal stepping pattern - Vitals after: BP 85/51 (MAP 63), HR 105bpm - discovered pt has not yet received her morning medications with midodrine.  Pt reports urgent need to use bathroom.  Transported back to room.  R squat/partial stand pivot w/c>BSC over toilet using UE support on grab bars with CGA and dependent assist for LB clothing management due to urgency. Continent of bladder and bowels - charted. Pt performed seated peri-care and declined therapist assistance to ensure cleanliness.L squat/partial stand pivot back to w/c with CGA.   Seated hand hygiene at sink. Reassessed vitals: BP 102/69 (MAP 80), HR 97bpm   Pt left seated in w/c at sink completing oral care with seat belt alarm on and nurse present to assume care of pt.   Session 2: Pt received sitting in recliner having finished her lunch and noticed to have significant amount of food spillage on her shirt. Pt initially agreeable to participate in session. Pt doffed dirty shirt without assist and donned clean shirt with max cuing for proper orientation. Pt reports she is having significant back pain and states she needs to lay down in the bed for pain management stating sometimes that is the only thing that helps. Therapist  attempts to encourage patient to participate in therapy session as she never refuses; however, pt continues to politely decline due to back pain. Nurse notified for medication administration. Stand pivot recliner>EOB, light R HHA, with CGA for steadying. Sit>supine with supervision. Pt left supine in bed with needs in reach, HOB elevated, and bed alarm on. Missed 42 minutes of skilled physical therapy.  Therapy Documentation Precautions:  Precautions Precautions: Fall, Other (comment) Precaution Comments: orthostatic, TEDs and ABD binder Restrictions Weight Bearing Restrictions: No Other Position/Activity Restrictions: TED hose, abdominal binder, ace wraps   Pain: Session 1: Reports B LE pain described as feeling "weak and overused" - provided seated rest breaks for pain management - anticipated associated with decreased BP.  Session 2: Back pain - details above.   Therapy/Group: Individual Therapy  CarTawana ScalePT, DPT, NCS, CSRS 12/18/2022, 7:44 AM

## 2022-12-18 NOTE — Progress Notes (Signed)
Speech Language Pathology Daily Session Note  Patient Details  Name: Leslie [name] MRN: [id number] Date of Birth: Nov 21, 1941  Today's Date: 12/18/2022 SLP Individual Time: 22-1445 SLP Individual Time Calculation (min): 30 min  Short Term Goals: Week 1: SLP Short Term Goal 1 (Week 1): Patient will identify safe vs. unsafe scenarios with mod A verbal and/or visual cues to achieve 75% accuracy SLP Short Term Goal 2 (Week 1): Patient will generate appropriate solutions to problems with mod A verbal and/or visual cues to achieve 75% accuracy SLP Short Term Goal 3 (Week 1): Patient will be given specific scenarios (verbal and/or visual) and anticipate up to 2 ways to minimize unsafe outcomes with mod A verbal and/or visual cues SLP Short Term Goal 4 (Week 1): Patient will sustain attention to functional tasks for 3 minute duration with mod A verbal redirection cues SLP Short Term Goal 5 (Week 1): Pt will consume current diet with minimal overt s/sx of aspiration and improved self monitoring of left anterior spillage with min A reminder cues  Skilled Therapeutic Interventions: Skilled ST treatment focused on cognitive goals. Pt was greeted in bed on arrival and agreeable ST. Pt performed stand pivot transfer to w/c using RW. Pt required sup A verbal cues to wait until SLP gave instruction to stand due to multiple attempts to stand without feet planted firmly on ground and without RW in place.  Session took place in speech therapy office with minimal external distractions.   Pt was verbose and required mod A verbal redirection cues for attention to task and topic maintenance throughout session due to perseveration on many different topics.   SLP reviewed pt's memory notebook. Patient had written down events from today's therapy sessions, however, appeared to begin writing things down under headings "Wednesday January 10th" progressing all the way through Friday 1/12 which is several days away. SLP  facilitated orientation to date and time using her phone home screen with sup A verbal cues. Pt exhibited difficulty recalling date following >2 minute delay and was most successful within 30 second to 1 minute intervals due to significance of internal distractions. SLP also utilized calendar to orient to date however this did not appear to be a beneficial strategy, as pt would randomly cross out days in the future that have not yet occurred which caused her to believe it was actually later in the week.   Given verbal scenarios by SLP, pt identified safe vs. unsafe behaviors with mod A verbal cues for problem solving and safety consideration. Pt was able to generate appropriate solutions to problems with mod A verbal cues and 80% accuracy.   SLP returned pt to her room and reinforced strategy to use pt's home screen on phone to help pt orient to time and date. Also reinforced use of memory notebook.   Patient transferred from w/c to bed with mod A verbal cues for safety, as pt attempted to stand multiple times before wheelchair was locked and SLP was in place. Pt was adamant that she was able to stand on her own since she practiced with PT earlier. SLP reinforced safety precautions and explained that even though she is working on something in therapy, it is still essential someone is with her for all transfers and functional mobility tasks. Pt verbalized understanding. Pt left with alarm activated and immediate needs within reach at end of session. Continue per current plan of care.      Pain None  Therapy/Group: Individual Therapy  Solimar Maiden T Elliot Dally  12/18/2022, 2:42 PM

## 2022-12-18 NOTE — Progress Notes (Signed)
Occupational Therapy Weekly Progress Note  Patient Details  Name: Leslie [name] MRN: [id number] Date of Birth: March 26, 1941  Beginning of progress report period: December 10, 2021 End of progress report period: December 18, 2021  Today's Date: 12/18/2022 OT Individual Time: 4098-1191 OT Individual Time Calculation (min): 60 min    Patient has met 4 of 4 short term goals.  Pt is demonstrating increased independence in completing LB BALDs, transfers, tolerating longer periods of standing, and attending to L side of body. Pt is receiving support from her Dtr and grand Dtr who visit, ask questions, and call often.   Patient continues to demonstrate the following deficits: muscle weakness, decreased cardiorespiratoy endurance, impaired timing and sequencing, motor apraxia, decreased coordination, and decreased motor planning, decreased attention to left and decreased motor planning, decreased initiation, decreased attention, decreased awareness, decreased problem solving, decreased safety awareness, decreased memory, and delayed processing, central origin, and decreased sitting balance, decreased standing balance, decreased postural control, and decreased balance strategies and therefore will continue to benefit from skilled OT intervention to enhance overall performance with BADL, iADL, and Reduce care partner burden.  Patient progressing toward long term goals..  Continue plan of care.  OT Short Term Goals Week 1:  OT Short Term Goal 1 (Week 1): Pt will attend to L visual field/side of body during functional activities with min verbal cues OT Short Term Goal 1 - Progress (Week 1): Met OT Short Term Goal 2 (Week 1): Pt will tolerate standing using LRAD during functional activties/BADLs for 3 minutes OT Short Term Goal 2 - Progress (Week 1): Met OT Short Term Goal 3 (Week 1): Pt will complete toilet transfer CGA using LRAD OT Short Term Goal 3 - Progress (Week 1): Met OT Short Term Goal 4 -  Progress (Week 1): Met Week 2:  OT Short Term Goal 1 (Week 2): STG=LTG (d/t pt lenght of stay)  Skilled Therapeutic Interventions/Progress Updates:     Pt received sitting up in bed presenting agitated, but receptive to skilled OT session. Pt reporting 0/10 pain and receptive to completing a shower during session today. OT inquiring about Pt feeling agitated with Pt stating "I've got to call my Dtr and I don't know how; I have too many questions about things going on at home." Pt provided maximal  therapeutic support and listening, but unable to be redirected. Pt educated on her to call her DTR using personal phone. Pt called DTR verbalizing her concerns with DTR informing Pt that she was just dreaming and everything is "okay" at home. Improvement in Pt moral noted following phone conversation. Pt continued to perseverate on topic during session, but was redirectable with min cueing.   Pt receptive to taking shower during today's session. Focus this session BADL retraining with a focus on safety, attending to L side of body, and AE education. Pt able to complete squat pivot transfers bed>wc>tub bench with CGA without AD and mod cueing for technique and hand placement. Pt able to tolerate seated shower without TEDs donned and no nausea symptoms reported. Pt completed shower at overall supervision level for UB and LB CGA to maintain dynamic sitting balance. Pt educated on using long handled sponge for LB bathing to prevent dizziness/decrease in BP d/t bending over. Pt receptive of use and demonstrating teach back as evidence of learning. Pt attending to L side of body and L visual field during shower with min verbal cues. Mod verbal cues required for safety during shower to remind Pt to  remain seated and not initiate transfer prior to therapist being prepared.  Pt completed dressing tasks seated EOB. Pt able to donn shirt supervision. TEDs donned on Pt max A. Pt able to donn socks, shoes, and pants with CGA  when bringing pants to waist. Pt instructed to don jacket with mod verbal cues required for Pt to attend to task +time d/t becoming distracted by bed sheets, thoughts, or asking therapist questions. Sit>stand>recliner using RW CGA with min verbal cues for safety. Pt recalled events from today's session with min questioning cues and documented events down in memory note book.  Pt moderately impulsive during session today- spontaneously sitting on bed during LB dressing x2 and attempting transfers prior to therapist instruction. Pt was left resting in recliner with call bell in reach, seat belt alarm on, and all needs met.   Therapy Documentation Precautions:  Precautions Precautions: Fall, Other (comment) Precaution Comments: orthostatic, TEDs and ABD binder Restrictions Weight Bearing Restrictions: No Other Position/Activity Restrictions: TED hose, abdominal binder, ace wraps General:   Vital Signs: Therapy Vitals Pulse Rate: 97 BP: 102/69 Patient Position (if appropriate): Sitting Oxygen Therapy SpO2: 99 % O2 Device: Room Air Pain: Pain Assessment Pain Scale: 0-10 Pain Score: 0-No pain ADL: ADL Eating: Supervision/safety Where Assessed-Eating: Bed level Grooming: Moderate assistance Where Assessed-Grooming: Bed level Upper Body Bathing: Minimal assistance Where Assessed-Upper Body Bathing: Bed level Lower Body Bathing: Moderate assistance Where Assessed-Lower Body Bathing: Bed level Upper Body Dressing: Minimal assistance Where Assessed-Upper Body Dressing: Bed level Lower Body Dressing: Minimal assistance Where Assessed-Lower Body Dressing: Bed level Toileting: Moderate assistance Where Assessed-Toileting: Bedside Commode Toilet Transfer: Minimal assistance Toilet Transfer Method: Stand pivot Toilet Transfer Equipment: Bedside commode Tub/Shower Transfer: Not assessed Social research officer, government: Not assessed   Therapy/Group: Individual Therapy  Janey Genta 12/18/2022, 12:58 PM

## 2022-12-18 NOTE — Progress Notes (Signed)
Speech Language Pathology Weekly Progress and Session Note  Patient Details  Name: Leslie [name] MRN: [id number] Date of Birth: [birth year]  Beginning of progress report period: December 10, 2022 End of progress report period: December 18, 2022  Short Term Goals: Week 1: SLP Short Term Goal 1 (Week 1): Patient will identify safe vs. unsafe scenarios with mod A verbal and/or visual cues to achieve 75% accuracy SLP Short Term Goal 1 - Progress (Week 1): Met SLP Short Term Goal 2 (Week 1): Patient will generate appropriate solutions to problems with mod A verbal and/or visual cues to achieve 75% accuracy SLP Short Term Goal 2 - Progress (Week 1): Met SLP Short Term Goal 3 (Week 1): Patient will be given specific scenarios (verbal and/or visual) and anticipate up to 2 ways to minimize unsafe outcomes with mod A verbal and/or visual cues SLP Short Term Goal 3 - Progress (Week 1): Met SLP Short Term Goal 4 (Week 1): Patient will sustain attention to functional tasks for 3 minute duration with mod A verbal redirection cues SLP Short Term Goal 4 - Progress (Week 1): Met SLP Short Term Goal 5 (Week 1): Pt will consume current diet with minimal overt s/sx of aspiration and improved self monitoring of left anterior spillage with min A reminder cues SLP Short Term Goal 5 - Progress (Week 1): Met  New Short Term Goals: Week 2: SLP Short Term Goal 1 (Week 2): STG=LTG due to ELOS  Weekly Progress Updates: Patient has made functional gains and has met 5 of 5 STGs this reporting period. Patient is currently completing cognitive tasks with overall moderate A verbal/visual cues to complete functional and basic-to-mildly-complex tasks accurately and safely. Similar to previous admission, pt continues to be limited by decreased insight into deficits, impaired recall impacting carry over, internal distraction and perseveration requiring frequent verbal redirection during sessions. Suspected pre-morbid cognitive  deficits are likely to impact level of cognitive recovery within current setting. Pt is currently consuming a dysphagia 3 diet with thin liquids and min A verbal cues to monitor and implement liquid rinse and finger/lingual sweeps to clear L buccal and lateral sulci pocketing. Recommend continuation of current diet and full supervision during meals. Patient and family education is ongoing. Patient would benefit from continued skilled SLP intervention to maximize cognitive functioning and overall functional independence prior to discharge. Recommend patient have 24 hour supervision to optimize safety and wellbeing at discharge.   Intensity: Minumum of 1-2 x/day, 30 to 90 minutes Frequency: 1 to 3 out of 7 days Duration/Length of Stay: 1/17 Treatment/Interventions: Cognitive remediation/compensation;Internal/external aids;Dysphagia/aspiration precaution training;Functional tasks;Patient/family education;Therapeutic Activities;Environmental controls   Therapy/Group: Individual Therapy  Patty Sermons 12/18/2022, 4:06 PM

## 2022-12-18 NOTE — Progress Notes (Signed)
Physical Therapy Session Note  Patient Details  Name: Leslie [name] MRN: [id number] Date of Birth: 23-Aug-1941  Today's Date: 12/18/2022 PT Individual Time: 0917-1000 PT Individual Time Calculation (min): 43 min   Short Term Goals: Week 1:  PT Short Term Goal 1 (Week 1): Pt will perform supine<>sit with supervision consistently PT Short Term Goal 1 - Progress (Week 1): Met PT Short Term Goal 2 (Week 1): Pt will perform sit<>stands using LRAD with CGA consistently PT Short Term Goal 2 - Progress (Week 1): Met PT Short Term Goal 3 (Week 1): Pt will perform bed<>chair transfers using LRAD with CGA consistently PT Short Term Goal 3 - Progress (Week 1): Met PT Short Term Goal 4 (Week 1): Pt will ambulate at least 120f using LRAD with CGA PT Short Term Goal 4 - Progress (Week 1): Met PT Short Term Goal 5 (Week 1): Pt will navigate 4 steps using HRs with CGA PT Short Term Goal 5 - Progress (Week 1): Met  Skilled Therapeutic Interventions/Progress Updates:  Pt seated in w/c on arrival and agreeable to therapy. Pt denies pain but reports an ache on her L side, premedicated. Rest and positioning provided as needed. Performed supine and standing orthostatic vitals per RN request as documented below. Pt performed supine<>sit multiple times with supervision using log roll method per previous back surgery precautions. Stand pivot transfer with HHA and min A for balance. Pt performed Sit to stand with no UE support 3 x 10 for dynamic balance, forced use of LLE and global strength. Pt reports mild nausea that fades within 2 min of sitting. Performed first sets from mat table with occ min A but cga overall. Pt performed last set from w/c for back comfort. Noted increased reliance on UE and increased fear/movement anxiety from w/c vs mat table despite same height. Pt returned to room and remained in w/c, was left with all needs in reach and alarm active.   Therapy Documentation Precautions:   Precautions Precautions: Fall, Other (comment) Precaution Comments: orthostatic, TEDs and ABD binder Restrictions Weight Bearing Restrictions: No Other Position/Activity Restrictions: TED hose, abdominal binder, ace wraps General:   Vital Signs: Standing vitals: 71/52 (62), 100 bpm -pt reports dizziness and nausea in standing Supine vitals: 126/74(90), 80 bpm -pt reports some nausea in supine, but states it might just be heart burn     Therapy/Group: Individual Therapy  OMickel Fuchs1/08/2023, 9:40 AM

## 2022-12-19 DIAGNOSIS — I6329 Cerebral infarction due to unspecified occlusion or stenosis of other precerebral arteries: Secondary | ICD-10-CM | POA: Diagnosis not present

## 2022-12-19 NOTE — Plan of Care (Signed)
  Problem: RH Bed to Chair Transfers Goal: LTG Patient will perform bed/chair transfers w/assist (PT) Description: LTG: Patient will perform bed to chair transfers with assistance (PT). Flowsheets (Taken 12/19/2022 1050) LTG: Pt will perform Bed to Chair Transfers with assistance level: (Downgraded to CGA based on pt's cognitive and awareness impairments) Contact Guard/Touching assist Note: Downgraded to CGA based on pt's cognitive and awareness impairments   Problem: RH Car Transfers Goal: LTG Patient will perform car transfers with assist (PT) Description: LTG: Patient will perform car transfers with assistance (PT). Flowsheets (Taken 12/19/2022 1050) LTG: Pt will perform car transfers with assist:: (Downgraded to CGA based on pt's cognitive and awareness impairments) Contact Guard/Touching assist Note: Downgraded to CGA based on pt's cognitive and awareness impairments   Problem: RH Ambulation Goal: LTG Patient will ambulate in controlled environment (PT) Description: LTG: Patient will ambulate in a controlled environment, # of feet with assistance (PT). Flowsheets (Taken 12/19/2022 1050) LTG: Pt will ambulate in controlled environ  assist needed:: (Downgraded to CGA based on pt's cognitive and awareness impairments) Contact Guard/Touching assist LTG: Ambulation distance in controlled environment: 183f using LRAD Note: Downgraded to CGA based on pt's cognitive and awareness impairments Goal: LTG Patient will ambulate in home environment (PT) Description: LTG: Patient will ambulate in home environment, # of feet with assistance (PT). Flowsheets (Taken 12/19/2022 1050) LTG: Pt will ambulate in home environ  assist needed:: (Downgraded to CGA based on pt's cognitive and awareness impairments) Contact Guard/Touching assist LTG: Ambulation distance in home environment: 566fusing LRAD Note: Downgraded to CGA based on pt's cognitive and awareness impairments

## 2022-12-19 NOTE — Progress Notes (Signed)
Patient ID: Joeli Fenner, female   DOB: 1941-10-23, 82 y.o.   MRN: [id number] Team Conference Report to Patient/Family  Team Conference discussion was reviewed with the patient and caregiver, including goals, any changes in plan of care and target discharge date.  Patient and caregiver express understanding and are in agreement.  The patient has a target discharge date of 12/22/22.  Sw spoke with patient daughter, [name]. Daughter back in [place] and anticipates returning to [facility] on Monday night. Daughter requesting the the d/c date remain or move to 1/16 to allow her time to get to town. Daughter will potentially try to get to [facility] but unable to confirm currently. Daughter will follow up with Sw if able to get here sooner. Daughter happy with patients progress. Patient has hospital bed set up in home and already has rolling walker. No additional questions or concerns, sw will follow up with team.   Dyanne Iha 12/19/2022, 11:24 AM

## 2022-12-19 NOTE — Progress Notes (Addendum)
Patient ID: Leslie [name], female   DOB: May 19, 1941, 82 y.o.   MRN: [id number]  [facility] referral sent to [facility]. Transport chair and BSC ordered through [employer].

## 2022-12-19 NOTE — Patient Care Conference (Signed)
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 Alysia Penna Social Worker Present: Erlene Quan, BSW Nurse Present: Dorien Chihuahua, RN PT Present: Page Spiro, PT OT Present: Other (comment) Lowella Fairy, OT) SLP Present: Sherren Kerns, SLP PPS Coordinator present : Gunnar Fusi, SLP     Current Status/Progress Goal Weekly Team Focus  Bowel/Bladder   Pt is continent, but have occasional incontinent episodes. LBM: 1/9   Regain full continence of b/b   Assess w/ toileting needs q 2-4 hours & PRN.    Swallow/Nutrition/ Hydration   sup A   sup A  tolerance of current diet with awareness of anterior spillage, implementation of lingual sweep    ADL's   UB bathing/dressing supervision, LB bathing/dressing CGA when standing, toileting min A for posterior peri-care, shower/toilet transfer CGA using RW or squat pivot from wc; continues to have L innattention, but Pt demonstraitng improved insight to deficit; Pt perseverating during session, but more easily redirectable compared to previous week   Supervision overall, CGA bathing, min A shower transfer and toileting d/t cog deficits   BADL retraining, LUE NMR, d/c planning, DME and AE training, functional mobility and transfer  training, therapeutic use of self, increasing attention to L side, dynamic standing balance, BP management    Mobility   supervision supine<>sit, CGA sit<>stand and stand pivot transfers using RW, gait up to 176f using RW with CGA, 8 stair navigation using B HRs with min assist - BP controlled when mobilizing on medications and wearing abdominal binder and B LE thigh high TED hose   downgraded to CGA overall ambulating due to cognitive impairments  pt/family education, activity tolerance, BP management, bed mobility training, transfer training, gait training, dynamic standing balance, DME training, stair navigation training    Communication                Safety/Cognition/ Behavioral Observations  mod A   min A   problem solving, intellectual/emergent awareness, attention,    Pain   Pt denies pain   Remain pain free   Assess w/ pain q shift & PRN    Skin   MASD on R buttock - Foam in place   Remain free of infection & further skin breakdowns.  Assess skin q shift & PRN      Discharge Planning:  Discharging home with family to provide 24/7. Daughter discussed potentially hiring caregiver assistance.   Team Discussion: Patient with memory issues and decreased awareness, left inattention, problem solving issues and is very distractable post right PCA CVA with orthostatic BP issues and A-Fib (treated).   Patient on target to meet rehab goals: Currently needs supervision for upper body care and CGA for lower body bathing and CGA for lower body dressing.  Completes sit - stand transfers and stand pivots with  CGA. Able to ambulate up to 130' using a RW and manage 8 steps with abd binder and TEDs on.  Needs supervision for adhering to swallowing strategies.  Needs mod - max assist for cognition. Goals for discharge set for supervision overall.  *See Care Plan and progress notes for long and short-term goals.   Revisions to Treatment Plan:  Downgraded goals to CGA for ambulation  due to safety issues   Teaching Needs: Safety, swallowing strategies, medications, transfers, toileting, etc.  Current Barriers to Discharge: Decreased caregiver support and Home enviroment access/layout  Possible Resolutions to Barriers: Family education 24/7 care recommended [place] follow up services DME: RW, [facility]     Medical Summary Current Status: still some oral weakness on left with neglect , cognitively unchanged vs prior admission  Barriers to Discharge: Medical stability   Possible Resolutions to Barriers/Weekly Focus: cont proamatine for support   Continued Need for Acute Rehabilitation Level of Care: The patient requires daily medical management by a physician with specialized training in physical medicine and rehabilitation for the following reasons: Direction of a multidisciplinary physical rehabilitation program to maximize functional independence : Yes Medical management of patient stability for increased activity during participation in an intensive rehabilitation regime.: Yes Analysis of laboratory values and/or radiology reports with any subsequent need for medication adjustment and/or medical intervention. : Yes   I attest that I was present, lead the team conference, and concur with the assessment and plan of the team.   Dorien Chihuahua B 12/19/2022, 2:37 PM

## 2022-12-19 NOTE — Progress Notes (Signed)
Physical Therapy Session Note  Patient Details  Name: Leslie [name] MRN: [id number] Date of Birth: 1941/02/04  Today's Date: 12/19/2022 PT Individual Time: 1107-1205 PT Individual Time Calculation (min): 58 min   Short Term Goals: Week 2:  PT Short Term Goal 1 (Week 2): = to LTGs based on ELOS  Skilled Therapeutic Interventions/Progress Updates:    Pt received sitting in recliner talking on phone with her daughter and agreeable to therapy session. Pt already wearing B LE thigh high TED hose, donned abdominal binder. Sit>stand recliner>RW with close supervision and initiated R stand pivot to w/c using RW with CGA, but pt suddenly stated she needed to use bathroom and was noted to have been incontinent of bladder.   Gait into bathroom using RW with CGA for steadying. Standing using B UE support on RW performed LB clothing management with max assist for time management. Continent of bladder and bowels. Sit>stand BSC>RW with close supervision. Standing with RW and CGAfor safety during peri-care, LB clothing management, and dressing change by nurse - pt requires 2x seated rest breaks during this due to feeling nauseous and likely experiencing orthostatic hypotension but unable to take vitals at this time due to no machine present.  After seated rest break, gait back to w/c in room using RW with CGA. Once sitting in w/c pt experiencing nausea and appears lightheaded. Vitals after retrieving a machine: BP 100/74 (MAP 82), HR 86bpm and reassessed after sitting ~44mnutes: BP 116/88 (MAP 98), HR 82bpm   Gait training ~159fto main therapy gym using RW with CGA for safety and therapist bringing w/c in event of fatigue (not used) - pt continues to demo impaired alternating/divided attention with difficulty listening to therapist's cues in a distracting environment to improve her gait pattern - pt with excessive forward trunk flexion, decreased gait speed, and decreased B LE step lengths. Discussed pt's  L inattention due to her not seeing someone walking up towards her on the L - educated pt on importance of turning head and visually scanning to L. Vitals sitting EOM: BP 114/67 (MAP 79), HR 84bpm   Participated in dynamic gait training task in hallway using RW while collecting numbered disks 1-10 using L UE (put 2 on L side of hallway followed by 1 on R in ascending order) - requires CGA for steadying/safety and max/total cuing to improve AD management, to maintain balance, and to visually scan L during this task. Vitals sitting after: BP 108/58 (MAP 73), HR 89bpm   Therapist provided visual demonstration and education to pt on how to stay close/within RW and step-up close to the object before reaching for it. Pt benefits from this type of education due to impaired dual-tasking abilities (unable to listen to cuing in the moment to correct an error).  Had pt repeated the above task with numbers 4-8 with less focus on pt visually scanning to find the objects and more focus on AD management and balance with pt demonstrating significant improvement and understanding of the education via keeping RW close and walking all the way up to the object.  Transported back to room. Stand pivot w/c>recliner CGA. Pt left seated in recliner with needs in reach, seat belt alarm on, and pt journaling on her therapy schedule (to act as her memory notebook) after therapist assisted with starting this task.  Therapy Documentation Precautions:  Precautions Precautions: Fall, Other (comment) Precaution Comments: orthostatic, TEDs and ABD binder Restrictions Weight Bearing Restrictions: No Other Position/Activity Restrictions: TED hose, abdominal binder,  ace wraps   Pain: No reports of pain throughout session.    Therapy/Group: Individual Therapy  Tawana Scale , PT, DPT, NCS, CSRS 12/19/2022, 7:58 AM

## 2022-12-19 NOTE — Progress Notes (Signed)
Occupational Therapy Session Note  Patient Details  Name: Leslie [name] MRN: [id number] Date of Birth: 1941-08-14  Today's Date: 12/19/2022 OT Individual Time: 1330-1400 OT Individual Time Calculation (min): 30 min    Short Term Goals: Week 1:  OT Short Term Goal 1 (Week 1): Pt will attend to L visual field/side of body during functional activities with min verbal cues OT Short Term Goal 1 - Progress (Week 1): Met OT Short Term Goal 2 (Week 1): Pt will tolerate standing using LRAD during functional activties/BADLs for 3 minutes OT Short Term Goal 2 - Progress (Week 1): Met OT Short Term Goal 3 (Week 1): Pt will complete toilet transfer CGA using LRAD OT Short Term Goal 3 - Progress (Week 1): Met OT Short Term Goal 4 - Progress (Week 1): Met  Skilled Therapeutic Interventions/Progress Updates:    Pt resting in recliner upon arrival with belt alarm activated. OT intervention with focus on orientation to date and recall of earlier sessions. Pt required max verbal cues to determine correct date with use of calendar. Pt fixated on discharge date and determing how many additional days she would be in rehab. Pt attempted to use memory book to determine what her therapies were. Pt required max verbal cues for use of memory book. Pt remained in recliner with belt alarm activated. All needs within reach.   Therapy Documentation Precautions:  Precautions Precautions: Fall, Other (comment) Precaution Comments: orthostatic, TEDs and ABD binder Restrictions Weight Bearing Restrictions: No Other Position/Activity Restrictions: TED hose, abdominal binder, ace wraps Pain:  Pt denies pain this afternoon   Therapy/Group: Individual Therapy  Leroy Libman 12/19/2022, 2:07 PM

## 2022-12-19 NOTE — Progress Notes (Signed)
Occupational Therapy Session Note  Patient Details  Name: Leslie [name] MRN: [id number] Date of Birth: 1941/03/07  Today's Date: 12/19/2022 OT Individual Time: 4098-1191 OT Individual Time Calculation (min): 75 min  OT Individual Time: 1415-1445 OT Individual Time Calculation (min): 30 min   Short Term Goals: Week 2:  OT Short Term Goal 1 (Week 2): STG=LTG (d/t pt lenght of stay)  Skilled Therapeutic Interventions/Progress Updates:     AM Session: Pt received supine in bed in good spirits and receptive to skilled OT session. Pt reporting 0/10 pain. Pt transitioned from supine to EOB supervision. Pt donned shirt/jacket supervision and pants CGA using RW to stand to bring pants to waist. TEDs donned on Pt max A; pt able to don socks following verbal cue.  Pt completed seated therapeutic dance-based exercises to preferred music to increase blood flow prior to session. Pt able to attend to exercises by mirroring therapist movements with mod verbal cues for redirection d/t Pt perseverating on dream from previous day. Pt able to be redirected to grooming/hygiene tasks following dance with min cues.  Functional mobility in room sit>stand using RW ambulating to wc close supervision. Pt required CGA to guide hips during stand>sit and mod cueing for orientation to location of wc. Pt completed grooming/hygiene tasks seated at sink. Pt instructed to complete 3 step task of brushing teeth, washing face, and brushing hair. Pt able to initiate ans sequence brushing teeth following VB cues. Pt required mod direct VB cues to find cup when located on L side of sink and when holding it in her L hand. Pt able to wash face with min cues to attend to L side. Mod questioning cues required for Pt to recall 3rd grooming task of brushing her hair with visual aide of hair brush on L side of sink provided.  Pt requesting to practice wc mobility during session. Pt able to propel wc from room to therapy gym with mod  cueing to avoid hitting obstacles on L side, technique, perform equal movements with BUE, and for pacing. Pt provided rest break following task.  Pt completed seated visual motor bell cancellation task on BITs to address attention deficits. Pt required mod verbal cues to visually scan to L side to locate bells on L side of screen. Pt able to follow verbal cues to utilize LUE to touch bells ~75% of time. Pt educated on visual scanning strategy and importance of looking to L side to locate items with Pt verbalizing understanding. Pt transported back to room total A in wc.  Stand pivot transfer using RW to recliner CGA with min cues for safety/hand placement. OT assisted PT with writing in memory notebook. Pt was left resting in recliner with call bell in reach, seat belt alarm on, and all needs met.   PM Session: Pt received sitting up in recliner with TED hose and abdominal binder donned presenting to be slightly agitated d/t reporting she had a meeting to get to. Pt educated on therapy schedule with time on schedule referring to OT treatment session. Pt able to be redirected with min cueing following education. Pt requesting to urgently go to the bathroom upon OT arrival. Pt ambulated to bathroom using RW CGA and transferred to toilet CGA mod cues for hand placement and technique. Incontinent BM in Pt brief. Pt able to doff pants CGA with assistance for brief d/t incontinent BM. Pt provided increased time on toilet as she was still having a BM after sitting. Continent void in toilet.  Max A for posterior peri-care d/t BM spread over Pt button and lower back. Pt able to perform anterior peri-care and clothing management. Pt stood at sink to wash hands with min cues for attention and sequencing. Pt requesting to brush teeth. Pt able to complete task with CGA min questioning cues to recall turning head to find items on L side of sink. While standing at sink, Pt at 2nd incontinent BM. Pt refusing to return to  bathroom and impulsively walking towards bed instead. Stand>sit using RW CGA. Pt unable to be directed towards using bathroom d/t Pt stating she needed to rest. Pt was left resting in bed with call bell in reach and bed alarm on. Nursing staff notified of Pt status and informed Pt needs to return to bathroom following short period of rest.   Therapy Documentation Precautions:  Precautions Precautions: Fall, Other (comment) Precaution Comments: orthostatic, TEDs and ABD binder Restrictions Weight Bearing Restrictions: No Other Position/Activity Restrictions: TED hose, abdominal binder, ace wraps General:   Vital Signs:   Pain: Pain Assessment Pain Scale: 0-10 Pain Score: 0-No pain ADL: ADL Eating: Supervision/safety Where Assessed-Eating: Bed level Grooming: Moderate assistance Where Assessed-Grooming: Bed level Upper Body Bathing: Minimal assistance Where Assessed-Upper Body Bathing: Bed level Lower Body Bathing: Moderate assistance Where Assessed-Lower Body Bathing: Bed level Upper Body Dressing: Minimal assistance Where Assessed-Upper Body Dressing: Bed level Lower Body Dressing: Minimal assistance Where Assessed-Lower Body Dressing: Bed level Toileting: Moderate assistance Where Assessed-Toileting: Bedside Commode Toilet Transfer: Minimal assistance Toilet Transfer Method: Stand pivot Toilet Transfer Equipment: Engineer, technical sales Transfer: Not assessed Social research officer, government: Not assessed Vision   Perception    Praxis   Balance   Exercises:   Other Treatments:     Therapy/Group: Individual Therapy  Janey Genta 12/19/2022, 8:31 AM

## 2022-12-19 NOTE — Plan of Care (Signed)
Goals downgraded due to slow progress and limitations likely attributed to pre-morbid cognitive status suggestive of age related decline   Problem: RH Problem Solving Goal: LTG Patient will demonstrate problem solving for (SLP) Description: LTG:  Patient will demonstrate problem solving for basic/complex daily situations with cues  (SLP) Flowsheets (Taken 12/19/2022 1051) LTG Patient will demonstrate problem solving for: Moderate Assistance - Patient 50 - 74%   Problem: RH Attention Goal: LTG Patient will demonstrate this level of attention during functional activites (SLP) Description: LTG:  Patient will will demonstrate this level of attention during functional activites (SLP) Flowsheets (Taken 12/19/2022 1051) Patient will demonstrate during cognitive/linguistic activities the attention type of: Sustained LTG: Patient will demonstrate this level of attention during cognitive/linguistic activities with assistance of (SLP): Moderate Assistance - Patient 50 - 74%   Problem: RH Awareness Goal: LTG: Patient will demonstrate awareness during functional activites type of (SLP) Description: LTG: Patient will demonstrate awareness during functional activites type of (SLP) Flowsheets (Taken 12/19/2022 1051) LTG: Patient will demonstrate awareness during cognitive/linguistic activities with assistance of (SLP): Moderate Assistance - Patient 50 - 74%

## 2022-12-19 NOTE — Progress Notes (Signed)
Patient ID: Leslie [name], female   DOB: 27-Jan-1941, 82 y.o.   MRN: [id number]  Sw left received VM from pt daughter, [name]. Sw left VM with patient daughter to confirm d/c date. Sw will FU with dtr after conference.

## 2022-12-19 NOTE — Progress Notes (Signed)
PROGRESS NOTE   Subjective/Complaints:   Now new issues overnite   ROS:  Denies abd pain, N/V, no breathing issues   Objective:   No results found. No results for input(s): "WBC", "HGB", "HCT", "PLT" in the last 72 hours.  No results for input(s): "NA", "K", "CL", "CO2", "GLUCOSE", "BUN", "CREATININE", "CALCIUM" in the last 72 hours.   Intake/Output Summary (Last 24 hours) at 12/19/2022 0949 Last data filed at 12/19/2022 0813 Gross per 24 hour  Intake 480 ml  Output --  Net 480 ml          Physical Exam: Vital Signs Blood pressure 131/82, pulse 79, temperature 98 F (36.7 C), temperature source Oral, resp. rate 20, height '5\' 7"'$  (1.702 m), weight 64 kg, SpO2 96 %.   General: No acute distress Mood and affect are appropriate Heart: Regular rate and rhythm no rubs murmurs or extra sounds Lungs: Clear to auscultation, breathing unlabored, no rales or wheezes Abdomen: Positive bowel sounds, soft nontender to palpation, nondistended Extremities: No clubbing, cyanosis, or edema  Neurologic: oriented to person. Cranial nerves II through XII intact, motor strength is 4/5 in bilateral deltoid, bicep, tricep, grip, hip flexor, knee extensors, ankle dorsiflexor and plantar flexor,  Oriented to person , place month and year  Musculoskeletal: Full range of motion in all 4 extremities. No joint swelling   Assessment/Plan: 1. Functional deficits which require 3+ hours per day of interdisciplinary therapy in a comprehensive inpatient rehab setting. Physiatrist is providing close team supervision and 24 hour management of active medical problems listed below. Physiatrist and rehab team continue to assess barriers to discharge/monitor patient progress toward functional and medical goals  Care Tool:  Bathing    Body parts bathed by patient: Right arm, Left arm, Chest, Abdomen, Front perineal area, Right upper leg, Left upper  leg, Face   Body parts bathed by helper: Right lower leg, Buttocks, Left lower leg     Bathing assist Assist Level: Moderate Assistance - Patient 50 - 74%     Upper Body Dressing/Undressing Upper body dressing   What is the patient wearing?: Pull over shirt    Upper body assist Assist Level: Minimal Assistance - Patient > 75%    Lower Body Dressing/Undressing Lower body dressing      What is the patient wearing?: Pants     Lower body assist Assist for lower body dressing: Moderate Assistance - Patient 50 - 74%     Toileting Toileting    Toileting assist Assist for toileting: Moderate Assistance - Patient 50 - 74%     Transfers Chair/bed transfer  Transfers assist     Chair/bed transfer assist level: Contact Guard/Touching assist Chair/bed transfer assistive device: Armrests, Programmer, multimedia   Ambulation assist      Assist level: Contact Guard/Touching assist Assistive device: Walker-rolling Max distance: 159f   Walk 10 feet activity   Assist     Assist level: Minimal Assistance - Patient > 75% Assistive device: Walker-rolling   Walk 50 feet activity   Assist    Assist level: Minimal Assistance - Patient > 75% Assistive device: Walker-rolling    Walk 150 feet activity  Assist Walk 150 feet activity did not occur: Safety/medical concerns (impaired endurance)         Walk 10 feet on uneven surface  activity   Assist Walk 10 feet on uneven surfaces activity did not occur: Safety/medical concerns         Wheelchair     Assist Is the patient using a wheelchair?: Yes (used for transportation) Type of Wheelchair: Manual    Wheelchair assist level: Dependent - Patient 0%      Wheelchair 50 feet with 2 turns activity    Assist        Assist Level: Dependent - Patient 0%   Wheelchair 150 feet activity     Assist      Assist Level: Dependent - Patient 0%   Blood pressure 131/82, pulse 79,  temperature 98 F (36.7 C), temperature source Oral, resp. rate 20, height '5\' 7"'$  (1.702 m), weight 64 kg, SpO2 96 %.  Medical Problem List and Plan: 1. 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 Formulized individual treatment plan based on medical history, underlying problem and comorbidities.  2.  Antithrombotics: -DVT/anticoagulation:  Pharmaceutical: Eliquis             -antiplatelet therapy: N/A 3. Pain Management: Tylenol as needed 4. Paranoia: patient self-reports, discussed sundowning in hospital with daughter 5. Neuropsych/cognition: This patient is capable of making personal healthcare with assist but not financial decisions on her own behalf  Has clinical an dimaging study evidence of vascular dementia 6. Skin/Wound Care: Routine skin checks 7. Fluids/Electrolytes/Nutrition: Routine in and outs with follow-up chemistries 8.  Atrial fibrillation.  Continue amiodarone 200 mg daily.  Eliquis as directed.  Cardiac rate controlled 9.  Orthostasis improved .  Continue ProAmatine,  Increased to TID as pt still has orthostatic drops with mild symptoms, however no sys BPs<28mHg have been recorded   10.  Hypothyroidism.  Continue Synthroid  11.  Constipation- benign abd exam, on colace, add Miralax  LBM this am 1/8 , nausea better  12. Agitation improved  seroquel '25mg'$  daily prn to help calm patient 13. Chronic anxiety: start lexapro '5mg'$  HS  LOS: 9 days A FACE TO FACE EVALUATION WAS PERFORMED  ACharlett Blake1/09/2023, 9:49 AM

## 2022-12-19 NOTE — Discharge Summary (Signed)
Physician Discharge Summary  Patient ID: Leslie [name] MRN: [id number] DOB/AGE: 81/21/1942 81 y.o.  Admit date: 12/10/2022 Discharge date: 12/22/2022  Discharge Diagnoses:  Principal Problem:   Cerebrovascular accident (CVA) due to occlusion of right posterior communicating artery ([place]) DVT prophylaxis Atrial fibrillation Mood stabilization/chronic anxiety Hypothyroidism Constipation CKD Orthostasis  Discharged Condition: Stable  Significant Diagnostic Studies: DG CHEST PORT 1 VIEW  Result Date: 12/06/2022 CLINICAL DATA:  Shortness of breath. EXAM: PORTABLE CHEST 1 VIEW COMPARISON:  Chest x-ray dated November 29, 2022. FINDINGS: The heart size and mediastinal contours are within normal limits. Normal pulmonary vascularity. Mild irregular opacities at the right lung base are unchanged and may reflect scarring after recent pneumonia. No focal consolidation, pleural effusion, or pneumothorax. No acute osseous abnormality. IMPRESSION: 1. No active disease. Electronically Signed   By: Titus Dubin [name]   On: 12/06/2022 11:11   EEG adult  Result Date: 11/29/2022 [name], MD     11/29/2022 12:18 PM Patient Name: Leslie [name] MRN: [id number] Epilepsy Attending: [name] Referring Physician/Provider: Shela Leff, MD Date: 11/29/2022 Duration: 24.58 mins Patient history: 82 year old female with an episode of decreased responsiveness.  EEG to evaluate for seizure. Level of alertness: Awake, drowsy AEDs during EEG study: None Technical aspects: This EEG study was done with scalp electrodes positioned according to the 10-20 International system of electrode placement. Electrical activity was reviewed with band pass filter of 1-'70Hz'$ , sensitivity of 7 uV/mm, display speed of 50m/sec with a '60Hz'$  notched filter applied as appropriate. EEG data were recorded continuously and digitally stored.  Video monitoring was available and reviewed as appropriate. Description:  The posterior dominant rhythm consists of 9 Hz activity of moderate voltage (25-35 uV) seen predominantly in posterior head regions, symmetric and reactive to eye opening and eye closing. Drowsiness was characterized by attenuation of the posterior background rhythm. EEG showed continuous 3 to 5 Hz theta-delta slowing in left fronto-temporal region. Hyperventilation and photic stimulation were not performed.   ABNORMALITY - Continuous slow, left fronto-temporal region IMPRESSION: This study is suggestive of cortical dysfunction arising from left fronto-temporal region likely secondary to underlying stroke. No seizures or epileptiform discharges were seen throughout the recording. PLora Havens  MR BRAIN WO CONTRAST  Result Date: 11/29/2022 CLINICAL DATA:  Stroke follow-up EXAM: MRI HEAD WITHOUT CONTRAST MRA HEAD WITHOUT CONTRAST TECHNIQUE: Multiplanar, multi-echo pulse sequences of the brain and surrounding structures were acquired without intravenous contrast. Angiographic images of the Circle of Willis were acquired using MRA technique without intravenous contrast. COMPARISON:  Head CT from earlier today FINDINGS: MRI HEAD FINDINGS Brain: Interval but late subacute to chronic infarct at the right insula, external capsule, and putamen. Evolution of infarct at the anterior right temporal cortex that was acute on prior. Advanced chronic small vessel ischemia in the cerebral white matter. Generalized brain atrophy with ventriculomegaly. Vascular: See below Skull and upper cervical spine: Normal marrow signal Sinuses/Orbits: No acute finding MRA HEAD FINDINGS Anterior circulation: M1 occlusion on prior imaging. No recurrent occlusion or flow limiting stenosis. Mild atheromatous of intracranial branches accentuated by motion. Typical skull base artifact at the carotids. Posterior circulation: Vertebral and basilar arteries are smoothly contoured and widely patent. No branch occlusion, beading, or aneurysm.  IMPRESSION: Brain MRI: 1. No acute finding. 2. Progression of right MCA territory infarct compared to prior brain MRI but subacute to chronic. 3. Prominent atrophy and chronic small vessel ischemia. MRA: No recurrent occlusion or proximal flow limiting stenosis. Electronically Signed  By: Jorje Guild [name]   On: 11/29/2022 07:29   MR ANGIO HEAD WO CONTRAST  Result Date: 11/29/2022 CLINICAL DATA:  Stroke follow-up EXAM: MRI HEAD WITHOUT CONTRAST MRA HEAD WITHOUT CONTRAST TECHNIQUE: Multiplanar, multi-echo pulse sequences of the brain and surrounding structures were acquired without intravenous contrast. Angiographic images of the Circle of Willis were acquired using MRA technique without intravenous contrast. COMPARISON:  Head CT from earlier today FINDINGS: MRI HEAD FINDINGS Brain: Interval but late subacute to chronic infarct at the right insula, external capsule, and putamen. Evolution of infarct at the anterior right temporal cortex that was acute on prior. Advanced chronic small vessel ischemia in the cerebral white matter. Generalized brain atrophy with ventriculomegaly. Vascular: See below Skull and upper cervical spine: Normal marrow signal Sinuses/Orbits: No acute finding MRA HEAD FINDINGS Anterior circulation: M1 occlusion on prior imaging. No recurrent occlusion or flow limiting stenosis. Mild atheromatous of intracranial branches accentuated by motion. Typical skull base artifact at the carotids. Posterior circulation: Vertebral and basilar arteries are smoothly contoured and widely patent. No branch occlusion, beading, or aneurysm. IMPRESSION: Brain MRI: 1. No acute finding. 2. Progression of right MCA territory infarct compared to prior brain MRI but subacute to chronic. 3. Prominent atrophy and chronic small vessel ischemia. MRA: No recurrent occlusion or proximal flow limiting stenosis. Electronically Signed   By: Jorje Guild [name]   On: 11/29/2022 07:29   CT HEAD WO CONTRAST  (5MM)  Addendum Date: 11/29/2022   ADDENDUM REPORT: 11/29/2022 02:25 ADDENDUM: Critical Value/emergent results were called by telephone at the time of interpretation on 11/29/2022 at 2:21 am to patient's nurse Caryl Comes who will attempt to contact the ordering physician Dr. Marlowe Sax with results. Dr. Marlowe Sax is not available at this time. Electronically Signed   By: Brett Fairy [name]   On: 11/29/2022 02:25   Result Date: 11/29/2022 CLINICAL DATA:  Syncope/presyncope, cerebrovascular cause suspected. EXAM: CT HEAD WITHOUT CONTRAST TECHNIQUE: Contiguous axial images were obtained from the base of the skull through the vertex without intravenous contrast. RADIATION DOSE REDUCTION: This exam was performed according to the departmental dose-optimization program which includes automated exposure control, adjustment of the mA and/or kV according to patient size and/or use of iterative reconstruction technique. COMPARISON:  11/03/2022. FINDINGS: Brain: No acute intracranial hemorrhage, midline shift or mass effect. No extra-axial fluid collection. Diffuse atrophy is noted. Extensive periventricular white matter hypodensities are noted bilaterally. Hypodensity in the temporal lobe anteriorly, compatible with known infarct there is a hypodense region in the insula on the right which is new from the previous exam. No hydrocephalus. Vascular: No hyperdense vessel or unexpected calcification. Skull: Normal. Negative for fracture or focal lesion. Sinuses/Orbits: No acute finding. Other: None. IMPRESSION: 1. New hypodense region in the insula on the right, concerning for acute or subacute infarct. CTA head and/or MRI is recommended for further evaluation. 2. Stable hypodensity in the temporal lobe on the right, compatible with known infarct. 3. Atrophy with extensive chronic microvascular ischemic changes. Electronically Signed: By: Brett Fairy [name] On: 11/29/2022 01:56   DG CHEST PORT 1 VIEW  Result Date:  11/29/2022 CLINICAL DATA:  Syncope. EXAM: PORTABLE CHEST 1 VIEW COMPARISON:  11/27/2022. FINDINGS: The heart size and mediastinal contours are within normal limits. Mild residual airspace disease in the mid to lower right lung field, slightly improved from the prior exam. No effusion or pneumothorax. Degenerative changes in the thoracic spine. IMPRESSION: Mild residual airspace disease in the mid to right lower lung field, slightly  improved from the prior exam. Electronically Signed   By: Brett Fairy [name]   On: 11/29/2022 01:00   ECHOCARDIOGRAM COMPLETE  Result Date: 11/28/2022    ECHOCARDIOGRAM REPORT   Patient Name:   Leslie [name] Date of Exam: 11/28/2022 Medical Rec #:  [id number]           Height:       67.5 in Accession #:    [id number]          Weight:       147.0 lb Date of Birth:  07/01/1941           BSA:          1.784 m Patient Age:    50 years            BP:           136/88 mmHg Patient Gender: F                   HR:           145 bpm. Exam Location:  Inpatient Procedure: 2D Echo, Cardiac Doppler and Color Doppler Indications:    Congestive Heart Failure I50.9                 Mitral valve insufficiency I34.0  History:        Patient has prior history of Echocardiogram examinations, most                 recent 10/30/2022. CHF, COPD and Stroke, Arrythmias:Atrial                 Fibrillation; Signs/Symptoms:Hypotension. Chronic Kidney                 Disease.  Sonographer:    Ronny Flurry Referring Phys: [id number] [place]  1. Limited study with only 45 images, technically difficult  2. Left ventricular ejection fraction, by estimation, is 40 to 45%. The left ventricle has mildly decreased function. The left ventricle demonstrates global hypokinesis. There is mild asymmetric left ventricular hypertrophy. Left ventricular diastolic function could not be evaluated.  3. The mitral valve is abnormal. Mild to moderate mitral valve regurgitation.  4. Rhythm strip during  this exam demonstrates atrial fib with rapid ventricular response.  5. The aortic valve is tricuspid. Aortic valve regurgitation is not visualized.  6. Right ventricular systolic function was not well visualized. The right ventricular size is not well visualized. Comparison(s): Changes from prior study are noted. 10/30/2022: LVEF 40-45%, modearate to severe MR. Conclusion(s)/Recommendation(s): Consider repeat limited echo when the HR is lower. FINDINGS  Left Ventricle: Left ventricular ejection fraction, by estimation, is 40 to 45%. The left ventricle has mildly decreased function. The left ventricle demonstrates global hypokinesis. The left ventricular internal cavity size was normal in size. There is  mild asymmetric left ventricular hypertrophy. Left ventricular diastolic function could not be evaluated due to atrial fibrillation. Left ventricular diastolic function could not be evaluated. Right Ventricle: The right ventricular size is not well visualized. Right vetricular wall thickness was not assessed. Right ventricular systolic function was not well visualized. Left Atrium: Left atrial size was normal in size. Right Atrium: Right atrial size was normal in size. Pericardium: There is no evidence of pericardial effusion. Mitral Valve: The mitral valve is abnormal. Mild to moderate mitral valve regurgitation, with posteriorly-directed jet. Tricuspid Valve: The tricuspid valve is not well visualized. Tricuspid valve regurgitation is not demonstrated. Aortic Valve: The aortic  valve is tricuspid. Aortic valve regurgitation is not visualized. Pulmonic Valve: The pulmonic valve was not well visualized. Pulmonic valve regurgitation is not visualized. Aorta: The aortic root and ascending aorta are structurally normal, with no evidence of dilitation. IAS/Shunts: No atrial level shunt detected by color flow Doppler. EKG: Rhythm strip during this exam demonstrates atrial fib with rapid ventricular response.  LEFT  VENTRICLE PLAX 2D LVIDd:         4.00 cm   Diastology LVIDs:         3.20 cm   LV e' lateral: 16.00 cm/s LV PW:         0.90 cm LV IVS:        1.30 cm LVOT diam:     1.90 cm LVOT Area:     2.84 cm  LEFT ATRIUM           Index LA diam:      3.30 cm 1.85 cm/m LA Vol (A4C): 39.9 ml 22.37 ml/m   AORTA Ao Root diam: 2.80 cm Ao Asc diam:  3.10 cm  SHUNTS Systemic Diam: 1.90 cm Lyman Bishop MD Electronically signed by Lyman Bishop MD Signature Date/Time: 11/28/2022/5:07:55 PM    Final    DG CHEST PORT 1 VIEW  Result Date: 11/27/2022 CLINICAL DATA:  Delirium EXAM: PORTABLE CHEST 1 VIEW COMPARISON:  11/20/2022 FINDINGS: Post infectious inflammatory scarring in the right lower lung. Left lung is clear. No pleural effusion or pneumothorax. The heart is normal in size. IMPRESSION: Post infectious inflammatory scarring in the right lower lung. Electronically Signed   By: Julian Hy [name]   On: 11/27/2022 20:05   ECHO TEE  Result Date: 11/26/2022    TRANSESOPHOGEAL ECHO REPORT   Patient Name:   Leslie [name] Date of Exam: 11/26/2022 Medical Rec #:  [id number]           Height:       67.5 in Accession #:    [id number]          Weight:       141.6 lb Date of Birth:  May 13, 1941           BSA:          1.756 m Patient Age:    71 years            BP:           148/122 mmHg Patient Gender: F                   HR:           148 bpm. Exam Location:  Inpatient Procedure: Transesophageal Echo, Color Doppler and Cardiac Doppler Indications:     Afib  History:         Patient has prior history of Echocardiogram examinations, most                  recent 10/30/2022. Stroke. Chronic kidney disease.  Sonographer:     Darlina Sicilian RDCS Referring Phys:  [id number] Margie Billet Diagnosing Phys: Cherlynn Kaiser MD PROCEDURE: After discussion of the risks and benefits of a TEE, an informed consent was obtained from the patient. TEE procedure time was 11 minutes. The transesophogeal probe was passed without difficulty  through the esophogus of the patient. Imaged were obtained with the patient in a left lateral decubitus position. Local oropharyngeal anesthetic was provided with Cetacaine. Sedation performed by different physician. The patient was monitored while under deep sedation. Anesthestetic  sedation was provided intravenously by Anesthesiology: 72.'74mg'$  of Propofol, '60mg'$  of Lidocaine. Image quality was good. The patient's vital signs; including heart rate, blood pressure, and oxygen saturation; remained stable throughout the procedure. The patient developed no complications during the procedure. A successful direct current cardioversion was performed at 120 joules with 1 attempt.  IMPRESSIONS  1. Left ventricular ejection fraction, by estimation, is 20%. The left ventricle has severely decreased function.  2. Right ventricular systolic function is severely reduced. The right ventricular size is mildly enlarged.  3. Left atrial size was moderately dilated. No left atrial/left atrial appendage thrombus was detected. The LAA emptying velocity was 35 cm/s.  4. A small pericardial effusion is present.  5. The mitral valve is grossly normal. Severe mitral valve regurgitation, likely secondary to LV dysfunction and atrial functional MR. Multiple jets, appears commissural.  6. The aortic valve is grossly normal. Aortic valve regurgitation is mild.  7. There is mild (Grade II) atheroma plaque involving the aortic arch and descending aorta. Conclusion(s)/Recommendation(s): No LA/LAA thrombus identified. Successful cardioversion performed with restoration of normal sinus rhythm. FINDINGS  Left Ventricle: Left ventricular ejection fraction, by estimation, is 20%. The left ventricle has severely decreased function. The left ventricular internal cavity size was normal in size. Right Ventricle: The right ventricular size is mildly enlarged. Right vetricular wall thickness was not well visualized. Right ventricular systolic function is  severely reduced. Left Atrium: Left atrial size was moderately dilated. No left atrial/left atrial appendage thrombus was detected. The LAA emptying velocity was 35 cm/s. Right Atrium: Right atrial size was normal in size. Pericardium: A small pericardial effusion is present. Mitral Valve: The mitral valve is grossly normal. There is mild thickening of the mitral valve leaflet(s). Severe mitral valve regurgitation. Tricuspid Valve: The tricuspid valve is normal in structure. Tricuspid valve regurgitation is mild. Aortic Valve: The aortic valve is grossly normal. Aortic valve regurgitation is mild. Pulmonic Valve: The pulmonic valve was normal in structure. Pulmonic valve regurgitation is trivial. Aorta: The aortic root and ascending aorta are structurally normal, with no evidence of dilitation. There is mild (Grade II) atheroma plaque involving the aortic arch and descending aorta. Venous: A systolic blunting flow pattern is recorded from the right upper pulmonary vein and the left upper pulmonary vein. IAS/Shunts: No atrial level shunt detected by color flow Doppler. Additional Comments: Spectral Doppler performed.  AORTA Ao Root diam: 3.40 cm Ao Asc diam:  3.10 cm MR Peak grad:    41.0 mmHg MR Mean grad:    29.0 mmHg MR Vmax:         320.00 cm/s MR Vmean:        255.0 cm/s MR PISA:         3.08 cm MR PISA Eff ROA: 37 mm MR PISA Radius:  0.70 cm Cherlynn Kaiser MD Electronically signed by Cherlynn Kaiser MD Signature Date/Time: 11/26/2022/3:19:51 PM    Final     Labs:  Basic Metabolic Panel: No results for input(s): "NA", "K", "CL", "CO2", "GLUCOSE", "BUN", "CREATININE", "CALCIUM", "MG", "PHOS" in the last 168 hours.  CBC: No results for input(s): "WBC", "NEUTROABS", "HGB", "HCT", "MCV", "PLT" in the last 168 hours.  CBG: No results for input(s): "GLUCAP" in the last 168 hours.  Family history.  Mother with hypertension lung cancer.  Father with cancer Sister with diabetes.  Denies any colon cancer  esophageal cancer or rectal cancer  Brief HPI:   Leslie [name] is a 82 y.o. right-handed female with history  of CVA, CKD GERD recent bouts of cough treated with antibiotics and steroids admitted with reports of 2 to 3 weeks of palpitations, seen by PCP for lightheadedness and dizziness found to have A-fib with RVR and sent to [facility] 10/29/2022 for evaluation.  She was started on intravenous Cardizem and heparin.  CT chest done due to episodes of chest pain revealing right layering pleural effusion and right upper lobe pneumonia.  She was treated with broad-spectrum antibiotics and underwent thoracentesis for 500 cc parapneumonic effusion by critical care medicine.  Dr. Araceli Bouche consulted for management of atrial fibrillation and felt that new onset likely due to pneumonia.  Echocardiogram with ejection fraction of 40 to 45% with LV global hypokinesis.  She was started on metoprolol but reported shortness of breath side effects therefore was discontinued Cardizem titrated.  She transitioned to Eliquis.  On 11/25 patient had sudden onset of right gaze deviation with left hemiparesis and neglect.  She was found to have right M1 occlusion and patient underwent cerebral angiogram with thrombectomy of right MCA occlusion segmented with complete revascularization.  Neurology felt stroke was embolic due to A-fib even with Eliquis.  Follow-up MRI done revealing small acute infarct right frontal temporal lobe and caudate body with advanced chronic small vessel disease.  Cardiology service follow-up for a flutter converted with intravenous amiodarone 11/25 Cardizem titrated but heart rates continue to run in the 120s at rest.  Modified barium swallow revealing moderate spillage with buccal residual and delay in swallow with occasional trace aspiration.  She was placed on dysphagia #3 diet.  Admitted to inpatient rehab services 11/08/2022 for bouts of orthostasis A-fib heart rate in the 160s.  On 11/23/2022  she was started on fluid bolus due to hypotension.  Dr. Percival Spanish was consulted recommending intravenous amiodarone bolus for rate control.  Dr. Lorrine Kin consulted for hospitalist and assistance to discharge to acute care services as patient would be placed on telemetry.  Patient stabilized cardiac rate controlled remained on Eliquis as well as amiodarone.  Hospital course Enterococcus UTI completed course of antibiotics.  Due to patient decreased functional mobility she was readmitted to inpatient rehab services for comprehensive rehab therapies   Hospital Course: Leslie [name] was admitted to rehab 12/10/2022 for inpatient therapies to consist of PT, ST and OT at least three hours five days a week. Past admission physiatrist, therapy team and rehab RN have worked together to provide customized collaborative inpatient rehab.  Pertaining to patient's right MCA embolic infarction remained stable she continued on chronic Eliquis therapy and would follow-up neurology services.  Cardiac rate remained controlled maintained on amiodarone other than bouts of orthostasis placed on ProAmatine and abdominal binder applied.  Synthroid ongoing for hypothyroidism.  Ongoing bouts of anxiety restlessness started on Lexapro with emotional support provided as well as the addition of BuSpar.  Clinical imaging evidence of vascular dementia.  Seroquel was used as needed.  CKD creatinine stable 1.30   Blood pressures were monitored on TID basis and soft and monitored     Rehab course: During patient's stay in rehab weekly team conferences were held to monitor patient's progress, set goals and discuss barriers to discharge. At admission, patient required minimal assist 140 feet rolling walker minimal assist sit to stand  Physical exam.  Blood pressure 111/69 pulse 75 temperature 97.7 respirations 18 oxygen saturation is 97% room air Constitutional.  No acute distress HEENT Head.  Normocephalic and  atraumatic Eyes.  Pupils round and reactive to light  no discharge without nystagmus Neck.  Supple nontender no JVD without thyromegaly Cardiac regular rate and rhythm without any extra sounds or murmur heard Abdomen.  Soft nontender positive bowel sounds without rebound Respiratory effort normal no respiratory distress without wheeze Skin.  No evidence of breakdown Neurologic.  Cranial nerves II through XII intact, motor strength 5/5 in bilateral deltoid bicep tricep grip hip flexors knee extensors ankle dorsi plantarflexion Full range of motion in all 4 extremities.  He/She  has had improvement in activity tolerance, balance, postural control as well as ability to compensate for deficits. He/She has had improvement in functional use RUE/LUE  and RLE/LLE as well as improvement in awareness.  Working with energy conservation techniques.  Stand pivot with hand-held assist.  Ambulates 130 feet rolling walker contact-guard navigating stairs with minimal assist.  ADLs focus on safety with complete squat pivot transfers bed to wheelchair contact-guard.  Patient completed shower at overall supervision level upper and lower body contact-guard.  Speech therapy follow-up patient required supervision assist verbal cues for safety.  Full family teaching completed plan discharged home       Disposition: Discharge to home    Diet: Soft  Special Instructions: No driving smoking or alcohol  Medications at discharge 1.  Tylenol as needed 2.  Amiodarone 200 mg p.o. daily 3.  Eliquis 5 mg p.o. twice daily 4.  Vitamin B12 1000 mcg p.o. daily 5.  Lexapro 5 mg p.o. nightly 6.  Pepcid 20 mg p.o. daily 7.  Synthroid 100 mcg p.o. daily 8.  Melatonin 5 mg p.o. nightly as needed sleep 9.  ProAmatine 5 mg p.o. twice daily 10.  MiraLAX daily hold for loose stools 11.  Crestor 10 mg daily 12.cyanocobalmin 1000 mcg p.o. daily 13.Buspar 5 mg TID  30-35 minutes were spent completing discharge summary and  discharge planning  Discharge Instructions     Ambulatory referral to Physical Medicine Rehab   Complete by: As directed    Moderate complexity follow-up 1 to 2 weeks right MCA infarction   Ambulatory referral to Physical Medicine Rehab   Complete by: As directed    Moderate complexity follow up 1-2 weeks right MCA infarction         Signed: Cathlyn Parsons 12/26/2022, 5:26 AM

## 2022-12-20 DIAGNOSIS — I6329 Cerebral infarction due to unspecified occlusion or stenosis of other precerebral arteries: Secondary | ICD-10-CM | POA: Diagnosis not present

## 2022-12-20 MED ORDER — SENNOSIDES-DOCUSATE SODIUM 8.6-50 MG PO TABS
2.0000 | ORAL_TABLET | Freq: Two times a day (BID) | ORAL | Status: DC
  Administered 2022-12-20 – 2022-12-25 (×8): 2 via ORAL
  Filled 2022-12-20 (×9): qty 2

## 2022-12-20 MED ORDER — QUETIAPINE FUMARATE 25 MG PO TABS: 12.5000 mg | ORAL_TABLET | Freq: Every day | ORAL | Status: DC | PRN

## 2022-12-20 NOTE — Progress Notes (Signed)
PROGRESS NOTE   Subjective/Complaints:  Pt oriented to person and place , not time    ROS:  Denies abd pain, N/V, no breathing issues   Objective:   No results found. No results for input(s): "WBC", "HGB", "HCT", "PLT" in the last 72 hours.  No results for input(s): "NA", "K", "CL", "CO2", "GLUCOSE", "BUN", "CREATININE", "CALCIUM" in the last 72 hours.   Intake/Output Summary (Last 24 hours) at 12/20/2022 0759 Last data filed at 12/20/2022 0750 Gross per 24 hour  Intake 1040 ml  Output --  Net 1040 ml          Physical Exam: Vital Signs Blood pressure (!) 145/83, pulse 74, temperature 97.8 F (36.6 C), resp. rate 18, height '5\' 7"'$  (1.702 m), weight 64 kg, SpO2 96 %.   General: No acute distress Mood and affect are appropriate Heart: Regular rate and rhythm no rubs murmurs or extra sounds Lungs: Clear to auscultation, breathing unlabored, no rales or wheezes Abdomen: Positive bowel sounds, soft nontender to palpation, nondistended Extremities: No clubbing, cyanosis, or edema  Neurologic: oriented to person. Cranial nerves II through XII intact, motor strength is 4/5 in bilateral deltoid, bicep, tricep, grip, hip flexor, knee extensors, ankle dorsiflexor and plantar flexor,  Oriented to person , place month and year  Musculoskeletal: Full range of motion in all 4 extremities. No joint swelling   Assessment/Plan: 1. Functional deficits which require 3+ hours per day of interdisciplinary therapy in a comprehensive inpatient rehab setting. Physiatrist is providing close team supervision and 24 hour management of active medical problems listed below. Physiatrist and rehab team continue to assess barriers to discharge/monitor patient progress toward functional and medical goals  Care Tool:  Bathing    Body parts bathed by patient: Right arm, Left arm, Chest, Abdomen, Front perineal area, Right upper leg, Left  upper leg, Face   Body parts bathed by helper: Right lower leg, Buttocks, Left lower leg     Bathing assist Assist Level: Moderate Assistance - Patient 50 - 74%     Upper Body Dressing/Undressing Upper body dressing   What is the patient wearing?: Pull over shirt    Upper body assist Assist Level: Minimal Assistance - Patient > 75%    Lower Body Dressing/Undressing Lower body dressing      What is the patient wearing?: Pants     Lower body assist Assist for lower body dressing: Moderate Assistance - Patient 50 - 74%     Toileting Toileting    Toileting assist Assist for toileting: Moderate Assistance - Patient 50 - 74%     Transfers Chair/bed transfer  Transfers assist     Chair/bed transfer assist level: Contact Guard/Touching assist Chair/bed transfer assistive device: Armrests, Programmer, multimedia   Ambulation assist      Assist level: Contact Guard/Touching assist Assistive device: Walker-rolling Max distance: 123f   Walk 10 feet activity   Assist     Assist level: Minimal Assistance - Patient > 75% Assistive device: Walker-rolling   Walk 50 feet activity   Assist    Assist level: Minimal Assistance - Patient > 75% Assistive device: Walker-rolling    Walk 150 feet  activity   Assist Walk 150 feet activity did not occur: Safety/medical concerns (impaired endurance)         Walk 10 feet on uneven surface  activity   Assist Walk 10 feet on uneven surfaces activity did not occur: Safety/medical concerns         Wheelchair     Assist Is the patient using a wheelchair?: Yes (used for transportation) Type of Wheelchair: Manual    Wheelchair assist level: Dependent - Patient 0%      Wheelchair 50 feet with 2 turns activity    Assist        Assist Level: Dependent - Patient 0%   Wheelchair 150 feet activity     Assist      Assist Level: Dependent - Patient 0%   Blood pressure (!) 145/83,  pulse 74, temperature 97.8 F (36.6 C), resp. rate 18, height '5\' 7"'$  (1.702 m), weight 64 kg, SpO2 96 %.  Medical Problem List and Plan: 1. 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  Antithrombotics: -DVT/anticoagulation:  Pharmaceutical: Eliquis             -antiplatelet therapy: N/A 3. Pain Management: Tylenol as needed  5. Neuropsych/cognition: This patient is capable of making personal healthcare with assist but not financial decisions on her own behalf  Has clinical an dimaging study evidence of vascular dementia 6. Skin/Wound Care: Routine skin checks 7. Fluids/Electrolytes/Nutrition: Routine in and outs with follow-up chemistries 8.  Atrial fibrillation.  Continue amiodarone 200 mg daily.  Eliquis as directed.  Cardiac rate controlled 9.  Orthostasis improved .  Continue ProAmatine,  Increased to TID as pt still has orthostatic drops with mild symptoms, however no sys BPs<26mHg have been recorded   10.  Hypothyroidism.  Continue Synthroid  11.  Constipation- improved 2 lg BM yesterday , will reduce senna to 2 tabs bid  12. Agitation improved  seroquel '25mg'$  daily prn to help calm patient still takes ~4pm each day will reduce to 12.5 mg  13. Chronic anxiety: start lexapro '5mg'$  HS  LOS: 10 days A FACE TO FACE EVALUATION WAS PERFORMED  ACharlett Blake1/10/2023, 7:59 AM

## 2022-12-20 NOTE — Progress Notes (Signed)
Occupational Therapy Session Note  Patient Details  Name: Leslie [name] MRN: [id number] Date of Birth: 1941/11/30  Today's Date: 12/20/2022 OT Individual Time: 1100-1115 OT Individual Time Calculation (min): 15 min  OT Individual Time: 1130-1200 OT Individual Time Calculation (min): 30 min  OT Individual Time: 3428-7681 OT Individual Time Calculation (min): 29 min   Short Term Goals: Week 1:  OT Short Term Goal 1 (Week 1): Pt will attend to L visual field/side of body during functional activities with min verbal cues OT Short Term Goal 1 - Progress (Week 1): Met OT Short Term Goal 2 (Week 1): Pt will tolerate standing using LRAD during functional activties/BADLs for 3 minutes OT Short Term Goal 2 - Progress (Week 1): Met OT Short Term Goal 3 (Week 1): Pt will complete toilet transfer CGA using LRAD OT Short Term Goal 3 - Progress (Week 1): Met OT Short Term Goal 4 - Progress (Week 1): Met  Skilled Therapeutic Interventions/Progress Updates:     AM Session:  1100-1115: Attempted to see Pt at this time. Pt agitated and requesting to return to bed upon OT arrival d/t perceived back pain and fatigue. Pt reporting "I am a back Pt, I need to rest so my back will heal." Pt reoriented to reason for current hospital stay with Pt verbalizing understanding when provided mod education and encouragement. Assisted Pt back to bed. Pt sit>stand using RW CGA with min cues to recall hand placement. Pt ambulated to EOB>supine CGA. Offered to assit Pt contacting Rn for pain medications, but Pt refusing need. Assisted Pt to side lying position to reduce back pain with education provided. Pt was left resting in bed with call bell in reach, bed alarm on, and all needs met. Informed Pt OT would return after a short rest break to check in on status.   11:30-1200: Pt received side lying in bed. Pt reporting 0/10 pain and presenting to be in good spirits. Pt receptive to skilled OT session with a focus on BUE  strengthening and coordination. Pt completed seated BUE exercises using yellow therband with OT providing demonstration of exercises for Pt to mirror. Pt required max verbal cues to complete exercises for body mechanics, technique, and to maintain attention. Pt perseverating on BM from previous day and unable to be redirected throughout exercises requiring increased cues for attention to task. Pt completed 2x10 reps of chest pull, diagonal shoulder pulls, and therapist anchored bicep curls. Pt fatigued following exercises and requesting to return to bed. Sit>stand using RW CGA mod cues for safety. Pt returned to bed close supervision and was left resting in bed with call bell in reach, bed alarm on, and all needs met.   PM Session: Pt received in room sitting EOB with Pt present. Pt presenting to be in good spirits and receptive to skilled OT session. TEDs and abdominal binder donned on Pt upon OT arrival. Pt educated OT on what she completed in Pt recalling recent events of bathroom visit. Pt was handed off from PT to OT. Focus this session L side attention and working memory.   Pt educated on L side inattention and impact on safety/functional status. Pt receptive of education and recalling need to turn head to L side for visual scanning during BADLs and functional tasks with Pt verbalizing understanding. Pt completed dual tasking activity with Pt instructed to ambulate down hallway while visually scanning to R/L to locate room numbers. Pt able to ambulated down hallway x2 with rest break provided in  between rounds with CGA and mod verbal cues to stay within walker, pacing, and head alignment. Pt required mod cues to locate number on L side and min to locate on R d/t attention deficit. Hallway initially quiet during task, but increasingly busy at end of hallway with Pt requiring increased cues for direction and attention. Pt returned to bed close supervision and was left resting in bed with call bell in reach,  bed alarm on, and all needs met. TEDs and abdominal binder doffed at end of session.   Therapy Documentation Precautions:  Precautions Precautions: Fall, Other (comment) Precaution Comments: orthostatic, TEDs and ABD binder Restrictions Weight Bearing Restrictions: No Other Position/Activity Restrictions: TED hose, abdominal binder, ace wraps General:   Vital Signs:  Pain: Pain Assessment Pain Scale: 0-10 Pain Score: 0-No pain ADL: ADL Eating: Supervision/safety Where Assessed-Eating: Bed level Grooming: Moderate assistance Where Assessed-Grooming: Bed level Upper Body Bathing: Minimal assistance Where Assessed-Upper Body Bathing: Bed level Lower Body Bathing: Moderate assistance Where Assessed-Lower Body Bathing: Bed level Upper Body Dressing: Minimal assistance Where Assessed-Upper Body Dressing: Bed level Lower Body Dressing: Minimal assistance Where Assessed-Lower Body Dressing: Bed level Toileting: Moderate assistance Where Assessed-Toileting: Bedside Commode Toilet Transfer: Minimal assistance Toilet Transfer Method: Stand pivot Toilet Transfer Equipment: Bedside commode Tub/Shower Transfer: Not assessed Social research officer, government: Not assessed    Other Treatments:     Therapy/Group: Individual Therapy  Janey Genta 12/20/2022, 12:55 PM

## 2022-12-20 NOTE — Progress Notes (Signed)
Physical Therapy Session Note  Patient Details  Name: Leslie [name] MRN: [id number] Date of Birth: 11/14/1941  Today's Date: 12/20/2022 PT Individual Time: 1335-1435 PT Individual Time Calculation (min): 60 min   Short Term Goals: Week 2:  PT Short Term Goal 1 (Week 2): = to LTGs based on ELOS  Skilled Therapeutic Interventions/Progress Updates:    Pt received supine in bed awake and apologizing for missing earlier OT session stating she was having significant back pain at that time. Pt eager to participate now and during session reports she is "glad" that she is participating because she wants to "continue to get better." Supine vitals: BP 120/62 (MAP 78), HR 75bpm  Donned thigh high TED hose.  Supine>sitting R EOB, HOB partially elevated, with supervision.  Sitting vitals: BP 95/72 (MAP 80), HR 79bpm  Pt spontaneously trying to return to supine, which is her way of indicating she is experiencing low BP symptoms due to her impaired attention. Donned abdominal binder and able to perform stand pivot to w/c using R HHA with CGA for steadying/balance.  Reassessed vitals in w/c: BP 115/64 (MAP 80), HR 77bpm   Gait training ~166f to ADL apartment recliner using RW with CGA for steadying/safety - pt demos improved upright posture, closer proximity maintained to RW (showing carryover of education yesterday), and decreased step lengths although achieving min reciprocal pattern with slow gait speed.  Sit>stand from recliner to RW with min assist for lifting to stand and maintain balance from this low surface - requires cuing for pushing up with both hands from seat to increase safety and independence.  Gait training in ADL apartment kitchen using RW focusing on safe AD management at the counter when opening/closing top cabinets to retrieve items (plates, bowls, and cups) with cuing to use L hand for NMR - pt requires visual demonstration with verbal education on walking RW up, facing the  counter and stepping inside of it and then performing side stepping if needing to move down to the next cabinet - requires CGA with a few instances of light min assist for balance while performing the side stepping until improved sequencing resulting in improved balance. Pt continues to have perseverative behaviors with difficulty redirecting attention to a new task or different portion of the same task.   Concern pt had been incontinent therefore transported back to her room for toileting. Gait in/out bathroom using RW with CGA for safety. During toileting pt impulsive frequently initiating coming to stand with poor awareness that therapist needing to retrieve a clean brief and when in standing initiates pulling up LB clothing before giving time for peri-care - requires max cuing for sequencing of the task to ensure pt's safety. Pt had been incontinent of bladder in brief and further continent on toilet.   At end of session, pt left sitting on EOB in care of OT.   Therapy Documentation Precautions:  Precautions Precautions: Fall, Other (comment) Precaution Comments: orthostatic, TEDs and ABD binder Restrictions Weight Bearing Restrictions: No Other Position/Activity Restrictions: TED hose, abdominal binder, ace wraps   Pain:  Reports earlier back pain limited her participation in therapy, but no complaints of pain during this session.   Therapy/Group: Individual Therapy  CTawana Scale, PT, DPT, NCS, CSRS 12/20/2022, 8:09 AM

## 2022-12-20 NOTE — Progress Notes (Signed)
Patient ID: Leslie [name], female   DOB: 1941/08/04, 82 y.o.   MRN: [id number]  Family education Tuesday 1/16 9-12.

## 2022-12-20 NOTE — Progress Notes (Signed)
Speech Language Pathology Daily Session Note  Patient Details  Name: Leslie [name] MRN: [id number] Date of Birth: 1941-07-06  Today's Date: 12/20/2022 SLP Individual Time: 0930-1027 SLP Individual Time Calculation (min): 57 min  Short Term Goals: Week 2: SLP Short Term Goal 1 (Week 2): STG=LTG due to ELOS  Skilled Therapeutic Interventions: Skilled treatment session focused on cognitive goals. Upon arrival, patient was awake while upright in the recliner. Patient was writing impulsively in her memory notebook without any type of organization or reasoning observed. Patient also unable to utilize appropriately as patient required total A to locate an external aid for orientation to date. SLP facilitated session by providing a 3 ring binder with 3 simple tabs made per patient's preference (calendar, therapy schedule, notes) in order for patient to easily locate information. SLP also provided another calendar with increased visual cues for orientation to date. At end of session, patient able to locate specific items within the notebook with extra time and overall Min verbal and visual cues. SLP also provided education regarding writing only pertinent information and keeping all information within the lines of the paper. A visual anchor was also utilized to maximize attention to left for visual scanning. Patient left upright in recliner with alarm on and all needs within reach. Continue with current plan of care.      Pain No/Denies Pain   Therapy/Group: Individual Therapy  Jazzalynn Rhudy 12/20/2022, 3:30 PM

## 2022-12-21 ENCOUNTER — Other Ambulatory Visit (HOSPITAL_COMMUNITY): Payer: Self-pay

## 2022-12-21 MED ORDER — FAMOTIDINE 20 MG PO TABS
20.0000 mg | ORAL_TABLET | Freq: Every day | ORAL | 0 refills | Status: AC
  Filled 2022-12-21 – 2022-12-24 (×2): qty 30, 30d supply, fill #0

## 2022-12-21 MED ORDER — LEVOTHYROXINE SODIUM 100 MCG PO TABS
100.0000 ug | ORAL_TABLET | Freq: Every day | ORAL | 0 refills | Status: AC
  Filled 2022-12-21 – 2022-12-24 (×2): qty 30, 30d supply, fill #0

## 2022-12-21 MED ORDER — AMIODARONE HCL 200 MG PO TABS
200.0000 mg | ORAL_TABLET | Freq: Every day | ORAL | 0 refills | Status: AC
  Filled 2022-12-21 – 2022-12-24 (×2): qty 30, 30d supply, fill #0

## 2022-12-21 MED ORDER — QUETIAPINE FUMARATE 25 MG PO TABS
25.0000 mg | ORAL_TABLET | Freq: Every day | ORAL | Status: DC | PRN
  Administered 2022-12-21 – 2022-12-25 (×5): 25 mg via ORAL
  Filled 2022-12-21 (×6): qty 1

## 2022-12-21 MED ORDER — MIDODRINE HCL 5 MG PO TABS
5.0000 mg | ORAL_TABLET | Freq: Two times a day (BID) | ORAL | 0 refills | Status: AC
  Filled 2022-12-21 – 2022-12-24 (×2): qty 60, 30d supply, fill #0

## 2022-12-21 MED ORDER — CYANOCOBALAMIN 1000 MCG PO TABS
1000.0000 ug | ORAL_TABLET | Freq: Every day | ORAL | 0 refills | Status: AC
  Filled 2022-12-21 – 2022-12-24 (×2): qty 30, 30d supply, fill #0

## 2022-12-21 MED ORDER — ROSUVASTATIN CALCIUM 10 MG PO TABS
10.0000 mg | ORAL_TABLET | Freq: Every day | ORAL | 0 refills | Status: AC
  Filled 2022-12-21 – 2022-12-24 (×2): qty 30, 30d supply, fill #0

## 2022-12-21 MED ORDER — APIXABAN 5 MG PO TABS
5.0000 mg | ORAL_TABLET | Freq: Two times a day (BID) | ORAL | 0 refills | Status: AC
  Filled 2022-12-21 – 2022-12-24 (×2): qty 60, 30d supply, fill #0

## 2022-12-21 MED ORDER — ESCITALOPRAM OXALATE 5 MG PO TABS
5.0000 mg | ORAL_TABLET | Freq: Every day | ORAL | 0 refills | Status: AC
  Filled 2022-12-21 – 2022-12-24 (×2): qty 30, 30d supply, fill #0

## 2022-12-21 MED ORDER — POLYETHYLENE GLYCOL 3350 17 G PO PACK: 17.0000 g | PACK | Freq: Every day | ORAL | 0 refills | Status: AC

## 2022-12-21 NOTE — Progress Notes (Signed)
Occupational Therapy Session Note  Patient Details  Name: Leslie [name] MRN: [id number] Date of Birth: March 14, 1941  Today's Date: 12/21/2022 OT Individual Time: 0626-9485 OT Individual Time Calculation (min): 58 min    Short Term Goals: Week 2:  OT Short Term Goal 1 (Week 2): STG=LTG (d/t pt lenght of stay)  Skilled Therapeutic Interventions/Progress Updates:      Therapy Documentation Precautions:  Precautions Precautions: Fall, Other (comment) Precaution Comments: orthostatic, TEDs and ABD binder Restrictions Weight Bearing Restrictions: No Other Position/Activity Restrictions: TED hose, abdominal binder, ace wraps   Pain:No c/o pain   IOE:[id number] seen for am ADL including shower. Patient received performing toileting. Reported being very tired, but willing to participate with OT treatment session. Patient reporting having difficulty focusing due to concern with her room being disorganized. Patient able to focus on treatment after discussing plan to get room organized once patient was dressed. Patient's ADL levels listed below. No adaptive equipment required except the use of a shower chair and grab bars during bathing and toileting. Once patient finished grooming tasks, facilitated patient's use of the memory note book on the bedside table. Helped patient get the papers back in the binder and reviewed the daily schedule. Assisted patient to get her bedside table organized to reduce patient's anxiety. Discussed treatment plan, hospital stay and discharge as it related to reducing patient's anxieties and concerns. Patient responded well to discussing care plan and seemed less anxious. Continue with skilled OT POC to facilitate improved independence with self care skills.   Grooming: Min assistance Where Assessed-Grooming: standing at sink Upper Body Bathing: close SBA Where Assessed-Upper Body Bathing: shower Lower Body Bathing: Moderate assistance Where Assessed-Lower Body  Bathing: shower Upper Body Dressing: set-up assistance Where Assessed-Upper Body Dressing: w/c level Lower Body Dressing: Moderate assistance Where Assessed-Lower Body Dressing: w/c Toileting: Moderate assistance Where Assessed-Toileting: toilet Toilet Transfer: Minimal assistance Toilet Transfer Method: Stand pivot Toilet Transfer Equipment: Sport and exercise psychologist Transfer: [place]- L inattention    Balance- Fair standing balance with walker     Therapy/Group: Individual Therapy  Hermina Barters 12/21/2022, 12:14 PM

## 2022-12-21 NOTE — Progress Notes (Signed)
PROGRESS NOTE   Subjective/Complaints:  Poor sleep last night discussed with nursing as well as with OT.   ROS:  Denies abd pain, N/V, no breathing issues   Objective:   No results found. No results for input(s): "WBC", "HGB", "HCT", "PLT" in the last 72 hours.  No results for input(s): "NA", "K", "CL", "CO2", "GLUCOSE", "BUN", "CREATININE", "CALCIUM" in the last 72 hours.   Intake/Output Summary (Last 24 hours) at 12/21/2022 0906 Last data filed at 12/20/2022 1752 Gross per 24 hour  Intake 420 ml  Output --  Net 420 ml          Physical Exam: Vital Signs Blood pressure (!) 146/75, pulse 64, temperature 98 F (36.7 C), resp. rate 16, height '5\' 7"'$  (1.702 m), weight 64 kg, SpO2 98 %.   General: No acute distress Mood and affect are appropriate Heart: Regular rate and rhythm no rubs murmurs or extra sounds Lungs: Clear to auscultation, breathing unlabored, no rales or wheezes Abdomen: Positive bowel sounds, soft nontender to palpation, nondistended Extremities: No clubbing, cyanosis, or edema  Neurologic: oriented to person. Cranial nerves II through XII intact, motor strength is 4/5 in bilateral deltoid, bicep, tricep, grip, hip flexor, knee extensors, ankle dorsiflexor and plantar flexor,  Oriented to person , place month and year  Musculoskeletal: Full range of motion in all 4 extremities. No joint swelling   Assessment/Plan: 1. Functional deficits which require 3+ hours per day of interdisciplinary therapy in a comprehensive inpatient rehab setting. Physiatrist is providing close team supervision and 24 hour management of active medical problems listed below. Physiatrist and rehab team continue to assess barriers to discharge/monitor patient progress toward functional and medical goals  Care Tool:  Bathing    Body parts bathed by patient: Right arm, Left arm, Chest, Abdomen, Front perineal area, Right  upper leg, Left upper leg, Face   Body parts bathed by helper: Left lower leg, Right lower leg, Buttocks     Bathing assist Assist Level: Minimal Assistance - Patient > 75%     Upper Body Dressing/Undressing Upper body dressing   What is the patient wearing?: Bra, Pull over shirt    Upper body assist Assist Level: Set up assist    Lower Body Dressing/Undressing Lower body dressing      What is the patient wearing?: Pants, Incontinence brief     Lower body assist Assist for lower body dressing: Moderate Assistance - Patient 50 - 74%     Toileting Toileting    Toileting assist Assist for toileting: Moderate Assistance - Patient 50 - 74%     Transfers Chair/bed transfer  Transfers assist     Chair/bed transfer assist level: Contact Guard/Touching assist Chair/bed transfer assistive device: Armrests, Programmer, multimedia   Ambulation assist      Assist level: Contact Guard/Touching assist Assistive device: Walker-rolling Max distance: 167f   Walk 10 feet activity   Assist     Assist level: Minimal Assistance - Patient > 75% Assistive device: Walker-rolling   Walk 50 feet activity   Assist    Assist level: Minimal Assistance - Patient > 75% Assistive device: Walker-rolling    Walk 150  feet activity   Assist Walk 150 feet activity did not occur: Safety/medical concerns (impaired endurance)         Walk 10 feet on uneven surface  activity   Assist Walk 10 feet on uneven surfaces activity did not occur: Safety/medical concerns         Wheelchair     Assist Is the patient using a wheelchair?: Yes (used for transportation) Type of Wheelchair: Manual    Wheelchair assist level: Dependent - Patient 0%      Wheelchair 50 feet with 2 turns activity    Assist        Assist Level: Dependent - Patient 0%   Wheelchair 150 feet activity     Assist      Assist Level: Dependent - Patient 0%   Blood  pressure (!) 146/75, pulse 64, temperature 98 F (36.7 C), resp. rate 16, height '5\' 7"'$  (1.702 m), weight 64 kg, SpO2 98 %.  Medical Problem List and Plan: 1. 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  Antithrombotics: -DVT/anticoagulation:  Pharmaceutical: Eliquis             -antiplatelet therapy: N/A 3. Pain Management: Tylenol as needed  5. Neuropsych/cognition: This patient is capable of making personal healthcare with assist but not financial decisions on her own behalf  Has clinical an dimaging study evidence of vascular dementia 6. Skin/Wound Care: Routine skin checks 7. Fluids/Electrolytes/Nutrition: Routine in and outs with follow-up chemistries 8.  Atrial fibrillation.  Continue amiodarone 200 mg daily.  Eliquis as directed.  Cardiac rate controlled 9.  Orthostasis improved .  Continue ProAmatine,  Increased to TID as pt still has orthostatic drops with mild symptoms, however no sys BPs<71mHg have been recorded   10.  Hypothyroidism.  Continue Synthroid  11.  Constipation- improved 2 lg BM yesterday , will reduce senna to 2 tabs bid  12. Agitation improved  seroquel '25mg'$  daily prn to help calm patient still takes ~4pm each day will reduce to 12.5 mg, patient slept worse will go back to 25 mg 13. Chronic anxiety: start lexapro '5mg'$  HS  LOS: 11 days A FACE TO FACE EVALUATION WAS PERFORMED  ACharlett Blake1/11/2023, 9:06 AM

## 2022-12-21 NOTE — Progress Notes (Signed)
Speech Language Pathology Daily Session Note  Patient Details  Name: Leslie [name] MRN: [id number] Date of Birth: 13-Oct-1941  Today's Date: 12/21/2022 SLP Individual Time: 3149-7026 SLP Individual Time Calculation (min): 30 min  Short Term Goals: Week 2: SLP Short Term Goal 1 (Week 2): STG=LTG due to ELOS  Skilled Therapeutic Interventions: Skilled treatment session focused on cognitive goals. Upon arrival, patient was reclined in bed and reporting anxiety due to difficulty utilizing her cell phone to call her daughter. SLP facilitated session by providing Max A verbal cues for patient to locate and sequence steps to locate her daughter's number in the phone. Patient left a message on her daughter's phone but was unable to recall this info after a 2 minute delay as patient requesting to call her daughter again. SLP also facilitated session by reorganizing the patient's memory notebook as it appeared she had removed and rearranged information over night. Once reorganized, patient able to locate information with extra time and Mod verbal and visual cues. Patient left upright in bed with alarm on and all needs within reach. Continue with current plan of care.      Pain Pain Assessment Pain Scale: 0-10 Pain Score: 0-No pain  Therapy/Group: Individual Therapy  Kashayla Ungerer 12/21/2022, 12:37 PM

## 2022-12-21 NOTE — Progress Notes (Signed)
Occupational Therapy Session Note  Patient Details  Name: Leslie [name] MRN: [id number] Date of Birth: [birth year]  Today's Date: 12/21/2022 OT Individual Time: 5993-5701 OT Individual Time Calculation (min): 44 min    Short Term Goals: Week 2:  OT Short Term Goal 1 (Week 2): STG=LTG (d/t pt lenght of stay)  Skilled Therapeutic Interventions/Progress Updates:    Pt greeted sitting in wc trying to get up and get back in bed. OT able to redirect pt and agreeable to go to the gym. Worked on standing balance/endurance, L hand fine motor coordination, and visual spatial skills with small peg board puzzle. Pt needed moderate cues to follow the pattern. She was able to point out her error, but had difficulty then correcting it. Had pt use tweezers to remove pegs with cues to maintain pinch on tweezers to get them all the way back into the box. Standing balance standing on foam block with min A, then reaching overheaed with unilateral UE. Pt fearful of reaching overhead with both hands at the same time. Pt impulsive with poor safety awareness throughout session. She also needed cues to stay on task. 3 sets of 10 chest press, bicep curl, and straight arm raise. Incorporated memory by trying to have pt recall the three exercises each time. She could demonstrate them in order on the third trial with min cues. Pt returned to room and pivoted back to bed with min HHA. Pt left semi-reclined in bed with bed alarm on, call bell in reach, and needs met.   Therapy Documentation Precautions:  Precautions Precautions: Fall, Other (comment) Precaution Comments: orthostatic, TEDs and ABD binder Restrictions Weight Bearing Restrictions: No Other Position/Activity Restrictions: TED hose, abdominal binder, ace wraps Pain:  Denies pain   Therapy/Group: Individual Therapy  Valma Cava 12/21/2022, 2:54 PM

## 2022-12-21 NOTE — Progress Notes (Signed)
Physical Therapy Session Note  Patient Details  Name: Leslie [name] MRN: [id number] Date of Birth: Feb 24, 1941  Today's Date: 12/21/2022 PT Individual Time: 0910-1008 PT Individual Time Calculation (min): 58 min   Short Term Goals: Week 1:  PT Short Term Goal 1 (Week 1): Pt will perform supine<>sit with supervision consistently PT Short Term Goal 1 - Progress (Week 1): Met PT Short Term Goal 2 (Week 1): Pt will perform sit<>stands using LRAD with CGA consistently PT Short Term Goal 2 - Progress (Week 1): Met PT Short Term Goal 3 (Week 1): Pt will perform bed<>chair transfers using LRAD with CGA consistently PT Short Term Goal 3 - Progress (Week 1): Met PT Short Term Goal 4 (Week 1): Pt will ambulate at least 149f using LRAD with CGA PT Short Term Goal 4 - Progress (Week 1): Met PT Short Term Goal 5 (Week 1): Pt will navigate 4 steps using HRs with CGA PT Short Term Goal 5 - Progress (Week 1): Progressing toward goal Week 2:  PT Short Term Goal 1 (Week 2): = to LTGs based on ELOS   Skilled Therapeutic Interventions/Progress Updates:  Patient seated upright in w/c on entrance to room. Patient alert and agreeable to PT session.   Patient with no pain complaint at start of session.  Prior to standing, pt's BP taken in sitting with no abdominal binder. BP = 130/ 112 with pulse = 76.   Pt performed sit<>stand and stand pivot transfers throughout session with supervision. Attempted sit<>stand to no AD and pt reaching out for hand hold on therapist. Encouraged to find balance. Provided verbal cues for hand positioning.  Pt ambulated ~150 ft using RW with CGA and w/c follow for fatigue. Demonstrated crowding to L side of RW. Provided vc/ tc for maintaining midline in RW.  BP taken just following ambulation with abdominal binder donned. BP = 109/ 72 with pulse = 86.  After sitting for 371m, BP = 115/ 73 with pulse = 75.  Pt ambulated 8555t using RW with supervision, to the NuStep  machine in day room. Pt guided in continuous reciprocation of BLE only using NuStep L2 x 1046mwith focus on increasing and maintaining speed. Requires consistent vc for speed with pt able to maintain between 15-40 steps/ min and 1.2-1.7 METs. BP taken while participating in NuStep activity with reading at 135/ 69 and pulse = 73.  Final BP taken while seated on EOB at 5mi52mark and BP = 103/ 69 and pulse = 70.  Pt performed sit-->supine at end of session with distant supervision. No cueing required for technique. Patient supine in bed at end of session with brakes locked, bed alarm set, abdominal binder doffed, and all needs within reach.   Therapy Documentation Precautions:  Precautions Precautions: Fall, Other (comment) Precaution Comments: orthostatic, TEDs and ABD binder Restrictions Weight Bearing Restrictions: No Other Position/Activity Restrictions: TED hose, abdominal binder, ace wraps General:   Vital Signs: Therapy Vitals Temp: 98.2 F (36.8 C) Temp Source: Oral Pulse Rate: 63 Resp: 17 BP: (!) 146/70 Patient Position (if appropriate): Sitting Oxygen Therapy SpO2: 97 % O2 Device: Room Air Pain:  No pain related this session.   Therapy/Group: Individual Therapy  JuliAlger Simons DPT, CSRS 12/21/2022, 9:21 PM

## 2022-12-22 NOTE — Progress Notes (Signed)
Physical Therapy Session Note  Patient Details  Name: Leslie [name] MRN: [id number] Date of Birth: 1941-05-10  Today's Date: 12/22/2022 PT Individual Time: 3818-2993 PT Individual Time Calculation (min): 40 min   Short Term Goals: Week 2:  PT Short Term Goal 1 (Week 2): = to LTGs based on ELOS  Skilled Therapeutic Interventions/Progress Updates:    Pt received awake, supine in bed and agreeable to therapy session. Donned thigh high TED hose.  Pt perseverating on talking about the fact that she has been told she has fallen twice and upset that she feels that staff are not explaining things to her and telling her everything she tells them is "just a dream." Therapist provided emotional support and encouragement to pt and educated her on planned D/C date and her CLOF.  Supine>sitting R EOB, HOB flat and using bedrail, with increased time/effort to bring trunk upright. Donned tennis shoes total assist for time management. Sit>stand EOB>RW with close supervision.  Short distance ~39f ambulatory transfer to w/c using RW with close supervision/CGA and cuing to turn fully prior to sitting. Sitting in w/c vitals: BP 115/79 (MAP 90), HR 79bpm  Gait training ~1447fto main therapy gym using RW with CGA for safety/steadying - pt with 1x minor posterior LOB requiring several small stepping strategies to maintain upright without increased physical assistance - cuing throughout to maintain upright posture (not flex forward), increase B LE step lengths with heel strike, and pt does well maintaining close proximity to AD.  Vitals in gym: BP 104/69 (MAP 79), HR 91bpm  Donned abdominal binder.  Dynamic gait training in gym to pick-up cones while using RW to address carryover/recall of education/training on proper AD management (staying close to AD and walking all the way up to object prior to reaching for it) - CGA for steadying throughout and pt demoing improved AD management with good recall of the  training.  Gait training ~14066fack to her room using RW with continued CGA and cuing as described above to improve gait mechanics.   Reinforced education throughout session on turning hips fully towards seat prior to initiating sitting down with improving implementation of this.  At end of session, pt left seated in recliner with B LEs elevated, abdominal binder removed for pt comfort, seat belt alarm on, and needs in reach.   Therapy Documentation Precautions:  Precautions Precautions: Fall, Other (comment) Precaution Comments: orthostatic, TEDs and ABD binder Restrictions Weight Bearing Restrictions: No Other Position/Activity Restrictions: TED hose, abdominal binder, ace wraps   Pain:  No reports of pain throughout session.   Therapy/Group: Individual Therapy  CarTawana ScalePT, DPT, NCS, CSRS 12/22/2022, 8:01 AM

## 2022-12-22 NOTE — Progress Notes (Signed)
PROGRESS NOTE   Subjective/Complaints:  Pt reports improved sleep last noc  Remains confused as per baseline   ROS:  Denies abd pain, N/V, no breathing issues   Objective:   No results found. No results for input(s): "WBC", "HGB", "HCT", "PLT" in the last 72 hours.  No results for input(s): "NA", "K", "CL", "CO2", "GLUCOSE", "BUN", "CREATININE", "CALCIUM" in the last 72 hours.   Intake/Output Summary (Last 24 hours) at 12/22/2022 1219 Last data filed at 12/21/2022 1848 Gross per 24 hour  Intake 600 ml  Output --  Net 600 ml          Physical Exam: Vital Signs Blood pressure (!) 144/86, pulse 67, temperature 97.8 F (36.6 C), temperature source Oral, resp. rate 18, height '5\' 7"'$  (1.702 m), weight 64 kg, SpO2 99 %.   General: No acute distress Mood and affect are appropriate Heart: Regular rate and rhythm no rubs murmurs or extra sounds Lungs: Clear to auscultation, breathing unlabored, no rales or wheezes Abdomen: Positive bowel sounds, soft nontender to palpation, nondistended Extremities: No clubbing, cyanosis, or edema  Neurologic: oriented to person. Cranial nerves II through XII intact, motor strength is 4/5 in bilateral deltoid, bicep, tricep, grip, hip flexor, knee extensors, ankle dorsiflexor and plantar flexor,  Oriented to person , place month and year  Musculoskeletal: Full range of motion in all 4 extremities. No joint swelling   Assessment/Plan: 1. Functional deficits which require 3+ hours per day of interdisciplinary therapy in a comprehensive inpatient rehab setting. Physiatrist is providing close team supervision and 24 hour management of active medical problems listed below. Physiatrist and rehab team continue to assess barriers to discharge/monitor patient progress toward functional and medical goals  Care Tool:  Bathing    Body parts bathed by patient: Right arm, Left arm, Chest, Abdomen,  Front perineal area, Right upper leg, Left upper leg, Face   Body parts bathed by helper: Left lower leg, Right lower leg, Buttocks     Bathing assist Assist Level: Minimal Assistance - Patient > 75%     Upper Body Dressing/Undressing Upper body dressing   What is the patient wearing?: Bra, Pull over shirt    Upper body assist Assist Level: Set up assist    Lower Body Dressing/Undressing Lower body dressing      What is the patient wearing?: Pants, Incontinence brief     Lower body assist Assist for lower body dressing: Moderate Assistance - Patient 50 - 74%     Toileting Toileting    Toileting assist Assist for toileting: Moderate Assistance - Patient 50 - 74%     Transfers Chair/bed transfer  Transfers assist     Chair/bed transfer assist level: Contact Guard/Touching assist Chair/bed transfer assistive device: Armrests, Programmer, multimedia   Ambulation assist      Assist level: Contact Guard/Touching assist Assistive device: Walker-rolling Max distance: 152f   Walk 10 feet activity   Assist     Assist level: Minimal Assistance - Patient > 75% Assistive device: Walker-rolling   Walk 50 feet activity   Assist    Assist level: Minimal Assistance - Patient > 75% Assistive device: Walker-rolling  Walk 150 feet activity   Assist Walk 150 feet activity did not occur: Safety/medical concerns (impaired endurance)         Walk 10 feet on uneven surface  activity   Assist Walk 10 feet on uneven surfaces activity did not occur: Safety/medical concerns         Wheelchair     Assist Is the patient using a wheelchair?: Yes (used for transportation) Type of Wheelchair: Manual    Wheelchair assist level: Dependent - Patient 0%      Wheelchair 50 feet with 2 turns activity    Assist        Assist Level: Dependent - Patient 0%   Wheelchair 150 feet activity     Assist      Assist Level: Dependent -  Patient 0%   Blood pressure (!) 144/86, pulse 67, temperature 97.8 F (36.6 C), temperature source Oral, resp. rate 18, height '5\' 7"'$  (1.702 m), weight 64 kg, SpO2 99 %.  Medical Problem List and Plan: 1. 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  Antithrombotics: -DVT/anticoagulation:  Pharmaceutical: Eliquis             -antiplatelet therapy: N/A 3. Pain Management: Tylenol as needed  5. Neuropsych/cognition: This patient is capable of making personal healthcare with assist but not financial decisions on her own behalf  Has clinical an dimaging study evidence of vascular dementia 6. Skin/Wound Care: Routine skin checks 7. Fluids/Electrolytes/Nutrition: Routine in and outs with follow-up chemistries 8.  Atrial fibrillation.  Continue amiodarone 200 mg daily.  Eliquis as directed.  Cardiac rate controlled 9.  Orthostasis improved .  Continue ProAmatine,  Increased to TID as pt still has orthostatic drops with mild symptoms, however no sys BPs<26mHg have been recorded   10.  Hypothyroidism.  Continue Synthroid  11.  Constipation- improved 2 lg BM yesterday , will reduce senna to 2 tabs bid  12. Agitation improved  seroquel '25mg'$  daily prn to help calm patient still takes ~4pm each day will reduce to 12.5 mg, patient slept worse will go back to 25 mg 13. Chronic anxiety: start lexapro '5mg'$  HS  LOS: 12 days A FACE TO FACE EVALUATION WAS PERFORMED  ACharlett Blake1/13/2024, 12:19 PM

## 2022-12-23 MED ORDER — SORBITOL 70 % SOLN: 15.0000 mL | Freq: Once | Status: DC

## 2022-12-23 MED ORDER — BUSPIRONE HCL 5 MG PO TABS
5.0000 mg | ORAL_TABLET | Freq: Three times a day (TID) | ORAL | Status: DC
  Administered 2022-12-23 – 2022-12-26 (×10): 5 mg via ORAL
  Filled 2022-12-23 (×12): qty 1

## 2022-12-23 NOTE — Progress Notes (Signed)
Patient reports buspar is helping drastically with her anxiety and states this is the calmest she has ever been. Will continue plan of care.

## 2022-12-23 NOTE — Progress Notes (Signed)
PROGRESS NOTE   Subjective/Complaints:  Per staff, slept 3 hours overnight- just woke up.  Was extremely anxious/confused yesterday- even after seroquel Nursing wondering if there's any other options.   Pt says feels a little anxious this AM, "not bad".  Feels constipated- LBM 2 days ago per nursing- pt thought was 3 days ago.   ROS:  limited by cognition, however c/o constipation- and poor sleep and anxiety Objective:   No results found. No results for input(s): "WBC", "HGB", "HCT", "PLT" in the last 72 hours.  No results for input(s): "NA", "K", "CL", "CO2", "GLUCOSE", "BUN", "CREATININE", "CALCIUM" in the last 72 hours.   Intake/Output Summary (Last 24 hours) at 12/23/2022 1126 Last data filed at 12/23/2022 0710 Gross per 24 hour  Intake 60 ml  Output --  Net 60 ml         Physical Exam: Vital Signs Blood pressure (!) 149/78, pulse 66, temperature 97.7 F (36.5 C), temperature source Oral, resp. rate 18, height '5\' 7"'$  (1.702 m), weight 64 kg, SpO2 98 %.     General: awake, alert, sitting up in bed; just woke up; nursing at bedside;  NAD HENT: conjugate gaze; oropharynx moist CV: regular rate; no JVD Pulmonary: CTA B/L; no W/R/R- good air movement GI: soft, NT, a little distended; hypoactive BS Psychiatric: c/o anxiety and looks a little anxious- some psychomotor agitation Neurological: disoriented- a little confused Neurologic: oriented to person. Cranial nerves II through XII intact, motor strength is 4/5 in bilateral deltoid, bicep, tricep, grip, hip flexor, knee extensors, ankle dorsiflexor and plantar flexor,  Oriented to person , place month and year  Musculoskeletal: Full range of motion in all 4 extremities. No joint swelling   Assessment/Plan: 1. Functional deficits which require 3+ hours per day of interdisciplinary therapy in a comprehensive inpatient rehab setting. Physiatrist is providing close  team supervision and 24 hour management of active medical problems listed below. Physiatrist and rehab team continue to assess barriers to discharge/monitor patient progress toward functional and medical goals  Care Tool:  Bathing    Body parts bathed by patient: Right arm, Left arm, Chest, Abdomen, Front perineal area, Right upper leg, Left upper leg, Face   Body parts bathed by helper: Left lower leg, Right lower leg, Buttocks     Bathing assist Assist Level: Minimal Assistance - Patient > 75%     Upper Body Dressing/Undressing Upper body dressing   What is the patient wearing?: Bra, Pull over shirt    Upper body assist Assist Level: Set up assist    Lower Body Dressing/Undressing Lower body dressing      What is the patient wearing?: Pants, Incontinence brief     Lower body assist Assist for lower body dressing: Moderate Assistance - Patient 50 - 74%     Toileting Toileting    Toileting assist Assist for toileting: Moderate Assistance - Patient 50 - 74%     Transfers Chair/bed transfer  Transfers assist     Chair/bed transfer assist level: Contact Guard/Touching assist Chair/bed transfer assistive device: Armrests, Walker   Locomotion Ambulation   Ambulation assist      Assist level: Contact Guard/Touching assist Assistive device:  Walker-rolling Max distance: 184f   Walk 10 feet activity   Assist     Assist level: Minimal Assistance - Patient > 75% Assistive device: Walker-rolling   Walk 50 feet activity   Assist    Assist level: Minimal Assistance - Patient > 75% Assistive device: Walker-rolling    Walk 150 feet activity   Assist Walk 150 feet activity did not occur: Safety/medical concerns (impaired endurance)         Walk 10 feet on uneven surface  activity   Assist Walk 10 feet on uneven surfaces activity did not occur: Safety/medical concerns         Wheelchair     Assist Is the patient using a wheelchair?:  Yes (used for transportation) Type of Wheelchair: Manual    Wheelchair assist level: Dependent - Patient 0%      Wheelchair 50 feet with 2 turns activity    Assist        Assist Level: Dependent - Patient 0%   Wheelchair 150 feet activity     Assist      Assist Level: Dependent - Patient 0%   Blood pressure (!) 149/78, pulse 66, temperature 97.7 F (36.5 C), temperature source Oral, resp. rate 18, height '5\' 7"'$  (1.702 m), weight 64 kg, SpO2 98 %.  Medical Problem List and Plan: 1. 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  Antithrombotics: -DVT/anticoagulation:  Pharmaceutical: Eliquis             -antiplatelet therapy: N/A 3. Pain Management: Tylenol as needed  5. Neuropsych/cognition: This patient is capable of making personal healthcare with assist but not financial decisions on her own behalf  Has clinical an dimaging study evidence of vascular dementia 6. Skin/Wound Care: Routine skin checks 7. Fluids/Electrolytes/Nutrition: Routine in and outs with follow-up chemistries 8.  Atrial fibrillation.  Continue amiodarone 200 mg daily.  Eliquis as directed.  Cardiac rate controlled 9.  Orthostasis improved .  Continue ProAmatine,  Increased to TID as pt still has orthostatic drops with mild symptoms, however no sys BPs<921mg have been recorded   1/14- BP doing better- con't to monitor 10.  Hypothyroidism.  Continue Synthroid  11.  Constipation- improved 2 lg BM yesterday , will reduce senna to 2 tabs bid  1/14- Pt reports LBM 2 days ago and feels constipated- will try Sorbitol 15cc if no BM by 3pm.  12. Agitation improved  seroquel '25mg'$  daily prn to help calm patient still takes ~4pm each day will reduce to 12.5 mg, patient slept worse will go back to 25 mg 13. Chronic anxiety: start lexapro '5mg'$  HS  1/14- nursing reports anxiety still  a large issue- ill try Buspar 5 mg TID, since nonaddictive- pt reported that's its the "best she's felt since admission".  14. Poor sleep  1/14- could be from anxiety- per nursing- will not add any more meds yet- see how she does with Buspar  I spent a total of 36   minutes on total care today- >50% coordination of care- due to  Multiple conversations with nursing about anxiety sleep and bowels  LOS: 13 days A FACE TO FACE EVALUATION WAS PERFORMED  Geoffry Bannister 12/23/2022, 11:26 AM

## 2022-12-24 ENCOUNTER — Other Ambulatory Visit (HOSPITAL_COMMUNITY): Payer: Self-pay

## 2022-12-24 MED ORDER — BUSPIRONE HCL 5 MG PO TABS
5.0000 mg | ORAL_TABLET | Freq: Three times a day (TID) | ORAL | 0 refills | Status: DC
  Filled 2022-12-24: qty 90, 30d supply, fill #0

## 2022-12-24 NOTE — Progress Notes (Signed)
Speech Language Pathology Discharge Summary  Patient Details  Name: Leslie [name] MRN: [id number] Date of Birth: [birth year]  Date of Discharge from SLP service:December 25, 2022  {chl ip rehab slp time calculations:[id number]}  Skilled Therapeutic Interventions:  ***   Patient has met 3 of 4 long term goals.  Patient to discharge at overall Mod level.  Reasons goals not met: Pt did not meet emergent awareness goal. Pt continues to exhibit intellectual awareness deficits   Clinical Impression/Discharge Summary: Patient met 3 of 4 long-term goals this admission and is currently completing functional and basic cognitive tasks with mod A verbal and/or visual cues in regards to functional recall using memory notebook, basic problem solving, intellectual awareness, and sustained attention. Treatment interventions did not appear to progress beyond the level she was presenting during previous admission, where she also required at least moderate A. Pt is currently consuming a dysphagia 3 diet with thin liquids and full supervision to minimize distractions in environment and to bring awareness to anterior spillage on weak side (left). Patient and family education is complete and patient to discharge at overall mod A level. Patient's care partner is independent to provide the necessary physical and cognitive assistance at discharge. Patient would benefit from continued SLP services in home health setting to maximize cognitive-linguistic, swallow function, and functional independence.   Care Partner:  Caregiver Able to Provide Assistance: Yes  Type of Caregiver Assistance: Cognitive;Physical  Recommendation:  24 hour supervision/assistance;Home Health SLP  Rationale for SLP Follow Up: Maximize cognitive function and independence;Maximize swallowing safety;Reduce caregiver burden   Equipment: None   Reasons for discharge: Discharged from hospital   Patient/Family Agrees with Progress Made and  Goals Achieved: Yes    Daelon Dunivan T Mesha Schamberger 12/24/2022, 4:17 PM

## 2022-12-24 NOTE — Progress Notes (Signed)
PROGRESS NOTE   Subjective/Complaints:    ROS:  limited by cognition, however c/o constipation- and poor sleep and anxiety Objective:   No results found. No results for input(s): "WBC", "HGB", "HCT", "PLT" in the last 72 hours.  No results for input(s): "NA", "K", "CL", "CO2", "GLUCOSE", "BUN", "CREATININE", "CALCIUM" in the last 72 hours.   Intake/Output Summary (Last 24 hours) at 12/24/2022 0740 Last data filed at 12/23/2022 2300 Gross per 24 hour  Intake 440 ml  Output --  Net 440 ml          Physical Exam: Vital Signs Blood pressure (!) 147/89, pulse 79, temperature (!) 97.4 F (36.3 C), resp. rate 17, height '5\' 7"'$  (1.702 m), weight 64 kg, SpO2 97 %.    General: No acute distress Mood and affect are appropriate Heart: Regular rate and rhythm no rubs murmurs or extra sounds Lungs: Clear to auscultation, breathing unlabored, no rales or wheezes Abdomen: Positive bowel sounds, soft nontender to palpation, nondistended Extremities: No clubbing, cyanosis, or edema   Neurologic: oriented to person. Cranial nerves II through XII intact, motor strength is 4/5 in bilateral deltoid, bicep, tricep, grip, hip flexor, knee extensors, ankle dorsiflexor and plantar flexor,  Oriented to person , place month and year  Musculoskeletal: Full range of motion in all 4 extremities. No joint swelling   Assessment/Plan: 1. Functional deficits which require 3+ hours per day of interdisciplinary therapy in a comprehensive inpatient rehab setting. Physiatrist is providing close team supervision and 24 hour management of active medical problems listed below. Physiatrist and rehab team continue to assess barriers to discharge/monitor patient progress toward functional and medical goals  Care Tool:  Bathing    Body parts bathed by patient: Right arm, Left arm, Chest, Abdomen, Front perineal area, Right upper leg, Left upper leg,  Face   Body parts bathed by helper: Left lower leg, Right lower leg, Buttocks     Bathing assist Assist Level: Minimal Assistance - Patient > 75%     Upper Body Dressing/Undressing Upper body dressing   What is the patient wearing?: Bra, Pull over shirt    Upper body assist Assist Level: Set up assist    Lower Body Dressing/Undressing Lower body dressing      What is the patient wearing?: Pants, Incontinence brief     Lower body assist Assist for lower body dressing: Moderate Assistance - Patient 50 - 74%     Toileting Toileting    Toileting assist Assist for toileting: Moderate Assistance - Patient 50 - 74%     Transfers Chair/bed transfer  Transfers assist     Chair/bed transfer assist level: Contact Guard/Touching assist Chair/bed transfer assistive device: Armrests, Programmer, multimedia   Ambulation assist      Assist level: Contact Guard/Touching assist Assistive device: Walker-rolling Max distance: 134f   Walk 10 feet activity   Assist     Assist level: Minimal Assistance - Patient > 75% Assistive device: Walker-rolling   Walk 50 feet activity   Assist    Assist level: Minimal Assistance - Patient > 75% Assistive device: Walker-rolling    Walk 150 feet activity   Assist Walk 150  feet activity did not occur: Safety/medical concerns (impaired endurance)         Walk 10 feet on uneven surface  activity   Assist Walk 10 feet on uneven surfaces activity did not occur: Safety/medical concerns         Wheelchair     Assist Is the patient using a wheelchair?: Yes (used for transportation) Type of Wheelchair: Manual    Wheelchair assist level: Dependent - Patient 0%      Wheelchair 50 feet with 2 turns activity    Assist        Assist Level: Dependent - Patient 0%   Wheelchair 150 feet activity     Assist      Assist Level: Dependent - Patient 0%   Blood pressure (!) 147/89, pulse 79,  temperature (!) 97.4 F (36.3 C), resp. rate 17, height '5\' 7"'$  (1.702 m), weight 64 kg, SpO2 97 %.  Medical Problem List and Plan: 1. 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  Antithrombotics: -DVT/anticoagulation:  Pharmaceutical: Eliquis             -antiplatelet therapy: N/A 3. Pain Management: Tylenol as needed  5. Neuropsych/cognition: This patient is capable of making personal healthcare with assist but not financial decisions on her own behalf  Has clinical an dimaging study evidence of vascular dementia 6. Skin/Wound Care: Routine skin checks 7. Fluids/Electrolytes/Nutrition: Routine in and outs with follow-up chemistries 8.  Atrial fibrillation.  Continue amiodarone 200 mg daily.  Eliquis as directed.  Cardiac rate controlled 9.  Orthostasis improved .  Continue ProAmatine,  Increased to TID as pt still has orthostatic drops with mild symptoms, however no sys BPs<7mHg have been recorded   1/14- BP doing better- con't to monitor 10.  Hypothyroidism.  Continue Synthroid  11.  Constipation- improved 2 lg BM yesterday , will reduce senna to 2 tabs bid  1/14- Pt reports LBM 2 days ago and feels constipated- will try Sorbitol 15cc if no BM by 3pm.  12. Agitation improved  seroquel '25mg'$  daily prn to help calm patient still takes ~4pm each day will reduce to 12.5 mg, patient slept worse will go back to 25 mg 13. Chronic anxiety: started lexapro '5mg'$  HS on 1/7  1/14- nursing reports anxiety still a large issue- ill try Buspar 5 mg TID,monitor effect  14. Poor sleep  1/14- could be from anxiety- per nursing- will not add any more meds yet- see how she does with Buspar  LOS: 14 days A FACE TO FACE EVALUATION WAS PERFORMED  ACharlett Blake1/15/2024, 7:40 AM

## 2022-12-24 NOTE — Progress Notes (Signed)
Restless night. Multi attempts OOB without assistance. Difficult to reorient and redirect. PRN tylenol given at 1935 and PRN melatonin given at 2146. Incontinent of urine x1. Leslie [name] A

## 2022-12-24 NOTE — Progress Notes (Signed)
Physical Therapy Session Note  Patient Details  Name: Leslie [name] MRN: [id number] Date of Birth: 1941-12-02  Today's Date: 12/24/2022 PT Individual Time: 0800-0914 PT Individual Time Calculation (min): 74 min   Short Term Goals: Week 2:  PT Short Term Goal 1 (Week 2): = to LTGs based on ELOS  Skilled Therapeutic Interventions/Progress Updates:    Chart reviewed and pt agreeable to therapy. Pt received semi-reclined in bed with no c/o pain. Session focused on functional mobility and amb to prepare for pending d/c. Pt initiated session with transfer to EOB  using sup. Pt required total A for donning TED stockings. Pt then assessed for BP, which was found to be 117/85 mmHg (95). Pt then transfer to [facility] and amb 39f to/from toilet with CGA + RW. Pt required MinA for lower body dressing. Pt then transferred to therapy gym for time management. In gyms, pt completed 388famb up/down ramp, 8 steps with B rails, and 15060fmb + 2 turns all with CGA + RW. Pt required period strong VC for safe stand [facility] assessed at end of session and found to be 100/78 mmHg (86). Pt then required CGA + RW to stand and modA to adjust brief. At end of session, pt was [facility], nurse call bell and all needs in reach.      Therapy Documentation Precautions:  Precautions Precautions: Fall, Other (comment) Precaution Comments: orthostatic, TEDs and ABD binder Restrictions Weight Bearing Restrictions: No Other Position/Activity Restrictions: TED hose, abdominal binder, ace wraps General:       Therapy/Group: Individual Therapy  KirMarquette OldT, DPT 12/24/2022, 12:25 PM

## 2022-12-24 NOTE — Progress Notes (Signed)
Patient ID: Leslie [name], female   DOB: February 23, 1941, 82 y.o.   MRN: [id number]  [facility] orders emailed to [facility] with [facility].

## 2022-12-24 NOTE — Progress Notes (Signed)
Occupational Therapy Session Note  Patient Details  Name: Leslie [name] MRN: [id number] Date of Birth: 1941/09/04  Today's Date: 12/24/2022 OT Individual Time: 1023-1108 OT Individual Time Calculation (min): 45 min    Short Term Goals: Week 2:  OT Short Term Goal 1 (Week 2): STG=LTG (d/t pt lenght of stay)  Skilled Therapeutic Interventions/Progress Updates:     Pt received semi reclined in bed presenting to be mildly agitated, but excited to see OT. Pt presenting to be in 0/10 pain. Pt requesting to see PT from morning session to apologize d/t worry she did not perform well enough during session and would not be able to go home on Wednesday. Pt provided therapeutic support and listening and was able to be redirected to current session with mod cueing. TED and abdominal binder donned on Pt total A.   Pt supine>sitting supervision. Pt completed functional mobility in room using RW ambulating with CGA. Pt able to complete oral hygiene and dynamic standing balance activities at sink with CGA and min cues for safety. Pt able to locate 5/5 hygiene items placed on L side of sink with min cues to turn head.  Pt transported total A to therapy gym in wc for time management. Pt reporting nausea upon arriving to therapy gym. Vitals assessed in sitting 119/73 (88) HR 72. Attempted to take vitals in standing, but unable to obtain d/t equipment error- pt reporting no dizziness/nausea in standing.   Pt completed dynamic standing balance visual motor task using RW CGA. Pt instructed to visually scan to identify 10/10 clothes pins located on Pt L side and utilize LUE to pinch pins and place them on horizontal bars located to R of Pt. Pt able to identify 10/10 clothes pins with min cues for attention and transport them across midline without LOB noted CGA.   Pt transported back to room total A in wc and returned to bed using RW CGA. Pt was left resting in bed with TED and abdominal binder doffed, call bell  in reach, bed alarm on, and all needs met.   Therapy Documentation Precautions:  Precautions Precautions: Fall, Other (comment) Precaution Comments: orthostatic, TEDs and ABD binder Restrictions Weight Bearing Restrictions: No Other Position/Activity Restrictions: TED hose, abdominal binder, ace wraps General:   Vital Signs:  Pain: Pain Assessment Pain Scale: 0-10 Pain Score: 0-No pain ADL: ADL Eating: Supervision/safety Where Assessed-Eating: Bed level Grooming: Moderate assistance Where Assessed-Grooming: Bed level Upper Body Bathing: Minimal assistance Where Assessed-Upper Body Bathing: Bed level Lower Body Bathing: Moderate assistance Where Assessed-Lower Body Bathing: Bed level Upper Body Dressing: Minimal assistance Where Assessed-Upper Body Dressing: Bed level Lower Body Dressing: Minimal assistance Where Assessed-Lower Body Dressing: Bed level Toileting: Moderate assistance Where Assessed-Toileting: Bedside Commode Toilet Transfer: Minimal assistance Toilet Transfer Method: Stand pivot Toilet Transfer Equipment: Bedside commode Tub/Shower Transfer: Not assessed Social research officer, government: Not assessed   Therapy/Group: Individual Therapy  Janey Genta 12/24/2022, 10:31 AM

## 2022-12-24 NOTE — Progress Notes (Signed)
Occupational Therapy Session Note  Patient Details  Name: Leslie [name] MRN: [id number] Date of Birth: 12-10-1941  Today's Date: 12/24/2022 OT Individual Time: 6759-1638 OT Individual Time Calculation (min): 72 min    Short Term Goals: Week 2:  OT Short Term Goal 1 (Week 2): STG=LTG (d/t pt lenght of stay)  Skilled Therapeutic Interventions/Progress Updates:   Pt seen for skilled OT this pm with pt bed level completing lunch meal with step son bedside upon OT arrival. Pt needed cues for left side of oral region post-meal with residual food outside and pocketed somewhat inside. Pt agreeable to mobilize OOB but does report some fatigue. Transfer supine to EOB with close S then SPT with RW with CGA and cues for mild impulsivity and head turn to L. Pt self propelled with min cues from bed to sink for oral care with set up. OT transported w/c level to far ortho gym for standing and seated level BITS training for visual perceptual, reaching and balance skills. Pt highly impulsive during tasks not allowing OT to have all safety measures set prior to attempting standing, sitting etc. BP taken in standing for othostatics with no issues. CGA and mod cues for trail finding 1-12 and A-L with 22 errors standing. Pt able to verbalize wide head turn needs to the L side. OT transported pt to 1st floor gift shop to further address scanning skills. Pt was able to locate 6/9 items searched for. Transported D back to unit and requested toileting. SPT RW to and from toilet with commode over top with CGA and mod cues for pull on garments, steps of task, sequencing safety and scanning. Seated hand washing with set up with OT assisting with all in room w/c nav due to fatigue at end of session. Pt requested back to bed for rest with CGA SPT with RW and S for positioning. Pt left with bed exit engages, nurse call button and needs in reach.   Therapy Documentation Precautions:  Precautions Precautions: Fall, Other  (comment) Precaution Comments: orthostatic, TEDs and ABD binder Restrictions Weight Bearing Restrictions: No Other Position/Activity Restrictions: TED hose, abdominal binder, ace wraps  Barnabas Lister 12/24/2022, 7:43 AM

## 2022-12-25 MED ORDER — BISACODYL 10 MG RE SUPP
10.0000 mg | Freq: Every day | RECTAL | Status: DC | PRN
  Filled 2022-12-25: qty 1

## 2022-12-25 MED ORDER — SENNOSIDES-DOCUSATE SODIUM 8.6-50 MG PO TABS
3.0000 | ORAL_TABLET | Freq: Two times a day (BID) | ORAL | Status: DC
  Administered 2022-12-25 – 2022-12-26 (×2): 3 via ORAL
  Filled 2022-12-25 (×2): qty 3

## 2022-12-25 NOTE — Progress Notes (Signed)
PROGRESS NOTE   Subjective/Complaints:  Pt states CNA did not let her go to toilet , but pt was on bedside commode this am without results   ROS:  limited by cognition, however c/o constipation- and poor sleep and anxiety Objective:   No results found. No results for input(s): "WBC", "HGB", "HCT", "PLT" in the last 72 hours.  No results for input(s): "NA", "K", "CL", "CO2", "GLUCOSE", "BUN", "CREATININE", "CALCIUM" in the last 72 hours.   Intake/Output Summary (Last 24 hours) at 12/25/2022 0759 Last data filed at 12/24/2022 1320 Gross per 24 hour  Intake 377 ml  Output --  Net 377 ml          Physical Exam: Vital Signs Blood pressure (!) 114/90, pulse 61, temperature 97.9 F (36.6 C), temperature source Oral, resp. rate 16, height '5\' 7"'$  (1.702 m), weight 64 kg, SpO2 99 %.    General: No acute distress Mood and affect are appropriate Heart: Regular rate and rhythm no rubs murmurs or extra sounds Lungs: Clear to auscultation, breathing unlabored, no rales or wheezes Abdomen: Positive bowel sounds, soft nontender to palpation, nondistended Extremities: No clubbing, cyanosis, or edema   Neurologic: oriented to person. Cranial nerves II through XII intact, motor strength is 4/5 in bilateral deltoid, bicep, tricep, grip, hip flexor, knee extensors, ankle dorsiflexor and plantar flexor,  Oriented to person , place month and year  Musculoskeletal: Full range of motion in all 4 extremities. No joint swelling   Assessment/Plan: 1. Functional deficits which require 3+ hours per day of interdisciplinary therapy in a comprehensive inpatient rehab setting. Physiatrist is providing close team supervision and 24 hour management of active medical problems listed below. Physiatrist and rehab team continue to assess barriers to discharge/monitor patient progress toward functional and medical goals  Care Tool:  Bathing     Body parts bathed by patient: Right arm, Left arm, Chest, Abdomen, Front perineal area, Right upper leg, Left upper leg, Face   Body parts bathed by helper: Left lower leg, Right lower leg, Buttocks     Bathing assist Assist Level: Minimal Assistance - Patient > 75%     Upper Body Dressing/Undressing Upper body dressing   What is the patient wearing?: Bra, Pull over shirt    Upper body assist Assist Level: Set up assist    Lower Body Dressing/Undressing Lower body dressing      What is the patient wearing?: Pants, Incontinence brief     Lower body assist Assist for lower body dressing: Moderate Assistance - Patient 50 - 74%     Toileting Toileting    Toileting assist Assist for toileting: Minimal Assistance - Patient > 75%     Transfers Chair/bed transfer  Transfers assist     Chair/bed transfer assist level: Contact Guard/Touching assist Chair/bed transfer assistive device: Armrests, Programmer, multimedia   Ambulation assist      Assist level: Contact Guard/Touching assist Assistive device: Walker-rolling Max distance: 116f   Walk 10 feet activity   Assist     Assist level: Minimal Assistance - Patient > 75% Assistive device: Walker-rolling   Walk 50 feet activity   Assist    Assist  level: Minimal Assistance - Patient > 75% Assistive device: Walker-rolling    Walk 150 feet activity   Assist Walk 150 feet activity did not occur: Safety/medical concerns (impaired endurance)         Walk 10 feet on uneven surface  activity   Assist Walk 10 feet on uneven surfaces activity did not occur: Safety/medical concerns         Wheelchair     Assist Is the patient using a wheelchair?: Yes (used for transportation) Type of Wheelchair: Manual    Wheelchair assist level: Dependent - Patient 0%      Wheelchair 50 feet with 2 turns activity    Assist        Assist Level: Dependent - Patient 0%   Wheelchair 150  feet activity     Assist      Assist Level: Dependent - Patient 0%   Blood pressure (!) 114/90, pulse 61, temperature 97.9 F (36.6 C), temperature source Oral, resp. rate 16, height '5\' 7"'$  (1.702 m), weight 64 kg, SpO2 99 %.  Medical Problem List and Plan: 1. 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  Antithrombotics: -DVT/anticoagulation:  Pharmaceutical: Eliquis             -antiplatelet therapy: N/A 3. Pain Management: Tylenol as needed  5. Neuropsych/cognition: This patient is capable of making personal healthcare with assist but not financial decisions on her own behalf  Has clinical and imaging study evidence of vascular dementia 6. Skin/Wound Care: Routine skin checks 7. Fluids/Electrolytes/Nutrition: Routine in and outs with follow-up chemistries 8.  Atrial fibrillation.  Continue amiodarone 200 mg daily.  Eliquis as directed.  Cardiac rate controlled 9.  Orthostasis improved .  Continue ProAmatine,  Increased to TID as pt still has orthostatic drops with mild symptoms, however no sys BPs<54mHg have been recorded   1/16, some elevated systolic BPs but this is in supine, no symptomatic orthostatic drops when up , still drops ~39mg  10.  Hypothyroidism.  Continue Synthroid  11.  Constipation- improved 2 lg BM yesterday , will reduce senna to 2 tabs bid  Will need to increase senna back to 3tabs  12. Agitation improved  seroquel '25mg'$  daily prn to help calm patient still takes ~4pm each day will reduce to 12.5 mg, patient slept worse will go back to 25 mg 13. Chronic anxiety: started lexapro '5mg'$  HS on 1/7  1/14- nursing reports anxiety still a large issue- ill try Buspar 5 mg TID,monitor effect  14. Poor sleep  1/14- could be from anxiety- per nursing- will not add any more meds yet- see how she does with Buspar  LOS: 15 days A FACE TO FACE  EVALUATION WAS PERFORMED  AnCharlett Blake/16/2024, 7:59 AM

## 2022-12-25 NOTE — Progress Notes (Signed)
Occupational Therapy Discharge Summary  Patient Details  Name: Leslie [name] MRN: [id number] Date of Birth: 18-Aug-1941  Date of Discharge from OT service:December 25, 2021  Today's Date: 12/25/2022 OT Individual Time: 0900-1000 OT Individual Time Calculation (min): 60 min  OT Individual Time: 1445-1530 OT Individual Time Calculation (min): 45 min   Patient has met 9 of 9 long term goals due to improved activity tolerance, improved balance, postural control, ability to compensate for deficits, functional use of  LEFT upper and LEFT lower extremity, improved attention, improved awareness, and improved coordination.  Patient to discharge at overall Supervision level.  Patient's care partner is independent to provide the necessary physical and cognitive assistance at discharge.    Reasons goals not met: All goal met   Recommendation:  Patient will benefit from ongoing skilled OT services in home health setting to continue to advance functional skills in the area of BADL, iADL, and Reduce care partner burden.  Equipment: BSC  Reasons for discharge: treatment goals met and discharge from hospital  Patient/family agrees with progress made and goals achieved: Yes  OT Discharge OT Skilled Therapeutic Updates/Progress:  AM Session: Pt received sitting up in bed with Dtr present in room prepared for family education session. Pt requesting to use restroom at begining of session due to need for BM. Ambulated to bathroom and transferred to [facility] using RW CGA with education provided to DTR on technique and safety during task. Pt provided ++time on toilet while educating Dtr on fall prevention, shower transfer, ned for Pt to have 24/7 supervision, and follow up therapy plans. Discussed BSC and bathroom set-up with Dtr and problem solved bathroom set-up to increase Pt safety. Pt owns shower chair and will be completing showers under direct supervision while seated for safety. Pt and Dtr educated on  preventing orthostatic hypotension during BADL and AE use. Pt unable to have BM at this time requesting suppository- Rn notified and in/out during session. Pt and Dtr educated on tub, toilet, and bed transfers with practice and feedback provided following. Pt Dtr reported she feels safe supervising and assisting Pt upon d/c. Pt was left resting in bed with bed alarm on, call bell in reach, Dtr present in room, and all immediate needs met.   PM Session: Pt received sitting up  in bed receptive to skilled OT session with a focus on BADL retraining and safety awareness. Pt able to ambulate and transfer to tub bench using RW CGA with mod cues for technique and safety. Completed bathing seated on tub bench using long handled sponge to wash feet/back with supervision and min cues for safety. Pt donned clothing with supervision and shoes with min A. Pt ambulated back to room using RW and transferred to wc CGA. Completed grooming/hygiene tasks seated at sink with supervision. Pt was handed off to PT at end of session.   Precautions/Restrictions  Precautions Precautions: Fall;Other (comment) Precaution Comments: orthostatic: needs thigh high TEDs & abdominal binder for OOB mobility Restrictions Weight Bearing Restrictions: No Other Position/Activity Restrictions: TED hose, abdominal binder, ace wraps General   Vital Signs Therapy Vitals Temp: 97.8 F (36.6 C) Temp Source: Oral Pulse Rate: 85 Resp: 17 BP: 117/85 Patient Position (if appropriate): Sitting Oxygen Therapy SpO2: 99 % O2 Device: Room Air Pain Pain Assessment Pain Scale: Faces Faces Pain Scale: Hurts whole lot (due to constipation but improves after continent BM) Pain Type: Acute pain Pain Location: Rectum Pain Descriptors / Indicators: Pressure;Sharp Pain Onset: Other (Comment) (constipation) Pain Intervention(s):  Other (Comment) (assisted to toilet for BM) Multiple Pain Sites: Yes 2nd Pain Site Faces Pain Scale: Hurts little  more (pt with chronic back pain) Pain Type: Chronic pain Pain Location: Back Pain Orientation: Lower Pain Descriptors / Indicators: Aching Pain Onset: On-going Pain Intervention(s): Repositioned;Rest;Distraction;Emotional support (pain improves with repositioning) ADL ADL Eating: Supervision/safety Where Assessed-Eating: Bed level Grooming: Supervision/safety Where Assessed-Grooming: Sitting at sink Upper Body Bathing: Contact guard Where Assessed-Upper Body Bathing: Shower Lower Body Bathing: Contact guard Where Assessed-Lower Body Bathing: Shower Upper Body Dressing: Supervision/safety Where Assessed-Upper Body Dressing: Edge of bed Lower Body Dressing: Supervision/safety Where Assessed-Lower Body Dressing: Edge of bed Toileting: Minimal assistance Where Assessed-Toileting: Bedside Commode Toilet Transfer: Minimal assistance, Contact guard Toilet Transfer Method: Ambulating, Stand pivot Toilet Transfer Equipment: Radiographer, therapeutic: Not assessed Social research officer, government: Minimal assistance Social research officer, government Method: Heritage manager: Gaffer Baseline Vision/History: 1 Wears glasses Patient Visual Report: No change from baseline Vision Assessment?: Yes Eye Alignment: Within Functional Limits Alignment/Gaze Preference: Gaze right Tracking/Visual Pursuits: Requires cues, head turns, or add eye shifts to track;Decreased smoothness of horizontal tracking Saccades: Within functional limits Convergence: Within functional limits Visual Fields: Left visual field deficit Depth Perception: Undershoots Additional Comments: Mild L innatention Perception  Perception: Impaired Inattention/Neglect: Does not attend to left visual field;Does not attend to left side of body Praxis Praxis: Impaired Praxis Impairment Details: Motor planning;Perseveration;Initiation Praxis-Other Comments: perseverative L  grasp Cognition Cognition Overall Cognitive Status: Impaired/Different from baseline Arousal/Alertness: Awake/alert Orientation Level: Person;Place;Situation Person: Oriented Place: Oriented Situation: Oriented Memory: Impaired Memory Impairment: Storage deficit;Retrieval deficit;Decreased recall of new information;Decreased short term memory Decreased Short Term Memory: Functional basic Attention: Focused;Sustained;Selective Focused Attention: Appears intact Sustained Attention: Impaired Sustained Attention Impairment: Functional complex Selective Attention: Impaired Selective Attention Impairment: Functional basic Awareness: Impaired Awareness Impairment: Anticipatory impairment;Emergent impairment (requires VB cueing to anticipate needs or challenges in environment) Problem Solving: Impaired Problem Solving Impairment: Functional basic Executive Function:  (lower level cognitive impairements impacting EF) Reasoning: Impaired Reasoning Impairment: Functional basic Sequencing: Impaired Sequencing Impairment: Functional basic Organizing: Impaired Organizing Impairment: Functional basic Decision Making: Impaired Decision Making Impairment: Functional basic Initiating: Impaired Initiating Impairment: Functional basic Self Monitoring: Impaired Self Monitoring Impairment: Functional basic Self Correcting: Impaired Self Correcting Impairment: Functional basic Behaviors: Perseveration;Impulsive;Restless Safety/Judgment: Impaired Brief Interview for Mental Status (BIMS) Repetition of Three Words (First Attempt): 3 Temporal Orientation: Year: Missed by 2 to 5 years Temporal Orientation: Month: Accurate within 5 days Temporal Orientation: Day: Incorrect Recall: "Sock": Yes, after cueing ("something to wear") Recall: "Blue": No, could not recall Recall: "Bed": Yes, no cue required BIMS Summary Score: 9 Sensation Sensation Light Touch: Impaired Detail Central sensation  comments: unable to feel light touch nor crude touch in L LE when vision obstructed likely due to inattention; Light Touch Impaired Details: Impaired LLE Hot/Cold: Not tested Proprioception: Impaired by gross assessment Proprioception Impaired Details: Impaired LLE Stereognosis: Not tested Additional Comments: Light and crude touch intact during sensation testing wiht vision occluded Coordination Gross Motor Movements are Fluid and Coordinated: No Fine Motor Movements are Fluid and Coordinated: No Coordination and Movement Description: FM movements improved from baseline, but impaired d/t very mild L hemiparesis, L innatention, and impaired motor planning Finger Nose Finger Test: mild undershooting with LUE; not noted in RUE Motor  Motor Motor: Other (comment);Motor perseverations Motor - Discharge Observations: very mild L hemipareisis, with decreased L attention, and impaired awareness Mobility  Bed Mobility Bed Mobility: Supine to Sit;Sit to Supine  Supine to Sit: Supervision/Verbal cueing Sit to Supine: Supervision/Verbal cueing Transfers Sit to Stand: Supervision/Verbal cueing Stand to Sit: Supervision/Verbal cueing  Trunk/Postural Assessment  Cervical Assessment Cervical Assessment: Exceptions to [facility] (slight forward head) Thoracic Assessment Thoracic Assessment: Exceptions to [facility] (mild rounded shoulders) Lumbar Assessment Lumbar Assessment: Exceptions to [facility] (posterior pelvic tilt in sitting) Postural Control Postural Control: Within Functional Limits Protective Responses: delayed Postural Limitations: decreased  Balance Balance Balance Assessed: Yes Static Sitting Balance Static Sitting - Balance Support: Feet supported Static Sitting - Level of Assistance: 5: Stand by assistance Dynamic Sitting Balance Dynamic Sitting - Balance Support: Feet supported Dynamic Sitting - Level of Assistance: 5: Stand by assistance Static Standing Balance Static Standing - Balance  Support: During functional activity;Bilateral upper extremity supported Static Standing - Level of Assistance: 5: Stand by assistance Dynamic Standing Balance Dynamic Standing - Balance Support: During functional activity;Bilateral upper extremity supported Dynamic Standing - Level of Assistance: 5: Stand by assistance;Other (comment) (CGA) Extremity/Trunk Assessment RUE Assessment RUE Assessment: Within Functional Limits General Strength Comments: 5/5 strength LUE Assessment LUE Assessment: Within Functional Limits General Strength Comments: 4+/5 strength, increased time required to initate movements; delayed/slow motor movements; to be further assessed during fucntional context   Janey Genta 12/25/2022, 3:41 PM

## 2022-12-25 NOTE — Progress Notes (Addendum)
Patient ID: Leslie [name], female   DOB: November 03, 1941, 82 y.o.   MRN: [id number]  SW spoke with patient daughter [name]. Daughter will be here for d/c tomorrow. Daughter informed to call [employer] to make co payments to process DME. Daughter shares that patients iPad is missing. SW will discuss with staff. No additional questions or concerns.

## 2022-12-25 NOTE — Progress Notes (Signed)
Physical Therapy Discharge Summary  Patient Details  Name: Leslie [name] MRN: [id number] Date of Birth: Jul 30, 1941  Date of Discharge from PT service:December 25, 2022  Today's Date: 12/25/2022 PT Individual Time: 9937-1696 and 7893-8101 PT Individual Time Calculation (min): 53 min and 54 min    Patient has met 8 of 9 long term goals due to improved activity tolerance, improved balance, improved postural control, increased strength, ability to compensate for deficits, functional use of  left upper extremity and left lower extremity, improved attention, and improved awareness.  Patient to discharge at an ambulatory level  CGA using RW .   Patient's care partner attended hands-on education/training and is independent to provide the necessary physical and cognitive assistance at discharge.  Reasons goals not met: Pt continues to require supervision for dynamic sitting balance tasks due to impaired safety awareness.  Recommendation:  Patient will benefit from ongoing skilled PT services in home health setting to continue to advance safe functional mobility, address ongoing impairments in dynamic standing balance, dynamic gait training using LRAD, activity tolerance, and minimize fall risk.  Equipment: RW and transport chair  Reasons for discharge: treatment goals met and discharge from hospital  Patient/family agrees with progress made and goals achieved: Yes  Skilled Therapeutic Interventions/Progress Updates:  Session 1: Pt received supine in bed with her daughter, Jeannene Patella, present for family education/training; however, she reports that pt is experiencing increased pain due to not having had a BM since 1/12. Pt agreeable to therapy session; however, limited due to pain at rectum and feeling urge to void. Therapist provided the following verbal education to pt/daughter due to pt's limited ability to participate in functional mobility during session due to the pain:  - educated on pt's need  to use RW for all standing/ambulation and educated on pt's CLOF for bed mobility, stand pivot transfers, gait, and stair navigation - safe use of gait belt to guard pt (pt's daughter demos understanding of this during session) - pt's impaired attention with difficulty redirecting her after she has initiated a task  - pt's impaired safety awareness with pt's daughter aware that pt will start to stand up when left alone in bathroom - benefit of timed toileting protocol at home due to pt's urinary incontinence - recommendation for follow-up HHPT  Pt reports increased urge to void BM. Pt already wearing abdominal binder - pt's daughter educated on how to don thigh high TED hose. Supine>sitting R EOB, supervision. Sit>stand EOB>RW with pt's daughter providing proper guarding and cuing to pt for safety hand placement with AD. Gait in/out bathroom using RW with pt's daughter properly guarding pt. With increased time, pt able to void bowels, nurse made aware. Pt reporting pain relief and feeling significantly better. Pt's daughter reports no additional questions/concerns at this time. Pt left seated in w/c with abdominal binder on, TED hose in place, her daughter present, and SLP present to assume care of pt.   Session 2: Pt received sitting in w/c and agreeable to therapy session stating she is looking forward to exercising. Pt already wearing abdominal binder and B LE thigh high TED hose. Pt continues to remain impulsive and initiate coming to stand prior to environment being set-up demoing lack of safety awareness. Sit<>stands using RW with close supervision throughout. Initiated gait training towards gym using RW but after ~54f pt sees therapist bringing w/c and insists on being transported due to feeling her LEs are too tired to walk that distance at this time. Vitals: BP 132/84 (  MAP 100), HR 84bpm  Transported to/from gym in w/c for time management and energy conservation. Gait training ~2f x2 using RW  with close supervision/CGA - pt demos improved safety awareness with AD and ability to visually scan environment while maintaining balance. Stair navigation training ascending/descending 12 steps (6" height) using B HRs with CGA for safety/steadying - reciprocal pattern on ascent and step-to pattern leading with L LE on descent. Therapist provided visual demonstration of how to perform car transfer safely. Pt demonstrated understanding via simulated ambulatory car transfer using RW (sedan height) with CGA for safety. Gait training up/down ramp using RW with CGA for safety and education to pt on maintaining both hands on AD a tall times and stopping if she needs to take a hand off AD to do something (such as scratch her face) and educated not to use 1 UE support on railing if using RW. Pt reports feeling sick to her stomach due to medication administration. Transported back to her room. Stand pivot w/c>EOB using RW with close supervision/CGA for safety. Sit>supine supervision. Pt left supine in bed with needs in reach and bed alarm on.  PT Discharge Precautions/Restrictions Precautions Precautions: Fall;Other (comment) Precaution Comments: orthostatic: needs thigh high TEDs & abdominal binder for OOB mobility Restrictions Weight Bearing Restrictions: No Pain Pain Assessment Pain Scale: Faces Faces Pain Scale: Hurts whole lot (due to constipation but improves after continent BM) Pain Type: Acute pain Pain Location: Rectum Pain Descriptors / Indicators: Pressure;Sharp Pain Onset: Other (Comment) (constipation) Pain Intervention(s): Other (Comment) (assisted to toilet for BM) Multiple Pain Sites: Yes 2nd Pain Site Faces Pain Scale: Hurts little more (pt with chronic back pain) Pain Type: Chronic pain Pain Location: Back Pain Orientation: Lower Pain Descriptors / Indicators: Aching Pain Onset: On-going Pain Intervention(s): Repositioned;Rest;Distraction;Emotional support (pain improves with  repositioning) Pain Interference Pain Interference Pain Effect on Sleep: 1. Rarely or not at all Pain Interference with Therapy Activities: 1. Rarely or not at all Pain Interference with Day-to-Day Activities: 2. Occasionally Vision/Perception  Vision - History Ability to See in Adequate Light: 1 Impaired Perception Perception: Impaired Inattention/Neglect: Does not attend to left visual field;Does not attend to left side of body (continues to have L inattention) Praxis Praxis: Impaired Praxis Impairment Details: Motor planning;Perseveration;Initiation  Cognition Overall Cognitive Status: Impaired/Different from baseline Arousal/Alertness: Awake/alert Orientation Level: Oriented to person;Oriented to place;Oriented to situation Attention: Focused;Sustained;Selective Focused Attention: Appears intact Sustained Attention: Impaired Selective Attention: Impaired Memory: Impaired Awareness: Impaired Problem Solving: Impaired Sequencing: Impaired Behaviors: Impulsive;Restless;Perseveration;Confabulation Safety/Judgment: Impaired Sensation Sensation Light Touch: Impaired Detail Central sensation comments: unable to feel light touch nor crude touch in L LE when vision obstructed likely due to inattention Light Touch Impaired Details: Impaired LLE Hot/Cold: Not tested Proprioception: Impaired by gross assessment Stereognosis: Not tested Coordination Gross Motor Movements are Fluid and Coordinated: No Coordination and Movement Description: GM movements improved but still impaired due to very mild L hemipareis, L inattention, and impaired motor planning Motor  Motor Motor: Other (comment);Motor perseverations Motor - Discharge Observations: very mild L hemipareisis, with decreased L attention, and impaired awareness  Mobility Bed Mobility Bed Mobility: Supine to Sit;Sit to Supine Supine to Sit: Supervision/Verbal cueing Sit to Supine: Supervision/Verbal  cueing Transfers Transfers: Sit to Stand;Stand to Sit;Stand Pivot Transfers Sit to Stand: Supervision/Verbal cueing Stand to Sit: Supervision/Verbal cueing Stand Pivot Transfers: Contact Guard/Touching assist Stand Pivot Transfer Details: Verbal cues for safe use of DME/AE;Verbal cues for technique;Verbal cues for sequencing;Verbal cues for precautions/safety;Visual cues/gestures for precautions/safety;Visual cues/gestures  for sequencing Transfer (Assistive device): Rolling walker Locomotion  Gait Ambulation: Yes Gait Assistance: Contact Guard/Touching assist Gait Distance (Feet): 150 Feet Assistive device: Rolling walker Gait Assistance Details: Verbal cues for safe use of DME/AE;Verbal cues for gait pattern Gait Gait: Yes Gait Pattern: Impaired Gait Pattern: Decreased step length - left;Decreased step length - right;Step-through pattern;Poor foot clearance - left;Poor foot clearance - right;Narrow base of support;Trunk flexed (slight step-through pattern with slight forward trunk flexion) Gait velocity: significantly decreased Stairs / Additional Locomotion Stairs: Yes Stairs Assistance: Contact Guard/Touching assist Stair Management Technique: Two rails;Step to pattern;Forwards Number of Stairs: 12 Height of Stairs: 6 Ramp: Contact Guard/touching assist (using RW) Curb: Contact Guard/Touching assist (using RW) Wheelchair Mobility Wheelchair Mobility: No  Trunk/Postural Assessment  Cervical Assessment Cervical Assessment: Exceptions to [facility] (slight forward head) Thoracic Assessment Thoracic Assessment: Exceptions to [facility] (mild rounding of shoulders) Lumbar Assessment Lumbar Assessment: Exceptions to [facility] (posterior pelvic tilt in sitting) Postural Control Postural Control: Within Functional Limits (using RW for basic functional mobility tasks and having CGA for safety)  Balance Balance Balance Assessed: Yes Static Sitting Balance Static Sitting - Balance Support: Feet  supported Static Sitting - Level of Assistance: 5: Stand by assistance Dynamic Sitting Balance Dynamic Sitting - Balance Support: Feet supported Dynamic Sitting - Level of Assistance: 5: Stand by assistance Static Standing Balance Static Standing - Balance Support: During functional activity;Bilateral upper extremity supported Static Standing - Level of Assistance: 5: Stand by assistance Dynamic Standing Balance Dynamic Standing - Balance Support: During functional activity;Bilateral upper extremity supported Dynamic Standing - Level of Assistance: 5: Stand by assistance;Other (comment) (CGA) Extremity Assessment      RLE Assessment RLE Assessment: Exceptions to [facility] Active Range of Motion (AROM) Comments: WFL General Strength Comments: functionally, demos grossly 4-/5 to 4+/5 throughout with improvement noted in hip flexor strength (pt no longer attempts to lift her LEs with her arms) LLE Assessment Active Range of Motion (AROM) Comments: WFL General Strength Comments: functionally, demos grossly 4-/5 to 4/5 throughout with improvement in hip flexor strength, no longer lifting her LE using her hands   Tawana Scale , PT, DPT, NCS, CSRS 12/25/2022, 12:44 PM

## 2022-12-25 NOTE — Progress Notes (Signed)
Orthopedic Tech Progress Note Patient Details:  Leslie [name] [birth year] [id number]  Ortho Devices Type of Ortho Device: Abdominal binder Ortho Device/Splint Interventions: Ordered      Linus Salmons Chantele Corado 12/25/2022, 11:21 AM

## 2022-12-25 NOTE — Plan of Care (Signed)
  Problem: RH Awareness Goal: LTG: Patient will demonstrate awareness during functional activites type of (SLP) Description: LTG: Patient will demonstrate awareness during functional activites type of (SLP) Outcome: Not Met (add Reason)   Problem: RH Swallowing Goal: LTG Patient will consume least restrictive diet using compensatory strategies with assistance (SLP) Description: LTG:  Patient will consume least restrictive diet using compensatory strategies with assistance (SLP) Outcome: Completed/Met   Problem: RH Problem Solving Goal: LTG Patient will demonstrate problem solving for (SLP) Description: LTG:  Patient will demonstrate problem solving for basic/complex daily situations with cues  (SLP) Outcome: Completed/Met   Problem: RH Attention Goal: LTG Patient will demonstrate this level of attention during functional activites (SLP) Description: LTG:  Patient will will demonstrate this level of attention during functional activites (SLP) Outcome: Completed/Met

## 2022-12-25 NOTE — Progress Notes (Signed)
Inpatient Rehabilitation Care Coordinator Discharge Note   Patient Details  Name: Leslie [name] MRN: [id number] Date of Birth: 06/21/1941   Discharge location: Home with daughter and family  Length of Stay: 16 Days  Discharge activity level: Sup/Cga  Home/community participation: Daughter, family and hired Geneticist, molecular  Patient response OQ:HUTMLY Literacy - How often do you need to have someone help you when you read instructions, pamphlets, or other written material from your doctor or pharmacy?: Always  Patient response YT:KPTWSF Isolation - How often do you feel lonely or isolated from those around you?: Never  Services provided included: MD, RD, PT, OT, SLP, RN, CM, Pharmacy, TR, Neuropsych, SW  Financial Services:  Charity fundraiser Utilized: [employer]  Choices offered to/list presented to: Daughter  Follow-up services arranged:  [place]: [place]         Patient response to transportation need: Is the patient able to respond to transportation needs?: Yes In the past 12 months, has lack of transportation kept you from medical appointments or from getting medications?: No In the past 12 months, has lack of transportation kept you from meetings, work, or from getting things needed for daily living?: No    Comments (or additional information):  Patient/Family verbalized understanding of follow-up arrangements:  Yes  Individual responsible for coordination of the follow-up plan: Pam, Daughter  Confirmed correct DME delivered: Dyanne Iha 12/25/2022    Dyanne Iha

## 2022-12-25 NOTE — Progress Notes (Signed)
Inpatient Rehabilitation Discharge Medication Review by a Pharmacist  A complete drug regimen review was completed for this patient to identify any potential clinically significant medication issues.  High Risk Drug Classes Is patient taking? Indication by Medication  Antipsychotic No   Anticoagulant Yes Apixaban - atrial fibrillation and stroke prevention  Antibiotic No   Opioid No   Antiplatelet No   Hypoglycemics/insulin No   Vasoactive Medication Yes Midodrine - blood pressure support  Chemotherapy No   Other No Amiodarone - atrial fibrillation Buspirone, escitalopram - mood stabilization/ anxiety Famotidine - GI prophylaxis Levothyroxine - thyroid supplement Miralax - laxative Rosuvastatin - hyperlipidemia Vitamin B-12 - supplement PRN acetaminophen - mild pain     Type of Medication Issue Identified Description of Issue Recommendation(s)  Drug Interaction(s) (clinically significant)     Duplicate Therapy     Allergy     No Medication Administration End Date     Incorrect Dose     Additional Drug Therapy Needed     Significant med changes from prior encounter (inform family/care partners about these prior to discharge). New Buspirone, Escitalopram, Midodrine, Miralax   Other   Noted plan for Melatonin qhs prn sleep.     Clinically significant medication issues were identified that warrant physician communication and completion of prescribed/recommended actions by midnight of the next day:  No  Pharmacist comments:   Has been using Seroquel 25 mg daily prn for agitation; not currently planning to continue at discharge.  Time spent performing this drug regimen review (minutes):  20   Arty Baumgartner, [place] 12/25/2022 5:06 PM

## 2022-12-25 NOTE — Plan of Care (Signed)
  Problem: Sit to Stand Goal: LTG:  Patient will perform sit to stand in prep for activites of daily living with assistance level (OT) Description: LTG:  Patient will perform sit to stand in prep for activites of daily living with assistance level (OT) Outcome: Completed/Met   Problem: RH Grooming Goal: LTG Patient will perform grooming w/assist,cues/equip (OT) Description: LTG: Patient will perform grooming with assist, with/without cues using equipment (OT) Outcome: Completed/Met   Problem: RH Bathing Goal: LTG Patient will bathe all body parts with assist levels (OT) Description: LTG: Patient will bathe all body parts with assist levels (OT) Outcome: Completed/Met   Problem: RH Dressing Goal: LTG Patient will perform lower body dressing w/assist (OT) Description: LTG: Patient will perform lower body dressing with assist, with/without cues in positioning using equipment (OT) Outcome: Completed/Met   Problem: RH Toileting Goal: LTG Patient will perform toileting task (3/3 steps) with assistance level (OT) Description: LTG: Patient will perform toileting task (3/3 steps) with assistance level (OT)  Outcome: Completed/Met   Problem: RH Tub/Shower Transfers Goal: LTG Patient will perform tub/shower transfers w/assist (OT) Description: LTG: Patient will perform tub/shower transfers with assist, with/without cues using equipment (OT) Outcome: Completed/Met   Problem: RH Memory Goal: LTG Patient will demonstrate ability for day to day recall/carry over during activities of daily living with assistance level (OT) Description: LTG:  Patient will demonstrate ability for day to day recall/carry over during activities of daily living with assistance level (OT). Outcome: Completed/Met   Problem: RH Attention Goal: LTG Patient will demonstrate this level of attention during functional activites (OT) Description: LTG:  Patient will demonstrate this level of attention during functional  activites  (OT) Outcome: Completed/Met   Problem: RH Awareness Goal: LTG: Patient will demonstrate awareness during functional activites type of (OT) Description: LTG: Patient will demonstrate awareness during functional activites type of (OT) Outcome: Completed/Met

## 2022-12-26 ENCOUNTER — Other Ambulatory Visit (HOSPITAL_COMMUNITY): Payer: Self-pay

## 2022-12-26 DIAGNOSIS — K5900 Constipation, unspecified: Secondary | ICD-10-CM

## 2022-12-26 DIAGNOSIS — I951 Orthostatic hypotension: Secondary | ICD-10-CM

## 2022-12-26 MED ORDER — MELATONIN 5 MG PO TABS
5.0000 mg | ORAL_TABLET | Freq: Every evening | ORAL | 0 refills | Status: AC | PRN
  Filled 2022-12-26: qty 30, 30d supply, fill #0

## 2022-12-26 NOTE — Progress Notes (Signed)
Patient ID: Leslie [name], female   DOB: [birth year], 82 y.o.   MRN: [id number]  Met with pt and daughter and they want a wheelchair now instead of the transport chair. Have contacted [employer] and placed wheelchair order and to have switch out transport chair at home. So pt and daughter could go home and not be delayed.pt and daughter were in agreement and ready to discharge home.

## 2022-12-26 NOTE — Progress Notes (Signed)
PROGRESS NOTE   Subjective/Complaints:  No new concerns. Looking forward to going home today.   ROS:  limited by cognition, however c/o constipation- and poor sleep and anxiety Denies CP or SOB Objective:   No results found. No results for input(s): "WBC", "HGB", "HCT", "PLT" in the last 72 hours.  No results for input(s): "NA", "K", "CL", "CO2", "GLUCOSE", "BUN", "CREATININE", "CALCIUM" in the last 72 hours.   Intake/Output Summary (Last 24 hours) at 12/26/2022 0827 Last data filed at 12/26/2022 0755 Gross per 24 hour  Intake 826 ml  Output --  Net 826 ml          Physical Exam: Vital Signs Blood pressure 130/72, pulse 67, temperature 97.9 F (36.6 C), temperature source Oral, resp. rate 18, height '5\' 7"'$  (1.702 m), weight 64 kg, SpO2 98 %.    General: No acute distress, sitting in WC Mood and affect are appropriate Heart: Regular rate and rhythm no rubs murmurs or extra sounds Lungs: Clear to auscultation, breathing unlabored, no rales or wheezes, good air movement Abdomen: Positive bowel sounds, soft nontender to palpation, nondistended Extremities: No clubbing, cyanosis, or edema   Neurologic: oriented to person. Cranial nerves II through XII intact, motor strength is 4/5 in bilateral deltoid, bicep, tricep, grip, hip flexor, knee extensors, ankle dorsiflexor and plantar flexor,  Oriented to person , place month and year  Musculoskeletal: Full range of motion in all 4 extremities. No joint swelling   Assessment/Plan: 1. Functional deficits which require 3+ hours per day of interdisciplinary therapy in a comprehensive inpatient rehab setting. Physiatrist is providing close team supervision and 24 hour management of active medical problems listed below. Physiatrist and rehab team continue to assess barriers to discharge/monitor patient progress toward functional and medical goals  Care Tool:  Bathing     Body parts bathed by patient: Right arm, Left arm, Chest, Abdomen, Front perineal area, Right upper leg, Left upper leg, Face, Left lower leg, Right lower leg, Buttocks   Body parts bathed by helper: Left lower leg, Right lower leg, Buttocks     Bathing assist Assist Level: Contact Guard/Touching assist     Upper Body Dressing/Undressing Upper body dressing   What is the patient wearing?: Bra, Pull over shirt    Upper body assist Assist Level: Supervision/Verbal cueing    Lower Body Dressing/Undressing Lower body dressing      What is the patient wearing?: Pants, Incontinence brief     Lower body assist Assist for lower body dressing: Supervision/Verbal cueing     Toileting Toileting    Toileting assist Assist for toileting: Minimal Assistance - Patient > 75%     Transfers Chair/bed transfer  Transfers assist     Chair/bed transfer assist level: Contact Guard/Touching assist Chair/bed transfer assistive device: Programmer, multimedia   Ambulation assist      Assist level: Contact Guard/Touching assist Assistive device: Walker-rolling Max distance: 183f   Walk 10 feet activity   Assist     Assist level: Contact Guard/Touching assist Assistive device: Walker-rolling   Walk 50 feet activity   Assist    Assist level: Contact Guard/Touching assist Assistive device: Walker-rolling  Walk 150 feet activity   Assist Walk 150 feet activity did not occur: Safety/medical concerns (impaired endurance)  Assist level: Contact Guard/Touching assist Assistive device: Walker-rolling    Walk 10 feet on uneven surface  activity   Assist Walk 10 feet on uneven surfaces activity did not occur: Safety/medical concerns   Assist level: Contact Guard/Touching assist Assistive device: Walker-rolling   Wheelchair     Assist Is the patient using a wheelchair?: No Type of Wheelchair: Manual    Wheelchair assist level: Dependent - Patient  0%      Wheelchair 50 feet with 2 turns activity    Assist        Assist Level: Dependent - Patient 0%   Wheelchair 150 feet activity     Assist      Assist Level: Dependent - Patient 0%   Blood pressure 130/72, pulse 67, temperature 97.9 F (36.6 C), temperature source Oral, resp. rate 18, height '5\' 7"'$  (1.702 m), weight 64 kg, SpO2 98 %.  Medical Problem List and Plan: 1. 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  Antithrombotics: -DVT/anticoagulation:  Pharmaceutical: Eliquis             -antiplatelet therapy: N/A 3. Pain Management: Tylenol as needed  5. Neuropsych/cognition: This patient is capable of making personal healthcare with assist but not financial decisions on her own behalf  Has clinical and imaging study evidence of vascular dementia 6. Skin/Wound Care: Routine skin checks 7. Fluids/Electrolytes/Nutrition: Routine in and outs with follow-up chemistries 8.  Atrial fibrillation.  Continue amiodarone 200 mg daily.  Eliquis as directed.  Cardiac rate controlled 9.  Orthostasis improved .  Continue ProAmatine,  Increased to TID as pt still has orthostatic drops with mild symptoms, however no sys BPs<39mHg have been recorded   1/16, some elevated systolic BPs but this is in supine, no symptomatic orthostatic drops when up , still drops ~364mg   1/17 improved, BP has been stable      12/26/2022    4:06 AM 12/25/2022    7:45 PM 12/25/2022    2:25 PM  Vitals with BMI  Systolic [id number] 72 83 85  Pulse 67 80 85    10.  Hypothyroidism.  Continue Synthroid  11.  Constipation- improved 2 lg BM yesterday , will reduce senna to 2 tabs bid  Will need to increase senna back to 3tabs   1/17 LBM yesterday 12. Agitation improved  seroquel '25mg'$  daily prn to help calm patient still takes ~4pm each day  will reduce to 12.5 mg, patient slept worse will go back to 25 mg 13. Chronic anxiety: started lexapro '5mg'$  HS on 1/7  1/14- nursing reports anxiety still a large issue- ill try Buspar 5 mg TID,monitor effect  14. Poor sleep  1/14- could be from anxiety- per nursing- will not add any more meds yet- see how she does with Buspar  LOS: 16 days A FACE TO FACE EVALUATION WAS PERFORMED  YuJennye Boroughs/17/2024, 8:27 AM

## 2022-12-26 NOTE — Progress Notes (Signed)
Patient ID: Leslie [name], female   DOB: March 22, 1941, 82 y.o.   MRN: [id number]  SW met with daughter to discuss d/c. Daughter expressed she has not heard any FU on their iPad, SW will provide daughter with grievance phone number and enter safety portal. Daughter made co payment on DME items last night and waiting on delivery of items before discharge. [employer] contacted. No additional questions or concerns.

## 2022-12-27 ENCOUNTER — Telehealth: Payer: Self-pay

## 2022-12-27 NOTE — Telephone Encounter (Signed)
Transitional Care call--Leslie [name]    Are you/is patient experiencing any problems since coming home? Very anxious and worried. She feels like her Afib is coming back. No sleep at night, up all night. Given melatonin Are there any questions regarding any aspect of care? No Are there any questions regarding medications administration/dosing? No Are meds being taken as prescribed? Yes Patient should review meds with caller to confirm Have there been any falls? 1 fall, no injury Has Home Health been to the house and/or have they contacted you? Yes If not, have you tried to contact them? Can we help you contact them? Are bowels and bladder emptying properly? Yes Are there any unexpected incontinence issues? No If applicable, is patient following bowel/bladder programs? Any fevers, problems with breathing, unexpected pain? No Are there any skin problems or new areas of breakdown? Bed sore on bottom, put Aquaphor on it Has the patient/family member arranged specialty MD follow up (ie cardiology/neurology/renal/surgical/etc)? Will be calling to schedule Can we help arrange? Does the patient need any other services or support that we can help arrange? No Are caregivers following through as expected in assisting the patient? Yes Has the patient quit smoking, drinking alcohol, or using drugs as recommended? Yes  Appointment time 2:00 pm, arrive time 1:40 pm on 01/08/23 with Dr. Letta Pate [address] [phone]

## 2022-12-27 NOTE — Telephone Encounter (Signed)
Can you look at #1 in absence of Dr. Letta Pate?

## 2022-12-27 NOTE — Telephone Encounter (Signed)
[name] (Daughter, ph [phone] ) called and left a message for call back.  She has concerns about Leslie [name]'s medications, dosing & symptoms. Patient was discharged on 12/26/2022, Dx Stroke.  3 attempts to return the call were made. However the voicemail is full. A message was left on [name]  (ph [phone]) voicemail for a call back.

## 2022-12-28 ENCOUNTER — Telehealth: Payer: Self-pay | Admitting: Physical Medicine & Rehabilitation

## 2022-12-28 NOTE — Telephone Encounter (Signed)
Called to f/u on this question. No answer. Mailbox full. Called pt on her number, no answer, left discrete VM

## 2022-12-28 NOTE — Telephone Encounter (Signed)
Leslie [name] can you call this patients daughter in Dr. Letta Pate absence. While speaking with her yesterday she was concerned about her mothers sleep and stating that she is very anxious, thinking she is in Afib again. Daughter Leslie [name] [phone]

## 2022-12-28 NOTE — Telephone Encounter (Signed)
Return Ms. Leslie [name] call,  She stated Linna Hoff PA called er and all of her questions was answered.

## 2022-12-28 NOTE — Telephone Encounter (Signed)
Patient's daughter [name] needs a call back about her mother's medication.  Please call her at [phone].

## 2023-01-01 ENCOUNTER — Telehealth: Payer: Self-pay | Admitting: Physical Medicine & Rehabilitation

## 2023-01-01 NOTE — Telephone Encounter (Signed)
Lytle Michaels PT with [facility] needs to get verbal to add MSW to plan of care.  Please call him at [phone].

## 2023-01-07 ENCOUNTER — Other Ambulatory Visit: Payer: Self-pay | Admitting: Family

## 2023-01-07 ENCOUNTER — Encounter (HOSPITAL_COMMUNITY): Payer: Self-pay | Admitting: Neurology

## 2023-01-07 DIAGNOSIS — R609 Edema, unspecified: Secondary | ICD-10-CM

## 2023-01-07 NOTE — Progress Notes (Deleted)
Cardiology Clinic Note   Patient Name: Leslie [name] Date of Encounter: 01/07/2023  Primary Care Provider:  Sonia Side., FNP Primary Cardiologist:  Peter Martinique, MD  Patient Profile    Leslie [name] 82 year old female presents the clinic today for follow-up evaluation of her atrial fibrillation and chronic systolic CHF.  Past Medical History    Past Medical History:  Diagnosis Date   Arthritis    Back pain due to injury    fell off a ladder and fractured back-T12 down (multiple surgeries w/ onging pain)   Chronic kidney disease    "stage 3 kidney failure" improving per pt   Hypothyroidism    Insomnia    Plantar fasciitis    Stomach ulcer ~2000   Stroke [facility])    TIA X2   2001, jan 2017   Vertigo, benign paroxysmal, bilateral    Vitamin D deficiency    Past Surgical History:  Procedure Laterality Date   ABDOMINAL HYSTERECTOMY  1974   ANKLE FUSION Right 06/07/2016   Procedure: RIGHT TALONAVICULAR ARTHRODESIS ;  Surgeon: Wylene Simmer, MD;  Location: [facility];  Service: Orthopedics;  Laterality: Right;   APPENDECTOMY     BACK SURGERY     x4  fusion T12 thru sacrum; U rod in sacrum   BUBBLE STUDY  11/26/2022   Procedure: BUBBLE STUDY;  Surgeon: Elouise Munroe, MD;  Location: [place];  Service: Cardiovascular;;   CARDIOVERSION N/A 11/26/2022   Procedure: CARDIOVERSION;  Surgeon: Elouise Munroe, MD;  Location: [facility] ENDOSCOPY;  Service: Cardiovascular;  Laterality: N/A;   CARDIOVERSION N/A 12/05/2022   Procedure: CARDIOVERSION;  Surgeon: Pixie Casino, MD;  Location: [facility] ENDOSCOPY;  Service: Cardiovascular;  Laterality: N/A;   CATARACT EXTRACTION, BILATERAL Bilateral 2015   GASTROCNEMIUS RECESSION Right 06/07/2016   Procedure: RIGHT GASTROC RECESSION ;  Surgeon: Wylene Simmer, MD;  Location: [place];  Service: Orthopedics;  Laterality: Right;   HAND SURGERY Left 2003   x2   IR ANGIO EXTRACRAN SEL COM CAROTID  INNOMINATE UNI L MOD SED  11/03/2022   IR CT HEAD LTD  11/03/2022   IR PERCUTANEOUS ART THROMBECTOMY/INFUSION INTRACRANIAL INC DIAG ANGIO  11/03/2022   L heel surgery for torn tendon Left 2015   PLANTAR FASCIA SURGERY Right 2009   surgery at [place] 11/03/2022   Procedure: IR WITH ANESTHESIA;  Surgeon: Radiologist, Medication, MD;  Location: [place];  Service: Radiology;  Laterality: N/A;   SHOULDER ARTHROSCOPY Right 2007   TEE WITHOUT CARDIOVERSION N/A 11/26/2022   Procedure: TRANSESOPHAGEAL ECHOCARDIOGRAM (TEE);  Surgeon: Elouise Munroe, MD;  Location: [facility] ENDOSCOPY;  Service: Cardiovascular;  Laterality: N/A;   TENDON REPAIR Right 1949   foot- lacerated tendon in arch of foot   THORACENTESIS N/A 10/30/2022   Procedure: THORACENTESIS;  Surgeon: Julian Hy, DO;  Location: [place];  Service: Cardiopulmonary;  Laterality: N/A;   TOTAL HIP ARTHROPLASTY Left 10/05/2021   Procedure: TOTAL HIP ARTHROPLASTY ANTERIOR APPROACH;  Surgeon: Rod Can, MD;  Location: [facility];  Service: Orthopedics;  Laterality: Left;    Allergies  Allergies  Allergen Reactions   Duloxetine Nausea Only   Voltaren [Diclofenac Sodium] Shortness Of Breath and Palpitations   Lisinopril Other (See Comments)   Aspirin Other (See Comments)    ulcers   Diphenhydramine Other (See Comments)   Gabapentin Itching   Metoprolol     Cold sweat/exhaustion    Ultram [  Tramadol Hcl] Other (See Comments)    Effects cognitive abilities    Proton Pump Inhibitors Rash    Itching, rash    History of Present Illness    Leslie [name] has a PMH of atrial fibrillation, cerebral aneurysm, acute on chronic systolic CHF, hypotension, CVA, PNA, pleural effusion, COPD, hypothyroidism, CKD, protein calorie malnutrition, and pressure injury of skin.  She was admitted to the hospital on 11/23/2022 and discharged on 12/10/2022.  She underwent TEE DCCV on 1218 but had recurrence of atrial  fibrillation/flutter.  She underwent repeat cardioversion 12/27 after amiodarone loading.  She was admitted to inpatient rehab on 12/10/2022 and discharged on 12/26/2022.  She reported a cough that had been treated with antibiotics and steroids.  She reported 2-3 weeks of palpitations.  She presented to her PCP who noted that she was in atrial fibrillation (10/29/2022).  She was sent to the emergency department and placed on heparin and Cardizem.  A CT chest showed right pleural effusion and right upper lobe pneumonia.  She was started on broad-spectrum antibiotics and underwent thoracentesis for 500 mL effusion.  It was felt that her atrial fibrillation was likely due to her pneumonia.  Her echocardiogram showed an EF of 40-45% with global hypokinesis.  She was started on metoprolol.  She reported shortness of breath and metoprolol was discontinued.  Her Cardizem was uptitrated.  She was placed on Eliquis.  On 1125 she was noted to have a sudden onset of right gaze with left hemiparesis and neglect.  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  She underwent cerebral angiogram with thrombectomy of right MCA occlusion with complete revascularization.  Neurology felt that her embolic CVA was due to atrial fibrillation even with apixaban on board.  A follow-up MRI showed small acute infarct of right frontal temporal lobe and caudate body.  She was noted to have atrial flutter on 11/25.  Her Cardizem was uptitrated but she continued to have heart rates in the 120s at rest.  She was admitted to inpatient rehab 11/08/2022.  She was noted to have atrial fibrillation with an elevated heart rate in the 160s and bouts of orthostasis.  On 11/23/2022 she was received fluid bolus for hypotension.  Dr. Percival Spanish was consulted and recommended IV amiodarone bolus for rate control.  She stabilized and her rate was controlled.  Her hospital course was complicated by Enterococcus UTI.  She completed a course of antibiotics.   She  presents to the clinic today for follow-up evaluation and states***  *** denies chest pain, shortness of breath, lower extremity edema, fatigue, palpitations, melena, hematuria, hemoptysis, diaphoresis, weakness, presyncope, syncope, orthopnea, and PND.  Atrial fibrillation-EKG today shows***.  Felt to be  secondary to pneumonia with pleural effusion.  She underwent TEE with DCCV on 1218 and underwent repeat DCCV on 12/05/2022 after amiodarone load.  Unable to tolerate nodal agents due to significant orthostatic hypotension and nocturnal sinus bradycardia Continue amiodarone, apixaban Avoid triggers caffeine, chocolate, EtOH, dehydration etc.  Chronic systolic CHF-euvolemic today.  Weight stable.  LVEF previously noted to be 40-45% with global hypokinesis and moderate asymmetric LVH with moderate-severe mitral valve regurgitation.  TEE 11/26/2022 showed EF of 20% in the setting of A-fib with RVR.  Not a candidate for GDMT due to orthostatic hypotension.  Repeat TTE showed EF of 25-30%.  Plan for rate control. Continue amiodarone.  Hypotension-BP today***. Continue midodrine Maintain p.o. hydration Continue lower extremity support stockings and abdominal binder.  Disposition: Follow-up with Dr.  Martinique in 2-3 months. Home Medications    Prior to Admission medications   Medication Sig Start Date End Date Taking? Authorizing Provider  acetaminophen (TYLENOL) 325 MG tablet Take 1-2 tablets (325-650 mg total) by mouth every 4 (four) hours as needed for mild pain. 11/13/22   Love, Ivan Anchors, PA-C  amiodarone (PACERONE) 200 MG tablet Take 1 tablet (200 mg total) by mouth daily. 12/21/22   Angiulli, Lavon Paganini, PA-C  apixaban (ELIQUIS) 5 MG TABS tablet Take 1 tablet (5 mg total) by mouth 2 (two) times daily. 12/21/22 01/25/23  Angiulli, Lavon Paganini, PA-C  busPIRone (BUSPAR) 5 MG tablet Take 1 tablet (5 mg total) by mouth 3 (three) times daily. 12/24/22   Angiulli, Lavon Paganini, PA-C  cyanocobalamin 1000 MCG tablet  Take 1 tablet (1,000 mcg total) by mouth daily. 12/21/22   Angiulli, Lavon Paganini, PA-C  escitalopram (LEXAPRO) 5 MG tablet Take 1 tablet (5 mg total) by mouth at bedtime. 12/21/22   Angiulli, Lavon Paganini, PA-C  famotidine (PEPCID) 20 MG tablet Take 1 tablet (20 mg total) by mouth daily. 12/21/22   Angiulli, Lavon Paganini, PA-C  levothyroxine (SYNTHROID) 100 MCG tablet Take 1 tablet (100 mcg total) by mouth daily at 6 (six) AM. 12/21/22   Angiulli, Lavon Paganini, PA-C  melatonin 5 MG TABS Take 1 tablet (5 mg total) by mouth at bedtime as needed. 12/26/22   Angiulli, Lavon Paganini, PA-C  midodrine (PROAMATINE) 5 MG tablet Take 1 tablet (5 mg total) by mouth 2 (two) times daily with a meal. 12/21/22   Angiulli, Lavon Paganini, PA-C  polyethylene glycol (MIRALAX / GLYCOLAX) 17 g packet Take 17 g by mouth daily. 12/21/22   Angiulli, Lavon Paganini, PA-C  rosuvastatin (CRESTOR) 10 MG tablet Take 1 tablet (10 mg total) by mouth daily. 12/21/22 01/25/23  Angiulli, Lavon Paganini, PA-C    Family History    Family History  Problem Relation Age of Onset   Hypertension Mother    Lung cancer Mother    Lung cancer Father    Diabetes Sister    Hypertension Sister    Congestive Heart Failure Sister    Arthritis Brother    She indicated that her mother is deceased. She indicated that her father is deceased. She indicated that her sister is alive. She indicated that her brother is alive.  Social History    Social History   Socioeconomic History   Marital status: Widowed    Spouse name: Not on file   Number of children: 1   Years of education: HS+   Highest education level: Not on file  Occupational History   Occupation: Retired   Tobacco Use   Smoking status: Former    Types: Cigarettes    Quit date: 06/04/1986    Years since quitting: 36.6   Smokeless tobacco: Never   Tobacco comments:    Only smoked a few years  Vaping Use   Vaping Use: Never used  Substance and Sexual Activity   Alcohol use: No    Alcohol/week: 0.0 standard drinks  of alcohol   Drug use: No   Sexual activity: Not on file  Other Topics Concern   Not on file  Social History Narrative   Drinks about 3 cups of coffee a day    Social Determinants of Health   Financial Resource Strain: Not on file  Food Insecurity: No Food Insecurity (11/29/2022)   Hunger Vital Sign    Worried About Running Out of Food in the Last  Year: Never true    [place] in the Last Year: Never true  Transportation Needs: No Transportation Needs (11/29/2022)   PRAPARE - Hydrologist (Medical): No    Lack of Transportation (Non-Medical): No  Physical Activity: Not on file  Stress: Not on file  Social Connections: Not on file  Intimate Partner Violence: Not At Risk (11/29/2022)   Humiliation, Afraid, Rape, and Kick questionnaire    Fear of Current or Ex-Partner: No    Emotionally Abused: No    Physically Abused: No    Sexually Abused: No     Review of Systems    General:  No chills, fever, night sweats or weight changes.  Cardiovascular:  No chest pain, dyspnea on exertion, edema, orthopnea, palpitations, paroxysmal nocturnal dyspnea. Dermatological: No rash, lesions/masses Respiratory: No cough, dyspnea Urologic: No hematuria, dysuria Abdominal:   No nausea, vomiting, diarrhea, bright red blood per rectum, melena, or hematemesis Neurologic:  No visual changes, wkns, changes in mental status. All other systems reviewed and are otherwise negative except as noted above.  Physical Exam    VS:  There were no vitals taken for this visit. , BMI There is no height or weight on file to calculate BMI. GEN: Well nourished, well developed, in no acute distress. HEENT: normal. Neck: Supple, no JVD, carotid bruits, or masses. Cardiac: RRR, no murmurs, rubs, or gallops. No clubbing, cyanosis, edema.  Radials/DP/PT 2+ and equal bilaterally.  Respiratory:  Respirations regular and unlabored, clear to auscultation bilaterally. GI: Soft, nontender,  nondistended, BS + x 4. MS: no deformity or atrophy. Skin: warm and dry, no rash. Neuro:  Strength and sensation are intact. Psych: Normal affect.  Accessory Clinical Findings    Recent Labs: 11/01/2022: B Natriuretic Peptide 791.8 11/25/2022: TSH 11.301 12/10/2022: Magnesium 2.4 12/11/2022: ALT 19; BUN 15; Creatinine, Ser 1.30; Hemoglobin 12.3; Platelets 228; Potassium 3.8; Sodium 138   Recent Lipid Panel    Component Value Date/Time   CHOL 151 10/31/2022 0108   TRIG 61 10/31/2022 0108   HDL 53 10/31/2022 0108   CHOLHDL 2.8 10/31/2022 0108   VLDL 12 10/31/2022 0108   LDLCALC 86 10/31/2022 0108    No BP recorded.  {Refresh Note OR Click here to enter BP  :1}***    ECG personally reviewed by me today- *** - No acute changes  TEE 11/26/2022 Diagnosing Phys: Cherlynn Kaiser MD   PROCEDURE: After discussion of the risks and benefits of a TEE, an  informed consent was obtained from the patient. TEE procedure time was 11  minutes. The transesophogeal probe was passed without difficulty through  the esophogus of the patient. Imaged  were obtained with the patient in a left lateral decubitus position. Local  oropharyngeal anesthetic was provided with Cetacaine. Sedation performed  by different physician. The patient was monitored while under deep  sedation. Anesthestetic sedation was  provided intravenously by Anesthesiology: 72.'74mg'$  of Propofol, '60mg'$  of  Lidocaine. Image quality was good. The patient's vital signs; including  heart rate, blood pressure, and oxygen saturation; remained stable  throughout the procedure. The patient  developed no complications during the procedure. A successful direct  current cardioversion was performed at 120 joules with 1 attempt.    IMPRESSIONS     1. Left ventricular ejection fraction, by estimation, is 20%. The left  ventricle has severely decreased function.   2. Right ventricular systolic function is severely reduced. The right   ventricular size is mildly enlarged.  3. Left atrial size was moderately dilated. No left atrial/left atrial  appendage thrombus was detected. The LAA emptying velocity was 35 cm/s.   4. A small pericardial effusion is present.   5. The mitral valve is grossly normal. Severe mitral valve regurgitation,  likely secondary to LV dysfunction and atrial functional MR. Multiple  jets, appears commissural.   6. The aortic valve is grossly normal. Aortic valve regurgitation is  mild.   7. There is mild (Grade II) atheroma plaque involving the aortic arch and  descending aorta.   Conclusion(s)/Recommendation(s): No LA/LAA thrombus identified. Successful  cardioversion performed with restoration of normal sinus rhythm.   FINDINGS   Left Ventricle: Left ventricular ejection fraction, by estimation, is  20%. The left ventricle has severely decreased function. The left  ventricular internal cavity size was normal in size.   Right Ventricle: The right ventricular size is mildly enlarged. Right  vetricular wall thickness was not well visualized. Right ventricular  systolic function is severely reduced.   Left Atrium: Left atrial size was moderately dilated. No left atrial/left  atrial appendage thrombus was detected. The LAA emptying velocity was 35  cm/s.   Right Atrium: Right atrial size was normal in size.   Pericardium: A small pericardial effusion is present.   Mitral Valve: The mitral valve is grossly normal. There is mild thickening  of the mitral valve leaflet(s). Severe mitral valve regurgitation.   Tricuspid Valve: The tricuspid valve is normal in structure. Tricuspid  valve regurgitation is mild.   Aortic Valve: The aortic valve is grossly normal. Aortic valve  regurgitation is mild.   Pulmonic Valve: The pulmonic valve was normal in structure. Pulmonic valve  regurgitation is trivial.   Aorta: The aortic root and ascending aorta are structurally normal, with  no evidence  of dilitation. There is mild (Grade II) atheroma plaque  involving the aortic arch and descending aorta.   Venous: A systolic blunting flow pattern is recorded from the right upper  pulmonary vein and the left upper pulmonary vein.   IAS/Shunts: No atrial level shunt detected by color flow Doppler.   Additional Comments: Spectral Doppler performed.    Echocardiogram 11/28/2022  IMPRESSIONS     1. Limited study with only 45 images, technically difficult   2. Left ventricular ejection fraction, by estimation, is 40 to 45%. The  left ventricle has mildly decreased function. The left ventricle  demonstrates global hypokinesis. There is mild asymmetric left ventricular  hypertrophy. Left ventricular diastolic  function could not be evaluated.   3. The mitral valve is abnormal. Mild to moderate mitral valve  regurgitation.   4. Rhythm strip during this exam demonstrates atrial fib with rapid  ventricular response.   5. The aortic valve is tricuspid. Aortic valve regurgitation is not  visualized.   6. Right ventricular systolic function was not well visualized. The right  ventricular size is not well visualized.   Comparison(s): Changes from prior study are noted. 10/30/2022: LVEF  40-45%, modearate to severe MR.   Conclusion(s)/Recommendation(s): Consider repeat limited echo when the HR  is lower.   FINDINGS   Left Ventricle: Left ventricular ejection fraction, by estimation, is 40  to 45%. The left ventricle has mildly decreased function. The left  ventricle demonstrates global hypokinesis. The left ventricular internal  cavity size was normal in size. There is   mild asymmetric left ventricular hypertrophy. Left ventricular diastolic  function could not be evaluated due to atrial fibrillation. Left  ventricular diastolic function  could not be evaluated.   Right Ventricle: The right ventricular size is not well visualized. Right  vetricular wall thickness was not assessed.  Right ventricular systolic  function was not well visualized.   Left Atrium: Left atrial size was normal in size.   Right Atrium: Right atrial size was normal in size.   Pericardium: There is no evidence of pericardial effusion.   Mitral Valve: The mitral valve is abnormal. Mild to moderate mitral valve  regurgitation, with posteriorly-directed jet.   Tricuspid Valve: The tricuspid valve is not well visualized. Tricuspid  valve regurgitation is not demonstrated.   Aortic Valve: The aortic valve is tricuspid. Aortic valve regurgitation is  not visualized.   Pulmonic Valve: The pulmonic valve was not well visualized. Pulmonic valve  regurgitation is not visualized.   Aorta: The aortic root and ascending aorta are structurally normal, with  no evidence of dilitation.   IAS/Shunts: No atrial level shunt detected by color flow Doppler.   EKG: Rhythm strip during this exam demonstrates atrial fib with rapid  ventricular response.    Assessment & Plan   1.  ***   Jossie Ng. Aswad Wandrey NP-C     01/07/2023, 12:53 PM [address] Office [phone] Fax [phone]    I spent***minutes examining this patient, reviewing medications, and using patient centered shared decision making involving her cardiac care.  Prior to her visit I spent greater than 20 minutes reviewing her past medical history,  medications, and prior cardiac tests.

## 2023-01-08 ENCOUNTER — Encounter: Payer: Self-pay | Admitting: Physical Medicine & Rehabilitation

## 2023-01-08 ENCOUNTER — Encounter: Payer: Medicare HMO | Attending: Physical Medicine & Rehabilitation | Admitting: Physical Medicine & Rehabilitation

## 2023-01-08 ENCOUNTER — Ambulatory Visit
Admission: RE | Admit: 2023-01-08 | Discharge: 2023-01-08 | Disposition: A | Discharge status: Home / Self Care | Payer: Medicare HMO | Source: Ambulatory Visit | Attending: Family | Admitting: Family

## 2023-01-08 VITALS — BP 103/73 | HR 88 | Ht 67.0 in | Wt 143.0 lb

## 2023-01-08 DIAGNOSIS — I6601 Occlusion and stenosis of right middle cerebral artery: Secondary | ICD-10-CM | POA: Diagnosis not present

## 2023-01-08 DIAGNOSIS — R609 Edema, unspecified: Secondary | ICD-10-CM

## 2023-01-08 NOTE — Progress Notes (Signed)
Subjective:    Patient ID: Leslie [name], female    DOB: 1941/02/02, 82 y.o.   MRN: [id number] 82 y.o. right-handed female with history of CVA, CKD GERD recent bouts of cough treated with antibiotics and steroids admitted with reports of 2 to 3 weeks of palpitations, seen by PCP for lightheadedness and dizziness found to have A-fib with RVR and sent to [facility] 10/29/2022 for evaluation.  She was started on intravenous Cardizem and heparin.  CT chest done due to episodes of chest pain revealing right layering pleural effusion and right upper lobe pneumonia.  She was treated with broad-spectrum antibiotics and underwent thoracentesis for 500 cc parapneumonic effusion by critical care medicine.  Dr. Araceli Bouche consulted for management of atrial fibrillation and felt that new onset likely due to pneumonia.  Echocardiogram with ejection fraction of 40 to 45% with LV global hypokinesis.  She was started on metoprolol but reported shortness of breath side effects therefore was discontinued Cardizem titrated.  She transitioned to Eliquis.  On 11/25 patient had sudden onset of right gaze deviation with left hemiparesis and neglect.  She was found to have right M1 occlusion and patient underwent cerebral angiogram with thrombectomy of right MCA occlusion segmented with complete revascularization.  Neurology felt stroke was embolic due to A-fib even with Eliquis.  Follow-up MRI done revealing small acute infarct right frontal temporal lobe and caudate body with advanced chronic small vessel disease.  Cardiology service follow-up for a flutter converted with intravenous amiodarone 11/25 Cardizem titrated but heart rates continue to run in the 120s at rest.  Modified barium swallow revealing moderate spillage with buccal residual and delay in swallow with occasional trace aspiration.  She was placed on dysphagia #3 diet.  Admitted to inpatient rehab services 11/08/2022 for bouts of orthostasis A-fib heart rate  in the 160s.  On 11/23/2022 she was started on fluid bolus due to hypotension.  Dr. Percival Spanish was consulted recommending intravenous amiodarone bolus for rate control.  Dr. Lorrine Kin consulted for hospitalist and assistance to discharge to acute care services as patient would be placed on telemetry.  Patient stabilized cardiac rate controlled remained on Eliquis as well as amiodarone.  Hospital course Enterococcus UTI completed course of antibiotics.  Due to patient decreased functional mobility she was readmitted to inpatient rehab services for comprehensive rehab therapies   HPI  PT 2 x per week SLP 1x per week  PCP  Switched Buspar to Hydroxyzine No dizziness with standing   Miralax  Stool softener No significant improvements with above.  Cannot retain suppositories.    Pain Inventory Average Pain 3 Pain Right Now 4 My pain is intermittent and aching  LOCATION OF PAIN  groin  BOWEL Number of stools per week: 1-2 Oral laxative use Yes  Type of laxative laxative, stool softner, dulcalax Enema or suppository use No  History of colostomy No  Incontinent No   BLADDER Pads In and out cath, frequency . Able to self cath  . Bladder incontinence No  Frequent urination Yes  Leakage with coughing No  Difficulty starting stream No  Incomplete bladder emptying No    Mobility use a walker how many minutes can you walk? 3-5 ability to climb steps?  yes do you drive?  no  Function not employed: date last employed 52 yrs disabled: date disabled 1986 I need assistance with the following:  dressing, bathing, toileting, meal prep, household duties, and shopping  Neuro/Psych trouble walking confusion anxiety  Prior Studies  Hospital f/u  Physicians involved in your care Hospital f/u   Family History  Problem Relation Age of Onset   Hypertension Mother    Lung cancer Mother    Lung cancer Father    Diabetes Sister    Hypertension Sister    Congestive Heart  Failure Sister    Arthritis Brother    Social History   Socioeconomic History   Marital status: Widowed    Spouse name: Not on file   Number of children: 1   Years of education: HS+   Highest education level: Not on file  Occupational History   Occupation: Retired   Tobacco Use   Smoking status: Former    Types: Cigarettes    Quit date: 06/04/1986    Years since quitting: 36.6   Smokeless tobacco: Never   Tobacco comments:    Only smoked a few years  Vaping Use   Vaping Use: Never used  Substance and Sexual Activity   Alcohol use: No    Alcohol/week: 0.0 standard drinks of alcohol   Drug use: No   Sexual activity: Not on file  Other Topics Concern   Not on file  Social History Narrative   Drinks about 3 cups of coffee a day    Social Determinants of Health   Financial Resource Strain: Not on file  Food Insecurity: No Food Insecurity (11/29/2022)   Hunger Vital Sign    Worried About Running Out of Food in the Last Year: Never true    Ran Out of Food in the Last Year: Never true  Transportation Needs: No Transportation Needs (11/29/2022)   PRAPARE - Hydrologist (Medical): No    Lack of Transportation (Non-Medical): No  Physical Activity: Not on file  Stress: Not on file  Social Connections: Not on file   Past Surgical History:  Procedure Laterality Date   [place] Right 06/07/2016   Procedure: RIGHT TALONAVICULAR ARTHRODESIS ;  Surgeon: Wylene Simmer, MD;  Location: [facility];  Service: Orthopedics;  Laterality: Right;   APPENDECTOMY     BACK SURGERY     x4  fusion T12 thru sacrum; U rod in sacrum   BUBBLE STUDY  11/26/2022   Procedure: BUBBLE STUDY;  Surgeon: Elouise Munroe, MD;  Location: [place];  Service: Cardiovascular;;   CARDIOVERSION N/A 11/26/2022   Procedure: CARDIOVERSION;  Surgeon: Elouise Munroe, MD;  Location: [facility] ENDOSCOPY;  Service: Cardiovascular;   Laterality: N/A;   CARDIOVERSION N/A 12/05/2022   Procedure: CARDIOVERSION;  Surgeon: Pixie Casino, MD;  Location: [facility] ENDOSCOPY;  Service: Cardiovascular;  Laterality: N/A;   CATARACT EXTRACTION, BILATERAL Bilateral 2015   GASTROCNEMIUS RECESSION Right 06/07/2016   Procedure: RIGHT GASTROC RECESSION ;  Surgeon: Wylene Simmer, MD;  Location: [place];  Service: Orthopedics;  Laterality: Right;   HAND SURGERY Left 2003   x2   IR ANGIO EXTRACRAN SEL COM CAROTID INNOMINATE UNI L MOD SED  11/03/2022   IR CT HEAD LTD  11/03/2022   IR PERCUTANEOUS ART THROMBECTOMY/INFUSION INTRACRANIAL INC DIAG ANGIO  11/03/2022   L heel surgery for torn tendon Left 2015   PLANTAR FASCIA SURGERY Right 2009   surgery at [place] 11/03/2022   Procedure: IR WITH ANESTHESIA;  Surgeon: Radiologist, Medication, MD;  Location: [place];  Service: Radiology;  Laterality: N/A;   SHOULDER ARTHROSCOPY Right 2007   TEE WITHOUT CARDIOVERSION  N/A 11/26/2022   Procedure: TRANSESOPHAGEAL ECHOCARDIOGRAM (TEE);  Surgeon: Elouise Munroe, MD;  Location: [facility] ENDOSCOPY;  Service: Cardiovascular;  Laterality: N/A;   TENDON REPAIR Right 1949   foot- lacerated tendon in arch of foot   THORACENTESIS N/A 10/30/2022   Procedure: THORACENTESIS;  Surgeon: Julian Hy, DO;  Location: [place];  Service: Cardiopulmonary;  Laterality: N/A;   TOTAL HIP ARTHROPLASTY Left 10/05/2021   Procedure: TOTAL HIP ARTHROPLASTY ANTERIOR APPROACH;  Surgeon: Rod Can, MD;  Location: [facility];  Service: Orthopedics;  Laterality: Left;   Past Medical History:  Diagnosis Date   Arthritis    Back pain due to injury    fell off a ladder and fractured back-T12 down (multiple surgeries w/ onging pain)   Chronic kidney disease    "stage 3 kidney failure" improving per pt   Hypothyroidism    Insomnia    Plantar fasciitis    Stomach ulcer ~2000   Stroke [facility])    TIA X2   2001, jan 2017   Vertigo,  benign paroxysmal, bilateral    Vitamin D deficiency    BP 103/73   Pulse 88   Ht '5\' 7"'$  (1.702 m)   Wt 143 lb (64.9 kg)   SpO2 97%   BMI 22.40 kg/m   Opioid Risk Score:   Fall Risk Score:  `1  Depression screen [facility] 2/9     01/08/2023    2:25 PM  Depression screen PHQ 2/9  Decreased Interest 0  Down, Depressed, Hopeless 0  PHQ - 2 Score 0  Altered sleeping 3  Tired, decreased energy 1  Change in appetite 2  Feeling bad or failure about yourself  0  Trouble concentrating 3  Moving slowly or fidgety/restless 2  Suicidal thoughts 0  PHQ-9 Score 11  Difficult doing work/chores Somewhat difficult     Review of Systems  Gastrointestinal:  Positive for constipation.  Psychiatric/Behavioral:  Positive for confusion. The patient is nervous/anxious.   All other systems reviewed and are negative.      Objective:   Physical Exam Vitals and nursing note reviewed.  Constitutional:      Appearance: She is normal weight.  HENT:     Head: Normocephalic and atraumatic.  Eyes:     Extraocular Movements: Extraocular movements intact.     Conjunctiva/sclera: Conjunctivae normal.     Pupils: Pupils are equal, round, and reactive to light.  Musculoskeletal:     Right lower leg: No edema.     Left lower leg: No edema.     Comments: No pain with upper extremity or lower extremity range of motion No's joint swelling in the knees or ankles  Skin:    General: Skin is warm and dry.  Neurological:     Mental Status: She is alert and oriented to person, place, and time.     Comments: Alert oriented to person and place but not time. There is mild left tactile neglect in the upper extremity. No severe visual spatial neglect. Motor strength is 5/5 bilateral deltoid bicep tricep grip 4/5 bilateral hip flexor knee extensor ankle dorsiflexor Negative straight leg raising bilaterally Sensation reported is equal bilateral upper extremities. Gait not tested walker is not with her   Psychiatric:        Mood and Affect: Mood normal.        Behavior: Behavior normal.           Assessment & Plan:   1.  History of vascular dementia  reviewed axial imaging with patient's daughter demonstrating periventricular white matter infarcts which preceded the more recent right MCA infarct.  We discussed her medication management of agitation and anxiety.  Discussed that if she gets sedated on the hydroxyzine 50 mg to cut in half and go down to 25.  [name] we discussed if she has difficulty awakening in the morning to reduce the trazodone down to 50 mg from the 100 mg currently. [name] we discussed that UTI treatment will usually show some improvement in mental state in 2 to 3 days after initiation of antibiotics. We discussed that patient will not be able to live independently in the future and daughter was planning on this.  She will likely need a memory care unit. Daughter is relocating to [facility] with patient and will follow-up with local PCP to arrange services. Physical medicine rehabilitation follow-up on as-needed basis 's request made referral to [facility] neurology, hoping to get a neurology follow-up prior to relocating.

## 2023-01-08 NOTE — Patient Instructions (Signed)
Sorbitol 70% 1-2 oz

## 2023-01-09 ENCOUNTER — Ambulatory Visit: Payer: Medicare HMO | Admitting: General Practice

## 2023-01-09 ENCOUNTER — Emergency Department (HOSPITAL_BASED_OUTPATIENT_CLINIC_OR_DEPARTMENT_OTHER)
Admission: EM | Admit: 2023-01-09 | Discharge: 2023-01-09 | Disposition: A | Discharge status: Home / Self Care | Payer: Medicare HMO | Attending: Emergency Medicine | Admitting: Emergency Medicine

## 2023-01-09 ENCOUNTER — Encounter (HOSPITAL_BASED_OUTPATIENT_CLINIC_OR_DEPARTMENT_OTHER): Payer: Self-pay | Admitting: Emergency Medicine

## 2023-01-09 ENCOUNTER — Other Ambulatory Visit (HOSPITAL_BASED_OUTPATIENT_CLINIC_OR_DEPARTMENT_OTHER): Payer: Self-pay

## 2023-01-09 ENCOUNTER — Other Ambulatory Visit: Payer: Self-pay

## 2023-01-09 DIAGNOSIS — Z7901 Long term (current) use of anticoagulants: Secondary | ICD-10-CM | POA: Diagnosis not present

## 2023-01-09 DIAGNOSIS — K5900 Constipation, unspecified: Secondary | ICD-10-CM | POA: Insufficient documentation

## 2023-01-09 MED ORDER — CALDESENE 81-15 % EX POWD
1.0000 | Freq: Three times a day (TID) | CUTANEOUS | 0 refills | Status: AC
  Filled 2023-01-09: qty 1, fill #0

## 2023-01-09 NOTE — ED Provider Notes (Signed)
[facility] Provider Note   CSN: [id number] Arrival date & time: 01/09/23  1028     History  Chief Complaint  Patient presents with   Constipation    Leslie [name] is a 82 y.o. female.  HPI   82 year old female with past medical history of CVA and constipation presents emergency department with concern for constipation.  Patient has been taking over-the-counter stool softeners without significant relief.  States she has not had a significant bowel movement in about a week.  Went to urgent care earlier today and did pass a large amount of hard stool and feels slightly improved but was referred here for further evaluation.  Denies any abdominal distention.  She does have small amounts of bright red bleeding with stool passage.  Denies any fever.  Has tried suppositories but no enemas.  Home Medications Prior to Admission medications   Medication Sig Start Date End Date Taking? Authorizing Provider  acetaminophen (TYLENOL) 325 MG tablet Take 1-2 tablets (325-650 mg total) by mouth every 4 (four) hours as needed for mild pain. 11/13/22   Love, Ivan Anchors, PA-C  amiodarone (PACERONE) 200 MG tablet Take 1 tablet (200 mg total) by mouth daily. 12/21/22   Angiulli, Lavon Paganini, PA-C  apixaban (ELIQUIS) 5 MG TABS tablet Take 1 tablet (5 mg total) by mouth 2 (two) times daily. 12/21/22 01/25/23  Angiulli, Lavon Paganini, PA-C  ciprofloxacin (CIPRO) 500 MG tablet Take 500 mg by mouth 2 (two) times daily. 01/07/23   [provider]  cyanocobalamin 1000 MCG tablet Take 1 tablet (1,000 mcg total) by mouth daily. 12/21/22   Angiulli, Lavon Paganini, PA-C  escitalopram (LEXAPRO) 5 MG tablet Take 1 tablet (5 mg total) by mouth at bedtime. 12/21/22   Angiulli, Lavon Paganini, PA-C  famotidine (PEPCID) 20 MG tablet Take 1 tablet (20 mg total) by mouth daily. 12/21/22   Angiulli, Lavon Paganini, PA-C  hydrOXYzine (ATARAX) 50 MG tablet Take 50 mg by mouth 3 (three) times daily.  01/07/23   [provider]  levothyroxine (SYNTHROID) 100 MCG tablet Take 1 tablet (100 mcg total) by mouth daily at 6 (six) AM. 12/21/22   Angiulli, Lavon Paganini, PA-C  melatonin 5 MG TABS Take 1 tablet (5 mg total) by mouth at bedtime as needed. 12/26/22   Angiulli, Lavon Paganini, PA-C  midodrine (PROAMATINE) 5 MG tablet Take 1 tablet (5 mg total) by mouth 2 (two) times daily with a meal. 12/21/22   Angiulli, Lavon Paganini, PA-C  polyethylene glycol (MIRALAX / GLYCOLAX) 17 g packet Take 17 g by mouth daily. 12/21/22   Angiulli, Lavon Paganini, PA-C  rosuvastatin (CRESTOR) 10 MG tablet Take 1 tablet (10 mg total) by mouth daily. 12/21/22 01/25/23  Angiulli, Lavon Paganini, PA-C  traZODone (DESYREL) 100 MG tablet Take 100 mg by mouth at bedtime. 01/07/23   [provider]      Allergies    Duloxetine, Voltaren [diclofenac sodium], Lisinopril, Aspirin, Diphenhydramine, Gabapentin, Metoprolol, Ultram [tramadol hcl], and Proton pump inhibitors    Review of Systems   Review of Systems  Constitutional:  Negative for fever.  Respiratory:  Negative for shortness of breath.   Cardiovascular:  Negative for chest pain.  Gastrointestinal:  Positive for abdominal pain, blood in stool and constipation. Negative for abdominal distention, anal bleeding, diarrhea, nausea and vomiting.  Skin:  Negative for rash.  Neurological:  Negative for headaches.    Physical Exam Updated Vital Signs BP 136/70   Pulse 60  Temp 98.4 F (36.9 C) (Oral)   Resp 15   Ht '5\' 8"'$  (1.727 m)   Wt 64.9 kg   SpO2 94%   BMI 21.74 kg/m  Physical Exam Vitals and nursing note reviewed.  Constitutional:      General: She is not in acute distress.    Appearance: Normal appearance.  HENT:     Head: Normocephalic.     Mouth/Throat:     Mouth: Mucous membranes are moist.  Cardiovascular:     Rate and Rhythm: Normal rate.  Pulmonary:     Effort: Pulmonary effort is normal. No respiratory distress.  Abdominal:     General: Bowel sounds  are normal. There is no distension.     Palpations: Abdomen is soft.     Tenderness: There is no abdominal tenderness. There is no guarding or rebound.  Skin:    General: Skin is warm.  Neurological:     Mental Status: She is alert and oriented to person, place, and time. Mental status is at baseline.  Psychiatric:        Mood and Affect: Mood normal.     ED Results / Procedures / Treatments   Labs (all labs ordered are listed, but only abnormal results are displayed) Labs Reviewed - No data to display  EKG None  Radiology DG Ankle 2 Views Right  Result Date: 01/09/2023 CLINICAL DATA:  Recent fall.  Pain and swelling.  Prior surgery. EXAM: RIGHT ANKLE - 2 VIEW COMPARISON:  Left ankle series same day. FINDINGS: Diffuse soft tissue swelling, particularly prominent laterally. Plate and screw fixation about the talonavicular region noted. Hardware intact. Anatomic alignment. Diffuse osteopenia. An old fracture of the distal right fibula cannot be completely excluded. Medial malleolus is intact. No definite acute fracture is noted. Calcaneal spurring. IMPRESSION: 1. Plate and screw fixation about the talonavicular region noted. Hardware intact. Anatomic alignment. 2. Diffuse soft tissue swelling, particularly prominent laterally. Diffuse osteopenia. An old fracture of the distal right fibula cannot be completely excluded. No definite acute fracture is noted. Electronically Signed   By: Marcello Moores  Register [name]   On: 01/09/2023 06:47   DG Ankle 2 Views Left  Result Date: 01/09/2023 CLINICAL DATA:  Recent fall. Pain and swelling. Prior history of surgery. EXAM: LEFT ANKLE - 2 VIEW COMPARISON:  Right ankle series same day. FINDINGS: Diffuse soft tissue swelling particularly prominent laterally noted. Deformity noted of the mid left fibula possibly postsurgical. Nondisplaced fractures of the distal tip of the medial malleolus, age undetermined, is noted. A nondisplaced fracture of the distal aspect of  the fibula cannot be completely excluded. Degenerative change about the left ankle. Calcaneal spurring. IMPRESSION: 1. Diffuse soft tissue swelling particularly prominent laterally. Nondisplaced fracture of the distal tip of the medial malleolus, age undetermined. A nondisplaced of the distal fibula cannot be excluded. 2. Deformity noted of the mid left fibula possibly postsurgical. Degenerative change about the left ankle. Calcaneal spurring. Electronically Signed   By: Marcello Moores  Register [name]   On: 01/09/2023 06:44    Procedures Procedures    Medications Ordered in ED Medications - No data to display  ED Course/ Medical Decision Making/ A&P                             Medical Decision Making Risk OTC drugs.   82 year old female presents emergency department with constipation and lower abdominal/rectal fullness.  Has been trying over-the-counter stool softeners including magnesium  citrate.  Just prior to arrival at urgent care the patient passed a hard ball of stool.  Vital signs are stable.  Abdomen is soft and nondistended, digital rectal exam deferred secondary to discomfort.  I initially ordered an enema however the patient independently had multiple bowel movements on her own consisting of soft light brown stool transitioning to more liquid stool.  She has no evidence of obstruction.  No indication for x-ray.  Patient states that her symptoms have completely resolved, she has no rectal fullness or pain now.  Patient will be seeing her primary doctor later today to discuss a more consistent bowel regimen to avoid episodes of constipation.  Patient feels well and back to baseline and has no other complaints.  Patient at this time appears safe and stable for discharge and close outpatient follow up. Discharge plan and strict return to ED precautions discussed, patient verbalizes understanding and agreement.        Final Clinical Impression(s) / ED Diagnoses Final diagnoses:  None     Rx / DC Orders ED Discharge Orders     None         Lorelle Gibbs, DO 01/09/23 1414

## 2023-01-09 NOTE — ED Notes (Signed)
Discharge instructions, prescription, and follow up care reviewed and explained, pt verbalized understanding and had no further questions on d/c. Pt taken to daughter's POV via wheelchair without incident.

## 2023-01-09 NOTE — ED Triage Notes (Signed)
Pt arrived POV caox4 with daughter. Pt c/o lower abd/pelvic pain for approx 10 days stating she has not been able to have a BM since 10 days ago. Pt advised she is currently on antibiotics for UTI and was recently hospitalized for afib and CVA.

## 2023-01-22 ENCOUNTER — Inpatient Hospital Stay: Payer: Medicare Other | Admitting: Neurology

## 2023-02-15 ENCOUNTER — Other Ambulatory Visit: Payer: Self-pay

## 2023-02-15 NOTE — Patient Outreach (Signed)
First telephone outreach attempt to obtain mRS. No answer. Left message for returned call.  Leslie [name] [facility]-Care Management Assistant [phone]  

## 2023-02-15 NOTE — Patient Outreach (Signed)
Daughter returned called, obtained mRS was successfully completed. MRS=5  [place] Care Management Assistant [phone]

## 2023-08-01 IMAGING — CR DG KNEE 3 VIEWS*L*
3 series · 3 of 3 positions shown · non-contrast
Comparison: None.

CLINICAL DATA: Left knee mass. Knot on lateral aspect of the left
knee for several months. Not painful. No known injury. Area marked
by BB.

EXAM:
LEFT KNEE - 3 VIEW

[w knee lat left (1 of 2)]
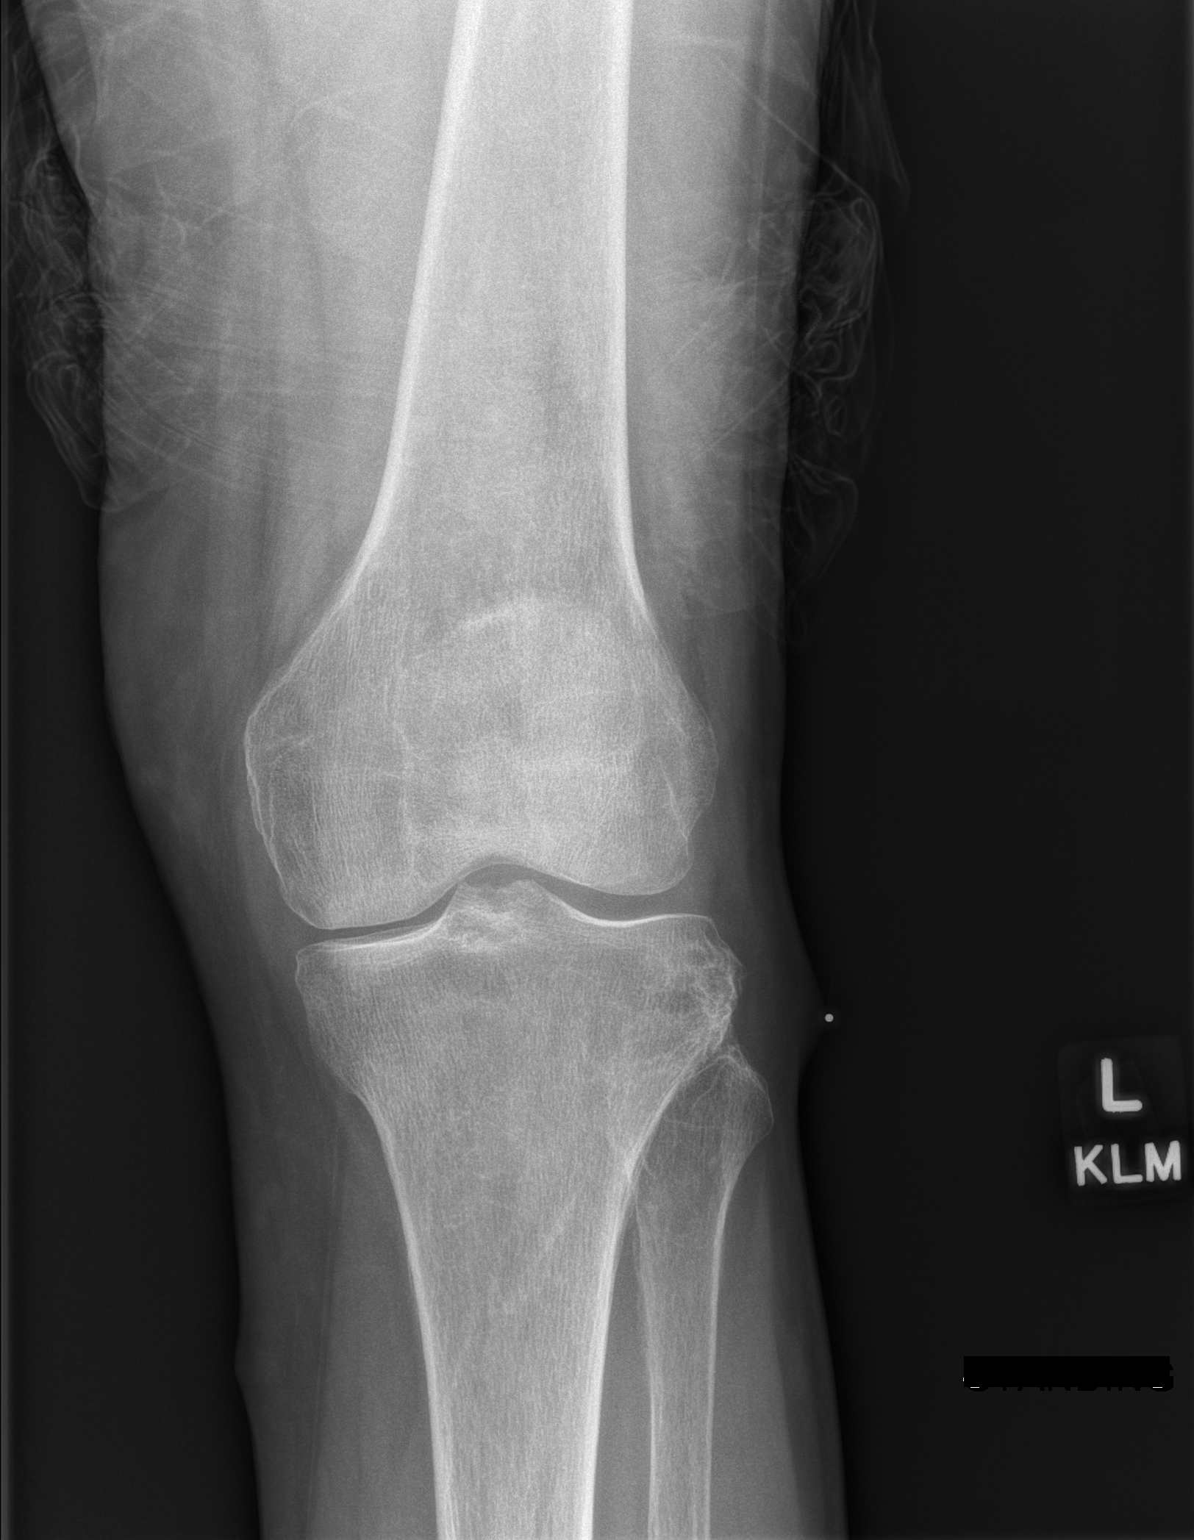

[w knee lat left (2 of 2)]
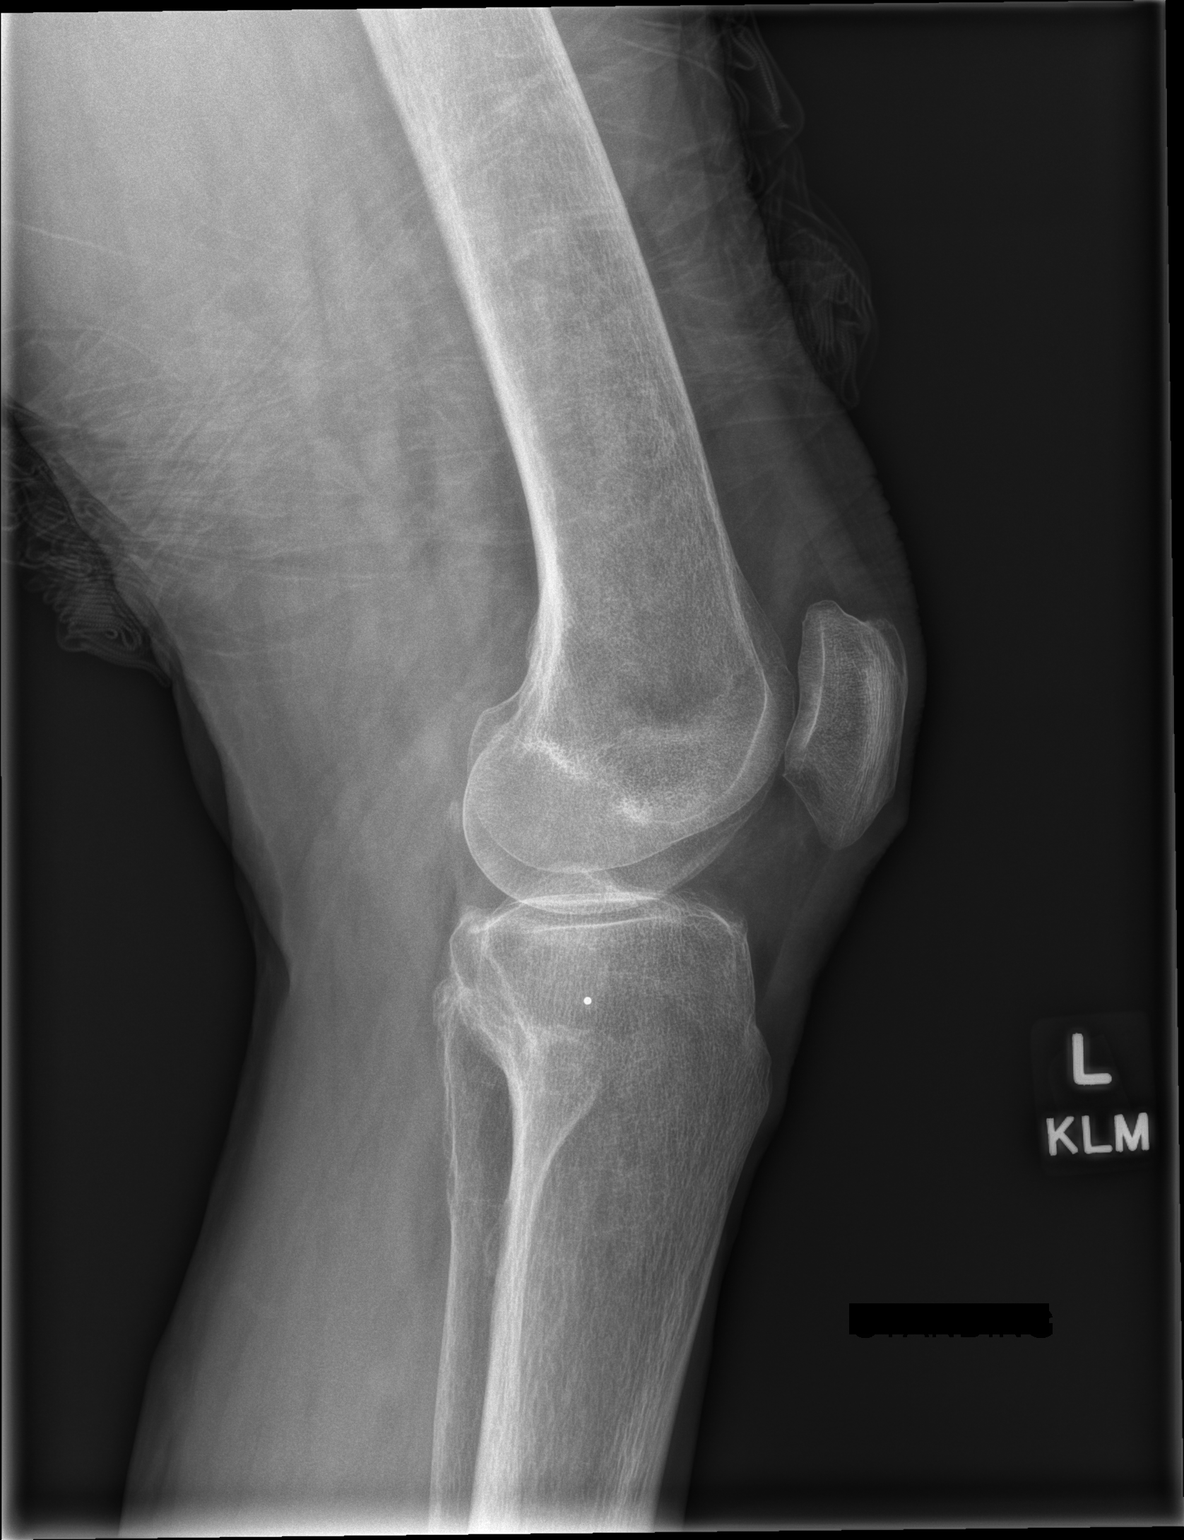

[x knee sunrise left]
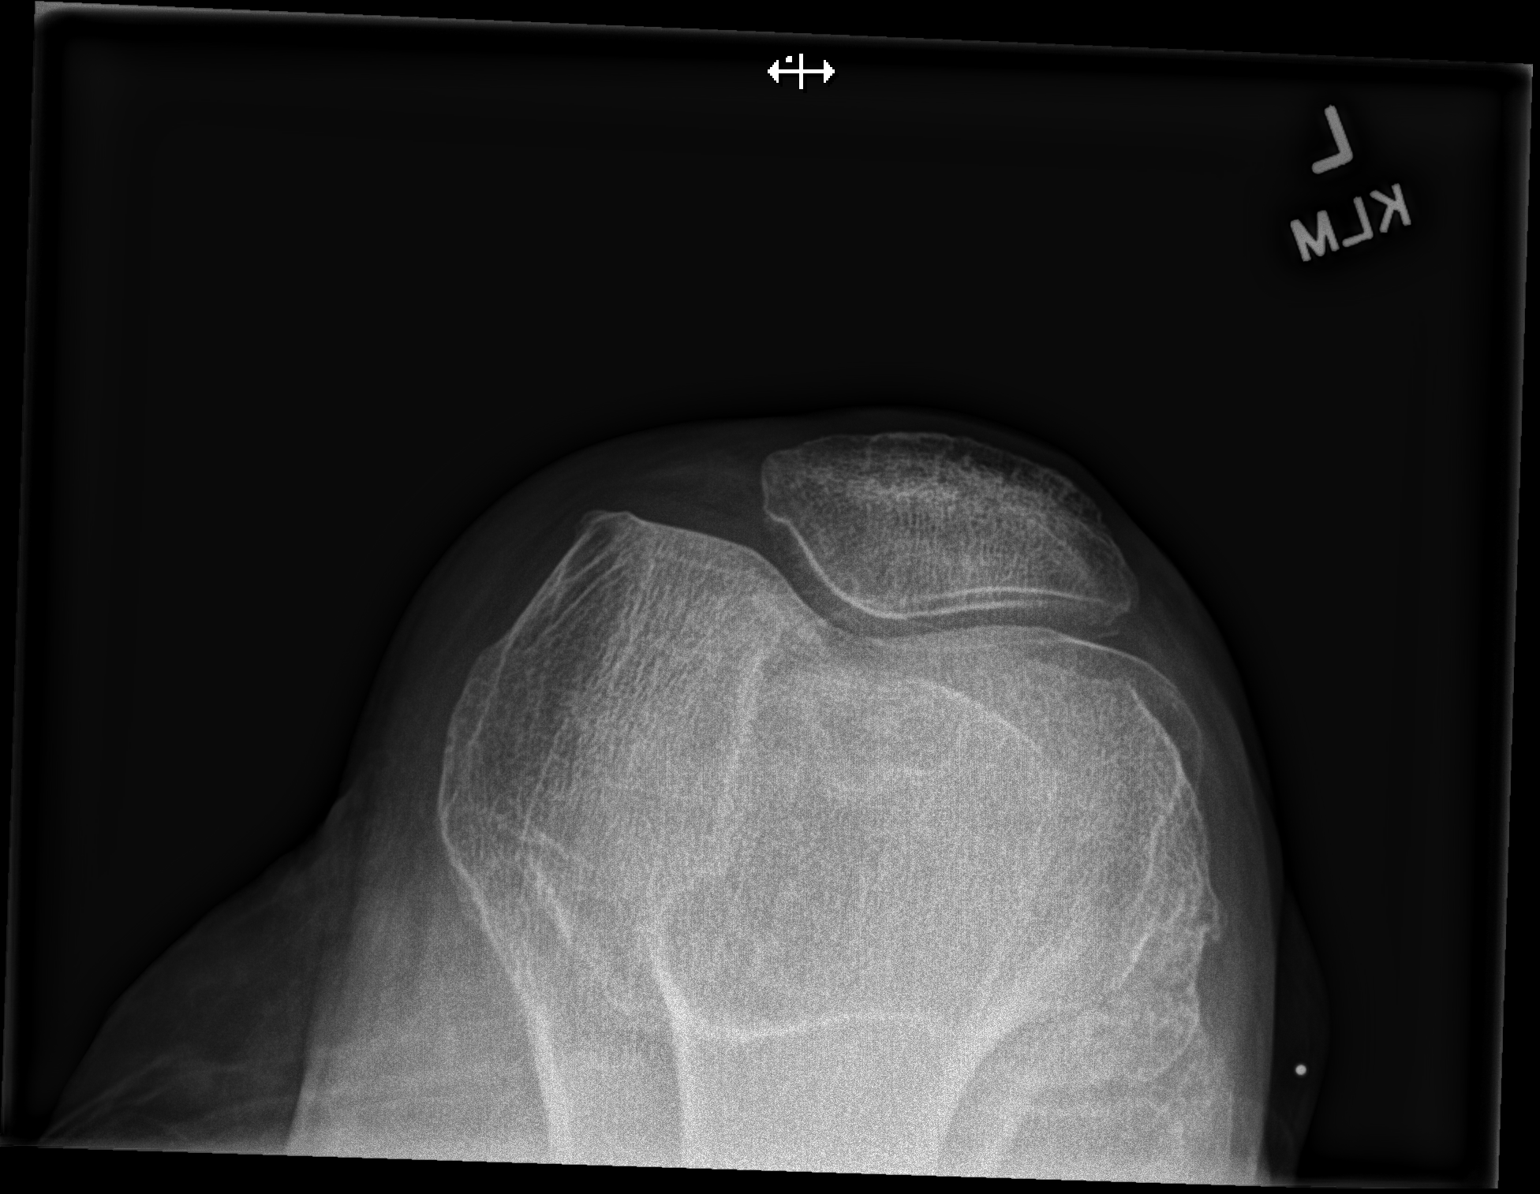

[3 of 3 positions shown; findings below may reference images not displayed]

FINDINGS: There is diffuse decreased bone mineralization. Moderate medial
compartment joint space narrowing. There is a region measuring up to
18 mm of lucent likely cystic changes within the far lateral and
likely posterior aspect of the lateral tibial plateau at the
proximal tibiofibular joint, likely degenerative. No joint effusion.
No acute fracture or dislocation. Mild patellofemoral joint space
narrowing.

A BB marker is seen on the skin lateral to the proximal tibiofibular
joint. There is smooth laterally directed convexity of the soft
tissues in this region measuring up to approximately 9 mm in
depth/transverse knee dimension. No associated calcifications or
abnormal radiopaque densities in this region.
IMPRESSION: Lateral to the proximal tibiofibular joint there is a soft tissue
convexity corresponding to the patient's reported "knot/mass. "There
appear to be degenerative cystic changes within the lateral tibia at
the proximal tibiofibular joint. Question a degenerative ganglion,
however the soft tissue abnormality is entirely nonspecific and may
represent a soft tissue mass. Recommend clinical correlation.
